# Patient Record
Sex: Female | Born: 1965 | Race: Black or African American | Hispanic: No | Marital: Single | Smoking: Former smoker
Health system: Southern US, Community
[De-identification: ages and names within clinical notes are randomized; demographics above are authoritative.]

## PROBLEM LIST (undated history)

## (undated) DIAGNOSIS — T7840XA Allergy, unspecified, initial encounter: Secondary | ICD-10-CM

## (undated) DIAGNOSIS — K219 Gastro-esophageal reflux disease without esophagitis: Secondary | ICD-10-CM

## (undated) DIAGNOSIS — E039 Hypothyroidism, unspecified: Secondary | ICD-10-CM

## (undated) DIAGNOSIS — D509 Iron deficiency anemia, unspecified: Secondary | ICD-10-CM

## (undated) DIAGNOSIS — J45909 Unspecified asthma, uncomplicated: Secondary | ICD-10-CM

## (undated) DIAGNOSIS — E119 Type 2 diabetes mellitus without complications: Secondary | ICD-10-CM

## (undated) DIAGNOSIS — M1990 Unspecified osteoarthritis, unspecified site: Secondary | ICD-10-CM

## (undated) DIAGNOSIS — D493 Neoplasm of unspecified behavior of breast: Secondary | ICD-10-CM

## (undated) DIAGNOSIS — K449 Diaphragmatic hernia without obstruction or gangrene: Secondary | ICD-10-CM

## (undated) DIAGNOSIS — E785 Hyperlipidemia, unspecified: Secondary | ICD-10-CM

## (undated) DIAGNOSIS — R079 Chest pain, unspecified: Secondary | ICD-10-CM

## (undated) DIAGNOSIS — I10 Essential (primary) hypertension: Secondary | ICD-10-CM

## (undated) DIAGNOSIS — G4730 Sleep apnea, unspecified: Secondary | ICD-10-CM

## (undated) DIAGNOSIS — K226 Gastro-esophageal laceration-hemorrhage syndrome: Secondary | ICD-10-CM

## (undated) HISTORY — DX: Hyperlipidemia, unspecified: E78.5

## (undated) HISTORY — DX: Iron deficiency anemia, unspecified: D50.9

## (undated) HISTORY — PX: UPPER GASTROINTESTINAL ENDOSCOPY: SHX188

## (undated) HISTORY — DX: Hypothyroidism, unspecified: E03.9

## (undated) HISTORY — DX: Sleep apnea, unspecified: G47.30

## (undated) HISTORY — DX: Unspecified osteoarthritis, unspecified site: M19.90

## (undated) HISTORY — DX: Allergy, unspecified, initial encounter: T78.40XA

## (undated) HISTORY — DX: Gastro-esophageal laceration-hemorrhage syndrome: K22.6

## (undated) HISTORY — PX: OTHER SURGICAL HISTORY: SHX169

## (undated) HISTORY — PX: COLONOSCOPY: SHX174

## (undated) HISTORY — DX: Diaphragmatic hernia without obstruction or gangrene: K44.9

## (undated) HISTORY — DX: Type 2 diabetes mellitus without complications: E11.9

## (undated) HISTORY — PX: THYROIDECTOMY: SHX17

## (undated) HISTORY — DX: Essential (primary) hypertension: I10

## (undated) HISTORY — DX: Unspecified asthma, uncomplicated: J45.909

## (undated) HISTORY — PX: FRACTURE SURGERY: SHX138

## (undated) HISTORY — PX: BREAST SURGERY: SHX581

## (undated) HISTORY — PX: REDUCTION MAMMAPLASTY: SUR839

## (undated) HISTORY — DX: Gastro-esophageal reflux disease without esophagitis: K21.9

---

## 1984-06-04 HISTORY — PX: BREAST REDUCTION SURGERY: SHX8

## 1985-06-04 HISTORY — PX: TUBAL LIGATION: SHX77

## 1997-09-25 ENCOUNTER — Emergency Department (HOSPITAL_COMMUNITY): Admission: EM | Admit: 1997-09-25 | Discharge: 1997-09-25 | Payer: Self-pay | Admitting: Emergency Medicine

## 1997-09-27 ENCOUNTER — Emergency Department (HOSPITAL_COMMUNITY): Admission: EM | Admit: 1997-09-27 | Discharge: 1997-09-27 | Payer: Self-pay | Admitting: Emergency Medicine

## 1998-09-13 ENCOUNTER — Ambulatory Visit (HOSPITAL_BASED_OUTPATIENT_CLINIC_OR_DEPARTMENT_OTHER): Admission: RE | Admit: 1998-09-13 | Discharge: 1998-09-13 | Payer: Self-pay | Admitting: Orthopedic Surgery

## 1999-05-05 ENCOUNTER — Encounter: Admission: RE | Admit: 1999-05-05 | Discharge: 1999-05-05 | Payer: Self-pay | Admitting: Family Medicine

## 1999-05-05 ENCOUNTER — Encounter: Payer: Self-pay | Admitting: Family Medicine

## 1999-06-05 HISTORY — PX: CARPAL TUNNEL RELEASE: SHX101

## 1999-06-09 ENCOUNTER — Encounter: Admission: RE | Admit: 1999-06-09 | Discharge: 1999-06-09 | Payer: Self-pay | Admitting: Family Medicine

## 1999-06-09 ENCOUNTER — Encounter: Payer: Self-pay | Admitting: Family Medicine

## 1999-08-29 ENCOUNTER — Encounter: Admission: RE | Admit: 1999-08-29 | Discharge: 1999-08-29 | Payer: Self-pay | Admitting: Internal Medicine

## 2000-04-02 ENCOUNTER — Encounter: Admission: RE | Admit: 2000-04-02 | Discharge: 2000-04-02 | Payer: Self-pay | Admitting: Obstetrics & Gynecology

## 2000-04-04 ENCOUNTER — Encounter: Payer: Self-pay | Admitting: Family Medicine

## 2000-04-04 ENCOUNTER — Ambulatory Visit (HOSPITAL_COMMUNITY): Admission: RE | Admit: 2000-04-04 | Discharge: 2000-04-04 | Payer: Self-pay | Admitting: Family Medicine

## 2000-06-25 ENCOUNTER — Encounter: Admission: RE | Admit: 2000-06-25 | Discharge: 2000-06-25 | Payer: Self-pay | Admitting: Obstetrics & Gynecology

## 2000-07-09 ENCOUNTER — Encounter: Admission: RE | Admit: 2000-07-09 | Discharge: 2000-07-09 | Payer: Self-pay | Admitting: Obstetrics & Gynecology

## 2000-07-30 ENCOUNTER — Encounter: Admission: RE | Admit: 2000-07-30 | Discharge: 2000-07-30 | Payer: Self-pay | Admitting: Obstetrics & Gynecology

## 2000-07-30 ENCOUNTER — Other Ambulatory Visit: Admission: RE | Admit: 2000-07-30 | Discharge: 2000-07-30 | Payer: Self-pay | Admitting: Obstetrics & Gynecology

## 2000-08-12 ENCOUNTER — Emergency Department (HOSPITAL_COMMUNITY): Admission: EM | Admit: 2000-08-12 | Discharge: 2000-08-12 | Payer: Self-pay

## 2000-08-12 ENCOUNTER — Encounter: Payer: Self-pay | Admitting: Emergency Medicine

## 2000-10-15 ENCOUNTER — Encounter: Admission: RE | Admit: 2000-10-15 | Discharge: 2000-10-15 | Payer: Self-pay | Admitting: Obstetrics & Gynecology

## 2000-12-31 ENCOUNTER — Ambulatory Visit (HOSPITAL_COMMUNITY): Admission: RE | Admit: 2000-12-31 | Discharge: 2000-12-31 | Payer: Self-pay | Admitting: Obstetrics and Gynecology

## 2000-12-31 ENCOUNTER — Encounter (INDEPENDENT_AMBULATORY_CARE_PROVIDER_SITE_OTHER): Payer: Self-pay

## 2002-03-13 ENCOUNTER — Encounter: Payer: Self-pay | Admitting: Family Medicine

## 2002-03-13 ENCOUNTER — Encounter: Admission: RE | Admit: 2002-03-13 | Discharge: 2002-03-13 | Payer: Self-pay | Admitting: Family Medicine

## 2003-02-20 ENCOUNTER — Emergency Department (HOSPITAL_COMMUNITY): Admission: EM | Admit: 2003-02-20 | Discharge: 2003-02-20 | Payer: Self-pay | Admitting: Emergency Medicine

## 2003-03-13 ENCOUNTER — Emergency Department (HOSPITAL_COMMUNITY): Admission: EM | Admit: 2003-03-13 | Discharge: 2003-03-13 | Payer: Self-pay

## 2003-03-25 ENCOUNTER — Other Ambulatory Visit: Admission: RE | Admit: 2003-03-25 | Discharge: 2003-03-25 | Payer: Self-pay | Admitting: Obstetrics and Gynecology

## 2003-03-25 ENCOUNTER — Encounter: Payer: Self-pay | Admitting: Internal Medicine

## 2003-03-25 LAB — CONVERTED CEMR LAB

## 2003-04-05 DIAGNOSIS — K226 Gastro-esophageal laceration-hemorrhage syndrome: Secondary | ICD-10-CM

## 2003-04-05 HISTORY — DX: Gastro-esophageal laceration-hemorrhage syndrome: K22.6

## 2003-09-16 ENCOUNTER — Emergency Department (HOSPITAL_COMMUNITY): Admission: EM | Admit: 2003-09-16 | Discharge: 2003-09-16 | Payer: Self-pay | Admitting: Emergency Medicine

## 2004-03-28 ENCOUNTER — Other Ambulatory Visit: Admission: RE | Admit: 2004-03-28 | Discharge: 2004-03-28 | Payer: Self-pay | Admitting: Obstetrics and Gynecology

## 2004-04-17 ENCOUNTER — Ambulatory Visit: Payer: Self-pay | Admitting: Oncology

## 2004-04-20 ENCOUNTER — Encounter: Admission: RE | Admit: 2004-04-20 | Discharge: 2004-04-20 | Payer: Self-pay | Admitting: Obstetrics and Gynecology

## 2004-05-04 ENCOUNTER — Ambulatory Visit: Payer: Self-pay | Admitting: Internal Medicine

## 2004-07-17 ENCOUNTER — Ambulatory Visit: Payer: Self-pay | Admitting: Oncology

## 2004-08-30 ENCOUNTER — Ambulatory Visit: Payer: Self-pay | Admitting: Internal Medicine

## 2004-09-29 ENCOUNTER — Ambulatory Visit: Payer: Self-pay | Admitting: Internal Medicine

## 2004-10-28 ENCOUNTER — Emergency Department (HOSPITAL_COMMUNITY): Admission: EM | Admit: 2004-10-28 | Discharge: 2004-10-28 | Payer: Self-pay | Admitting: Emergency Medicine

## 2004-11-06 ENCOUNTER — Ambulatory Visit: Payer: Self-pay | Admitting: Internal Medicine

## 2004-11-15 ENCOUNTER — Ambulatory Visit: Payer: Self-pay | Admitting: *Deleted

## 2004-12-25 ENCOUNTER — Emergency Department (HOSPITAL_COMMUNITY): Admission: EM | Admit: 2004-12-25 | Discharge: 2004-12-25 | Payer: Self-pay | Admitting: Emergency Medicine

## 2005-04-04 ENCOUNTER — Inpatient Hospital Stay (HOSPITAL_COMMUNITY): Admission: AD | Admit: 2005-04-04 | Discharge: 2005-04-04 | Payer: Self-pay | Admitting: Obstetrics and Gynecology

## 2005-04-24 ENCOUNTER — Other Ambulatory Visit: Admission: RE | Admit: 2005-04-24 | Discharge: 2005-04-24 | Payer: Self-pay | Admitting: Obstetrics and Gynecology

## 2005-11-26 ENCOUNTER — Ambulatory Visit: Payer: Self-pay | Admitting: Internal Medicine

## 2006-03-25 ENCOUNTER — Ambulatory Visit: Payer: Self-pay | Admitting: Internal Medicine

## 2006-05-27 ENCOUNTER — Ambulatory Visit: Payer: Self-pay | Admitting: Internal Medicine

## 2006-05-30 ENCOUNTER — Encounter: Admission: RE | Admit: 2006-05-30 | Discharge: 2006-05-30 | Payer: Self-pay | Admitting: Obstetrics and Gynecology

## 2006-08-29 ENCOUNTER — Ambulatory Visit: Payer: Self-pay | Admitting: Internal Medicine

## 2006-08-29 LAB — CONVERTED CEMR LAB
ALT: 12 units/L (ref 0–40)
AST: 17 units/L (ref 0–37)
Albumin: 3.5 g/dL (ref 3.5–5.2)
Alkaline Phosphatase: 44 units/L (ref 39–117)
BUN: 14 mg/dL (ref 6–23)
Basophils Absolute: 0 10*3/uL (ref 0.0–0.1)
Basophils Relative: 0.8 % (ref 0.0–1.0)
Bilirubin Urine: NEGATIVE
Bilirubin, Direct: 0.1 mg/dL (ref 0.0–0.3)
CO2: 25 meq/L (ref 19–32)
Calcium: 8.5 mg/dL (ref 8.4–10.5)
Chloride: 107 meq/L (ref 96–112)
Cholesterol: 219 mg/dL (ref 0–200)
Creatinine, Ser: 0.7 mg/dL (ref 0.4–1.2)
Direct LDL: 165.4 mg/dL
Eosinophils Absolute: 0 10*3/uL (ref 0.0–0.6)
Eosinophils Relative: 1.1 % (ref 0.0–5.0)
Folate: 7.6 ng/mL
GFR calc Af Amer: 118 mL/min
GFR calc non Af Amer: 98 mL/min
Glucose, Bld: 95 mg/dL (ref 70–99)
H Pylori IgG: NEGATIVE
HCT: 24.1 % — ABNORMAL LOW (ref 36.0–46.0)
HDL: 41 mg/dL (ref >39.0)
Hemoglobin: 7.3 g/dL — CL (ref 12.0–15.0)
Iron: 24 ug/dL — ABNORMAL LOW (ref 42–145)
Ketones, ur: NEGATIVE mg/dL
Leukocytes, UA: NEGATIVE
Lymphocytes Relative: 38.7 % (ref 12.0–46.0)
MCHC: 30.1 g/dL (ref 30.0–36.0)
MCV: 63.6 fL — ABNORMAL LOW (ref 78.0–100.0)
Monocytes Absolute: 0.3 10*3/uL (ref 0.2–0.7)
Monocytes Relative: 6.8 % (ref 3.0–11.0)
Neutro Abs: 2.5 10*3/uL (ref 1.4–7.7)
Neutrophils Relative %: 52.6 % (ref 43.0–77.0)
Nitrite: NEGATIVE
Platelets: 192 10*3/uL (ref 150–400)
Potassium: 3.8 meq/L (ref 3.5–5.1)
RBC: 3.78 M/uL — ABNORMAL LOW (ref 3.87–5.11)
RDW: 17.6 % — ABNORMAL HIGH (ref 11.5–14.6)
Saturation Ratios: 4.1 % — ABNORMAL LOW (ref 20.0–50.0)
Sodium: 137 meq/L (ref 135–145)
Specific Gravity, Urine: >=1.03 (ref 1.000–1.03)
TSH: 12.07 microintl units/mL — ABNORMAL HIGH (ref 0.35–5.50)
Total Bilirubin: 0.6 mg/dL (ref 0.3–1.2)
Total CHOL/HDL Ratio: 5.3
Total Protein, Urine: NEGATIVE mg/dL
Total Protein: 7.7 g/dL (ref 6.0–8.3)
Transferrin: 422.8 mg/dL — ABNORMAL HIGH (ref >=212.0)
Triglycerides: 76 mg/dL (ref 0–149)
Urine Glucose: NEGATIVE mg/dL
Urobilinogen, UA: 0.2 (ref 0.0–1.0)
VLDL: 15 mg/dL (ref 0–40)
Vitamin B-12: 834 pg/mL (ref 211–911)
WBC: 4.5 10*3/uL (ref 4.5–10.5)
pH: 5.5 (ref 5.0–8.0)

## 2006-10-01 ENCOUNTER — Ambulatory Visit: Payer: Self-pay | Admitting: Internal Medicine

## 2006-10-01 LAB — CONVERTED CEMR LAB
Basophils Absolute: 0.1 10*3/uL (ref 0.0–0.1)
Basophils Relative: 3.2 % — ABNORMAL HIGH (ref 0.0–1.0)
Eosinophils Absolute: 0.1 10*3/uL (ref 0.0–0.6)
Eosinophils Relative: 1.4 % (ref 0.0–5.0)
HCT: 29.8 % — ABNORMAL LOW (ref 36.0–46.0)
Hemoglobin: 9.2 g/dL — ABNORMAL LOW (ref 12.0–15.0)
Iron: 43 ug/dL (ref 42–145)
Lymphocytes Relative: 41.2 % (ref 12.0–46.0)
MCHC: 31 g/dL (ref 30.0–36.0)
MCV: 68.3 fL — ABNORMAL LOW (ref 78.0–100.0)
Monocytes Absolute: 0.3 10*3/uL (ref 0.2–0.7)
Monocytes Relative: 7.1 % (ref 3.0–11.0)
Neutro Abs: 2 10*3/uL (ref 1.4–7.7)
Neutrophils Relative %: 47.1 % (ref 43.0–77.0)
Platelets: 227 10*3/uL (ref 150–400)
RBC: 4.36 M/uL (ref 3.87–5.11)
RDW: 24.5 % — ABNORMAL HIGH (ref 11.5–14.6)
Saturation Ratios: 8.8 % — ABNORMAL LOW (ref 20.0–50.0)
Transferrin: 347.3 mg/dL (ref >=212.0)
WBC: 4.3 10*3/uL — ABNORMAL LOW (ref 4.5–10.5)

## 2007-01-30 ENCOUNTER — Encounter: Payer: Self-pay | Admitting: Internal Medicine

## 2007-01-30 DIAGNOSIS — D509 Iron deficiency anemia, unspecified: Secondary | ICD-10-CM

## 2007-01-30 DIAGNOSIS — E039 Hypothyroidism, unspecified: Secondary | ICD-10-CM

## 2007-01-30 DIAGNOSIS — J45909 Unspecified asthma, uncomplicated: Secondary | ICD-10-CM | POA: Insufficient documentation

## 2007-01-30 DIAGNOSIS — E785 Hyperlipidemia, unspecified: Secondary | ICD-10-CM

## 2007-01-30 HISTORY — DX: Iron deficiency anemia, unspecified: D50.9

## 2007-01-30 HISTORY — DX: Hypothyroidism, unspecified: E03.9

## 2007-01-30 HISTORY — DX: Unspecified asthma, uncomplicated: J45.909

## 2007-01-31 DIAGNOSIS — E0590 Thyrotoxicosis, unspecified without thyrotoxic crisis or storm: Secondary | ICD-10-CM | POA: Insufficient documentation

## 2007-01-31 DIAGNOSIS — K219 Gastro-esophageal reflux disease without esophagitis: Secondary | ICD-10-CM

## 2007-01-31 HISTORY — DX: Gastro-esophageal reflux disease without esophagitis: K21.9

## 2007-02-08 ENCOUNTER — Ambulatory Visit: Payer: Self-pay | Admitting: Family Medicine

## 2007-02-12 ENCOUNTER — Ambulatory Visit: Payer: Self-pay | Admitting: Internal Medicine

## 2007-02-12 LAB — CONVERTED CEMR LAB
Basophils Absolute: 0 10*3/uL (ref 0.0–0.1)
Basophils Relative: 0.5 % (ref 0.0–1.0)
Eosinophils Absolute: 0.1 10*3/uL (ref 0.0–0.6)
Eosinophils Relative: 1.3 % (ref 0.0–5.0)
Folate: 8.7 ng/mL
HCT: 25.9 % — ABNORMAL LOW (ref 36.0–46.0)
Hemoglobin: 8.2 g/dL — ABNORMAL LOW (ref 12.0–15.0)
Iron: 22 ug/dL — ABNORMAL LOW (ref 42–145)
Lymphocytes Relative: 36.9 % (ref 12.0–46.0)
MCHC: 31.7 g/dL (ref 30.0–36.0)
MCV: 68.6 fL — ABNORMAL LOW (ref 78.0–100.0)
Monocytes Absolute: 0.5 10*3/uL (ref 0.2–0.7)
Monocytes Relative: 9.9 % (ref 3.0–11.0)
Neutro Abs: 2.4 10*3/uL (ref 1.4–7.7)
Neutrophils Relative %: 51.4 % (ref 43.0–77.0)
Platelets: 221 10*3/uL (ref 150–400)
RBC: 3.78 M/uL — ABNORMAL LOW (ref 3.87–5.11)
RDW: 17.1 % — ABNORMAL HIGH (ref 11.5–14.6)
Saturation Ratios: 4.3 % — ABNORMAL LOW (ref 20.0–50.0)
TSH: 6.27 microintl units/mL — ABNORMAL HIGH (ref 0.35–5.50)
Transferrin: 369.5 mg/dL — ABNORMAL HIGH (ref >=212.0)
Vitamin B-12: 576 pg/mL (ref 211–911)
WBC: 4.8 10*3/uL (ref 4.5–10.5)

## 2007-07-31 ENCOUNTER — Emergency Department (HOSPITAL_COMMUNITY): Admission: EM | Admit: 2007-07-31 | Discharge: 2007-07-31 | Payer: Self-pay | Admitting: Emergency Medicine

## 2007-08-26 ENCOUNTER — Telehealth: Payer: Self-pay | Admitting: Internal Medicine

## 2007-09-11 ENCOUNTER — Ambulatory Visit: Payer: Self-pay | Admitting: Oncology

## 2007-09-17 ENCOUNTER — Encounter: Payer: Self-pay | Admitting: Internal Medicine

## 2007-09-17 LAB — CBC WITH DIFFERENTIAL/PLATELET
BASO%: 0.1 % (ref 0.0–2.0)
Basophils Absolute: 0 10*3/uL (ref 0.0–0.1)
EOS%: 1.5 % (ref 0.0–7.0)
Eosinophils Absolute: 0.1 10*3/uL (ref 0.0–0.5)
HCT: 27.6 % — ABNORMAL LOW (ref 34.8–46.6)
HGB: 9 g/dL — ABNORMAL LOW (ref 11.6–15.9)
LYMPH%: 36.7 % (ref 14.0–48.0)
MCH: 22.8 pg — ABNORMAL LOW (ref 26.0–34.0)
MCHC: 32.7 g/dL (ref 32.0–36.0)
MCV: 69.5 fL — ABNORMAL LOW (ref 81.0–101.0)
MONO#: 0.3 10*3/uL (ref 0.1–0.9)
MONO%: 7.5 % (ref 0.0–13.0)
NEUT#: 2.2 10*3/uL (ref 1.5–6.5)
NEUT%: 54.2 % (ref 39.6–76.8)
Platelets: 297 10*3/uL (ref 145–400)
RBC: 3.97 10*6/uL (ref 3.70–5.32)
RDW: 16.1 % — ABNORMAL HIGH (ref 11.3–14.5)
WBC: 4 10*3/uL (ref 3.9–10.0)
lymph#: 1.5 10*3/uL (ref 0.9–3.3)

## 2007-09-17 LAB — COMPREHENSIVE METABOLIC PANEL
Alkaline Phosphatase: 51 U/L (ref 39–117)
BUN: 13 mg/dL (ref 6–23)
Glucose, Bld: 112 mg/dL — ABNORMAL HIGH (ref 70–99)
Total Bilirubin: 0.2 mg/dL — ABNORMAL LOW (ref 0.3–1.2)

## 2007-09-17 LAB — IRON AND TIBC
%SAT: 3 % — ABNORMAL LOW (ref 20–55)
Iron: 11 ug/dL — ABNORMAL LOW (ref 42–145)
TIBC: 440 ug/dL (ref 250–470)
UIBC: 429 ug/dL

## 2007-09-17 LAB — FERRITIN: Ferritin: 14 ng/mL (ref 10–291)

## 2007-11-11 ENCOUNTER — Ambulatory Visit: Payer: Self-pay | Admitting: Oncology

## 2007-11-21 ENCOUNTER — Encounter: Payer: Self-pay | Admitting: Internal Medicine

## 2007-11-21 LAB — IRON AND TIBC
%SAT: 6 % — ABNORMAL LOW (ref 20–55)
Iron: 21 ug/dL — ABNORMAL LOW (ref 42–145)
UIBC: 351 ug/dL

## 2007-11-21 LAB — CBC WITH DIFFERENTIAL/PLATELET
BASO%: 1.1 % (ref 0.0–2.0)
Eosinophils Absolute: 0 10*3/uL (ref 0.0–0.5)
MCHC: 32.6 g/dL (ref 32.0–36.0)
MONO#: 0.4 10*3/uL (ref 0.1–0.9)
NEUT#: 1.8 10*3/uL (ref 1.5–6.5)
RBC: 4.1 10*6/uL (ref 3.70–5.32)
RDW: 17.6 % — ABNORMAL HIGH (ref 11.3–14.5)
WBC: 4.3 10*3/uL (ref 3.9–10.0)
lymph#: 2 10*3/uL (ref 0.9–3.3)

## 2007-11-21 LAB — COMPREHENSIVE METABOLIC PANEL
AST: 19 U/L (ref 0–37)
Alkaline Phosphatase: 55 U/L (ref 39–117)
Glucose, Bld: 81 mg/dL (ref 70–99)
Potassium: 4.5 mEq/L (ref 3.5–5.3)
Sodium: 140 mEq/L (ref 135–145)
Total Bilirubin: 0.2 mg/dL — ABNORMAL LOW (ref 0.3–1.2)
Total Protein: 7.8 g/dL (ref 6.0–8.3)

## 2007-12-17 ENCOUNTER — Ambulatory Visit: Payer: Self-pay | Admitting: Oncology

## 2008-01-13 ENCOUNTER — Encounter: Payer: Self-pay | Admitting: Internal Medicine

## 2008-03-18 ENCOUNTER — Ambulatory Visit: Payer: Self-pay | Admitting: Oncology

## 2008-03-22 ENCOUNTER — Encounter: Payer: Self-pay | Admitting: Internal Medicine

## 2008-03-22 LAB — CBC WITH DIFFERENTIAL/PLATELET
Basophils Absolute: 0 10*3/uL (ref 0.0–0.1)
EOS%: 1.2 % (ref 0.0–7.0)
Eosinophils Absolute: 0.1 10*3/uL (ref 0.0–0.5)
HGB: 11.4 g/dL — ABNORMAL LOW (ref 11.6–15.9)
MCH: 28.9 pg (ref 26.0–34.0)
RDW: 13.7 % (ref 11.3–14.5)
WBC: 4.7 10*3/uL (ref 3.9–10.0)
lymph#: 1.8 10*3/uL (ref 0.9–3.3)

## 2008-03-29 LAB — COMPREHENSIVE METABOLIC PANEL
ALT: 15 U/L (ref 0–35)
AST: 17 U/L (ref 0–37)
Albumin: 4.1 g/dL (ref 3.5–5.2)
Alkaline Phosphatase: 49 U/L (ref 39–117)
Glucose, Bld: 94 mg/dL (ref 70–99)
Potassium: 4.2 mEq/L (ref 3.5–5.3)
Sodium: 139 mEq/L (ref 135–145)
Total Protein: 7.3 g/dL (ref 6.0–8.3)

## 2008-03-29 LAB — VON WILLEBRAND PANEL
Factor-VIII Activity: 152 % — ABNORMAL HIGH (ref 50–150)
Ristocetin-Cofactor: 122 % (ref 50–150)
Von Willebrand Ag: 128 % normal (ref 61–164)

## 2008-03-29 LAB — IRON AND TIBC: Iron: 75 ug/dL (ref 42–145)

## 2008-03-29 LAB — LACTATE DEHYDROGENASE: LDH: 180 U/L (ref 94–250)

## 2008-05-19 ENCOUNTER — Ambulatory Visit: Payer: Self-pay | Admitting: Oncology

## 2008-05-21 LAB — CBC WITH DIFFERENTIAL/PLATELET
Basophils Absolute: 0 10*3/uL (ref 0.0–0.1)
Eosinophils Absolute: 0 10*3/uL (ref 0.0–0.5)
HCT: 35.6 % (ref 34.8–46.6)
HGB: 12.1 g/dL (ref 11.6–15.9)
MCH: 29.3 pg (ref 26.0–34.0)
MONO#: 0.2 10*3/uL (ref 0.1–0.9)
NEUT#: 2.2 10*3/uL (ref 1.5–6.5)
NEUT%: 54.7 % (ref 39.6–76.8)
RDW: 12.7 % (ref 11.3–14.5)
lymph#: 1.6 10*3/uL (ref 0.9–3.3)

## 2008-05-21 LAB — IRON AND TIBC
Iron: 56 ug/dL (ref 42–145)
UIBC: 262 ug/dL

## 2008-05-21 LAB — COMPREHENSIVE METABOLIC PANEL
AST: 16 U/L (ref 0–37)
Albumin: 4.2 g/dL (ref 3.5–5.2)
BUN: 12 mg/dL (ref 6–23)
CO2: 24 mEq/L (ref 19–32)
Calcium: 8.6 mg/dL (ref 8.4–10.5)
Chloride: 102 mEq/L (ref 96–112)
Creatinine, Ser: 0.79 mg/dL (ref 0.40–1.20)
Glucose, Bld: 101 mg/dL — ABNORMAL HIGH (ref 70–99)
Potassium: 3.7 mEq/L (ref 3.5–5.3)

## 2008-05-21 LAB — FERRITIN: Ferritin: 347 ng/mL — ABNORMAL HIGH (ref 10–291)

## 2008-05-21 LAB — LACTATE DEHYDROGENASE: LDH: 174 U/L (ref 94–250)

## 2008-07-22 ENCOUNTER — Ambulatory Visit: Payer: Self-pay | Admitting: Oncology

## 2008-09-25 ENCOUNTER — Ambulatory Visit: Payer: Self-pay | Admitting: Family Medicine

## 2008-09-25 ENCOUNTER — Telehealth: Payer: Self-pay | Admitting: Family Medicine

## 2008-09-25 DIAGNOSIS — J0100 Acute maxillary sinusitis, unspecified: Secondary | ICD-10-CM

## 2009-01-29 ENCOUNTER — Telehealth: Payer: Self-pay | Admitting: Internal Medicine

## 2009-01-29 ENCOUNTER — Ambulatory Visit: Payer: Self-pay | Admitting: Internal Medicine

## 2009-01-29 ENCOUNTER — Emergency Department (HOSPITAL_COMMUNITY): Admission: EM | Admit: 2009-01-29 | Discharge: 2009-01-29 | Payer: Self-pay | Admitting: Emergency Medicine

## 2009-01-29 DIAGNOSIS — R079 Chest pain, unspecified: Secondary | ICD-10-CM

## 2009-01-29 HISTORY — DX: Chest pain, unspecified: R07.9

## 2009-04-21 ENCOUNTER — Ambulatory Visit: Payer: Self-pay | Admitting: Oncology

## 2009-04-25 ENCOUNTER — Encounter: Payer: Self-pay | Admitting: Internal Medicine

## 2009-04-25 LAB — COMPREHENSIVE METABOLIC PANEL
ALT: 19 U/L (ref 0–35)
Albumin: 3.5 g/dL (ref 3.5–5.2)
Alkaline Phosphatase: 66 U/L (ref 39–117)
CO2: 27 mEq/L (ref 19–32)
Glucose, Bld: 117 mg/dL — ABNORMAL HIGH (ref 70–99)
Potassium: 3.9 mEq/L (ref 3.5–5.3)
Sodium: 136 mEq/L (ref 135–145)
Total Bilirubin: 0.2 mg/dL — ABNORMAL LOW (ref 0.3–1.2)
Total Protein: 7.5 g/dL (ref 6.0–8.3)

## 2009-04-25 LAB — FERRITIN: Ferritin: 20 ng/mL (ref 10–291)

## 2009-04-25 LAB — CBC WITH DIFFERENTIAL/PLATELET
BASO%: 0.5 % (ref 0.0–2.0)
Eosinophils Absolute: 0.1 10*3/uL (ref 0.0–0.5)
MCHC: 32.3 g/dL (ref 31.5–36.0)
MONO#: 0.4 10*3/uL (ref 0.1–0.9)
MONO%: 8.5 % (ref 0.0–14.0)
NEUT#: 2.1 10*3/uL (ref 1.5–6.5)
RBC: 3.81 10*6/uL (ref 3.70–5.45)
RDW: 14.6 % — ABNORMAL HIGH (ref 11.2–14.5)
WBC: 5.1 10*3/uL (ref 3.9–10.3)

## 2009-04-25 LAB — IRON AND TIBC
%SAT: 5 % — ABNORMAL LOW (ref 20–55)
Iron: 22 ug/dL — ABNORMAL LOW (ref 42–145)

## 2009-05-17 ENCOUNTER — Encounter: Payer: Self-pay | Admitting: Internal Medicine

## 2009-05-17 LAB — COMPREHENSIVE METABOLIC PANEL
ALT: 17 U/L (ref 0–35)
AST: 18 U/L (ref 0–37)
Alkaline Phosphatase: 63 U/L (ref 39–117)
Chloride: 104 mEq/L (ref 96–112)
Creatinine, Ser: 0.75 mg/dL (ref 0.40–1.20)
Total Bilirubin: 0.2 mg/dL — ABNORMAL LOW (ref 0.3–1.2)

## 2009-05-17 LAB — CBC WITH DIFFERENTIAL/PLATELET
BASO%: 0.5 % (ref 0.0–2.0)
EOS%: 0.9 % (ref 0.0–7.0)
HCT: 34.4 % — ABNORMAL LOW (ref 34.8–46.6)
LYMPH%: 37 % (ref 14.0–49.7)
MCH: 27 pg (ref 25.1–34.0)
MCHC: 33.2 g/dL (ref 31.5–36.0)
MCV: 81.4 fL (ref 79.5–101.0)
MONO%: 8.2 % (ref 0.0–14.0)
NEUT%: 53.4 % (ref 38.4–76.8)
lymph#: 1.8 10*3/uL (ref 0.9–3.3)

## 2009-05-17 LAB — IRON AND TIBC
%SAT: 37 % (ref 20–55)
Iron: 114 ug/dL (ref 42–145)
TIBC: 310 ug/dL (ref 250–470)
UIBC: 196 ug/dL

## 2009-06-28 ENCOUNTER — Ambulatory Visit: Payer: Self-pay | Admitting: Oncology

## 2009-06-30 LAB — CBC WITH DIFFERENTIAL/PLATELET
BASO%: 0.3 % (ref 0.0–2.0)
Basophils Absolute: 0 10*3/uL (ref 0.0–0.1)
EOS%: 1.2 % (ref 0.0–7.0)
Eosinophils Absolute: 0.1 10*3/uL (ref 0.0–0.5)
HCT: 35.1 % (ref 34.8–46.6)
HGB: 11.8 g/dL (ref 11.6–15.9)
LYMPH%: 44.6 % (ref 14.0–49.7)
MCH: 28.2 pg (ref 25.1–34.0)
MCHC: 33.5 g/dL (ref 31.5–36.0)
MCV: 84.1 fL (ref 79.5–101.0)
MONO#: 0.5 10*3/uL (ref 0.1–0.9)
MONO%: 9.7 % (ref 0.0–14.0)
NEUT#: 2.1 10*3/uL (ref 1.5–6.5)
NEUT%: 44.2 % (ref 38.4–76.8)
Platelets: 180 10*3/uL (ref 145–400)
RBC: 4.17 10*6/uL (ref 3.70–5.45)
RDW: 20.6 % — ABNORMAL HIGH (ref 11.2–14.5)
WBC: 4.7 10*3/uL (ref 3.9–10.3)
lymph#: 2.1 10*3/uL (ref 0.9–3.3)

## 2009-06-30 LAB — FERRITIN: Ferritin: 724 ng/mL — ABNORMAL HIGH (ref 10–291)

## 2009-06-30 LAB — IRON AND TIBC
%SAT: 25 % (ref 20–55)
Iron: 80 ug/dL (ref 42–145)
TIBC: 323 ug/dL (ref 250–470)
UIBC: 243 ug/dL

## 2009-07-04 ENCOUNTER — Encounter: Payer: Self-pay | Admitting: Internal Medicine

## 2009-07-28 ENCOUNTER — Ambulatory Visit: Payer: Self-pay | Admitting: Oncology

## 2009-08-02 LAB — IRON AND TIBC
%SAT: 16 % — ABNORMAL LOW (ref 20–55)
Iron: 50 ug/dL (ref 42–145)
TIBC: 308 ug/dL (ref 250–470)
UIBC: 258 ug/dL

## 2009-08-02 LAB — CBC WITH DIFFERENTIAL/PLATELET
BASO%: 0.5 % (ref 0.0–2.0)
Basophils Absolute: 0 10*3/uL (ref 0.0–0.1)
EOS%: 1.9 % (ref 0.0–7.0)
MCH: 29.3 pg (ref 25.1–34.0)
MCHC: 34.2 g/dL (ref 31.5–36.0)
MCV: 85.5 fL (ref 79.5–101.0)
MONO%: 6.3 % (ref 0.0–14.0)
RBC: 3.86 10*6/uL (ref 3.70–5.45)
RDW: 16.2 % — ABNORMAL HIGH (ref 11.2–14.5)

## 2009-08-02 LAB — COMPREHENSIVE METABOLIC PANEL
Alkaline Phosphatase: 48 U/L (ref 39–117)
CO2: 22 mEq/L (ref 19–32)
Creatinine, Ser: 0.85 mg/dL (ref 0.40–1.20)
Glucose, Bld: 98 mg/dL (ref 70–99)
Total Bilirubin: 0.2 mg/dL — ABNORMAL LOW (ref 0.3–1.2)

## 2009-08-02 LAB — FERRITIN: Ferritin: 380 ng/mL — ABNORMAL HIGH (ref 10–291)

## 2009-08-02 LAB — LACTATE DEHYDROGENASE: LDH: 186 U/L (ref 94–250)

## 2009-08-04 ENCOUNTER — Encounter: Payer: Self-pay | Admitting: Internal Medicine

## 2009-08-20 ENCOUNTER — Emergency Department (HOSPITAL_COMMUNITY): Admission: EM | Admit: 2009-08-20 | Discharge: 2009-08-20 | Payer: Self-pay | Admitting: Emergency Medicine

## 2009-08-22 ENCOUNTER — Telehealth: Payer: Self-pay | Admitting: Internal Medicine

## 2009-08-30 ENCOUNTER — Ambulatory Visit: Payer: Self-pay | Admitting: Oncology

## 2009-09-08 LAB — CBC WITH DIFFERENTIAL/PLATELET
BASO%: 0.3 % (ref 0.0–2.0)
EOS%: 1 % (ref 0.0–7.0)
Eosinophils Absolute: 0 10*3/uL (ref 0.0–0.5)
LYMPH%: 45.1 % (ref 14.0–49.7)
MCHC: 33.8 g/dL (ref 31.5–36.0)
MCV: 87 fL (ref 79.5–101.0)
MONO%: 8.2 % (ref 0.0–14.0)
NEUT#: 2.3 10*3/uL (ref 1.5–6.5)
Platelets: 226 10*3/uL (ref 145–400)
RBC: 3.9 10*6/uL (ref 3.70–5.45)
RDW: 12.4 % (ref 11.2–14.5)

## 2009-09-25 ENCOUNTER — Emergency Department (HOSPITAL_COMMUNITY): Admission: EM | Admit: 2009-09-25 | Discharge: 2009-09-25 | Payer: Self-pay | Admitting: Emergency Medicine

## 2009-09-28 ENCOUNTER — Encounter: Payer: Self-pay | Admitting: Internal Medicine

## 2009-09-29 ENCOUNTER — Ambulatory Visit: Payer: Self-pay | Admitting: Oncology

## 2009-09-29 LAB — CBC WITH DIFFERENTIAL/PLATELET
Basophils Absolute: 0 10*3/uL (ref 0.0–0.1)
EOS%: 1.4 % (ref 0.0–7.0)
Eosinophils Absolute: 0.1 10*3/uL (ref 0.0–0.5)
HGB: 12.7 g/dL (ref 11.6–15.9)
LYMPH%: 40.3 % (ref 14.0–49.7)
MCH: 28.1 pg (ref 25.1–34.0)
MCV: 84.3 fL (ref 79.5–101.0)
MONO%: 11.1 % (ref 0.0–14.0)
NEUT#: 2.6 10*3/uL (ref 1.5–6.5)
Platelets: 229 10*3/uL (ref 145–400)
RBC: 4.52 10*6/uL (ref 3.70–5.45)
RDW: 12.1 % (ref 11.2–14.5)

## 2009-09-29 LAB — IRON AND TIBC
TIBC: 327 ug/dL (ref 250–470)
UIBC: 271 ug/dL

## 2009-09-29 LAB — FERRITIN: Ferritin: 410 ng/mL — ABNORMAL HIGH (ref 10–291)

## 2009-09-29 LAB — COMPREHENSIVE METABOLIC PANEL
ALT: 12 U/L (ref 0–35)
BUN: 10 mg/dL (ref 6–23)
CO2: 27 mEq/L (ref 19–32)
Calcium: 9.4 mg/dL (ref 8.4–10.5)
Chloride: 99 mEq/L (ref 96–112)
Creatinine, Ser: 1.06 mg/dL (ref 0.40–1.20)
Glucose, Bld: 108 mg/dL — ABNORMAL HIGH (ref 70–99)
Total Bilirubin: 0.3 mg/dL (ref 0.3–1.2)

## 2009-09-29 LAB — LACTATE DEHYDROGENASE: LDH: 131 U/L (ref 94–250)

## 2009-11-15 ENCOUNTER — Telehealth: Payer: Self-pay | Admitting: Internal Medicine

## 2009-11-28 ENCOUNTER — Ambulatory Visit: Payer: Self-pay | Admitting: Oncology

## 2009-12-21 ENCOUNTER — Ambulatory Visit: Payer: Self-pay | Admitting: Internal Medicine

## 2009-12-22 ENCOUNTER — Ambulatory Visit: Payer: Self-pay | Admitting: Internal Medicine

## 2009-12-22 DIAGNOSIS — H6690 Otitis media, unspecified, unspecified ear: Secondary | ICD-10-CM | POA: Insufficient documentation

## 2009-12-22 DIAGNOSIS — N951 Menopausal and female climacteric states: Secondary | ICD-10-CM

## 2009-12-22 DIAGNOSIS — R0609 Other forms of dyspnea: Secondary | ICD-10-CM | POA: Insufficient documentation

## 2009-12-22 DIAGNOSIS — R0989 Other specified symptoms and signs involving the circulatory and respiratory systems: Secondary | ICD-10-CM

## 2009-12-22 LAB — CONVERTED CEMR LAB
ALT: 16 units/L (ref 0–35)
AST: 18 units/L (ref 0–37)
Albumin: 3.8 g/dL (ref 3.5–5.2)
Alkaline Phosphatase: 53 units/L (ref 39–117)
BUN: 18 mg/dL (ref 6–23)
Basophils Absolute: 0 10*3/uL (ref 0.0–0.1)
Basophils Relative: 0.4 % (ref 0.0–3.0)
Bilirubin Urine: NEGATIVE
Bilirubin, Direct: 0.1 mg/dL (ref 0.0–0.3)
CO2: 26 meq/L (ref 19–32)
Calcium: 8.6 mg/dL (ref 8.4–10.5)
Chloride: 104 meq/L (ref 96–112)
Cholesterol: 232 mg/dL — ABNORMAL HIGH (ref 0–200)
Creatinine, Ser: 0.6 mg/dL (ref 0.4–1.2)
Direct LDL: 160.6 mg/dL
Eosinophils Absolute: 0 10*3/uL (ref 0.0–0.7)
Eosinophils Relative: 0.7 % (ref 0.0–5.0)
FSH: 27 milliintl units/mL
GFR calc non Af Amer: 128.45 mL/min (ref >60)
Glucose, Bld: 78 mg/dL (ref 70–99)
HCT: 36.3 % (ref 36.0–46.0)
HDL: 40.8 mg/dL (ref >39.00)
Hemoglobin: 12.1 g/dL (ref 12.0–15.0)
Ketones, ur: NEGATIVE mg/dL
Leukocytes, UA: NEGATIVE
Lymphocytes Relative: 38.9 % (ref 12.0–46.0)
Lymphs Abs: 2.4 10*3/uL (ref 0.7–4.0)
MCHC: 33.4 g/dL (ref 30.0–36.0)
MCV: 84 fL (ref 78.0–100.0)
Monocytes Absolute: 0.6 10*3/uL (ref 0.1–1.0)
Monocytes Relative: 9.3 % (ref 3.0–12.0)
Neutro Abs: 3.1 10*3/uL (ref 1.4–7.7)
Neutrophils Relative %: 50.7 % (ref 43.0–77.0)
Nitrite: NEGATIVE
Platelets: 216 10*3/uL (ref 150.0–400.0)
Potassium: 4.2 meq/L (ref 3.5–5.1)
RBC: 4.32 M/uL (ref 3.87–5.11)
RDW: 13.8 % (ref 11.5–14.6)
Sodium: 137 meq/L (ref 135–145)
Specific Gravity, Urine: 1.025 (ref 1.000–1.030)
TSH: 0.51 microintl units/mL (ref 0.35–5.50)
Total Bilirubin: 0.3 mg/dL (ref 0.3–1.2)
Total CHOL/HDL Ratio: 6
Total Protein, Urine: NEGATIVE mg/dL
Total Protein: 7.6 g/dL (ref 6.0–8.3)
Triglycerides: 221 mg/dL — ABNORMAL HIGH (ref 0.0–149.0)
Urine Glucose: NEGATIVE mg/dL
Urobilinogen, UA: 0.2 (ref 0.0–1.0)
VLDL: 44.2 mg/dL — ABNORMAL HIGH (ref 0.0–40.0)
WBC: 6.1 10*3/uL (ref 4.5–10.5)
pH: 6 (ref 5.0–8.0)

## 2009-12-23 ENCOUNTER — Telehealth (INDEPENDENT_AMBULATORY_CARE_PROVIDER_SITE_OTHER): Payer: Self-pay | Admitting: *Deleted

## 2009-12-24 LAB — CONVERTED CEMR LAB
Chlamydia, Swab/Urine, PCR: NEGATIVE
GC Probe Amp, Urine: NEGATIVE

## 2009-12-27 ENCOUNTER — Encounter (INDEPENDENT_AMBULATORY_CARE_PROVIDER_SITE_OTHER): Payer: Self-pay | Admitting: *Deleted

## 2009-12-27 ENCOUNTER — Ambulatory Visit: Payer: Self-pay | Admitting: Cardiovascular Disease

## 2009-12-27 ENCOUNTER — Ambulatory Visit: Payer: Self-pay

## 2009-12-27 ENCOUNTER — Ambulatory Visit (HOSPITAL_COMMUNITY): Admission: RE | Admit: 2009-12-27 | Discharge: 2009-12-27 | Payer: Self-pay | Admitting: Internal Medicine

## 2009-12-27 ENCOUNTER — Encounter: Payer: Self-pay | Admitting: Internal Medicine

## 2009-12-29 ENCOUNTER — Ambulatory Visit: Payer: Self-pay | Admitting: Oncology

## 2010-01-02 ENCOUNTER — Encounter: Payer: Self-pay | Admitting: Internal Medicine

## 2010-01-02 LAB — FERRITIN: Ferritin: 407 ng/mL — ABNORMAL HIGH (ref 10–291)

## 2010-01-02 LAB — COMPREHENSIVE METABOLIC PANEL WITH GFR
ALT: 14 U/L (ref 0–35)
AST: 18 U/L (ref 0–37)
Albumin: 4.3 g/dL (ref 3.5–5.2)
Alkaline Phosphatase: 54 U/L (ref 39–117)
BUN: 20 mg/dL (ref 6–23)
CO2: 26 meq/L (ref 19–32)
Calcium: 9.1 mg/dL (ref 8.4–10.5)
Chloride: 104 meq/L (ref 96–112)
Creatinine, Ser: 0.91 mg/dL (ref 0.40–1.20)
Glucose, Bld: 106 mg/dL — ABNORMAL HIGH (ref 70–99)
Potassium: 4 meq/L (ref 3.5–5.3)
Sodium: 140 meq/L (ref 135–145)
Total Bilirubin: 0.2 mg/dL — ABNORMAL LOW (ref 0.3–1.2)
Total Protein: 8 g/dL (ref 6.0–8.3)

## 2010-01-02 LAB — CBC WITH DIFFERENTIAL/PLATELET
BASO%: 0.6 % (ref 0.0–2.0)
Basophils Absolute: 0 10*3/uL (ref 0.0–0.1)
EOS%: 1.7 % (ref 0.0–7.0)
Eosinophils Absolute: 0.1 10*3/uL (ref 0.0–0.5)
HCT: 38.8 % (ref 34.8–46.6)
HGB: 12.7 g/dL (ref 11.6–15.9)
LYMPH%: 45.9 % (ref 14.0–49.7)
MCH: 27.1 pg (ref 25.1–34.0)
MCHC: 32.7 g/dL (ref 31.5–36.0)
MCV: 82.7 fL (ref 79.5–101.0)
MONO#: 0.4 10*3/uL (ref 0.1–0.9)
MONO%: 7.9 % (ref 0.0–14.0)
NEUT#: 2.1 10*3/uL (ref 1.5–6.5)
NEUT%: 43.9 % (ref 38.4–76.8)
Platelets: 202 10*3/uL (ref 145–400)
RBC: 4.69 10*6/uL (ref 3.70–5.45)
RDW: 13.8 % (ref 11.2–14.5)
WBC: 4.8 10*3/uL (ref 3.9–10.3)
lymph#: 2.2 10*3/uL (ref 0.9–3.3)
nRBC: 0 % (ref 0–0)

## 2010-01-02 LAB — LACTATE DEHYDROGENASE: LDH: 173 U/L (ref 94–250)

## 2010-01-02 LAB — IRON AND TIBC
%SAT: 19 % — ABNORMAL LOW (ref 20–55)
Iron: 65 ug/dL (ref 42–145)
TIBC: 348 ug/dL (ref 250–470)

## 2010-01-06 ENCOUNTER — Ambulatory Visit: Payer: Self-pay | Admitting: Internal Medicine

## 2010-01-10 ENCOUNTER — Encounter: Payer: Self-pay | Admitting: Internal Medicine

## 2010-02-03 ENCOUNTER — Telehealth: Payer: Self-pay | Admitting: Internal Medicine

## 2010-02-09 ENCOUNTER — Encounter (INDEPENDENT_AMBULATORY_CARE_PROVIDER_SITE_OTHER): Payer: Self-pay | Admitting: *Deleted

## 2010-03-23 ENCOUNTER — Telehealth: Payer: Self-pay | Admitting: Internal Medicine

## 2010-03-23 ENCOUNTER — Emergency Department (HOSPITAL_COMMUNITY): Admission: EM | Admit: 2010-03-23 | Discharge: 2010-03-23 | Payer: Self-pay | Admitting: Family Medicine

## 2010-03-29 ENCOUNTER — Encounter: Payer: Self-pay | Admitting: Internal Medicine

## 2010-03-29 ENCOUNTER — Ambulatory Visit: Payer: Self-pay | Admitting: Internal Medicine

## 2010-03-29 ENCOUNTER — Encounter (INDEPENDENT_AMBULATORY_CARE_PROVIDER_SITE_OTHER): Payer: Self-pay | Admitting: *Deleted

## 2010-03-29 DIAGNOSIS — R062 Wheezing: Secondary | ICD-10-CM | POA: Insufficient documentation

## 2010-04-03 ENCOUNTER — Emergency Department (HOSPITAL_COMMUNITY): Admission: EM | Admit: 2010-04-03 | Discharge: 2010-04-03 | Payer: Self-pay | Admitting: Emergency Medicine

## 2010-05-02 ENCOUNTER — Ambulatory Visit: Payer: Self-pay | Admitting: Oncology

## 2010-07-04 NOTE — Assessment & Plan Note (Signed)
Summary: ER FU/ NOT BETTER/ MAY NEED CHEST X-RAY/NWS   Vital Signs:  Patient profile:   45 year old female Height:      64 inches Weight:      204.13 pounds BMI:     35.17 O2 Sat:      98 % on Room air Temp:     98.5 degrees F oral Pulse rate:   117 / minute BP sitting:   128 / 80  (left arm) Cuff size:   large  Vitals Entered By: Zella Ball Ewing CMA (AAMA) (March 29, 2010 2:22 PM)  O2 Flow:  Room air CC: ER Followup, chest pain and pressure, dizzy, nuaseated/RE   CC:  ER Followup, chest pain and pressure, dizzy, and nuaseated/RE.  History of Present Illness: here to f/u recnet ER evaluation oct 20, tx for acute bronchitis with midl wheezing with prednisone, tessalon, ventolin HFA, and amoxil; unfort developed 3 days worsening wheezing, sob/doe and chest discomfort pleuritic to upper mid chest , intermittent, without radiation, n/v, palps, syncope.  Much anxiety, severe financial strains as well.  Also wtih right earache, but no sinus pain, buthas some dizziness and nausea.  Not pregnant.    Problems Prior to Update: 1)  Wheezing  (ICD-786.07) 2)  Bronchitis-acute  (ICD-466.0) 3)  Otitis Media, Right  (ICD-382.9) 4)  Hot Flashes  (ICD-627.2) 5)  Preventive Health Care  (ICD-V70.0) 6)  Sexually Transmitted Disease, Exposure To  (ICD-V01.6) 7)  Dyspnea On Exertion  (ICD-786.09) 8)  Chest Pain  (ICD-786.50) 9)  Sinusitis - Acute-nos  (ICD-461.9) 10)  Hyperthyroidism  (ICD-242.90) 11)  Gerd  (ICD-530.81) 12)  Hypothyroidism  (ICD-244.9) 13)  Hyperlipidemia  (ICD-272.4) 14)  Asthma  (ICD-493.90) 15)  Anemia-iron Deficiency  (ICD-280.9)  Medications Prior to Update: 1)  Levothyroxine Sodium 137 Mcg Tabs (Levothyroxine Sodium) .[name].. 1 By Mouth Once Daily 2)  Proair Hfa 108 (90 Base) Mcg/act Aers (Albuterol Sulfate) .... 2 Puffs Four Times Per Day As Needed Shortness of Breath 3)  Omeprazole 20 Mg Cpdr (Omeprazole) .[name].. 1po Once Daily 4)  Valacyclovir Hcl 500 Mg Tabs (Valacyclovir  Hcl) .[name].. 1 By Mouth Once Daily 5)  Tessalon Perles 100 Mg Caps (Benzonatate) .[name].. 1-2 By Mouth Three Times A Day As Needed  Current Medications (verified): 1)  Levothyroxine Sodium 137 Mcg Tabs (Levothyroxine Sodium) .[name].. 1 By Mouth Once Daily 2)  Proair Hfa 108 (90 Base) Mcg/act Aers (Albuterol Sulfate) .... 2 Puffs Four Times Per Day As Needed Shortness of Breath 3)  Omeprazole 20 Mg Cpdr (Omeprazole) .[name].. 1po Once Daily 4)  Valacyclovir Hcl 500 Mg Tabs (Valacyclovir Hcl) .[name].. 1 By Mouth Once Daily 5)  Tessalon Perles 100 Mg Caps (Benzonatate) .[name].. 1-2 By Mouth Three Times A Day As Needed 6)  Prednisone 10 Mg Tabs (Prednisone) .... 4po Qd For 3days, Then 3po Qd For 3days, Then 2po Qd For 3days, Then 1po Qd For 3 Days, Then Stop 7)  Ventolin Hfa 108 (90 Base) Mcg/act Aers (Albuterol Sulfate) .... 2 Puffs Every 4 Hours As Needed 8)  Levaquin 500 Mg Tabs (Levofloxacin) .[name].. 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  , mild ill  Head:  normocephalic and atraumatic.   Eyes:  vision grossly intact, pupils equal, and pupils round.   Ears:  bilat tm's red, sinus nontender Nose:  nasal dischargemucosal pallor and mucosal edema.   Mouth:  pharyngeal erythema and fair dentition.   Neck:  supple and cervical lymphadenopathy.   Lungs:  normal respiratory effort, R decreased  breath sounds, R wheezes, L decreased breath sounds, and L wheezes.   Heart:  normal rate and regular rhythm.   Extremities:  no edema, no erythema    Impression & Recommendations:  Problem # 1:  BRONCHITIS-ACUTE (ICD-466.0)  The following medications were removed from the medication list:    Benzonatate 100 Mg Caps (Benzonatate) .[name]... 2 by mouth every 8 hours as needed for cough Her updated medication list for this problem includes:    Proair Hfa 108 (90 Base) Mcg/act Aers (Albuterol sulfate) .[name]... 2 puffs four times per day as needed shortness of breath    Tessalon Perles 100 Mg Caps (Benzonatate) .[name]... 1-2 by mouth three times a day as needed    Ventolin Hfa 108 (90 Base) Mcg/act Aers (Albuterol sulfate) .[name]... 2 puffs every 4 hours as needed    Levaquin 500 Mg Tabs (Levofloxacin) .[name]... 1 by mouth once daily  - generic cant r/o developing pna - for f/u repeat cxr  - d/c amoxil, change to generic levaquin  Problem # 2:  WHEEZING (ICD-786.07)  mild, likely due to above, but not responding to lower dose prednisone from [facility] - for depomedrol IM today, and higher strength predpack for home  Orders: Depo- Medrol 40mg  (J1030) Depo- Medrol 80mg  (J1040) Admin of Therapeutic Inj  intramuscular or subcutaneous (65784)  Problem # 3:  CHEST PAIN (ICD-786.50) pleuritic, ecg reviewed, to check cxr  - r/o pna, doubt cardiac, cant r/o MSK as well  Orders: EKG w/ Interpretation (93000) T-2 View CXR, Same Day (71020.5TC)  Complete Medication List: 1)  Levothyroxine Sodium 137 Mcg Tabs (Levothyroxine sodium) .[name].. 1 by mouth once daily 2)  Proair Hfa 108 (90 Base) Mcg/act Aers (Albuterol sulfate) .... 2 puffs four times per day as needed shortness of breath 3)  Omeprazole 20 Mg Cpdr (Omeprazole) .[name].. 1po once daily 4)  Valacyclovir Hcl 500 Mg Tabs (Valacyclovir hcl) .[name].. 1 by mouth once daily 5)  Tessalon Perles 100 Mg Caps (Benzonatate) .[name].. 1-2 by mouth three times a day  as needed 6)  Prednisone 10 Mg Tabs (Prednisone) .... 4po qd for 3days, then 3po qd for 3days, then 2po qd for 3days, then 1po qd for 3 days, then stop 7)  Ventolin Hfa 108 (90 Base) Mcg/act Aers (Albuterol sulfate) .... 2 puffs every 4 hours as needed 8)  Levaquin 500 Mg Tabs (Levofloxacin) .[name].. 1 by mouth once daily  - generic  Patient Instructions: 1)  you had the steroid shot today 2)  stop the amoxil 3)  start the generic for levaquin, and the higher strength prednisone (both generic, sorry we did not have samples today) 4)  Continue all previous medications as before this visit , including the pill for cough and the inhaler as needed 5)  Your EKG was ok 6)  Please go to Radiology in the basement level for your X-Ray today  7)  Please  call the number on the [facility] Card for results of your testing  8)  Please schedule a follow-up appointment in July 2012 with CPX labs , or sooner if needed Prescriptions: PREDNISONE 10 MG TABS (PREDNISONE) 4po qd for 3days, then 3po qd for 3days, then 2po qd for 3days, then 1po qd for 3 days, then stop  #30 x 0   Entered and Authorized by:   Corwin Levins MD   Signed by:   Corwin Levins MD on 03/29/2010   Method used:   Print then Give to Patient   RxID:   [id number] LEVAQUIN 500 MG TABS (LEVOFLOXACIN) 1 by mouth once daily  - GENERIC  #10 x 0   Entered and Authorized by:   Corwin Levins MD   Signed by:   Corwin Levins MD on 03/29/2010   Method used:   Print then Give to Patient   RxID:   [id number]    Medication Administration  Injection # 1:    Medication: Depo- Medrol 40mg     Diagnosis: WHEEZING (ICD-786.07)    Route: IM    Site: LUOQ gluteus    Exp Date: 09/2012    Lot #: [id number]    Mfr: [employer]    Comments: Patient received 120mg  Depo-Medrol    Patient tolerated injection without complications    Given by: Zella Ball Ewing CMA Duncan Dull) (March 29, 2010 3:18 PM)  Injection # 2:    Medication: Depo- Medrol 80mg     Diagnosis: WHEEZING  (ICD-786.07)    Route: IM    Site: LUOQ gluteus    Exp Date: 09/2012    Lot #: [id number]    Mfr: [employer]    Given by: Zella Ball Ewing CMA Duncan Dull) (March 29, 2010 3:18 PM)  Orders Added: 1)  EKG w/ Interpretation [93000] 2)  Depo- Medrol 40mg  [J1030] 3)  Depo- Medrol 80mg  [J1040] 4)  Admin of Therapeutic Inj  intramuscular or subcutaneous [96372] 5)  T-2 View CXR, Same Day [71020.5TC] 6)  Est. Patient Level IV [84696]

## 2010-07-04 NOTE — Letter (Signed)
Summary: Out of Work  [facility]  [address] [place], [place] [address]   Phone: [phone]  Fax: [phone]    March 29, 2010   Employee:  Marilyn [name]    To Whom It May Concern:   For Medical reasons, please excuse the above named employee from work for the following dates:  Start:   03/23/2010  End:   03/31/2010  If you need additional information, please feel free to contact our office.         Sincerely,    Dr. Oliver Barre

## 2010-07-04 NOTE — Letter (Signed)
Summary: Generic Letter  [facility]  [address] [place], [place] [address]   Phone: [phone]  Fax: [phone]    02/09/2010  Marilyn [name] [address] [place], [place]  [address]  Dear Ms. Morace  Our office has called you back several times in response to your phone call on February 03, 2010. I have left several messages but have been unable to speak to you. Please call our office at your convenience if you have any questions.         Sincerely,   Robin Ewing CMA (AAMA)

## 2010-07-04 NOTE — Letter (Signed)
Summary: Outpatient Coinsurance Notice  Outpatient Coinsurance Notice   Imported By: Marylou Mccoy 01/11/2010 18:08:08  _____________________________________________________________________  External Attachment:    Type:   Image     Comment:   External Document

## 2010-07-04 NOTE — Assessment & Plan Note (Signed)
Summary: PHYSICAL-PER [facility]--[facility]C   Vital Signs:  Patient profile:   45 year old female Height:      64 inches Weight:      198.50 pounds BMI:     34.20 O2 Sat:      94 % on Room air Temp:     99.6 degrees F oral Pulse rate:   104 / minute BP sitting:   124 / 70  (left arm) Cuff size:   regular  Vitals Entered By: Zella Ball Ewing CMA (AAMA) (December 22, 2009 1:15 PM)  O2 Flow:  Room air  CC: Adult Physical/RE   CC:  Adult Physical/RE.  History of Present Illness: here for yearly exam,  c/o DOE for 1-2 yrs gradually worse, not clear if assoc with wt gain but has gained wt from approx 170's to current 198;  can only walk about 161ft until onset fatigue and DOE/sob;  only have CP when purposefulloy trying to excercise on treadmill;  has been trying to walk slowly for an hour a day around 6 am approx 3 miles but has to stop and rest occasaionlly; No wheezing, but may have had some drainage from sinus, and only slight nonprod cough.  Also some right earache with ? mild decreased hearing and has to ask people to repeat themselves when standing on the right.  Pt denies  orthopnea, pnd, worsening LE edema, palps,  or syncope, but has had some mild dizziness with heat to the back of the neck and head she thought might be releated to perimenopause.  Mother and grandfather died with asthma.  Pt without nighttime awakenings and not sure if wheezing.  Not tried any inhalers or primatene otc.  Did have an inhaler rx just last april iwth a "cold" but has not tried recently.  Had PNA in april 2011, and missed her 2 mo appt with Dr Murinson/hematology for iron def anemia - was re-sched to aug 1.  Also with right ear pain with popping and crackling in the past wk, but No fever, wt loss, night sweats, loss of appetite or other constitutional symptoms .  Does have occas reflux symtpoms without dysphagia or vomiting , abd pain or blood.  Preventive Screening-Counseling & Management  Alcohol-Tobacco     Smoking  Status: never      Drug Use:  no.    Problems Prior to Update: 1)  Chest Pain  (ICD-786.50) 2)  Sinusitis - Acute-nos  (ICD-461.9) 3)  Hyperthyroidism  (ICD-242.90) 4)  Gerd  (ICD-530.81) 5)  Hypothyroidism  (ICD-244.9) 6)  Hyperlipidemia  (ICD-272.4) 7)  Asthma  (ICD-493.90) 8)  Anemia-iron Deficiency  (ICD-280.9)  Medications Prior to Update: 1)  Synthroid 137 Mcg Tabs (Levothyroxine Sodium) .... Take 1 Tablet By Mouth Once A Day  Current Medications (verified): 1)  Levothyroxine Sodium 137 Mcg Tabs (Levothyroxine Sodium) .[name].. 1 By Mouth Once Daily 2)  Proair Hfa 108 (90 Base) Mcg/act Aers (Albuterol Sulfate) .... 2 Puffs Four Times Per Day As Needed Shortness of Breath 3)  Cephalexin 500 Mg Caps (Cephalexin) .[name].. 1 By Mouth Three Times A Day 4)  Omeprazole 20 Mg Cpdr (Omeprazole) .[name].. 1po Once Daily  Allergies (verified): No Known Drug Allergies  Past History:  Past Medical History: Last updated: 01/31/2007 Anemia-iron deficiency Asthma Hyperlipidemia Hypothyroidism GERD Hyperthyroidism  Past Surgical History: Last updated: 01/31/2007 Hysterectomy Tubal ligation broken jaw breast reduction EGD 2004 Carpal tunnel release Thyroidectomy  Risk Factors: Smoking Status: never (12/22/2009)  Family History: Reviewed history and no changes  required. HTN  Social History: Reviewed history and no changes required. 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          all otherwise negative per pt -  pt requests STD check , has occasional hot flashes - ? menopause  Physical Exam  General:  alert and overweight-appearing.   Head:  normocephalic and atraumatic.   Eyes:  vision grossly intact, pupils equal, and pupils round.   Ears:  right tm marked erythema, canals clear, left tm ok Nose:  no external deformity and no nasal discharge.   Mouth:  pharyngeal erythema and fair dentition.   Neck:  supple and no masses.   Lungs:  normal respiratory effort and normal breath sounds.   Heart:  normal rate and regular rhythm.   Abdomen:  soft and normal bowel sounds.  with mild epigastric tender Msk:  no joint tenderness and no joint swelling.   Extremities:  no edema, no erythema  Neurologic:  cranial nerves II-XII intact and strength normal in all extremities.   Skin:  color normal and no rashes.   Psych:  moderately anxious.     Impression & Recommendations:  Problem # 1:  Preventive Health Care (ICD-V70.0)  Overall doing well, age appropriate education and counseling updated and referral for appropriate preventive services done unless declined, immunizations up to date or declined, diet counseling done if overweight, urged to quit smoking if smokes , most recent labs reviewed and current ordered if appropriate, ecg reviewed or declined (interpretation per ECG scanned in the EMR if done); information regarding Medicare Prevention requirements given if appropriate; speciality referrals updated as appropriate   Orders: EKG w/ Interpretation (93000)  Problem # 2:  DYSPNEA ON EXERTION (ICD-786.09)  for cxr today (and to f/u for cxr april 2011 with RUL pna);  also for echo., and PFT's , and trial proair hfa  Orders: T-2 View CXR, Same Day (71020.5TC) Echo Referral (Echo) Misc. Referral (Misc. Ref)  Her updated medication list for this problem includes:    Proair Hfa 108 (90 Base) Mcg/act Aers (Albuterol sulfate) .[name]... 2 puffs four times per day as needed  shortness of breath  Problem # 3:  SEXUALLY TRANSMITTED DISEASE, EXPOSURE TO (ICD-V01.6) per pt request  - to check labs Orders: T-HIV-1 (Screen) [phone]) T-RPR (Syphilis) [phone]) T-Herpes Simplex Type 2 ([id number]) T-Chlamydia & GC Probe, Urine ([id number]/[id number])  Problem # 4:  HOT FLASHES (ICD-627.2) menopause ->?  for lab testing Orders: TLB-FSH (Follicle Stimulating Hormone) (83001-FSH)  Problem # 5:  OTITIS MEDIA, RIGHT (ICD-382.9)  Her updated medication list for this problem includes:    Cephalexin 500 Mg Caps (Cephalexin) .[name]... 1 by mouth three times a day treat as above, f/u any worsening signs or symptoms ., and mucinex otc as needed   Problem # 6:  GERD (ICD-530.81)  Her updated medication list for this problem includes:    Omeprazole 20 Mg Cpdr (Omeprazole) .[name]... 1po once daily treat as above, f/u any worsening signs or symptoms , also given 1 mo nexiun 40 mg today  Complete Medication List: 1)  Levothyroxine Sodium 137 Mcg Tabs (Levothyroxine sodium) .Marland KitchenMarland KitchenMarland Kitchen  1 by mouth once daily 2)  Proair Hfa 108 (90 Base) Mcg/act Aers (Albuterol sulfate) .... 2 puffs four times per day as needed shortness of breath 3)  Cephalexin 500 Mg Caps (Cephalexin) .[name].. 1 by mouth three times a day 4)  Omeprazole 20 Mg Cpdr (Omeprazole) .[name].. 1po once daily  Other Orders: Tdap => 47yrs IM (95621) Admin 1st Vaccine (30865)  Patient Instructions: 1)  you had the tetanus shot today 2)  Please take all new medications as prescribed  - the inhaler, and the antibiotic, and the generic omeprazole 20 mg per day for reflux 3)  You can also use Mucinex OTC or it's generic for congestion  4)  Continue all previous medications as before this visit 5)  Please go to Radiology in the basement level for your X-Ray today  6)  Please go to the Lab in the basement for your blood  tests today  7)  You will be contacted about the referral(s) to: lung testing, and the echocardiogram (Heart test) 8)   your lab results were sent to Dr Dagoberto Ligas, and Dr Arline Asp 9)  please see your GYN for pap smear yearly 10)  please call for your yearly mammogram  - to consider [facility] on [address] 11)  Please schedule a follow-up appointment in 1 year or sooner if needed Prescriptions: OMEPRAZOLE 20 MG CPDR (OMEPRAZOLE) 1po once daily  #90 x 3   Entered and Authorized by:   Corwin Levins MD   Signed by:   Corwin Levins MD on 12/22/2009   Method used:   Print then Give to Patient   RxID:   [id number] CEPHALEXIN 500 MG CAPS (CEPHALEXIN) 1 by mouth three times a day  #30 x 0   Entered and Authorized by:   Corwin Levins MD   Signed by:   Corwin Levins MD on 12/22/2009   Method used:   Print then Give to Patient   RxID:   [id number] PROAIR HFA 108 (90 BASE) MCG/ACT AERS (ALBUTEROL SULFATE) 2 puffs four times per day as needed shortness of breath  #1 x 11   Entered and Authorized by:   Corwin Levins MD   Signed by:   Corwin Levins MD on 12/22/2009   Method used:   Print then Give to Patient   RxID:   [id number]    Immunizations Administered:  Tetanus Vaccine:    Vaccine Type: Tdap    Site: right deltoid    Mfr: GlaxoSmithKline    Dose: 0.5 ml    Route: IM    Given by: Zella Ball Ewing CMA (AAMA)    Exp. Date: 08/27/2011    Lot #: [id number]    VIS given: 04/22/07 version given December 22, 2009.

## 2010-07-04 NOTE — Progress Notes (Signed)
----   Converted from flag ---- ---- 12/22/2009 3:29 PM, Edman Circle wrote: appt 7/26 @ 7:30   ---- 12/22/2009 2:02 PM, Dagoberto Reef wrote: Thanks  ---- 12/22/2009 1:37 PM, Corwin Levins MD wrote: The following orders have been entered for this patient and placed on Admin Hold:  Type:     Referral       Code:   Echo Description:   Echo Referral Order Date:   12/22/2009   Authorized By:   Corwin Levins MD Order #:   [id number] Clinical Notes:   Type:  Special instructions: ------------------------------

## 2010-07-04 NOTE — Miscellaneous (Signed)
Summary: Orders Update pft charges  Clinical Lists Changes  Orders: Added new Service order of Carbon Monoxide diffusing w/capacity (94720) - Signed Added new Service order of Lung Volumes (94240) - Signed Added new Service order of Spirometry (Pre & Post) (94060) - Signed 

## 2010-07-04 NOTE — Letter (Signed)
[facility]   Imported By: Lester Bettles 06/16/2009 09:08:35  _____________________________________________________________________  External Attachment:    Type:   Image     Comment:   External Document

## 2010-07-04 NOTE — Letter (Signed)
[facility]   Imported By: Sherian Rein 01/18/2010 08:18:45  _____________________________________________________________________  External Attachment:    Type:   Image     Comment:   External Document

## 2010-07-04 NOTE — Letter (Signed)
[facility]   Imported By: Sherian Rein 10/11/2009 14:23:13  _____________________________________________________________________  External Attachment:    Type:   Image     Comment:   External Document

## 2010-07-04 NOTE — Letter (Signed)
[facility]   Imported By: Lester Newport 07/20/2009 08:14:47  _____________________________________________________________________  External Attachment:    Type:   Image     Comment:   External Document

## 2010-07-04 NOTE — Progress Notes (Signed)
Summary: NEEDS OV   Phone Note Call from Patient   Summary of Call: Pt called req samples of synthroid. Advised pt that last office visit [facility] 2008 w/Dr Jonny Ruiz and she was overdue for office visit. Transferred to schedulers for office visit.  Initial call taken by: Lamar Sprinkles, CMA,  November 15, 2009 10:19 AM

## 2010-07-04 NOTE — Progress Notes (Signed)
Summary: cough med request  Phone Note Call from Patient   Caller: Patient Summary of Call: pt is requesting presciption for cough medicine. contact# [phone] & [phone]. Please advise. Initial call taken by: Alysia Penna,  March 23, 2010 4:17 PM  Follow-up for Phone Call        ok for tess perle - done per emr; consider OV to figure out source of cough expecially if fever, pain, productive cough, chills, ST Follow-up by: Corwin Levins MD,  March 23, 2010 4:20 PM  Additional Follow-up for Phone Call Additional follow up Details #1::        Pt informed  Additional Follow-up by: Lamar Sprinkles, CMA,  March 23, 2010 5:05 PM    New/Updated Medications: TESSALON PERLES 100 MG CAPS (BENZONATATE) 1-2 by mouth three times a day as needed Prescriptions: TESSALON PERLES 100 MG CAPS (BENZONATATE) 1-2 by mouth three times a day as needed  #60 x 0   Entered and Authorized by:   Corwin Levins MD   Signed by:   Corwin Levins MD on 03/23/2010   Method used:   Electronically to        [facility]  [address] [phone]* (retail)       [address]       [place], [place]  [address]       Ph: [phone]       Fax: [phone]   RxID:   [id number]

## 2010-07-04 NOTE — Progress Notes (Signed)
Summary: rx question  Phone Note Call from Patient Call back at Home Phone [phone]   Summary of Call: Patient is requesting a call back.  Initial call taken by: Lamar Sprinkles, CMA,  February 03, 2010 4:16 PM  Follow-up for Phone Call        pt called regarding question about rx left on phone tree at last visit she could not remember name of rx she would like a callback from Dr. Jonny Ruiz if possible when he returns Follow-up by: Brenton Grills MA,  February 03, 2010 4:24 PM  Additional Follow-up for Phone Call Additional follow up Details #1::        robin to let pt know that we can rx generic valtrex daily for suppresive therapy (or 7 days course with refills for any individual outbreaks) of genital herpes if she so desires  (or she can elect no therapy at all at this time)  Additional Follow-up by: Corwin Levins MD,  February 03, 2010 9:40 PM    Additional Follow-up for Phone Call Additional follow up Details #2::    called pt left msg. to call back called pt left msg. to call back. Robin Ewing CMA Duncan Dull)  February 08, 2010 8:24 AM  called pt left msg. to call back.Robin Ewing CMA Duncan Dull)  February 08, 2010 3:54 PM    Follow-up by: Zella Ball Ewing CMA Duncan Dull),  February 07, 2010 9:35 AM  Additional Follow-up for Phone Call Additional follow up Details #3:: Details for Additional Follow-up Action Taken: called pt left msg. to call back   Called patient and left msg. to call back. Sent patient a letter to inform to call our office at her convenience with any questions as patient has not returned phone calls Robin Ewing CMA Duncan Dull)  February 09, 2010 9:37 AM  Additional Follow-up by: Zella Ball Ewing CMA Duncan Dull),  February 09, 2010 7:55 AM   Appended Document: rx question Patient returned phone call informed of above information.  She would like the suppressive therapy and send to [facility]. She has missed her period for 4 months and wanted to know if this would be  why?  Appended Document: rx question ok for suprressive tx,  should not be related to menses  Appended Document: rx question called pt left msg. to call back  Appended Document: rx question called pt informed of above information. Patient does want prescription sent to [facility] [address].

## 2010-07-04 NOTE — Progress Notes (Signed)
Summary: CALL A NURSE REPORT  Phone Note Other Incoming   Summary of Call: [facility] Triage Call Report Sudie Grumbling Operator: [id number] Triage Record Num: Call Date & Time: Marilyn [name] Patient Name: 08/20/2009 8:40:13AM Oliver Barre PCP: [phone] Patient Phone: PCP Fax : [phone] Patient Gender: Female 1966-05-09 Patient DOB: Practice Name: [facility] MRN: [id number] Reason for Call: 3/17 onset sore throat, and coughing up phlegm w small streaks of blood notes to sputum. Afebrile, states neck glands are swollen and painful. Care advice given, will proceed to UC for eval of throat, Motrin 200 mg 2 tabs po q 6 hrs prn pain. Sore Throat / Hoarseness Protocol(s) Used: See Provider within 24 hours Recommended Outcome per Protocol: Reason for Outcome: New onset of painful, swollen glands on sides of neck or under jaw Care Advice: Swollen lymph glands that persist more than two weeks must be evaluated by provider.  ~ Call provider if any of the following signs and symptoms develop: severe sore throat, enlarged tonsils with white or yellow patches, tender swollen glands, fever, generally feel ill, persistent low grade headache, or abdominal pain.  ~ To help prevent the spread of infection, do not share eating or drinking utensils, personal care items like a toothbrush, or food. Wash hands often with soap and water or alcohol-based hand rub.  ~ Stay home until your temperature returns to normal.  ~ Apply warm, moist soaks or compresses to the affected area for 20-30 minutes 3 to 4 times per day. Avoid burning skin by using water no hotter than bath water and by not lying on the compresses.  ~ Drink 6-10 eight ounce glasses (1.2-2.0 liters) of fluids per day unless previously instructed to restrict fluid intake for other medical reasons. Limit fluids that contain caffeine, sugar or alcohol.  ~ HEALTH PROMOTION / MAINTENANCE  ~ Sore Throat Relief: - Use warm salt water  gargles 3 to 4 times/day, as needed (1/2 tsp. salt in 8 oz. [.2 liters] water). - Suck on hard candy, OTC or herbal throat lozenges (sugar-free   Follow-up for Phone Call        - Eat soothing, soft food/fluids (broths, soups, or honey and lemon juice in hot tea, Popsicles, frozen yogurt or sherbet, scrambled eggs, cooked cereals, Jell-O or puddings) whichever is most comforting. - Avoid eating salty, spicy or acidic foods.  ~ Analgesic Advice: Consider aspirin, ibuprofen, naproxen or ketoprofen for pain or fever as directed on label or by pharmacist/provider. PRECAUTION: - If over 48 years of age, should not take longer than 1 week without consulting provider. EXCEPTIONS: - Should not be used if taking blood thinners. - Or if have history of sensitivity/allergy to any of these medications; or history of ulcer or kidney disease.  ~ SYMPTOM / CONDITION MANAGEMENT  ~ INFECTION CONTROL  ~ Rest until symptoms improve. May return to full activity when all symptoms are completely relieved.  ~ Page 1 of 1 08/20/2009 9:08:42AM [id number] Follow-up by: Lamar Sprinkles, CMA,  August 22, 2009 11:40 AM

## 2010-07-07 NOTE — Letter (Signed)
[facility]   Imported By: Lester [place] 08/18/2009 09:11:26  _____________________________________________________________________  External Attachment:    Type:   Image     Comment:   External Document

## 2010-07-11 ENCOUNTER — Other Ambulatory Visit (HOSPITAL_COMMUNITY): Payer: Self-pay | Admitting: Oncology

## 2010-07-11 ENCOUNTER — Encounter (HOSPITAL_BASED_OUTPATIENT_CLINIC_OR_DEPARTMENT_OTHER): Payer: BC Managed Care – PPO | Admitting: Oncology

## 2010-07-11 ENCOUNTER — Encounter: Payer: Self-pay | Admitting: Internal Medicine

## 2010-07-11 DIAGNOSIS — D509 Iron deficiency anemia, unspecified: Secondary | ICD-10-CM

## 2010-07-11 LAB — CBC WITH DIFFERENTIAL/PLATELET
BASO%: 0.9 % (ref 0.0–2.0)
Basophils Absolute: 0 10*3/uL (ref 0.0–0.1)
EOS%: 1.3 % (ref 0.0–7.0)
Eosinophils Absolute: 0.1 10*3/uL (ref 0.0–0.5)
HCT: 36.1 % (ref 34.8–46.6)
HGB: 12.1 g/dL (ref 11.6–15.9)
LYMPH%: 44 % (ref 14.0–49.7)
MCH: 27.1 pg (ref 25.1–34.0)
MCHC: 33.6 g/dL (ref 31.5–36.0)
MCV: 80.6 fL (ref 79.5–101.0)
MONO#: 0.4 10*3/uL (ref 0.1–0.9)
MONO%: 8.5 % (ref 0.0–14.0)
NEUT#: 2.1 10*3/uL (ref 1.5–6.5)
NEUT%: 45.3 % (ref 38.4–76.8)
Platelets: 224 10*3/uL (ref 145–400)
RBC: 4.48 10*6/uL (ref 3.70–5.45)
RDW: 14.1 % (ref 11.2–14.5)
WBC: 4.7 10*3/uL (ref 3.9–10.3)
lymph#: 2.1 10*3/uL (ref 0.9–3.3)

## 2010-07-11 LAB — FERRITIN: Ferritin: 196 ng/mL (ref 10–291)

## 2010-07-11 LAB — COMPREHENSIVE METABOLIC PANEL
ALT: 17 U/L (ref 0–35)
Albumin: 4.2 g/dL (ref 3.5–5.2)
Alkaline Phosphatase: 57 U/L (ref 39–117)
Potassium: 3.8 mEq/L (ref 3.5–5.3)
Sodium: 138 mEq/L (ref 135–145)
Total Bilirubin: 0.4 mg/dL (ref 0.3–1.2)
Total Protein: 7.6 g/dL (ref 6.0–8.3)

## 2010-07-11 LAB — IRON AND TIBC: %SAT: 20 % (ref 20–55)

## 2010-07-27 ENCOUNTER — Other Ambulatory Visit: Payer: Self-pay | Admitting: Obstetrics and Gynecology

## 2010-08-10 NOTE — Letter (Signed)
Summary: [facility]  [facility]   Imported By: Sherian Rein 08/02/2010 07:32:55  _____________________________________________________________________  External Attachment:    Type:   Image     Comment:   External Document

## 2010-08-16 LAB — BASIC METABOLIC PANEL
CO2: 26 mEq/L (ref 19–32)
GFR calc Af Amer: >60 mL/min (ref >60)
GFR calc non Af Amer: >60 mL/min (ref >60)
Glucose, Bld: 119 mg/dL — ABNORMAL HIGH (ref 70–99)
Potassium: 4.6 mEq/L (ref 3.5–5.1)
Sodium: 137 mEq/L (ref 135–145)

## 2010-08-16 LAB — POCT I-STAT, CHEM 8
Creatinine, Ser: 0.9 mg/dL (ref 0.4–1.2)
Glucose, Bld: 115 mg/dL — ABNORMAL HIGH (ref 70–99)
HCT: 40 % (ref 36.0–46.0)
Hemoglobin: 13.6 g/dL (ref 12.0–15.0)
Potassium: 4.3 mEq/L (ref 3.5–5.1)
Sodium: 136 mEq/L (ref 135–145)
TCO2: 24 mmol/L (ref 0–100)

## 2010-08-16 LAB — CBC
HCT: 37.6 % (ref 36.0–46.0)
Hemoglobin: 12.4 g/dL (ref 12.0–15.0)
MCH: 27.4 pg (ref 26.0–34.0)
MCHC: 33 g/dL (ref 30.0–36.0)
RBC: 4.52 MIL/uL (ref 3.87–5.11)

## 2010-08-16 LAB — DIFFERENTIAL
Basophils Relative: 0 % (ref 0–1)
Eosinophils Absolute: 0 10*3/uL (ref 0.0–0.7)
Eosinophils Relative: 0 % (ref 0–5)
Monocytes Relative: 4 % (ref 3–12)
Neutrophils Relative %: 84 % — ABNORMAL HIGH (ref 43–77)

## 2010-08-16 LAB — URINALYSIS, ROUTINE W REFLEX MICROSCOPIC
Bilirubin Urine: NEGATIVE
Protein, ur: 30 mg/dL — AB
Urobilinogen, UA: 0.2 mg/dL (ref 0.0–1.0)

## 2010-08-16 LAB — POCT CARDIAC MARKERS: CKMB, poc: <1 ng/mL — ABNORMAL LOW (ref 1.0–8.0)

## 2010-08-17 ENCOUNTER — Other Ambulatory Visit: Payer: Self-pay | Admitting: Obstetrics and Gynecology

## 2010-08-22 LAB — DIFFERENTIAL
Basophils Absolute: 0.1 10*3/uL (ref 0.0–0.1)
Basophils Relative: 2 % — ABNORMAL HIGH (ref 0–1)
Eosinophils Absolute: 0.1 10*3/uL (ref 0.0–0.7)
Eosinophils Relative: 2 % (ref 0–5)
Monocytes Absolute: 0.4 10*3/uL (ref 0.1–1.0)
Monocytes Relative: 8 % (ref 3–12)

## 2010-08-22 LAB — BASIC METABOLIC PANEL
CO2: 26 mEq/L (ref 19–32)
Calcium: 8.7 mg/dL (ref 8.4–10.5)
Chloride: 103 mEq/L (ref 96–112)
GFR calc Af Amer: >60 mL/min (ref >60)
Glucose, Bld: 101 mg/dL — ABNORMAL HIGH (ref 70–99)
Sodium: 136 mEq/L (ref 135–145)

## 2010-08-22 LAB — CBC
HCT: 34.1 % — ABNORMAL LOW (ref 36.0–46.0)
Hemoglobin: 11.9 g/dL — ABNORMAL LOW (ref 12.0–15.0)
MCHC: 35 g/dL (ref 30.0–36.0)
MCV: 86.2 fL (ref 78.0–100.0)
RBC: 3.95 MIL/uL (ref 3.87–5.11)
RDW: 12.5 % (ref 11.5–15.5)

## 2010-08-30 ENCOUNTER — Telehealth: Payer: Self-pay | Admitting: Internal Medicine

## 2010-08-30 NOTE — Telephone Encounter (Signed)
Pt advised that per office policy, OV is needed for Rx ABX. Pt transferred to make OV

## 2010-08-30 NOTE — Telephone Encounter (Signed)
Pt states that she has a sore throat/drainage and would like a callback from our office ar an ABX called in to pharmacy. Called back to verify pharmacy; had to leave msg for Pt.

## 2010-08-31 ENCOUNTER — Ambulatory Visit: Payer: BC Managed Care – PPO | Admitting: Endocrinology

## 2010-08-31 DIAGNOSIS — Z0289 Encounter for other administrative examinations: Secondary | ICD-10-CM

## 2010-09-02 ENCOUNTER — Ambulatory Visit (INDEPENDENT_AMBULATORY_CARE_PROVIDER_SITE_OTHER): Payer: BC Managed Care – PPO | Admitting: Family Medicine

## 2010-09-02 ENCOUNTER — Encounter: Payer: Self-pay | Admitting: Family Medicine

## 2010-09-02 VITALS — BP 138/94 | HR 68 | Temp 98.3°F | Wt 200.0 lb

## 2010-09-02 DIAGNOSIS — J029 Acute pharyngitis, unspecified: Secondary | ICD-10-CM

## 2010-09-02 DIAGNOSIS — J019 Acute sinusitis, unspecified: Secondary | ICD-10-CM

## 2010-09-02 LAB — POCT RAPID STREP A (OFFICE): Rapid Strep A Screen: NEGATIVE

## 2010-09-02 MED ORDER — AMOXICILLIN 500 MG PO TABS: 1000.0000 mg | ORAL_TABLET | Freq: Two times a day (BID) | ORAL | Status: AC

## 2010-09-02 NOTE — Progress Notes (Signed)
  Subjective:    Patient ID: Marilyn [name], female    DOB: 10-07-1965, 45 y.o.   MRN: [id number]  Sore Throat  This is a new problem. The current episode started in the past 7 days. The problem has been gradually worsening. Neither side of throat is experiencing more pain than the other. There has been no fever. Associated symptoms include congestion, coughing, ear discharge, ear pain and headaches. Pertinent negatives include no neck pain, shortness of breath or trouble swallowing. Associated symptoms comments: Facial pain on right over maxillary sinus. She has had no exposure to strep. Treatments tried: Taking claritin D. The treatment provided moderate relief.   Had leftover amoxicillin and she took 6 tabs of 500 mg.... Helped some.   Review of Systems  Constitutional: Negative for appetite change.  HENT: Positive for ear pain, congestion, sinus pressure and ear discharge. Negative for trouble swallowing and neck pain.        Sinus pressure is on right  Eyes: Negative for pain.  Respiratory: Positive for cough. Negative for shortness of breath.   Cardiovascular: Negative for chest pain.  Neurological: Positive for headaches.       Objective:   Physical Exam  Constitutional: Vital signs are normal. She appears well-developed and well-nourished. She is cooperative.  Non-toxic appearance. She does not appear ill. No distress.       Morbidly obese femalel in NAD  HENT:  Head: Normocephalic.  Right Ear: Hearing, tympanic membrane, external ear and ear canal normal.  Left Ear: Hearing, tympanic membrane, external ear and ear canal normal.  Nose: Mucosal edema and rhinorrhea present. Right sinus exhibits maxillary sinus tenderness. Right sinus exhibits no frontal sinus tenderness. Left sinus exhibits no maxillary sinus tenderness and no frontal sinus tenderness.  Mouth/Throat: Uvula is midline, oropharynx is clear and moist and mucous membranes are normal.  Eyes: Conjunctivae, EOM and  lids are normal. Pupils are equal, round, and reactive to light. No foreign bodies found.  Neck: Trachea normal and normal range of motion. Neck supple. Carotid bruit is not present. No mass and no thyromegaly present.  Cardiovascular: Normal rate, regular rhythm, S1 normal, S2 normal, normal heart sounds, intact distal pulses and normal pulses.  Exam reveals no gallop and no friction rub.   No murmur heard. Pulmonary/Chest: Effort normal and breath sounds normal. Not tachypneic. No respiratory distress. She has no decreased breath sounds. She has no wheezes. She has no rhonchi. She has no rales.  Genitourinary: Uterus is not enlarged and not tender. Cervix exhibits motion tenderness. Cervix exhibits no discharge and no friability. Right adnexum displays no mass, no tenderness and no fullness. Left adnexum displays no mass, no tenderness and no fullness.  Lymphadenopathy:    She has no cervical adenopathy.  Neurological: She has normal strength.  Skin: Skin is intact. No rash noted.  Psychiatric: Her speech is normal and behavior is normal. Judgment normal. Cognition and memory are normal.          Assessment & Plan:

## 2010-09-02 NOTE — Patient Instructions (Signed)
Treat with course of antibiotics. Nasal saline irrigation 3-4 times a day. Mucinex to break up mucus. Follow up if not improving in 4-5 days. 

## 2010-09-02 NOTE — Assessment & Plan Note (Signed)
Treat with course of antibiotics. Nasal saline irrigation 3-4 times a day. Mucinex to break up mucus. Follow up if not improving in 4-5 days.

## 2010-09-09 LAB — CBC
HCT: 33.8 % — ABNORMAL LOW (ref 36.0–46.0)
MCHC: 33.5 g/dL (ref 30.0–36.0)
MCV: 78.8 fL (ref 78.0–100.0)
Platelets: 216 10*3/uL (ref 150–400)
WBC: 4 10*3/uL (ref 4.0–10.5)

## 2010-09-09 LAB — BASIC METABOLIC PANEL
BUN: 13 mg/dL (ref 6–23)
CO2: 25 mEq/L (ref 19–32)
Chloride: 108 mEq/L (ref 96–112)
Creatinine, Ser: 0.73 mg/dL (ref 0.4–1.2)
Glucose, Bld: 92 mg/dL (ref 70–99)
Potassium: 3.9 mEq/L (ref 3.5–5.1)

## 2010-09-09 LAB — D-DIMER, QUANTITATIVE: D-Dimer, Quant: 0.47 ug/mL-FEU (ref 0.00–0.48)

## 2010-09-09 LAB — DIFFERENTIAL
Basophils Relative: 1 % (ref 0–1)
Eosinophils Absolute: 0 10*3/uL (ref 0.0–0.7)
Eosinophils Relative: 1 % (ref 0–5)
Lymphs Abs: 1.5 10*3/uL (ref 0.7–4.0)

## 2010-10-20 NOTE — Op Note (Signed)
[facility]  Patient:    Marilyn [name], Marilyn [name]             MRN: [id number] Proc. Date: 12/31/00 Adm. Date:  [id number] Attending:  Wandalee Ferdinand                           Operative Report  PREOPERATIVE DIAGNOSES:       1. Fibroid uterus.                               2. Abnormal uterine bleeding.  POSTOPERATIVE DIAGNOSES:      1. Fibroid uterus.                               2. Abnormal uterine bleeding, pathology                                  pending.  PROCEDURE:                    1. Diagnostic/operative hysteroscopy.                               2. Dilatation and curettage.  SURGEON:                      Rudy Jew. Ashley Royalty, M.D.  ANESTHESIA:                   General.  ESTIMATED BLOOD LOSS:         Less than 50 cc.  COMPLICATIONS:                None.  PACKS AND DRAINS:             None.  DESCRIPTION OF PROCEDURE:     The patient was taken to the operating room and placed in the dorsal supine position.  After adequate general anesthesia was administered, she was placed in the lithotomy position and prepped and draped in the usual manner for vaginal surgery.  A posterior weighted retractor was placed per vagina.  The anterior lip of the cervix was grasped with a single-tooth tenaculum.  The uterus was gently sounded to approximately 8.0 cm using the sound.  It was noted to be slightly anteverted.  The cervix was then dilated to a size 29-French using Shawnie Pons dilators.  The resectoscope was placed into the uterine cavity using Sorbitol as a distention medium.  Visualization was somewhat difficult.  The tubal ostia were felt to be identified, although the operator could not be 100% certain, due to the fact that there were some tissue overlying them bilaterally; however, there was an appearance of the tubal ostia beneath this tissue bilaterally.  Appropriate photos were obtained.  There was a small polyp, versus fibroid, located  on the posterior cervix which was excised with a resectoscope using the coagulation weight, performing approximately 70 watts power.  Hemostasis was easily obtained.  No additional abnormalities were noted.  Attention was then turned to the curettage.  A medium-sized curet was introduced into the uterine cavity.  A four-quadrant diagnostic curettage was performed.  Then a therapeutic curettage was performed.  The curettings were submitted separately to pathology for  histologic studies.  At this point the patient was felt to have benefited maximally from this surgical procedure.  The vaginal instruments were removed.  The procedure was terminated.  The patient was taken to the recovery room in excellent condition. DD:  12/31/00 TD:  12/31/00 Job: [id number]

## 2010-10-20 NOTE — Assessment & Plan Note (Signed)
[facility]                                   ON-CALL NOTE   JAISA, DEFINO                      MRN:          [id number]  DATE:03/24/2006                            DOB:          May 12, 1966    HOME PHONE NUMBER:  [phone]   PRIMARY CARE DOCTOR:  Dr. Jonny Ruiz.   SUBJECTIVE:  Sore throat and cough starting today.  Feels worse on 1 side  versus the other.  Some difficulty swallowing, and feels somewhat difficult  to breathe, but is getting air okay.   ASSESSMENT AND PLAN:  Severe sore throat:  She does not have any increased  work of breathing.  She does not sound like she has any true emergent  shortness of breath, but she was encouraged if she does have true shortness  of breath to go immediately to the emergency room versus calling an  ambulance to go to the emergency room.  If it is just the sensation of  severe sore throat, she can take ibuprofen, Tylenol, guaifenesin, gargle  with salt for her symptoms.  She also took Benadryl earlier, and this would  be good in case it is an allergic reaction, but it does not sound like this.  She will see her primary care doctor in approximately 5 hours at the clinic.  Again, she was told that if it is true shortness of breath or true  difficulty swallowing saliva, to go to the emergency room.       Kerby Nora, MD      AB/MedQ  DD:  03/25/2006  DT:  03/25/2006  Job #:  [id number]   cc:   Corwin Levins, MD

## 2010-10-20 NOTE — Assessment & Plan Note (Signed)
[facility]                                 ON-CALL NOTE   Marilyn [name], Marilyn [name]                      MRN:          [id number]  DATE:05/27/2006                            DOB:          [birth year]    PATIENT'S PHYSICIAN:  Corwin Levins, M.D.   PHONE NUMBER:  [phone]   Phone call came at 5:11 a.m. on May 27, 2006.  She calls asking me  what she can take for chest congestion and thick mucus which is  affecting her breathing to some degree and she has tried Thera-Flu and  wants to know what else she can try.   PLAN:  I told her that she should probably go to the emergency room if  she is having trouble with her breathing and tightness in her chest.  Originally when I got the beep at 5 a.m. I thought it was a cardiac  thing.  She clearly was saying she has chest congestion from an  infection though, and wanted me to phone something in for her.  I told  her she needs to be seen after the office was open this morning, and  that she should seek emergency care if she is having significant chest  congestion or chest pain or shortness of breath.     Karie Schwalbe, MD  Electronically Signed    RIL/MedQ  DD: 05/27/2006  DT: 05/27/2006  Job #: [id number]   cc:   Corwin Levins, MD

## 2010-10-20 NOTE — H&P (Signed)
[facility]  Patient:    Marilyn [name], Marilyn [name]                      MRN: [id number] Attending:  Rudy Jew. Ashley Royalty, M.D.                         History and Physical  HISTORY OF PRESENT ILLNESS:   This is a 45 year old gravida 5, para 3, AB 3 referred through the courtesy of Dr. Lupe Carney for a known fibroid uterus and abnormal uterine bleeding.  She had an ultrasound on April 04, 2000 by Dr. Clovis Riley which revealed uterine fibroids, the largest of which was 4.3 cm in greatest diameter.  The patient also has chronic anemia.  Her periods are quite regular and extremely heavy.  She denies other GI, GU, or GYN symptoms. She is here for diagnostic/operative hysteroscopy and dilation and curettage.  MEDICATIONS:                  Synthroid 0.250 mg daily.  PAST MEDICAL HISTORY:         1. Asthma.                               2. Hypothyroidism.  PAST SURGICAL HISTORY:        1. "Thyroid removed."                               2. Breast reduction.                               3. ______.                               4. Repair of fractured jaw.  ALLERGIES:                    CODEINE--nausea and vomiting.  FAMILY HISTORY:               Positive for hypertension, diabetes, and breast cancer.  SOCIAL HISTORY:               The patient denies use of tobacco or significant alcohol.  REVIEW OF SYSTEMS:            Noncontributory.  PHYSICAL EXAMINATION:  GENERAL:                      A well-developed, well-nourished, pleasant black female in no acute distress.  VITAL SIGNS:                  Afebrile.  Vital signs stable.  SKIN:                         Warm and dry without lesions.  LYMPH NODES:                  There is no supraclavicular, cervical, or inguinal adenopathy.  NECK:                         Supple without thyromegaly.  CHEST:  Lungs are clear.  CARDIAC:                      Regular rate and rhythm without  murmurs, gallops, or rubs.  BREASTS:                      Deferred.  ABDOMEN:                      Soft and nontender without masses or organomegaly.  Bowel sounds are active.  MUSCULOSKELETAL:              Full range of motion without edema, cyanosis, or CVA tenderness.  PELVIC:                       Deferred until examination under anesthesia.  ACCESSORY CLINICAL FINDINGS:  Ultrasound performed December 19, 2000 revealed fibroid uterus and no evidence of any adnexal cysts.  CBC obtained Oct 22, 2000 revealed a hemoglobin of 7.5.  IMPRESSION:                   1. Fibroid uterus.                               2. Abnormal uterine bleeding--rule out                                  submucosal component versus polyp.                               3. Asthma.                               4. Hypothyroidism.                               5. History of "thyroid removed."                               6. ______.  PLAN:                         1. Diagnostic/operative hysteroscopy.                               2. Dilation and curettage.  Risks, benefits, complications, and alternatives were fully discussed with the patient.  She states she understands and accepts.  Questions were invited and then answered. DD:  12/31/00 TD:  12/31/00 Job: [id number]

## 2010-11-07 ENCOUNTER — Other Ambulatory Visit (HOSPITAL_COMMUNITY): Payer: Self-pay | Admitting: Oncology

## 2010-11-07 ENCOUNTER — Encounter (HOSPITAL_BASED_OUTPATIENT_CLINIC_OR_DEPARTMENT_OTHER): Payer: BC Managed Care – PPO | Admitting: Oncology

## 2010-11-07 DIAGNOSIS — D509 Iron deficiency anemia, unspecified: Secondary | ICD-10-CM

## 2010-11-07 LAB — CBC WITH DIFFERENTIAL/PLATELET
Basophils Absolute: 0 10*3/uL (ref 0.0–0.1)
EOS%: 1.3 % (ref 0.0–7.0)
HCT: 33.3 % — ABNORMAL LOW (ref 34.8–46.6)
HGB: 11.2 g/dL — ABNORMAL LOW (ref 11.6–15.9)
MCH: 26.5 pg (ref 25.1–34.0)
MCHC: 33.5 g/dL (ref 31.5–36.0)
MCV: 78.9 fL — ABNORMAL LOW (ref 79.5–101.0)
MONO%: 8.8 % (ref 0.0–14.0)
NEUT%: 43.3 % (ref 38.4–76.8)

## 2010-11-07 LAB — COMPREHENSIVE METABOLIC PANEL
AST: 24 U/L (ref 0–37)
Alkaline Phosphatase: 65 U/L (ref 39–117)
BUN: 11 mg/dL (ref 6–23)
Creatinine, Ser: 0.83 mg/dL (ref 0.50–1.10)
Total Bilirubin: 0.2 mg/dL — ABNORMAL LOW (ref 0.3–1.2)

## 2010-11-07 LAB — IRON AND TIBC
%SAT: 11 % — ABNORMAL LOW (ref 20–55)
Iron: 43 ug/dL (ref 42–145)
TIBC: 378 ug/dL (ref 250–470)
UIBC: 335 ug/dL

## 2010-11-08 ENCOUNTER — Encounter: Payer: Self-pay | Admitting: Gastroenterology

## 2010-11-10 ENCOUNTER — Encounter (HOSPITAL_BASED_OUTPATIENT_CLINIC_OR_DEPARTMENT_OTHER): Payer: BC Managed Care – PPO | Admitting: Oncology

## 2010-11-10 DIAGNOSIS — D509 Iron deficiency anemia, unspecified: Secondary | ICD-10-CM

## 2010-11-17 ENCOUNTER — Encounter: Payer: BC Managed Care – PPO | Admitting: Oncology

## 2010-12-15 ENCOUNTER — Other Ambulatory Visit (HOSPITAL_COMMUNITY): Payer: Self-pay | Admitting: Oncology

## 2010-12-15 ENCOUNTER — Encounter (HOSPITAL_BASED_OUTPATIENT_CLINIC_OR_DEPARTMENT_OTHER): Payer: BC Managed Care – PPO | Admitting: Oncology

## 2010-12-15 DIAGNOSIS — D509 Iron deficiency anemia, unspecified: Secondary | ICD-10-CM

## 2010-12-15 LAB — CBC WITH DIFFERENTIAL/PLATELET
BASO%: 0.4 % (ref 0.0–2.0)
LYMPH%: 43.6 % (ref 14.0–49.7)
MCHC: 33.5 g/dL (ref 31.5–36.0)
MONO#: 0.4 10*3/uL (ref 0.1–0.9)
Platelets: 229 10*3/uL (ref 145–400)
RBC: 4.07 10*6/uL (ref 3.70–5.45)
WBC: 4.5 10*3/uL (ref 3.9–10.3)
lymph#: 2 10*3/uL (ref 0.9–3.3)

## 2010-12-15 LAB — FERRITIN: Ferritin: 801 ng/mL — ABNORMAL HIGH (ref 10–291)

## 2010-12-18 ENCOUNTER — Encounter: Payer: Self-pay | Admitting: Gastroenterology

## 2010-12-18 ENCOUNTER — Ambulatory Visit (INDEPENDENT_AMBULATORY_CARE_PROVIDER_SITE_OTHER): Payer: BC Managed Care – PPO | Admitting: Gastroenterology

## 2010-12-18 VITALS — BP 142/100 | HR 108 | Ht 64.0 in | Wt 206.4 lb

## 2010-12-18 DIAGNOSIS — K5900 Constipation, unspecified: Secondary | ICD-10-CM

## 2010-12-18 DIAGNOSIS — K219 Gastro-esophageal reflux disease without esophagitis: Secondary | ICD-10-CM

## 2010-12-18 DIAGNOSIS — D509 Iron deficiency anemia, unspecified: Secondary | ICD-10-CM

## 2010-12-18 MED ORDER — OMEPRAZOLE 20 MG PO CPDR: 20.0000 mg | DELAYED_RELEASE_CAPSULE | Freq: Every day | ORAL | Status: DC

## 2010-12-18 MED ORDER — PEG-KCL-NACL-NASULF-NA ASC-C 100 G PO SOLR: 1.0000 | Freq: Once | ORAL | Status: DC

## 2010-12-18 NOTE — Patient Instructions (Signed)
You have been scheduled for a Upper Endoscopy/ Colonoscopy at [facility]. Separate instructions given. Pick up your prep and prescription from your pharmacy.  Patient advised to avoid spicy, acidic, citrus, chocolate, mints, fruit and fruit juices.  Limit the intake of caffeine, alcohol and Soda.  Don't exercise too soon after eating.  Don't lie down within 3-4 hours of eating.  Elevate the head of your bed. Low gas diet given. cc: Kimberlee Nearing, MD

## 2010-12-18 NOTE — Progress Notes (Signed)
History of Present Illness: This is a 45 year old female with iron deficiency anemia. She has problems with frequent reflux symptoms almost after every meal for the past several months. She has intermittent constipation, hard stools, gas and bloating as well. She underwent upper endoscopy and colonoscopy in November 2004 for iron deficiency anemia, reflux symptoms, constipation and hematochezia. Internal hemorrhoids and mild gastritis was noted. She has been managed by Dr. Arline Asp with IV fe and her fe losses were felt to be secondary to heavy menstrual periods but her menstrual periods have improved over the past few months. Stool Hemoccults were negative in March. Recent hemoglobin was 11.2, MCV of 78.9, iron saturation of 11%. She denies weight loss, changes stool caliber, diarrhea, dysphagia, nausea, vomiting. She has repeat Hemoccults with her today to submit to Dr. Mamie Levers office.  Past Medical History  Diagnosis Date  . Hyperlipidemia   . HYPOTHYROIDISM 01/30/2007  . 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/2007  . ASTHMA 01/30/2007  . GERD 01/31/2007  . Hiatal hernia   . 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 Hemorrhoids    Past Surgical History  Procedure Date  . Abdominal hysterectomy   . Tubal ligation 1987  . Broken jaw   . Breast reduction surgery 1986  . Carpal tunnel release 2001  . Thyroidectomy     reports that she quit smoking about 31 years ago. She does not have any smokeless tobacco history on file. She reports that she does not drink alcohol or use illicit drugs. family history includes Breast cancer in her sister; Diabetes in her maternal grandmother; Heart disease in her maternal grandmother; and Hypertension in her father.  There is no history of Colon cancer. Allergies  Allergen Reactions  . Codeine    Outpatient Encounter Prescriptions as of 12/18/2010  Medication Sig Dispense Refill  . albuterol (VENTOLIN HFA) 108 (90 BASE) MCG/ACT inhaler Inhale 2 puffs into the lungs every 4  (four) hours as needed.        . AMBULATORY NON FORMULARY MEDICATION Iron infusion as needed.       [name] levothyroxine (SYNTHROID, LEVOTHROID) 137 MCG tablet Take 137 mcg by mouth daily.        [name] omeprazole (PRILOSEC) 20 MG capsule Take 1 capsule (20 mg total) by mouth daily.  30 capsule  11  . peg 3350 powder (MOVIPREP) 100 G SOLR Take 1 kit (100 g total) by mouth once.  1 kit  0  . DISCONTD: albuterol (PROAIR HFA) 108 (90 BASE) MCG/ACT inhaler Inhale 2 puffs into the lungs. Four times per day as needed for shortness of breath       . DISCONTD: omeprazole (PRILOSEC) 20 MG capsule Take 20 mg by mouth daily.        [name] DISCONTD: valACYclovir (VALTREX) 500 MG tablet 1 tablet by mouth once daily        Review of Systems: Pertinent positive and negative review of systems were noted in the above HPI section. All other review of systems were otherwise negative.  Physical Exam: General: Well developed , well nourished, no acute distress Head: Normocephalic and atraumatic Eyes:  sclerae anicteric, EOMI Ears: Normal auditory acuity Mouth: No deformity or lesions Neck: Supple, no masses or thyromegaly Lungs: Clear throughout to auscultation Heart: Regular rate and rhythm; no murmurs, rubs or bruits Abdomen: Soft, non tender and non distended. No masses, hepatosplenomegaly or hernias noted. Normal Bowel sounds Rectal: Deferred to colonoscopy Musculoskeletal: Symmetrical with no gross deformities  Skin: No lesions on visible extremities  Pulses:  Normal pulses noted Extremities: No clubbing, cyanosis, edema or deformities noted Neurological: Alert oriented x 4, grossly nonfocal Cervical Nodes:  No significant cervical adenopathy Inguinal Nodes: No significant inguinal adenopathy Psychological:  Alert and cooperative. Normal mood and affect  Assessment and Recommendations:  1. Iron deficiency anemia, constipation, gas, bloating and active reflux symptoms. Begin standard antireflux measures and  omeprazole 20 mg daily. Begin a high fiber diet and low gas diet. Scheduled upper endoscopy and colonoscopy for further evaluation. The risks, benefits, and alternatives to colonoscopy with possible biopsy and possible polypectomy were discussed with the patient and they consent to proceed. The risks, benefits, and alternatives to endoscopy with possible biopsy and possible dilation were discussed with the patient and they consent to proceed.

## 2010-12-19 ENCOUNTER — Encounter: Payer: Self-pay | Admitting: Gastroenterology

## 2010-12-19 ENCOUNTER — Other Ambulatory Visit (HOSPITAL_COMMUNITY): Payer: Self-pay | Admitting: Oncology

## 2010-12-19 ENCOUNTER — Telehealth: Payer: Self-pay | Admitting: Gastroenterology

## 2010-12-19 DIAGNOSIS — D509 Iron deficiency anemia, unspecified: Secondary | ICD-10-CM

## 2010-12-19 LAB — FECAL OCCULT BLOOD, GUAIAC: Occult Blood: NEGATIVE

## 2010-12-19 NOTE — Telephone Encounter (Signed)
Spoke with pt and she states, "I am out of work for the summer and absolutely cannot afford the prep.  I will have to borrow the money for it."  Rebate coupon offered and explained that Moviprep is the doctor's preferred prep.  Pt still states that she cannot afford prep.  Colyte prep sent to pharmacy and instructions for procedure mailed.  Pt told to call office back when instructions received and nurse will review with her.  Understanding voiced  Spoke with Marchelle Folks, who gave me a free Moviprep sample.  Pt notified and will come to third floor to pick up

## 2010-12-25 ENCOUNTER — Encounter: Payer: BC Managed Care – PPO | Admitting: Gastroenterology

## 2010-12-25 ENCOUNTER — Other Ambulatory Visit: Payer: Self-pay | Admitting: Gastroenterology

## 2010-12-25 ENCOUNTER — Ambulatory Visit (HOSPITAL_COMMUNITY)
Admission: RE | Admit: 2010-12-25 | Discharge: 2010-12-25 | Disposition: A | Discharge status: Home / Self Care | Payer: BC Managed Care – PPO | Source: Ambulatory Visit | Attending: Gastroenterology | Admitting: Gastroenterology

## 2010-12-25 DIAGNOSIS — K219 Gastro-esophageal reflux disease without esophagitis: Secondary | ICD-10-CM | POA: Insufficient documentation

## 2010-12-25 DIAGNOSIS — D509 Iron deficiency anemia, unspecified: Secondary | ICD-10-CM | POA: Insufficient documentation

## 2010-12-25 DIAGNOSIS — K449 Diaphragmatic hernia without obstruction or gangrene: Secondary | ICD-10-CM

## 2010-12-25 DIAGNOSIS — K2980 Duodenitis without bleeding: Secondary | ICD-10-CM | POA: Insufficient documentation

## 2010-12-27 ENCOUNTER — Encounter: Payer: Self-pay | Admitting: Gastroenterology

## 2011-01-03 ENCOUNTER — Telehealth: Payer: Self-pay | Admitting: Gastroenterology

## 2011-01-03 NOTE — Telephone Encounter (Signed)
All questions answered about pathology report.  She will call back for any further questions of concerns

## 2011-02-19 ENCOUNTER — Telehealth: Payer: Self-pay | Admitting: *Deleted

## 2011-02-19 MED ORDER — LEVOTHYROXINE SODIUM 137 MCG PO TABS: 137.0000 ug | ORAL_TABLET | Freq: Every day | ORAL | Status: DC

## 2011-02-19 NOTE — Telephone Encounter (Signed)
Patient requesting a call back from Dr Raphael Gibney nurse.

## 2011-02-19 NOTE — Telephone Encounter (Signed)
Called the patient and she is requesting a refill on Synthroid sent to [facility] [address] [place].

## 2011-02-23 LAB — COMPREHENSIVE METABOLIC PANEL
Albumin: 3.3 — ABNORMAL LOW
Alkaline Phosphatase: 47
BUN: 8
Creatinine, Ser: 0.9
Glucose, Bld: 85
Total Protein: 7.2

## 2011-02-23 LAB — URINALYSIS, ROUTINE W REFLEX MICROSCOPIC
Glucose, UA: NEGATIVE
Ketones, ur: NEGATIVE
Nitrite: NEGATIVE
Protein, ur: NEGATIVE
Urobilinogen, UA: 0.2

## 2011-02-23 LAB — CBC
HCT: 29.5 — ABNORMAL LOW
Hemoglobin: 9.6 — ABNORMAL LOW
MCHC: 32.4
MCV: 71.6 — ABNORMAL LOW
Platelets: 268
RDW: 16.3 — ABNORMAL HIGH

## 2011-02-23 LAB — DIFFERENTIAL
Eosinophils Absolute: 0.1
Eosinophils Relative: 1
Lymphs Abs: 2
Monocytes Absolute: 0.4
Monocytes Relative: 8

## 2011-02-23 LAB — URINE MICROSCOPIC-ADD ON

## 2011-02-23 LAB — POCT CARDIAC MARKERS: Troponin i, poc: <0.05

## 2011-03-09 ENCOUNTER — Other Ambulatory Visit (HOSPITAL_COMMUNITY): Payer: Self-pay | Admitting: Oncology

## 2011-03-09 ENCOUNTER — Encounter (HOSPITAL_BASED_OUTPATIENT_CLINIC_OR_DEPARTMENT_OTHER): Payer: BC Managed Care – PPO | Admitting: Oncology

## 2011-03-09 DIAGNOSIS — D509 Iron deficiency anemia, unspecified: Secondary | ICD-10-CM

## 2011-03-09 LAB — LACTATE DEHYDROGENASE: LDH: 225 U/L (ref 94–250)

## 2011-03-09 LAB — IRON AND TIBC: TIBC: 339 ug/dL (ref 250–470)

## 2011-03-09 LAB — COMPREHENSIVE METABOLIC PANEL
ALT: 16 U/L (ref 0–35)
Albumin: 4.3 g/dL (ref 3.5–5.2)
BUN: 14 mg/dL (ref 6–23)
CO2: 24 mEq/L (ref 19–32)
Calcium: 8.8 mg/dL (ref 8.4–10.5)
Chloride: 103 mEq/L (ref 96–112)
Creatinine, Ser: 0.86 mg/dL (ref 0.50–1.10)
Potassium: 3.9 mEq/L (ref 3.5–5.3)

## 2011-03-09 LAB — CBC WITH DIFFERENTIAL/PLATELET
BASO%: 0.4 % (ref 0.0–2.0)
Basophils Absolute: 0 10*3/uL (ref 0.0–0.1)
HCT: 36.3 % (ref 34.8–46.6)
HGB: 12.3 g/dL (ref 11.6–15.9)
MONO#: 0.3 10*3/uL (ref 0.1–0.9)
NEUT%: 47.2 % (ref 38.4–76.8)
RDW: 13.5 % (ref 11.2–14.5)
WBC: 4 10*3/uL (ref 3.9–10.3)
lymph#: 1.8 10*3/uL (ref 0.9–3.3)

## 2011-03-23 ENCOUNTER — Ambulatory Visit (INDEPENDENT_AMBULATORY_CARE_PROVIDER_SITE_OTHER): Payer: BC Managed Care – PPO | Admitting: Endocrinology

## 2011-03-23 ENCOUNTER — Encounter: Payer: Self-pay | Admitting: Endocrinology

## 2011-03-23 ENCOUNTER — Other Ambulatory Visit (INDEPENDENT_AMBULATORY_CARE_PROVIDER_SITE_OTHER): Payer: BC Managed Care – PPO

## 2011-03-23 VITALS — BP 124/84 | HR 105 | Temp 98.1°F | Ht 64.0 in | Wt 204.4 lb

## 2011-03-23 DIAGNOSIS — E039 Hypothyroidism, unspecified: Secondary | ICD-10-CM

## 2011-03-23 LAB — TSH: TSH: 0.31 u[IU]/mL — ABNORMAL LOW (ref 0.35–5.50)

## 2011-03-23 MED ORDER — LEVOTHYROXINE SODIUM 125 MCG PO TABS: 125.0000 ug | ORAL_TABLET | Freq: Every day | ORAL | Status: DC

## 2011-03-23 MED ORDER — DOXYCYCLINE HYCLATE 100 MG PO TABS: 100.0000 mg | ORAL_TABLET | Freq: Two times a day (BID) | ORAL | Status: AC

## 2011-03-23 MED ORDER — BENZONATATE 100 MG PO CAPS: 100.0000 mg | ORAL_CAPSULE | Freq: Three times a day (TID) | ORAL | Status: AC | PRN

## 2011-03-23 NOTE — Progress Notes (Signed)
  Subjective:    Patient ID: Marilyn [name], female    DOB: 1966-06-03, 45 y.o.   MRN: [id number]  HPI Pt states few days of slight pain at the throat, and assoc myalgias, worst at the lower back. Past Medical History  Diagnosis Date  . Hyperlipidemia   . HYPOTHYROIDISM 01/30/2007  . 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/2007  . ASTHMA 01/30/2007  . GERD 01/31/2007  . Hiatal hernia   . 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 Hemorrhoids     Past Surgical History  Procedure Date  . Abdominal hysterectomy   . Tubal ligation 1987  . Broken jaw   . Breast reduction surgery 1986  . Carpal tunnel release 2001  . Thyroidectomy     History   Social History  . Marital Status: Single    Spouse Name: N/A    Number of Children: 3  . Years of Education: N/A   Occupational History  . [employer]    Social History Main Topics  . Smoking status: Former Smoker    Quit date: 06/05/1979  . Smokeless tobacco: Not on file  . Alcohol Use: No  . Drug Use: No  . Sexually Active: Not on file   Other Topics Concern  . Not on file   Social History Narrative   Married    Current Outpatient Prescriptions on File Prior to Visit  Medication Sig Dispense Refill  . albuterol (VENTOLIN HFA) 108 (90 BASE) MCG/ACT inhaler Inhale 2 puffs into the lungs every 4 (four) hours as needed.        . AMBULATORY NON FORMULARY MEDICATION Iron infusion as needed.       [name] omeprazole (PRILOSEC) 20 MG capsule Take 1 capsule (20 mg total) by mouth daily.  30 capsule  11  . peg 3350 powder (MOVIPREP) 100 G SOLR Take 1 kit (100 g total) by mouth once.  1 kit  0    Allergies  Allergen Reactions  . Codeine     Family History  Problem Relation Age of Onset  . Hypertension Father   . Breast cancer Sister   . Colon cancer Neg Hx   . Diabetes Maternal Grandmother   . Heart disease Maternal Grandmother     BP 124/84  Pulse 105  Temp(Src) 98.1 F (36.7 C) (Oral)  Ht 5\' 4"  (1.626 m)  Wt 204 lb 6 oz (92.704 kg)  BMI  35.08 kg/m2  SpO2 97%  LMP 12/03/2010  Review of Systems Denies fever and earache    Objective:   Physical Exam VITAL SIGNS:  See vs page GENERAL: no distress head: no deformity eyes: no periorbital swelling, no proptosis external nose and ears are normal mouth: no lesion seen Both eac's and tm's are normal NECK: There is no palpable thyroid enlargement.  No thyroid nodule is palpable.  No palpable lymphadenopathy at the anterior neck. LUNGS:  Clear to auscultation  Lab Results  Component Value Date   TSH 0.31* 03/23/2011      Assessment & Plan:  Hypothyroidism, slightly overreplaced Glenford Peers, new

## 2011-03-23 NOTE — Patient Instructions (Addendum)
i have sent 2 prescriptions to your pharmacy (antibiotic and cough) Loratadine-d (non-prescription) will help your congestion. I hope you feel better soon.  If you don't feel better by next week, please call dr Jonny Ruiz.   blood tests are being requested for you today.  please call [phone] to hear your test results.  You will be prompted to enter the 9-digit "MRN" number that appears at the top left of this page, followed by #.  Then you will hear the message. (update: i left message on phone-tree:  Reduce synthroid to 125/d).

## 2011-04-30 ENCOUNTER — Ambulatory Visit (INDEPENDENT_AMBULATORY_CARE_PROVIDER_SITE_OTHER)
Admission: RE | Admit: 2011-04-30 | Discharge: 2011-04-30 | Disposition: A | Discharge status: Home / Self Care | Payer: BC Managed Care – PPO | Source: Ambulatory Visit | Attending: Internal Medicine | Admitting: Internal Medicine

## 2011-04-30 ENCOUNTER — Other Ambulatory Visit (INDEPENDENT_AMBULATORY_CARE_PROVIDER_SITE_OTHER): Payer: BC Managed Care – PPO

## 2011-04-30 ENCOUNTER — Other Ambulatory Visit: Payer: Self-pay | Admitting: Internal Medicine

## 2011-04-30 ENCOUNTER — Encounter: Payer: Self-pay | Admitting: Internal Medicine

## 2011-04-30 ENCOUNTER — Ambulatory Visit (INDEPENDENT_AMBULATORY_CARE_PROVIDER_SITE_OTHER): Payer: BC Managed Care – PPO | Admitting: Internal Medicine

## 2011-04-30 VITALS — BP 112/88 | HR 125 | Temp 97.8°F | Ht 64.0 in | Wt 202.5 lb

## 2011-04-30 DIAGNOSIS — Z23 Encounter for immunization: Secondary | ICD-10-CM

## 2011-04-30 DIAGNOSIS — R079 Chest pain, unspecified: Secondary | ICD-10-CM

## 2011-04-30 DIAGNOSIS — D509 Iron deficiency anemia, unspecified: Secondary | ICD-10-CM

## 2011-04-30 DIAGNOSIS — Z0000 Encounter for general adult medical examination without abnormal findings: Secondary | ICD-10-CM

## 2011-04-30 DIAGNOSIS — J209 Acute bronchitis, unspecified: Secondary | ICD-10-CM

## 2011-04-30 DIAGNOSIS — R062 Wheezing: Secondary | ICD-10-CM

## 2011-04-30 DIAGNOSIS — K219 Gastro-esophageal reflux disease without esophagitis: Secondary | ICD-10-CM

## 2011-04-30 LAB — CBC WITH DIFFERENTIAL/PLATELET
Basophils Relative: 0.5 % (ref 0.0–3.0)
Eosinophils Relative: 1.3 % (ref 0.0–5.0)
Lymphocytes Relative: 50.1 % — ABNORMAL HIGH (ref 12.0–46.0)
Monocytes Absolute: 0.4 10*3/uL (ref 0.1–1.0)
Monocytes Relative: 8 % (ref 3.0–12.0)
Neutrophils Relative %: 40.1 % — ABNORMAL LOW (ref 43.0–77.0)
Platelets: 244 10*3/uL (ref 150.0–400.0)
RBC: 4.69 Mil/uL (ref 3.87–5.11)
WBC: 5.2 10*3/uL (ref 4.5–10.5)

## 2011-04-30 LAB — LIPID PANEL
Cholesterol: 219 mg/dL — ABNORMAL HIGH (ref 0–200)
HDL: 43.1 mg/dL (ref >39.00)
Total CHOL/HDL Ratio: 5
Triglycerides: 206 mg/dL — ABNORMAL HIGH (ref 0.0–149.0)
VLDL: 41.2 mg/dL — ABNORMAL HIGH (ref 0.0–40.0)

## 2011-04-30 LAB — BASIC METABOLIC PANEL
BUN: 19 mg/dL (ref 6–23)
Calcium: 9.2 mg/dL (ref 8.4–10.5)
Chloride: 104 mEq/L (ref 96–112)
Creatinine, Ser: 0.8 mg/dL (ref 0.4–1.2)
GFR: 100.15 mL/min (ref >60.00)

## 2011-04-30 LAB — LDL CHOLESTEROL, DIRECT: Direct LDL: 146.7 mg/dL

## 2011-04-30 MED ORDER — OMEPRAZOLE 20 MG PO CPDR: 20.0000 mg | DELAYED_RELEASE_CAPSULE | Freq: Every day | ORAL | Status: DC

## 2011-04-30 MED ORDER — AZITHROMYCIN 250 MG PO TABS: ORAL_TABLET | ORAL | Status: AC

## 2011-04-30 MED ORDER — HYDROCODONE-HOMATROPINE 5-1.5 MG/5ML PO SYRP: 5.0000 mL | ORAL_SOLUTION | Freq: Four times a day (QID) | ORAL | Status: AC | PRN

## 2011-04-30 MED ORDER — PREDNISONE 10 MG PO TABS: 10.0000 mg | ORAL_TABLET | Freq: Every day | ORAL | Status: AC

## 2011-04-30 MED ORDER — METHYLPREDNISOLONE ACETATE PF 80 MG/ML IJ SUSP
120.0000 mg | Freq: Once | INTRAMUSCULAR | Status: AC
  Administered 2011-04-30: 120 mg via INTRAMUSCULAR

## 2011-04-30 NOTE — Assessment & Plan Note (Signed)
Right post lat - suspect msk, pt requests cxr , ok for this,  to f/u any worsening symptoms or concerns

## 2011-04-30 NOTE — Progress Notes (Signed)
Subjective:    Patient ID: Marilyn [name], female    DOB: 1966/03/21, 45 y.o.   MRN: [id number]  HPI  Here for wellness and f/u;  Overall doing ok;  Pt denies CP, worsening SOB, DOE, wheezing, orthopnea, PND, worsening LE edema, palpitations, dizziness or syncope.  Pt denies neurological change such as new Headache, facial or extremity weakness.  Pt denies polydipsia, polyuria, or low sugar symptoms. Pt states overall good compliance with treatment and medications, good tolerability, and trying to follow lower cholesterol diet.  Pt denies worsening depressive symptoms, suicidal ideation or panic. No fever, wt loss, night sweats, loss of appetite, or other constitutional symptoms.  Pt states good ability with ADL's, low fall risk, home safety reviewed and adequate, no significant changes in hearing or vision, and occasionally active with exercise.  Incidentally today with acute onset mild to mod 2-3 days ST, HA, general weakness and malaise, with prod cough greenish sputum, except for onset midl wheezing/sob today, and harder to sleep, and catch breath with coughing. Also with a sharp and dull recurring pain to the left post lat chest/side, mild for 2 days, worse with cough, not better with anything.  Could not go to work today, thinks the children there or her ill grandson at home may have contributed to her illness Past Medical History  Diagnosis Date  . Hyperlipidemia   . HYPOTHYROIDISM 01/30/2007  . 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/2007  . ASTHMA 01/30/2007  . GERD 01/31/2007  . Hiatal hernia   . 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 Hemorrhoids    Past Surgical History  Procedure Date  . Abdominal hysterectomy   . Tubal ligation 1987  . Broken jaw   . Breast reduction surgery 1986  . Carpal tunnel release 2001  . Thyroidectomy     reports that she quit smoking about 31 years ago. She does not have any smokeless tobacco history on file. She reports that she does not drink alcohol or use illicit  drugs. family history includes Breast cancer in her sister; Diabetes in her maternal grandmother; Heart disease in her maternal grandmother; and Hypertension in her father.  There is no history of Colon cancer. Allergies  Allergen Reactions  . Codeine    Current Outpatient Prescriptions on File Prior to Visit  Medication Sig Dispense Refill  . albuterol (VENTOLIN HFA) 108 (90 BASE) MCG/ACT inhaler Inhale 2 puffs into the lungs every 4 (four) hours as needed.        [name] levothyroxine (SYNTHROID, LEVOTHROID) 125 MCG tablet Take 1 tablet (125 mcg total) by mouth daily.  30 tablet  11  . peg 3350 powder (MOVIPREP) 100 G SOLR Take 1 kit (100 g total) by mouth once.  1 kit  0  . AMBULATORY NON FORMULARY MEDICATION Iron infusion as needed.        No current facility-administered medications on file prior to visit.   Review of Systems Review of Systems  Constitutional: Negative for diaphoresis, activity change, appetite change and unexpected weight change.  HENT: Negative for hearing loss, ear pain, facial swelling, mouth sores and neck stiffness.   Eyes: Negative for pain, redness and visual disturbance.  Respiratory: Negative for shortness of breath and wheezing.   Cardiovascular: Negative for chest pain and palpitations.  Gastrointestinal: Negative for diarrhea, blood in stool, abdominal distention and rectal pain.  Genitourinary: Negative for hematuria, flank pain and decreased urine volume.  Musculoskeletal: Negative for myalgias and joint swelling.  Skin: Negative for color change and wound.  Neurological: Negative for syncope and numbness.  Hematological: Negative for adenopathy.  Psychiatric/Behavioral: Negative for hallucinations, self-injury, decreased concentration and agitation.      Objective:   Physical Exam BP 112/88  Pulse 125  Temp(Src) 97.8 F (36.6 C) (Oral)  Ht 5\' 4"  (1.626 m)  Wt 202 lb 8 oz (91.853 kg)  BMI 34.76 kg/m2  SpO2 98%  LMP 12/03/2010 Physical Exam  VS  noted, mild ill Constitutional: Pt is oriented to person, place, and time. Appears well-developed and well-nourished.  HENT:  Head: Normocephalic and atraumatic.  Right Ear: External ear normal.  Left Ear: External ear normal.  Nose: Nose normal.  Mouth/Throat: Oropharynx is clear and moist.  Bilat tm's mild erythema.  Sinus nontender.  Pharynx mild erythema Eyes: Conjunctivae and EOM are normal. Pupils are equal, round, and reactive to light.  Neck: Normal range of motion. Neck supple. No JVD present. No tracheal deviation present.  Cardiovascular: Normal rate, regular rhythm, normal heart sounds and intact distal pulses.   Pulmonary/Chest: Effort normal and breath sounds decreased with mild bilat wheeze  Abdominal: Soft. Bowel sounds are normal. There is no tenderness.  Musculoskeletal: Normal range of motion. Exhibits no edema.  Lymphadenopathy:  Has no cervical adenopathy.  Neurological: Pt is alert and oriented to person, place, and time. Pt has normal reflexes. No cranial nerve deficit.  Skin: Skin is warm and dry. No rash noted.  Psychiatric:  Has  normal mood and affect. Behavior is normal. 1-2+ nervous    Assessment & Plan:

## 2011-04-30 NOTE — Assessment & Plan Note (Signed)
Mild to mod, for antibx course,  to f/u any worsening symptoms or concerns, for cough med as well 

## 2011-04-30 NOTE — Assessment & Plan Note (Signed)
Overall doing well, age appropriate education and counseling updated, referrals for preventative services and immunizations addressed, dietary and smoking counseling addressed, most recent labs and ECG reviewed.  I have personally reviewed and have noted: 1) the patient's medical and social history 2) The pt's use of alcohol, tobacco, and illicit drugs 3) The patient's current medications and supplements 4) Functional ability including ADL's, fall risk, home safety risk, hearing and visual impairment 5) Diet and physical activities 6) Evidence for depression or mood disorder 7) The patient's height, weight, and BMI have been recorded in the chart I have made referrals, and provided counseling and education based on review of the above For flu shot today, recent labs since oct 2012 reviewed with pt and copy given, still needs Lipids- will order

## 2011-04-30 NOTE — Progress Notes (Signed)
Addended by: Scharlene Gloss B on: 04/30/2011 10:35 AM   Modules accepted: Orders

## 2011-04-30 NOTE — Assessment & Plan Note (Signed)
stable overall by hx and exam, most recent data reviewed with pt, and pt to continue medical treatment as before, but has run out of PPI for 1 wk, requests refill for recurrent symptoms -

## 2011-04-30 NOTE — Patient Instructions (Signed)
You had the steroid shot today, and the flu shot Take all new medications as prescribed - the antibiotic, cough medicine, and prednisone Continue all other medications as before, including the refill of prilosec as you requested Please go to LAB in the Basement for the blood and/or urine tests to be done today Please go to XRAY in the Basement for the x-ray test Please call the phone number [phone] (the PhoneTree System) for results of testing in 2-3 days;  When calling, simply dial the number, and when prompted enter the MRN number above (the Medical Record Number) and the # key, then the message should start. You are given the work note today Please return in 1 year for your yearly visit, or sooner if needed, with Lab testing done 3-5 days before

## 2011-04-30 NOTE — Assessment & Plan Note (Signed)
stable overall by hx and exam, most recent data reviewed with pt, and pt to continue medical treatment as before  Lab Results  Component Value Date   TSH 0.31* 03/23/2011

## 2011-04-30 NOTE — Assessment & Plan Note (Signed)
Mild to mod, for depomedrol IM,and predpack asd,  to f/u any worsening symptoms or concerns 

## 2011-06-16 ENCOUNTER — Telehealth: Payer: Self-pay | Admitting: Oncology

## 2011-06-16 NOTE — Telephone Encounter (Signed)
S/w the pt and she is aware of her feb 2013 appts °

## 2011-07-16 ENCOUNTER — Ambulatory Visit: Payer: BC Managed Care – PPO | Admitting: Physician Assistant

## 2011-07-24 ENCOUNTER — Ambulatory Visit: Payer: BC Managed Care – PPO | Admitting: Oncology

## 2011-07-25 ENCOUNTER — Emergency Department (HOSPITAL_COMMUNITY): Payer: BC Managed Care – PPO

## 2011-07-25 ENCOUNTER — Emergency Department (HOSPITAL_COMMUNITY)
Admission: EM | Admit: 2011-07-25 | Discharge: 2011-07-25 | Disposition: A | Discharge status: Home / Self Care | Payer: BC Managed Care – PPO | Attending: Emergency Medicine | Admitting: Emergency Medicine

## 2011-07-25 ENCOUNTER — Encounter (HOSPITAL_COMMUNITY): Payer: Self-pay | Admitting: *Deleted

## 2011-07-25 DIAGNOSIS — E785 Hyperlipidemia, unspecified: Secondary | ICD-10-CM | POA: Insufficient documentation

## 2011-07-25 DIAGNOSIS — K219 Gastro-esophageal reflux disease without esophagitis: Secondary | ICD-10-CM | POA: Insufficient documentation

## 2011-07-25 DIAGNOSIS — K649 Unspecified hemorrhoids: Secondary | ICD-10-CM | POA: Insufficient documentation

## 2011-07-25 DIAGNOSIS — K449 Diaphragmatic hernia without obstruction or gangrene: Secondary | ICD-10-CM | POA: Insufficient documentation

## 2011-07-25 DIAGNOSIS — E039 Hypothyroidism, unspecified: Secondary | ICD-10-CM | POA: Insufficient documentation

## 2011-07-25 DIAGNOSIS — R0600 Dyspnea, unspecified: Secondary | ICD-10-CM

## 2011-07-25 DIAGNOSIS — J45901 Unspecified asthma with (acute) exacerbation: Secondary | ICD-10-CM

## 2011-07-25 LAB — DIFFERENTIAL
Basophils Absolute: 0 10*3/uL (ref 0.0–0.1)
Basophils Relative: 0 % (ref 0–1)
Eosinophils Absolute: 0.1 10*3/uL (ref 0.0–0.7)
Neutro Abs: 2.3 10*3/uL (ref 1.7–7.7)
Neutrophils Relative %: 43 % (ref 43–77)

## 2011-07-25 LAB — CBC
HCT: 33 % — ABNORMAL LOW (ref 36.0–46.0)
Hemoglobin: 10.7 g/dL — ABNORMAL LOW (ref 12.0–15.0)
MCH: 26.4 pg (ref 26.0–34.0)
MCHC: 32.4 g/dL (ref 30.0–36.0)
MCV: 81.5 fL (ref 78.0–100.0)
Platelets: 230 10*3/uL (ref 150–400)
RBC: 4.05 MIL/uL (ref 3.87–5.11)
RDW: 13.4 % (ref 11.5–15.5)
WBC: 5.3 10*3/uL (ref 4.0–10.5)

## 2011-07-25 LAB — POCT I-STAT, CHEM 8
BUN: 14 mg/dL (ref 6–23)
Calcium, Ion: 1.08 mmol/L — ABNORMAL LOW (ref 1.12–1.32)
Chloride: 106 meq/L (ref 96–112)
Creatinine, Ser: 0.7 mg/dL (ref 0.50–1.10)
Glucose, Bld: 111 mg/dL — ABNORMAL HIGH (ref 70–99)
HCT: 34 % — ABNORMAL LOW (ref 36.0–46.0)
Hemoglobin: 11.6 g/dL — ABNORMAL LOW (ref 12.0–15.0)
Potassium: 4.1 meq/L (ref 3.5–5.1)
Sodium: 142 meq/L (ref 135–145)
TCO2: 24 mmol/L (ref 0–100)

## 2011-07-25 MED ORDER — LORAZEPAM 2 MG/ML IJ SOLN
1.0000 mg | Freq: Once | INTRAMUSCULAR | Status: AC
  Administered 2011-07-25: 1 mg via INTRAVENOUS
  Filled 2011-07-25: qty 1

## 2011-07-25 MED ORDER — FAMOTIDINE IN NACL 20-0.9 MG/50ML-% IV SOLN
20.0000 mg | Freq: Once | INTRAVENOUS | Status: AC
  Administered 2011-07-25: 20 mg via INTRAVENOUS
  Filled 2011-07-25: qty 50

## 2011-07-25 MED ORDER — DIPHENHYDRAMINE HCL 25 MG PO CAPS: 25.0000 mg | ORAL_CAPSULE | Freq: Four times a day (QID) | ORAL | Status: AC | PRN

## 2011-07-25 MED ORDER — FAMOTIDINE 20 MG PO TABS: 20.0000 mg | ORAL_TABLET | Freq: Two times a day (BID) | ORAL | Status: DC

## 2011-07-25 MED ORDER — METHYLPREDNISOLONE SODIUM SUCC 125 MG IJ SOLR
125.0000 mg | Freq: Once | INTRAMUSCULAR | Status: AC
  Administered 2011-07-25: 125 mg via INTRAVENOUS
  Filled 2011-07-25: qty 2

## 2011-07-25 MED ORDER — ALBUTEROL SULFATE (5 MG/ML) 0.5% IN NEBU
5.0000 mg | INHALATION_SOLUTION | Freq: Once | RESPIRATORY_TRACT | Status: AC
  Administered 2011-07-25: 5 mg via RESPIRATORY_TRACT
  Filled 2011-07-25: qty 1

## 2011-07-25 MED ORDER — PREDNISONE 20 MG PO TABS: 60.0000 mg | ORAL_TABLET | Freq: Every day | ORAL | Status: AC

## 2011-07-25 MED ORDER — DIPHENHYDRAMINE HCL 50 MG/ML IJ SOLN
25.0000 mg | Freq: Once | INTRAMUSCULAR | Status: AC
  Administered 2011-07-25: 25 mg via INTRAVENOUS
  Filled 2011-07-25: qty 1

## 2011-07-25 NOTE — ED Notes (Signed)
Pt has some swelling to uvula on exam

## 2011-07-25 NOTE — ED Provider Notes (Signed)
History     CSN: [id number]  Arrival date & time 07/25/11  0307   First MD Initiated Contact with Patient 07/25/11 [phone]      Chief Complaint  Patient presents with  . Shortness of Breath    (Consider location/radiation/quality/duration/timing/severity/associated sxs/prior treatment) Patient is a 46 y.o. female presenting with shortness of breath. The history is provided by the patient. No language interpreter was used.  Shortness of Breath  The current episode started today (awoke her from sleeping. felt like throat closing and difficulty swallowing). The onset was sudden. The problem occurs continuously. The problem has been unchanged. The problem is moderate. The symptoms are relieved by nothing. The symptoms are aggravated by nothing. Associated symptoms include shortness of breath. Pertinent negatives include no chest pain, no chest pressure, no orthopnea, no fever, no rhinorrhea, no sore throat and no cough. She was not exposed to toxic fumes. She has not inhaled smoke recently. She has had intermittent steroid use. She has had no prior hospitalizations. She has had no prior ICU admissions. She has had no prior intubations. Her past medical history is significant for asthma.    Past Medical History  Diagnosis Date  . Hyperlipidemia   . HYPOTHYROIDISM 01/30/2007  . 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/2007  . ASTHMA 01/30/2007  . GERD 01/31/2007  . Hiatal hernia   . 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 Hemorrhoids     Past Surgical History  Procedure Date  . Abdominal hysterectomy   . Tubal ligation 1987  . Broken jaw   . Breast reduction surgery 1986  . Carpal tunnel release 2001  . Thyroidectomy     Family History  Problem Relation Age of Onset  . Hypertension Father   . Breast cancer Sister   . Colon cancer Neg Hx   . Diabetes Maternal Grandmother   . Heart disease Maternal Grandmother     History  Substance Use Topics  . Smoking status: Former Smoker    Quit date:  06/05/1979  . Smokeless tobacco: Not on file  . Alcohol Use: No    OB History    Grav Para Term Preterm Abortions TAB SAB Ect Mult Living                  Review of Systems  Constitutional: Negative for fever, activity change, appetite change and fatigue.  HENT: Negative for congestion, sore throat, rhinorrhea, neck pain and neck stiffness.   Respiratory: Positive for shortness of breath. Negative for cough.   Cardiovascular: Negative for chest pain, palpitations and orthopnea.  Gastrointestinal: Negative for nausea, vomiting and abdominal pain.  Genitourinary: Negative for dysuria, urgency, frequency and flank pain.  Neurological: Negative for dizziness, weakness, light-headedness, numbness and headaches.  Psychiatric/Behavioral: The patient is nervous/anxious.   All other systems reviewed and are negative.    Allergies  Codeine  Home Medications   Current Outpatient Rx  Name Route Sig Dispense Refill  . ALBUTEROL SULFATE HFA 108 (90 BASE) MCG/ACT IN AERS Inhalation Inhale 2 puffs into the lungs every 4 (four) hours as needed.      [name] LEVOTHYROXINE SODIUM 125 MCG PO TABS Oral Take 1 tablet (125 mcg total) by mouth daily. 30 tablet 11  . OMEPRAZOLE 20 MG PO CPDR Oral Take 1 capsule (20 mg total) by mouth daily. 90 capsule 3  . DIPHENHYDRAMINE HCL 25 MG PO CAPS Oral Take 1 capsule (25 mg total) by mouth every 6 (six) hours as needed for itching. 30 capsule 0  .  FAMOTIDINE 20 MG PO TABS Oral Take 1 tablet (20 mg total) by mouth 2 (two) times daily. 30 tablet 0  . PREDNISONE 20 MG PO TABS Oral Take 3 tablets (60 mg total) by mouth daily. 15 tablet 0    BP 133/91  Pulse 98  Temp(Src) 98.1 F (36.7 C) (Oral)  Resp 18  SpO2 100%  LMP 07/23/2011  Physical Exam  Nursing note and vitals reviewed. Constitutional: She is oriented to person, place, and time. She appears well-developed and well-nourished. No distress.  HENT:  Head: Normocephalic and atraumatic.  Mouth/Throat:  Oropharynx is clear and moist. No oropharyngeal exudate.       No signs of angioedema  Eyes: Conjunctivae and EOM are normal. Pupils are equal, round, and reactive to light.  Neck: Normal range of motion. Neck supple.       No palpable soft tissue mass appreciated in neck.  Cardiovascular: Normal rate, regular rhythm, normal heart sounds and intact distal pulses.  Exam reveals no gallop and no friction rub.   No murmur heard. Pulmonary/Chest: Effort normal. She has wheezes (faint wheeze with normal air movement bilaterally).  Abdominal: Soft. Bowel sounds are normal. There is no tenderness.  Musculoskeletal: Normal range of motion. She exhibits no edema and no tenderness.  Lymphadenopathy:    She has no cervical adenopathy.  Neurological: She is alert and oriented to person, place, and time.  Skin: Skin is warm and dry. No rash noted.    ED Course  Procedures (including critical care time)   Date: 07/25/2011  Rate: 95  Rhythm: normal sinus rhythm  QRS Axis: normal  Intervals: normal  ST/T Wave abnormalities: normal  Conduction Disutrbances:none  Narrative Interpretation:   Old EKG Reviewed: none available  Labs Reviewed  CBC - Abnormal; Notable for the following:    Hemoglobin 10.7 (*)    HCT 33.0 (*)    All other components within normal limits  POCT I-STAT, CHEM 8 - Abnormal; Notable for the following:    Glucose, Bld 111 (*)    Calcium, Ion 1.08 (*)    Hemoglobin 11.6 (*)    HCT 34.0 (*)    All other components within normal limits  DIFFERENTIAL   Ct Soft Tissue Neck Wo Contrast  07/25/2011  *RADIOLOGY REPORT*  Clinical Data: Right-sided neck swelling, shortness of breath and dysphagia.  CT NECK WITHOUT CONTRAST  Technique:  Multidetector CT imaging of the neck was performed without intravenous contrast.  Comparison: CT of the head performed 09/16/2003  Findings: No significant soft tissue abnormalities are characterized.  The nasopharynx, oropharynx and hypopharynx are  unremarkable in appearance.  The palatine tonsils and adenoids are within normal limits.  Parapharyngeal fat planes are preserved.  No cervical lymphadenopathy is seen.  The epiglottis is normal in thickness.  The parotid and submandibular glands are unremarkable in appearance.  The vocal cords are grossly unremarkable in appearance.  The proximal trachea is within normal limits.  The thyroid gland is diminutive and grossly unremarkable.  The vasculature is not well assessed without contrast.  The superior mediastinum is grossly unremarkable in appearance.  The great vessels are within normal limits.  The visualized lung apices are clear.  The visualized portions of the brain are unremarkable in appearance.  The visualized portions of the orbits are within normal limits. The visualized paranasal sinuses and mastoid air cells are well-aerated.  No acute osseous abnormalities are identified.  Remaining dentition is grossly unremarkable in appearance.  Mild degenerative change is  noted along the lower cervical spine, with small anterior and posterior disc osteophyte complexes noted at multiple levels.  IMPRESSION:  1.  No significant soft tissue abnormalities seen.  The nasopharynx, oropharynx and hypopharynx are within normal limits. 2.  Mild degenerative change along the lower cervical spine.  Original Report Authenticated By: Tonia Ghent, M.D.     1. Dyspnea   2. Asthma attack       MDM  Patient with mild asthma exacerbation. I feel the majority of her dyspnea may be secondary to anxiety. She had a subjective feeling that her throat was closing. I also feel that she has a hint of reflux as the symptoms began while lying down. I treated her with prednisone, Benadryl, Pepcid to cover for the possibility of an allergic type reaction however feel this is unlikely. Once again I feel is most likely secondary to anxiety and reflux. A CT scan was performed as she felt like she had difficulty swallowing  secondary to a lump in her throat. This was unremarkable. Will be discharged home with instructions to followup with her primary care physician        Dayton Bailiff, MD 07/25/11 [phone]

## 2011-07-25 NOTE — ED Notes (Signed)
Pt is here with sob.  She states that she woke up with a sensation that her throat is closing up and sob.  Pt denies any other allergic type reactions.  Pt is speaking in full sentences in triage

## 2011-07-26 ENCOUNTER — Other Ambulatory Visit: Payer: BC Managed Care – PPO | Admitting: Lab

## 2011-07-26 ENCOUNTER — Encounter: Payer: Self-pay | Admitting: Oncology

## 2011-07-26 ENCOUNTER — Ambulatory Visit (HOSPITAL_BASED_OUTPATIENT_CLINIC_OR_DEPARTMENT_OTHER): Payer: BC Managed Care – PPO | Admitting: Physician Assistant

## 2011-07-26 ENCOUNTER — Telehealth: Payer: Self-pay | Admitting: Oncology

## 2011-07-26 ENCOUNTER — Other Ambulatory Visit: Payer: Self-pay | Admitting: Oncology

## 2011-07-26 VITALS — BP 151/99 | HR 107 | Temp 97.4°F | Ht 64.0 in | Wt 203.2 lb

## 2011-07-26 DIAGNOSIS — D509 Iron deficiency anemia, unspecified: Secondary | ICD-10-CM

## 2011-07-26 DIAGNOSIS — D649 Anemia, unspecified: Secondary | ICD-10-CM

## 2011-07-26 LAB — CBC WITH DIFFERENTIAL/PLATELET
Basophils Absolute: 0 10*3/uL (ref 0.0–0.1)
EOS%: 0 % (ref 0.0–7.0)
Eosinophils Absolute: 0 10*3/uL (ref 0.0–0.5)
HGB: 11.1 g/dL — ABNORMAL LOW (ref 11.6–15.9)
MCH: 27.3 pg (ref 25.1–34.0)
MONO#: 0.5 10*3/uL (ref 0.1–0.9)
NEUT#: 9 10*3/uL — ABNORMAL HIGH (ref 1.5–6.5)
RDW: 14 % (ref 11.2–14.5)
WBC: 11.1 10*3/uL — ABNORMAL HIGH (ref 3.9–10.3)
lymph#: 1.6 10*3/uL (ref 0.9–3.3)

## 2011-07-26 LAB — COMPREHENSIVE METABOLIC PANEL
AST: 15 U/L (ref 0–37)
Albumin: 4 g/dL (ref 3.5–5.2)
Alkaline Phosphatase: 63 U/L (ref 39–117)
Glucose, Bld: 128 mg/dL — ABNORMAL HIGH (ref 70–99)
Potassium: 3.7 mEq/L (ref 3.5–5.3)
Sodium: 140 mEq/L (ref 135–145)
Total Protein: 7.2 g/dL (ref 6.0–8.3)

## 2011-07-26 LAB — IRON AND TIBC: Iron: 35 ug/dL — ABNORMAL LOW (ref 42–145)

## 2011-07-26 NOTE — Progress Notes (Signed)
Patient has history of recurrent iron deficiency requiring IV iron, most recently on 6/8 and 11/17/10--Feraheme 500 mg. per dose.    Ferritin today was 73, down from 287 on 03/09/11.    We will schedule patient for more Feraheme  to be given about 2/27 and 3/6--2 doses of 500 mg each.  Patient has an appointment to see me again in about 4 months--mid June 2013.

## 2011-07-26 NOTE — Telephone Encounter (Signed)
Gv pt appt for [id number] 

## 2011-07-26 NOTE — Progress Notes (Signed)
[facility] OFFICE PROGRESS NOTE  Oliver Barre, MD, MD  CC: Corwin Levins, MD  Alfonse Alpers. Dagoberto Ligas, M.D.  Janine Limbo, M.D.  Venita Lick. Russella Dar, MD, Clementeen Graham   HISTORY:  Marilyn [name] was seen today for followup of her iron deficiency anemia felt to be due to excessive menstrual losses.  Marilyn [name] was last seen by [place] on 11/07/2010.  At that time her ferritin was 47, hemoglobin 11.2, hematocrit 33.3 and red cell indices were somewhat low.   Marilyn [name] received IV Feraheme 510 mg on 03/09/2011 .  Hemoglobin and hematocrit were essentially unchanged at 11.2 and 33.5 respectively, although the MCV and MCH had improved.  Marilyn [name] tolerated those infusions without any problems.  She denies any of pagophagia in fact has not had any pagophagia for some time although she did have it in the past.  She denies any obvious blood in her stools although apparently a week ago she said she had very dark stools for reasons that are unclear.  She was not on iron or Pepto-Bismol.  I did not have specifically about her menses but apparently in the past she was having infrequent periods. Her last one was from 1/14 till the 17th without significant blood loss.   The patient retains her GYN followup through Dr. Miguel Aschoff.  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  She was seen at the ER on 2/20 due to SOB , feeling her "throat was clogging" . CT neck without contrast was unremarkable. She was given IV Solumedrol and IV Pepcid with some relief. She is due to see Dr. Jonny Ruiz for her primary care issues. She is "tired all the time" , suspecting "is the thyroid". She also complains of back pain with movement. Yesterday, she had chest pain as well, but her EKG was negative. It will be recalled that we had referred the patient back to Dr. Claudette Head.  He carried out endoscopy and colonoscopy on 12/25/2010.   MEDICAL HISTORY: Past Medical History  Diagnosis Date  .  Hyperlipidemia   . HYPOTHYROIDISM 01/30/2007  . 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/2007  . ASTHMA 01/30/2007  . GERD 01/31/2007  . Hiatal hernia   . 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 Hemorrhoids     SURGICAL HISTORY:  Past Surgical History  Procedure Date  . Abdominal hysterectomy   . Tubal ligation 1987  . Broken jaw   . Breast reduction surgery 1986  . Carpal tunnel release 2001  . Thyroidectomy     MEDICATIONS: Current Outpatient Prescriptions  Medication Sig Dispense Refill  . albuterol (VENTOLIN HFA) 108 (90 BASE) MCG/ACT inhaler Inhale 2 puffs into the lungs every 4 (four) hours as needed.        . diphenhydrAMINE (BENADRYL) 25 mg capsule Take 1 capsule (25 mg total) by mouth every 6 (six) hours as needed for itching.  30 capsule  0  . famotidine (PEPCID) 20 MG tablet Take 1 tablet (20 mg total) by mouth 2 (two) times daily.  30 tablet  0  . levothyroxine (SYNTHROID, LEVOTHROID) 125 MCG tablet Take 1 tablet (125 mcg total) by mouth daily.  30 tablet  11  . omeprazole (PRILOSEC) 20 MG capsule Take 1 capsule (20 mg total) by mouth daily.  90 capsule  3  . predniSONE (DELTASONE) 20 MG tablet Take 3 tablets (60 mg total) by mouth daily.  15 tablet  0    ALLERGIES:  is  allergic to codeine.  REVIEW OF SYSTEMS:  The rest of the 14-point review of system was negative.   Physical exam: Alert, oriented .  There is no scleral icterus.  Mouth and pharynx are benign.  No peripheral adenopathy palpable.  Heart/Lungs:  Normal.  O2 saturation on room air at rest was 100%.  The patient did report slight catches in her breathing which I am somewhat doubtful has an organic basis.  She denies anxiety.  Abdomen:  Obese, nontender with no organomegaly or masses palpable.  Extremities:  No peripheral edema or clubbing.    Filed Vitals:   07/26/11 0941  BP: 151/99  Pulse: 107  Temp: 97.4 F (36.3 C)   Wt Readings from Last 3 Encounters:  07/26/11 203 lb 3.2 oz (92.171 kg)  04/30/11 202 lb 8 oz  (91.853 kg)  03/23/11 204 lb 6 oz (92.704 kg)      LABORATORY/RADIOLOGY DATA:   Lab 07/26/11 0846 07/25/11 0431 07/25/11 0420  WBC 11.1* -- 5.3  HGB 11.1* 11.6* 10.7*  HCT 34.0* 34.0* 33.0*  PLT 284 -- 230  MCV 83.7 -- 81.5  MCH 27.3 -- 26.4  MCHC 32.7 -- 32.4  RDW 14.0 -- 13.4  LYMPHSABS 1.6 -- 2.5  MONOABS 0.5 -- 0.5  EOSABS 0.0 -- 0.1  BASOSABS 0.0 -- 0.0  BANDABS -- -- --    CMP    Lab 07/25/11 0431  NA 142  K 4.1  CL 106  CO2 --  GLUCOSE 111*  BUN 14  CREATININE 0.70  CALCIUM --  MG --  AST --  ALT --  ALKPHOS --  BILITOT --        Component Value Date/Time   BILITOT 0.3 03/09/2011 1125   BILITOT 0.3 03/09/2011 1125   BILIDIR 0.1 12/21/2009 1252     Radiology Studies:  Ct Soft Tissue Neck Wo Contrast  07/25/2011  *RADIOLOGY REPORT*  Clinical Data: Right-sided neck swelling, shortness of breath and dysphagia.  CT NECK WITHOUT CONTRAST  Technique:  Multidetector CT imaging of the neck was performed without intravenous contrast.  Comparison: CT of the head performed 09/16/2003  Findings: No significant soft tissue abnormalities are characterized.  The nasopharynx, oropharynx and hypopharynx are unremarkable in appearance.  The palatine tonsils and adenoids are within normal limits.  Parapharyngeal fat planes are preserved.  No cervical lymphadenopathy is seen.  The epiglottis is normal in thickness.  The parotid and submandibular glands are unremarkable in appearance.  The vocal cords are grossly unremarkable in appearance.  The proximal trachea is within normal limits.  The thyroid gland is diminutive and grossly unremarkable.  The vasculature is not well assessed without contrast.  The superior mediastinum is grossly unremarkable in appearance.  The great vessels are within normal limits.  The visualized lung apices are clear.  The visualized portions of the brain are unremarkable in appearance.  The visualized portions of the orbits are within normal limits.  The visualized paranasal sinuses and mastoid air cells are well-aerated.  No acute osseous abnormalities are identified.  Remaining dentition is grossly unremarkable in appearance.  Mild degenerative change is noted along the lower cervical spine, with small anterior and posterior disc osteophyte complexes noted at multiple levels.  IMPRESSION:  1.  No significant soft tissue abnormalities seen.  The nasopharynx, oropharynx and hypopharynx are within normal limits. 2.  Mild degenerative change along the lower cervical spine.  Original Report Authenticated By: Tonia Ghent, M.D.       ASSESSMENT  AND PLAN:   Marilyn [name]'s hemoglobin and hematocrit is 11.1 and 34.7   Her MCV is 83.7.  Last Iron on 10/5 is 47, TIBC 339, MCV  83.1 and Ferritin 287 .We are waiting her new iron studies.  If she appears to be developing iron deficiency again, we will go ahead and arrange for her to have some Feraheme.  Otherwise will plan to see her again in about 4 months at which time we will again check CBC, chemistries and iron studies. She has been instructed to contact her primary doctor for her other medical issues to improve her clinical status.

## 2011-07-27 ENCOUNTER — Telehealth: Payer: Self-pay | Admitting: Oncology

## 2011-07-27 NOTE — Telephone Encounter (Signed)
L/M with appt for 2/27 and that she can p/u other appts at that time aom

## 2011-08-01 ENCOUNTER — Ambulatory Visit (HOSPITAL_BASED_OUTPATIENT_CLINIC_OR_DEPARTMENT_OTHER): Payer: BC Managed Care – PPO

## 2011-08-01 VITALS — BP 150/91 | HR 88 | Temp 98.5°F

## 2011-08-01 DIAGNOSIS — D509 Iron deficiency anemia, unspecified: Secondary | ICD-10-CM

## 2011-08-01 MED ORDER — FERUMOXYTOL INJECTION 510 MG/17 ML
510.0000 mg | Freq: Once | INTRAVENOUS | Status: AC
  Administered 2011-08-01: 510 mg via INTRAVENOUS
  Filled 2011-08-01: qty 17

## 2011-08-01 MED ORDER — SODIUM CHLORIDE 0.9 % IV SOLN: Freq: Once | INTRAVENOUS | Status: DC

## 2011-08-08 ENCOUNTER — Ambulatory Visit (HOSPITAL_BASED_OUTPATIENT_CLINIC_OR_DEPARTMENT_OTHER): Payer: BC Managed Care – PPO

## 2011-08-08 VITALS — BP 158/90 | HR 113 | Temp 98.1°F

## 2011-08-08 DIAGNOSIS — D509 Iron deficiency anemia, unspecified: Secondary | ICD-10-CM

## 2011-08-08 MED ORDER — FERUMOXYTOL INJECTION 510 MG/17 ML
510.0000 mg | Freq: Once | INTRAVENOUS | Status: AC
  Administered 2011-08-08: 510 mg via INTRAVENOUS
  Filled 2011-08-08: qty 17

## 2011-08-08 MED ORDER — SODIUM CHLORIDE 0.9 % IV SOLN
Freq: Once | INTRAVENOUS | Status: AC
  Administered 2011-08-08: 15:00:00 via INTRAVENOUS

## 2011-08-14 ENCOUNTER — Encounter: Payer: Self-pay | Admitting: Oncology

## 2011-08-28 ENCOUNTER — Encounter: Payer: Self-pay | Admitting: Oncology

## 2011-08-28 NOTE — Progress Notes (Signed)
Patient approved for 90% EPP discount, for family of 2 income is [phone]

## 2011-09-03 ENCOUNTER — Telehealth: Payer: Self-pay | Admitting: Medical Oncology

## 2011-09-03 NOTE — Telephone Encounter (Signed)
Pt called stating there is a small knot where she had her feraheme. I asked her was it swollen, hot to touch or painful. She states no. I explained it is from the needle stick and she can put warm compresses on it. If it changes and gets worse she is to call us so we can evaluate it.

## 2011-09-04 ENCOUNTER — Telehealth: Payer: Self-pay

## 2011-09-04 NOTE — Telephone Encounter (Signed)
Pt called stating that her R arm is feeling achey and heavy "like dead weight". Has pain from shoulder to elbow crease like a pulled muscle. Is not swollen or hot. She describes a knot on the top of the shoulder blade that moves and is about pea sized that she did not notice yesterday. I instructed pt to call her PCP right away and if she cannot get into PCP or it gets worse in the night to go to ER.

## 2011-09-05 ENCOUNTER — Encounter: Payer: Self-pay | Admitting: Internal Medicine

## 2011-09-05 ENCOUNTER — Ambulatory Visit (INDEPENDENT_AMBULATORY_CARE_PROVIDER_SITE_OTHER): Payer: BC Managed Care – PPO | Admitting: Internal Medicine

## 2011-09-05 VITALS — BP 142/100 | HR 96 | Temp 97.8°F | Ht 64.0 in | Wt 202.1 lb

## 2011-09-05 DIAGNOSIS — M542 Cervicalgia: Secondary | ICD-10-CM

## 2011-09-05 DIAGNOSIS — I10 Essential (primary) hypertension: Secondary | ICD-10-CM

## 2011-09-05 DIAGNOSIS — K219 Gastro-esophageal reflux disease without esophagitis: Secondary | ICD-10-CM

## 2011-09-05 DIAGNOSIS — D509 Iron deficiency anemia, unspecified: Secondary | ICD-10-CM

## 2011-09-05 DIAGNOSIS — R079 Chest pain, unspecified: Secondary | ICD-10-CM

## 2011-09-05 MED ORDER — LOSARTAN POTASSIUM 100 MG PO TABS: 100.0000 mg | ORAL_TABLET | Freq: Every day | ORAL | Status: DC

## 2011-09-05 MED ORDER — NAPROXEN 500 MG PO TABS: 500.0000 mg | ORAL_TABLET | Freq: Two times a day (BID) | ORAL | Status: DC

## 2011-09-05 MED ORDER — OMEPRAZOLE 20 MG PO CPDR: 20.0000 mg | DELAYED_RELEASE_CAPSULE | Freq: Two times a day (BID) | ORAL | Status: DC

## 2011-09-05 NOTE — Patient Instructions (Addendum)
Take all new medications as prescribed - the anti-inflammatory pain medication, and the blood pressure medicine (losartan) Continue all other medications as before, except ok to stop the pepcid Your prilosec was increased to twice per day You will be contacted regarding the referral for: stress test Please return in 1 month

## 2011-09-06 ENCOUNTER — Telehealth: Payer: Self-pay

## 2011-09-06 MED ORDER — LISINOPRIL 20 MG PO TABS: 20.0000 mg | ORAL_TABLET | Freq: Every day | ORAL | Status: DC

## 2011-09-06 NOTE — Telephone Encounter (Signed)
Done escript 

## 2011-09-06 NOTE — Telephone Encounter (Signed)
Patient called to inform the BP medication prescribed at [facility] on 09/05/11 is too expensive and would like a BP med from the $4 list. Please advise call back number is [phone]

## 2011-09-07 NOTE — Telephone Encounter (Signed)
Called patient informed prescription sent in.

## 2011-09-09 ENCOUNTER — Encounter: Payer: Self-pay | Admitting: Internal Medicine

## 2011-09-09 DIAGNOSIS — M542 Cervicalgia: Secondary | ICD-10-CM | POA: Insufficient documentation

## 2011-09-09 DIAGNOSIS — I10 Essential (primary) hypertension: Secondary | ICD-10-CM

## 2011-09-09 DIAGNOSIS — E119 Type 2 diabetes mellitus without complications: Secondary | ICD-10-CM | POA: Insufficient documentation

## 2011-09-09 HISTORY — DX: Cervicalgia: M54.2

## 2011-09-09 HISTORY — DX: Essential (primary) hypertension: I10

## 2011-09-09 NOTE — Assessment & Plan Note (Signed)
Aytpical, for stress test,  to f/u any worsening symptoms or concerns

## 2011-09-09 NOTE — Progress Notes (Signed)
Subjective:    Patient ID: Marilyn [name], female    DOB: 11-02-1965, 46 y.o.   MRN: [id number]  HPI  Here to f/u;  Pt denies increased sob or doe, wheezing, orthopnea, PND, increased LE swelling, palpitations, dizziness or syncope, but has had intermittent CP, dull, mid chest wtihout radiation, n/v, diaphoresis, sob for approx 1 wk.  Not clearly exertional or pleuritic.  No HA, fever, ST, cough.  Pt denies new neurological symptoms such as new headache, or facial or extremity weakness or numbness   Pt denies polydipsia, polyuria.  Has had worsening reflux without dysphagia, abd pan, n/v, wt loss, blood in the past month.  BP has been elevated several times recently.  Also with several days mild incidental right neck pain with some radiation to the right upper back only, worse to turn the head to the right.  Gma died at 60 with heart disease, mother died at 65 with asthma - pt very concerned given her age Past Medical History  Diagnosis Date  . Hyperlipidemia   . HYPOTHYROIDISM 01/30/2007  . 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/2007  . ASTHMA 01/30/2007  . GERD 01/31/2007  . Hiatal hernia   . 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 Hemorrhoids    Past Surgical History  Procedure Date  . Abdominal hysterectomy   . Tubal ligation 1987  . Broken jaw   . Breast reduction surgery 1986  . Carpal tunnel release 2001  . Thyroidectomy     reports that she quit smoking about 32 years ago. She does not have any smokeless tobacco history on file. She reports that she does not drink alcohol or use illicit drugs. family history includes Breast cancer in her sister; Diabetes in her maternal grandmother; Heart disease in her maternal grandmother; and Hypertension in her father.  There is no history of Colon cancer. Allergies  Allergen Reactions  . Codeine Other (See Comments)    Makes really sick   Current Outpatient Prescriptions on File Prior to Visit  Medication Sig Dispense Refill  . albuterol (VENTOLIN HFA) 108  (90 BASE) MCG/ACT inhaler Inhale 2 puffs into the lungs every 4 (four) hours as needed.        [name] levothyroxine (SYNTHROID, LEVOTHROID) 125 MCG tablet Take 1 tablet (125 mcg total) by mouth daily.  30 tablet  11  . omeprazole (PRILOSEC) 20 MG capsule Take 1 capsule (20 mg total) by mouth 2 (two) times daily.  180 capsule  3  . lisinopril (PRINIVIL,ZESTRIL) 20 MG tablet Take 1 tablet (20 mg total) by mouth daily.  90 tablet  3   ,Review of Systems Review of Systems  Constitutional: Negative for diaphoresis and unexpected weight change.  Respiratory: Negative for choking and stridor.   Gastrointestinal: Negative for vomiting and blood in stool.  Genitourinary: Negative for hematuria and decreased urine volume.  Musculoskeletal: Negative for gait problem.  Skin: Negative for color change and wound.  Neurological: Negative for tremors and numbness.  Psychiatric/Behavioral: Negative for decreased concentration. The patient is not hyperactive.       Objective:   Physical Exam BP 142/100  Pulse 96  Temp(Src) 97.8 F (36.6 C) (Oral)  Ht 5\' 4"  (1.626 m)  Wt 202 lb 2 oz (91.683 kg)  BMI 34.69 kg/m2  SpO2 96% Physical Exam  VS noted, no ill appearing Constitutional: Pt appears well-developed and well-nourished.  HENT: Head: Normocephalic.  Right Ear: External ear normal.  Left Ear: External ear normal.  Eyes: Conjunctivae and EOM are normal.  Pupils are equal, round, and reactive to light.  Neck: Normal range of motion. Neck supple. Nontender Cardiovascular: Normal rate and regular rhythm.   Pulmonary/Chest: Effort normal and breath sounds normal.  Abd:  Soft, NT, non-distended, + BS Neurological: Pt is alert. No cranial nerve deficit. Motor/sens/dtr intact to UE's  Skin: Skin is warm. No erythema.  Psychiatric: Pt behavior is normal. Thought content normal. 1+ nervous    Assessment & Plan:

## 2011-09-09 NOTE — Assessment & Plan Note (Signed)
To change back to PPI,  to f/u any worsening symptoms or concerns

## 2011-09-09 NOTE — Assessment & Plan Note (Signed)
New onset, for low salt diet, wt loss, and losartan asd,  to f/u any worsening symptoms or concerns

## 2011-09-09 NOTE — Assessment & Plan Note (Signed)
Unclear etiology, exam benign, suspect msk vs radiculitis, for nsaid prn

## 2011-09-24 ENCOUNTER — Emergency Department (HOSPITAL_COMMUNITY)
Admission: EM | Admit: 2011-09-24 | Discharge: 2011-09-24 | Disposition: A | Discharge status: Home / Self Care | Payer: BC Managed Care – PPO | Attending: Emergency Medicine | Admitting: Emergency Medicine

## 2011-09-24 ENCOUNTER — Encounter (HOSPITAL_COMMUNITY): Payer: Self-pay | Admitting: *Deleted

## 2011-09-24 DIAGNOSIS — R040 Epistaxis: Secondary | ICD-10-CM

## 2011-09-24 DIAGNOSIS — J040 Acute laryngitis: Secondary | ICD-10-CM | POA: Insufficient documentation

## 2011-09-24 DIAGNOSIS — E039 Hypothyroidism, unspecified: Secondary | ICD-10-CM | POA: Insufficient documentation

## 2011-09-24 DIAGNOSIS — R1110 Vomiting, unspecified: Secondary | ICD-10-CM | POA: Insufficient documentation

## 2011-09-24 DIAGNOSIS — I10 Essential (primary) hypertension: Secondary | ICD-10-CM | POA: Insufficient documentation

## 2011-09-24 DIAGNOSIS — E785 Hyperlipidemia, unspecified: Secondary | ICD-10-CM | POA: Insufficient documentation

## 2011-09-24 MED ORDER — IBUPROFEN 200 MG PO TABS
400.0000 mg | ORAL_TABLET | Freq: Once | ORAL | Status: AC
  Administered 2011-09-24: 400 mg via ORAL
  Filled 2011-09-24: qty 2

## 2011-09-24 MED ORDER — OXYCODONE-ACETAMINOPHEN 5-325 MG PO TABS
2.0000 | ORAL_TABLET | Freq: Once | ORAL | Status: AC
  Administered 2011-09-24: 2 via ORAL
  Filled 2011-09-24: qty 1

## 2011-09-24 MED ORDER — OXYCODONE-ACETAMINOPHEN 5-325 MG PO TABS
ORAL_TABLET | ORAL | Status: AC
  Filled 2011-09-24: qty 1

## 2011-09-24 MED ORDER — ONDANSETRON HCL 8 MG PO TABS
4.0000 mg | ORAL_TABLET | Freq: Once | ORAL | Status: AC
  Administered 2011-09-24: 4 mg via ORAL
  Filled 2011-09-24: qty 2

## 2011-09-24 NOTE — ED Provider Notes (Signed)
History    46 year old female presenting after an episode of vomiting. After she vomited patient has had persistent sore throat. She also noticed that when she blows her nose she notices a small amount of preretinal blood. Patient subsequently lost her voice. No wheezing. No shortness of breath. Single episode of vomiting. He was nonbloody and nonbilious. Denies any abdominal pain. No chest pain or shortness of breath. No diarrhea. No sick contacts. CSN: [id number]  Arrival date & time 09/24/11  2018   First MD Initiated Contact with Patient 09/24/11 2146      Chief Complaint  Patient presents with  . Emesis  . Laryngitis  . Sore Throat    (Consider location/radiation/quality/duration/timing/severity/associated sxs/prior treatment) HPI  Past Medical History  Diagnosis Date  . Hyperlipidemia   . HYPOTHYROIDISM 01/30/2007  . 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/2007  . ASTHMA 01/30/2007  . GERD 01/31/2007  . Hiatal hernia   . 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 Hemorrhoids   . HTN (hypertension) 09/09/2011    Past Surgical History  Procedure Date  . Abdominal hysterectomy   . Tubal ligation 1987  . Broken jaw   . Breast reduction surgery 1986  . Carpal tunnel release 2001  . Thyroidectomy     Family History  Problem Relation Age of Onset  . Hypertension Father   . Breast cancer Sister   . Colon cancer Neg Hx   . Diabetes Maternal Grandmother   . Heart disease Maternal Grandmother     History  Substance Use Topics  . Smoking status: Former Smoker    Quit date: 06/05/1979  . Smokeless tobacco: Not on file  . Alcohol Use: No    OB History    Grav Para Term Preterm Abortions TAB SAB Ect Mult Living                  Review of Systems   Review of symptoms negative unless otherwise noted in HPI.   Allergies  Codeine  Home Medications   Current Outpatient Rx  Name Route Sig Dispense Refill  . ALBUTEROL SULFATE HFA 108 (90 BASE) MCG/ACT IN AERS Inhalation Inhale 2  puffs into the lungs every 4 (four) hours as needed. For shortness of breath.    . CHLORPHENIRAMINE-ACETAMINOPHEN 2-325 MG PO TABS Oral Take 1 tablet by mouth as needed. For cold, cough, and flu symptoms.    [name] FAMOTIDINE 20 MG PO TABS Oral Take 20 mg by mouth 2 (two) times daily.    [name] LEVOTHYROXINE SODIUM 125 MCG PO TABS Oral Take 1 tablet (125 mcg total) by mouth daily. 30 tablet 11  . LISINOPRIL 20 MG PO TABS Oral Take 20 mg by mouth daily.    [name] NAPROXEN 500 MG PO TABS Oral Take 500 mg by mouth 2 (two) times daily with a meal.      BP 143/97  Pulse 94  Temp(Src) 98.1 F (36.7 C) (Oral)  Resp 22  SpO2 100%  LMP 08/24/2011  Physical Exam  Nursing note and vitals reviewed. Constitutional: She appears well-developed and well-nourished. No distress.  HENT:  Head: Normocephalic and atraumatic.  Right Ear: External ear normal.  Left Ear: External ear normal.       Mild posterior pharyngeal erythema. There is no exudate. Uvula is midline. There is no tongue elevation. Patient is handling her secretions. Submental tissues are soft. No stridor. Neck is supple. There is no cervical adenopathy. Voice hoarse. Small area of friability turbinate L nare. No active bleeding.  Eyes: Conjunctivae are normal. Pupils are equal, round, and reactive to light. Right eye exhibits no discharge. Left eye exhibits no discharge.  Neck: Neck supple.  Cardiovascular: Normal rate, regular rhythm and normal heart sounds.  Exam reveals no gallop and no friction rub.   No murmur heard. Pulmonary/Chest: Effort normal and breath sounds normal. No respiratory distress. She has no wheezes.  Abdominal: Soft. She exhibits no distension. There is no tenderness.  Musculoskeletal: She exhibits no edema and no tenderness.  Neurological: She is alert.  Skin: Skin is warm and dry.  Psychiatric: She has a normal mood and affect. Her behavior is normal. Thought content normal.    ED Course  Procedures (including critical care  time)  Labs Reviewed - No data to display No results found.   1. Vomiting   2. Epistaxis       MDM  45yF with vomiting. Suspect a sore throat is secondary to esophageal irritation from vomiting. Patient has no evidence of respiratory compromise. No active epistaxis. Patient is HD stable. Return precautions were discussed. Pain medication as needed. Outpatient followup as needed.        Raeford Razor, MD 09/24/11 2203

## 2011-09-24 NOTE — ED Notes (Signed)
Pt reports PCP gave her antibiotics for ear infection but she has not taken them Pt encouraged to start them ASAP.

## 2011-09-24 NOTE — ED Notes (Signed)
PT ambulated with a steady gait; VSS; A&Ox3; no signs of distress; respirations even and unlabored' skin warm and dry. No questions at this time.  

## 2011-09-24 NOTE — ED Notes (Signed)
PT reports she vomited while at work . She then lost her  Voice and and when she blows her nose there is blood in the mucous.

## 2011-09-24 NOTE — Discharge Instructions (Signed)
Nausea and Vomiting   Nausea means you feel sick to your stomach. Throwing up (vomiting) is a reflex where stomach contents come out of your mouth.   HOME CARE   Take medicine as told by your doctor.   Do not force yourself to eat. However, you do need to drink fluids.   If you feel like eating, eat a normal diet as told by your doctor.   Eat rice, wheat, potatoes, bread, lean meats, yogurt, fruits, and vegetables.   Avoid high-fat foods.   Drink enough fluids to keep your pee (urine) clear or pale yellow.   Ask your doctor how to replace body fluid losses (rehydrate). Signs of body fluid loss (dehydration) include:   Feeling very thirsty.   Dry lips and mouth.   Feeling dizzy.   Dark pee.   Peeing less than normal.   Feeling confused.   Fast breathing or heart rate.   GET HELP RIGHT AWAY IF:   You have blood in your throw up.   You have black or bloody poop (stool).   You have a bad headache or stiff neck.   You feel confused.   You have bad belly (abdominal) pain.   You have chest pain or trouble breathing.   You do not pee at least once every 8 hours.   You have cold, clammy skin.   You keep throwing up after 24 to 48 hours.   You have a fever.   MAKE SURE YOU:   Understand these instructions.   Will watch your condition.   Will get help right away if you are not doing well or get worse.   Document Released: 11/07/2007 Document Revised: 05/10/2011 Document Reviewed: 10/20/2010   [employer] Patient Information 2012 [employer].

## 2011-09-24 NOTE — ED Notes (Signed)
MD at bedside. 

## 2011-09-25 ENCOUNTER — Telehealth: Payer: Self-pay

## 2011-09-25 MED ORDER — AZITHROMYCIN 250 MG PO TABS: ORAL_TABLET | ORAL | Status: AC

## 2011-09-25 NOTE — Telephone Encounter (Signed)
Ok this time only 

## 2011-09-25 NOTE — Telephone Encounter (Signed)
Patient informed. 

## 2011-09-25 NOTE — Telephone Encounter (Signed)
The patient was seen in the ER last night. She is requesting an antibiotic for her ears and throat.  [facility] [address]

## 2011-09-27 ENCOUNTER — Telehealth: Payer: Self-pay

## 2011-09-27 MED ORDER — BENZONATATE 100 MG PO CAPS: ORAL_CAPSULE | ORAL | Status: DC

## 2011-09-27 NOTE — Telephone Encounter (Signed)
Pt called very hoarse from persistent cough. Pt is requesting MD advisement on OTC cough medication or prescription cough medication that is safe to [place] with HTN, please advise.

## 2011-09-27 NOTE — Telephone Encounter (Signed)
Informed the patient of MD's response. The patient would like the tessalon perles sent to pharmacy as they work well for her

## 2011-09-27 NOTE — Telephone Encounter (Signed)
Patient informed cough pills

## 2011-09-27 NOTE — Telephone Encounter (Signed)
Sure, delsym OTC is fine with HTN, and is the only one that is 12 hrs  We can add tess perle if she wants as well

## 2011-09-27 NOTE — Telephone Encounter (Signed)
Done per emr 

## 2011-10-23 ENCOUNTER — Telehealth: Payer: Self-pay

## 2011-10-23 NOTE — Telephone Encounter (Signed)
Received PA for Omeprazole 20 mg BID, plan limit exceeded please advise to proceed with PA or offer alternative

## 2011-10-24 MED ORDER — LOSARTAN POTASSIUM 25 MG PO TABS: 25.0000 mg | ORAL_TABLET | Freq: Every day | ORAL | Status: DC

## 2011-10-24 MED ORDER — PANTOPRAZOLE SODIUM 40 MG PO TBEC: 40.0000 mg | DELAYED_RELEASE_TABLET | Freq: Every day | ORAL | Status: DC

## 2011-10-24 NOTE — Telephone Encounter (Signed)
Ok to try change to generic protonix (I sent to pharmacy)  If this requires PA, please ask pharmacy if generic prevacid 30 qd would not require PA, and ok for that if protonix not ok

## 2011-10-24 NOTE — Telephone Encounter (Signed)
Stop lisinopril Start losartan - ex done

## 2011-10-24 NOTE — Telephone Encounter (Signed)
Called the patient left message to call back 

## 2011-10-24 NOTE — Telephone Encounter (Signed)
Called the pharmacy and they can fill pantoprazole #30 for $12.  Called the patient to inform and is ok.

## 2011-10-24 NOTE — Telephone Encounter (Signed)
The patient called to inform Lisinopril is making her cough and would like an alternative. Please advise

## 2011-10-25 NOTE — Telephone Encounter (Signed)
Left message on machine for pt to return my call  

## 2011-10-30 NOTE — Telephone Encounter (Signed)
Called informed the patient of medication changes.

## 2011-10-30 NOTE — Telephone Encounter (Signed)
Called the patient left message to call back 

## 2011-11-13 ENCOUNTER — Telehealth: Payer: Self-pay | Admitting: Internal Medicine

## 2011-11-13 NOTE — Telephone Encounter (Signed)
Ok to follow advice as per CAN.  Symtpoms very unlikely to be related to the losartan

## 2011-11-13 NOTE — Telephone Encounter (Signed)
Caller: [place]/Patient is calling with a question about Losartan.The medication was written by Oliver Barre.  Patient reports she is concerned about having to take deep breaths.  Stools white / clay colored for a couple days after having very dark stools for a few days.  Advised see provider in 72 hours per Diarrhea or Other Change in Bowel Habits protocol ans Breathing  Problems protocol. Appt. 11/15/11 @ 1600 w Dr. Jonny Ruiz.

## 2011-11-14 NOTE — Telephone Encounter (Signed)
Called the patient left message to call back 

## 2011-11-14 NOTE — Telephone Encounter (Signed)
Called the patient informed of MD's response.  The patient is schedule tomorrow 11/15/11 at 4.

## 2011-11-15 ENCOUNTER — Ambulatory Visit (INDEPENDENT_AMBULATORY_CARE_PROVIDER_SITE_OTHER)
Admission: RE | Admit: 2011-11-15 | Discharge: 2011-11-15 | Disposition: A | Discharge status: Home / Self Care | Payer: BC Managed Care – PPO | Source: Ambulatory Visit | Attending: Internal Medicine | Admitting: Internal Medicine

## 2011-11-15 ENCOUNTER — Other Ambulatory Visit: Payer: Self-pay | Admitting: Internal Medicine

## 2011-11-15 ENCOUNTER — Other Ambulatory Visit (INDEPENDENT_AMBULATORY_CARE_PROVIDER_SITE_OTHER): Payer: BC Managed Care – PPO

## 2011-11-15 ENCOUNTER — Encounter: Payer: Self-pay | Admitting: Internal Medicine

## 2011-11-15 ENCOUNTER — Ambulatory Visit (INDEPENDENT_AMBULATORY_CARE_PROVIDER_SITE_OTHER): Payer: BC Managed Care – PPO | Admitting: Internal Medicine

## 2011-11-15 VITALS — BP 140/100 | HR 89 | Temp 98.1°F | Ht 64.0 in | Wt 196.2 lb

## 2011-11-15 DIAGNOSIS — M25531 Pain in right wrist: Secondary | ICD-10-CM

## 2011-11-15 DIAGNOSIS — M25539 Pain in unspecified wrist: Secondary | ICD-10-CM

## 2011-11-15 DIAGNOSIS — R1011 Right upper quadrant pain: Secondary | ICD-10-CM

## 2011-11-15 DIAGNOSIS — R079 Chest pain, unspecified: Secondary | ICD-10-CM

## 2011-11-15 DIAGNOSIS — M25439 Effusion, unspecified wrist: Secondary | ICD-10-CM

## 2011-11-15 DIAGNOSIS — R109 Unspecified abdominal pain: Secondary | ICD-10-CM | POA: Insufficient documentation

## 2011-11-15 DIAGNOSIS — J209 Acute bronchitis, unspecified: Secondary | ICD-10-CM

## 2011-11-15 DIAGNOSIS — R0600 Dyspnea, unspecified: Secondary | ICD-10-CM

## 2011-11-15 DIAGNOSIS — I10 Essential (primary) hypertension: Secondary | ICD-10-CM

## 2011-11-15 DIAGNOSIS — R0609 Other forms of dyspnea: Secondary | ICD-10-CM

## 2011-11-15 LAB — URINALYSIS, ROUTINE W REFLEX MICROSCOPIC
Bilirubin Urine: NEGATIVE
Leukocytes, UA: NEGATIVE
Nitrite: NEGATIVE
Total Protein, Urine: NEGATIVE

## 2011-11-15 LAB — CBC WITH DIFFERENTIAL/PLATELET
Basophils Absolute: 0 10*3/uL (ref 0.0–0.1)
Eosinophils Relative: 2.5 % (ref 0.0–5.0)
Lymphs Abs: 2.5 10*3/uL (ref 0.7–4.0)
Monocytes Absolute: 0.4 10*3/uL (ref 0.1–1.0)
Monocytes Relative: 7.7 % (ref 3.0–12.0)
Neutrophils Relative %: 41.4 % — ABNORMAL LOW (ref 43.0–77.0)
Platelets: 238 10*3/uL (ref 150.0–400.0)
RDW: 14.4 % (ref 11.5–14.6)
WBC: 5.2 10*3/uL (ref 4.5–10.5)

## 2011-11-15 LAB — HEPATIC FUNCTION PANEL
ALT: 17 U/L (ref 0–35)
AST: 20 U/L (ref 0–37)
Albumin: 3.9 g/dL (ref 3.5–5.2)
Alkaline Phosphatase: 62 U/L (ref 39–117)

## 2011-11-15 LAB — BASIC METABOLIC PANEL: Creatinine, Ser: 0.8 mg/dL (ref 0.4–1.2)

## 2011-11-15 MED ORDER — HYDROCODONE-ACETAMINOPHEN 5-325 MG PO TABS: 1.0000 | ORAL_TABLET | Freq: Four times a day (QID) | ORAL | Status: AC | PRN

## 2011-11-15 MED ORDER — KETOROLAC TROMETHAMINE 30 MG/ML IJ SOLN
30.0000 mg | Freq: Once | INTRAMUSCULAR | Status: AC
  Administered 2011-11-15: 30 mg via INTRAMUSCULAR

## 2011-11-15 MED ORDER — LEVOFLOXACIN 250 MG PO TABS: 250.0000 mg | ORAL_TABLET | Freq: Every day | ORAL | Status: AC

## 2011-11-15 MED ORDER — PROMETHAZINE HCL 25 MG PO TABS: 25.0000 mg | ORAL_TABLET | Freq: Four times a day (QID) | ORAL | Status: DC | PRN

## 2011-11-15 MED ORDER — PANTOPRAZOLE SODIUM 40 MG PO TBEC: 40.0000 mg | DELAYED_RELEASE_TABLET | Freq: Every day | ORAL | Status: DC

## 2011-11-15 NOTE — Progress Notes (Signed)
Subjective:    Patient ID: MYYA MEENACH, female    DOB: 01-08-1966, 46 y.o.   MRN: [id number]  HPI  Here with several complaints.  Here with acute onset mild to mod 5-7 days ST, HA, general weakness and malaise, with prod cough greenish sputum, but Pt denies wheezing, orthopnea, PND, increased LE swelling, palpitations, dizziness or syncope.  Has also had upper abd discomfort, more to the right, that seems to radiate to the chest and seems to press up in that direction especially with trying to stand up out of a chair from sitting, as well as to the back, and has some sob, but no wheezing, inhalers do not help.  Asks for samples of PPI as they are expensive with her insurance, has some recent worsening, and pepcid not helping.  Denies  dysphagia,  or blood but has had some mild nausea, and ? Lighter colored stool as well.   Pt denies polydipsia, polyuria.   Also c/o right wrist pain, mild to mod, after trying to open a jar and heard a pop, hand and wrist quite swollen persistent compared to the left.    Past Medical History  Diagnosis Date  . Hyperlipidemia   . HYPOTHYROIDISM 01/30/2007  . 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/2007  . ASTHMA 01/30/2007  . GERD 01/31/2007  . Hiatal hernia   . 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 Hemorrhoids   . HTN (hypertension) 09/09/2011   Past Surgical History  Procedure Date  . Abdominal hysterectomy   . Tubal ligation 1987  . Broken jaw   . Breast reduction surgery 1986  . Carpal tunnel release 2001  . Thyroidectomy     reports that she quit smoking about 32 years ago. She does not have any smokeless tobacco history on file. She reports that she does not drink alcohol or use illicit drugs. family history includes Breast cancer in her sister; Diabetes in her maternal grandmother; Heart disease in her maternal grandmother; and Hypertension in her father.  There is no history of Colon cancer. Allergies  Allergen Reactions  . Codeine Other (See Comments)    Makes  really sick   Current Outpatient Prescriptions on File Prior to Visit  Medication Sig Dispense Refill  . albuterol (VENTOLIN HFA) 108 (90 BASE) MCG/ACT inhaler Inhale 2 puffs into the lungs every 4 (four) hours as needed. For shortness of breath.      . levothyroxine (SYNTHROID, LEVOTHROID) 125 MCG tablet Take 1 tablet (125 mcg total) by mouth daily.  30 tablet  11  . losartan (COZAAR) 25 MG tablet Take 1 tablet (25 mg total) by mouth daily.  30 tablet  3  . Chlorpheniramine-APAP (CORICIDIN) 2-325 MG TABS Take 1 tablet by mouth as needed. For cold, cough, and flu symptoms.      . promethazine (PHENERGAN) 25 MG tablet Take 1 tablet (25 mg total) by mouth every 6 (six) hours as needed for nausea.  30 tablet  0  . DISCONTD: pantoprazole (PROTONIX) 40 MG tablet Take 1 tablet (40 mg total) by mouth daily.  90 tablet  3   No current facility-administered medications on file prior to visit.   Review of Systems Review of Systems  Constitutional: Negative for diaphoresis and unexpected weight change.  HENT: Negative for drooling and tinnitus.   Eyes: Negative for photophobia and visual disturbance.  Respiratory: Negative for choking and stridor.   Genitourinary: Negative for hematuria and decreased urine volume.  Musculoskeletal: Negative for gait problem.  Skin: Negative for  color change and wound.  Neurological: Negative for tremors and numbness.  Psychiatric/Behavioral: Negative for decreased concentration. The patient is not hyperactive.       Objective:   Physical Exam BP 140/100  Pulse 89  Temp 98.1 F (36.7 C) (Oral)  Ht 5\' 4"  (1.626 m)  Wt 196 lb 4 oz (89.018 kg)  BMI 33.69 kg/m2  SpO2 98% Physical Exam  VS noted, mild ill Constitutional: Pt appears well-developed and well-nourished.  HENT: Head: Normocephalic.  Right Ear: External ear normal.  Left Ear: External ear normal.  Bilat tm's mild erythema.  Sinus nontender.  Pharynx mild erythema Eyes: Conjunctivae and EOM are  normal. Pupils are equal, round, and reactive to light.  Neck: Normal range of motion. Neck supple.  Cardiovascular: Normal rate and regular rhythm.   Pulmonary/Chest: Effort normal and breath sounds normal.  - no rales or wheezing Abd:  Soft, non-distended, + BS but with upper abd tender RUQ mild to mod, no guarding or rebound Neurological: Pt is alert. Not confused, motor/gait intact Skin: Skin is warm. No erythema. No rash Psychiatric: Pt behavior is normal. Thought content normal.  Right wrist and hand trace to 1+ swelling, tender, warm with decreased ROM wrist    Assessment & Plan:

## 2011-11-15 NOTE — Assessment & Plan Note (Addendum)
Etiology unclear, but suspect possible gallbladder inflammation, and with stone passing/passed with change of stool color?  - for labs today and u/s asap, may need surgury consult or GI referral; gave sample of dexilant 2 wks samples  Note:  Time for pt hx, exam, review of record with pt in room, determination of diagnoses, and plan for further eval and tx is > 40 min

## 2011-11-15 NOTE — Assessment & Plan Note (Addendum)
ECG reviewed as per emr, per pt pain radiates from abd, but check cxr r/o pna

## 2011-11-15 NOTE — Patient Instructions (Addendum)
You had the pain shot today (toradol) Take all new medications as prescribed - the antibiotic, pain medication, and nausea medication if needed You are also given the samples of dexilant 60 mg - 1 per day for reflux Please go to LAB in the Basement for the blood and/or urine tests to be done today Please go to XRAY in the Basement for the x-ray test You will be contacted regarding the referral for: Abd U/S - ordered "urgent" so you should be called very soon If the ultrasound shows the gallbladder is inflamed, you will need to see General Surgury Your Blood Pressure was mildly elevated today, but this should improve with treatment of the illness today You will be contacted by phone if any changes need to be made immediately.  Otherwise, you will receive a letter about your results with an explanation.

## 2011-11-15 NOTE — Assessment & Plan Note (Signed)
Mild elev today likely situational, Continue all other medications as before,  to f/u any worsening symptoms or concerns

## 2011-11-15 NOTE — Assessment & Plan Note (Signed)
Also incidental I think, no specific trauma but given the hx will need film - r/o fx, consider hand surgury referral, for pain med

## 2011-11-15 NOTE — Assessment & Plan Note (Signed)
Incidental it seems, Mild to mod, for antibx course,  to f/u any worsening symptoms or concerns

## 2011-11-16 ENCOUNTER — Emergency Department (HOSPITAL_COMMUNITY)
Admission: EM | Admit: 2011-11-16 | Discharge: 2011-11-16 | Disposition: A | Discharge status: Home / Self Care | Payer: BC Managed Care – PPO | Attending: Emergency Medicine | Admitting: Emergency Medicine

## 2011-11-16 ENCOUNTER — Emergency Department (HOSPITAL_COMMUNITY): Payer: BC Managed Care – PPO

## 2011-11-16 ENCOUNTER — Encounter (HOSPITAL_COMMUNITY): Payer: Self-pay | Admitting: *Deleted

## 2011-11-16 DIAGNOSIS — D509 Iron deficiency anemia, unspecified: Secondary | ICD-10-CM | POA: Insufficient documentation

## 2011-11-16 DIAGNOSIS — Z79899 Other long term (current) drug therapy: Secondary | ICD-10-CM | POA: Insufficient documentation

## 2011-11-16 DIAGNOSIS — M25539 Pain in unspecified wrist: Secondary | ICD-10-CM | POA: Insufficient documentation

## 2011-11-16 DIAGNOSIS — R1011 Right upper quadrant pain: Secondary | ICD-10-CM | POA: Insufficient documentation

## 2011-11-16 DIAGNOSIS — R1013 Epigastric pain: Secondary | ICD-10-CM | POA: Insufficient documentation

## 2011-11-16 DIAGNOSIS — E039 Hypothyroidism, unspecified: Secondary | ICD-10-CM | POA: Insufficient documentation

## 2011-11-16 DIAGNOSIS — E785 Hyperlipidemia, unspecified: Secondary | ICD-10-CM | POA: Insufficient documentation

## 2011-11-16 DIAGNOSIS — R0789 Other chest pain: Secondary | ICD-10-CM | POA: Insufficient documentation

## 2011-11-16 DIAGNOSIS — I10 Essential (primary) hypertension: Secondary | ICD-10-CM | POA: Insufficient documentation

## 2011-11-16 LAB — CBC
HCT: 39 % (ref 36.0–46.0)
Hemoglobin: 13.1 g/dL (ref 12.0–15.0)
MCH: 27.5 pg (ref 26.0–34.0)
MCHC: 33.6 g/dL (ref 30.0–36.0)
MCV: 81.8 fL (ref 78.0–100.0)
RBC: 4.77 MIL/uL (ref 3.87–5.11)

## 2011-11-16 LAB — HEPATIC FUNCTION PANEL
Albumin: 3.8 g/dL (ref 3.5–5.2)
Alkaline Phosphatase: 63 U/L (ref 39–117)
Total Protein: 7.5 g/dL (ref 6.0–8.3)

## 2011-11-16 LAB — BASIC METABOLIC PANEL
CO2: 26 mEq/L (ref 19–32)
Calcium: 9.3 mg/dL (ref 8.4–10.5)
Chloride: 101 mEq/L (ref 96–112)
Glucose, Bld: 92 mg/dL (ref 70–99)
Potassium: 3.6 mEq/L (ref 3.5–5.1)
Sodium: 136 mEq/L (ref 135–145)

## 2011-11-16 LAB — CARDIAC PANEL(CRET KIN+CKTOT+MB+TROPI): Relative Index: 1.3 (ref 0.0–2.5)

## 2011-11-16 LAB — LIPASE, BLOOD: Lipase: 39 U/L (ref 11–59)

## 2011-11-16 MED ORDER — ONDANSETRON HCL 4 MG/2ML IJ SOLN
4.0000 mg | Freq: Once | INTRAMUSCULAR | Status: AC
  Administered 2011-11-16: 4 mg via INTRAVENOUS
  Filled 2011-11-16: qty 2

## 2011-11-16 MED ORDER — FENTANYL CITRATE 0.05 MG/ML IJ SOLN
50.0000 ug | Freq: Once | INTRAMUSCULAR | Status: AC
  Administered 2011-11-16: 50 ug via INTRAVENOUS
  Filled 2011-11-16: qty 2

## 2011-11-16 MED ORDER — MORPHINE SULFATE 4 MG/ML IJ SOLN
8.0000 mg | Freq: Once | INTRAMUSCULAR | Status: AC
  Administered 2011-11-16: 8 mg via INTRAVENOUS
  Filled 2011-11-16: qty 2

## 2011-11-16 MED ORDER — SODIUM CHLORIDE 0.9 % IV BOLUS (SEPSIS): 1000.0000 mL | Freq: Once | INTRAVENOUS | Status: DC

## 2011-11-16 MED ORDER — OXYCODONE-ACETAMINOPHEN 5-325 MG PO TABS: 1.0000 | ORAL_TABLET | Freq: Four times a day (QID) | ORAL | Status: AC | PRN

## 2011-11-16 MED ORDER — FENTANYL CITRATE 0.05 MG/ML IJ SOLN: 50.0000 ug | Freq: Once | INTRAMUSCULAR | Status: DC

## 2011-11-16 MED ORDER — SODIUM CHLORIDE 0.9 % IV BOLUS (SEPSIS)
1000.0000 mL | Freq: Once | INTRAVENOUS | Status: AC
  Administered 2011-11-16: 1000 mL via INTRAVENOUS

## 2011-11-16 MED ORDER — ONDANSETRON HCL 4 MG PO TABS: 4.0000 mg | ORAL_TABLET | Freq: Four times a day (QID) | ORAL | Status: AC

## 2011-11-16 MED ORDER — ASPIRIN 81 MG PO CHEW
324.0000 mg | CHEWABLE_TABLET | Freq: Once | ORAL | Status: AC
  Administered 2011-11-16: 324 mg via ORAL
  Filled 2011-11-16: qty 4

## 2011-11-16 MED ORDER — NITROGLYCERIN 0.4 MG SL SUBL
0.4000 mg | SUBLINGUAL_TABLET | SUBLINGUAL | Status: DC | PRN
  Administered 2011-11-16 (×2): 0.4 mg via SUBLINGUAL
  Filled 2011-11-16: qty 25

## 2011-11-16 MED ORDER — HYDROMORPHONE HCL PF 1 MG/ML IJ SOLN
1.0000 mg | Freq: Once | INTRAMUSCULAR | Status: AC
  Administered 2011-11-16: 1 mg via INTRAVENOUS
  Filled 2011-11-16: qty 1

## 2011-11-16 NOTE — ED Provider Notes (Signed)
History     CSN: [id number]  Arrival date & time 11/16/11  0630   First MD Initiated Contact with Patient 11/16/11 [phone]      Chief Complaint  Patient presents with  . Chest Pain    (Consider location/radiation/quality/duration/timing/severity/associated sxs/prior treatment) HPI  Patient presents to the ED with complaints of epigastric pain. She has been battling stomach and chest pains for a unclear period of time, she said a month first, then she said a year. This episode of pain started yesterday afternoon. She has seen a GI specialist and a cardiologist, both which have told her that they can not find anything wrong. She informs me that when she "comes in here like this, they do all these tests that you doin, and no one can find anything wrong". The patient denies shortness of breath, nausea, vomiting or diarrhea. Her pain is epigastric and RUQ. She states that it feels as though someone is kicking her in her back when her pain is strong. The quality of the pain is colicky.   Past Medical History  Diagnosis Date  . Hyperlipidemia   . HYPOTHYROIDISM 01/30/2007  . 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/2007  . ASTHMA 01/30/2007  . GERD 01/31/2007  . Hiatal hernia   . 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 Hemorrhoids   . HTN (hypertension) 09/09/2011    Past Surgical History  Procedure Date  . Abdominal hysterectomy   . Tubal ligation 1987  . Broken jaw   . Breast reduction surgery 1986  . Carpal tunnel release 2001  . Thyroidectomy     Family History  Problem Relation Age of Onset  . Hypertension Father   . Breast cancer Sister   . Colon cancer Neg Hx   . Diabetes Maternal Grandmother   . Heart disease Maternal Grandmother     History  Substance Use Topics  . Smoking status: Former Smoker    Quit date: 06/05/1979  . Smokeless tobacco: Not on file  . Alcohol Use: No    OB History    Grav Para Term Preterm Abortions TAB SAB Ect Mult Living                  Review of  Systems   HEENT: denies blurry vision or change in hearing PULMONARY: Denies difficulty breathing and SOB CARDIAC: denies chest pain or heart palpitations MUSCULOSKELETAL:  denies being unable to ambulate ABDOMEN AL: denies N/V GU: denies loss of bowel or urinary control NEURO: denies numbness and tingling in extremities SKIN: no new rashes PSYCH: patient behavior is normal NECK: No neck pain      Allergies  Codeine  Home Medications   Current Outpatient Rx  Name Route Sig Dispense Refill  . ALBUTEROL SULFATE HFA 108 (90 BASE) MCG/ACT IN AERS Inhalation Inhale 2 puffs into the lungs every 4 (four) hours as needed. For shortness of breath.    . CHLORPHENIRAMINE-ACETAMINOPHEN 2-325 MG PO TABS Oral Take 1 tablet by mouth as needed. For cold, cough, and flu symptoms.    [name] LEVOTHYROXINE SODIUM 125 MCG PO TABS Oral Take 1 tablet (125 mcg total) by mouth daily. 30 tablet 11  . LOSARTAN POTASSIUM 25 MG PO TABS Oral Take 1 tablet (25 mg total) by mouth daily. 30 tablet 3  . PANTOPRAZOLE SODIUM 40 MG PO TBEC Oral Take 1 tablet (40 mg total) by mouth daily. 90 tablet 3  . PROMETHAZINE HCL 25 MG PO TABS Oral Take 1 tablet (25 mg total) by mouth every  6 (six) hours as needed for nausea. 30 tablet 0  . HYDROCODONE-ACETAMINOPHEN 5-325 MG PO TABS Oral Take 1 tablet by mouth every 6 (six) hours as needed for pain. 60 tablet 0  . LEVOFLOXACIN 250 MG PO TABS Oral Take 1 tablet (250 mg total) by mouth daily. 10 tablet 0  . ONDANSETRON HCL 4 MG PO TABS Oral Take 1 tablet (4 mg total) by mouth every 6 (six) hours. 12 tablet 0  . OXYCODONE-ACETAMINOPHEN 5-325 MG PO TABS Oral Take 1 tablet by mouth every 6 (six) hours as needed for pain. 15 tablet 0    BP 137/79  Pulse 84  Temp 98.1 F (36.7 C) (Oral)  Resp 17  SpO2 100%  LMP 10/16/2011  Physical Exam  Nursing note and vitals reviewed. Constitutional: She appears well-developed and well-nourished. No distress.  HENT:  Head: Normocephalic and  atraumatic.  Eyes: Pupils are equal, round, and reactive to light.  Neck: Normal range of motion. Neck supple.  Cardiovascular: Normal rate and regular rhythm.   Pulmonary/Chest: Effort normal.  Abdominal: Soft. She exhibits no distension. There is tenderness (severe epigastric and RUQ pain). There is no rebound and no guarding.  Neurological: She is alert.  Skin: Skin is warm and dry.      ED Course  Procedures (including critical care time)  Labs Reviewed  CARDIAC PANEL(CRET KIN+CKTOT+MB+TROPI) - Abnormal; Notable for the following:    Total CK 183 (*)     All other components within normal limits  BASIC METABOLIC PANEL - Abnormal; Notable for the following:    GFR calc non Af Amer 84 (*)     All other components within normal limits  CBC  LIPASE, BLOOD  HEPATIC FUNCTION PANEL   Dg Chest 2 View  11/16/2011  *RADIOLOGY REPORT*  Clinical Data: Chest pain.  Short of breath.  Cough.  CHEST - 2 VIEW  Comparison: 04/30/2011 and previous  Findings: Artifact overlies chest.  Heart size is normal. Mediastinal shadows are normal.  The lungs are clear.  Vascularity is normal.  No effusions.  Ordinary mild degenerative changes effect the spine.  IMPRESSION: No active disease  Original Report Authenticated By: Thomasenia Sales, M.D.   Dg Wrist Complete Right  11/15/2011  *RADIOLOGY REPORT*  Clinical Data: Right wrist pain  RIGHT WRIST - COMPLETE 3+ VIEW  Comparison: None.  Findings: No acute fracture is seen.  The radiocarpal joint space is slightly narrowed.  On the AP view there does appear to be some widening of the scapholunate distance and a ligamentous injury cannot be excluded.  If necessary to evaluate further, MRI arthrography of the wrist would be recommended.  IMPRESSION: No fracture.  Somewhat widened scapholunate distance.  Question ligamentous injury.  Original Report Authenticated By: Juline Patch, M.D.   US Abdomen Complete  11/16/2011  *RADIOLOGY REPORT*  Clinical Data:  Right  upper quadrant and mid epigastric pain. History of hypertension and hysterectomy.  COMPLETE ABDOMINAL ULTRASOUND  Comparison:  Abdominal ultrasound 07/31/2007.  Abdominal pelvic CT 11/15/2004.  Findings:  Study is mildly limited by bowel gas and body habitus.  Gallbladder: Well distended without wall thickening, stones or pericholecystic fluid. Negative sonographic Murphy's sign.  Common bile duct:   Normal in caliber without filling defects.  Liver:  Mildly heterogeneous echogenicity without focal abnormality.  IVC:  Visualized portions appear unremarkable.  Pancreas:  Visualized portions appear unremarkable.  Spleen:  Visualized portions appear unremarkable.  Right Kidney:   The renal cortical thickness and  echogenicity are preserved.  There is no hydronephrosis or focal abnormality. Renal length is 10.2 cm.  Left Kidney:   The renal cortical thickness and echogenicity are preserved.  There is no hydronephrosis or focal abnormality. Renal length is 10.7 cm.  Abdominal aorta:  Visualized portions appear unremarkable.  IMPRESSION: No acute abdominal findings identified.  Original Report Authenticated By: Gerrianne Scale, M.D.   Dg Abd Acute W/chest  11/15/2011  *RADIOLOGY REPORT*  Clinical Data: Upper abdominal pain, chest pain, short of breath and nausea  ACUTE ABDOMEN SERIES (ABDOMEN 2 VIEW & CHEST 1 VIEW)  Comparison: Chest with acute abdomen of 10/28/2004  Findings: The lungs are clear.  Mediastinal contours appear normal. The heart is within normal limits in size.  Supine and erect views of the abdomen show no bowel obstruction. No free air is seen.  Multiple calcified phleboliths are present in the pelvis.  IMPRESSION:  1.  No active lung disease. 2.  No bowel obstruction.  No free air.  Original Report Authenticated By: Juline Patch, M.D.     1. Abdominal pain, acute, right upper quadrant       MDM     Date: 11/16/2011  Rate: 106  Rhythm: sinus tachycardia  QRS Axis: normal  Intervals:  normal  ST/T Wave abnormalities: borderline T sbnormalities, ant-lat leads  Conduction Disutrbances:none  Narrative Interpretation:   Old EKG Reviewed: unchanged from Oct 31- 2011   Patients pain did not resolve any after 2 nitro SL tabs- patients pain still a 10/10 8mg  of IV morphine and 4mg  IV Zofran given- patients pain still remains at a 7/10 I have ordered 50 mcg of IV Fentanyl, First Trop negative   Patients pain appears to be RUQ - an abdominal US was ordered and has returned negative. I am giving patient a referral to another GI doctor as the patient would like a second opinion. She has seen a cardiologist and a GI doctor for these pains and no one knows why she has the pains that she has.   I have discussed the plan with Dr. Brooke Dare who agrees that the patient can be discharged and follow-up with GI.  ,Pt has been advised of the symptoms that warrant their return to the ED. Patient has voiced understanding and has agreed to follow-up with the PCP or specialist.     Dorthula Matas, PA 11/16/11 1021

## 2011-11-16 NOTE — ED Notes (Signed)
[id number]<AV> Expected date:<BR> Expected time:<BR> Means of arrival:<BR> Comments:<BR> Hold for RES A

## 2011-11-16 NOTE — ED Notes (Signed)
Pt stating that she is still in a lot of pain. Pain 7/10

## 2011-11-16 NOTE — ED Notes (Signed)
Pt c/o chest pain; told yesterday by Dr Jonny Ruiz after chest xray she had possible pneumonia vs gallbladder.  Pt developed chest pain and right upper quad pain on Tues.  Denies nausea/vomiting; pain got worse this am around 6am.

## 2011-11-16 NOTE — Discharge Instructions (Signed)
Abdominal Pain (Nonspecific)  Your exam might not show the exact reason you have abdominal pain. Since there are many different causes of abdominal pain, another checkup and more tests may be needed. It is very important to follow up for lasting (persistent) or worsening symptoms. A possible cause of abdominal pain in any person who still has his or her appendix is acute appendicitis. Appendicitis is often hard to diagnose. Normal blood tests, urine tests, ultrasound, and CT scans do not completely rule out early appendicitis or other causes of abdominal pain. Sometimes, only the changes that happen over time will allow appendicitis and other causes of abdominal pain to be determined. Other potential problems that may require surgery may also take time to become more apparent. Because of this, it is important that you follow all of the instructions below.  HOME CARE INSTRUCTIONS    Rest as much as possible.   Do not eat solid food until your pain is gone.   While adults or children have pain: A diet of water, weak decaffeinated tea, broth or bouillon, gelatin, oral rehydration solutions (ORS), frozen ice pops, or ice chips may be helpful.   When pain is gone in adults or children: Start a light diet (dry toast, crackers, applesauce, or white rice). Increase the diet slowly as long as it does not bother you. Eat no dairy products (including cheese and eggs) and no spicy, fatty, fried, or high-fiber foods.   Use no alcohol, caffeine, or cigarettes.   Take your regular medicines unless your caregiver told you not to.   Take any prescribed medicine as directed.   Only take over-the-counter or prescription medicines for pain, discomfort, or fever as directed by your caregiver. Do not give aspirin to children.  If your caregiver has given you a follow-up appointment, it is very important to keep that appointment. Not keeping the appointment could result in a permanent injury and/or lasting (chronic) pain and/or  disability. If there is any problem keeping the appointment, you must call to reschedule.   SEEK IMMEDIATE MEDICAL CARE IF:    Your pain is not gone in 24 hours.   Your pain becomes worse, changes location, or feels different.   You or your child has an oral temperature above 102 F (38.9 C), not controlled by medicine.   Your baby is older than 3 months with a rectal temperature of 102 F (38.9 C) or higher.   Your baby is 3 months old or younger with a rectal temperature of 100.4 F (38 C) or higher.   You have shaking chills.   You keep throwing up (vomiting) or cannot drink liquids.   There is blood in your vomit or you see blood in your bowel movements.   Your bowel movements become dark or black.   You have frequent bowel movements.   Your bowel movements stop (become blocked) or you cannot pass gas.   You have bloody, frequent, or painful urination.   You have yellow discoloration in the skin or whites of the eyes.   Your stomach becomes bloated or bigger.   You have dizziness or fainting.   You have chest or back pain.  MAKE SURE YOU:    Understand these instructions.   Will watch your condition.   Will get help right away if you are not doing well or get worse.  Document Released: 05/21/2005 Document Revised: 05/10/2011 Document Reviewed: 04/18/2009  [employer] Patient Information 2012 [employer]

## 2011-11-16 NOTE — ED Notes (Signed)
Pt states that pain is now located in mid abdominal area radiating towards lower back. Pain comes and goes but is currently a 7/10.

## 2011-11-16 NOTE — ED Notes (Signed)
US at bedside

## 2011-11-19 ENCOUNTER — Telehealth: Payer: Self-pay | Admitting: Oncology

## 2011-11-19 NOTE — Telephone Encounter (Signed)
moved pts appt to 7/2 from 6/21  aom

## 2011-11-20 NOTE — ED Provider Notes (Signed)
Medical screening examination/treatment/procedure(s) were performed by non-physician practitioner and as supervising physician I was immediately available for consultation/collaboration.   Dayton Bailiff, MD 11/20/11 1115

## 2011-11-23 ENCOUNTER — Other Ambulatory Visit: Payer: BC Managed Care – PPO | Admitting: Lab

## 2011-11-23 ENCOUNTER — Ambulatory Visit: Payer: BC Managed Care – PPO | Admitting: Oncology

## 2011-12-04 ENCOUNTER — Telehealth: Payer: Self-pay | Admitting: Oncology

## 2011-12-04 ENCOUNTER — Ambulatory Visit (HOSPITAL_BASED_OUTPATIENT_CLINIC_OR_DEPARTMENT_OTHER): Payer: BC Managed Care – PPO | Admitting: Oncology

## 2011-12-04 ENCOUNTER — Other Ambulatory Visit (HOSPITAL_BASED_OUTPATIENT_CLINIC_OR_DEPARTMENT_OTHER): Payer: BC Managed Care – PPO | Admitting: Lab

## 2011-12-04 ENCOUNTER — Encounter: Payer: Self-pay | Admitting: Oncology

## 2011-12-04 VITALS — BP 148/96 | HR 83 | Temp 97.6°F | Ht 64.0 in | Wt 195.6 lb

## 2011-12-04 DIAGNOSIS — D509 Iron deficiency anemia, unspecified: Secondary | ICD-10-CM

## 2011-12-04 DIAGNOSIS — D649 Anemia, unspecified: Secondary | ICD-10-CM

## 2011-12-04 LAB — COMPREHENSIVE METABOLIC PANEL
ALT: 12 U/L (ref 0–35)
CO2: 27 mEq/L (ref 19–32)
Calcium: 8.8 mg/dL (ref 8.4–10.5)
Chloride: 104 mEq/L (ref 96–112)
Creatinine, Ser: 0.86 mg/dL (ref 0.50–1.10)
Glucose, Bld: 97 mg/dL (ref 70–99)
Total Bilirubin: 0.3 mg/dL (ref 0.3–1.2)

## 2011-12-04 LAB — CBC WITH DIFFERENTIAL/PLATELET
BASO%: 0.3 % (ref 0.0–2.0)
Basophils Absolute: 0 10*3/uL (ref 0.0–0.1)
Eosinophils Absolute: 0.1 10*3/uL (ref 0.0–0.5)
HCT: 36.7 % (ref 34.8–46.6)
HGB: 12.3 g/dL (ref 11.6–15.9)
LYMPH%: 42.3 % (ref 14.0–49.7)
MONO#: 0.3 10*3/uL (ref 0.1–0.9)
NEUT#: 2 10*3/uL (ref 1.5–6.5)
NEUT%: 48 % (ref 38.4–76.8)
Platelets: 233 10*3/uL (ref 145–400)
WBC: 4.2 10*3/uL (ref 3.9–10.3)
lymph#: 1.8 10*3/uL (ref 0.9–3.3)

## 2011-12-04 LAB — IRON AND TIBC
%SAT: 17 % — ABNORMAL LOW (ref 20–55)
Iron: 55 ug/dL (ref 42–145)
UIBC: 266 ug/dL (ref 125–400)

## 2011-12-04 LAB — FERRITIN: Ferritin: 390 ng/mL — ABNORMAL HIGH (ref 10–291)

## 2011-12-04 NOTE — Progress Notes (Signed)
CC:   Corwin Levins, MD Miguel Aschoff, M.D. Venita Lick. Russella Dar, MD, [facility]  PROBLEM LIST: 1. History of iron-deficiency anemia extending back to at least to May     of 2005 felt to be due to excessive menstrual losses.  The patient     has received intravenous iron on several occasions throughout the     years.  She has had stools that were negative for occult blood x3     on 08/10/2009 and 12/19/2010.  She has undergone endoscopy and     colonoscopy by Dr. Claudette Head on 12/25/2010.  The patient has     received Feraheme 510 mg IV on 11/10/2010, 11/17/2010, 08/01/2011     and 08/08/2011. 2. History of asthma. 3. Hypertension. 4. Gastroesophageal reflux disease. 5. Irritable bowel syndrome. 6. Hypothyroidism, status post thyroidectomy in 1979.  MEDICATIONS: 1. Albuterol inhaler 2 puffs every 4 hours as needed for shortness of     breath. 2. Coricidin 1 tablet as needed for upper respiratory infection. 3. Levothyroxine 125 mcg daily. 4. Cozaar 25 mg daily. 5. Protonix 40 mg daily. 6. Cyanocobalamin 1000 mcg daily.  SMOKING HISTORY:  Marilyn [name] had smoked for a couple of years many years ago but has not smoked in approximately 30 years.  HISTORY:  I saw Marilyn [name] today for followup of her iron deficiency anemia felt to be due to excessive menstrual losses.  Marilyn [name] was last seen by [place] on 07/26/2011 and prior to that on 03/09/2011.  On her last visit here, she was found to have a ferritin that had dropped down to 73 from 287 on 03/09/2011.  Marilyn [name] received IV Feraheme 510 mg on 08/01/2011 and 08/08/2011.  She tolerated these infusions well.  Marilyn [name] tells me that she has heavy menses without clots most recently April but also in January, February and March.  She denies any pagophagia at the present time although she has had pagophagia in the past.  She does have a little bleeding from hemorrhoids.  Periodically she has black stools.  As stated above,  Marilyn [name] did undergo  endoscopy and colonoscopy by Dr. Claudette Head on 12/25/2010.  I believe findings were negative.  Stools have been negative for occult blood in the past.  Marilyn [name]'s main concern is episodic chest discomfort and shortness of breath.  She went to the emergency room just a few weeks ago.  I spoke with her about the possibility of pulmonary emboli.  In looking through her records, I see where she had an attempt at a chest CT angiogram on 04/03/2010.  However, apparently this was a suboptimal study and the possibility of pulmonary emboli could not be adequately addressed or ruled out.  I suggested to Marilyn [name] that if she has any further episodes that it may be worthwhile to obtain a better study, specifically a chest CT angiogram to rule out the possibly of pulmonary emboli.  Of note is the fact that Marilyn [name] does have asthma.  She does not think she is having panic attacks.  She will have episodes of shortness of breath that come on fairly suddenly and not associated with anxiety.  PHYSICAL EXAM:  There is little change.  Weight is 195.6 pounds, height 5 feet 4 inches, body surface area 2.0 meters squared.  Blood pressure 148/96.  Other vital signs are normal.  O2 saturation on room air at rest was 98%.  There is no scleral icterus.  Mouth and pharynx are benign.  There is no peripheral adenopathy palpable.  There is a scar over the right mandible where she apparently needed surgery to repair a fractured mandible.  She also has a well-healed thyroidectomy scar. Lungs:  Clear to percussion and auscultation.  Cardiac:  Without murmur or rub.  Abdomen:  Obese, nontender with no organomegaly or masses palpable.  Extremities:  No peripheral edema or clubbing.  Neurologic: Exam was normal.  LABORATORY DATA:  Today, white count 4.2, ANC 2.0, hemoglobin 12.3, hematocrit 36.7, platelets 233,000.  Chemistries and iron studies today are pending.  Chemistries from 07/26/2011 were normal except for  a glucose of 128.  Chemistries on 11/15/2011 were also normal.  On 07/26/2011, iron saturation was 9%, ferritin 73 as compared with 14% and 287 respectively on 03/09/2011.  On 12/15/2010, ferritin was 801 following intravenous iron.  On 11/07/2010, ferritin was 47.  IMAGING STUDIES: 1. CT angiogram of the chest with IV contrast on 04/03/2010 showed no     evidence for an aortic dissection or pulmonary embolism.  There was     mild ectasia of the ascending thoracic aorta.  There was trace     pericardial fluid and some scattered minimal to mild atelectatic     changes.  It was noted that the present study was not optimized to     evaluate for pulmonary embolism and therefore this diagnostic     possibility could not be addressed. 2. CT scan of the neck without IV contrast on 07/25/2011 showed no     significant soft tissue abnormalities.  There were mild     degenerative changes along the lower cervical spine. 3. Chest x-ray, 2 view, from 11/16/2011 showed no active disease. 4. Complete abdominal ultrasound from 11/16/2011 showed no acute     abdominal findings.  IMPRESSION AND PLAN:  Marilyn [name]'s hemoglobin and hematocrit are quite good today, 12.3 and 36.7, respectively.  We are waiting today's iron studies.  If they have dropped, we will go ahead with additional IV iron which we will arrange over the next few weeks.  Marilyn [name] and I spoke about her episodes of chest pain and shortness of breath.  Certainly she may be having some panic attacks but the possibility of pulmonary emboli probably needs to be excluded definitively.  We will plan to see Marilyn [name] again in about 4 months at which time we will check CBC, chemistries, and iron studies.  I am giving her stool cards today that check for occult blood once again.   Addendum:  Ferritin was 390 and iron saturation was 17%.   ______________________________ Samul Dada, M.D. DSM/MEDQ  D:  12/04/2011  T:  12/04/2011  Job:   [id number]

## 2011-12-04 NOTE — Progress Notes (Signed)
This office note has been dictated.  #[id number]

## 2011-12-04 NOTE — Telephone Encounter (Signed)
appts made and printed   aom 

## 2011-12-12 ENCOUNTER — Other Ambulatory Visit: Payer: Self-pay | Admitting: Internal Medicine

## 2011-12-17 ENCOUNTER — Encounter: Payer: Self-pay | Admitting: Nurse Practitioner

## 2011-12-17 ENCOUNTER — Other Ambulatory Visit (HOSPITAL_COMMUNITY): Payer: BC Managed Care – PPO

## 2011-12-17 ENCOUNTER — Ambulatory Visit (HOSPITAL_COMMUNITY)
Admission: RE | Admit: 2011-12-17 | Discharge: 2011-12-17 | Disposition: A | Discharge status: Home / Self Care | Payer: BC Managed Care – PPO | Source: Ambulatory Visit | Attending: Oncology | Admitting: Oncology

## 2011-12-17 ENCOUNTER — Telehealth: Payer: Self-pay

## 2011-12-17 ENCOUNTER — Telehealth: Payer: Self-pay | Admitting: Oncology

## 2011-12-17 ENCOUNTER — Other Ambulatory Visit: Payer: Self-pay | Admitting: Nurse Practitioner

## 2011-12-17 DIAGNOSIS — R0602 Shortness of breath: Secondary | ICD-10-CM

## 2011-12-17 MED ORDER — IOHEXOL 350 MG/ML SOLN
100.0000 mL | Freq: Once | INTRAVENOUS | Status: AC | PRN
  Administered 2011-12-17: 90 mL via INTRAVENOUS

## 2011-12-17 NOTE — Progress Notes (Signed)
Received notice from Dr. Raphael Gibney office- pt continues to have shortness of breath.  CT angio of chest ordered per Dr. Arline Asp.  Notified patient that scheduler will be contacting her.

## 2011-12-17 NOTE — Telephone Encounter (Signed)
I would not order this myself, but I will forward to Dr Arline Asp

## 2011-12-17 NOTE — Telephone Encounter (Signed)
Patient informed. 

## 2011-12-17 NOTE — Telephone Encounter (Signed)
pt called with ct appt for today and ins takien to linda   aom

## 2011-12-17 NOTE — Telephone Encounter (Signed)
Patient called to inform she is still having some shortness of breath, may be getting some congestion.  She stated she saw Dr. Arline Asp on 12/04/11 and was suggested in his notes she may need a Chest CT Angiogram for these symptoms.  She would like to follow through with this if PCP would agree.  Please advise call back number is [phone]

## 2012-01-10 ENCOUNTER — Telehealth: Payer: Self-pay

## 2012-01-10 NOTE — Telephone Encounter (Signed)
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 Dr Oliver Barre declined to give her a note. She states she is trying to prevent getting respiratory infection she is prevalent toward. Note forwarded to DSM.

## 2012-01-10 NOTE — Telephone Encounter (Signed)
Chart reveiwed, but very sorry, I dont think a note for this restriction at work is necessary, as being outdoors should not cause any worsening of her medical conditions

## 2012-01-10 NOTE — Telephone Encounter (Signed)
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  She states her principal requested this to have her excused from this duty if there is a medical reason. Please advise

## 2012-01-10 NOTE — Telephone Encounter (Signed)
Called the patient left message to call back 

## 2012-01-10 NOTE — Telephone Encounter (Signed)
Patient informed of MD's response to request 

## 2012-01-11 ENCOUNTER — Telehealth: Payer: Self-pay | Admitting: Medical Oncology

## 2012-01-11 NOTE — Telephone Encounter (Signed)
Pt had requested a note that she limit her time outside due to sinus infections and bronchial infections. Per Dr. Arline Asp he treats her anemia and does not feel he he can write this note. He is willing to refer her to an ENT. Pt states she did see and ENT (could not recall name) he told her she needs to get her tonsils out. She states she does not have the money. I told her she should call this office since they have evaluated her to see if they will write her a note. She voiced understanding.

## 2012-03-04 LAB — HM MAMMOGRAPHY

## 2012-04-07 ENCOUNTER — Other Ambulatory Visit (HOSPITAL_BASED_OUTPATIENT_CLINIC_OR_DEPARTMENT_OTHER): Payer: BC Managed Care – PPO | Admitting: Lab

## 2012-04-07 ENCOUNTER — Ambulatory Visit (HOSPITAL_BASED_OUTPATIENT_CLINIC_OR_DEPARTMENT_OTHER): Payer: BC Managed Care – PPO | Admitting: Family

## 2012-04-07 ENCOUNTER — Encounter: Payer: Self-pay | Admitting: Family

## 2012-04-07 ENCOUNTER — Telehealth: Payer: Self-pay | Admitting: Oncology

## 2012-04-07 VITALS — BP 168/112 | HR 96 | Temp 97.4°F | Resp 20 | Ht 64.0 in | Wt 197.0 lb

## 2012-04-07 DIAGNOSIS — N920 Excessive and frequent menstruation with regular cycle: Secondary | ICD-10-CM

## 2012-04-07 DIAGNOSIS — D509 Iron deficiency anemia, unspecified: Secondary | ICD-10-CM

## 2012-04-07 DIAGNOSIS — I319 Disease of pericardium, unspecified: Secondary | ICD-10-CM

## 2012-04-07 LAB — COMPREHENSIVE METABOLIC PANEL (CC13)
Albumin: 3.9 g/dL (ref 3.5–5.0)
BUN: 13 mg/dL (ref 7.0–26.0)
CO2: 25 mEq/L (ref 22–29)
Calcium: 8.8 mg/dL (ref 8.4–10.4)
Chloride: 107 mEq/L (ref 98–107)
Glucose: 93 mg/dl (ref 70–99)
Potassium: 3.9 mEq/L (ref 3.5–5.1)
Sodium: 139 mEq/L (ref 136–145)
Total Protein: 7.8 g/dL (ref 6.4–8.3)

## 2012-04-07 LAB — CBC WITH DIFFERENTIAL/PLATELET
BASO%: 0.3 % (ref 0.0–2.0)
Basophils Absolute: 0 10*3/uL (ref 0.0–0.1)
EOS%: 1.8 % (ref 0.0–7.0)
HCT: 35.3 % (ref 34.8–46.6)
HGB: 11.9 g/dL (ref 11.6–15.9)
MCH: 28 pg (ref 25.1–34.0)
MCHC: 33.6 g/dL (ref 31.5–36.0)
MCV: 83.2 fL (ref 79.5–101.0)
MONO%: 7.3 % (ref 0.0–14.0)
NEUT%: 45.6 % (ref 38.4–76.8)

## 2012-04-07 LAB — LACTATE DEHYDROGENASE (CC13): LDH: 229 U/L — ABNORMAL HIGH (ref 125–220)

## 2012-04-07 NOTE — Progress Notes (Signed)
Patient ID: Marilyn [name], female   DOB: 1966/01/13, 46 y.o.   MRN: [id number] CSN: [id number]  CC: Marilyn Levins, MD  Marilyn [name], M.D.  Marilyn Lick. Russella Dar, MD, Marilyn [name]  Problem List: HETHER [name] is a 53 y.o. Caucasian female with a problem list consisting of:  1. History of iron-deficiency anemia extending back to at least to May of 2005 felt to be due to excessive menstrual losses. The patient has received intravenous iron on several occasions throughout the years. She has had stools that were negative for occult blood x 3 on 08/10/2009 and 12/19/2010. She has undergone endoscopy and colonoscopy by Dr. Claudette [name] on 12/25/2010. The patient has received Feraheme 510 mg IV on 11/10/2010, 11/17/2010, 08/01/2011 and 08/08/2011.  2. History of asthma  3. Hypertension 4. Gastroesophageal reflux disease  5. Irritable bowel syndrome 6. Hypothyroidism, status post thyroidectomy in 1979.  Dr. Arline [name] and I saw Marilyn [name] today for follow up of her iron deficiency anemia felt to be due to excessive menstrual losses. Marilyn [name] was last seen by [place] on 12/04/2011. On her last visit here, she did not require a Feraheme infusion as her Ferritin level was 390. She did received IV Feraheme 510 mg on 08/01/2011 and 08/08/2011. She tolerated those infusions well.  Marilyn [name] stated that her menses have been irregular.  She did not have a menses from July 2013 - September 2013, but then had an unusually lengthy menses lasting 10-12 days in October 2013.  She denies any pagophagia at the present time. She states that her stools have been irregular in color ranging from white, to tarry black to bright green.  She denies any blood in her stools or hematuria.  Marilyn [name] has not turned in her stool cards, but states she still has them and will turn them in.  Stools have been negative for occult blood in the past.  The patient has numerous physical complaints today including a headache, hearing changes,  pain behind her left breast (for 1 month), nausea and constipation.  Marilyn [name] admits to not being compliant with her medications or preventive care (i.e. GYN exams, mammograms).   Her blood pressure is elevated today at 168/112 and 192/111.  She states she had not taken her blood pressure medication today.  The patient took her blood pressure medication in our office today. A chest CT angiogram was completed on 12/17/2011 and did not show any acute disease in the chest or pulmonary emboli.  The patient denies any other symptomatology including fever, chills, pagophagia, unusual bleeding, vomiting, diarrhea or constipation during her office visit today.   Past Medical History: Past Medical History  Diagnosis Date  . Hyperlipidemia   . HYPOTHYROIDISM 01/30/2007  . 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/2007  . ASTHMA 01/30/2007  . GERD 01/31/2007  . Hiatal hernia   . 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 Hemorrhoids   . HTN (hypertension) 09/09/2011    Surgical History: Past Surgical History  Procedure Date  . Abdominal hysterectomy   . Tubal ligation 1987  . Broken jaw   . Breast reduction surgery 1986  . Carpal tunnel release 2001  . Thyroidectomy     Current Medications: Current Outpatient Prescriptions  Medication Sig Dispense Refill  . albuterol (VENTOLIN HFA) 108 (90 BASE) MCG/ACT inhaler Inhale 2 puffs into the lungs every 4 (four) hours as needed. For shortness of breath.      . Chlorpheniramine-APAP (CORICIDIN) 2-325 MG TABS Take 1 tablet by mouth  as needed. For cold, cough, and flu symptoms.      [name] levothyroxine (SYNTHROID, LEVOTHROID) 137 MCG tablet TAKE ONE TABLET BY MOUTH EVERY DAY  90 tablet  3  . losartan (COZAAR) 25 MG tablet Take 1 tablet (25 mg total) by mouth daily.  30 tablet  3  . pantoprazole (PROTONIX) 40 MG tablet Take 1 tablet (40 mg total) by mouth daily.  90 tablet  3  . vitamin B-12 (CYANOCOBALAMIN) 1000 MCG tablet Take 1,000 mcg by mouth daily.         Allergies: Allergies  Allergen Reactions  . Codeine Other (See Comments)    Makes really sick    Family History: Family History  Problem Relation Age of Onset  . Hypertension Father   . Diabetes Father   . Breast cancer Sister   . Colon cancer Neg Hx   . Diabetes Maternal Grandmother   . Heart disease Maternal Grandmother   . Asthma Mother     Social History: History  Substance Use Topics  . Smoking status: Former Smoker    Quit date: 06/05/1979  . Smokeless tobacco: Former Neurosurgeon    Types: Snuff  . Alcohol Use: No    Review of Systems: 10 Point review of systems was completed and is negative except as noted above.   Physical Exam:   Blood pressure 168/112, pulse 96, temperature 97.4 F (36.3 C), temperature source Oral, resp. rate 20, height 5\' 4"  (1.626 m), weight 197 lb (89.359 kg).  General appearance: Alert, cooperative, well nourished, mild distress [name]: Normocephalic, without obvious abnormality, atraumatic Eyes: Conjunctivae/corneas clear, PERRLA, EOMI Nose: Nares, septum and mucosa are normal, no drainage or sinus tenderness Neck: No adenopathy, supple, symmetrical, trachea midline, thyroid not enlarged, no tenderness Resp: Clear to auscultation bilaterally, diminished bibasilar breath sounds Cardio: Regular rate and rhythm, S1, S2 normal, no murmur, click, rub or gallop GI: Soft, distended, non-tender, hypoactive bowel sounds, no organomegaly Extremities: Extremities normal, atraumatic, no cyanosis or edema Lymph nodes: Cervical, supraclavicular, and axillary nodes normal Neurologic: Grossly normal   Laboratory Data: Results for orders placed in visit on 04/07/12 (from the past 48 hour(s))  CBC WITH DIFFERENTIAL     Status: Normal   Collection Time   04/07/12  3:05 PM      Component Value Range Comment   WBC 5.2  3.9 - 10.3 10e3/uL    NEUT# 2.4  1.5 - 6.5 10e3/uL    HGB 11.9  11.6 - 15.9 g/dL    HCT 16.1  09.6 - 04.5 %    Platelets 223  145 -  400 10e3/uL    MCV 83.2  79.5 - 101.0 fL    MCH 28.0  25.1 - 34.0 pg    MCHC 33.6  31.5 - 36.0 g/dL    RBC 4.09  8.11 - 9.14 10e6/uL    RDW 13.3  11.2 - 14.5 %    lymph# 2.4  0.9 - 3.3 10e3/uL    MONO# 0.4  0.1 - 0.9 10e3/uL    Eosinophils Absolute 0.1  0.0 - 0.5 10e3/uL    Basophils Absolute 0.0  0.0 - 0.1 10e3/uL    NEUT% 45.6  38.4 - 76.8 %    LYMPH% 45.0  14.0 - 49.7 %    MONO% 7.3  0.0 - 14.0 %    EOS% 1.8  0.0 - 7.0 %    BASO% 0.3  0.0 - 2.0 %   COMPREHENSIVE METABOLIC PANEL (CC13)  Status: Normal   Collection Time   04/07/12  3:05 PM      Component Value Range Comment   Sodium 139  136 - 145 mEq/L    Potassium 3.9  3.5 - 5.1 mEq/L    Chloride 107  98 - 107 mEq/L    CO2 25  22 - 29 mEq/L    Glucose 93  70 - 99 mg/dl    BUN 16.1  7.0 - 09.6 mg/dL    Creatinine 0.8  0.6 - 1.1 mg/dL    Total Bilirubin 0.45  0.20 - 1.20 mg/dL    Alkaline Phosphatase 69  40 - 150 U/L    AST 19  5 - 34 U/L    ALT 15  0 - 55 U/L    Total Protein 7.8  6.4 - 8.3 g/dL    Albumin 3.9  3.5 - 5.0 g/dL    Calcium 8.8  8.4 - 40.9 mg/dL   LACTATE DEHYDROGENASE (CC13)     Status: Abnormal   Collection Time   04/07/12  3:05 PM      Component Value Range Comment   LDH 229 (*) 125 - 220 U/L      Imaging Studies: 1. CT angiogram of the chest with IV contrast on 04/03/2010 showed no evidence for an aortic dissection or pulmonary embolism. There was mild ectasia of the ascending thoracic aorta. There was trace pericardial fluid and some scattered minimal to mild atelectatic changes. It was noted that the present study was not optimized to evaluate for pulmonary embolism and therefore this diagnostic possibility could not be addressed.  2. CT scan of the neck without IV contrast on 07/25/2011 showed no significant soft tissue abnormalities. There were mild degenerative changes along the lower cervical spine.  3. Chest x-ray, 2 view, from 11/16/2011 showed no active disease.  4. Complete abdominal  ultrasound from 11/16/2011 showed no acute abdominal findings. 5. CTA of the Chest on 12/17/2011 showed no acute disease in the chest. There are no pulmonary emboli, infiltrates, effusions, or other significant abnormalities. There is a tiny intrapulmonary node along the right minor fissure, unchanged. There is slight chronic thickening of the pericardium, unchanged. Heart size is normal. No osseous abnormality.   Impression/Plan: Marilyn [name] hemoglobin and hematocrit are adequate today, at 11.9 and 35.3, respectively. We are awaiting the results of today's iron studies.  If they have dropped, we will go ahead with an IV iron infusion to be arranged over the next few weeks.  We will plan to see Marilyn [name] again in about 4 months at which time we will check CBC, chemistries, and iron studies. We have requested that she turn in her stool cards soon.  We have also requested that she discuss her elevated blood pressure readings, hearing changes, stool changes, breast pain, nausea and constipation with her PCP.  She has been encouraged to take her medications daily,to schedule a mammogram and GYN visit soon.  Marilyn [name] will contact us in the interim if she has any questions or concerns.    Larina Bras, NP-C 04/07/2012, 3:17 PM

## 2012-04-07 NOTE — Telephone Encounter (Signed)
gv and printed March 2014 schedule for pt.

## 2012-04-07 NOTE — Patient Instructions (Addendum)
Please take your medications as prescribed and remember to take your blood pressure medication daily. Please contact your primary care physician today and notify him about your elevated blood pressure readings (168/112 and 192/111) and about your other concerns. Please return your stool cards to [place] as soon as possible. Please call today to schedule your mammogram, GYN exam and to have your hearing checked.

## 2012-04-09 ENCOUNTER — Telehealth: Payer: Self-pay

## 2012-04-09 ENCOUNTER — Telehealth: Payer: Self-pay | Admitting: Medical Oncology

## 2012-04-09 ENCOUNTER — Other Ambulatory Visit: Payer: Self-pay

## 2012-04-09 MED ORDER — LOSARTAN POTASSIUM 25 MG PO TABS: 25.0000 mg | ORAL_TABLET | Freq: Every day | ORAL | Status: DC

## 2012-04-09 MED ORDER — LOSARTAN POTASSIUM 100 MG PO TABS: 100.0000 mg | ORAL_TABLET | Freq: Every day | ORAL | Status: DC

## 2012-04-09 NOTE — Telephone Encounter (Signed)
Ok to increase the losartan to 100 mg daily  Needs ROV 7-10 days

## 2012-04-09 NOTE — Telephone Encounter (Signed)
Pt called stating that she has tried to call Dr. Raphael Gibney office to get an appointment regarding her BP medication. I told her I will call and leave the nurse a message and hopefully they will get her in to see Dr. Jonny Ruiz. I called and left Robin-Dr. John's nurse a message and I also notified pt.

## 2012-04-09 NOTE — Telephone Encounter (Signed)
Received phone call from RN with Dr. Arline Asp.  The patient was seen at his office yesterday 04/08/12 and BP was 168/112.  Dr. Arline Asp informed the patient to followup with her PCP on BP.  Please advise as they have spoken to the patient today to remind to schedule appt. But unsure if she has or not.

## 2012-04-10 NOTE — Telephone Encounter (Signed)
Called the patient left message to call back 

## 2012-04-10 NOTE — Telephone Encounter (Signed)
Called informed the patient of medication increase.  The patient will call back to schedule appt. At her convenience.

## 2012-05-14 ENCOUNTER — Telehealth: Payer: Self-pay | Admitting: Internal Medicine

## 2012-05-14 NOTE — Telephone Encounter (Signed)
Patient Information:  Caller Name: Nikira  Phone: [phone]  Patient: Marilyn [name], Marilyn [name]  Gender: Female  DOB: 1965-10-16  Age: 46 Years  PCP: Oliver Barre (Adults only)  Pregnant: No  Office Follow Up:  Does the office need to follow up with this patient?: No  Instructions For The Office: N/A  RN Note:  Patient reports that she does not have any transportation to the office today. She will be able to come in tomorrow. Called in Rx for Tamiflu per standing orders: Tamilflu 75mg  po BID x 5 days, no refills. Unable to open Epic to verify refills for Albuterol inhaler. Appointment scheduled via office staff.  Symptoms  Reason For Call & Symptoms: Reports tightness in her chest, sore throat, productive cough with green mucus. Reports having chills at this time. Reports pain in left side of neck when tilting chin to chest.  Reviewed Health History In EMR: Yes  Reviewed Medications In EMR: Yes  Reviewed Allergies In EMR: Yes  Reviewed Surgeries / Procedures: Yes  Date of Onset of Symptoms: 05/13/2012  Treatments Tried: Drinking extra fluids, Ibuprofen, Vitamin C  Treatments Tried Worked: No  Any Fever: Yes  Fever Taken: Tactile  Fever Time Of Reading: 14:09:54  Fever Last Reading: N/A OB:  LMP: 03/03/2012  Guideline(s) Used:  No Protocol Available - Sick Adult  Disposition Per Guideline:   Go to ED Now (or to Office with PCP Approval)  Reason For Disposition Reached:   Nursing judgment  Advice Given:    Appointment Scheduled:  05/15/2012 13:45:00 Appointment Scheduled Provider:  Nicki Reaper

## 2012-05-15 ENCOUNTER — Ambulatory Visit (INDEPENDENT_AMBULATORY_CARE_PROVIDER_SITE_OTHER): Payer: BC Managed Care – PPO | Admitting: Internal Medicine

## 2012-05-15 ENCOUNTER — Telehealth: Payer: Self-pay

## 2012-05-15 ENCOUNTER — Ambulatory Visit (INDEPENDENT_AMBULATORY_CARE_PROVIDER_SITE_OTHER)
Admission: RE | Admit: 2012-05-15 | Discharge: 2012-05-15 | Disposition: A | Discharge status: Home / Self Care | Payer: BC Managed Care – PPO | Source: Ambulatory Visit | Attending: Internal Medicine | Admitting: Internal Medicine

## 2012-05-15 ENCOUNTER — Encounter: Payer: Self-pay | Admitting: Internal Medicine

## 2012-05-15 VITALS — BP 122/88 | HR 99 | Temp 98.1°F | Ht 64.0 in | Wt 196.0 lb

## 2012-05-15 DIAGNOSIS — J40 Bronchitis, not specified as acute or chronic: Secondary | ICD-10-CM

## 2012-05-15 MED ORDER — HYDROCODONE-HOMATROPINE 5-1.5 MG/5ML PO SYRP: 5.0000 mL | ORAL_SOLUTION | Freq: Three times a day (TID) | ORAL | Status: DC | PRN

## 2012-05-15 MED ORDER — AMLODIPINE BESYLATE 5 MG PO TABS: 5.0000 mg | ORAL_TABLET | Freq: Every day | ORAL | Status: DC

## 2012-05-15 MED ORDER — FLUTICASONE PROPIONATE 50 MCG/ACT NA SUSP: 2.0000 | Freq: Every day | NASAL | Status: DC

## 2012-05-15 MED ORDER — LEVOFLOXACIN 500 MG PO TABS: 500.0000 mg | ORAL_TABLET | Freq: Every day | ORAL | Status: DC

## 2012-05-15 NOTE — Progress Notes (Signed)
Subjective:    Patient ID: Marilyn [name], female    DOB: 09/04/1965, 46 y.o.   MRN: [id number]  HPI  Pt presents to the clinic with c/o flu symptoms. She has had cough with thick green sputum production, chills, body aches and fever for 2 days. She does have a history of asthma. She has had sick contacts.  Review of Systems  Past Medical History  Diagnosis Date  . Hyperlipidemia   . HYPOTHYROIDISM 01/30/2007  . 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/2007  . ASTHMA 01/30/2007  . GERD 01/31/2007  . Hiatal hernia   . 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 Hemorrhoids   . HTN (hypertension) 09/09/2011    Current Outpatient Prescriptions  Medication Sig Dispense Refill  . albuterol (VENTOLIN HFA) 108 (90 BASE) MCG/ACT inhaler Inhale 2 puffs into the lungs every 4 (four) hours as needed. For shortness of breath.      . levothyroxine (SYNTHROID, LEVOTHROID) 137 MCG tablet TAKE ONE TABLET BY MOUTH EVERY DAY  90 tablet  3  . losartan (COZAAR) 100 MG tablet Take 1 tablet (100 mg total) by mouth daily.  90 tablet  3  . pantoprazole (PROTONIX) 40 MG tablet Take 1 tablet (40 mg total) by mouth daily.  90 tablet  3  . vitamin B-12 (CYANOCOBALAMIN) 1000 MCG tablet Take 1,000 mcg by mouth daily.      . Chlorpheniramine-APAP (CORICIDIN) 2-325 MG TABS Take 1 tablet by mouth as needed. For cold, cough, and flu symptoms.        Allergies  Allergen Reactions  . Codeine Other (See Comments)    Makes really sick    Family History  Problem Relation Age of Onset  . Hypertension Father   . Diabetes Father   . Breast cancer Sister   . Colon cancer Neg Hx   . Diabetes Maternal Grandmother   . Heart disease Maternal Grandmother   . Asthma Mother     History   Social History  . Marital Status: Single    Spouse Name: N/A    Number of Children: 3  . Years of Education: N/A   Occupational History  . [employer]ACH[employer]    Social History Main Topics  . Smoking status: Former Smoker    Quit date: 06/05/1979  .  Smokeless tobacco: Former Neurosurgeon    Types: Snuff  . Alcohol Use: No  . Drug Use: No  . Sexually Active: Not on file   Other Topics Concern  . Not on file   Social History Narrative   Married     Constitutional: Pt reports fever and fatigued. Denies malaise, headache or abrupt weight changes.  HEENT: Denies eye pain, eye redness, ear pain, ringing in the ears, wax buildup, runny nose, nasal congestion, bloody nose, or sore throat. Respiratory: Pt reports shortness of breath with cough and sputum production. Denies difficulty breathing.   Cardiovascular: Denies chest pain, chest tightness, palpitations or swelling in the hands or feet.  Gastrointestinal: Denies abdominal pain, nausea, vomiting, bloating, constipation, diarrhea or blood in the stool.  Neurological: Denies dizziness, difficulty with memory, difficulty with speech or problems with balance and coordination.   No other specific complaints in a complete review of systems (except as listed in HPI above).     Objective:   Physical Exam   BP 122/88  Pulse 99  Temp 98.1 F (36.7 C) (Oral)  Ht 5\' 4"  (1.626 m)  Wt 196 lb (88.905 kg)  BMI 33.64 kg/m2  SpO2 99% Wt  Readings from Last 3 Encounters:  05/15/12 196 lb (88.905 kg)  04/07/12 197 lb (89.359 kg)  12/04/11 195 lb 9.6 oz (88.724 kg)    General: Appears her stated age, obese but well developed, well nourished in NAD. Skin: Warm, dry and intact. No rashes, lesions or ulcerations noted. HEENT: Head: normal shape and size; Eyes: sclera white, no icterus, conjunctiva pink, PERRLA and EOMs intact; Ears: Tm's gray and intact, normal light reflex; Nose: mucosa pink and moist, septum midline; Throat/Mouth: Teeth present, mucosa pink and moist, no exudate, lesions or ulcerations noted.  Neck: Normal range of motion. Neck supple, trachea midline. Mild cervical lymphadenopathy. No massses, lumps or thyromegaly present.  Cardiovascular: Normal rate and rhythm. S1,S2 noted.  No  murmur, rubs or gallops noted. No JVD or BLE edema. No carotid bruits noted. Pulmonary/Chest: Normal effort and coarse rhonchi in bilateral bases. No respiratory distress.  Neurological: Alert and oriented. Cranial nerves II-XII intact. Coordination normal. +DTRs bilaterally.       Assessment & Plan:   Bronchitis, acute onset with additional workup required:  Chest xray to r/o pneumonia Levaquin x 7 days Hycodan cough syrup  RTC as needed or if symptoms persist

## 2012-05-15 NOTE — Telephone Encounter (Signed)
The patient has stopped the Losartan 100 mg.  Since the increase her hair has been falling out. Please advise as the patient thinks the medication increase is the cause and has stopped, but needs to know what to do about her BP.

## 2012-05-15 NOTE — Patient Instructions (Addendum)

## 2012-05-15 NOTE — Telephone Encounter (Signed)
Ok to change to amlodipine 5 qd

## 2012-05-15 NOTE — Telephone Encounter (Signed)
Informed the patient of MD's response.  The patient does not want to continue on losartan,  Please advise on alternative.

## 2012-05-15 NOTE — Telephone Encounter (Signed)
Losartan should be ok to continue for now, as this is very unlikely to have caused this

## 2012-05-16 NOTE — Telephone Encounter (Signed)
Patient informed. 

## 2012-07-28 ENCOUNTER — Emergency Department (INDEPENDENT_AMBULATORY_CARE_PROVIDER_SITE_OTHER): Payer: BC Managed Care – PPO

## 2012-07-28 ENCOUNTER — Encounter (HOSPITAL_COMMUNITY): Payer: Self-pay | Admitting: Emergency Medicine

## 2012-07-28 ENCOUNTER — Emergency Department (HOSPITAL_COMMUNITY)
Admission: EM | Admit: 2012-07-28 | Discharge: 2012-07-28 | Disposition: A | Discharge status: Home / Self Care | Payer: BC Managed Care – PPO | Source: Home / Self Care | Attending: Family Medicine | Admitting: Family Medicine

## 2012-07-28 DIAGNOSIS — M549 Dorsalgia, unspecified: Secondary | ICD-10-CM

## 2012-07-28 MED ORDER — KETOROLAC TROMETHAMINE 30 MG/ML IJ SOLN
INTRAMUSCULAR | Status: AC
  Filled 2012-07-28: qty 1

## 2012-07-28 MED ORDER — CYCLOBENZAPRINE HCL 5 MG PO TABS: 5.0000 mg | ORAL_TABLET | Freq: Three times a day (TID) | ORAL | Status: DC | PRN

## 2012-07-28 MED ORDER — KETOROLAC TROMETHAMINE 10 MG PO TABS: 10.0000 mg | ORAL_TABLET | Freq: Four times a day (QID) | ORAL | Status: DC | PRN

## 2012-07-28 MED ORDER — KETOROLAC TROMETHAMINE 30 MG/ML IJ SOLN
30.0000 mg | Freq: Once | INTRAMUSCULAR | Status: AC
  Administered 2012-07-28: 30 mg via INTRAMUSCULAR

## 2012-07-28 NOTE — ED Provider Notes (Signed)
History     CSN: [id number]  Arrival date & time 07/28/12  1647   First MD Initiated Contact with Patient 07/28/12 1728      Chief Complaint  Patient presents with  . Arm Pain    (Consider location/radiation/quality/duration/timing/severity/associated sxs/prior treatment) Patient is a 47 y.o. female presenting with arm pain. The history is provided by the patient.  Arm Pain This is a new problem. The current episode started 2 days ago. The problem has been gradually worsening. Associated symptoms include shortness of breath. Pertinent negatives include no chest pain and no abdominal pain. The symptoms are aggravated by twisting and bending (palpation increases pain.).    Past Medical History  Diagnosis Date  . Hyperlipidemia   . HYPOTHYROIDISM 01/30/2007  . 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/2007  . ASTHMA 01/30/2007  . GERD 01/31/2007  . Hiatal hernia   . 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 Hemorrhoids   . HTN (hypertension) 09/09/2011    Past Surgical History  Procedure Laterality Date  . Abdominal hysterectomy    . Tubal ligation  1987  . Broken jaw    . Breast reduction surgery  1986  . Carpal tunnel release  2001  . Thyroidectomy      Family History  Problem Relation Age of Onset  . Hypertension Father   . Diabetes Father   . Breast cancer Sister   . Colon cancer Neg Hx   . Diabetes Maternal Grandmother   . Heart disease Maternal Grandmother   . Asthma Mother     History  Substance Use Topics  . Smoking status: Former Smoker    Quit date: 06/05/1979  . Smokeless tobacco: Former Neurosurgeon    Types: Snuff  . Alcohol Use: No    OB History   Grav Para Term Preterm Abortions TAB SAB Ect Mult Living                  Review of Systems  Constitutional: Negative.   HENT: Negative.   Respiratory: Positive for shortness of breath.   Cardiovascular: Negative for chest pain, palpitations and leg swelling.  Gastrointestinal: Negative.  Negative for abdominal pain.    Musculoskeletal: Positive for back pain. Negative for joint swelling and gait problem.    Allergies  Codeine  Home Medications   Current Outpatient Rx  Name  Route  Sig  Dispense  Refill  . albuterol (VENTOLIN HFA) 108 (90 BASE) MCG/ACT inhaler   Inhalation   Inhale 2 puffs into the lungs every 4 (four) hours as needed. For shortness of breath.         [name] amLODipine (NORVASC) 5 MG tablet   Oral   Take 1 tablet (5 mg total) by mouth daily.   90 tablet   3   . Chlorpheniramine-APAP (CORICIDIN) 2-325 MG TABS   Oral   Take 1 tablet by mouth as needed. For cold, cough, and flu symptoms.         . cyclobenzaprine (FLEXERIL) 5 MG tablet   Oral   Take 1 tablet (5 mg total) by mouth 3 (three) times daily as needed for muscle spasms.   30 tablet   0   . fluticasone (FLONASE) 50 MCG/ACT nasal spray   Nasal   Place 2 sprays into the nose daily.   16 g   6   . HYDROcodone-homatropine (HYCODAN) 5-1.5 MG/5ML syrup   Oral   Take 5 mLs by mouth every 8 (eight) hours as needed for cough.   120  mL   0   . ketorolac (TORADOL) 10 MG tablet   Oral   Take 1 tablet (10 mg total) by mouth every 6 (six) hours as needed for pain.   20 tablet   0   . levofloxacin (LEVAQUIN) 500 MG tablet   Oral   Take 1 tablet (500 mg total) by mouth daily.   7 tablet   0   . levothyroxine (SYNTHROID, LEVOTHROID) 137 MCG tablet      TAKE ONE TABLET BY MOUTH EVERY DAY   90 tablet   3   . pantoprazole (PROTONIX) 40 MG tablet   Oral   Take 1 tablet (40 mg total) by mouth daily.   90 tablet   3   . vitamin B-12 (CYANOCOBALAMIN) 1000 MCG tablet   Oral   Take 1,000 mcg by mouth daily.           BP 161/95  Pulse 91  Temp(Src) 98.1 F (36.7 C) (Oral)  Resp 19  SpO2 99%  Physical Exam  Nursing note and vitals reviewed. Constitutional: She is oriented to person, place, and time. She appears well-developed and well-nourished.  HENT:  Head: Normocephalic.  Mouth/Throat: Oropharynx  is clear and moist.  Eyes: Conjunctivae are normal. Pupils are equal, round, and reactive to light.  Neck: Normal range of motion. Neck supple.  Cardiovascular: Normal rate, regular rhythm, normal heart sounds and intact distal pulses.   Pulmonary/Chest: Effort normal and breath sounds normal.  Abdominal: Soft. Bowel sounds are normal. There is no tenderness.  Musculoskeletal: She exhibits tenderness.       Back:  Lymphadenopathy:    She has no cervical adenopathy.  Neurological: She is alert and oriented to person, place, and time.  Skin: Skin is warm and dry.    ED Course  Procedures (including critical care time)  Labs Reviewed - No data to display Dg Thoracic Spine 2 View  07/28/2012  *RADIOLOGY REPORT*  Clinical Data: Arm pain, upper back pain for 3 days, no known injury  THORACIC SPINE - 2 VIEW  Comparison: Chest radiograph 05/15/2012  Findings: 12 pairs of ribs. Vertebral body heights maintained without fracture or subluxation. Scattered end plate spur formation and disc space narrowing thoracic spine. Visualized posterior ribs grossly intact.  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 No acute abnormalities.   Original Report Authenticated By: Ulyses Southward, M.D.      1. Back pain, acute       MDM  X-rays reviewed and report per radiologist.         Linna Hoff, MD 07/29/12 1003

## 2012-07-28 NOTE — ED Notes (Signed)
Pt c/o left arm pain since the weekend Pain is intermittent along w/numbness; radiates towards back; "feels like some one has there knee to her" Episodes will last for 2 minutes Sx include: SOB, headache Denies: chest pain, blurry vision, edema Took antiacids thinking it was her Acid Reflux and had to aspirins this am around 1100 Went to [facility] ER last year for the same sx  She is alert w/no signs of acute distress.

## 2012-08-12 ENCOUNTER — Encounter: Payer: Self-pay | Admitting: Oncology

## 2012-08-12 ENCOUNTER — Ambulatory Visit (HOSPITAL_BASED_OUTPATIENT_CLINIC_OR_DEPARTMENT_OTHER): Payer: BC Managed Care – PPO | Admitting: Oncology

## 2012-08-12 ENCOUNTER — Other Ambulatory Visit (HOSPITAL_BASED_OUTPATIENT_CLINIC_OR_DEPARTMENT_OTHER): Payer: BC Managed Care – PPO | Admitting: Lab

## 2012-08-12 VITALS — BP 148/97 | HR 101 | Temp 98.3°F | Resp 18 | Ht 64.0 in | Wt 203.2 lb

## 2012-08-12 DIAGNOSIS — D509 Iron deficiency anemia, unspecified: Secondary | ICD-10-CM

## 2012-08-12 DIAGNOSIS — R0602 Shortness of breath: Secondary | ICD-10-CM

## 2012-08-12 LAB — COMPREHENSIVE METABOLIC PANEL (CC13)
AST: 18 U/L (ref 5–34)
Albumin: 3.4 g/dL — ABNORMAL LOW (ref 3.5–5.0)
Alkaline Phosphatase: 74 U/L (ref 40–150)
Calcium: 8.9 mg/dL (ref 8.4–10.4)
Chloride: 104 mEq/L (ref 98–107)
Glucose: 95 mg/dl (ref 70–99)
Potassium: 3.6 mEq/L (ref 3.5–5.1)
Sodium: 139 mEq/L (ref 136–145)
Total Protein: 7.8 g/dL (ref 6.4–8.3)

## 2012-08-12 LAB — CBC WITH DIFFERENTIAL/PLATELET
EOS%: 1.1 % (ref 0.0–7.0)
Eosinophils Absolute: 0.1 10*3/uL (ref 0.0–0.5)
LYMPH%: 43.5 % (ref 14.0–49.7)
MCH: 26.5 pg (ref 25.1–34.0)
MCHC: 33 g/dL (ref 31.5–36.0)
MCV: 80.2 fL (ref 79.5–101.0)
MONO%: 8.7 % (ref 0.0–14.0)
NEUT#: 2.6 10*3/uL (ref 1.5–6.5)
Platelets: 221 10*3/uL (ref 145–400)
RBC: 4.25 10*6/uL (ref 3.70–5.45)
RDW: 14.1 % (ref 11.2–14.5)

## 2012-08-12 LAB — IRON AND TIBC: TIBC: 360 ug/dL (ref 250–470)

## 2012-08-12 LAB — FOLATE: Folate: 15.9 ng/mL

## 2012-08-12 NOTE — Progress Notes (Signed)
CC:   Corwin Levins, MD Miguel Aschoff, M.D. Venita Lick. Russella Dar, MD, [facility]  PROBLEM LIST:  1. History of iron-deficiency anemia extending back to at least to May  of 2005 felt to be due to excessive menstrual losses. The patient  has received intravenous iron on several occasions throughout the  years. She has had stools that were negative for occult blood x3  on 08/10/2009 and 12/19/2010. She has undergone endoscopy and  colonoscopy by Dr. Claudette Head on 12/25/2010. The patient has  received Feraheme 510 mg IV on 11/10/2010, 11/17/2010, 08/01/2011  and 08/08/2011.  2. History of asthma.  3. Hypertension.  4. Gastroesophageal reflux disease.  5. Irritable bowel syndrome.  6. Hypothyroidism, status post thyroidectomy in 1979. 7. History of episodic shortness of breath and irregular breathing with negative CT angiogram of the chest carried out on 12/17/2011 and negative chest x-ray from 05/15/2012.  Symptoms were present going back to July 2013.  Roger has been to the emergency room on more than 1 occasion for this problem. 8. Pain, numbness and paresthesias involving left hand.  The possibility of carpal tunnel syndrome needs to be investigated.   MEDICATIONS:  Reviewed and recorded. Current Outpatient Prescriptions  Medication Sig Dispense Refill  . albuterol (VENTOLIN HFA) 108 (90 BASE) MCG/ACT inhaler Inhale 2 puffs into the lungs every 4 (four) hours as needed. For shortness of breath.      [name] amLODipine (NORVASC) 5 MG tablet Take 1 tablet (5 mg total) by mouth daily.  90 tablet  3  . Chlorpheniramine-APAP (CORICIDIN) 2-325 MG TABS Take 1 tablet by mouth as needed. For cold, cough, and flu symptoms.      . cyclobenzaprine (FLEXERIL) 5 MG tablet Take 1 tablet (5 mg total) by mouth 3 (three) times daily as needed for muscle spasms.  30 tablet  0  . fluticasone (FLONASE) 50 MCG/ACT nasal spray Place 2 sprays into the nose daily.  16 g  6  . HYDROcodone-homatropine (HYCODAN) 5-1.5 MG/5ML  syrup Take 5 mLs by mouth every 8 (eight) hours as needed for cough.  120 mL  0  . ketorolac (TORADOL) 10 MG tablet Take 1 tablet (10 mg total) by mouth every 6 (six) hours as needed for pain.  20 tablet  0  . levofloxacin (LEVAQUIN) 500 MG tablet Take 1 tablet (500 mg total) by mouth daily.  7 tablet  0  . levothyroxine (SYNTHROID, LEVOTHROID) 137 MCG tablet TAKE ONE TABLET BY MOUTH EVERY DAY  90 tablet  3  . pantoprazole (PROTONIX) 40 MG tablet Take 1 tablet (40 mg total) by mouth daily.  90 tablet  3  . vitamin B-12 (CYANOCOBALAMIN) 1000 MCG tablet Take 1,000 mcg by mouth daily.       No current facility-administered medications for this visit.    SMOKING HISTORY:  Will be extracted from the note of 12/04/2011.   HISTORY:  Marilyn [name] was seen today for followup of her iron deficiency anemia felt to be due to excessive menstrual losses in the past.  Valorie was last seen by [place] on 04/07/2012 and prior to that on 12/04/2011.  She tells me that she has not had any menstrual periods since August 2013.  She saw her gynecologist, Dr. Miguel Aschoff, recently and had rectal exam that was negative for occult blood.  A mammogram apparently was also negative.  We do not have that report.  The patient denies any obvious blood in her stools or melena.  She did have  a colonoscopy and endoscopy by Dr. Claudette Head on 12/25/2010.  Her last dose of IV Feraheme 510 mg was on 08/01/2011 and 08/08/2011. Callan denies any pagophagia.  In addition to the difficulty she has with irregular breathing and shortness of breath which can occur any time, either at rest or with exertion, Momina also complains of some numbness, paresthesias and pain in her left hand and also some pain in her right shoulder.  I have suggested that she may want to see a pulmonary specialist for her respiratory symptoms.  She does apparently have a history of asthma and wheezing.  She is on an inhaler as needed.  She may need to see  an orthopedic specialist or hand specialist regarding her other symptoms. She may have a carpal tunnel problem to explain the left hand symptoms.  PHYSICAL EXAMINATION:  Stevana is an obese, well-developed, well- nourished generally healthy-appearing young woman.  She is 47 years old. Weight is 203 pounds 3.2 ounces.  Height 5 feet 4 inches.  Body surface area 2.04 sq m.  Blood pressure 148/97.  Omnia was made aware of her elevated blood pressure.  She has had elevated blood pressures in the past and has a history of hypertension.  She is on Norvasc.  Other vital signs are normal.  There is no scleral icterus.  Mouth and pharynx are benign.  There is no peripheral adenopathy palpable.  No axillary adenopathy.  Breasts are not examined.  Heart and lungs are normal. Abdomen is obese, nontender with no organomegaly or masses palpable. Extremities, no peripheral edema or clubbing.  Neurologic exam was normal.  LABORATORY DATA:  Today, white count 5.6, ANC 2.6, hemoglobin 11.2, hematocrit 34.1, platelets 221,000.  On 04/07/2012 hemoglobin was 11.9, hematocrit 35.3 and on 12/04/2011 hemoglobin was 12.3, hematocrit 36.7. Today MCV was 80.2, MCH 26.5.  Chemistries today notable for an albumin of 3.4, otherwise normal.  BUN 14, creatinine 0.8 and LDH 210.  Iron studies are pending.  On 04/07/2012 ferritin was 179, and on 12/04/2011 ferritin was 390.  On 07/26/2011 ferritin was 73 down from 287 on 03/09/2011.  The patient did receive 2 doses of Feraheme 510 mg each on 08/01/2011 and 08/08/2011.  IMAGING STUDIES:  1. CT angiogram of the chest with IV contrast on 04/03/2010 showed no  evidence for an aortic dissection or pulmonary embolism. There was  mild ectasia of the ascending thoracic aorta. There was trace  pericardial fluid and some scattered minimal to mild atelectatic  changes. It was noted that the present study was not optimized to  evaluate for pulmonary embolism and therefore this  diagnostic  possibility could not be addressed.  2. CT scan of the neck without IV contrast on 07/25/2011 showed no  significant soft tissue abnormalities. There were mild  degenerative changes along the lower cervical spine.  3. Chest x-ray, 2 view, from 11/16/2011 showed no active disease.  4. Complete abdominal ultrasound from 11/16/2011 showed no acute  abdominal findings. 5. CT angiogram of the chest on 12/17/2011 showed no acute disease and no evidence for pulmonary emboli, infiltrates, effusions or other significant abnormalities.  There might be some slight chronic pericardial thickening unchanged from prior CT scan of the chest dated 04/03/2010. 6. Chest x-ray, 2 view, from 05/15/2012 was negative. 7. Thoracic spine, 2 view from 07/28/2012 showed mild scattered degenerative disk disease with no acute abnormalities.    IMPRESSION AND PLAN:  Ahlam's hemoglobin and hematocrit are slightly lower than they have been dating back  to 12/04/2011.  We are awaiting the results of the iron studies.  It is possible that Rily may need some additional iron.  We would probably give her 1020 mg in 1 dose.  I should mention that Shonette did have a mammogram in the office of Dr. Tenny Craw.  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 I suggested she may want to have an appointment with pulmonary specialist to evaluate her respiratory issues.  These do not seem to be new.  She also may need evaluation to rule out carpal tunnel syndrome. She also has had some right shoulder pain that may need evaluation. Tenlee apparently went to the emergency room on 07/28/2012 for some back and arm pain that was apparently felt to be due to muscle spasms.    ______________________________ Samul Dada, M.D. DSM/MEDQ  D:  08/12/2012  T:  08/12/2012  Job:  [id number]

## 2012-08-12 NOTE — Progress Notes (Signed)
This office note has been dictated.  #[id number]

## 2012-08-18 ENCOUNTER — Telehealth: Payer: Self-pay | Admitting: Oncology

## 2012-08-18 ENCOUNTER — Telehealth: Payer: Self-pay | Admitting: Medical Oncology

## 2012-08-18 NOTE — Telephone Encounter (Signed)
When Thurston Hole called pt with her appts she thought she needed to get more Iron. I called her to let her know that her ferritin is 260 and currently does not need feraheme. Dr. Arline Asp has ordered a CBC and ferritin in June and then follow up with labs in Sept. If we need to scheudle her for feraheme after her June labs we will cal her. She voiced understanding.

## 2012-08-18 NOTE — Telephone Encounter (Signed)
, °

## 2012-09-09 ENCOUNTER — Telehealth: Payer: Self-pay | Admitting: Oncology

## 2012-11-14 ENCOUNTER — Other Ambulatory Visit: Payer: BC Managed Care – PPO

## 2012-11-17 ENCOUNTER — Other Ambulatory Visit (HOSPITAL_BASED_OUTPATIENT_CLINIC_OR_DEPARTMENT_OTHER): Payer: BC Managed Care – PPO

## 2012-11-17 DIAGNOSIS — D509 Iron deficiency anemia, unspecified: Secondary | ICD-10-CM

## 2012-11-17 LAB — FERRITIN: Ferritin: 240 ng/mL (ref 10–291)

## 2012-11-17 LAB — CBC WITH DIFFERENTIAL/PLATELET
EOS%: 1.4 % (ref 0.0–7.0)
MCH: 26.9 pg (ref 25.1–34.0)
MCV: 79.3 fL — ABNORMAL LOW (ref 79.5–101.0)
MONO%: 7.9 % (ref 0.0–14.0)
NEUT#: 2.1 10*3/uL (ref 1.5–6.5)
RBC: 4.64 10*6/uL (ref 3.70–5.45)
RDW: 13.6 % (ref 11.2–14.5)

## 2012-12-11 ENCOUNTER — Telehealth: Payer: Self-pay

## 2012-12-11 NOTE — Telephone Encounter (Signed)
Called left message to call back 

## 2012-12-11 NOTE — Telephone Encounter (Signed)
Patient informed and agreed to schedule ROV

## 2012-12-11 NOTE — Telephone Encounter (Signed)
Pt may not need osa evaluation if she is not falling asleep during the day  Please make ROV at her next most convenienct time

## 2012-12-11 NOTE — Telephone Encounter (Signed)
The patient called requesting to be referred for sleep apnea.  She states she is snoring more at night and waking up more often.  Also she said PCP has not followed up on her BP.  The patient was informed she needs to schedule appt. With PCP as has not been seen since June of 2013.  Please advise on all request.

## 2012-12-15 ENCOUNTER — Encounter: Payer: Self-pay | Admitting: Internal Medicine

## 2012-12-15 ENCOUNTER — Ambulatory Visit (INDEPENDENT_AMBULATORY_CARE_PROVIDER_SITE_OTHER)
Admission: RE | Admit: 2012-12-15 | Discharge: 2012-12-15 | Disposition: A | Discharge status: Home / Self Care | Payer: BC Managed Care – PPO | Source: Ambulatory Visit | Attending: Internal Medicine | Admitting: Internal Medicine

## 2012-12-15 ENCOUNTER — Other Ambulatory Visit: Payer: Self-pay | Admitting: Internal Medicine

## 2012-12-15 ENCOUNTER — Ambulatory Visit (INDEPENDENT_AMBULATORY_CARE_PROVIDER_SITE_OTHER): Payer: BC Managed Care – PPO | Admitting: Internal Medicine

## 2012-12-15 ENCOUNTER — Other Ambulatory Visit (INDEPENDENT_AMBULATORY_CARE_PROVIDER_SITE_OTHER): Payer: BC Managed Care – PPO

## 2012-12-15 VITALS — BP 140/100 | HR 84 | Temp 98.2°F | Ht 64.0 in | Wt 197.0 lb

## 2012-12-15 DIAGNOSIS — M25511 Pain in right shoulder: Secondary | ICD-10-CM

## 2012-12-15 DIAGNOSIS — G4710 Hypersomnia, unspecified: Secondary | ICD-10-CM | POA: Insufficient documentation

## 2012-12-15 DIAGNOSIS — I10 Essential (primary) hypertension: Secondary | ICD-10-CM

## 2012-12-15 DIAGNOSIS — R0989 Other specified symptoms and signs involving the circulatory and respiratory systems: Secondary | ICD-10-CM

## 2012-12-15 DIAGNOSIS — Z0000 Encounter for general adult medical examination without abnormal findings: Secondary | ICD-10-CM

## 2012-12-15 DIAGNOSIS — M25519 Pain in unspecified shoulder: Secondary | ICD-10-CM

## 2012-12-15 DIAGNOSIS — R0609 Other forms of dyspnea: Secondary | ICD-10-CM | POA: Insufficient documentation

## 2012-12-15 DIAGNOSIS — K219 Gastro-esophageal reflux disease without esophagitis: Secondary | ICD-10-CM

## 2012-12-15 HISTORY — DX: Hypersomnia, unspecified: G47.10

## 2012-12-15 LAB — URINALYSIS, ROUTINE W REFLEX MICROSCOPIC
Nitrite: NEGATIVE
Specific Gravity, Urine: 1.025 (ref 1.000–1.030)
Total Protein, Urine: NEGATIVE
pH: 6 (ref 5.0–8.0)

## 2012-12-15 LAB — LDL CHOLESTEROL, DIRECT: Direct LDL: 165.3 mg/dL

## 2012-12-15 MED ORDER — AMLODIPINE BESYLATE 10 MG PO TABS: 10.0000 mg | ORAL_TABLET | Freq: Every day | ORAL | Status: DC

## 2012-12-15 MED ORDER — ALBUTEROL SULFATE HFA 108 (90 BASE) MCG/ACT IN AERS: 2.0000 | INHALATION_SPRAY | RESPIRATORY_TRACT | Status: DC | PRN

## 2012-12-15 MED ORDER — LEVOTHYROXINE SODIUM 125 MCG PO TABS: 125.0000 ug | ORAL_TABLET | Freq: Every day | ORAL | Status: DC

## 2012-12-15 MED ORDER — OMEPRAZOLE 20 MG PO CPDR: 40.0000 mg | DELAYED_RELEASE_CAPSULE | Freq: Every day | ORAL | Status: DC

## 2012-12-15 MED ORDER — BUDESONIDE-FORMOTEROL FUMARATE 160-4.5 MCG/ACT IN AERO: 2.0000 | INHALATION_SPRAY | Freq: Two times a day (BID) | RESPIRATORY_TRACT | Status: DC

## 2012-12-15 NOTE — Assessment & Plan Note (Signed)
For pulm referral - ? OSA 

## 2012-12-15 NOTE — Addendum Note (Signed)
Addended by: Corwin Levins on: 12/15/2012 11:32 AM   Modules accepted: Orders, Level of Service

## 2012-12-15 NOTE — Patient Instructions (Addendum)
Your EKG was ok today Please take all new medication as prescribed - the symbicort at 2 puffs twice per day (to start with the sample) You can also take the prilosec otc at 40 mg per day OK to increase the Norvasc (amlodipine) to 10 mg per day (watch for any leg swelling with this) Please continue all other medications as before, and all refills have been done You will be contacted regarding the referral for: pulmonary for the snoring/sleepiness Please go to the XRAY Department in the Basement (go straight as you get off the elevator) for the x-ray testing Please go to the LAB in the Basement (turn left off the elevator) for the tests to be done today You will be contacted by phone if any changes need to be made immediately.  Otherwise, you will receive a letter about your results with an explanation, but please check with MyChart first. Please keep your appointments with your specialists as you have planned  Please remember to sign up for My Chart if you have not done so, as this will be important to you in the future with finding out test results, communicating by private email, and scheduling acute appointments online when needed.  Please return in 6 months, or sooner if needed

## 2012-12-15 NOTE — Assessment & Plan Note (Signed)

## 2012-12-15 NOTE — Progress Notes (Addendum)
Subjective:    Patient ID: Marilyn [name], female    DOB: [birth year], 47 y.o.   MRN: [id number]  HPI  Here for wellness and f/u;  Overall doing ok;  Pt denies CP,  orthopnea, PND, worsening LE edema, palpitations, dizziness or syncope, has some sob/doe with somewhat worse asthma recent.  Pt denies neurological change such as new headache, facial or extremity weakness.  Pt denies polydipsia, polyuria, or low sugar symptoms. Pt states overall good compliance with treatment and medications, good tolerability, and has been trying to follow lower cholesterol diet.  Pt denies worsening depressive symptoms, suicidal ideation or panic. No fever, night sweats, wt loss, loss of appetite, or other constitutional symptoms.  Pt states good ability with ADL's, has low fall risk, home safety reviewed and adequate, no other significant changes in hearing or vision, and only occasionally active with exercise.  Has marked snoring at night, and daytime somnolence most days, daughter told her she needed eval after recent vacation with her. Right shoudler has been painful as of late as well  Past Medical History  Diagnosis Date  . Hyperlipidemia   . HYPOTHYROIDISM 01/30/2007  . 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/2007  . ASTHMA 01/30/2007  . GERD 01/31/2007  . Hiatal hernia   . 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 Hemorrhoids   . HTN (hypertension) 09/09/2011   Past Surgical History  Procedure Laterality Date  . Abdominal hysterectomy    . Tubal ligation  1987  . Broken jaw    . Breast reduction surgery  1986  . Carpal tunnel release  2001  . Thyroidectomy      reports that she quit smoking about 33 years ago. She has quit using smokeless tobacco. Her smokeless tobacco use included Snuff. She reports that she does not drink alcohol or use illicit drugs. family history includes Asthma in her mother; Breast cancer in her sister; Diabetes in her father and maternal grandmother; Heart disease in her maternal grandmother; and  Hypertension in her father.  There is no history of Colon cancer. Allergies  Allergen Reactions  . Codeine Other (See Comments)    Makes really sick   Current Outpatient Prescriptions on File Prior to Visit  Medication Sig Dispense Refill  . albuterol (VENTOLIN HFA) 108 (90 BASE) MCG/ACT inhaler Inhale 2 puffs into the lungs every 4 (four) hours as needed. For shortness of breath.      [name] amLODipine (NORVASC) 5 MG tablet Take 1 tablet (5 mg total) by mouth daily.  90 tablet  3  . fluticasone (FLONASE) 50 MCG/ACT nasal spray Place 2 sprays into the nose daily.  16 g  6  . ketorolac (TORADOL) 10 MG tablet Take 1 tablet (10 mg total) by mouth every 6 (six) hours as needed for pain.  20 tablet  0  . levothyroxine (SYNTHROID, LEVOTHROID) 137 MCG tablet TAKE ONE TABLET BY MOUTH EVERY DAY  90 tablet  3  . vitamin B-12 (CYANOCOBALAMIN) 1000 MCG tablet Take 1,000 mcg by mouth daily.      . pantoprazole (PROTONIX) 40 MG tablet Take 1 tablet (40 mg total) by mouth daily.  90 tablet  3   No current facility-administered medications on file prior to visit.   Review of Systems Constitutional: Negative for diaphoresis, activity change, appetite change or unexpected weight change.  HENT: Negative for hearing loss, ear pain, facial swelling, mouth sores and neck stiffness.   Eyes: Negative for pain, redness and visual disturbance.  Respiratory: Negative for shortness of  breath and wheezing.   Cardiovascular: Negative for chest pain and palpitations.  Gastrointestinal: Negative for diarrhea, blood in stool, abdominal distention or other pain Genitourinary: Negative for hematuria, flank pain or change in urine volume.  Musculoskeletal: Negative for myalgias and joint swelling.  Skin: Negative for color change and wound.  Neurological: Negative for syncope and numbness. other than noted Hematological: Negative for adenopathy.  Psychiatric/Behavioral: Negative for hallucinations, self-injury, decreased  concentration and agitation.      Objective:   Physical Exam BP 140/100  Pulse 84  Temp(Src) 98.2 F (36.8 C) (Oral)  Ht 5\' 4"  (1.626 m)  Wt 197 lb (89.359 kg)  BMI 33.8 kg/m2  SpO2 96% VS noted, morbid obese Constitutional: Pt is oriented to person, place, and time. Appears well-developed and well-nourished.  Head: Normocephalic and atraumatic.  Right Ear: External ear normal.  Left Ear: External ear normal.  Nose: Nose normal.  Mouth/Throat: Oropharynx is clear and moist.  Eyes: Conjunctivae and EOM are normal. Pupils are equal, round, and reactive to light.  Neck: Normal range of motion. Neck supple. No JVD present. No tracheal deviation present.  Cardiovascular: Normal rate, regular rhythm, normal heart sounds and intact distal pulses.   Pulmonary/Chest: Effort normal and breath sounds normal.  Abdominal: Soft. Bowel sounds are normal. There is no tenderness. No HSM  Musculoskeletal: Normal range of motion. Exhibits no edema.  Lymphadenopathy:  Has no cervical adenopathy.  Neurological: Pt is alert and oriented to person, place, and time. Pt has normal reflexes. No cranial nerve deficit.  Skin: Skin is warm and dry. No rash noted.  Right shoulder diffuse tender, with  mild decreased ROM Psychiatric:  Has  normal mood and affect. Behavior is normal.     Assessment & Plan:

## 2012-12-15 NOTE — Assessment & Plan Note (Signed)
Uncontrolled, to increase the amlodipine 10

## 2012-12-15 NOTE — Assessment & Plan Note (Signed)
For PPI re-staRT

## 2012-12-15 NOTE — Assessment & Plan Note (Signed)
For film today - ? djd

## 2012-12-15 NOTE — Assessment & Plan Note (Signed)
prob multifact , obesity ,anemia but ? Asthma worsening - to add symbicort

## 2012-12-19 ENCOUNTER — Encounter: Payer: Self-pay | Admitting: Internal Medicine

## 2012-12-19 ENCOUNTER — Ambulatory Visit (INDEPENDENT_AMBULATORY_CARE_PROVIDER_SITE_OTHER): Payer: BC Managed Care – PPO | Admitting: Internal Medicine

## 2012-12-19 VITALS — BP 130/90 | HR 90 | Temp 98.1°F | Ht 62.5 in | Wt 201.2 lb

## 2012-12-19 DIAGNOSIS — R05 Cough: Secondary | ICD-10-CM

## 2012-12-19 DIAGNOSIS — K219 Gastro-esophageal reflux disease without esophagitis: Secondary | ICD-10-CM

## 2012-12-19 DIAGNOSIS — J45909 Unspecified asthma, uncomplicated: Secondary | ICD-10-CM

## 2012-12-19 MED ORDER — BENZONATATE 200 MG PO CAPS: 200.0000 mg | ORAL_CAPSULE | Freq: Three times a day (TID) | ORAL | Status: DC | PRN

## 2012-12-19 NOTE — Patient Instructions (Addendum)
dexilant 60 mg Take 30-60 min before first meal of the day and Pepcid ac 20 mg at bedtime  Ok to tessilon pearls as needed   Only use symbicort 1-2 puffs every 12 hours if needed if you can't catch your breath  GERD (REFLUX)  is an extremely common cause of respiratory symptoms, many times with no significant heartburn at all.    It can be treated with medication, but also with lifestyle changes including avoidance of late meals, excessive alcohol, smoking cessation, and avoid fatty foods, chocolate, peppermint, colas, red wine, and acidic juices such as orange juice.  NO MINT OR MENTHOL PRODUCTS SO NO COUGH DROPS  USE SUGARLESS CANDY INSTEAD (jolley ranchers or Stover's)  NO OIL BASED VITAMINS - use powdered substitutes.  Please schedule a follow up office visit in 2  weeks, sooner if needed

## 2012-12-19 NOTE — Progress Notes (Signed)
  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  Supposed to be on prilosec but not consistently either.   No obvious daytime variabilty or assoc  cp or chest tightness, subjective wheeze overt sinus or hb symptoms. No unusual exp hx or h/o childhood pna/ asthma or knowledge of premature birth.   Sleeping ok without nocturnal  or early am exacerbation  of respiratory  c/o's or need for noct saba. Also denies any obvious fluctuation of symptoms with weather or environmental changes or other aggravating or alleviating factors except as outlined above   Review of Systems  Constitutional: Negative for fever, chills and unexpected weight change.  HENT: Positive for congestion and postnasal drip. Negative for ear pain, nosebleeds, sore throat, rhinorrhea, sneezing, trouble swallowing, dental problem, voice change and sinus pressure.   Eyes: Negative for visual disturbance.  Respiratory: Positive for cough and shortness of breath. Negative for choking.   Cardiovascular: Positive for chest pain. Negative for leg swelling.  Gastrointestinal: Negative for vomiting, abdominal pain and diarrhea.  Genitourinary: Negative for difficulty urinating.  Musculoskeletal: Positive for arthralgias.  Skin: Negative for rash.  Neurological: Negative for tremors, syncope and headaches.   Hematological: Does not bruise/bleed easily.       Objective:   Physical Exam  Obese bf nad   Wt Readings from Last 3 Encounters:  12/19/12 201 lb 3.2 oz (91.264 kg)  12/15/12 197 lb (89.359 kg)  08/12/12 203 lb 3.2 oz (92.171 kg)     HEENT: nl dentition, turbinates, and orophanx. Nl external ear canals without cough reflex   NECK :  without JVD/Nodes/TM/ nl carotid upstrokes bilaterally   LUNGS: no acc muscle use, clear to A and P bilaterally without cough on insp or exp maneuvers   CV:  RRR  no s3 or murmur or increase in P2, no edema   ABD:  soft and nontender with nl excursion in the supine position. No bruits or organomegaly, bowel sounds nl  MS:  warm without deformities, calf tenderness, cyanosis or clubbing  SKIN: warm and dry without lesions    NEURO:  alert, approp, no deficits     cxr 12/15/12 No active disease.         Assessment & Plan:

## 2012-12-20 DIAGNOSIS — R05 Cough: Secondary | ICD-10-CM | POA: Insufficient documentation

## 2012-12-20 NOTE — Assessment & Plan Note (Signed)
DDX of  difficult airways managment all start with A and  include Adherence, Ace Inhibitors, Acid Reflux, Active Sinus Disease, Alpha 1 Antitripsin deficiency, Anxiety masquerading as Airways dz,  ABPA,  allergy(esp in young), Aspiration (esp in elderly), Adverse effects of DPI,  Active smokers, plus two Bs  = Bronchiectasis and Beta blocker use..and one C= CHF  Adherence is always the initial "prime suspect" and is a multilayered concern that requires a "trust but verify" approach in every patient - starting with knowing how to use medications, especially inhalers, correctly, keeping up with refills and understanding the fundamental difference between maintenance and prns vs those medications only taken for a very short course and then stopped and not refilled.   Clearly she is inconsistent with use of symbicort but not clear this will help if her symptoms are predominantly upper airway  ? Acid reflux playing a role > see cough a/p  The proper method of use, as well as anticipated side effects, of a metered-dose inhaler are discussed and demonstrated to the patient. Improved effectiveness after extensive coaching during this visit to a level of approximately  75% so ok to use sybmibicort if needed but hope that the problem can be corrected by addressing her upper airway symptoms first

## 2012-12-20 NOTE — Assessment & Plan Note (Signed)
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   These are the same pts (now being labeled as having "irritable larynx syndrome" by some cough centers) who not infrequently have a history of having failed to tolerate ace inhibitors,  dry powder inhalers or biphosphonates or report having atypical reflux symptoms that don't respond to standard doses of PPI , and are easily confused as having aecopd or asthma flares by even experienced allergists/ pulmonologists.  For now max rx for gerd and just use symbicort prn as may trigger more upper airway instability

## 2013-01-30 ENCOUNTER — Telehealth: Payer: Self-pay | Admitting: Oncology

## 2013-01-30 NOTE — Telephone Encounter (Signed)
lvm for pt regarding to 9.12 appt being moved to 9.11.14...mailed pt appt sched  avs and letter

## 2013-02-05 ENCOUNTER — Telehealth: Payer: Self-pay | Admitting: Internal Medicine

## 2013-02-05 NOTE — Telephone Encounter (Signed)
Moved 9/11 appt to 10/14 due to schedule change. lmonvm for pt and mailed schedule.

## 2013-02-12 ENCOUNTER — Ambulatory Visit: Payer: BC Managed Care – PPO

## 2013-02-12 ENCOUNTER — Other Ambulatory Visit: Payer: BC Managed Care – PPO | Admitting: Lab

## 2013-02-13 ENCOUNTER — Other Ambulatory Visit: Payer: BC Managed Care – PPO | Admitting: Lab

## 2013-02-13 ENCOUNTER — Ambulatory Visit: Payer: BC Managed Care – PPO

## 2013-02-25 ENCOUNTER — Other Ambulatory Visit: Payer: Self-pay | Admitting: Internal Medicine

## 2013-03-17 ENCOUNTER — Other Ambulatory Visit (HOSPITAL_BASED_OUTPATIENT_CLINIC_OR_DEPARTMENT_OTHER): Payer: BC Managed Care – PPO | Admitting: Lab

## 2013-03-17 ENCOUNTER — Other Ambulatory Visit: Payer: Self-pay | Admitting: Internal Medicine

## 2013-03-17 ENCOUNTER — Telehealth: Payer: Self-pay | Admitting: Internal Medicine

## 2013-03-17 ENCOUNTER — Ambulatory Visit (HOSPITAL_BASED_OUTPATIENT_CLINIC_OR_DEPARTMENT_OTHER): Payer: BC Managed Care – PPO | Admitting: Internal Medicine

## 2013-03-17 VITALS — BP 121/78 | HR 105 | Temp 97.5°F | Resp 18 | Ht 62.5 in | Wt 198.0 lb

## 2013-03-17 DIAGNOSIS — D509 Iron deficiency anemia, unspecified: Secondary | ICD-10-CM

## 2013-03-17 DIAGNOSIS — E0590 Thyrotoxicosis, unspecified without thyrotoxic crisis or storm: Secondary | ICD-10-CM

## 2013-03-17 DIAGNOSIS — I10 Essential (primary) hypertension: Secondary | ICD-10-CM

## 2013-03-17 DIAGNOSIS — N951 Menopausal and female climacteric states: Secondary | ICD-10-CM

## 2013-03-17 LAB — CBC WITH DIFFERENTIAL/PLATELET
BASO%: 0.3 % (ref 0.0–2.0)
EOS%: 1.9 % (ref 0.0–7.0)
HCT: 35.8 % (ref 34.8–46.6)
MCH: 26.7 pg (ref 25.1–34.0)
MCHC: 32.8 g/dL (ref 31.5–36.0)
NEUT%: 44.7 % (ref 38.4–76.8)
lymph#: 2.4 10*3/uL (ref 0.9–3.3)

## 2013-03-17 LAB — IRON AND TIBC CHCC
%SAT: 17 % — ABNORMAL LOW (ref 21–57)
Iron: 57 ug/dL (ref 41–142)
TIBC: 343 ug/dL (ref 236–444)
UIBC: 285 ug/dL (ref 120–384)

## 2013-03-17 LAB — COMPREHENSIVE METABOLIC PANEL (CC13)
ALT: 18 U/L (ref 0–55)
AST: 20 U/L (ref 5–34)
Calcium: 9.1 mg/dL (ref 8.4–10.4)
Chloride: 104 mEq/L (ref 98–109)
Creatinine: 0.8 mg/dL (ref 0.6–1.1)
Total Bilirubin: 0.22 mg/dL (ref 0.20–1.20)

## 2013-03-17 NOTE — Patient Instructions (Signed)

## 2013-03-17 NOTE — Telephone Encounter (Signed)
gv pt appt schedule for April 2015.  °

## 2013-03-18 NOTE — Progress Notes (Signed)
[facility] OFFICE PROGRESS NOTE  Oliver Barre, MD [address] [place] [address] [place] [address]  DIAGNOSIS: ANEMIA-IRON DEFICIENCY - Plan: CBC with Differential, Comprehensive metabolic panel, Ferritin, Iron and TIBC  HOT FLASHES  HYPERTHYROIDISM  HTN (hypertension)  Chief Complaint  Patient presents with  . Anemia-Iron deficiency    CURRENT THERAPY:The patient has received Feraheme 510 mg IV on 11/10/2010, 11/17/2010, 08/01/2011  and 08/08/2011.     INTERVAL HISTORY: Marilyn [name] 47 y.o. female with a history of iron-deficiency anemia secondary to excessive menstrual losses (2005) is here for follow-up.  Patient was last seen on 08/12/2012 by Dr. Kimberlee Nearing.   She reports a history of asthma that has recently flared requiring use of proventil inhaler prn wheezing.  She has a productive cough but denies fevers or chills.  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  She denies any recent emergency room visits or hospitalizations.   She has occasional swelling in her hands.  She reports her last menstrual period was in May of this year.   She otherwise reports occasional spotting.  She denies dyspnea on exertion.  She also denies melena or hematochezia.  Her last colonoscopy/endoscopy was on 12/25/2010 by Dr. Claudette Head.  She denies any pagophagia.  She reports trying to lose weight and considering dietary and exercise modifications with close consultation of her PCP.   MEDICAL HISTORY: Past Medical History  Diagnosis Date  . Hyperlipidemia   . HYPOTHYROIDISM 01/30/2007  . 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/2007  . ASTHMA 01/30/2007  . GERD 01/31/2007  . Hiatal hernia   . 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 Hemorrhoids   . HTN (hypertension) 09/09/2011    INTERIM HISTORY: has HYPERTHYROIDISM; HYPOTHYROIDISM; HYPERLIPIDEMIA; ANEMIA-IRON DEFICIENCY; ASTHMA; GERD; HOT FLASHES; CHEST PAIN; Preventative health care; HTN  (hypertension); Neck pain; Abdominal pain, RUQ; Hypersomnolence; DOE (dyspnea on exertion); Right shoulder pain; and Cough on her problem list.    ALLERGIES:  is allergic to codeine.  MEDICATIONS: has a current medication list which includes the following prescription(s): albuterol, amlodipine, benzonatate, fluticasone, ketorolac, levothyroxine, and vitamin b-12.  SURGICAL HISTORY:  Past Surgical History  Procedure Laterality Date  . Abdominal hysterectomy    . Tubal ligation  1987  . Broken jaw    . Breast reduction surgery  1986  . Carpal tunnel release  2001  . Thyroidectomy     PROBLEM LIST:  1. History of iron-deficiency anemia extending back to at least to May of 2005 felt to be due to excessive menstrual losses. The patient has received intravenous iron on several occasions throughout the years. She has had stools that were negative for occult blood x3 on 08/10/2009 and 12/19/2010. She has undergone endoscopy and  colonoscopy by Dr. Claudette Head on 12/25/2010. The patient has received Feraheme 510 mg IV on 11/10/2010, 11/17/2010, 08/01/2011 and 08/08/2011.  2. History of asthma.  3. Hypertension.  4. Gastroesophageal reflux disease.  5. Irritable bowel syndrome.  6. Hypothyroidism, status post thyroidectomy in 1979.  7. History of episodic shortness of breath and irregular breathing with negative CT angiogram of the chest carried out on 12/17/2011 and negative chest x-ray from 05/15/2012. Symptoms were present going back to July 2013. Milika has been to the emergency room on more than 1  occasion for this problem.  8. Pain, numbness and paresthesias involving left hand. The possibility of carpal tunnel syndrome needs to be investigated.  REVIEW OF SYSTEMS:   Constitutional: Denies fevers, chills or abnormal weight loss Eyes: Denies blurriness of vision Ears, nose, mouth, throat, and face: Denies mucositis or sore throat Respiratory: Denies cough, dyspnea or  wheezes Cardiovascular: Denies palpitation, chest discomfort or lower extremity swelling Gastrointestinal:  Denies nausea, heartburn or change in bowel habits Skin: Denies abnormal skin rashes Lymphatics: Denies new lymphadenopathy or easy bruising Neurological:Denies numbness, tingling or new weaknesses Behavioral/Psych: Mood is stable, no new changes  All other systems were reviewed with the patient and are negative.  PHYSICAL EXAMINATION: ECOG PERFORMANCE STATUS: 0 - Asymptomatic  Blood pressure 121/78, pulse 105, temperature 97.5 F (36.4 C), temperature source Oral, resp. rate 18, height 5' 2.5" (1.588 m), weight 198 lb (89.812 kg).  GENERAL:alert, no distress and comfortable; obese, well-developed and nourished.  SKIN: skin color, texture, turgor are normal, no rashes or significant lesions EYES: normal, Conjunctiva are pink and non-injected, sclera clear OROPHARYNX:no exudate, no erythema and lips, buccal mucosa, and tongue normal  NECK: supple, thyroid normal size, non-tender, without nodularity LYMPH:  no palpable lymphadenopathy in the cervical, axillary or supraclavicular LUNGS: clear to auscultation and percussion with normal breathing effort HEART: regular rate & rhythm and no murmurs and no lower extremity edema ABDOMEN:abdomen soft, non-tender and normal bowel sounds Musculoskeletal:no cyanosis of digits and no clubbing  NEURO: alert & oriented x 3 with fluent speech, no focal motor/sensory deficits   LABORATORY DATA: Results for orders placed in visit on 03/17/13 (from the past 48 hour(s))  CBC WITH DIFFERENTIAL     Status: None   Collection Time    03/17/13  2:14 PM      Result Value Range   WBC 5.3  3.9 - 10.3 10e3/uL   NEUT# 2.4  1.5 - 6.5 10e3/uL   HGB 11.7  11.6 - 15.9 g/dL   HCT 16.1  09.6 - 04.5 %   Platelets 239  145 - 400 10e3/uL   MCV 81.6  79.5 - 101.0 fL   MCH 26.7  25.1 - 34.0 pg   MCHC 32.8  31.5 - 36.0 g/dL   RBC 4.09  8.11 - 9.14 10e6/uL    RDW 13.1  11.2 - 14.5 %   lymph# 2.4  0.9 - 3.3 10e3/uL   MONO# 0.4  0.1 - 0.9 10e3/uL   Eosinophils Absolute 0.1  0.0 - 0.5 10e3/uL   Basophils Absolute 0.0  0.0 - 0.1 10e3/uL   NEUT% 44.7  38.4 - 76.8 %   LYMPH% 45.2  14.0 - 49.7 %   MONO% 7.9  0.0 - 14.0 %   EOS% 1.9  0.0 - 7.0 %   BASO% 0.3  0.0 - 2.0 %  FERRITIN [facility]     Status: Abnormal   Collection Time    03/17/13  2:14 PM      Result Value Range   Ferritin 411 (*) 9 - 269 ng/ml  IRON AND TIBC [facility]     Status: Abnormal   Collection Time    03/17/13  2:14 PM      Result Value Range   Iron 57  41 - 142 ug/dL   TIBC 782  956 - 213 ug/dL   UIBC 086  578 - 469 ug/dL   %SAT 17 (*) 21 - 57 %  COMPREHENSIVE METABOLIC PANEL ([facility]13)     Status: Abnormal   Collection Time    03/17/13  2:14 PM      Result Value Range   Sodium 140  136 - 145 mEq/L   Potassium 3.8  3.5 - 5.1 mEq/L   Chloride 104  98 - 109 mEq/L   CO2 26  22 - 29 mEq/L   Glucose 109  70 - 140 mg/dl   BUN 16.1  7.0 - 09.6 mg/dL   Creatinine 0.8  0.6 - 1.1 mg/dL   Total Bilirubin 0.45  0.20 - 1.20 mg/dL   Alkaline Phosphatase 73  40 - 150 U/L   AST 20  5 - 34 U/L   ALT 18  0 - 55 U/L   Total Protein 8.0  6.4 - 8.3 g/dL   Albumin 3.4 (*) 3.5 - 5.0 g/dL   Calcium 9.1  8.4 - 40.9 mg/dL   Anion Gap 10  3 - 11 mEq/L       Labs:  Lab Results  Component Value Date   WBC 5.3 03/17/2013   HGB 11.7 03/17/2013   HCT 35.8 03/17/2013   MCV 81.6 03/17/2013   PLT 239 03/17/2013   NEUTROABS 2.4 03/17/2013      Chemistry      Component Value Date/Time   NA 140 03/17/2013 1414   NA 138 12/04/2011 1434   K 3.8 03/17/2013 1414   K 3.8 12/04/2011 1434   CL 104 08/12/2012 1329   CL 104 12/04/2011 1434   CO2 26 03/17/2013 1414   CO2 27 12/04/2011 1434   BUN 16.1 03/17/2013 1414   BUN 15 12/04/2011 1434   CREATININE 0.8 03/17/2013 1414   CREATININE 0.86 12/04/2011 1434      Component Value Date/Time   CALCIUM 9.1 03/17/2013 1414   CALCIUM 8.8 12/04/2011 1434   ALKPHOS 73  03/17/2013 1414   ALKPHOS 58 12/04/2011 1434   AST 20 03/17/2013 1414   AST 16 12/04/2011 1434   ALT 18 03/17/2013 1414   ALT 12 12/04/2011 1434   BILITOT 0.22 03/17/2013 1414   BILITOT 0.3 12/04/2011 1434     Basic Metabolic Panel:  Recent Labs Lab 03/17/13 1414  NA 140  K 3.8  CO2 26  GLUCOSE 109  BUN 16.1  CREATININE 0.8  CALCIUM 9.1   GFR Estimated Creatinine Clearance: 89.6 ml/min (by C-G formula based on Cr of 0.8). Liver Function Tests:  Recent Labs Lab 03/17/13 1414  AST 20  ALT 18  ALKPHOS 73  BILITOT 0.22  PROT 8.0  ALBUMIN 3.4*   CBC:  Recent Labs Lab 03/17/13 1414  WBC 5.3  NEUTROABS 2.4  HGB 11.7  HCT 35.8  MCV 81.6  PLT 239   Anemia work up  Recent Labs  03/17/13 1414  FERRITIN 411*  TIBC 343  IRON 57   Studies:  No results found.   RADIOGRAPHIC STUDIES: No results found.  ASSESSMENT: OCTOBER PEERY 47 y.o. female with a history of ANEMIA-IRON DEFICIENCY - Plan: CBC with Differential, Comprehensive metabolic panel, Ferritin, Iron and TIBC  HOT FLASHES  HYPERTHYROIDISM  HTN (hypertension)   PLAN:  1. Iron deficiency anemia secondary to menstruation.  -- Her hemoglobin in 11.7 down from 12.5 on 11/17/12 but within her range.  She denies any prolonged menses or symptoms of anemia presently.   Her iron studies are significant for a ferritin of over 400.  She does not need an a feraheme infusion today.  We will plan on checking her CBC with differential, CMP and iron studies upon her return visit in 6 months. She was provided a handout of symptoms of anemia.    2. Low  TSH. --She was counseled to follow-up for repeat TSH once on dose for a few months.  She is trying to obtain an endocrinologist.    All questions were answered. The patient knows to call the clinic with any problems, questions or concerns. We can certainly see the patient much sooner if necessary.  I spent 10 minutes counseling the patient face to face. The total  time spent in the appointment was 15 minutes.    Stevie Ertle, MD 03/18/2013 10:11 AM

## 2013-03-30 ENCOUNTER — Telehealth: Payer: Self-pay | Admitting: Internal Medicine

## 2013-03-30 NOTE — Telephone Encounter (Signed)
03/30/2013  Pt left message requesting a refill on z-pack to be sent to [facility] pharmacy on file.  Did not see a Z-pack RX in chart; pt's call back # [phone]

## 2013-03-31 NOTE — Telephone Encounter (Signed)
Please consider OV , such as with regina as well

## 2013-03-31 NOTE — Telephone Encounter (Signed)
Called the patient left a detailed message of MD instructions on medication. 

## 2013-05-01 ENCOUNTER — Encounter: Payer: Self-pay | Admitting: Internal Medicine

## 2013-05-01 ENCOUNTER — Ambulatory Visit (INDEPENDENT_AMBULATORY_CARE_PROVIDER_SITE_OTHER): Payer: BC Managed Care – PPO | Admitting: Internal Medicine

## 2013-05-01 VITALS — BP 130/84 | HR 101 | Temp 97.2°F | Ht 62.5 in | Wt 199.5 lb

## 2013-05-01 DIAGNOSIS — J029 Acute pharyngitis, unspecified: Secondary | ICD-10-CM

## 2013-05-01 MED ORDER — CEFTRIAXONE SODIUM 1 G IJ SOLR
1.0000 g | INTRAMUSCULAR | Status: DC
  Administered 2013-05-01: 1 g via INTRAMUSCULAR

## 2013-05-01 NOTE — Progress Notes (Signed)
Pre visit review using our clinic review tool, if applicable. No additional management support is needed unless otherwise documented below in the visit note. 

## 2013-05-01 NOTE — Patient Instructions (Addendum)
It was good to see you today.  Rocephin antibiotic injection given to you today  Continue salt water gargle, Tylenol as needed  Call if symptoms unimproved in next 72 hours, sooner if worse  Viral and Bacterial Pharyngitis Pharyngitis is soreness (inflammation) or infection of the pharynx. It is also called a sore throat. CAUSES  Most sore throats are caused by viruses and are part of a cold. However, some sore throats are caused by strep and other bacteria. Sore throats can also be caused by post nasal drip from draining sinuses, allergies and sometimes from sleeping with an open mouth. Infectious sore throats can be spread from person to person by coughing, sneezing and sharing cups or eating utensils. TREATMENT  Sore throats that are viral usually last 3-4 days. Viral illness will get better without medications (antibiotics). Strep throat and other bacterial infections will usually begin to get better about 24-48 hours after you begin to take antibiotics. HOME CARE INSTRUCTIONS   If the caregiver feels there is a bacterial infection or if there is a positive strep test, they will prescribe an antibiotic. The full course of antibiotics must be taken. If the full course of antibiotic is not taken, you or your child may become ill again. If you or your child has strep throat and do not finish all of the medication, serious heart or kidney diseases may develop.  Drink enough water and fluids to keep your urine clear or pale yellow.  Only take over-the-counter or prescription medicines for pain, discomfort or fever as directed by your caregiver.  Get lots of rest.  Gargle with salt water ( tsp. of salt in a glass of water) as often as every 1-2 hours as you need for comfort.  Hard candies may soothe the throat if individual is not at risk for choking. Throat sprays or lozenges may also be used. SEEK MEDICAL CARE IF:   Large, tender lumps in the neck develop.  A rash develops.  Green,  yellow-brown or bloody sputum is coughed up.  Your baby is older than 3 months with a rectal temperature of 100.5 F (38.1 C) or higher for more than 1 day. SEEK IMMEDIATE MEDICAL CARE IF:   A stiff neck develops.  You or your child are drooling or unable to swallow liquids.  You or your child are vomiting, unable to keep medications or liquids down.  You or your child has severe pain, unrelieved with recommended medications.  You or your child are having difficulty breathing (not due to stuffy nose).  You or your child are unable to fully open your mouth.  You or your child develop redness, swelling, or severe pain anywhere on the neck.  You have a fever.  Your baby is older than 3 months with a rectal temperature of 102 F (38.9 C) or higher.  Your baby is 28 months old or younger with a rectal temperature of 100.4 F (38 C) or higher. MAKE SURE YOU:   Understand these instructions.  Will watch your condition.  Will get help right away if you are not doing well or get worse. Document Released: 05/21/2005 Document Revised: 08/13/2011 Document Reviewed: 08/18/2007 [facility] Patient Information 2014 [place], [place].

## 2013-05-01 NOTE — Progress Notes (Signed)
   Subjective:    Patient ID: Marilyn [name], female    DOB: December 19, 1965, 46 y.o.   MRN: [id number]  Sore Throat  This is a new problem. The current episode started yesterday. The problem has been gradually worsening. The maximum temperature recorded prior to her arrival was 100 - 100.9 F. The pain is moderate. Associated symptoms include ear pain and a plugged ear sensation. Pertinent negatives include no coughing, diarrhea, ear discharge, headaches, hoarse voice, neck pain, shortness of breath, stridor, swollen glands or vomiting. Trouble swallowing: "painful" She has had exposure to strep. She has tried cool liquids, gargles and NSAIDs for the symptoms. The treatment provided mild relief.    Past Medical History  Diagnosis Date  . Hyperlipidemia   . HYPOTHYROIDISM 01/30/2007  . 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/2007  . ASTHMA 01/30/2007  . GERD 01/31/2007  . Hiatal hernia   . 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 Hemorrhoids   . HTN (hypertension) 09/09/2011    Review of Systems  HENT: Positive for ear pain. Negative for ear discharge and hoarse voice. Trouble swallowing: "painful"   Respiratory: Negative for cough, shortness of breath and stridor.   Gastrointestinal: Negative for vomiting and diarrhea.  Musculoskeletal: Negative for neck pain.  Neurological: Negative for headaches.       Objective:   Physical Exam BP 130/84  Pulse 101  Temp(Src) 97.2 F (36.2 C) (Oral)  Ht 5' 2.5" (1.588 m)  Wt 199 lb 8 oz (90.493 kg)  BMI 35.89 kg/m2  SpO2 96% Wt Readings from Last 3 Encounters:  05/01/13 199 lb 8 oz (90.493 kg)  03/17/13 198 lb (89.812 kg)  12/19/12 201 lb 3.2 oz (91.264 kg)   Constitutional: She appears well-developed and well-nourished. No distress.  HENT: Head: Normocephalic and atraumatic -sinus nontender to palpation. Ears: B TMs ok, no erythema or effusion; Nose: Nose normal. Mouth/Throat: Oropharynx right erythema with bilateral exudate.  Eyes: Conjunctivae and EOM are  normal. Pupils are equal, round, and reactive to light. No scleral icterus.  Neck: Normal range of motion. Neck supple. +LAD. No JVD present. No thyromegaly present.  Cardiovascular: Normal rate, regular rhythm and normal heart sounds.  No murmur heard. No BLE edema. Pulmonary/Chest: Effort normal and breath sounds normal. No respiratory distress. She has no wheezes.  Skin: Skin is warm and dry. No rash noted. No erythema.  Psychiatric: She has a normal mood and affect. Her behavior is normal. Judgment and thought content normal.   Lab Results  Component Value Date   WBC 5.3 03/17/2013   HGB 11.7 03/17/2013   HCT 35.8 03/17/2013   PLT 239 03/17/2013   GLUCOSE 109 03/17/2013   CHOL 221* 12/15/2012   TRIG 90.0 12/15/2012   HDL 44.50 12/15/2012   LDLDIRECT 165.3 12/15/2012   ALT 18 03/17/2013   AST 20 03/17/2013   NA 140 03/17/2013   K 3.8 03/17/2013   CL 104 08/12/2012   CREATININE 0.8 03/17/2013   BUN 16.1 03/17/2013   CO2 26 03/17/2013   TSH 0.19* 12/15/2012        Assessment & Plan:   Acute pharyngitis - high risk strep given exposure to family/house hold contact with same  IM Rocephin today  Symptomatic care advised  Patient cough unimproved in next 72 hours, sooner if worse

## 2013-05-12 ENCOUNTER — Other Ambulatory Visit: Payer: Self-pay | Admitting: Obstetrics and Gynecology

## 2013-05-20 ENCOUNTER — Other Ambulatory Visit: Payer: Self-pay | Admitting: Obstetrics and Gynecology

## 2013-05-20 DIAGNOSIS — R928 Other abnormal and inconclusive findings on diagnostic imaging of breast: Secondary | ICD-10-CM

## 2013-05-27 ENCOUNTER — Ambulatory Visit
Admission: RE | Admit: 2013-05-27 | Discharge: 2013-05-27 | Disposition: A | Discharge status: Home / Self Care | Payer: BC Managed Care – PPO | Source: Ambulatory Visit | Attending: Obstetrics and Gynecology | Admitting: Obstetrics and Gynecology

## 2013-05-27 DIAGNOSIS — R928 Other abnormal and inconclusive findings on diagnostic imaging of breast: Secondary | ICD-10-CM

## 2013-06-26 ENCOUNTER — Telehealth: Payer: Self-pay | Admitting: Internal Medicine

## 2013-06-26 NOTE — Telephone Encounter (Signed)
She said she did them last week at Dr. Antony Haste Ross's office.  She got the results today.  Do you want me to call that office to see if they will fax [place] the labs?

## 2013-06-26 NOTE — Telephone Encounter (Signed)
i see no labs done today on the system  abnormals from oct 2014 can be d/w MD at [facility] I am sure

## 2013-06-26 NOTE — Telephone Encounter (Signed)
A copy of the labs was faxed from [facility].  I will leave them for Dr. Elease Hashimoto for tomorrow.

## 2013-06-26 NOTE — Telephone Encounter (Signed)
Yes, to fax the labs, and keep for the MD to review at the [facility] clinic when pt arrives

## 2013-06-26 NOTE — Telephone Encounter (Signed)
She is on the schedule with Dr. Elease Hashimoto tomorrow.

## 2013-06-26 NOTE — Telephone Encounter (Signed)
Pt went to Dr. Harle Battiest her OBGYN and had labs.  Several labs were abnormal.  They told her to call us and that we can pull that up on the system.  She is having stomach pain for 2 days. Will put on Sat. For the stomach pain.  What to do about the labs?  She was told to go to the pharmacy for OTC vit d. Will be able to answer the phone after 2:45.

## 2013-06-27 ENCOUNTER — Ambulatory Visit: Payer: BC Managed Care – PPO | Admitting: Family Medicine

## 2013-07-02 ENCOUNTER — Telehealth: Payer: Self-pay | Admitting: Internal Medicine

## 2013-07-02 NOTE — Telephone Encounter (Signed)
Patient is calling requesting to speak with someone about her most recent labs which she says were sent to our office. Says that she has questions about them. See previous encounter. Please advise.

## 2013-07-02 NOTE — Telephone Encounter (Signed)
Called the patient back and she is referring to labs from her OBGYN.  Stated she would call her OBGYN and have them fax results to PCP.

## 2013-07-12 ENCOUNTER — Ambulatory Visit (INDEPENDENT_AMBULATORY_CARE_PROVIDER_SITE_OTHER): Payer: BC Managed Care – PPO | Admitting: Internal Medicine

## 2013-07-12 ENCOUNTER — Ambulatory Visit: Payer: BC Managed Care – PPO

## 2013-07-12 VITALS — BP 142/86 | HR 95 | Temp 98.2°F | Resp 18 | Ht 62.5 in | Wt 196.0 lb

## 2013-07-12 DIAGNOSIS — R1032 Left lower quadrant pain: Secondary | ICD-10-CM

## 2013-07-12 LAB — POCT CBC
Granulocyte percent: 45.1 %G (ref 37–80)
HCT, POC: 43.3 % (ref 37.7–47.9)
Hemoglobin: 13.4 g/dL (ref 12.2–16.2)
LYMPH, POC: 2.3 (ref 0.6–3.4)
MCH: 26.6 pg — AB (ref 27–31.2)
MCHC: 30.9 g/dL — AB (ref 31.8–35.4)
MCV: 86 fL (ref 80–97)
MID (CBC): 0.4 (ref 0–0.9)
MPV: 8.2 fL (ref 0–99.8)
PLATELET COUNT, POC: 257 10*3/uL (ref 142–424)
POC Granulocyte: 2.2 (ref 2–6.9)
POC LYMPH PERCENT: 47.7 %L (ref 10–50)
POC MID %: 7.2 % (ref 0–12)
RBC: 5.03 M/uL (ref 4.04–5.48)
RDW, POC: 13.8 %
WBC: 4.9 10*3/uL (ref 4.6–10.2)

## 2013-07-12 LAB — POCT URINALYSIS DIPSTICK
Bilirubin, UA: NEGATIVE
Glucose, UA: NEGATIVE
Leukocytes, UA: NEGATIVE
Nitrite, UA: NEGATIVE
PROTEIN UA: NEGATIVE
RBC UA: NEGATIVE
SPEC GRAV UA: 1.015
UROBILINOGEN UA: 0.2
pH, UA: 7

## 2013-07-12 LAB — POCT UA - MICROSCOPIC ONLY
Bacteria, U Microscopic: NEGATIVE
CRYSTALS, UR, HPF, POC: NEGATIVE
Casts, Ur, LPF, POC: NEGATIVE
Mucus, UA: NEGATIVE
RBC, URINE, MICROSCOPIC: NEGATIVE
WBC, Ur, HPF, POC: NEGATIVE
YEAST UA: NEGATIVE

## 2013-07-12 LAB — POCT SEDIMENTATION RATE: POCT SED RATE: 65 mm/hr — AB (ref 0–22)

## 2013-07-12 MED ORDER — POLYETHYLENE GLYCOL 3350 17 GM/SCOOP PO POWD: ORAL | Status: DC

## 2013-07-12 MED ORDER — DICYCLOMINE HCL 20 MG PO TABS: 20.0000 mg | ORAL_TABLET | Freq: Three times a day (TID) | ORAL | Status: DC

## 2013-07-12 NOTE — Progress Notes (Addendum)
Subjective:   This chart was scribed for Tami Lin, MD by Forrestine Him, [facility] Scribe. This patient was seen in room  and the patient's care was started 10:35 AM.    Patient ID: Marilyn [name], female    DOB: 01/23/1966, 48 y.o.   MRN: [id number]  HPI  HPI Comments: Marilyn [name] is a 48 y.o. female who presents to [facility] complaining of intermittent RLQ and LLQ abdominal pain described as "pressure" and rated 5/10 and 8/10 at its worst that initially started 2 weeks ago. She admits to radiating pain into her back with her abdominal pain. Denies any sharp pain at this time. Pt states her abdominal pain is exacerbated by movement, and denies any alleviating factors at this time. She reports constipation at time of initial onset that seemed to respond to one dose of milk of magnesia . However, she reports new changes in her bowel movements as her stools have recently been loose and formed. She reports a history of black stools, but has not had any associated with this current pain. However, she admits to 1 episode of white chalky stools that occurred about 2 weeks ago. Denies nausea, vomiting, fever, chills, diaphoresis, or frequency. Denies any bloody stools. Her LMP was May 2014, and she states her OB/GYN feels she may be going into menopause. Appetite is okay. There are no postprandial symptoms. She has a history of GERD. She has had 2 colonoscopies. Abdominal pains have been evaluated on more than one occasion over the last 8 years as is evidenced by the chart with numerous studies.  She's also had episodes of chest pain that were evaluated with no obvious cause. She is frustrated that no one can find the etiology of all her various pains.  Pt admits to weight gain over the last year, and a decrease in activity level.  Abdominal Ultrasound 2013 within normal limits Colonoscopy 2012 within normal limits- Dr. Fuller Plan Pt is status post  hysterectomy CT angio chest 7/13  Patient Active Problem List   Diagnosis Date Noted  . Cough 12/20/2012  . Hypersomnolence 12/15/2012  . DOE (dyspnea on exertion) 12/15/2012  . Right shoulder pain 12/15/2012  . Abdominal pain, RUQ 11/15/2011  . HTN (hypertension) 09/09/2011  . Neck pain 09/09/2011  . Preventative health care 04/30/2011  . HOT FLASHES 12/22/2009  . CHEST PAIN 01/29/2009  . HYPERTHYROIDISM status treated  01/31/2007  . GERD 01/31/2007  . HYPOTHYROIDISM-status post thyroidectomy  01/30/2007  . HYPERLIPIDEMIA 01/30/2007  . ANEMIA-IRON DEFICIENCY----resolved with treatment  01/30/2007  . ASTHMA 01/30/2007    -  Obesity  Current outpatient prescriptions:albuterol (VENTOLIN HFA) 108 (90 BASE) MCG/ACT inhaler, Inhale 2 puffs into the lungs every 4 (four) hours as needed. For shortness of breath. amLODipine (NORVASC) 10 MG tablet, Take 1 tablet (10 mg total) by mouth daily., Disp: 90 tablet, Rfl: 3 benzonatate (TESSALON) 200 MG capsule, Take 1 capsule (200 mg total) by mouth 3 (three) times daily as needed for cough., Disp: 30 capsule, Rfl: 1;   levothyroxine (SYNTHROID, LEVOTHROID) 125 MCG tablet, Take 1 tablet (125 mcg total) by mouth daily., Disp: 90 tablet, vitamin B-12 (CYANOCOBALAMIN) 1000 MCG tablet, Take 1,000 mcg by mouth daily., Disp: , Rfl:     Review of Systems  Constitutional: Negative for fever, chills and diaphoresis.  HENT: Negative for trouble swallowing.   Eyes: Negative for visual disturbance.  Respiratory: Negative for apnea and choking.  She describes some general shortness of breath with activity like walking to work for walking upstairs. She is relatively sedentary and has done little exercise since 6 months ago. She has a history of asthma but describes no recent wheezing and is not currently on meds. She has no palpitations or chest pain with activity. She has been evaluated for shortness of breath in the past and this information is in the  chart.  Cardiovascular: Negative for palpitations and leg swelling.  Gastrointestinal: Positive for abdominal pain and constipation. Negative for vomiting, blood in stool and rectal pain.  Endocrine: Negative for polydipsia, polyphagia and polyuria.  Genitourinary: Negative for dysuria, frequency and difficulty urinating.  Musculoskeletal: Positive for back pain (this is not a chronic problem and has been present this past week. There is no known injury).  Skin: Negative for rash.     Past Surgical History  Procedure Laterality Date  . Abdominal hysterectomy    . Tubal ligation  1987  . Broken jaw    . Breast reduction surgery  1986  . Carpal tunnel release  2001  . Thyroidectomy      Objective:   Physical Exam  Nursing note and vitals reviewed. Constitutional: She is oriented to person, place, and time. She appears well-developed and well-nourished.  HENT:  Head: Normocephalic and atraumatic.  Eyes: EOM are normal.  Neck: Normal range of motion.  Cardiovascular: Normal rate.   Pulmonary/Chest: Effort normal.  Abdominal: There is tenderness.  Diffuse tenderness to abdomen  Musculoskeletal: Normal range of motion. She exhibits tenderness.  Neurological: She is alert and oriented to person, place, and time.  Skin: Skin is warm and dry.  Psychiatric: She has a normal mood and affect. Her behavior is normal.  [facility] reading (PRIMARY) by  Dr. Azuree Minish=NAD  Results for orders placed in visit on 07/12/13  POCT CBC      Result Value Range   WBC 4.9  4.6 - 10.2 K/uL   Lymph, poc 2.3  0.6 - 3.4   POC LYMPH PERCENT 47.7  10 - 50 %L   MID (cbc) 0.4  0 - 0.9   POC MID % 7.2  0 - 12 %M   POC Granulocyte 2.2  2 - 6.9   Granulocyte percent 45.1  37 - 80 %G   RBC 5.03  4.04 - 5.48 M/uL   Hemoglobin 13.4  12.2 - 16.2 g/dL   HCT, POC 43.3  37.7 - 47.9 %   MCV 86.0  80 - 97 fL   MCH, POC 26.6 (*) 27 - 31.2 pg   MCHC 30.9 (*) 31.8 - 35.4 g/dL   RDW, POC 13.8     Platelet Count, POC 257   142 - 424 K/uL   MPV 8.2  0 - 99.8 fL  POCT URINALYSIS DIPSTICK      Result Value Range   Color, UA yellow     Clarity, UA clear     Glucose, UA neg     Bilirubin, UA neg     Ketones, UA eng     Spec Grav, UA 1.015     Blood, UA neg     pH, UA 7.0     Protein, UA neg     Urobilinogen, UA 0.2     Nitrite, UA neg     Leukocytes, UA Negative    POCT UA - MICROSCOPIC ONLY      Result Value Range   WBC, Ur, HPF, POC neg  RBC, urine, microscopic neg     Bacteria, U Microscopic neg     Mucus, UA neg     Epithelial cells, urine per micros 0-2     Crystals, Ur, HPF, POC neg     Casts, Ur, LPF, POC neg     Yeast, UA neg     Sedimentation rate equaled 65  Assessment & Plan:  I have completed the patient encounter in its entirety as documented by the scribe, with editing by me where necessary. Severo Beber P. Laney Pastor, M.D. 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 polyethylene glycol powder (GLYCOLAX/MIRALAX) powder    Sig: 17g by mouth 3 times a day for 2 days, then twice a day for 2 days then daily for 10 days    Dispense:  3350 g    Refill:  1  . dicyclomine (BENTYL) 20 MG tablet    Sig: Take 1 tablet (20 mg total) by mouth 4 (four) times daily -  before meals and at bedtime.    Dispense:  28 tablet    Refill:  0  ESR 65--?why--this suggests that we need to look harder for underlying illness Inflammatory bowel disease seems to have been excluded her colonoscopy and scans in recent years There is no obvious current infection such as diverticulosis She has no rheumatic condition that has been identified Pulmonary scans should have ruled out sarcoidosis She has no other obvious signs of mixed connective tissue disease  F/u 1-2  weeks c PCP Followup here for any acute symptoms in the interim Repeat sedimentation rate one month

## 2013-07-21 ENCOUNTER — Telehealth: Payer: Self-pay

## 2013-07-21 DIAGNOSIS — G8929 Other chronic pain: Secondary | ICD-10-CM

## 2013-07-21 DIAGNOSIS — R109 Unspecified abdominal pain: Principal | ICD-10-CM

## 2013-07-21 DIAGNOSIS — R700 Elevated erythrocyte sedimentation rate: Secondary | ICD-10-CM

## 2013-07-21 NOTE — Telephone Encounter (Signed)
PT STATES DR DOOLITTLE TOLD HER IF HER STOMACH WAS STILL BOTHERING HER, HE WOULD SEND HER TO DR Fuller Plan AGAIN PLEASE CALL [phone]

## 2013-07-24 NOTE — Telephone Encounter (Signed)
she is absolutely correct---I will send order--let her know---we can see her again before that appt if needed for further meds/further testing

## 2013-07-24 NOTE — Telephone Encounter (Signed)
As per last office note she has not improved, has continued abdominal pain, and has the elevated sed rate without other abnormal labs. We will set up a referral to Dr. Fuller Plan who has seen her before.

## 2013-07-24 NOTE — Telephone Encounter (Signed)
Pt.notified

## 2013-08-24 ENCOUNTER — Telehealth: Payer: Self-pay | Admitting: *Deleted

## 2013-08-24 ENCOUNTER — Encounter: Payer: Self-pay | Admitting: Internal Medicine

## 2013-08-24 ENCOUNTER — Ambulatory Visit (INDEPENDENT_AMBULATORY_CARE_PROVIDER_SITE_OTHER): Payer: BC Managed Care – PPO | Admitting: Internal Medicine

## 2013-08-24 VITALS — BP 148/94 | HR 101 | Temp 98.2°F | Wt 201.4 lb

## 2013-08-24 DIAGNOSIS — J069 Acute upper respiratory infection, unspecified: Secondary | ICD-10-CM

## 2013-08-24 MED ORDER — AMOXICILLIN 875 MG PO TABS: 875.0000 mg | ORAL_TABLET | Freq: Two times a day (BID) | ORAL | Status: DC

## 2013-08-24 NOTE — Patient Instructions (Signed)
Upper respiratory infection with tenderness at the sinuses, sore throat and feeling bad.  Plan Amoxicillin 875 mg twice a sday for 7 days  Sudafed (generic) 30 mg 2 -3 times a day for sinus congestion and pressure in the ears  Tylenol 500 mg  1 or 2 three times a day for fever and pain  Gargle of choice   Hydrate, rest.  Call for fever, inability to swallow, shortness of breath.   Upper Respiratory Infection, Adult An upper respiratory infection (URI) is also sometimes known as the common cold. The upper respiratory tract includes the nose, sinuses, throat, trachea, and bronchi. Bronchi are the airways leading to the lungs. Most people improve within 1 week, but symptoms can last up to 2 weeks. A residual cough may last even longer.  CAUSES Many different viruses can infect the tissues lining the upper respiratory tract. The tissues become irritated and inflamed and often become very moist. Mucus production is also common. A cold is contagious. You can easily spread the virus to others by oral contact. This includes kissing, sharing a glass, coughing, or sneezing. Touching your mouth or nose and then touching a surface, which is then touched by another person, can also spread the virus. SYMPTOMS  Symptoms typically develop 1 to 3 days after you come in contact with a cold virus. Symptoms vary from person to person. They may include:  Runny nose.  Sneezing.  Nasal congestion.  Sinus irritation.  Sore throat.  Loss of voice (laryngitis).  Cough.  Fatigue.  Muscle aches.  Loss of appetite.  Headache.  Low-grade fever. DIAGNOSIS  You might diagnose your own cold based on familiar symptoms, since most people get a cold 2 to 3 times a year. Your caregiver can confirm this based on your exam. Most importantly, your caregiver can check that your symptoms are not due to another disease such as strep throat, sinusitis, pneumonia, asthma, or epiglottitis. Blood tests, throat tests, and  X-rays are not necessary to diagnose a common cold, but they may sometimes be helpful in excluding other more serious diseases. Your caregiver will decide if any further tests are required. RISKS AND COMPLICATIONS  You may be at risk for a more severe case of the common cold if you smoke cigarettes, have chronic heart disease (such as heart failure) or lung disease (such as asthma), or if you have a weakened immune system. The very young and very old are also at risk for more serious infections. Bacterial sinusitis, middle ear infections, and bacterial pneumonia can complicate the common cold. The common cold can worsen asthma and chronic obstructive pulmonary disease (COPD). Sometimes, these complications can require emergency medical care and may be life-threatening. PREVENTION  The best way to protect against getting a cold is to practice good hygiene. Avoid oral or hand contact with people with cold symptoms. Wash your hands often if contact occurs. There is no clear evidence that vitamin C, vitamin E, echinacea, or exercise reduces the chance of developing a cold. However, it is always recommended to get plenty of rest and practice good nutrition. TREATMENT  Treatment is directed at relieving symptoms. There is no cure. Antibiotics are not effective, because the infection is caused by a virus, not by bacteria. Treatment may include:  Increased fluid intake. Sports drinks offer valuable electrolytes, sugars, and fluids.  Breathing heated mist or steam (vaporizer or shower).  Eating chicken soup or other clear broths, and maintaining good nutrition.  Getting plenty of rest.  Using  gargles or lozenges for comfort.  Controlling fevers with ibuprofen or acetaminophen as directed by your caregiver.  Increasing usage of your inhaler if you have asthma. Zinc gel and zinc lozenges, taken in the first 24 hours of the common cold, can shorten the duration and lessen the severity of symptoms. Pain  medicines may help with fever, muscle aches, and throat pain. A variety of non-prescription medicines are available to treat congestion and runny nose. Your caregiver can make recommendations and may suggest nasal or lung inhalers for other symptoms.  HOME CARE INSTRUCTIONS   Only take over-the-counter or prescription medicines for pain, discomfort, or fever as directed by your caregiver.  Use a warm mist humidifier or inhale steam from a shower to increase air moisture. This may keep secretions moist and make it easier to breathe.  Drink enough water and fluids to keep your urine clear or pale yellow.  Rest as needed.  Return to work when your temperature has returned to normal or as your caregiver advises. You may need to stay home longer to avoid infecting others. You can also use a face mask and careful hand washing to prevent spread of the virus. SEEK MEDICAL CARE IF:   After the first few days, you feel you are getting worse rather than better.  You need your caregiver's advice about medicines to control symptoms.  You develop chills, worsening shortness of breath, or brown or red sputum. These may be signs of pneumonia.  You develop yellow or brown nasal discharge or pain in the face, especially when you bend forward. These may be signs of sinusitis.  You develop a fever, swollen neck glands, pain with swallowing, or white areas in the back of your throat. These may be signs of strep throat. SEEK IMMEDIATE MEDICAL CARE IF:   You have a fever.  You develop severe or persistent headache, ear pain, sinus pain, or chest pain.  You develop wheezing, a prolonged cough, cough up blood, or have a change in your usual mucus (if you have chronic lung disease).  You develop sore muscles or a stiff neck. Document Released: 11/14/2000 Document Revised: 08/13/2011 Document Reviewed: 09/22/2010 [facility] Patient Information 2014 [place], [place].

## 2013-08-24 NOTE — Progress Notes (Signed)
Pre visit review using our clinic review tool, if applicable. No additional management support is needed unless otherwise documented below in the visit note. 

## 2013-08-24 NOTE — Progress Notes (Signed)
Subjective:    Patient ID: Marilyn [name], female    DOB: [birth year], 48 y.o.   MRN: [id number]  HPI Marilyn [name], a patient of Dr. Jenny Reichmann, has had a headache for a week located over the right eye. The pain will be intermittent that was relieved by aleve. For 24 hours she has had a very sore throat: she can swallow but it is hard, no fever, no chills, no enlarged nodes. She has had a cough with blood streaks. This was a sudden on-set problem with a sense of feeling generally sick. No vomiting.  Past Medical History  Diagnosis Date  . Hyperlipidemia   . HYPOTHYROIDISM 01/30/2007  . 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/2007  . ASTHMA 01/30/2007  . GERD 01/31/2007  . Hiatal hernia   . [place] tear 04/2003  . Hemorrhoids   . HTN (hypertension) 09/09/2011   Past Surgical History  Procedure Laterality Date  . Abdominal hysterectomy    . Tubal ligation  1987  . Broken jaw    . Breast reduction surgery  1986  . Carpal tunnel release  2001  . Thyroidectomy     Family History  Problem Relation Age of Onset  . Hypertension Father   . Diabetes Father   . Breast cancer Sister   . Colon cancer Neg Hx   . Diabetes Maternal Grandmother   . Heart disease Maternal Grandmother   . Asthma Mother   . Asthma Maternal Grandfather    History   Social History  . Marital Status: Single    Spouse Name: N/A    Number of Children: 3  . Years of Education: N/A   Occupational History  . [employer]    Social History Main Topics  . Smoking status: Former Smoker -- 0.25 packs/day for 3 years    Types: Cigarettes    Quit date: 06/04/1980  . Smokeless tobacco: Never Used  . Alcohol Use: No  . Drug Use: No  . Sexual Activity: Not on file   Other Topics Concern  . Not on file   Social History Narrative   Married    Current Outpatient Prescriptions on File Prior to Visit  Medication Sig Dispense Refill  . albuterol (VENTOLIN HFA) 108 (90 BASE) MCG/ACT inhaler Inhale 2 puffs into the lungs  every 4 (four) hours as needed. For shortness of breath.  1 Inhaler  11  . amLODipine (NORVASC) 10 MG tablet Take 1 tablet (10 mg total) by mouth daily.  90 tablet  3  . benzonatate (TESSALON) 200 MG capsule Take 1 capsule (200 mg total) by mouth 3 (three) times daily as needed for cough.  30 capsule  1  . dicyclomine (BENTYL) 20 MG tablet Take 1 tablet (20 mg total) by mouth 4 (four) times daily -  before meals and at bedtime.  28 tablet  0  . levothyroxine (SYNTHROID, LEVOTHROID) 125 MCG tablet Take 1 tablet (125 mcg total) by mouth daily.  90 tablet  3  . polyethylene glycol powder (GLYCOLAX/MIRALAX) powder 17g by mouth 3 times a day for 2 days, then twice a day for 2 days then daily for 10 days  3350 g  1  . vitamin B-12 (CYANOCOBALAMIN) 1000 MCG tablet Take 1,000 mcg by mouth daily.       Current Facility-Administered Medications on File Prior to Visit  Medication Dose Route Frequency Provider Last Rate Last Dose  . cefTRIAXone (ROCEPHIN) injection 1 g  1 g Intramuscular Q24H Rowe Clack, MD  1 g at 05/01/13 1603      Review of Systems System review is negative for any constitutional, cardiac, pulmonary, GI or neuro symptoms or complaints other than as described in the HPI.     Objective:   Physical Exam Filed Vitals:   08/24/13 1141  BP: 148/94  Pulse: 101  Temp: 98.2 F (36.8 C)   Wt Readings from Last 3 Encounters:  08/24/13 201 lb 6.4 oz (91.354 kg)  07/12/13 196 lb (88.905 kg)  05/01/13 199 lb 8 oz (90.493 kg)   BP Readings from Last 3 Encounters:  08/24/13 148/94  07/12/13 142/86  05/01/13 130/84   Gen'l- Overweight woman in no distress HEENT- TM's are normal, moderate tenderness to percussion over the facial sinuses, throat w/o erythema, exudate or swelling. Nodes - negative submandibular and cervical regions Cor- RRR Pulm Clear to percussion and auscultation Neuor - A&O x 3       Assessment & Plan:  Upper respiratory infection with tenderness at  the sinuses, sore throat and feeling bad.  Plan Amoxicillin 875 mg twice a sday for 7 days  Sudafed (generic) 30 mg 2 -3 times a day for sinus congestion and pressure in the ears  Tylenol 500 mg  1 or 2 three times a day for fever and pain  Gargle of choice   Hydrate, rest.  Call for fever, inability to swallow, shortness of breath.

## 2013-08-24 NOTE — Telephone Encounter (Signed)
[facility] Triage Call Report Triage Record Num: [id number] Operator: Nancie Neas Patient Name: Marilyn [name] Call Date & Time: 08/24/2013 7:30:47AM Patient Phone: [phone] PCP: [name] Patient Gender: Female PCP Fax : [phone] Patient DOB: 11/04/1965 Practice Name: [facility] MRN: [place] Reason for Call: Caller: [name]/Patient; PCP: [name] (Adults only); CB#: [phone]; LMP 10/2012 - Today, 08/24/2013, pt calling with c/o sore throat since 08/23/2013 PM along with hacky cough. Right side of throat looks swollen. No trouble breathing. NO fever. RN reached See Provider within 24 hours for enlarged tonsil per Sore throat or Hoarseness protocol. Care advice given and appt scheduled with Dr Linda Hedges at 1130 - no appt with PCP/ Dr Jenny Reichmann . RN advised OTC Tylenol or Motrin per package instructions as well. Protocol(s) Used: Sore Throat or Hoarseness Recommended Outcome per Protocol: See Provider within 24 hours Reason for Outcome: Has enlarged tonsils covered by yellow-white patches Care Advice: Drink more fluids -- water, low-sugar juices, tea and warm soup, especially chicken broth, are options. Avoid caffeinated or alcoholic beverages because they can increase the chance of dehydration. ~ ~ SYMPTOM / CONDITION MANAGEMENT Analgesic/Antipyretic Advice - Acetaminophen: Consider acetaminophen as directed on label or by pharmacist/provider for pain or fever PRECAUTIONS: - Use if there is no history of liver disease, alcoholism, or intake of three or more alcohol drinks per day - Only if approved by provider during pregnancy or when breastfeeding - During pregnancy, acetaminophen should not be taken more than 3 consecutive days without telling provider - Do not exceed recommended dose or frequency ~ ~ Call 911 if voice muffled, is unable to swallow own saliva and is drooling or choking sensation. ~ Rest until symptoms improve. If more than [redacted] weeks pregnant, lie on left  side when resting. Analgesic/Antipyretic Advice - NSAIDs: Consider aspirin, ibuprofen, naproxen or ketoprofen for pain or fever as directed on label or by pharmacist/provider. PRECAUTIONS: - If over 64 years of age, should not take longer than 1 week without consulting provider. EXCEPTIONS: - Should not be used if taking blood thinners or have bleeding problems. - Do not use if have history of sensitivity/allergy to any of these medications; or history of cardiovascular, ulcer, kidney, liver disease or diabetes unless approved by provider. - Do not exceed recommended dose or frequency. ~ Sore Throat Relief: - Use salt water gargles (1/2 teaspoon salt in 8 oz. [278mL] warm water) every one to two hours. - Use a vaporizer or cool mist humidifier in the room when sleeping. - Suck on hard candy, nonprescription or herbal throat lozenges (sugar-free if diabetic) - Eat soothing, soft food/fluids (broths, soups, or honey and lemon juice in hot tea, Popsicles, frozen yogurt or sherbet, scrambled eggs, cooked cereals, Jell-O or puddings) whichever is most comforting. - Avoid eating salty, spicy or acidic foods. ~ 03

## 2013-08-24 NOTE — Telephone Encounter (Signed)
[facility] Triage Call Report Triage Record Num: [id number] Operator: Nancie Neas Patient Name: Marilyn [name] Call Date & Time: 08/24/2013 7:30:47AM Patient Phone: [phone] PCP: [name] Patient Gender: Female PCP Fax : [phone] Patient DOB: Dec 09, 1965 [facility] Name[facility] MRN: [place] Reason for Call: Caller: [place]/Patient; PCP: [name] (Adults only); CB#: [phone]; LMP 10/2012 - Today, 08/24/2013, pt calling with c/o sore throat since 08/23/2013 PM along with hacky cough. Right side of throat looks swollen. No trouble breathing. NO fever. RN reached See Provider within 24 hours for enlarged tonsil per Sore throat or Hoarseness protocol. Care advice given and appt scheduled with Dr Linda Hedges at 1130 - no appt with PCP/ Dr Jenny Reichmann . RN advised OTC Tylenol or Motrin per package instructions as well. Protocol(s) Used: Sore Throat or Hoarseness Recommended Outcome per Protocol: See Provider within 24 hours Reason for Outcome: Has enlarged tonsils covered by yellow-white patches Care Advice: Drink more fluids -- water, low-sugar juices, tea and warm soup, especially chicken broth, are options. Avoid caffeinated or alcoholic beverages because they can increase the chance of dehydration. ~ ~ SYMPTOM / CONDITION MANAGEMENT Analgesic/Antipyretic Advice - Acetaminophen: Consider acetaminophen as directed on label or by pharmacist/provider for pain or fever PRECAUTIONS: - Use if there is no history of liver disease, alcoholism, or intake of three or more alcohol drinks per day - Only if approved by provider during pregnancy or when breastfeeding - During pregnancy, acetaminophen should not be taken more than 3 consecutive days without telling provider - Do not exceed recommended dose or frequency ~ ~ Call 911 if voice muffled, is unable to swallow own saliva and is drooling or choking sensation. ~ Rest until symptoms improve. If more than [redacted] weeks pregnant, lie on left  side when resting. Analgesic/Antipyretic Advice - NSAIDs: Consider aspirin, ibuprofen, naproxen or ketoprofen for pain or fever as directed on label or by pharmacist/provider. PRECAUTIONS: - If over 16 years of age, should not take longer than 1 week without consulting provider. EXCEPTIONS: - Should not be used if taking blood thinners or have bleeding problems. - Do not use if have history of sensitivity/allergy to any of these medications; or history of cardiovascular, ulcer, kidney, liver disease or diabetes unless approved by provider. - Do not exceed recommended dose or frequency. ~ Sore Throat Relief: - Use salt water gargles (1/2 teaspoon salt in 8 oz. [272mL] warm water) every one to two hours. - Use a vaporizer or cool mist humidifier in the room when sleeping. - Suck on hard candy, nonprescription or herbal throat lozenges (sugar-free if diabetic) - Eat soothing, soft food/fluids (broths, soups, or honey and lemon juice in hot tea, Popsicles, frozen yogurt or sherbet, scrambled eggs, cooked cereals, Jell-O or puddings) whichever is most comforting. - Avoid eating salty, spicy or acidic foods. ~

## 2013-09-09 ENCOUNTER — Encounter: Payer: Self-pay | Admitting: Internal Medicine

## 2013-09-14 ENCOUNTER — Telehealth: Payer: Self-pay | Admitting: Internal Medicine

## 2013-09-14 NOTE — Telephone Encounter (Signed)
talked to pt and gave her new appt date for 4/30 r/s per MD due to call

## 2013-09-15 ENCOUNTER — Ambulatory Visit: Payer: BC Managed Care – PPO

## 2013-09-15 ENCOUNTER — Other Ambulatory Visit: Payer: BC Managed Care – PPO

## 2013-10-01 ENCOUNTER — Telehealth: Payer: Self-pay | Admitting: Internal Medicine

## 2013-10-01 ENCOUNTER — Ambulatory Visit (HOSPITAL_BASED_OUTPATIENT_CLINIC_OR_DEPARTMENT_OTHER): Payer: BC Managed Care – PPO | Admitting: Internal Medicine

## 2013-10-01 ENCOUNTER — Other Ambulatory Visit (HOSPITAL_BASED_OUTPATIENT_CLINIC_OR_DEPARTMENT_OTHER): Payer: BC Managed Care – PPO

## 2013-10-01 ENCOUNTER — Encounter: Payer: Self-pay | Admitting: Internal Medicine

## 2013-10-01 VITALS — BP 129/85 | HR 89 | Temp 98.0°F | Resp 18 | Ht 62.5 in | Wt 201.0 lb

## 2013-10-01 DIAGNOSIS — D500 Iron deficiency anemia secondary to blood loss (chronic): Secondary | ICD-10-CM

## 2013-10-01 DIAGNOSIS — N920 Excessive and frequent menstruation with regular cycle: Secondary | ICD-10-CM

## 2013-10-01 DIAGNOSIS — D509 Iron deficiency anemia, unspecified: Secondary | ICD-10-CM

## 2013-10-01 LAB — CBC WITH DIFFERENTIAL/PLATELET
BASO%: 0.3 % (ref 0.0–2.0)
Basophils Absolute: 0 10*3/uL (ref 0.0–0.1)
EOS%: 1.8 % (ref 0.0–7.0)
Eosinophils Absolute: 0.1 10*3/uL (ref 0.0–0.5)
HCT: 37.8 % (ref 34.8–46.6)
HGB: 12.4 g/dL (ref 11.6–15.9)
LYMPH%: 47.2 % (ref 14.0–49.7)
MCH: 26.8 pg (ref 25.1–34.0)
MCHC: 32.8 g/dL (ref 31.5–36.0)
MCV: 81.7 fL (ref 79.5–101.0)
MONO#: 0.5 10*3/uL (ref 0.1–0.9)
MONO%: 8.4 % (ref 0.0–14.0)
NEUT#: 2.3 10*3/uL (ref 1.5–6.5)
NEUT%: 42.3 % (ref 38.4–76.8)
PLATELETS: 243 10*3/uL (ref 145–400)
RBC: 4.62 10*6/uL (ref 3.70–5.45)
RDW: 14 % (ref 11.2–14.5)
WBC: 5.5 10*3/uL (ref 3.9–10.3)
lymph#: 2.6 10*3/uL (ref 0.9–3.3)

## 2013-10-01 LAB — COMPREHENSIVE METABOLIC PANEL (CC13)
ALK PHOS: 82 U/L (ref 40–150)
ALT: 14 U/L (ref 0–55)
ANION GAP: 9 meq/L (ref 3–11)
AST: 18 U/L (ref 5–34)
Albumin: 3.7 g/dL (ref 3.5–5.0)
BILIRUBIN TOTAL: 0.2 mg/dL (ref 0.20–1.20)
BUN: 13.7 mg/dL (ref 7.0–26.0)
CO2: 27 meq/L (ref 22–29)
Calcium: 9.1 mg/dL (ref 8.4–10.4)
Chloride: 106 mEq/L (ref 98–109)
Creatinine: 0.9 mg/dL (ref 0.6–1.1)
Glucose: 85 mg/dl (ref 70–140)
Potassium: 3.9 mEq/L (ref 3.5–5.1)
Sodium: 142 mEq/L (ref 136–145)
Total Protein: 8.1 g/dL (ref 6.4–8.3)

## 2013-10-01 LAB — IRON AND TIBC CHCC
%SAT: 15 % — AB (ref 21–57)
Iron: 52 ug/dL (ref 41–142)
TIBC: 345 ug/dL (ref 236–444)
UIBC: 293 ug/dL (ref 120–384)

## 2013-10-01 LAB — FERRITIN CHCC: Ferritin: 230 ng/ml (ref 9–269)

## 2013-10-01 NOTE — Telephone Encounter (Signed)
gv pt appt schedule for oct.  °

## 2013-10-01 NOTE — Progress Notes (Signed)
[facility] OFFICE PROGRESS NOTE  [name], MD [place]  [id number]  DIAGNOSIS: ANEMIA-IRON DEFICIENCY - Plan: CBC with Differential, Basic metabolic panel (Bmet) - [facility], Ferritin, Iron and TIBC  Chief Complaint  Patient presents with  . ANEMIA-IRON DEFICIENCY    CURRENT THERAPY:The patient has received Feraheme 510 mg IV on 11/10/2010, 11/17/2010, 08/01/2011 and 08/08/2011.     INTERVAL HISTORY: Marilyn [name] 48 y.o. female with a history of iron-deficiency anemia secondary to excessive menstrual losses (2005) is here for follow-up.  Patient was last seen on 03/17/13 by me.   She feels ok but reports being a little tired.  She had a mammogram on 9 th of December demonstrating a right breast abnormality and had ultrasound on 24 th of December.  Physicians at the breast center recommended a 6 month follow up.  She will have a repeat breast exam including mammogram in June.  She also saw her primary gynecologist (Dr. Harle Battiest ) a few weeks ago and suggested she keep this appointment and instructed her to report any new symptoms. Her last menstrual period was May, 2014.  She does report intermittent spotting.   She reports occasional dyspnea on exertion.  She also denies melena or hematochezia.  Her last colonoscopy/endoscopy was on 12/25/2010 by Dr. Lucio Edward.  She denies any pagophagia.    MEDICAL HISTORY: Past Medical History  Diagnosis Date  . Hyperlipidemia   . HYPOTHYROIDISM 01/30/2007  . 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/2007  . ASTHMA 01/30/2007  . GERD 01/31/2007  . Hiatal hernia   . [place] tear 04/2003  . Hemorrhoids   . HTN (hypertension) 09/09/2011    INTERIM HISTORY: has HYPERTHYROIDISM; HYPOTHYROIDISM; HYPERLIPIDEMIA; ANEMIA-IRON DEFICIENCY; ASTHMA; GERD; HOT FLASHES; CHEST PAIN; Preventative health care; HTN (hypertension); Neck pain; Abdominal pain, RUQ; Hypersomnolence; DOE (dyspnea on exertion); Right shoulder pain; and  Cough on her problem list.    ALLERGIES:  is allergic to codeine.  MEDICATIONS: has a current medication list which includes the following prescription(s): albuterol, amlodipine, dicyclomine, levothyroxine, polyethylene glycol powder, and vitamin b-12, and the following Facility-Administered Medications: ceftriaxone.  SURGICAL HISTORY:  Past Surgical History  Procedure Laterality Date  . Abdominal hysterectomy    . Tubal ligation  1987  . Broken jaw    . Breast reduction surgery  1986  . Carpal tunnel release  2001  . Thyroidectomy     PROBLEM LIST:  1. History of iron-deficiency anemia extending back to at least to May of 2005 felt to be due to excessive menstrual losses. The patient has received intravenous iron on several occasions throughout the years. She has had stools that were negative for occult blood x3 on 08/10/2009 and 12/19/2010. She has undergone endoscopy and colonoscopy by Dr. Lucio Edward on 12/25/2010. The patient has received Feraheme 510 mg IV on 11/10/2010, 11/17/2010, 08/01/2011 and 08/08/2011.  2. History of asthma.  3. Hypertension.  4. Gastroesophageal reflux disease.  5. Irritable bowel syndrome.  6. Hypothyroidism, status post thyroidectomy in 1979.  7. History of episodic shortness of breath and irregular breathing with negative CT angiogram of the chest carried out on 12/17/2011 and negative chest x-ray from 05/15/2012. Symptoms were present going back to July 2013. Naveh has been to the emergency room on more than 1  occasion for this problem.  8. Pain, numbness and paresthesias involving left hand. The possibility of carpal tunnel syndrome needs to be investigated.  REVIEW OF SYSTEMS:   Constitutional:  Denies fevers, chills or abnormal weight loss Eyes: Denies blurriness of vision Ears, nose, mouth, throat, and face: Denies mucositis or sore throat Respiratory: Denies cough, dyspnea or wheezes Cardiovascular: Denies palpitation, chest discomfort or  lower extremity swelling Gastrointestinal:  Denies nausea, heartburn or change in bowel habits Skin: Denies abnormal skin rashes Lymphatics: Denies new lymphadenopathy or easy bruising Neurological:Denies numbness, tingling or new weaknesses Behavioral/Psych: Mood is stable, no new changes  All other systems were reviewed with the patient and are negative.  PHYSICAL EXAMINATION: ECOG PERFORMANCE STATUS: 0 - Asymptomatic  Blood pressure 129/85, pulse 89, temperature 98 F (36.7 C), temperature source Oral, resp. rate 18, height 5' 2.5" (1.588 m), weight 201 lb (91.173 kg).  GENERAL:alert, no distress and comfortable; obese, well-developed and nourished.  SKIN: skin color, texture, turgor are normal, no rashes or significant lesions EYES: normal, Conjunctiva are pink and non-injected, sclera clear OROPHARYNX:no exudate, no erythema and lips, buccal mucosa, and tongue normal  NECK: supple, thyroid normal size, non-tender, without nodularity LYMPH:  no palpable lymphadenopathy in the cervical, axillary or supraclavicular LUNGS: clear to auscultation and percussion with normal breathing effort HEART: regular rate & rhythm and no murmurs and no lower extremity edema ABDOMEN:abdomen soft, non-tender and normal bowel sounds Musculoskeletal:no cyanosis of digits and no clubbing  NEURO: alert & oriented x 3 with fluent speech, no focal motor/sensory deficits   LABORATORY DATA: Results for orders placed in visit on 10/01/13 (from the past 48 hour(s))  CBC WITH DIFFERENTIAL     Status: None   Collection Time    10/01/13  1:38 PM      Result Value Ref Range   WBC 5.5  3.9 - 10.3 10e3/uL   NEUT# 2.3  1.5 - 6.5 10e3/uL   HGB 12.4  11.6 - 15.9 g/dL   HCT 37.8  34.8 - 46.6 %   Platelets 243  145 - 400 10e3/uL   MCV 81.7  79.5 - 101.0 fL   MCH 26.8  25.1 - 34.0 pg   MCHC 32.8  31.5 - 36.0 g/dL   RBC 4.62  3.70 - 5.45 10e6/uL   RDW 14.0  11.2 - 14.5 %   lymph# 2.6  0.9 - 3.3 10e3/uL   MONO#  0.5  0.1 - 0.9 10e3/uL   Eosinophils Absolute 0.1  0.0 - 0.5 10e3/uL   Basophils Absolute 0.0  0.0 - 0.1 10e3/uL   NEUT% 42.3  38.4 - 76.8 %   LYMPH% 47.2  14.0 - 49.7 %   MONO% 8.4  0.0 - 14.0 %   EOS% 1.8  0.0 - 7.0 %   BASO% 0.3  0.0 - 2.0 %  COMPREHENSIVE METABOLIC PANEL (0000000)     Status: None   Collection Time    10/01/13  1:38 PM      Result Value Ref Range   Sodium 142  136 - 145 mEq/L   Potassium 3.9  3.5 - 5.1 mEq/L   Chloride 106  98 - 109 mEq/L   CO2 27  22 - 29 mEq/L   Glucose 85  70 - 140 mg/dl   BUN 13.7  7.0 - 26.0 mg/dL   Creatinine 0.9  0.6 - 1.1 mg/dL   Total Bilirubin 0.20  0.20 - 1.20 mg/dL   Alkaline Phosphatase 82  40 - 150 U/L   AST 18  5 - 34 U/L   ALT 14  0 - 55 U/L   Total Protein 8.1  6.4 - 8.3 g/dL  Albumin 3.7  3.5 - 5.0 g/dL   Calcium 9.1  8.4 - 10.4 mg/dL   Anion Gap 9  3 - 11 mEq/L  FERRITIN [facility]     Status: None   Collection Time    10/01/13  1:38 PM      Result Value Ref Range   Ferritin 230  9 - 269 ng/ml  IRON AND TIBC [facility]     Status: Abnormal   Collection Time    10/01/13  1:38 PM      Result Value Ref Range   Iron 52  41 - 142 ug/dL   TIBC 345  236 - 444 ug/dL   UIBC 293  120 - 384 ug/dL   %SAT 15 (*) 21 - 57 %    Labs:  Lab Results  Component Value Date   WBC 5.5 10/01/2013   HGB 12.4 10/01/2013   HCT 37.8 10/01/2013   MCV 81.7 10/01/2013   PLT 243 10/01/2013   NEUTROABS 2.3 10/01/2013      Chemistry      Component Value Date/Time   NA 142 10/01/2013 1338   NA 138 12/04/2011 1434   K 3.9 10/01/2013 1338   K 3.8 12/04/2011 1434   CL 104 08/12/2012 1329   CL 104 12/04/2011 1434   CO2 27 10/01/2013 1338   CO2 27 12/04/2011 1434   BUN 13.7 10/01/2013 1338   BUN 15 12/04/2011 1434   CREATININE 0.9 10/01/2013 1338   CREATININE 0.86 12/04/2011 1434      Component Value Date/Time   CALCIUM 9.1 10/01/2013 1338   CALCIUM 8.8 12/04/2011 1434   ALKPHOS 82 10/01/2013 1338   ALKPHOS 58 12/04/2011 1434   AST 18 10/01/2013 1338   AST 16 12/04/2011  1434   ALT 14 10/01/2013 1338   ALT 12 12/04/2011 1434   BILITOT 0.20 10/01/2013 1338   BILITOT 0.3 12/04/2011 1434     Basic Metabolic Panel:  Recent Labs Lab 10/01/13 1338  NA 142  K 3.9  CO2 27  GLUCOSE 85  BUN 13.7  CREATININE 0.9  CALCIUM 9.1   GFR Estimated Creatinine Clearance: 80.3 ml/min (by C-G formula based on Cr of 0.9). Liver Function Tests:  Recent Labs Lab 10/01/13 1338  AST 18  ALT 14  ALKPHOS 82  BILITOT 0.20  PROT 8.1  ALBUMIN 3.7   CBC:  Recent Labs Lab 10/01/13 1338  WBC 5.5  NEUTROABS 2.3  HGB 12.4  HCT 37.8  MCV 81.7  PLT 243   Anemia work up  Recent Labs  10/01/13 1338  FERRITIN 230  TIBC 345  IRON 52   Studies:  No results found.   RADIOGRAPHIC STUDIES: 05/27/2014 DIGITAL DIAGNOSTIC RIGHT MAMMOGRAM ULTRASOUND RIGHT BREAST COMPARISON: Screening study, 05/12/2013 and prior exams. ACR Breast Density Category a: The breast tissue is almost entirely fatty. FINDINGS: Small mass persists on spot compression imaging. It lies in the  lower outer right breast in the 7-8 o'clock position. Margins are slightly lobulated. Ultrasound is performed, showing an oval hypo to anechoic lesion with a thin wall in the 7:30 position of the right breast, 5 cm from the nipple. It measures 5.5 mm x 3.6 mm x 5.3 mm. No other lesions are seen in the right breast. It has mild internal echoes, but no convincing internal blood flow. This lesion corresponds to the mammographic abnormality in size, position and shape. IMPRESSION: Mildly complex cyst in the right breast. RECOMMENDATION: Repeat right breast ultrasound in 6 months.  I have discussed the findings and recommendations with the patient. Results were also provided in writing at the conclusion of the visit. If applicable, a reminder letter will be sent to the patient regarding the next appointment.  BI-RADS CATEGORY 3: Probably benign finding(s) - short interval follow-up suggested.   ASSESSMENT: Marilyn [name] 48 y.o. female with a history of ANEMIA-IRON DEFICIENCY - Plan: CBC with Differential, Basic metabolic panel (Bmet) - [facility], Ferritin, Iron and TIBC   PLAN:  1. Iron deficiency anemia secondary to menstruation, now resolving .  -- Her hemoglobin is 12.4 up from  11.7 down from 12.5 on 11/17/12 but within her range.  She denies  Menses since May 2014 and her  symptoms of anemia have improved.   Her iron studies reveal a ferritin of 231  She does not need a feraheme infusion today.  We will plan on checking her CBC with differential, CMP and iron studies upon her return visit in 6 months.  2. Abnormal mammogram of R breast. --Planning for repeat mammogram this June (see above).  She had a breast reduction surgery in 1986. She will have copies of reports faxed to our office.   All questions were answered. The patient knows to call the clinic with any problems, questions or concerns. We can certainly see the patient much sooner if necessary.  I spent 10 minutes counseling the patient face to face. The total time spent in the appointment was 15 minutes.    Concha Norway, MD 10/01/2013 2:57 PM

## 2013-10-23 ENCOUNTER — Encounter (HOSPITAL_COMMUNITY): Payer: Self-pay | Admitting: Emergency Medicine

## 2013-10-23 ENCOUNTER — Emergency Department (HOSPITAL_COMMUNITY)
Admission: EM | Admit: 2013-10-23 | Discharge: 2013-10-24 | Disposition: A | Discharge status: Home / Self Care | Payer: BC Managed Care – PPO | Attending: Emergency Medicine | Admitting: Emergency Medicine

## 2013-10-23 ENCOUNTER — Emergency Department (HOSPITAL_COMMUNITY): Payer: BC Managed Care – PPO

## 2013-10-23 DIAGNOSIS — F458 Other somatoform disorders: Secondary | ICD-10-CM | POA: Insufficient documentation

## 2013-10-23 DIAGNOSIS — E039 Hypothyroidism, unspecified: Secondary | ICD-10-CM | POA: Insufficient documentation

## 2013-10-23 DIAGNOSIS — K219 Gastro-esophageal reflux disease without esophagitis: Secondary | ICD-10-CM | POA: Insufficient documentation

## 2013-10-23 DIAGNOSIS — I10 Essential (primary) hypertension: Secondary | ICD-10-CM | POA: Insufficient documentation

## 2013-10-23 DIAGNOSIS — R599 Enlarged lymph nodes, unspecified: Secondary | ICD-10-CM | POA: Insufficient documentation

## 2013-10-23 DIAGNOSIS — Z79899 Other long term (current) drug therapy: Secondary | ICD-10-CM | POA: Insufficient documentation

## 2013-10-23 DIAGNOSIS — J45909 Unspecified asthma, uncomplicated: Secondary | ICD-10-CM | POA: Insufficient documentation

## 2013-10-23 DIAGNOSIS — T7840XA Allergy, unspecified, initial encounter: Secondary | ICD-10-CM

## 2013-10-23 DIAGNOSIS — Z87891 Personal history of nicotine dependence: Secondary | ICD-10-CM | POA: Insufficient documentation

## 2013-10-23 DIAGNOSIS — D509 Iron deficiency anemia, unspecified: Secondary | ICD-10-CM | POA: Insufficient documentation

## 2013-10-23 DIAGNOSIS — R0989 Other specified symptoms and signs involving the circulatory and respiratory systems: Secondary | ICD-10-CM

## 2013-10-23 LAB — BASIC METABOLIC PANEL
BUN: 13 mg/dL (ref 6–23)
CO2: 24 meq/L (ref 19–32)
Calcium: 9 mg/dL (ref 8.4–10.5)
Chloride: 101 mEq/L (ref 96–112)
Creatinine, Ser: 0.76 mg/dL (ref 0.50–1.10)
GFR calc Af Amer: >90 mL/min (ref >90)
GFR calc non Af Amer: >90 mL/min (ref >90)
GLUCOSE: 113 mg/dL — AB (ref 70–99)
POTASSIUM: 4 meq/L (ref 3.7–5.3)
SODIUM: 138 meq/L (ref 137–147)

## 2013-10-23 LAB — CBC
HCT: 37.1 % (ref 36.0–46.0)
Hemoglobin: 12.5 g/dL (ref 12.0–15.0)
MCH: 27.1 pg (ref 26.0–34.0)
MCHC: 33.7 g/dL (ref 30.0–36.0)
MCV: 80.3 fL (ref 78.0–100.0)
PLATELETS: 250 10*3/uL (ref 150–400)
RBC: 4.62 MIL/uL (ref 3.87–5.11)
RDW: 13.1 % (ref 11.5–15.5)
WBC: 6 10*3/uL (ref 4.0–10.5)

## 2013-10-23 MED ORDER — FAMOTIDINE IN NACL 20-0.9 MG/50ML-% IV SOLN
20.0000 mg | Freq: Once | INTRAVENOUS | Status: AC
  Administered 2013-10-23: 20 mg via INTRAVENOUS
  Filled 2013-10-23: qty 50

## 2013-10-23 MED ORDER — DIPHENHYDRAMINE HCL 50 MG/ML IJ SOLN
25.0000 mg | Freq: Once | INTRAMUSCULAR | Status: AC
  Administered 2013-10-23: 25 mg via INTRAMUSCULAR
  Filled 2013-10-23: qty 1

## 2013-10-23 MED ORDER — IOHEXOL 300 MG/ML  SOLN
80.0000 mL | Freq: Once | INTRAMUSCULAR | Status: AC | PRN
  Administered 2013-10-23: 80 mL via INTRAVENOUS

## 2013-10-23 MED ORDER — DEXAMETHASONE SODIUM PHOSPHATE 10 MG/ML IJ SOLN
10.0000 mg | Freq: Once | INTRAMUSCULAR | Status: AC
  Administered 2013-10-23: 10 mg via INTRAVENOUS
  Filled 2013-10-23: qty 1

## 2013-10-23 MED ORDER — SODIUM CHLORIDE 0.9 % IV BOLUS (SEPSIS)
1000.0000 mL | Freq: Once | INTRAVENOUS | Status: AC
  Administered 2013-10-23: 1000 mL via INTRAVENOUS

## 2013-10-23 NOTE — ED Notes (Signed)
Patient is alert and orineted x3.  She is complaining of throat swelling that make her feel like her  Throat is closing.  She adds that it started about 19:30 when she was folding clothes.  She denies  Any allergies except codeine.  She adds that she also is having difficulty swallowing.  Currently she  Denies any pain.

## 2013-10-23 NOTE — ED Provider Notes (Signed)
CSN: [id number]     Arrival date & time 10/23/13  2103 History   First MD Initiated Contact with Patient 10/23/13 2114     Chief Complaint  Patient presents with  . Oral Swelling     (Consider location/radiation/quality/duration/timing/severity/associated sxs/prior Treatment) HPI Marilyn [name] is a(n) 48 y.o. female who presents to the emergency department with chief complaint of globus sensation and throat closing. Patient states that she was eating pizza earlier. Approximately 30 minutes after finishing her meals she was watching television when she suddenly felt a swelling in the left side of her neck. She states that she drank a lot of water but feels as if there's something stuck in her throat. She is to begin having some coughing and sensations of choking. She denies any wheezing, cough swelling. She has no known history of seasonal or food allergies. She denies any symptoms of upper respiratory infection such as cough, runny nose. She denies any fevers or chills.  Past Medical History  Diagnosis Date  . Hyperlipidemia   . HYPOTHYROIDISM 01/30/2007  . 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/2007  . ASTHMA 01/30/2007  . GERD 01/31/2007  . Hiatal hernia   . [place] tear 04/2003  . Hemorrhoids   . HTN (hypertension) 09/09/2011   Past Surgical History  Procedure Laterality Date  . Abdominal hysterectomy    . Tubal ligation  1987  . Broken jaw    . Breast reduction surgery  1986  . Carpal tunnel release  2001  . Thyroidectomy     Family History  Problem Relation Age of Onset  . Hypertension Father   . Diabetes Father   . Breast cancer Sister   . Colon cancer Neg Hx   . Diabetes Maternal Grandmother   . Heart disease Maternal Grandmother   . Asthma Mother   . Asthma Maternal Grandfather    History  Substance Use Topics  . Smoking status: Former Smoker -- 0.25 packs/day for 3 years    Types: Cigarettes    Quit date: 06/04/1980  . Smokeless tobacco: Never Used  . Alcohol  Use: No   OB History   Grav Para Term Preterm Abortions TAB SAB Ect Mult Living                 Review of Systems  Ten systems reviewed and are negative for acute change, except as noted in the HPI.    Allergies  Codeine  Home Medications   Prior to Admission medications   Medication Sig Start Date End Date Taking? Authorizing Provider  albuterol (PROVENTIL HFA;VENTOLIN HFA) 108 (90 BASE) MCG/ACT inhaler Inhale 2 puffs into the lungs every 4 (four) hours as needed for wheezing or shortness of breath.    Historical Provider, MD  amLODipine (NORVASC) 10 MG tablet Take 1 tablet (10 mg total) by mouth daily. 12/15/12   Biagio Borg, MD  dicyclomine (BENTYL) 20 MG tablet Take 1 tablet (20 mg total) by mouth 4 (four) times daily -  before meals and at bedtime. 07/12/13   Leandrew Koyanagi, MD  levothyroxine (SYNTHROID, LEVOTHROID) 125 MCG tablet Take 1 tablet (125 mcg total) by mouth daily. 12/15/12   Biagio Borg, MD  vitamin B-12 (CYANOCOBALAMIN) 1000 MCG tablet Take 1,000 mcg by mouth daily.    Historical Provider, MD   BP 156/81  Pulse 99  Temp(Src) 98.3 F (36.8 C) (Oral)  Resp 18  SpO2 99% Physical Exam  Nursing note and vitals reviewed. Constitutional: She is oriented  to person, place, and time. She appears well-developed and well-nourished. No distress.  HENT:  Head: Normocephalic and atraumatic.  Mouth/Throat: Uvula is midline. No trismus in the jaw. No uvula swelling. Posterior oropharyngeal erythema present. No oropharyngeal exudate or posterior oropharyngeal edema.  Eyes: Conjunctivae are normal. No scleral icterus.  Neck: Normal range of motion.  Cardiovascular: Normal rate, regular rhythm and normal heart sounds.  Exam reveals no gallop and no friction rub.   No murmur heard. Pulmonary/Chest: Effort normal and breath sounds normal. No respiratory distress.  Abdominal: Soft. Bowel sounds are normal. She exhibits no distension and no mass. There is no tenderness. There is  no guarding.  Lymphadenopathy:    Cervical adenopathy: bl tonsilar lymphadenopathy.  Neurological: She is alert and oriented to person, place, and time.  Skin: Skin is warm and dry. She is not diaphoretic.    ED Course  Procedures (including critical care time) Labs Review Labs Reviewed - No data to display  Imaging Review No results found.   EKG Interpretation None      MDM   Final diagnoses:  Globus sensation  Allergic reaction    9:55 PM BP 156/81  Pulse 99  Temp(Src) 98.3 F (36.8 C) (Oral)  Resp 18  SpO2 99% Patient with globus sensation.  No emergent airway compromise or signs of impending compromise. Will treat with a Medrol, Decadron, and Pepcid. CT scan of the are pending.  11:58 PM Patient has had complete resolution of her sxs. On repeat exam there is no pharyngeal edema or erythema.  She has no globus sensation or diffuculty swallowing. Lungs CTAB. CT without acute abnormality. Will d/c with epipen. Avoid pizza  I personally reviewed the imaging tests through PACS system. I have reviewed and interpreted Lab values. I reviewed available ER/hospitalization records through the [place], [place] 10/24/13 0007

## 2013-10-24 MED ORDER — EPINEPHRINE 0.3 MG/0.3ML IJ SOAJ: 0.3000 mg | Freq: Once | INTRAMUSCULAR | Status: DC | PRN

## 2013-10-24 NOTE — Discharge Instructions (Signed)
Globus Syndrome Globus Syndrome is a feeling of a lump or a sensation of something caught in your throat. Eating food or drinking fluids does not seem to get rid of it. Yet it is not noticeable during the actual act of swallowing food or liquids. Usually there is nothing physically wrong. It is troublesome because it is an unpleasant sensation which is sometimes difficult to ignore and at times may seem to worsen. The syndrome is quite common. It is estimated 45% of the population experiences features of the condition at some stage during their lives. The symptoms are usually temporary. The largest group of people who feel the need to seek medical treatment is females between the ages of 48 to 56.  CAUSES  Globus Syndrome appears to be triggered by or aggravated by stress, anxiety and depression.  Tension related to stress could product abnormal muscle spasms in the esophagus which would account for the sensation of a lump or ball in your throat.  Frequent swallowing or drying of the throat caused by anxiety or other strong emotions can also produce this uncomfortable sensation in your throat.  Fear and sadness can be expressed by the body in many ways. For instance, if you had a relative with throat cancer you might become overly concerned about your own health and develop uncomfortable sensations in your throat.  The reaction to a crisis or a trauma event in your life can take the form of a lump in your throat. It is as if you are indirectly saying you can not handle or "swallow" one more thing. DIAGNOSIS  Usually your caregiver will know what is wrong by talking to you and examining you. If the condition persists for several days, more testing may be done to make sure there is not another problem present. This is usually not the case. TREATMENT   Reassurance is often the best treatment available. Usually the problem leaves without treatment over several days.  Sometimes anti-anxiety medications  may be prescribed.  Counseling or talk therapy can also help with strong underlying emotions.  Note that in most cases this is not something that keeps coming back and you should not be concerned or worried. Document Released: 08/11/2003 Document Revised: 08/13/2011 Document Reviewed: 01/08/2008 [facility] Patient Information 2014 [place], [place].       Anaphylactic Reaction An anaphylactic reaction is a sudden, severe allergic reaction that involves the whole body. It can be life threatening. A hospital stay is often required. People with asthma, eczema, or hay fever are slightly more likely to have an anaphylactic reaction. CAUSES  An anaphylactic reaction may be caused by anything to which you are allergic. After being exposed to the allergic substance, your immune system becomes sensitized to it. When you are exposed to that allergic substance again, an allergic reaction can occur. Common causes of an anaphylactic reaction include:  Medicines.  Foods, especially peanuts, wheat, shellfish, milk, and eggs.  Insect bites or stings.  Blood products.  Chemicals, such as dyes, latex, and contrast material used for imaging tests. SYMPTOMS  When an allergic reaction occurs, the body releases histamine and other substances. These substances cause symptoms such as tightening of the airway. Symptoms often develop within seconds or minutes of exposure. Symptoms may include:  Skin rash or hives.  Itching.  Chest tightness.  Swelling of the eyes, tongue, or lips.  Trouble breathing or swallowing.  Lightheadedness or fainting.  Anxiety or confusion.  Stomach pains, vomiting, or diarrhea.  Nasal congestion.  A  fast or irregular heartbeat (palpitations). DIAGNOSIS  Diagnosis is based on your history of recent exposure to allergic substances, your symptoms, and a physical exam. Your caregiver may also perform blood or urine tests to confirm the diagnosis. TREATMENT  Epinephrine  medicine is the main treatment for an anaphylactic reaction. Other medicines that may be used for treatment include antihistamines, steroids, and albuterol. In severe cases, fluids and medicine to support blood pressure may be given through an intravenous line (IV). Even if you improve after treatment, you need to be observed to make sure your condition does not get worse. This may require a stay in the hospital. [place] a medical alert bracelet or necklace stating your allergy.  You and your family must learn how to use an anaphylaxis kit or give an epinephrine injection to temporarily treat an emergency allergic reaction. Always carry your epinephrine injection or anaphylaxis kit with you. This can be lifesaving if you have a severe reaction.  Do not drive or perform tasks after treatment until the medicines used to treat your reaction have worn off, or until your caregiver says it is okay.  If you have hives or a rash:  Take medicines as directed by your caregiver.  You may use an over-the-counter antihistamine (diphenhydramine) as needed.  Apply cold compresses to the skin or take baths in cool water. Avoid hot baths or showers. SEEK MEDICAL CARE IF:   You develop symptoms of an allergic reaction to a new substance. Symptoms may start right away or minutes later.  You develop a rash, hives, or itching.  You develop new symptoms. SEEK IMMEDIATE MEDICAL CARE IF:   You have swelling of the mouth, difficulty breathing, or wheezing.  You have a tight feeling in your chest or throat.  You develop hives, swelling, or itching all over your body.  You develop severe vomiting or diarrhea.  You feel faint or pass out. This is an emergency. Use your epinephrine injection or anaphylaxis kit as you have been instructed. Call your local emergency services (911 in [place] Even if you improve after the injection, you need to be examined at a hospital emergency  department. MAKE SURE YOU:   Understand these instructions.  Will watch your condition.  Will get help right away if you are not doing well or get worse. Document Released: 05/21/2005 Document Revised: 11/20/2011 Document Reviewed: 08/22/2011 [facility] Patient Information 2014 [place], [place].

## 2013-10-25 NOTE — ED Provider Notes (Signed)
Medical screening examination/treatment/procedure(s) were performed by non-physician practitioner and as supervising physician I was immediately available for consultation/collaboration.  Leota Jacobsen, MD 10/25/13 [phone]

## 2013-11-03 ENCOUNTER — Ambulatory Visit (INDEPENDENT_AMBULATORY_CARE_PROVIDER_SITE_OTHER): Payer: BC Managed Care – PPO | Admitting: Family Medicine

## 2013-11-03 ENCOUNTER — Ambulatory Visit: Payer: BC Managed Care – PPO | Admitting: Internal Medicine

## 2013-11-03 ENCOUNTER — Encounter: Payer: Self-pay | Admitting: Family Medicine

## 2013-11-03 ENCOUNTER — Encounter: Payer: Self-pay | Admitting: *Deleted

## 2013-11-03 VITALS — BP 118/80 | HR 103 | Temp 98.7°F | Ht 62.5 in | Wt 200.0 lb

## 2013-11-03 DIAGNOSIS — J069 Acute upper respiratory infection, unspecified: Secondary | ICD-10-CM

## 2013-11-03 MED ORDER — AZITHROMYCIN 250 MG PO TABS: ORAL_TABLET | ORAL | Status: DC

## 2013-11-03 NOTE — Progress Notes (Signed)
No chief complaint on file.   HPI:  Acute visit for:  1) URI: -started: 8 days ago -symptoms:nasal congestion, sore throat, cough - worsening -denies:fever, SOB, NVD, tooth pain -has tried: OTC meds, wants zpack as reports PCP always gives her zpack for this but her appt with PCP today was cancelled by her office -sick contacts/travel/risks: denies flu exposure, tick exposure or or Ebola risks -Hx of: allergies, asthma - takes alb prn, sinusitis treated most recently in 08/2013  ROS: See pertinent positives and negatives per HPI.  Past Medical History  Diagnosis Date  . Hyperlipidemia   . HYPOTHYROIDISM 01/30/2007  . 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/2007  . ASTHMA 01/30/2007  . GERD 01/31/2007  . Hiatal hernia   . [place] tear 04/2003  . Hemorrhoids   . HTN (hypertension) 09/09/2011    Past Surgical History  Procedure Laterality Date  . Abdominal hysterectomy    . Tubal ligation  1987  . Broken jaw    . Breast reduction surgery  1986  . Carpal tunnel release  2001  . Thyroidectomy      Family History  Problem Relation Age of Onset  . Hypertension Father   . Diabetes Father   . Breast cancer Sister   . Colon cancer Neg Hx   . Diabetes Maternal Grandmother   . Heart disease Maternal Grandmother   . Asthma Mother   . Asthma Maternal Grandfather     History   Social History  . Marital Status: Single    Spouse Name: N/A    Number of Children: 3  . Years of Education: N/A   Occupational History  . [employer]    Social History Main Topics  . Smoking status: Former Smoker -- 0.25 packs/day for 3 years    Types: Cigarettes    Quit date: 06/04/1980  . Smokeless tobacco: Never Used  . Alcohol Use: No  . Drug Use: No  . Sexual Activity: None   Other Topics Concern  . None   Social History Narrative   Married    Current outpatient prescriptions:albuterol (PROVENTIL HFA;VENTOLIN HFA) 108 (90 BASE) MCG/ACT inhaler, Inhale 2 puffs into the lungs every 4  (four) hours as needed for wheezing or shortness of breath., Disp: , Rfl: ;  amLODipine (NORVASC) 10 MG tablet, Take 1 tablet (10 mg total) by mouth daily., Disp: 90 tablet, Rfl: 3 EPINEPHrine (EPIPEN 2-PAK) 0.3 mg/0.3 mL IJ SOAJ injection, Inject 0.3 mLs (0.3 mg total) into the muscle once as needed (for severe allergic reaction). CAll 911 immediately if you have to use this medicine, Disp: 1 Device, Rfl: 1;  ibuprofen (ADVIL,MOTRIN) 200 MG tablet, Take 200 mg by mouth every 6 (six) hours as needed for moderate pain., Disp: , Rfl:  levothyroxine (SYNTHROID, LEVOTHROID) 125 MCG tablet, Take 1 tablet (125 mcg total) by mouth daily., Disp: 90 tablet, Rfl: 3;  omeprazole (PRILOSEC) 20 MG capsule, Take 20 mg by mouth daily., Disp: , Rfl: ;  azithromycin (ZITHROMAX) 250 MG tablet, 2  Tabs on day one then one tab daily, Disp: 6 tablet, Rfl: 0 Current facility-administered medications:cefTRIAXone (ROCEPHIN) injection 1 g, 1 g, Intramuscular, Q24H, Rowe Clack, MD, 1 g at 05/01/13 1603  EXAM:  Filed Vitals:   11/03/13 1529  BP: 118/80  Pulse: 103  Temp: 98.7 F (37.1 C)    Body mass index is 35.97 kg/(m^2).  GENERAL: vitals reviewed and listed above, alert, oriented, appears well hydrated and in no acute distress  HEENT: atraumatic,  conjunttiva clear, no obvious abnormalities on inspection of external nose and ears, normal appearance of ear canals and TMs, clear nasal congestion, mild post oropharyngeal erythema with PND, no tonsillar edema or exudate, no sinus TTP  NECK: no obvious masses on inspection  LUNGS: clear to auscultation bilaterally, no wheezes, rales or rhonchi, good air movement  CV: HRRR, no peripheral edema  MS: moves all extremities without noticeable abnormality  PSYCH: pleasant and cooperative, no obvious depression or anxiety  ASSESSMENT AND PLAN:  Discussed the following assessment and plan:  Upper respiratory infection - Plan: azithromycin (ZITHROMAX) 250 MG  tablet  -We discussed potential etiologies, treatment side effects, likely course, antibiotic misuse, transmission, and signs of developing a serious illness. -zpack per her request and follow up with PCP if recurs or worsens -of course, we advised to return or notify a doctor immediately if symptoms worsen or persist or new concerns arise.    Patient Instructions  -As we discussed, we have prescribed a new medication for you at this appointment. We discussed the common and serious potential adverse effects of this medication and you can review these and more with the pharmacist when you pick up your medication.  Please follow the instructions for use carefully and notify us immediately if you have any problems taking this medication.  -follow up with your primary doctor if worsening or recurs or not improving      Lucretia Kern

## 2013-11-03 NOTE — Patient Instructions (Signed)
-  As we discussed, we have prescribed a new medication for you at this appointment. We discussed the common and serious potential adverse effects of this medication and you can review these and more with the pharmacist when you pick up your medication.  Please follow the instructions for use carefully and notify us immediately if you have any problems taking this medication.  -follow up with your primary doctor if worsening or recurs or not improving

## 2013-11-03 NOTE — Progress Notes (Signed)
Pre visit review using our clinic review tool, if applicable. No additional management support is needed unless otherwise documented below in the visit note. 

## 2013-12-30 ENCOUNTER — Other Ambulatory Visit: Payer: Self-pay | Admitting: Internal Medicine

## 2014-01-30 ENCOUNTER — Other Ambulatory Visit: Payer: Self-pay | Admitting: Internal Medicine

## 2014-03-01 ENCOUNTER — Other Ambulatory Visit: Payer: Self-pay | Admitting: Obstetrics and Gynecology

## 2014-03-01 DIAGNOSIS — R928 Other abnormal and inconclusive findings on diagnostic imaging of breast: Secondary | ICD-10-CM

## 2014-03-23 ENCOUNTER — Ambulatory Visit: Payer: BC Managed Care – PPO | Admitting: Internal Medicine

## 2014-03-23 DIAGNOSIS — Z0289 Encounter for other administrative examinations: Secondary | ICD-10-CM

## 2014-03-26 ENCOUNTER — Other Ambulatory Visit: Payer: Self-pay | Admitting: Family

## 2014-03-26 ENCOUNTER — Encounter (INDEPENDENT_AMBULATORY_CARE_PROVIDER_SITE_OTHER): Payer: Self-pay

## 2014-03-26 ENCOUNTER — Ambulatory Visit
Admission: RE | Admit: 2014-03-26 | Discharge: 2014-03-26 | Disposition: A | Discharge status: Home / Self Care | Payer: BC Managed Care – PPO | Source: Ambulatory Visit | Attending: Obstetrics and Gynecology | Admitting: Obstetrics and Gynecology

## 2014-03-26 ENCOUNTER — Other Ambulatory Visit: Payer: Self-pay | Admitting: Family Medicine

## 2014-03-26 DIAGNOSIS — R928 Other abnormal and inconclusive findings on diagnostic imaging of breast: Secondary | ICD-10-CM

## 2014-03-26 MED ORDER — LEVOTHYROXINE SODIUM 125 MCG PO TABS: 125.0000 ug | ORAL_TABLET | Freq: Every day | ORAL | Status: DC

## 2014-03-31 ENCOUNTER — Other Ambulatory Visit: Payer: Self-pay

## 2014-03-31 DIAGNOSIS — D509 Iron deficiency anemia, unspecified: Secondary | ICD-10-CM

## 2014-04-01 ENCOUNTER — Ambulatory Visit (HOSPITAL_BASED_OUTPATIENT_CLINIC_OR_DEPARTMENT_OTHER): Payer: BC Managed Care – PPO

## 2014-04-01 ENCOUNTER — Other Ambulatory Visit (HOSPITAL_BASED_OUTPATIENT_CLINIC_OR_DEPARTMENT_OTHER): Payer: BC Managed Care – PPO

## 2014-04-01 ENCOUNTER — Encounter: Payer: Self-pay | Admitting: Hematology

## 2014-04-01 ENCOUNTER — Telehealth: Payer: Self-pay | Admitting: Hematology

## 2014-04-01 ENCOUNTER — Ambulatory Visit (HOSPITAL_BASED_OUTPATIENT_CLINIC_OR_DEPARTMENT_OTHER): Payer: BC Managed Care – PPO | Admitting: Hematology

## 2014-04-01 VITALS — BP 131/84 | HR 89 | Temp 98.4°F | Resp 18 | Ht 62.5 in | Wt 204.3 lb

## 2014-04-01 DIAGNOSIS — N920 Excessive and frequent menstruation with regular cycle: Secondary | ICD-10-CM

## 2014-04-01 DIAGNOSIS — I10 Essential (primary) hypertension: Secondary | ICD-10-CM

## 2014-04-01 DIAGNOSIS — D509 Iron deficiency anemia, unspecified: Secondary | ICD-10-CM

## 2014-04-01 DIAGNOSIS — Z23 Encounter for immunization: Secondary | ICD-10-CM

## 2014-04-01 DIAGNOSIS — D500 Iron deficiency anemia secondary to blood loss (chronic): Secondary | ICD-10-CM

## 2014-04-01 LAB — CBC WITH DIFFERENTIAL/PLATELET
BASO%: 0.4 % (ref 0.0–2.0)
BASOS ABS: 0 10*3/uL (ref 0.0–0.1)
EOS ABS: 0.1 10*3/uL (ref 0.0–0.5)
EOS%: 2.1 % (ref 0.0–7.0)
HCT: 34.8 % (ref 34.8–46.6)
HEMOGLOBIN: 11.1 g/dL — AB (ref 11.6–15.9)
LYMPH%: 49.9 % — AB (ref 14.0–49.7)
MCH: 26.4 pg (ref 25.1–34.0)
MCHC: 32 g/dL (ref 31.5–36.0)
MCV: 82.4 fL (ref 79.5–101.0)
MONO#: 0.2 10*3/uL (ref 0.1–0.9)
MONO%: 5 % (ref 0.0–14.0)
NEUT%: 42.6 % (ref 38.4–76.8)
NEUTROS ABS: 2.1 10*3/uL (ref 1.5–6.5)
PLATELETS: 276 10*3/uL (ref 145–400)
RBC: 4.22 10*6/uL (ref 3.70–5.45)
RDW: 13.9 % (ref 11.2–14.5)
WBC: 5 10*3/uL (ref 3.9–10.3)
lymph#: 2.5 10*3/uL (ref 0.9–3.3)

## 2014-04-01 LAB — IRON AND TIBC CHCC
%SAT: 13 % — ABNORMAL LOW (ref 21–57)
Iron: 49 ug/dL (ref 41–142)
TIBC: 365 ug/dL (ref 236–444)
UIBC: 316 ug/dL (ref 120–384)

## 2014-04-01 LAB — BASIC METABOLIC PANEL (CC13)
ANION GAP: 7 meq/L (ref 3–11)
BUN: 10.8 mg/dL (ref 7.0–26.0)
CO2: 27 meq/L (ref 22–29)
Calcium: 8.2 mg/dL — ABNORMAL LOW (ref 8.4–10.4)
Chloride: 107 mEq/L (ref 98–109)
Creatinine: 0.9 mg/dL (ref 0.6–1.1)
Glucose: 109 mg/dl (ref 70–140)
Potassium: 3.6 mEq/L (ref 3.5–5.1)
Sodium: 141 mEq/L (ref 136–145)

## 2014-04-01 LAB — FERRITIN CHCC: FERRITIN: 106 ng/mL (ref 9–269)

## 2014-04-01 MED ORDER — SODIUM CHLORIDE 0.9 % IV SOLN
1020.0000 mg | Freq: Once | INTRAVENOUS | Status: AC
  Administered 2014-04-01: 1020 mg via INTRAVENOUS
  Filled 2014-04-01: qty 34

## 2014-04-01 MED ORDER — DIPHENHYDRAMINE HCL 25 MG PO CAPS
25.0000 mg | ORAL_CAPSULE | Freq: Once | ORAL | Status: AC
  Administered 2014-04-01: 25 mg via ORAL

## 2014-04-01 MED ORDER — AMLODIPINE BESYLATE 10 MG PO TABS: ORAL_TABLET | ORAL | Status: DC

## 2014-04-01 MED ORDER — PNEUMOCOCCAL VAC POLYVALENT 25 MCG/0.5ML IJ INJ
0.5000 mL | INJECTION | Freq: Once | INTRAMUSCULAR | Status: AC
  Administered 2014-04-01: 0.5 mL via INTRAMUSCULAR
  Filled 2014-04-01: qty 0.5

## 2014-04-01 MED ORDER — INFLUENZA VAC SPLIT QUAD 0.5 ML IM SUSY
0.5000 mL | PREFILLED_SYRINGE | Freq: Once | INTRAMUSCULAR | Status: AC
  Administered 2014-04-01: 0.5 mL via INTRAMUSCULAR
  Filled 2014-04-01: qty 0.5

## 2014-04-01 MED ORDER — DIPHENHYDRAMINE HCL 25 MG PO CAPS
ORAL_CAPSULE | ORAL | Status: AC
  Filled 2014-04-01: qty 1

## 2014-04-01 MED ORDER — SODIUM CHLORIDE 0.9 % IV SOLN
Freq: Once | INTRAVENOUS | Status: AC
  Administered 2014-04-01: 16:00:00 via INTRAVENOUS

## 2014-04-01 NOTE — Progress Notes (Signed)
[facility] PROGRESS NOTE DATE OF VISIT: 04/01/2014  Marilyn Cower, MD [place] [address]  DIAGNOSIS: Iron deficiency anemia - Plan: CBC with Differential, Ferritin, Iron and TIBC [facility], ferumoxytol (FERAHEME) 1,020 mg in sodium chloride 0.9 % 100 mL IVPB, Influenza vac split quadrivalent PF (FLUARIX) injection 0.5 mL, pneumococcal 23 valent vaccine (PNU-IMMUNE) injection 0.5 mL  Chief Complaint  Patient presents with  . Follow-up    CURRENT THERAPY:The patient has received Feraheme 510 mg IV on 11/10/2010, 11/17/2010, 08/01/2011 and 08/08/2011. She is symptomatic with Iron deficiency and will get a Feraheme infusion today on 04/01/2014. She also got flu and pneumonia shot today.    INTERVAL HISTORY:  Marilyn [name] 48 y.o. female with a history of iron-deficiency anemia secondary to excessive menstrual losses (2005) is here for follow-up.  Patient was last seen by Dr Juliann Mule on 10/01/2013.  She feels ok but reports being a little tired.  She had a mammogram on 9 th of December demonstrating a right breast abnormality and had ultrasound on 24 th of December.  Physicians at the breast center recommended a 6 month follow up.  She will have a repeat breast exam including mammogram in June.  She also saw her primary gynecologist (Dr. Harle Battiest ) a few weeks ago and suggested she keep this appointment and instructed her to report any new symptoms. Her last menstrual period was May, 2014.  She does report intermittent spotting.   She reports occasional dyspnea on exertion.  She also denies melena or hematochezia.  Her last colonoscopy/endoscopy was on 12/25/2010 by Dr. Lucio Edward.  She denies any pagophagia.    Patient is getting progressively more fatigued. She also have some spots on her face and left arm which looked like skin tags. Patient will be given a flu shot and a pneumonia vaccine in the office today. She also ran out of her blood  pressure medications so I did refill on her Norvasc one time for 90 pills.  MEDICAL HISTORY: Past Medical History  Diagnosis Date  . Hyperlipidemia   . HYPOTHYROIDISM 01/30/2007  . 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/2007  . ASTHMA 01/30/2007  . GERD 01/31/2007  . Hiatal hernia   . [place] tear 04/2003  . Hemorrhoids   . HTN (hypertension) 09/09/2011    INTERIM HISTORY: has HYPERTHYROIDISM; HYPOTHYROIDISM; HYPERLIPIDEMIA; Iron deficiency anemia; ASTHMA; GERD; HOT FLASHES; CHEST PAIN; Preventative health care; HTN (hypertension); Neck pain; Abdominal pain, RUQ; Hypersomnolence; DOE (dyspnea on exertion); Right shoulder pain; and Cough on her problem list.    ALLERGIES:  is allergic to codeine.  MEDICATIONS: has a current medication list which includes the following prescription(s): albuterol, amlodipine, epinephrine, ibuprofen, levothyroxine, omeprazole, and polyethylene glycol, and the following Facility-Administered Medications: ceftriaxone, ferumoxytol, influenza vac split quadrivalent pf, and pneumococcal 23 valent vaccine.  SURGICAL HISTORY:  Past Surgical History  Procedure Laterality Date  . Abdominal hysterectomy    . Tubal ligation  1987  . Broken jaw    . Breast reduction surgery  1986  . Carpal tunnel release  2001  . Thyroidectomy     PROBLEM LIST:  1. History of iron-deficiency anemia extending back to at least to May of 2005 felt to be due to excessive menstrual losses. The patient has received intravenous iron on several occasions throughout the years. She has had stools that were negative for occult blood x3 on 08/10/2009 and 12/19/2010. She has undergone endoscopy and colonoscopy by Dr.  Lucio Edward on 12/25/2010. The patient has received Feraheme 510 mg IV on 11/10/2010, 11/17/2010, 08/01/2011 and 08/08/2011.  2. History of asthma.  3. Hypertension.  4. Gastroesophageal reflux disease.  5. Irritable bowel syndrome.  6. Hypothyroidism, status post thyroidectomy in  1979.  7. History of episodic shortness of breath and irregular breathing with negative CT angiogram of the chest carried out on 12/17/2011 and negative chest x-ray from 05/15/2012. Symptoms were present going back to July 2013. Marilyn [name] has been to the emergency room on more than 1  occasion for this problem.  8. Pain, numbness and paresthesias involving left hand. The possibility of carpal tunnel syndrome needs to be investigated.  REVIEW OF SYSTEMS:   Constitutional: Denies fevers, chills or abnormal weight loss Eyes: Denies blurriness of vision Ears, nose, mouth, throat, and face: Denies mucositis or sore throat Respiratory: Denies cough, dyspnea or wheezes Cardiovascular: Denies palpitation, chest discomfort or lower extremity swelling Gastrointestinal:  Denies nausea, heartburn or change in bowel habits Skin: Denies abnormal skin rashes Lymphatics: Denies new lymphadenopathy or easy bruising Neurological:Denies numbness, tingling or new weaknesses Behavioral/Psych: Mood is stable, no new changes  All other systems were reviewed with the patient and are negative.  PHYSICAL EXAMINATION: ECOG PERFORMANCE STATUS: 0-1  Blood pressure 131/84, pulse 89, temperature 98.4 F (36.9 C), temperature source Oral, resp. rate 18, height 5' 2.5" (1.588 m), weight 204 lb 4.8 oz (92.67 kg), SpO2 100.00%.  GENERAL:alert, no distress and comfortable; obese, well-developed and nourished.  SKIN: skin color, texture, turgor are normal, no rashes or significant lesions EYES: normal, Conjunctiva are pink and non-injected, sclera clear OROPHARYNX:no exudate, no erythema and lips, buccal mucosa, and tongue normal  NECK: supple, thyroid normal size, non-tender, without nodularity LYMPH:  no palpable lymphadenopathy in the cervical, axillary or supraclavicular LUNGS: clear to auscultation and percussion with normal breathing effort HEART: regular rate & rhythm and no murmurs and no lower extremity  edema ABDOMEN:abdomen soft, non-tender and normal bowel sounds Musculoskeletal:no cyanosis of digits and no clubbing  NEURO: alert & oriented x 3 with fluent speech, no focal motor/sensory deficits   LABORATORY DATA: Results for orders placed in visit on 04/01/14 (from the past 48 hour(s))  CBC WITH DIFFERENTIAL     Status: Abnormal   Collection Time    04/01/14  2:15 PM      Result Value Ref Range   WBC 5.0  3.9 - 10.3 10e3/uL   NEUT# 2.1  1.5 - 6.5 10e3/uL   HGB 11.1 (*) 11.6 - 15.9 g/dL   HCT 34.8  34.8 - 46.6 %   Platelets 276  145 - 400 10e3/uL   MCV 82.4  79.5 - 101.0 fL   MCH 26.4  25.1 - 34.0 pg   MCHC 32.0  31.5 - 36.0 g/dL   RBC 4.22  3.70 - 5.45 10e6/uL   RDW 13.9  11.2 - 14.5 %   lymph# 2.5  0.9 - 3.3 10e3/uL   MONO# 0.2  0.1 - 0.9 10e3/uL   Eosinophils Absolute 0.1  0.0 - 0.5 10e3/uL   Basophils Absolute 0.0  0.0 - 0.1 10e3/uL   NEUT% 42.6  38.4 - 76.8 %   LYMPH% 49.9 (*) 14.0 - 49.7 %   MONO% 5.0  0.0 - 14.0 %   EOS% 2.1  0.0 - 7.0 %   BASO% 0.4  0.0 - 2.0 %  BASIC METABOLIC PANEL (KK93)     Status: Abnormal   Collection Time    04/01/14  2:15 PM      Result Value Ref Range   Sodium 141  136 - 145 mEq/L   Potassium 3.6  3.5 - 5.1 mEq/L   Chloride 107  98 - 109 mEq/L   CO2 27  22 - 29 mEq/L   Glucose 109  70 - 140 mg/dl   BUN 10.8  7.0 - 26.0 mg/dL   Creatinine 0.9  0.6 - 1.1 mg/dL   Calcium 8.2 (*) 8.4 - 10.4 mg/dL   Anion Gap 7  3 - 11 mEq/L  FERRITIN [facility]     Status: None   Collection Time    04/01/14  2:15 PM      Result Value Ref Range   Ferritin 106  9 - 269 ng/ml  IRON AND TIBC [facility]     Status: Abnormal   Collection Time    04/01/14  2:15 PM      Result Value Ref Range   Iron 49  41 - 142 ug/dL   TIBC 365  236 - 444 ug/dL   UIBC 316  120 - 384 ug/dL   %SAT 13 (*) 21 - 57 %             RADIOGRAPHIC STUDIES: 05/27/2014 DIGITAL DIAGNOSTIC RIGHT MAMMOGRAM ULTRASOUND RIGHT BREAST COMPARISON: Screening study, 05/12/2013 and prior  exams. ACR Breast Density Category a: The breast tissue is almost entirely fatty. FINDINGS: Small mass persists on spot compression imaging. It lies in the  lower outer right breast in the 7-8 o'clock position. Margins are slightly lobulated. Ultrasound is performed, showing an oval hypo to anechoic lesion with a thin wall in the 7:30 position of the right breast, 5 cm from the nipple. It measures 5.5 mm x 3.6 mm x 5.3 mm. No other lesions are seen in the right breast. It has mild internal echoes, but no convincing internal blood flow. This lesion corresponds to the mammographic abnormality in size, position and shape. IMPRESSION: Mildly complex cyst in the right breast. RECOMMENDATION: Repeat right breast ultrasound in 6 months. I have discussed the findings and recommendations with the patient. Results were also provided in writing at the conclusion of the visit. If applicable, a reminder letter will be sent to the patient regarding the next appointment.  BI-RADS CATEGORY 3: Probably benign finding(s) - short interval follow-up suggested.   ASSESSMENT: Marilyn [name] 48 y.o. female with a history of Iron deficiency anemia - Plan: CBC with Differential, Ferritin, Iron and TIBC [facility], ferumoxytol (FERAHEME) 1,020 mg in sodium chloride 0.9 % 100 mL IVPB, Influenza vac split quadrivalent PF (FLUARIX) injection 0.5 mL, pneumococcal 23 valent vaccine (PNU-IMMUNE) injection 0.5 mL   PLAN:  1. Iron deficiency anemia secondary to menstruation, now resolving .  -- Her hemoglobin is 11.1 and trending down from 12.5 grams.  She denies  Menses since May 2014. She is symptomatic from her anemia.  Her iron studies reveal a ferritin of 106 down from 230. Her iron saturation is low at  13%. She will get a feraheme infusion today.  We will plan on checking her CBC with differential, Ferritin and iron studies upon her return visit in 6 months. She is taking a Vitamin C also to help facilitate iron absorption.  2.  Immunizations. --she did get a flu and pneumonia vaccine in office today.  3. HTN. --She ran out of her BP medication Norvasc so I called a 90 day supply for 10 mg Norvasc.   All questions were answered. The patient  knows to call the clinic with any problems, questions or concerns. We can certainly see the patient much sooner if necessary.  I spent 15 minutes counseling the patient face to face. The total time spent in the appointment was 20 minutes.    Bernadene Bell, MD Medical Hematologist/Oncologist [facility] Pager: [phone] Office No: [phone]

## 2014-04-01 NOTE — Telephone Encounter (Signed)
Confirm d/t for April 2016. Mailed Cal

## 2014-04-01 NOTE — Patient Instructions (Signed)

## 2014-04-07 ENCOUNTER — Ambulatory Visit (INDEPENDENT_AMBULATORY_CARE_PROVIDER_SITE_OTHER): Payer: BC Managed Care – PPO | Admitting: Internal Medicine

## 2014-04-07 ENCOUNTER — Ambulatory Visit: Payer: BC Managed Care – PPO | Admitting: Internal Medicine

## 2014-04-07 ENCOUNTER — Encounter: Payer: Self-pay | Admitting: Internal Medicine

## 2014-04-07 VITALS — BP 120/82 | HR 90 | Temp 98.2°F | Ht 64.0 in | Wt 204.0 lb

## 2014-04-07 DIAGNOSIS — E669 Obesity, unspecified: Secondary | ICD-10-CM

## 2014-04-07 DIAGNOSIS — R600 Localized edema: Secondary | ICD-10-CM

## 2014-04-07 DIAGNOSIS — R609 Edema, unspecified: Secondary | ICD-10-CM

## 2014-04-07 DIAGNOSIS — Z0000 Encounter for general adult medical examination without abnormal findings: Secondary | ICD-10-CM

## 2014-04-07 MED ORDER — LEVOTHYROXINE SODIUM 125 MCG PO TABS: 125.0000 ug | ORAL_TABLET | Freq: Every day | ORAL | Status: DC

## 2014-04-07 MED ORDER — PHENTERMINE HCL 37.5 MG PO CAPS: 37.5000 mg | ORAL_CAPSULE | ORAL | Status: DC

## 2014-04-07 MED ORDER — HYDROCHLOROTHIAZIDE 12.5 MG PO CAPS: 12.5000 mg | ORAL_CAPSULE | Freq: Every day | ORAL | Status: DC

## 2014-04-07 MED ORDER — ALBUTEROL SULFATE HFA 108 (90 BASE) MCG/ACT IN AERS: 2.0000 | INHALATION_SPRAY | RESPIRATORY_TRACT | Status: DC | PRN

## 2014-04-07 NOTE — Progress Notes (Signed)
Pre visit review using our clinic review tool, if applicable. No additional management support is needed unless otherwise documented below in the visit note. 

## 2014-04-07 NOTE — Progress Notes (Signed)
Subjective:    Patient ID: Marilyn [name], female    DOB: 06-Jan-1966, 48 y.o.   MRN: [id number]  HPI  Here for wellness and f/u;  Overall doing ok;  Pt denies CP, worsening SOB, DOE, wheezing, orthopnea, PND, worsening LE edema, palpitations, dizziness or syncope.  Pt denies neurological change such as new headache, facial or extremity weakness.  Pt denies polydipsia, polyuria, or low sugar symptoms. Pt states overall good compliance with treatment and medications, good tolerability, and has been trying to follow lower cholesterol diet.  Pt denies worsening depressive symptoms, suicidal ideation or panic. No fever, night sweats, wt loss, loss of appetite, or other constitutional symptoms.  Pt states good ability with ADL's, has low fall risk, home safety reviewed and adequate, no other significant changes in hearing or vision, and only occasionally active with exercise.  Tx last with iron infusion per pt for iron def anemia, feels better. Niece murdered in august, some grief feeling since then, but sleeping now improving.  Drinks plenty of fluids, c/o puffiness to distal arms and legs. Has appt with GYN soon. Needs labs. Denies hyper or hypo thyroid symptoms such as voice, skin or hair change.  Does have some aching and stiffness to left hand.  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  Has marked difficulty with wt loss, asks for phentermine Past Medical History  Diagnosis Date  . Hyperlipidemia   . HYPOTHYROIDISM 01/30/2007  . 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/2007  . ASTHMA 01/30/2007  . GERD 01/31/2007  . Hiatal hernia   . [place] tear 04/2003  . Hemorrhoids   . HTN (hypertension) 09/09/2011   Past Surgical History  Procedure Laterality Date  . Abdominal hysterectomy    . Tubal ligation  1987  . Broken jaw    . Breast reduction surgery  1986  . Carpal tunnel release  2001  . Thyroidectomy      reports that she quit smoking about 33 years ago. Her smoking use included  Cigarettes. She has a .75 pack-year smoking history. She has never used smokeless tobacco. She reports that she does not drink alcohol or use illicit drugs. family history includes Asthma in her maternal grandfather and mother; Breast cancer in her sister; Diabetes in her father and maternal grandmother; Heart disease in her maternal grandmother; Hypertension in her father. There is no history of Colon cancer. Allergies  Allergen Reactions  . Codeine Other (See Comments)    Makes really sick   Current Outpatient Prescriptions on File Prior to Visit  Medication Sig Dispense Refill  . albuterol (PROVENTIL HFA;VENTOLIN HFA) 108 (90 BASE) MCG/ACT inhaler Inhale 2 puffs into the lungs every 4 (four) hours as needed for wheezing or shortness of breath.    [name] amLODipine (NORVASC) 10 MG tablet TAKE ONE TABLET BY MOUTH ONCE DAILY 90 tablet 0  . EPINEPHrine (EPIPEN 2-PAK) 0.3 mg/0.3 mL IJ SOAJ injection Inject 0.3 mLs (0.3 mg total) into the muscle once as needed (for severe allergic reaction). CAll 911 immediately if you have to use this medicine 1 Device 1  . ibuprofen (ADVIL,MOTRIN) 200 MG tablet Take 200 mg by mouth every 6 (six) hours as needed for moderate pain.    [name] omeprazole (PRILOSEC) 20 MG capsule Take 20 mg by mouth daily.    . polyethylene glycol (MIRALAX / GLYCOLAX) packet Take 17 g by mouth daily as needed.     Current Facility-Administered Medications on File Prior to Visit  Medication Dose  Route Frequency Provider Last Rate Last Dose  . cefTRIAXone (ROCEPHIN) injection 1 g  1 g Intramuscular Q24H Rowe Clack, MD   1 g at 05/01/13 1603    Review of Systems Constitutional: Negative for increased diaphoresis, other activity, appetite or other siginficant weight change  HENT: Negative for worsening hearing loss, ear pain, facial swelling, mouth sores and neck stiffness.   Eyes: Negative for other worsening pain, redness or visual disturbance.  Respiratory: Negative for shortness of  breath and wheezing.   Cardiovascular: Negative for chest pain and palpitations.  Gastrointestinal: Negative for diarrhea, blood in stool, abdominal distention or other pain Genitourinary: Negative for hematuria, flank pain or change in urine volume.  Musculoskeletal: Negative for myalgias or other joint complaints.  Skin: Negative for color change and wound.  Neurological: Negative for syncope and numbness. other than noted Hematological: Negative for adenopathy. or other swelling Psychiatric/Behavioral: Negative for hallucinations, self-injury, decreased concentration or other worsening agitation.      Objective:   Physical Exam BP 120/82 mmHg  Pulse 90  Temp(Src) 98.2 F (36.8 C) (Oral)  Ht 5\' 4"  (1.626 m)  Wt 204 lb (92.534 kg)  BMI 35.00 kg/m2  SpO2 97% VS noted,  Constitutional: Pt is oriented to person, place, and time. Appears well-developed and well-nourished.  Head: Normocephalic and atraumatic.  Right Ear: External ear normal.  Left Ear: External ear normal.  Nose: Nose normal.  Mouth/Throat: Oropharynx is clear and moist.  Eyes: Conjunctivae and EOM are normal. Pupils are equal, round, and reactive to light.  Neck: Normal range of motion. Neck supple. No JVD present. No tracheal deviation present.  Cardiovascular: Normal rate, regular rhythm, normal heart sounds and intact distal pulses.   Pulmonary/Chest: Effort normal and breath sounds without rales or wheezing  Abdominal: Soft. Bowel sounds are normal. NT. No HSM  Musculoskeletal: Normal range of motion. Exhibits trace pedal edema Lymphadenopathy:  Has no cervical adenopathy.  Neurological: Pt is alert and oriented to person, place, and time. Pt has normal reflexes. No cranial nerve deficit. Motor grossly intact Skin: Skin is warm and dry. No rash noted.  Psychiatric:  Has mild dysphoric mood and affect. Behavior is normal.     Assessment & Plan:

## 2014-04-07 NOTE — Patient Instructions (Signed)
Please take all new medication as prescribed  - the fluid pill if needed, and the wt loss medication  Please continue all other medications as before, and refills have been done if requested.  Please have the pharmacy call with any other refills you may need.  Please continue your efforts at being more active, low cholesterol diet, and weight control.  You are otherwise up to date with prevention measures today.  Please keep your appointments with your specialists as you may have planned  Please go to the LAB in the Basement (turn left off the elevator) for the tests to be done at your convenience  You will be contacted by phone if any changes need to be made immediately.  Otherwise, you will receive a letter about your results with an explanation, but please check with MyChart first.  Please remember to sign up for MyChart if you have not done so, as this will be important to you in the future with finding out test results, communicating by private email, and scheduling acute appointments online when needed.  Please return in 1 year for your yearly visit, or sooner if needed, with Lab testing done 3-5 days before

## 2014-04-11 DIAGNOSIS — R609 Edema, unspecified: Secondary | ICD-10-CM | POA: Insufficient documentation

## 2014-04-11 DIAGNOSIS — R600 Localized edema: Secondary | ICD-10-CM | POA: Insufficient documentation

## 2014-04-11 DIAGNOSIS — E669 Obesity, unspecified: Secondary | ICD-10-CM

## 2014-04-11 HISTORY — DX: Obesity, unspecified: E66.9

## 2014-04-11 NOTE — Assessment & Plan Note (Addendum)
For hct 12.5 qd prn, leg elevation, low salt diet, wt loss,  to f/u any worsening symptoms or concerns

## 2014-04-11 NOTE — Assessment & Plan Note (Signed)

## 2014-04-11 NOTE — Assessment & Plan Note (Signed)
Ok for limited phenetermine asd ,  Lower cal diet, incrased activity, to f/u any worsening symptoms or concerns

## 2014-04-14 ENCOUNTER — Other Ambulatory Visit: Payer: Self-pay | Admitting: Internal Medicine

## 2014-04-14 ENCOUNTER — Other Ambulatory Visit (INDEPENDENT_AMBULATORY_CARE_PROVIDER_SITE_OTHER): Payer: BC Managed Care – PPO

## 2014-04-14 ENCOUNTER — Encounter: Payer: Self-pay | Admitting: Internal Medicine

## 2014-04-14 DIAGNOSIS — Z0000 Encounter for general adult medical examination without abnormal findings: Secondary | ICD-10-CM

## 2014-04-14 LAB — CBC WITH DIFFERENTIAL/PLATELET
BASOS ABS: 0 10*3/uL (ref 0.0–0.1)
Basophils Relative: 0.8 % (ref 0.0–3.0)
EOS ABS: 0.1 10*3/uL (ref 0.0–0.7)
Eosinophils Relative: 1.7 % (ref 0.0–5.0)
HEMATOCRIT: 37.1 % (ref 36.0–46.0)
HEMOGLOBIN: 12.5 g/dL (ref 12.0–15.0)
LYMPHS ABS: 2.5 10*3/uL (ref 0.7–4.0)
Lymphocytes Relative: 52.9 % — ABNORMAL HIGH (ref 12.0–46.0)
MCHC: 33.6 g/dL (ref 30.0–36.0)
MCV: 81.8 fl (ref 78.0–100.0)
Monocytes Absolute: 0.3 10*3/uL (ref 0.1–1.0)
Monocytes Relative: 6 % (ref 3.0–12.0)
NEUTROS ABS: 1.8 10*3/uL (ref 1.4–7.7)
Neutrophils Relative %: 38.6 % — ABNORMAL LOW (ref 43.0–77.0)
PLATELETS: 229 10*3/uL (ref 150.0–400.0)
RBC: 4.54 Mil/uL (ref 3.87–5.11)
RDW: 14.5 % (ref 11.5–15.5)
WBC: 4.7 10*3/uL (ref 4.0–10.5)

## 2014-04-14 LAB — HEPATIC FUNCTION PANEL
ALT: 26 U/L (ref 0–35)
AST: 24 U/L (ref 0–37)
Albumin: 3.6 g/dL (ref 3.5–5.2)
Alkaline Phosphatase: 69 U/L (ref 39–117)
BILIRUBIN DIRECT: 0 mg/dL (ref 0.0–0.3)
Total Bilirubin: 0.4 mg/dL (ref 0.2–1.2)
Total Protein: 8 g/dL (ref 6.0–8.3)

## 2014-04-14 LAB — URINALYSIS, ROUTINE W REFLEX MICROSCOPIC
Bilirubin Urine: NEGATIVE
HGB URINE DIPSTICK: NEGATIVE
KETONES UR: NEGATIVE
Leukocytes, UA: NEGATIVE
Nitrite: NEGATIVE
RBC / HPF: NONE SEEN (ref 0–?)
Specific Gravity, Urine: 1.01 (ref 1.000–1.030)
Total Protein, Urine: NEGATIVE
UROBILINOGEN UA: 0.2 (ref 0.0–1.0)
Urine Glucose: NEGATIVE
WBC, UA: NONE SEEN (ref 0–?)
pH: 6 (ref 5.0–8.0)

## 2014-04-14 LAB — BASIC METABOLIC PANEL
BUN: 9 mg/dL (ref 6–23)
CALCIUM: 8.5 mg/dL (ref 8.4–10.5)
CO2: 20 meq/L (ref 19–32)
Chloride: 104 mEq/L (ref 96–112)
Creatinine, Ser: 1 mg/dL (ref 0.4–1.2)
GFR: 78.06 mL/min (ref >60.00)
Glucose, Bld: 95 mg/dL (ref 70–99)
Potassium: 3.7 mEq/L (ref 3.5–5.1)
SODIUM: 137 meq/L (ref 135–145)

## 2014-04-14 LAB — LIPID PANEL
CHOLESTEROL: 254 mg/dL — AB (ref 0–200)
HDL: 38.5 mg/dL — AB (ref >39.00)
LDL Cholesterol: 192 mg/dL — ABNORMAL HIGH (ref 0–99)
NonHDL: 215.5
Total CHOL/HDL Ratio: 7
Triglycerides: 116 mg/dL (ref 0.0–149.0)
VLDL: 23.2 mg/dL (ref 0.0–40.0)

## 2014-04-14 LAB — TSH: TSH: 7.09 u[IU]/mL — AB (ref 0.35–4.50)

## 2014-04-14 MED ORDER — ATORVASTATIN CALCIUM 20 MG PO TABS: 20.0000 mg | ORAL_TABLET | Freq: Every day | ORAL | Status: DC

## 2014-05-05 ENCOUNTER — Ambulatory Visit (INDEPENDENT_AMBULATORY_CARE_PROVIDER_SITE_OTHER): Payer: BC Managed Care – PPO | Admitting: Internal Medicine

## 2014-05-05 ENCOUNTER — Encounter: Payer: Self-pay | Admitting: Internal Medicine

## 2014-05-05 VITALS — BP 104/64 | HR 114 | Temp 98.8°F | Ht 64.0 in | Wt 203.2 lb

## 2014-05-05 DIAGNOSIS — R490 Dysphonia: Secondary | ICD-10-CM

## 2014-05-05 DIAGNOSIS — J069 Acute upper respiratory infection, unspecified: Secondary | ICD-10-CM | POA: Insufficient documentation

## 2014-05-05 DIAGNOSIS — R1310 Dysphagia, unspecified: Secondary | ICD-10-CM | POA: Insufficient documentation

## 2014-05-05 DIAGNOSIS — J3089 Other allergic rhinitis: Secondary | ICD-10-CM

## 2014-05-05 DIAGNOSIS — J309 Allergic rhinitis, unspecified: Secondary | ICD-10-CM | POA: Insufficient documentation

## 2014-05-05 MED ORDER — AZITHROMYCIN 250 MG PO TABS: ORAL_TABLET | ORAL | Status: DC

## 2014-05-05 MED ORDER — METHYLPREDNISOLONE ACETATE 80 MG/ML IJ SUSP
80.0000 mg | Freq: Once | INTRAMUSCULAR | Status: AC
  Administered 2014-05-05: 80 mg via INTRAMUSCULAR

## 2014-05-05 MED ORDER — EPINEPHRINE 0.3 MG/0.3ML IJ SOAJ: 0.3000 mg | Freq: Once | INTRAMUSCULAR | Status: DC | PRN

## 2014-05-05 MED ORDER — TRIAMCINOLONE ACETONIDE 55 MCG/ACT NA AERO: 2.0000 | INHALATION_SPRAY | Freq: Every day | NASAL | Status: DC

## 2014-05-05 NOTE — Assessment & Plan Note (Signed)
Mild to mod, for depomedrol IM, nasacort aq asd, cont claritin,  to f/u any worsening symptoms or concerns

## 2014-05-05 NOTE — Progress Notes (Signed)
Subjective:    Patient ID: Marilyn [name], female    DOB: Mar 29, 1966, 48 y.o.   MRN: [id number]  HPI  Here to f/u, c/o 1-2 wks onset difficulty swallowing, pills just seemed to get stuck in back of throat, finally able to pass with water, has happened several times since then, and gagged and couldn't swallow a chicken piece, but couldnt breathe at the time as well, ran outside for air and finally it seemed the solid chicken moved and symptoms improved.  Yesterday with marked hoarseness all day with cough and mild wheezing, better today, thinks due to exposure to a certain room at work (classroom) and allergy syptoms.  Advised for epipen with last ER visit for allergic rxn , but cannot afford. Is taking the new liptior ok. Has ongoing nasal congestion with allergies, taking the otc claritin dialy, but not flonase due to nosebleed. Also incidnetly with 2-3 days onset URI symptoms with facial pain, pressure, greenish d/c.  Past Medical History  Diagnosis Date  . Hyperlipidemia   . HYPOTHYROIDISM 01/30/2007  . 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/2007  . ASTHMA 01/30/2007  . GERD 01/31/2007  . Hiatal hernia   . [place] tear 04/2003  . Hemorrhoids   . HTN (hypertension) 09/09/2011   Past Surgical History  Procedure Laterality Date  . Abdominal hysterectomy    . Tubal ligation  1987  . Broken jaw    . Breast reduction surgery  1986  . Carpal tunnel release  2001  . Thyroidectomy      reports that she quit smoking about 33 years ago. Her smoking use included Cigarettes. She has a .75 pack-year smoking history. She has never used smokeless tobacco. She reports that she does not drink alcohol or use illicit drugs. family history includes Asthma in her maternal grandfather and mother; Breast cancer in her sister; Diabetes in her father and maternal grandmother; Heart disease in her maternal grandmother; Hypertension in her father. There is no history of Colon cancer. Allergies  Allergen Reactions    . Codeine Other (See Comments)    Makes really sick  . Flonase [Fluticasone Propionate] Other (See Comments)    Nose bleeds   Current Outpatient Prescriptions on File Prior to Visit  Medication Sig Dispense Refill  . albuterol (PROVENTIL HFA;VENTOLIN HFA) 108 (90 BASE) MCG/ACT inhaler Inhale 2 puffs into the lungs every 4 (four) hours as needed for wheezing or shortness of breath. 18 g 11  . amLODipine (NORVASC) 10 MG tablet TAKE ONE TABLET BY MOUTH ONCE DAILY 90 tablet 0  . atorvastatin (LIPITOR) 20 MG tablet Take 1 tablet (20 mg total) by mouth daily. 90 tablet 3  . EPINEPHrine (EPIPEN 2-PAK) 0.3 mg/0.3 mL IJ SOAJ injection Inject 0.3 mLs (0.3 mg total) into the muscle once as needed (for severe allergic reaction). CAll 911 immediately if you have to use this medicine 1 Device 1  . hydrochlorothiazide (MICROZIDE) 12.5 MG capsule Take 1 capsule (12.5 mg total) by mouth daily. As needed for swelling 90 capsule 3  . ibuprofen (ADVIL,MOTRIN) 200 MG tablet Take 200 mg by mouth every 6 (six) hours as needed for moderate pain.    [name] levothyroxine (SYNTHROID, LEVOTHROID) 125 MCG tablet Take 1 tablet (125 mcg total) by mouth daily before breakfast. 90 tablet 3  . omeprazole (PRILOSEC) 20 MG capsule Take 20 mg by mouth daily.    . phentermine 37.5 MG capsule Take 1 capsule (37.5 mg total) by mouth every morning. 30 capsule 2  .  polyethylene glycol (MIRALAX / GLYCOLAX) packet Take 17 g by mouth daily as needed.     Current Facility-Administered Medications on File Prior to Visit  Medication Dose Route Frequency Provider Last Rate Last Dose  . cefTRIAXone (ROCEPHIN) injection 1 g  1 g Intramuscular Q24H Rowe Clack, MD   1 g at 05/01/13 1603   Review of Systems  Constitutional: Negative for unusual diaphoresis or other sweats  HENT: Negative for ringing in ear Eyes: Negative for double vision or worsening visual disturbance.  Respiratory: Negative for choking and stridor.   Gastrointestinal:  Negative for vomiting or other signifcant bowel change Genitourinary: Negative for hematuria or decreased urine volume.  Musculoskeletal: Negative for other MSK pain or swelling Skin: Negative for color change and worsening wound.  Neurological: Negative for tremors and numbness other than noted  Psychiatric/Behavioral: Negative for decreased concentration or agitation other than above       Objective:   Physical Exam BP 104/64 mmHg  Pulse 114  Temp(Src) 98.8 F (37.1 C) (Oral)  Ht 5\' 4"  (1.626 m)  Wt 203 lb 4 oz (92.194 kg)  BMI 34.87 kg/m2  SpO2 96% VS noted, mild ill Constitutional: Pt appears well-developed, well-nourished.  HENT: Head: NCAT.  Right Ear: External ear normal.  Left Ear: External ear normal.  Bilat tm's with mild erythema.  Max sinus areas non tender.  Pharynx with mild erythema, no exudateEyes: . Pupils are equal, round, and reactive to light. Conjunctivae and EOM are normal Neck: Normal range of motion. Neck supple. s/p thyrodectomy scar, no neck masses noted Cardiovascular: Normal rate and regular rhythm.   Pulmonary/Chest: Effort normal and breath sounds normal.  Abd:  Soft, NT, ND, + BS Neurological: Pt is alert. Not confused , motor grossly intact Skin: Skin is warm. No rash Psychiatric: Pt behavior is normal. No agitation. excep tmild nervous    Assessment & Plan:

## 2014-05-05 NOTE — Progress Notes (Signed)
Pre visit review using our clinic review tool, if applicable. No additional management support is needed unless otherwise documented below in the visit note. 

## 2014-05-05 NOTE — Assessment & Plan Note (Signed)
?   Functional vs other - for GI referral/Dr Start, last EGD 2012

## 2014-05-05 NOTE — Assessment & Plan Note (Signed)
Mild to mod, for antibx course,  to f/u any worsening symptoms or concerns 

## 2014-05-05 NOTE — Addendum Note (Signed)
Addended by: Biagio Borg on: 05/05/2014 10:06 AM   Modules accepted: Orders

## 2014-05-05 NOTE — Assessment & Plan Note (Signed)
Mild, recent, with subjective cough but also dysphagia and recent chokiing episode - for ENT eval r/o vocal cords

## 2014-05-05 NOTE — Patient Instructions (Signed)
You had the steroid shot today  Please take all new medication as prescribed - the antibiotic, and the Nasacort for allergies  Please continue all other medications as before, including the antacid medication  Please have the pharmacy call with any other refills you may need.  Please keep your appointments with your specialists as you may have planned  You will be contacted regarding the referral for: ENT and GI   You are given the work note today

## 2014-05-12 ENCOUNTER — Encounter (HOSPITAL_COMMUNITY): Payer: Self-pay | Admitting: *Deleted

## 2014-05-12 ENCOUNTER — Emergency Department (HOSPITAL_COMMUNITY): Payer: BC Managed Care – PPO

## 2014-05-12 ENCOUNTER — Emergency Department (HOSPITAL_COMMUNITY)
Admission: EM | Admit: 2014-05-12 | Discharge: 2014-05-13 | Disposition: A | Discharge status: Home / Self Care | Payer: BC Managed Care – PPO | Attending: Emergency Medicine | Admitting: Emergency Medicine

## 2014-05-12 DIAGNOSIS — E039 Hypothyroidism, unspecified: Secondary | ICD-10-CM | POA: Diagnosis not present

## 2014-05-12 DIAGNOSIS — Z79899 Other long term (current) drug therapy: Secondary | ICD-10-CM | POA: Insufficient documentation

## 2014-05-12 DIAGNOSIS — J45909 Unspecified asthma, uncomplicated: Secondary | ICD-10-CM | POA: Insufficient documentation

## 2014-05-12 DIAGNOSIS — J029 Acute pharyngitis, unspecified: Secondary | ICD-10-CM | POA: Diagnosis present

## 2014-05-12 DIAGNOSIS — Z862 Personal history of diseases of the blood and blood-forming organs and certain disorders involving the immune mechanism: Secondary | ICD-10-CM | POA: Diagnosis not present

## 2014-05-12 DIAGNOSIS — R1310 Dysphagia, unspecified: Secondary | ICD-10-CM

## 2014-05-12 DIAGNOSIS — K219 Gastro-esophageal reflux disease without esophagitis: Secondary | ICD-10-CM | POA: Insufficient documentation

## 2014-05-12 DIAGNOSIS — I10 Essential (primary) hypertension: Secondary | ICD-10-CM | POA: Diagnosis not present

## 2014-05-12 DIAGNOSIS — E785 Hyperlipidemia, unspecified: Secondary | ICD-10-CM | POA: Insufficient documentation

## 2014-05-12 DIAGNOSIS — Z87891 Personal history of nicotine dependence: Secondary | ICD-10-CM | POA: Diagnosis not present

## 2014-05-12 LAB — BASIC METABOLIC PANEL
ANION GAP: 16 — AB (ref 5–15)
BUN: 14 mg/dL (ref 6–23)
CHLORIDE: 100 meq/L (ref 96–112)
CO2: 23 mEq/L (ref 19–32)
CREATININE: 0.79 mg/dL (ref 0.50–1.10)
Calcium: 9.2 mg/dL (ref 8.4–10.5)
GFR calc Af Amer: >90 mL/min (ref >90)
GFR calc non Af Amer: >90 mL/min (ref >90)
Glucose, Bld: 115 mg/dL — ABNORMAL HIGH (ref 70–99)
POTASSIUM: 3.8 meq/L (ref 3.7–5.3)
Sodium: 139 mEq/L (ref 137–147)

## 2014-05-12 LAB — CBC
HCT: 35.9 % — ABNORMAL LOW (ref 36.0–46.0)
Hemoglobin: 11.8 g/dL — ABNORMAL LOW (ref 12.0–15.0)
MCH: 27 pg (ref 26.0–34.0)
MCHC: 32.9 g/dL (ref 30.0–36.0)
MCV: 82.2 fL (ref 78.0–100.0)
PLATELETS: 222 10*3/uL (ref 150–400)
RBC: 4.37 MIL/uL (ref 3.87–5.11)
RDW: 13.5 % (ref 11.5–15.5)
WBC: 5.6 10*3/uL (ref 4.0–10.5)

## 2014-05-12 LAB — I-STAT TROPONIN, ED: Troponin i, poc: 0 ng/mL (ref 0.00–0.08)

## 2014-05-12 MED ORDER — ASPIRIN 325 MG PO TABS
325.0000 mg | ORAL_TABLET | ORAL | Status: AC
  Administered 2014-05-12: 325 mg via ORAL
  Filled 2014-05-12: qty 1

## 2014-05-12 NOTE — ED Provider Notes (Addendum)
CSN: [id number]     Arrival date & time 05/12/14  2025 History   First MD Initiated Contact with Patient 05/12/14 2333     Chief Complaint  Patient presents with  . Sore Throat     (Consider location/radiation/quality/duration/timing/severity/associated sxs/prior Treatment) HPI  This is a 48 year old female with a one-week history of sore throat. She feels like her throat is closing and she cannot breathe. She was seen by her PCP to arrange for her to see an ENT specialist but she has not been able to follow-up because she cannot afford the co-pay. She is also having nasal congestion. She denies fever or cough. She became tremulous and started complaining of chills just prior to arrival. She continues to feel chilled and was noted to be very anxious by her nurse.  Past Medical History  Diagnosis Date  . Hyperlipidemia   . HYPOTHYROIDISM 01/30/2007  . 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/2007  . ASTHMA 01/30/2007  . GERD 01/31/2007  . Hiatal hernia   . [place] tear 04/2003  . Hemorrhoids   . HTN (hypertension) 09/09/2011   Past Surgical History  Procedure Laterality Date  . Abdominal hysterectomy    . Tubal ligation  1987  . Broken jaw    . Breast reduction surgery  1986  . Carpal tunnel release  2001  . Thyroidectomy     Family History  Problem Relation Age of Onset  . Hypertension Father   . Diabetes Father   . Breast cancer Sister   . Colon cancer Neg Hx   . Diabetes Maternal Grandmother   . Heart disease Maternal Grandmother   . Asthma Mother   . Asthma Maternal Grandfather    History  Substance Use Topics  . Smoking status: Former Smoker -- 0.25 packs/day for 3 years    Types: Cigarettes    Quit date: 06/04/1980  . Smokeless tobacco: Never Used  . Alcohol Use: No   OB History    No data available     Review of Systems  All other systems reviewed and are negative.   Allergies  Codeine and Flonase  Home Medications   Prior to Admission medications    Medication Sig Start Date End Date Taking? Authorizing Provider  albuterol (PROVENTIL HFA;VENTOLIN HFA) 108 (90 BASE) MCG/ACT inhaler Inhale 2 puffs into the lungs every 4 (four) hours as needed for wheezing or shortness of breath. 04/07/14  Yes Biagio Borg, MD  amLODipine (NORVASC) 10 MG tablet TAKE ONE TABLET BY MOUTH ONCE DAILY 04/01/14  Yes Aasim Marla Roe, MD  atorvastatin (LIPITOR) 20 MG tablet Take 1 tablet (20 mg total) by mouth daily. 04/14/14 04/14/15 Yes Biagio Borg, MD  hydrochlorothiazide (MICROZIDE) 12.5 MG capsule Take 1 capsule (12.5 mg total) by mouth daily. As needed for swelling 04/07/14  Yes Biagio Borg, MD  ibuprofen (ADVIL,MOTRIN) 200 MG tablet Take 200 mg by mouth every 6 (six) hours as needed for moderate pain.   Yes Historical Provider, MD  levothyroxine (SYNTHROID, LEVOTHROID) 125 MCG tablet Take 1 tablet (125 mcg total) by mouth daily before breakfast. 04/07/14  Yes Biagio Borg, MD  omeprazole (PRILOSEC) 20 MG capsule Take 20 mg by mouth daily.   Yes Historical Provider, MD  polyethylene glycol (MIRALAX / GLYCOLAX) packet Take 17 g by mouth daily as needed for mild constipation.    Yes Historical Provider, MD  azithromycin (ZITHROMAX Z-PAK) 250 MG tablet Use as directed Patient not taking: Reported on 05/12/2014 05/05/14  Biagio Borg, MD  EPINEPHrine (EPIPEN 2-PAK) 0.3 mg/0.3 mL IJ SOAJ injection Inject 0.3 mLs (0.3 mg total) into the muscle once as needed (for severe allergic reaction). CAll 911 immediately if you have to use this medicine 05/05/14   Biagio Borg, MD  phentermine 37.5 MG capsule Take 1 capsule (37.5 mg total) by mouth every morning. 04/07/14   Biagio Borg, MD  triamcinolone (NASACORT AQ) 55 MCG/ACT AERO nasal inhaler Place 2 sprays into the nose daily. 05/05/14   Biagio Borg, MD   BP 117/72 mmHg  Pulse 84  Temp(Src) 97.4 F (36.3 C) (Oral)  Resp 28  Ht 5\' 4"  (1.626 m)  Wt 203 lb (92.08 kg)  BMI 34.83 kg/m2  SpO2 100%   Physical Exam   General: Well-developed, well-nourished female in no acute distress; appearance consistent with age of record HENT: normocephalic; atraumatic; no pharyngeal edema, erythema or exudate; uvula midline; no dysphonia; no stridor; no trismus; nasal congestion Eyes: pupils equal, round and reactive to light; extraocular muscles intact Neck: supple Heart: regular rate and rhythm; tachycardia Lungs: clear to auscultation bilaterally Abdomen: soft; nondistended; nontender; bowel sounds present Extremities: No deformity; full range of motion; pulses normal Neurologic: Awake, alert; motor function intact in all extremities and symmetric; no facial droop; tremulous Skin: Warm and dry Psychiatric: Anxious; tearful   ED Course  Procedures (including critical care time)   MDM   Nursing notes and vitals signs, including pulse oximetry, reviewed.  Summary of this visit's results, reviewed by myself:   EKG Interpretation  Date/Time:  Wednesday May 12 2014 20:33:13 EST Ventricular Rate:  101 PR Interval:  199 QRS Duration: 73 QT Interval:  373 QTC Calculation: 483 R Axis:   78 Text Interpretation:  Sinus tachycardia Borderline prolonged PR interval Borderline T abnormalities, anterior leads Rate is faster Confirmed by Kairee Isa  MD, Jenny Reichmann ([id number]) on 05/12/2014 11:34:50 PM      Labs:  Results for orders placed or performed during the hospital encounter of 05/12/14 (from the past 24 hour(s))  CBC     Status: Abnormal   Collection Time: 05/12/14  9:04 PM  Result Value Ref Range   WBC 5.6 4.0 - 10.5 K/uL   RBC 4.37 3.87 - 5.11 MIL/uL   Hemoglobin 11.8 (L) 12.0 - 15.0 g/dL   HCT 35.9 (L) 36.0 - 46.0 %   MCV 82.2 78.0 - 100.0 fL   MCH 27.0 26.0 - 34.0 pg   MCHC 32.9 30.0 - 36.0 g/dL   RDW 13.5 11.5 - 15.5 %   Platelets 222 150 - 400 K/uL  Basic metabolic panel     Status: Abnormal   Collection Time: 05/12/14  9:04 PM  Result Value Ref Range   Sodium 139 137 - 147 mEq/L   Potassium 3.8  3.7 - 5.3 mEq/L   Chloride 100 96 - 112 mEq/L   CO2 23 19 - 32 mEq/L   Glucose, Bld 115 (H) 70 - 99 mg/dL   BUN 14 6 - 23 mg/dL   Creatinine, Ser 0.79 0.50 - 1.10 mg/dL   Calcium 9.2 8.4 - 10.5 mg/dL   GFR calc non Af Amer >90 >90 mL/min   GFR calc Af Amer >90 >90 mL/min   Anion gap 16 (H) 5 - 15  I-stat troponin, ED (not at [facility])     Status: None   Collection Time: 05/12/14  9:15 PM  Result Value Ref Range   Troponin i, poc 0.00 0.00 - 0.08  ng/mL   Comment 3            Imaging Studies: Dg Neck Soft Tissue  05/13/2014   CLINICAL DATA:  Patient's throat started hurting a few hr ago. Now feels like it is swollen and she is short of breath. Upper chest pain.  EXAM: NECK SOFT TISSUES - 1+ VIEW  COMPARISON:  CT neck 10/2013  FINDINGS: There is no evidence of retropharyngeal soft tissue swelling or epiglottic enlargement. The cervical airway is unremarkable and no radio-opaque foreign body identified.  IMPRESSION: Negative.   Electronically Signed   By: Lucienne Capers M.D.   On: 05/13/2014 00:36   1:26 AM The patient is able to eat and drink without difficulty. She states she still feels like her tongue is huge but I see no pharyngeal abnormality on exam and soft tissue films of the neck are unremarkable. We will have her follow-up with [facility] as previously arranged. She is already on omeprazole for acid reflux.   Wynetta Fines, MD 05/13/14 0127  Wynetta Fines, MD 05/13/14 [phone]

## 2014-05-12 NOTE — ED Notes (Signed)
Pt reports pain in her throat and upper chest pain x 45 minutes ago.  Pt denies coughing at this time.  Pt reports trouble breathing as well.  Pain in her upper chest is worse when taking a deep breath.

## 2014-05-31 ENCOUNTER — Encounter: Payer: Self-pay | Admitting: Internal Medicine

## 2014-06-01 ENCOUNTER — Other Ambulatory Visit: Payer: Self-pay | Admitting: Family Medicine

## 2014-06-01 DIAGNOSIS — N63 Unspecified lump in unspecified breast: Secondary | ICD-10-CM

## 2014-06-04 HISTORY — PX: BREAST EXCISIONAL BIOPSY: SUR124

## 2014-06-11 ENCOUNTER — Ambulatory Visit
Admission: RE | Admit: 2014-06-11 | Discharge: 2014-06-11 | Disposition: A | Discharge status: Home / Self Care | Payer: BC Managed Care – PPO | Source: Ambulatory Visit | Attending: Family Medicine | Admitting: Family Medicine

## 2014-06-11 DIAGNOSIS — N63 Unspecified lump in unspecified breast: Secondary | ICD-10-CM

## 2014-06-22 ENCOUNTER — Emergency Department (HOSPITAL_COMMUNITY): Payer: BC Managed Care – PPO

## 2014-06-22 ENCOUNTER — Emergency Department (HOSPITAL_COMMUNITY)
Admission: EM | Admit: 2014-06-22 | Discharge: 2014-06-22 | Disposition: A | Discharge status: Home / Self Care | Payer: BC Managed Care – PPO | Attending: Emergency Medicine | Admitting: Emergency Medicine

## 2014-06-22 DIAGNOSIS — Z79899 Other long term (current) drug therapy: Secondary | ICD-10-CM | POA: Insufficient documentation

## 2014-06-22 DIAGNOSIS — R42 Dizziness and giddiness: Secondary | ICD-10-CM | POA: Diagnosis not present

## 2014-06-22 DIAGNOSIS — Z87891 Personal history of nicotine dependence: Secondary | ICD-10-CM | POA: Insufficient documentation

## 2014-06-22 DIAGNOSIS — I10 Essential (primary) hypertension: Secondary | ICD-10-CM | POA: Diagnosis not present

## 2014-06-22 DIAGNOSIS — R079 Chest pain, unspecified: Secondary | ICD-10-CM | POA: Diagnosis not present

## 2014-06-22 DIAGNOSIS — Z8719 Personal history of other diseases of the digestive system: Secondary | ICD-10-CM | POA: Insufficient documentation

## 2014-06-22 DIAGNOSIS — E039 Hypothyroidism, unspecified: Secondary | ICD-10-CM | POA: Diagnosis not present

## 2014-06-22 DIAGNOSIS — R0602 Shortness of breath: Secondary | ICD-10-CM

## 2014-06-22 DIAGNOSIS — E785 Hyperlipidemia, unspecified: Secondary | ICD-10-CM | POA: Insufficient documentation

## 2014-06-22 DIAGNOSIS — Z862 Personal history of diseases of the blood and blood-forming organs and certain disorders involving the immune mechanism: Secondary | ICD-10-CM | POA: Insufficient documentation

## 2014-06-22 DIAGNOSIS — R0600 Dyspnea, unspecified: Secondary | ICD-10-CM

## 2014-06-22 DIAGNOSIS — J45901 Unspecified asthma with (acute) exacerbation: Secondary | ICD-10-CM | POA: Diagnosis not present

## 2014-06-22 LAB — LIPASE, BLOOD: Lipase: 29 U/L (ref 11–59)

## 2014-06-22 LAB — CBC
HCT: 36.1 % (ref 36.0–46.0)
Hemoglobin: 12.3 g/dL (ref 12.0–15.0)
MCH: 27.9 pg (ref 26.0–34.0)
MCHC: 34.1 g/dL (ref 30.0–36.0)
MCV: 81.9 fL (ref 78.0–100.0)
Platelets: 222 10*3/uL (ref 150–400)
RBC: 4.41 MIL/uL (ref 3.87–5.11)
RDW: 13.1 % (ref 11.5–15.5)
WBC: 5.2 10*3/uL (ref 4.0–10.5)

## 2014-06-22 LAB — HEPATIC FUNCTION PANEL
ALT: 22 U/L (ref 0–35)
AST: 22 U/L (ref 0–37)
Albumin: 3.6 g/dL (ref 3.5–5.2)
Alkaline Phosphatase: 72 U/L (ref 39–117)
BILIRUBIN TOTAL: 0.4 mg/dL (ref 0.3–1.2)
Bilirubin, Direct: <0.1 mg/dL (ref 0.0–0.3)
Total Protein: 7.7 g/dL (ref 6.0–8.3)

## 2014-06-22 LAB — BASIC METABOLIC PANEL
ANION GAP: 7 (ref 5–15)
BUN: 15 mg/dL (ref 6–23)
CO2: 29 mmol/L (ref 19–32)
Calcium: 8.7 mg/dL (ref 8.4–10.5)
Chloride: 104 mEq/L (ref 96–112)
Creatinine, Ser: 0.9 mg/dL (ref 0.50–1.10)
GFR, EST AFRICAN AMERICAN: 85 mL/min — AB (ref >90)
GFR, EST NON AFRICAN AMERICAN: 73 mL/min — AB (ref >90)
Glucose, Bld: 104 mg/dL — ABNORMAL HIGH (ref 70–99)
POTASSIUM: 3.8 mmol/L (ref 3.5–5.1)
Sodium: 140 mmol/L (ref 135–145)

## 2014-06-22 LAB — D-DIMER, QUANTITATIVE: D-Dimer, Quant: 0.47 ug/mL-FEU (ref 0.00–0.48)

## 2014-06-22 LAB — I-STAT TROPONIN, ED: Troponin i, poc: 0 ng/mL (ref 0.00–0.08)

## 2014-06-22 LAB — TROPONIN I

## 2014-06-22 MED ORDER — MORPHINE SULFATE 4 MG/ML IJ SOLN
4.0000 mg | Freq: Once | INTRAMUSCULAR | Status: AC
  Administered 2014-06-22: 4 mg via INTRAVENOUS
  Filled 2014-06-22: qty 1

## 2014-06-22 MED ORDER — IOHEXOL 350 MG/ML SOLN
85.0000 mL | Freq: Once | INTRAVENOUS | Status: AC | PRN
  Administered 2014-06-22: 85 mL via INTRAVENOUS

## 2014-06-22 MED ORDER — ONDANSETRON HCL 4 MG/2ML IJ SOLN
4.0000 mg | Freq: Once | INTRAMUSCULAR | Status: AC
  Administered 2014-06-22: 4 mg via INTRAVENOUS
  Filled 2014-06-22: qty 2

## 2014-06-22 MED ORDER — ALBUTEROL SULFATE HFA 108 (90 BASE) MCG/ACT IN AERS
2.0000 | INHALATION_SPRAY | Freq: Once | RESPIRATORY_TRACT | Status: AC
  Administered 2014-06-22: 2 via RESPIRATORY_TRACT
  Filled 2014-06-22: qty 6.7

## 2014-06-22 NOTE — Discharge Instructions (Signed)

## 2014-06-22 NOTE — ED Notes (Addendum)
1300: Sob and rt,. Sided cp while sitting at work. Pt. Went to [facility] and was given albuterol; feeling better. Somewhat lightheaded. While at ucc, sao2 73%; after resp. Treatment sato2 98%; but when lying down, sao2 70's. Here, 99% sao2 on RA.

## 2014-06-22 NOTE — ED Notes (Signed)
MD at bedside. 

## 2014-06-22 NOTE — ED Provider Notes (Signed)
CSN: [id number]     Arrival date & time 06/22/14  12 History   First MD Initiated Contact with Patient 06/22/14 1629     Chief Complaint  Patient presents with  . Shortness of Breath  . Chest Pain     (Consider location/radiation/quality/duration/timing/severity/associated sxs/prior Treatment) HPI The patient works as an Environmental consultant at [employer]. She reports about 1 in the afternoon she was seated and developed a sudden onset of chest pain and shortness of breath. She was pain was central and somewhat to the right. It was sharp in quality. With some radiation into her back. The patient reports that she felt dizzy at the time of onset. She reports she had been active earlier in the day walking around and interacting with the children and not experiencing any chest pain or shortness of breath with exertion. She also reports she has not been having any recent cough, fever or sputum production. The patient does have a history of intermittent episodes of chest pain over the past number of years. Prior diagnostic evaluation has not shown the patient have any cardiac ischemic disease or any history of pulmonary bolus. She had initially been seen at the urgent care and the report was for documented oxygen saturation in the mid 70s. Since to the emergency department all oxygen saturation have been normal and vital signs stable. Past Medical History  Diagnosis Date  . Hyperlipidemia   . HYPOTHYROIDISM 01/30/2007  . 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/2007  . ASTHMA 01/30/2007  . GERD 01/31/2007  . Hiatal hernia   . [place] tear 04/2003  . Hemorrhoids   . HTN (hypertension) 09/09/2011   Past Surgical History  Procedure Laterality Date  . Abdominal hysterectomy    . Tubal ligation  1987  . Broken jaw    . Breast reduction surgery  1986  . Carpal tunnel release  2001  . Thyroidectomy     Family History  Problem Relation Age of Onset  . Hypertension Father   . Diabetes Father   . Breast cancer  Sister   . Colon cancer Neg Hx   . Diabetes Maternal Grandmother   . Heart disease Maternal Grandmother   . Asthma Mother   . Asthma Maternal Grandfather    History  Substance Use Topics  . Smoking status: Former Smoker -- 0.25 packs/day for 3 years    Types: Cigarettes    Quit date: 06/04/1980  . Smokeless tobacco: Never Used  . Alcohol Use: No   OB History    No data available     Review of Systems 10 Systems reviewed and are negative for acute change except as noted in the HPI.    Allergies  Codeine and Flonase  Home Medications   Prior to Admission medications   Medication Sig Start Date End Date Taking? Authorizing Provider  albuterol (PROVENTIL HFA;VENTOLIN HFA) 108 (90 BASE) MCG/ACT inhaler Inhale 2 puffs into the lungs every 4 (four) hours as needed for wheezing or shortness of breath. 04/07/14  Yes Biagio Borg, MD  amLODipine (NORVASC) 10 MG tablet TAKE ONE TABLET BY MOUTH ONCE DAILY 04/01/14  Yes Aasim Marla Roe, MD  atorvastatin (LIPITOR) 20 MG tablet Take 1 tablet (20 mg total) by mouth daily. 04/14/14 04/14/15 Yes Biagio Borg, MD  hydrochlorothiazide (MICROZIDE) 12.5 MG capsule Take 1 capsule (12.5 mg total) by mouth daily. As needed for swelling 04/07/14  Yes Biagio Borg, MD  ibuprofen (ADVIL,MOTRIN) 200 MG tablet Take 200 mg by mouth every  6 (six) hours as needed for moderate pain.   Yes Historical Provider, MD  levothyroxine (SYNTHROID, LEVOTHROID) 125 MCG tablet Take 1 tablet (125 mcg total) by mouth daily before breakfast. 04/07/14  Yes Biagio Borg, MD  azithromycin (ZITHROMAX Z-PAK) 250 MG tablet Use as directed Patient not taking: Reported on 05/12/2014 05/05/14   Biagio Borg, MD  EPINEPHrine (EPIPEN 2-PAK) 0.3 mg/0.3 mL IJ SOAJ injection Inject 0.3 mLs (0.3 mg total) into the muscle once as needed (for severe allergic reaction). CAll 911 immediately if you have to use this medicine 05/05/14   Biagio Borg, MD  phentermine 37.5 MG capsule Take 1 capsule  (37.5 mg total) by mouth every morning. Patient not taking: Reported on 06/22/2014 04/07/14   Biagio Borg, MD  triamcinolone (NASACORT AQ) 55 MCG/ACT AERO nasal inhaler Place 2 sprays into the nose daily. Patient not taking: Reported on 06/22/2014 05/05/14   Biagio Borg, MD   BP 114/72 mmHg  Pulse 82  Temp(Src) 97.6 F (36.4 C) (Oral)  Resp 19  SpO2 97%  LMP 03/18/2014 Physical Exam  Constitutional: She is oriented to person, place, and time. She appears well-developed and well-nourished.  HENT:  Head: Normocephalic and atraumatic.  Eyes: EOM are normal. Pupils are equal, round, and reactive to light.  Neck: Neck supple.  Cardiovascular: Normal rate, regular rhythm, normal heart sounds and intact distal pulses.   Pulmonary/Chest: Effort normal and breath sounds normal.  Abdominal: Soft. Bowel sounds are normal. She exhibits no distension. There is no tenderness.  Musculoskeletal: Normal range of motion. She exhibits no edema.  Neurological: She is alert and oriented to person, place, and time. She has normal strength. Coordination normal. GCS eye subscore is 4. GCS verbal subscore is 5. GCS motor subscore is 6.  Skin: Skin is warm, dry and intact.  Psychiatric: She has a normal mood and affect.    ED Course  Procedures (including critical care time) Labs Review Labs Reviewed  BASIC METABOLIC PANEL - Abnormal; Notable for the following:    Glucose, Bld 104 (*)    GFR calc non Af Amer 73 (*)    GFR calc Af Amer 85 (*)    All other components within normal limits  CBC  D-DIMER, QUANTITATIVE  HEPATIC FUNCTION PANEL  LIPASE, BLOOD  TROPONIN I  Randolm Idol, ED    Imaging Review Dg Chest 2 View  06/22/2014   CLINICAL DATA:  Short of breath and right-sided chest pain today.  EXAM: CHEST  2 VIEW  COMPARISON:  12/15/2012  FINDINGS: Cardiac silhouette normal in size and configuration. No mediastinal or hilar masses or evidence of adenopathy.  Clear lungs.  No pleural effusion or  pneumothorax.  Bony thorax is grossly intact.  IMPRESSION: No active cardiopulmonary disease.   Electronically Signed   By: Lajean Manes M.D.   On: 06/22/2014 18:05   Ct Angio Chest Pe W/cm &/or Wo Cm  06/22/2014   CLINICAL DATA:  PT. EXPERIENCED AN ASTHMA ATTACK EARLIER TODAY, C/O CHEST PAIN, SOB, HX THYROIDECTOMY WHEN PT. WAS 11 RE: GOITER  EXAM: CT ANGIOGRAPHY CHEST WITH CONTRAST  TECHNIQUE: Multidetector CT imaging of the chest was performed using the standard protocol during bolus administration of intravenous contrast. Multiplanar CT image reconstructions and MIPs were obtained to evaluate the vascular anatomy.  CONTRAST:  33mL OMNIPAQUE IOHEXOL 350 MG/ML SOLN  COMPARISON:  Current chest radiograph chest CT, 04/03/2010.  FINDINGS: No evidence of a pulmonary embolus. Heart borderline enlarged. [employer]  vessels are normal in caliber. No aortic dissection.  No neck base, axillary, mediastinal or hilar masses or pathologically enlarged lymph nodes.  Minor subsegmental atelectasis in the dependent lower lobes. No lung consolidation or edema. No pleural effusion.  Limited evaluation of the upper abdomen is unremarkable.  Degenerate changes noted of the thoracic spine most evident at T6 and T7 where there is significant endplate sclerosis, irregularity and loss of disc height. No osteoblastic or osteolytic lesions.  Review of the MIP images confirms the above findings.  IMPRESSION: 1. No evidence of a pulmonary embolus. 2. No acute findings.   Electronically Signed   By: Lajean Manes M.D.   On: 06/22/2014 20:26     EKG Interpretation   Date/Time:  Tuesday June 22 2014 16:42:36 EST Ventricular Rate:  97 PR Interval:  195 QRS Duration: 70 QT Interval:  366 QTC Calculation: 465 R Axis:   45 Text Interpretation:  Sinus rhythm Low voltage, precordial leads  Borderline T abnormalities, anterior leads Confirmed by DELOS  MD, DOUGLAS  ([id number]) on 06/22/2014 4:53:51 PM      MDM   Final diagnoses:   Chest pain, unspecified chest pain type  Dyspnea   At this point in patient's workup has been negative for PE or MI. The patient has remained stable. She has had problems with intermittent chest pain for a number of years by review of the electronic medical record. Based on current workup and history of present illness, I do feel patient is safe for discharge and follow-up with her family physician for ongoing monitoring and diagnostic studies for cardiac disease as needed. The patient had not been experiencing any exertional dyspnea, chest pain or general malaise today prior to the onset of her symptoms acutely at rest. With her workup being negative for PE and the patient not acutely showing any evidence of bronchospasm or hypoxia upon arrival, I suspect that the documented oxygen saturation at urgent care may have represented poor mechanical correlation.     Charlesetta Shanks, MD 06/23/14 [phone]

## 2014-06-22 NOTE — ED Notes (Signed)
Gave pt cranberry juice, per RN

## 2014-07-10 ENCOUNTER — Other Ambulatory Visit: Payer: Self-pay | Admitting: Hematology

## 2014-07-12 ENCOUNTER — Telehealth: Payer: Self-pay | Admitting: *Deleted

## 2014-07-12 MED ORDER — AMLODIPINE BESYLATE 10 MG PO TABS: ORAL_TABLET | ORAL | Status: DC

## 2014-07-12 NOTE — Telephone Encounter (Signed)
Received refill request for amlodipine tablets - according to EMR patient's PCP refilled the medication on 07/12/14.

## 2014-07-12 NOTE — Telephone Encounter (Signed)
Received fax pt needing refill on her amlodipine...Johny Chess

## 2014-07-15 ENCOUNTER — Telehealth: Payer: Self-pay | Admitting: Internal Medicine

## 2014-07-15 NOTE — Telephone Encounter (Signed)
Patient is requesting a call back in regards to bp meds.  She can't afford this med and is out.  She is [facility]  She is also wanting a small script for about a week sent to [facility].

## 2014-07-16 NOTE — Telephone Encounter (Signed)
Left message for pt to call back.  Need to know which BP med she is talking about

## 2014-07-20 ENCOUNTER — Other Ambulatory Visit: Payer: Self-pay

## 2014-07-20 MED ORDER — AMLODIPINE BESYLATE 10 MG PO TABS: ORAL_TABLET | ORAL | Status: DC

## 2014-08-25 ENCOUNTER — Telehealth: Payer: Self-pay | Admitting: Oncology

## 2014-08-25 NOTE — Telephone Encounter (Signed)
Spoke with patient and she is aware of her new appointment with dr Basilio Cairo

## 2014-09-01 ENCOUNTER — Telehealth: Payer: Self-pay | Admitting: Internal Medicine

## 2014-09-01 NOTE — Telephone Encounter (Signed)
Needs OV, unless getting worse such as worsening pain, fever, burning with urination, diarrhea , dizziness, or other unusual symptoms  - then would need to go to ER (or at least UC)

## 2014-09-01 NOTE — Telephone Encounter (Signed)
Patient was having some severe pain in lower stomach and now easing but has moved to lower back. Wondering if all her medications she is taking is making this happen and if she should come in. Please advise patient

## 2014-09-01 NOTE — Telephone Encounter (Signed)
Notified pt with md response. Pt states she hasn't had any of those sxs. ? If she is constipated haven't had a BM. Going to take something for constipation if sxs still persist will call back for appt...Marilyn [name]

## 2014-09-21 ENCOUNTER — Telehealth: Payer: Self-pay | Admitting: Gastroenterology

## 2014-09-21 ENCOUNTER — Encounter: Payer: Self-pay | Admitting: Internal Medicine

## 2014-09-21 ENCOUNTER — Encounter (HOSPITAL_COMMUNITY): Payer: Self-pay

## 2014-09-21 ENCOUNTER — Ambulatory Visit (INDEPENDENT_AMBULATORY_CARE_PROVIDER_SITE_OTHER): Payer: BC Managed Care – PPO | Admitting: Internal Medicine

## 2014-09-21 ENCOUNTER — Emergency Department (HOSPITAL_COMMUNITY): Payer: BC Managed Care – PPO

## 2014-09-21 ENCOUNTER — Emergency Department (HOSPITAL_COMMUNITY)
Admission: EM | Admit: 2014-09-21 | Discharge: 2014-09-22 | Disposition: A | Discharge status: Home / Self Care | Payer: BC Managed Care – PPO | Attending: Emergency Medicine | Admitting: Emergency Medicine

## 2014-09-21 VITALS — BP 130/80 | HR 102 | Temp 98.6°F | Resp 18 | Ht 64.0 in | Wt 194.1 lb

## 2014-09-21 DIAGNOSIS — Z7952 Long term (current) use of systemic steroids: Secondary | ICD-10-CM | POA: Diagnosis not present

## 2014-09-21 DIAGNOSIS — Z9851 Tubal ligation status: Secondary | ICD-10-CM | POA: Insufficient documentation

## 2014-09-21 DIAGNOSIS — Z792 Long term (current) use of antibiotics: Secondary | ICD-10-CM | POA: Diagnosis not present

## 2014-09-21 DIAGNOSIS — Z862 Personal history of diseases of the blood and blood-forming organs and certain disorders involving the immune mechanism: Secondary | ICD-10-CM | POA: Insufficient documentation

## 2014-09-21 DIAGNOSIS — R1030 Lower abdominal pain, unspecified: Secondary | ICD-10-CM

## 2014-09-21 DIAGNOSIS — J45909 Unspecified asthma, uncomplicated: Secondary | ICD-10-CM | POA: Diagnosis not present

## 2014-09-21 DIAGNOSIS — Z87891 Personal history of nicotine dependence: Secondary | ICD-10-CM | POA: Insufficient documentation

## 2014-09-21 DIAGNOSIS — R109 Unspecified abdominal pain: Secondary | ICD-10-CM

## 2014-09-21 DIAGNOSIS — N390 Urinary tract infection, site not specified: Secondary | ICD-10-CM | POA: Insufficient documentation

## 2014-09-21 DIAGNOSIS — Z8719 Personal history of other diseases of the digestive system: Secondary | ICD-10-CM | POA: Diagnosis not present

## 2014-09-21 DIAGNOSIS — R1084 Generalized abdominal pain: Secondary | ICD-10-CM | POA: Diagnosis present

## 2014-09-21 DIAGNOSIS — E039 Hypothyroidism, unspecified: Secondary | ICD-10-CM | POA: Insufficient documentation

## 2014-09-21 DIAGNOSIS — E785 Hyperlipidemia, unspecified: Secondary | ICD-10-CM | POA: Diagnosis not present

## 2014-09-21 DIAGNOSIS — Z79899 Other long term (current) drug therapy: Secondary | ICD-10-CM | POA: Diagnosis not present

## 2014-09-21 DIAGNOSIS — I10 Essential (primary) hypertension: Secondary | ICD-10-CM | POA: Insufficient documentation

## 2014-09-21 DIAGNOSIS — Z90710 Acquired absence of both cervix and uterus: Secondary | ICD-10-CM | POA: Insufficient documentation

## 2014-09-21 LAB — CBC WITH DIFFERENTIAL/PLATELET
BASOS PCT: 0 % (ref 0–1)
Basophils Absolute: 0 10*3/uL (ref 0.0–0.1)
Eosinophils Absolute: 0.1 10*3/uL (ref 0.0–0.7)
Eosinophils Relative: 2 % (ref 0–5)
HCT: 41.8 % (ref 36.0–46.0)
Hemoglobin: 14.2 g/dL (ref 12.0–15.0)
LYMPHS PCT: 50 % — AB (ref 12–46)
Lymphs Abs: 2.9 10*3/uL (ref 0.7–4.0)
MCH: 28.7 pg (ref 26.0–34.0)
MCHC: 34 g/dL (ref 30.0–36.0)
MCV: 84.6 fL (ref 78.0–100.0)
Monocytes Absolute: 0.4 10*3/uL (ref 0.1–1.0)
Monocytes Relative: 7 % (ref 3–12)
NEUTROS ABS: 2.4 10*3/uL (ref 1.7–7.7)
Neutrophils Relative %: 41 % — ABNORMAL LOW (ref 43–77)
PLATELETS: 256 10*3/uL (ref 150–400)
RBC: 4.94 MIL/uL (ref 3.87–5.11)
RDW: 12.3 % (ref 11.5–15.5)
WBC: 5.8 10*3/uL (ref 4.0–10.5)

## 2014-09-21 LAB — COMPREHENSIVE METABOLIC PANEL
ALBUMIN: 4.5 g/dL (ref 3.5–5.2)
ALT: 31 U/L (ref 0–35)
AST: 27 U/L (ref 0–37)
Alkaline Phosphatase: 88 U/L (ref 39–117)
Anion gap: 4 — ABNORMAL LOW (ref 5–15)
BILIRUBIN TOTAL: 0.3 mg/dL (ref 0.3–1.2)
BUN: 11 mg/dL (ref 6–23)
CALCIUM: 9.1 mg/dL (ref 8.4–10.5)
CO2: 29 mmol/L (ref 19–32)
Chloride: 102 mmol/L (ref 96–112)
Creatinine, Ser: 0.81 mg/dL (ref 0.50–1.10)
GFR calc Af Amer: >90 mL/min (ref >90)
GFR calc non Af Amer: 83 mL/min — ABNORMAL LOW (ref >90)
Glucose, Bld: 96 mg/dL (ref 70–99)
POTASSIUM: 3.4 mmol/L — AB (ref 3.5–5.1)
SODIUM: 135 mmol/L (ref 135–145)
TOTAL PROTEIN: 8.9 g/dL — AB (ref 6.0–8.3)

## 2014-09-21 LAB — URINALYSIS, ROUTINE W REFLEX MICROSCOPIC
BILIRUBIN URINE: NEGATIVE
GLUCOSE, UA: NEGATIVE mg/dL
HGB URINE DIPSTICK: NEGATIVE
KETONES UR: NEGATIVE mg/dL
Nitrite: NEGATIVE
PH: 6 (ref 5.0–8.0)
PROTEIN: NEGATIVE mg/dL
Specific Gravity, Urine: 1.007 (ref 1.005–1.030)
Urobilinogen, UA: 0.2 mg/dL (ref 0.0–1.0)

## 2014-09-21 LAB — URINE MICROSCOPIC-ADD ON

## 2014-09-21 LAB — LIPASE, BLOOD: LIPASE: 27 U/L (ref 11–59)

## 2014-09-21 MED ORDER — IOHEXOL 300 MG/ML  SOLN
50.0000 mL | Freq: Once | INTRAMUSCULAR | Status: AC | PRN
  Administered 2014-09-21: 50 mL via ORAL

## 2014-09-21 MED ORDER — DEXTROSE 5 % IV SOLN
1.0000 g | Freq: Once | INTRAVENOUS | Status: AC
  Administered 2014-09-21: 1 g via INTRAVENOUS
  Filled 2014-09-21: qty 10

## 2014-09-21 MED ORDER — ONDANSETRON HCL 4 MG/2ML IJ SOLN
4.0000 mg | Freq: Once | INTRAMUSCULAR | Status: AC
Start: 2014-09-21 — End: 2014-09-21
  Administered 2014-09-21: 4 mg via INTRAVENOUS
  Filled 2014-09-21: qty 2

## 2014-09-21 MED ORDER — IOHEXOL 300 MG/ML  SOLN
100.0000 mL | Freq: Once | INTRAMUSCULAR | Status: AC | PRN
  Administered 2014-09-21: 100 mL via INTRAVENOUS

## 2014-09-21 MED ORDER — MORPHINE SULFATE 4 MG/ML IJ SOLN
4.0000 mg | Freq: Once | INTRAMUSCULAR | Status: AC
Start: 2014-09-21 — End: 2014-09-21
  Administered 2014-09-21: 4 mg via INTRAVENOUS
  Filled 2014-09-21: qty 1

## 2014-09-21 MED ORDER — SODIUM CHLORIDE 0.9 % IV BOLUS (SEPSIS)
1000.0000 mL | Freq: Once | INTRAVENOUS | Status: AC
  Administered 2014-09-21: 1000 mL via INTRAVENOUS

## 2014-09-21 NOTE — Patient Instructions (Addendum)
Please go to ER as you may need to have CT scan of the abdomen and urgent labs to rule out things such as acute pancreatitis, or bowel obstruction

## 2014-09-21 NOTE — Assessment & Plan Note (Signed)
Afeb, VSS except for mild tachycardia, severe pain. Exam with some ? Distension, and low mid abd tender but o/w hx and exam not helpful.  I suspect she has severe constipatioin/obstipation but cannot r/o acute pancreatitis, cystitis or other.  To ER now please for further evaluation

## 2014-09-21 NOTE — Progress Notes (Signed)
Subjective:    Patient ID: Marilyn [name], female    DOB: 27-Aug-1965, 49 y.o.   MRN: [id number]  HPI  Here to f/u with acute, hoping to avoid ER visit copay.  C/o severe abd pain for 1 wk, worse today with  crampy pain, diffuse mid abdomen, also pain and pressure to lower lumbar/sacral area - "feels like a foot in my tailbone."  + nausea, no vomiting. + passing gas, + BM with some improvement of pain for about 2 hrs only.  No fever, Does not think she is constipated.  No prior hx of pancreatitis, abscess, bowel obstruction.  No blood noted.  Pain sometimes 10/10 today. Denies urinary symptoms such as dysuria, frequency, urgency, flank pain, hematuria or chills. Past Medical History  Diagnosis Date  . Hyperlipidemia   . HYPOTHYROIDISM 01/30/2007  . 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/2007  . ASTHMA 01/30/2007  . GERD 01/31/2007  . Hiatal hernia   . [place] tear 04/2003  . Hemorrhoids   . HTN (hypertension) 09/09/2011   Past Surgical History  Procedure Laterality Date  . Abdominal hysterectomy    . Tubal ligation  1987  . Broken jaw    . Breast reduction surgery  1986  . Carpal tunnel release  2001  . Thyroidectomy      reports that she quit smoking about 34 years ago. Her smoking use included Cigarettes. She has a .75 pack-year smoking history. She has never used smokeless tobacco. She reports that she does not drink alcohol or use illicit drugs. family history includes Asthma in her maternal grandfather and mother; Breast cancer in her sister; Diabetes in her father and maternal grandmother; Heart disease in her maternal grandmother; Hypertension in her father. There is no history of Colon cancer. Allergies  Allergen Reactions  . Codeine Other (See Comments)    Makes really sick  . Flonase [Fluticasone Propionate] Other (See Comments)    Nose bleeds   Current Outpatient Prescriptions on File Prior to Visit  Medication Sig Dispense Refill  . albuterol (PROVENTIL HFA;VENTOLIN HFA)  108 (90 BASE) MCG/ACT inhaler Inhale 2 puffs into the lungs every 4 (four) hours as needed for wheezing or shortness of breath. 18 g 11  . amLODipine (NORVASC) 10 MG tablet TAKE ONE TABLET BY MOUTH ONCE DAILY 90 tablet 3  . atorvastatin (LIPITOR) 20 MG tablet Take 1 tablet (20 mg total) by mouth daily. 90 tablet 3  . azithromycin (ZITHROMAX Z-PAK) 250 MG tablet Use as directed 6 tablet 1  . EPINEPHrine (EPIPEN 2-PAK) 0.3 mg/0.3 mL IJ SOAJ injection Inject 0.3 mLs (0.3 mg total) into the muscle once as needed (for severe allergic reaction). CAll 911 immediately if you have to use this medicine 1 Device 1  . hydrochlorothiazide (MICROZIDE) 12.5 MG capsule Take 1 capsule (12.5 mg total) by mouth daily. As needed for swelling 90 capsule 3  . ibuprofen (ADVIL,MOTRIN) 200 MG tablet Take 200 mg by mouth every 6 (six) hours as needed for moderate pain.    [name] levothyroxine (SYNTHROID, LEVOTHROID) 125 MCG tablet Take 1 tablet (125 mcg total) by mouth daily before breakfast. 90 tablet 3  . phentermine 37.5 MG capsule Take 1 capsule (37.5 mg total) by mouth every morning. 30 capsule 2  . triamcinolone (NASACORT AQ) 55 MCG/ACT AERO nasal inhaler Place 2 sprays into the nose daily. 1 Inhaler 12   No current facility-administered medications on file prior to visit.   Review of Systems All otherwise neg per pt  Objective:   Physical Exam BP 130/80 mmHg  Pulse 102  Temp(Src) 98.6 F (37 C) (Oral)  Resp 18  Ht 5\' 4"  (1.626 m)  Wt 194 lb 1.9 oz (88.052 kg)  BMI 33.30 kg/m2  SpO2 96% VS noted, appears in marked pain, grimaces to lie down very slowly on exam table due to above Constitutional: Pt appears in no significant distress HENT: Head: NCAT.  Right Ear: External ear normal.  Left Ear: External ear normal.  Eyes: . Pupils are equal, round, and reactive to light. Conjunctivae and EOM are normal Neck: Normal range of motion. Neck supple.  Cardiovascular: Normal rate and regular rhythm.     Pulmonary/Chest: Effort normal and breath sounds without rales or wheezing.  Abd:  Soft, + BS, mild distended, mod tender low mid abdomen without guarding or reboiund Neurological: Pt is alert. Not confused , motor grossly intact Skin: Skin is warm. No rash, no LE edema Psychiatric: Pt behavior is normal. No agitation.     Assessment & Plan:

## 2014-09-21 NOTE — Telephone Encounter (Signed)
Patient reports that she is having lower abdominal pain and back pain when she changes position.  She says she can't afford to come to a specialist because of the co-pays.  She is advised that can't say her symptoms are necessarily GI related or not.  She is advised to see her primary care and if he feels it is GI related he can refer her here.  She reports that Dr. Jenny Reichmann had referred her to GI a few months ago for dysphagia and hoarseness, but she couldn't come due to the co-pays.  She is worried her primary will just tell her to come here.  I advised her that these are different symptoms than the referral in the system.  She is advised we can schedule when she is ready, but these symptoms are not necessarily GI and to avoid  A specialty co-pay she may want to start back with her primary care.  She will call back if she wants to schedule for GI causes

## 2014-09-21 NOTE — Progress Notes (Signed)
Pre visit review using our clinic review tool, if applicable. No additional management support is needed unless otherwise documented below in the visit note. 

## 2014-09-21 NOTE — ED Notes (Signed)
Pt presents with c/o abdominal pain for 3-4 days. Pt reports she has been able to have a bowel movement, no vomiting, reports nausea. Pt went to her doctor's office today and was told to come here to rule out bowel obstruction or pancreatitis.

## 2014-09-22 MED ORDER — PROMETHAZINE HCL 25 MG PO TABS: 25.0000 mg | ORAL_TABLET | Freq: Four times a day (QID) | ORAL | Status: DC | PRN

## 2014-09-22 MED ORDER — TRAMADOL HCL 50 MG PO TABS
50.0000 mg | ORAL_TABLET | Freq: Four times a day (QID) | ORAL | Status: DC | PRN
Start: 2014-09-22 — End: 2014-11-10

## 2014-09-22 MED ORDER — CEPHALEXIN 500 MG PO CAPS: 500.0000 mg | ORAL_CAPSULE | Freq: Four times a day (QID) | ORAL | Status: DC

## 2014-09-22 NOTE — Discharge Instructions (Signed)
CT scan was normal. However you have a urinary tract infection. Increase fluids. Prescriptions for antibiotic, pain medicine, nausea medicine

## 2014-09-22 NOTE — ED Provider Notes (Signed)
CSN: [id number]     Arrival date & time 09/21/14  1904 History   First MD Initiated Contact with Patient 09/21/14 2045     Chief Complaint  Patient presents with  . Abdominal Pain     (Consider location/radiation/quality/duration/timing/severity/associated sxs/prior Treatment) HPI...Marland KitchenMarland KitchenMarland Kitchen generalized lower abdominal pain with radiation to both flanks for 1 week intermittently. Not associated with any activity. Nothing makes symptoms better or worse. She has a decreased appetite but is eating. No vomiting, no diarrhea. She is nauseated. Normal urination. No previous abdominal surgery. No vaginal bleeding or discharge.  Past Medical History  Diagnosis Date  . Hyperlipidemia   . HYPOTHYROIDISM 01/30/2007  . 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/2007  . ASTHMA 01/30/2007  . GERD 01/31/2007  . Hiatal hernia   . [place] tear 04/2003  . Hemorrhoids   . HTN (hypertension) 09/09/2011   Past Surgical History  Procedure Laterality Date  . Abdominal hysterectomy    . Tubal ligation  1987  . Broken jaw    . Breast reduction surgery  1986  . Carpal tunnel release  2001  . Thyroidectomy     Family History  Problem Relation Age of Onset  . Hypertension Father   . Diabetes Father   . Breast cancer Sister   . Colon cancer Neg Hx   . Diabetes Maternal Grandmother   . Heart disease Maternal Grandmother   . Asthma Mother   . Asthma Maternal Grandfather    History  Substance Use Topics  . Smoking status: Former Smoker -- 0.25 packs/day for 3 years    Types: Cigarettes    Quit date: 06/04/1980  . Smokeless tobacco: Never Used  . Alcohol Use: No   OB History    No data available     Review of Systems  All other systems reviewed and are negative.     Allergies  Codeine and Flonase  Home Medications   Prior to Admission medications   Medication Sig Start Date End Date Taking? Authorizing Provider  acetaminophen (TYLENOL) 325 MG tablet Take 325 mg by mouth every 6 (six) hours as  needed for moderate pain.   Yes Historical Provider, MD  albuterol (PROVENTIL HFA;VENTOLIN HFA) 108 (90 BASE) MCG/ACT inhaler Inhale 2 puffs into the lungs every 4 (four) hours as needed for wheezing or shortness of breath. 04/07/14  Yes Biagio Borg, MD  amLODipine (NORVASC) 10 MG tablet TAKE ONE TABLET BY MOUTH ONCE DAILY 07/20/14  Yes Biagio Borg, MD  atorvastatin (LIPITOR) 20 MG tablet Take 1 tablet (20 mg total) by mouth daily. 04/14/14 04/14/15 Yes Biagio Borg, MD  hydrochlorothiazide (MICROZIDE) 12.5 MG capsule Take 1 capsule (12.5 mg total) by mouth daily. As needed for swelling 04/07/14  Yes Biagio Borg, MD  hydroxypropyl methylcellulose / hypromellose (ISOPTO TEARS / GONIOVISC) 2.5 % ophthalmic solution Place 1 drop into both eyes at bedtime.   Yes Historical Provider, MD  ibuprofen (ADVIL,MOTRIN) 200 MG tablet Take 200 mg by mouth every 6 (six) hours as needed for moderate pain.   Yes Historical Provider, MD  levothyroxine (SYNTHROID, LEVOTHROID) 125 MCG tablet Take 1 tablet (125 mcg total) by mouth daily before breakfast. 04/07/14  Yes Biagio Borg, MD  loratadine (CLARITIN) 10 MG tablet Take 10 mg by mouth daily as needed for allergies.   Yes Historical Provider, MD  Multiple Vitamin (MULTIVITAMIN WITH MINERALS) TABS tablet Take 1 tablet by mouth daily.   Yes Historical Provider, MD  triamcinolone (NASACORT AQ) 55 MCG/ACT  AERO nasal inhaler Place 2 sprays into the nose daily. Patient taking differently: Place 2 sprays into the nose 2 (two) times daily as needed (allergies).  05/05/14  Yes Biagio Borg, MD  azithromycin (ZITHROMAX Z-PAK) 250 MG tablet Use as directed Patient not taking: Reported on 09/21/2014 05/05/14   Biagio Borg, MD  cephALEXin (KEFLEX) 500 MG capsule Take 1 capsule (500 mg total) by mouth 4 (four) times daily. 09/21/14   Nat Christen, MD  EPINEPHrine (EPIPEN 2-PAK) 0.3 mg/0.3 mL IJ SOAJ injection Inject 0.3 mLs (0.3 mg total) into the muscle once as needed (for severe  allergic reaction). CAll 911 immediately if you have to use this medicine Patient not taking: Reported on 09/21/2014 05/05/14   Biagio Borg, MD  phentermine 37.5 MG capsule Take 1 capsule (37.5 mg total) by mouth every morning. Patient not taking: Reported on 09/21/2014 04/07/14   Biagio Borg, MD  promethazine (PHENERGAN) 25 MG tablet Take 1 tablet (25 mg total) by mouth every 6 (six) hours as needed for nausea. 09/22/14   Nat Christen, MD  traMADol (ULTRAM) 50 MG tablet Take 1 tablet (50 mg total) by mouth every 6 (six) hours as needed. 09/22/14   Nat Christen, MD   BP 107/71 mmHg  Pulse 77  Temp(Src) 98 F (36.7 C) (Oral)  Resp 13  SpO2 100% Physical Exam  Constitutional: She is oriented to person, place, and time. She appears well-developed and well-nourished.  HENT:  Head: Normocephalic and atraumatic.  Eyes: Conjunctivae and EOM are normal. Pupils are equal, round, and reactive to light.  Neck: Normal range of motion. Neck supple.  Cardiovascular: Normal rate and regular rhythm.   Pulmonary/Chest: Effort normal and breath sounds normal.  Abdominal: Soft. Bowel sounds are normal.  Minimal generalized abdominal tenderness. No point tenderness.  Genitourinary:  Minimal bilateral flank tenderness.  Musculoskeletal: Normal range of motion.  Neurological: She is alert and oriented to person, place, and time.  Skin: Skin is warm and dry.  Psychiatric: She has a normal mood and affect. Her behavior is normal.  Nursing note and vitals reviewed.   ED Course  Procedures (including critical care time) Labs Review Labs Reviewed  CBC WITH DIFFERENTIAL/PLATELET - Abnormal; Notable for the following:    Neutrophils Relative % 41 (*)    Lymphocytes Relative 50 (*)    All other components within normal limits  COMPREHENSIVE METABOLIC PANEL - Abnormal; Notable for the following:    Potassium 3.4 (*)    Total Protein 8.9 (*)    GFR calc non Af Amer 83 (*)    Anion gap 4 (*)    All other  components within normal limits  URINALYSIS, ROUTINE W REFLEX MICROSCOPIC - Abnormal; Notable for the following:    APPearance CLOUDY (*)    Leukocytes, UA MODERATE (*)    All other components within normal limits  URINE MICROSCOPIC-ADD ON - Abnormal; Notable for the following:    Squamous Epithelial / LPF FEW (*)    Bacteria, UA MANY (*)    All other components within normal limits  URINE CULTURE  LIPASE, BLOOD    Imaging Review Ct Abdomen Pelvis W Contrast  09/21/2014   CLINICAL DATA:  Abdominal pain for 3-4 days.  EXAM: CT ABDOMEN AND PELVIS WITH CONTRAST  TECHNIQUE: Multidetector CT imaging of the abdomen and pelvis was performed using the standard protocol following bolus administration of intravenous contrast.  CONTRAST:  127mL OMNIPAQUE IOHEXOL 300 MG/ML  SOLN  COMPARISON:  11/15/2004  FINDINGS: BODY WALL: There is a small nodule in the lower right breast, correlating with sonographic report 06/11/2014. Followup has already been recommended.  LOWER CHEST: Negative.  ABDOMEN/PELVIS:  Liver: No focal abnormality.  Biliary: No evidence of biliary obstruction or stone.  Pancreas: Unremarkable.  Spleen: Unremarkable.  Adrenals: Unremarkable.  Kidneys and ureters: No hydronephrosis or stone.  Bladder: Unremarkable.  Reproductive: Multiple enhancing myometrial masses, distorting the endometrium and measuring up to 6 cm in the left body. These have progressed in size/ number since 2006. Negative ovaries. There are varicosities of the bilateral lower ovarian veins.  Bowel: No obstruction. Normal appendix.  Retroperitoneum: Chronic and stable prominence of ileocolic lymph nodes.  Peritoneum: No ascites or pneumoperitoneum.  OSSEOUS: No acute abnormalities.  IMPRESSION: 1. No explanation for acute abdominal pain. 2. Multi fibroid uterus.   Electronically Signed   By: Monte Fantasia M.D.   On: 09/21/2014 23:33     EKG Interpretation None      MDM   Final diagnoses:  Lower abdominal pain  UTI  (lower urinary tract infection)    No acute abdomen. CT scan shows no acute findings. Urinalysis shows gross infection. IV Rocephin. IV fluids. Pain management. Discharge medications cephalexin 500 mg 4 times a day, tramadol, Phenergan 25 mg    Nat Christen, MD 09/22/14 [phone]

## 2014-09-23 LAB — URINE CULTURE: Special Requests: NORMAL

## 2014-09-24 ENCOUNTER — Telehealth: Payer: Self-pay | Admitting: Internal Medicine

## 2014-09-24 MED ORDER — PHENAZOPYRIDINE HCL 100 MG PO TABS: 100.0000 mg | ORAL_TABLET | Freq: Three times a day (TID) | ORAL | Status: DC | PRN

## 2014-09-24 NOTE — Telephone Encounter (Signed)
Pt already has tramadol as needed, and advil for pain.  Was just seen in ER for UTI - CT negative o/w for acute  I can add pyridium for pain, but o/w would avoid other narcotic

## 2014-09-24 NOTE — Telephone Encounter (Signed)
Notified pt with md response. Resent to [place]...Johny Chess

## 2014-09-24 NOTE — Telephone Encounter (Signed)
Pt called said that she needs stronger pain meds.  She can not sit or stand.  What else can she take for pain?

## 2014-09-28 ENCOUNTER — Emergency Department (HOSPITAL_COMMUNITY)
Admission: EM | Admit: 2014-09-28 | Discharge: 2014-09-28 | Disposition: A | Discharge status: Home / Self Care | Payer: BC Managed Care – PPO | Attending: Emergency Medicine | Admitting: Emergency Medicine

## 2014-09-28 ENCOUNTER — Encounter (HOSPITAL_COMMUNITY): Payer: Self-pay

## 2014-09-28 ENCOUNTER — Emergency Department (HOSPITAL_COMMUNITY): Payer: BC Managed Care – PPO

## 2014-09-28 DIAGNOSIS — Z862 Personal history of diseases of the blood and blood-forming organs and certain disorders involving the immune mechanism: Secondary | ICD-10-CM | POA: Diagnosis not present

## 2014-09-28 DIAGNOSIS — M545 Low back pain, unspecified: Secondary | ICD-10-CM

## 2014-09-28 DIAGNOSIS — R0602 Shortness of breath: Secondary | ICD-10-CM | POA: Insufficient documentation

## 2014-09-28 DIAGNOSIS — J45909 Unspecified asthma, uncomplicated: Secondary | ICD-10-CM | POA: Diagnosis not present

## 2014-09-28 DIAGNOSIS — I10 Essential (primary) hypertension: Secondary | ICD-10-CM | POA: Insufficient documentation

## 2014-09-28 DIAGNOSIS — Z8719 Personal history of other diseases of the digestive system: Secondary | ICD-10-CM | POA: Diagnosis not present

## 2014-09-28 DIAGNOSIS — Z79899 Other long term (current) drug therapy: Secondary | ICD-10-CM | POA: Diagnosis not present

## 2014-09-28 DIAGNOSIS — E039 Hypothyroidism, unspecified: Secondary | ICD-10-CM | POA: Insufficient documentation

## 2014-09-28 DIAGNOSIS — M549 Dorsalgia, unspecified: Secondary | ICD-10-CM | POA: Diagnosis present

## 2014-09-28 DIAGNOSIS — E785 Hyperlipidemia, unspecified: Secondary | ICD-10-CM | POA: Diagnosis not present

## 2014-09-28 DIAGNOSIS — Z87891 Personal history of nicotine dependence: Secondary | ICD-10-CM | POA: Diagnosis not present

## 2014-09-28 LAB — URINALYSIS, ROUTINE W REFLEX MICROSCOPIC
Bilirubin Urine: NEGATIVE
GLUCOSE, UA: NEGATIVE mg/dL
Hgb urine dipstick: NEGATIVE
Ketones, ur: NEGATIVE mg/dL
Leukocytes, UA: NEGATIVE
Nitrite: NEGATIVE
PROTEIN: NEGATIVE mg/dL
SPECIFIC GRAVITY, URINE: 1.007 (ref 1.005–1.030)
Urobilinogen, UA: 0.2 mg/dL (ref 0.0–1.0)
pH: 6 (ref 5.0–8.0)

## 2014-09-28 LAB — BASIC METABOLIC PANEL
Anion gap: 12 (ref 5–15)
BUN: 11 mg/dL (ref 6–23)
CALCIUM: 8.7 mg/dL (ref 8.4–10.5)
CO2: 24 mmol/L (ref 19–32)
Chloride: 101 mmol/L (ref 96–112)
Creatinine, Ser: 0.84 mg/dL (ref 0.50–1.10)
GFR calc Af Amer: >90 mL/min (ref >90)
GFR calc non Af Amer: 80 mL/min — ABNORMAL LOW (ref >90)
Glucose, Bld: 114 mg/dL — ABNORMAL HIGH (ref 70–99)
POTASSIUM: 3.4 mmol/L — AB (ref 3.5–5.1)
Sodium: 137 mmol/L (ref 135–145)

## 2014-09-28 LAB — CBC
HCT: 37.1 % (ref 36.0–46.0)
HEMOGLOBIN: 12.6 g/dL (ref 12.0–15.0)
MCH: 28.1 pg (ref 26.0–34.0)
MCHC: 34 g/dL (ref 30.0–36.0)
MCV: 82.8 fL (ref 78.0–100.0)
Platelets: 228 10*3/uL (ref 150–400)
RBC: 4.48 MIL/uL (ref 3.87–5.11)
RDW: 12.2 % (ref 11.5–15.5)
WBC: 5.5 10*3/uL (ref 4.0–10.5)

## 2014-09-28 LAB — BRAIN NATRIURETIC PEPTIDE: B Natriuretic Peptide: 9.7 pg/mL (ref 0.0–100.0)

## 2014-09-28 LAB — I-STAT TROPONIN, ED: TROPONIN I, POC: 0 ng/mL (ref 0.00–0.08)

## 2014-09-28 MED ORDER — HYDROCODONE-ACETAMINOPHEN 5-325 MG PO TABS: 1.0000 | ORAL_TABLET | Freq: Four times a day (QID) | ORAL | Status: DC | PRN

## 2014-09-28 MED ORDER — HYDROCODONE-ACETAMINOPHEN 5-325 MG PO TABS
2.0000 | ORAL_TABLET | Freq: Once | ORAL | Status: AC
  Administered 2014-09-28: 2 via ORAL
  Filled 2014-09-28: qty 2

## 2014-09-28 MED ORDER — POTASSIUM CHLORIDE CRYS ER 20 MEQ PO TBCR
40.0000 meq | EXTENDED_RELEASE_TABLET | Freq: Once | ORAL | Status: AC
  Administered 2014-09-28: 40 meq via ORAL
  Filled 2014-09-28: qty 2

## 2014-09-28 NOTE — ED Notes (Signed)
Pt stable, ambulatory, pain decreased to 2/10, states understanding of discharge instructions

## 2014-09-28 NOTE — ED Notes (Signed)
Pt started having lower abd pain and lower back pain all this morning and then after walking kids out for buses at work she felt like she was going to pass out and felt SOB.

## 2014-09-28 NOTE — Discharge Instructions (Signed)
Take the tramadol or Tylenol prescribed you at your last visit for mild pain, or take the pain medicine prescribed for bad pain. Call Dr. Jenny Reichmann tomorrow to schedule a follow-up visit. Tell office staff that you were seen here. Stop taking Keflex (Cephalexin), the antibiotic prescribed earlier. It is no longer needed

## 2014-09-28 NOTE — ED Provider Notes (Signed)
CSN: [id number]     Arrival date & time 09/28/14  1516 History   First MD Initiated Contact with Patient 09/28/14 1716     Chief Complaint  Patient presents with  . Abdominal Pain  . Back Pain  . Shortness of Breath     (Consider location/radiation/quality/duration/timing/severity/associated sxs/prior Treatment) HPI Complains of right-sided paralumbar back pain onset 3 weeks ago pain is worse with changing positions improved with remaining still. She also complained of lower abdominal pain 3 weeks ago. Abdominal pain has since resolved. She was seen in the emergency department on 09/21/2014 diagnosed with urinary tract infection. Had CT scan of the abdomen and pelvis which showed no acute abnormality. Last bowel movement today, normal. No loss of bladder or bowel control. Other associated symptoms include shortness of breath. She denies any chest pain denies fever denies cough. She's been treated with Keflex and and with tramadol, without relief. No other associated symptoms. Past Medical History  Diagnosis Date  . Hyperlipidemia   . HYPOTHYROIDISM 01/30/2007  . 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/2007  . ASTHMA 01/30/2007  . GERD 01/31/2007  . Hiatal hernia   . [place] tear 04/2003  . Hemorrhoids   . HTN (hypertension) 09/09/2011   Past Surgical History  Procedure Laterality Date  . Abdominal hysterectomy    . Tubal ligation  1987  . Broken jaw    . Breast reduction surgery  1986  . Carpal tunnel release  2001  . Thyroidectomy     Family History  Problem Relation Age of Onset  . Hypertension Father   . Diabetes Father   . Breast cancer Sister   . Colon cancer Neg Hx   . Diabetes Maternal Grandmother   . Heart disease Maternal Grandmother   . Asthma Mother   . Asthma Maternal Grandfather    History  Substance Use Topics  . Smoking status: Former Smoker -- 0.25 packs/day for 3 years    Types: Cigarettes    Quit date: 06/04/1980  . Smokeless tobacco: Never Used  . Alcohol  Use: No   OB History    No data available     Review of Systems  Respiratory: Positive for shortness of breath.   Gastrointestinal: Positive for abdominal pain.       Abdominal pain has resolved  Genitourinary:       Amenorrheic for 2 years  Musculoskeletal: Positive for back pain.  All other systems reviewed and are negative.     Allergies  Codeine and Flonase  Home Medications   Prior to Admission medications   Medication Sig Start Date End Date Taking? Authorizing Provider  acetaminophen (TYLENOL) 325 MG tablet Take 325 mg by mouth every 6 (six) hours as needed for moderate pain.    Historical Provider, MD  albuterol (PROVENTIL HFA;VENTOLIN HFA) 108 (90 BASE) MCG/ACT inhaler Inhale 2 puffs into the lungs every 4 (four) hours as needed for wheezing or shortness of breath. 04/07/14   Biagio Borg, MD  amLODipine (NORVASC) 10 MG tablet TAKE ONE TABLET BY MOUTH ONCE DAILY 07/20/14   Biagio Borg, MD  atorvastatin (LIPITOR) 20 MG tablet Take 1 tablet (20 mg total) by mouth daily. 04/14/14 04/14/15  Biagio Borg, MD  azithromycin (ZITHROMAX Z-PAK) 250 MG tablet Use as directed Patient not taking: Reported on 09/21/2014 05/05/14   Biagio Borg, MD  cephALEXin (KEFLEX) 500 MG capsule Take 1 capsule (500 mg total) by mouth 4 (four) times daily. 09/21/14   Nat Christen, MD  EPINEPHrine (EPIPEN 2-PAK) 0.3 mg/0.3 mL IJ SOAJ injection Inject 0.3 mLs (0.3 mg total) into the muscle once as needed (for severe allergic reaction). CAll 911 immediately if you have to use this medicine Patient not taking: Reported on 09/21/2014 05/05/14   Biagio Borg, MD  hydrochlorothiazide (MICROZIDE) 12.5 MG capsule Take 1 capsule (12.5 mg total) by mouth daily. As needed for swelling 04/07/14   Biagio Borg, MD  hydroxypropyl methylcellulose / hypromellose (ISOPTO TEARS / GONIOVISC) 2.5 % ophthalmic solution Place 1 drop into both eyes at bedtime.    Historical Provider, MD  ibuprofen (ADVIL,MOTRIN) 200 MG tablet Take  200 mg by mouth every 6 (six) hours as needed for moderate pain.    Historical Provider, MD  levothyroxine (SYNTHROID, LEVOTHROID) 125 MCG tablet Take 1 tablet (125 mcg total) by mouth daily before breakfast. 04/07/14   Biagio Borg, MD  loratadine (CLARITIN) 10 MG tablet Take 10 mg by mouth daily as needed for allergies.    Historical Provider, MD  Multiple Vitamin (MULTIVITAMIN WITH MINERALS) TABS tablet Take 1 tablet by mouth daily.    Historical Provider, MD  phenazopyridine (PYRIDIUM) 100 MG tablet Take 1 tablet (100 mg total) by mouth 3 (three) times daily as needed for pain. 09/24/14   Biagio Borg, MD  phentermine 37.5 MG capsule Take 1 capsule (37.5 mg total) by mouth every morning. Patient not taking: Reported on 09/21/2014 04/07/14   Biagio Borg, MD  promethazine (PHENERGAN) 25 MG tablet Take 1 tablet (25 mg total) by mouth every 6 (six) hours as needed for nausea. 09/22/14   Nat Christen, MD  traMADol (ULTRAM) 50 MG tablet Take 1 tablet (50 mg total) by mouth every 6 (six) hours as needed. 09/22/14   Nat Christen, MD  triamcinolone (NASACORT AQ) 55 MCG/ACT AERO nasal inhaler Place 2 sprays into the nose daily. Patient taking differently: Place 2 sprays into the nose 2 (two) times daily as needed (allergies).  05/05/14   Biagio Borg, MD   BP 131/79 mmHg  Pulse 86  Temp(Src) 98.3 F (36.8 C) (Oral)  Resp 20  Ht 5\' 4"  (1.626 m)  Wt 197 lb 9 oz (89.614 kg)  BMI 33.89 kg/m2  SpO2 99% Physical Exam  Constitutional: She is oriented to person, place, and time. She appears well-developed and well-nourished. No distress.  HENT:  Head: Normocephalic and atraumatic.  Eyes: Conjunctivae are normal. Pupils are equal, round, and reactive to light.  Neck: Neck supple. No tracheal deviation present. No thyromegaly present.  Cardiovascular: Normal rate and regular rhythm.   No murmur heard. Pulmonary/Chest: Effort normal and breath sounds normal.  Abdominal: Soft. Bowel sounds are normal. She exhibits  no distension. There is no tenderness.  Obese  Musculoskeletal: Normal range of motion. She exhibits no edema or tenderness.  Right-sided paralumbar tenderness. Pain is exacerbated when she sits up from a supine position  Neurological: She is alert and oriented to person, place, and time. She has normal reflexes. No cranial nerve deficit. Coordination normal.  DTRs symmetric bilaterally at knee jerk ankle jerk and biceps toes downward going bilaterally  Skin: Skin is warm and dry. No rash noted.  Psychiatric: She has a normal mood and affect.  Nursing note and vitals reviewed.   ED Course  Procedures (including critical care time) Labs Review Labs Reviewed  BASIC METABOLIC PANEL - Abnormal; Notable for the following:    Potassium 3.4 (*)    Glucose, Bld 114 (*)  GFR calc non Af Amer 80 (*)    All other components within normal limits  CBC  BRAIN NATRIURETIC PEPTIDE  URINALYSIS, ROUTINE W REFLEX MICROSCOPIC  I-STAT TROPOININ, ED    Imaging Review Dg Chest 2 View  09/28/2014   CLINICAL DATA:  Abdominal pain and back pain.  Shortness of breath  EXAM: CHEST  2 VIEW  COMPARISON:  06/22/2014  FINDINGS: Normal heart size and mediastinal contours. No acute infiltrate or edema. No effusion or pneumothorax.  Appearance of the right scapular acromion is likely due to positioning. No acute osseous findings.  IMPRESSION: No active cardiopulmonary disease.   Electronically Signed   By: Monte Fantasia M.D.   On: 09/28/2014 16:30     EKG Interpretation   Date/Time:  Tuesday September 28 2014 15:25:58 EDT Ventricular Rate:  112 PR Interval:  192 QRS Duration: 66 QT Interval:  328 QTC Calculation: 447 R Axis:   66 Text Interpretation:  Sinus tachycardia Cannot rule out Anterior infarct ,  age undetermined Abnormal ECG No significant change since last tracing  Confirmed by Winfred Leeds  MD, Kerra Guilfoil [phone]) on 09/28/2014 5:26:48 PM     Chest x-ray viewed by me Results for orders placed or  performed during the hospital encounter of 09/28/14  CBC  Result Value Ref Range   WBC 5.5 4.0 - 10.5 K/uL   RBC 4.48 3.87 - 5.11 MIL/uL   Hemoglobin 12.6 12.0 - 15.0 g/dL   HCT 37.1 36.0 - 46.0 %   MCV 82.8 78.0 - 100.0 fL   MCH 28.1 26.0 - 34.0 pg   MCHC 34.0 30.0 - 36.0 g/dL   RDW 12.2 11.5 - 15.5 %   Platelets 228 150 - 400 K/uL  Basic metabolic panel  Result Value Ref Range   Sodium 137 135 - 145 mmol/L   Potassium 3.4 (L) 3.5 - 5.1 mmol/L   Chloride 101 96 - 112 mmol/L   CO2 24 19 - 32 mmol/L   Glucose, Bld 114 (H) 70 - 99 mg/dL   BUN 11 6 - 23 mg/dL   Creatinine, Ser 0.84 0.50 - 1.10 mg/dL   Calcium 8.7 8.4 - 10.5 mg/dL   GFR calc non Af Amer 80 (L) >90 mL/min   GFR calc Af Amer >90 >90 mL/min   Anion gap 12 5 - 15  BNP (order ONLY if patient complains of dyspnea/SOB AND you have documented it for THIS visit)  Result Value Ref Range   B Natriuretic Peptide 9.7 0.0 - 100.0 pg/mL  Urinalysis, Routine w reflex microscopic  Result Value Ref Range   Color, Urine YELLOW YELLOW   APPearance CLEAR CLEAR   Specific Gravity, Urine 1.007 1.005 - 1.030   pH 6.0 5.0 - 8.0   Glucose, UA NEGATIVE NEGATIVE mg/dL   Hgb urine dipstick NEGATIVE NEGATIVE   Bilirubin Urine NEGATIVE NEGATIVE   Ketones, ur NEGATIVE NEGATIVE mg/dL   Protein, ur NEGATIVE NEGATIVE mg/dL   Urobilinogen, UA 0.2 0.0 - 1.0 mg/dL   Nitrite NEGATIVE NEGATIVE   Leukocytes, UA NEGATIVE NEGATIVE  I-stat troponin, ED (not at [facility])  Result Value Ref Range   Troponin i, poc 0.00 0.00 - 0.08 ng/mL   Comment 3           Dg Chest 2 View  09/28/2014   CLINICAL DATA:  Abdominal pain and back pain.  Shortness of breath  EXAM: CHEST  2 VIEW  COMPARISON:  06/22/2014  FINDINGS: Normal heart size and mediastinal contours. No  acute infiltrate or edema. No effusion or pneumothorax.  Appearance of the right scapular acromion is likely due to positioning. No acute osseous findings.  IMPRESSION: No active cardiopulmonary disease.    Electronically Signed   By: Monte Fantasia M.D.   On: 09/28/2014 16:30   Ct Abdomen Pelvis W Contrast  09/21/2014   CLINICAL DATA:  Abdominal pain for 3-4 days.  EXAM: CT ABDOMEN AND PELVIS WITH CONTRAST  TECHNIQUE: Multidetector CT imaging of the abdomen and pelvis was performed using the standard protocol following bolus administration of intravenous contrast.  CONTRAST:  121mL OMNIPAQUE IOHEXOL 300 MG/ML  SOLN  COMPARISON:  11/15/2004  FINDINGS: BODY WALL: There is a small nodule in the lower right breast, correlating with sonographic report 06/11/2014. Followup has already been recommended.  LOWER CHEST: Negative.  ABDOMEN/PELVIS:  Liver: No focal abnormality.  Biliary: No evidence of biliary obstruction or stone.  Pancreas: Unremarkable.  Spleen: Unremarkable.  Adrenals: Unremarkable.  Kidneys and ureters: No hydronephrosis or stone.  Bladder: Unremarkable.  Reproductive: Multiple enhancing myometrial masses, distorting the endometrium and measuring up to 6 cm in the left body. These have progressed in size/ number since 2006. Negative ovaries. There are varicosities of the bilateral lower ovarian veins.  Bowel: No obstruction. Normal appendix.  Retroperitoneum: Chronic and stable prominence of ileocolic lymph nodes.  Peritoneum: No ascites or pneumoperitoneum.  OSSEOUS: No acute abnormalities.  IMPRESSION: 1. No explanation for acute abdominal pain. 2. Multi fibroid uterus.   Electronically Signed   By: Monte Fantasia M.D.   On: 09/21/2014 23:33   7 PM pain improved after treatment with Norco Results for orders placed or performed during the hospital encounter of 09/28/14  CBC  Result Value Ref Range   WBC 5.5 4.0 - 10.5 K/uL   RBC 4.48 3.87 - 5.11 MIL/uL   Hemoglobin 12.6 12.0 - 15.0 g/dL   HCT 37.1 36.0 - 46.0 %   MCV 82.8 78.0 - 100.0 fL   MCH 28.1 26.0 - 34.0 pg   MCHC 34.0 30.0 - 36.0 g/dL   RDW 12.2 11.5 - 15.5 %   Platelets 228 150 - 400 K/uL  Basic metabolic panel  Result Value Ref  Range   Sodium 137 135 - 145 mmol/L   Potassium 3.4 (L) 3.5 - 5.1 mmol/L   Chloride 101 96 - 112 mmol/L   CO2 24 19 - 32 mmol/L   Glucose, Bld 114 (H) 70 - 99 mg/dL   BUN 11 6 - 23 mg/dL   Creatinine, Ser 0.84 0.50 - 1.10 mg/dL   Calcium 8.7 8.4 - 10.5 mg/dL   GFR calc non Af Amer 80 (L) >90 mL/min   GFR calc Af Amer >90 >90 mL/min   Anion gap 12 5 - 15  BNP (order ONLY if patient complains of dyspnea/SOB AND you have documented it for THIS visit)  Result Value Ref Range   B Natriuretic Peptide 9.7 0.0 - 100.0 pg/mL  Urinalysis, Routine w reflex microscopic  Result Value Ref Range   Color, Urine YELLOW YELLOW   APPearance CLEAR CLEAR   Specific Gravity, Urine 1.007 1.005 - 1.030   pH 6.0 5.0 - 8.0   Glucose, UA NEGATIVE NEGATIVE mg/dL   Hgb urine dipstick NEGATIVE NEGATIVE   Bilirubin Urine NEGATIVE NEGATIVE   Ketones, ur NEGATIVE NEGATIVE mg/dL   Protein, ur NEGATIVE NEGATIVE mg/dL   Urobilinogen, UA 0.2 0.0 - 1.0 mg/dL   Nitrite NEGATIVE NEGATIVE   Leukocytes, UA NEGATIVE NEGATIVE  I-stat troponin, ED (not at [facility])  Result Value Ref Range   Troponin i, poc 0.00 0.00 - 0.08 ng/mL   Comment 3           Dg Chest 2 View  09/28/2014   CLINICAL DATA:  Abdominal pain and back pain.  Shortness of breath  EXAM: CHEST  2 VIEW  COMPARISON:  06/22/2014  FINDINGS: Normal heart size and mediastinal contours. No acute infiltrate or edema. No effusion or pneumothorax.  Appearance of the right scapular acromion is likely due to positioning. No acute osseous findings.  IMPRESSION: No active cardiopulmonary disease.   Electronically Signed   By: Monte Fantasia M.D.   On: 09/28/2014 16:30   Ct Abdomen Pelvis W Contrast  09/21/2014   CLINICAL DATA:  Abdominal pain for 3-4 days.  EXAM: CT ABDOMEN AND PELVIS WITH CONTRAST  TECHNIQUE: Multidetector CT imaging of the abdomen and pelvis was performed using the standard protocol following bolus administration of intravenous contrast.  CONTRAST:  131mL  OMNIPAQUE IOHEXOL 300 MG/ML  SOLN  COMPARISON:  11/15/2004  FINDINGS: BODY WALL: There is a small nodule in the lower right breast, correlating with sonographic report 06/11/2014. Followup has already been recommended.  LOWER CHEST: Negative.  ABDOMEN/PELVIS:  Liver: No focal abnormality.  Biliary: No evidence of biliary obstruction or stone.  Pancreas: Unremarkable.  Spleen: Unremarkable.  Adrenals: Unremarkable.  Kidneys and ureters: No hydronephrosis or stone.  Bladder: Unremarkable.  Reproductive: Multiple enhancing myometrial masses, distorting the endometrium and measuring up to 6 cm in the left body. These have progressed in size/ number since 2006. Negative ovaries. There are varicosities of the bilateral lower ovarian veins.  Bowel: No obstruction. Normal appendix.  Retroperitoneum: Chronic and stable prominence of ileocolic lymph nodes.  Peritoneum: No ascites or pneumoperitoneum.  OSSEOUS: No acute abnormalities.  IMPRESSION: 1. No explanation for acute abdominal pain. 2. Multi fibroid uterus.   Electronically Signed   By: Monte Fantasia M.D.   On: 09/21/2014 23:33    MDM  Pain felt to be muscular in etiology. Dyspnea is subjective. Patient has normal respiratory rate clear lung exam normal pulse oximetry. Plan prescription Norco Discontinue Keflex Follow-up Dr. [name] Diagnosis #1 low back pain #2 dyspnea #3 hypokalemia Final diagnoses:  None        Orlie Dakin, MD 09/28/14 1907

## 2014-09-29 ENCOUNTER — Ambulatory Visit (HOSPITAL_BASED_OUTPATIENT_CLINIC_OR_DEPARTMENT_OTHER): Payer: BC Managed Care – PPO | Admitting: Oncology

## 2014-09-29 ENCOUNTER — Telehealth: Payer: Self-pay | Admitting: Oncology

## 2014-09-29 ENCOUNTER — Other Ambulatory Visit: Payer: BC Managed Care – PPO

## 2014-09-29 VITALS — BP 110/50 | HR 103 | Temp 98.2°F | Resp 16 | Ht 64.0 in | Wt 197.6 lb

## 2014-09-29 DIAGNOSIS — I10 Essential (primary) hypertension: Secondary | ICD-10-CM

## 2014-09-29 DIAGNOSIS — R109 Unspecified abdominal pain: Secondary | ICD-10-CM | POA: Diagnosis not present

## 2014-09-29 DIAGNOSIS — D500 Iron deficiency anemia secondary to blood loss (chronic): Secondary | ICD-10-CM

## 2014-09-29 DIAGNOSIS — N920 Excessive and frequent menstruation with regular cycle: Secondary | ICD-10-CM

## 2014-09-29 NOTE — Progress Notes (Signed)
[place] NOTE   Marilyn Cower, MD [place] [address]  DIAGNOSIS: 49 year old woman with iron deficiency anemia dating back to 2012.  CURRENT THERAPY:The patient has received Feraheme 510 mg IV on 11/10/2010, 11/17/2010, 08/01/2011 and 08/08/2011. She is symptomatic with Iron deficiency and will get a Feraheme infusion today on 04/01/2014.     INTERVAL HISTORY:  Marilyn [name] 49 y.o. female presents today for a follow-up visit. Since the last visit, she had not reported any menstrual blood losses. She has not reported any hematochezia or melena. She did experience right-sided flank pain for the last 3 weeks. She had an extensive workup including laboratory testing as well as CT scan of the abdomen and pelvis as well as a chest x-ray which were unrevealing. Her pain is described as constant right-sided flank pain with radiation sometimes to the outside of the thigh and into the groin. She does not report any fevers chills or sweats. Her appetite is excellent has not lost weight. She does not report any headaches him a syncope or seizures. She does not report any chest pain or palpitation. She does not report any nausea, vomiting, abdominal pain. She does not report any frequency urgency or hesitancy. She does not report any skeletal complaints. Remaining review of systems unremarkable.  MEDICAL HISTORY: Past Medical History  Diagnosis Date  . Hyperlipidemia   . HYPOTHYROIDISM 01/30/2007  . 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/2007  . ASTHMA 01/30/2007  . GERD 01/31/2007  . Hiatal hernia   . [place] tear 04/2003  . Hemorrhoids   . HTN (hypertension) 09/09/2011    I ALLERGIES:  is allergic to codeine and flonase.  MEDICATIONS:   Current Outpatient Prescriptions  Medication Sig Dispense Refill  . acetaminophen (TYLENOL) 325 MG tablet Take 325 mg by mouth every 6 (six) hours as needed for moderate pain.    [name] albuterol  (PROVENTIL HFA;VENTOLIN HFA) 108 (90 BASE) MCG/ACT inhaler Inhale 2 puffs into the lungs every 4 (four) hours as needed for wheezing or shortness of breath. 18 g 11  . amLODipine (NORVASC) 10 MG tablet TAKE ONE TABLET BY MOUTH ONCE DAILY 90 tablet 3  . atorvastatin (LIPITOR) 20 MG tablet Take 1 tablet (20 mg total) by mouth daily. 90 tablet 3  . azithromycin (ZITHROMAX Z-PAK) 250 MG tablet Use as directed 6 tablet 1  . cephALEXin (KEFLEX) 500 MG capsule Take 1 capsule (500 mg total) by mouth 4 (four) times daily. 28 capsule 0  . EPINEPHrine (EPIPEN 2-PAK) 0.3 mg/0.3 mL IJ SOAJ injection Inject 0.3 mLs (0.3 mg total) into the muscle once as needed (for severe allergic reaction). CAll 911 immediately if you have to use this medicine 1 Device 1  . hydrochlorothiazide (MICROZIDE) 12.5 MG capsule Take 1 capsule (12.5 mg total) by mouth daily. As needed for swelling 90 capsule 3  . HYDROcodone-acetaminophen (NORCO) 5-325 MG per tablet Take 1-2 tablets by mouth every 6 (six) hours as needed. 16 tablet 0  . hydroxypropyl methylcellulose / hypromellose (ISOPTO TEARS / GONIOVISC) 2.5 % ophthalmic solution Place 1 drop into both eyes at bedtime.    [name] ibuprofen (ADVIL,MOTRIN) 200 MG tablet Take 200 mg by mouth every 6 (six) hours as needed for moderate pain.    [name] levothyroxine (SYNTHROID, LEVOTHROID) 125 MCG tablet Take 1 tablet (125 mcg total) by mouth daily before breakfast. 90 tablet 3  . loratadine (CLARITIN) 10 MG tablet Take 10 mg by mouth  daily as needed for allergies.    . Multiple Vitamin (MULTIVITAMIN WITH MINERALS) TABS tablet Take 1 tablet by mouth daily.    . phenazopyridine (PYRIDIUM) 100 MG tablet Take 1 tablet (100 mg total) by mouth 3 (three) times daily as needed for pain. 15 tablet 0  . phentermine 37.5 MG capsule Take 1 capsule (37.5 mg total) by mouth every morning. 30 capsule 2  . promethazine (PHENERGAN) 25 MG tablet Take 1 tablet (25 mg total) by mouth every 6 (six) hours as needed for  nausea. 15 tablet 0  . traMADol (ULTRAM) 50 MG tablet Take 1 tablet (50 mg total) by mouth every 6 (six) hours as needed. 20 tablet 0  . triamcinolone (NASACORT AQ) 55 MCG/ACT AERO nasal inhaler Place 2 sprays into the nose daily. (Patient taking differently: Place 2 sprays into the nose 2 (two) times daily as needed (allergies). ) 1 Inhaler 12   No current facility-administered medications for this visit.    SURGICAL HISTORY:  Past Surgical History  Procedure Laterality Date  . Abdominal hysterectomy    . Tubal ligation  1987  . Broken jaw    . Breast reduction surgery  1986  . Carpal tunnel release  2001  . Thyroidectomy      PHYSICAL EXAMINATION: ECOG PERFORMANCE STATUS: 0  Blood pressure 110/50, pulse 103, temperature 98.2 F (36.8 C), temperature source Oral, resp. rate 16, height 5\' 4"  (1.626 m), weight 197 lb 9.6 oz (89.631 kg), SpO2 99 %.  GENERAL:alert, no distress and comfortable; obese. SKIN: skin color, texture, turgor are normal, no rashes or significant lesions EYES: normal, Conjunctiva are pink and non-injected, sclera clear OROPHARYNX:no exudate, no erythema and lips, buccal mucosa, and tongue normal  NECK: supple, thyroid normal size, non-tender, without nodularity LYMPH:  no palpable lymphadenopathy in the cervical, axillary or supraclavicular LUNGS: clear to auscultation and percussion with normal breathing effort HEART: regular rate & rhythm and no murmurs and no lower extremity edema ABDOMEN:abdomen soft, non-tender and normal bowel sounds Musculoskeletal:no cyanosis of digits and no clubbing  NEURO: alert & oriented x 3 with fluent speech, no focal motor/sensory deficits  CBC    Component Value Date/Time   WBC 5.5 09/28/2014 1528   WBC 5.0 04/01/2014 1415   WBC 4.9 07/12/2013 1113   RBC 4.48 09/28/2014 1528   RBC 4.22 04/01/2014 1415   RBC 5.03 07/12/2013 1113   HGB 12.6 09/28/2014 1528   HGB 11.1* 04/01/2014 1415   HGB 13.4 07/12/2013 1113   HCT  37.1 09/28/2014 1528   HCT 34.8 04/01/2014 1415   HCT 43.3 07/12/2013 1113   PLT 228 09/28/2014 1528   PLT 276 04/01/2014 1415   MCV 82.8 09/28/2014 1528   MCV 82.4 04/01/2014 1415   MCV 86.0 07/12/2013 1113   MCH 28.1 09/28/2014 1528   MCH 26.4 04/01/2014 1415   MCH 26.6* 07/12/2013 1113   MCHC 34.0 09/28/2014 1528   MCHC 32.0 04/01/2014 1415   MCHC 30.9* 07/12/2013 1113   RDW 12.2 09/28/2014 1528   RDW 13.9 04/01/2014 1415   LYMPHSABS 2.9 09/21/2014 2051   LYMPHSABS 2.5 04/01/2014 1415   MONOABS 0.4 09/21/2014 2051   MONOABS 0.2 04/01/2014 1415   EOSABS 0.1 09/21/2014 2051   EOSABS 0.1 04/01/2014 1415   BASOSABS 0.0 09/21/2014 2051   BASOSABS 0.0 04/01/2014 1415      Chemistry      Component Value Date/Time   NA 137 09/28/2014 1528   NA 141 04/01/2014 1415  K 3.4* 09/28/2014 1528   K 3.6 04/01/2014 1415   CL 101 09/28/2014 1528   CL 104 08/12/2012 1329   CO2 24 09/28/2014 1528   CO2 27 04/01/2014 1415   BUN 11 09/28/2014 1528   BUN 10.8 04/01/2014 1415   CREATININE 0.84 09/28/2014 1528   CREATININE 0.9 04/01/2014 1415      Component Value Date/Time   CALCIUM 8.7 09/28/2014 1528   CALCIUM 8.2* 04/01/2014 1415   ALKPHOS 88 09/21/2014 2051   ALKPHOS 82 10/01/2013 1338   AST 27 09/21/2014 2051   AST 18 10/01/2013 1338   ALT 31 09/21/2014 2051   ALT 14 10/01/2013 1338   BILITOT 0.3 09/21/2014 2051   BILITOT 0.20 10/01/2013 1338     EXAM: CT ABDOMEN AND PELVIS WITH CONTRAST  TECHNIQUE: Multidetector CT imaging of the abdomen and pelvis was performed using the standard protocol following bolus administration of intravenous contrast.  CONTRAST: 115mL OMNIPAQUE IOHEXOL 300 MG/ML SOLN  COMPARISON: 11/15/2004  FINDINGS: BODY WALL: There is a small nodule in the lower right breast, correlating with sonographic report 06/11/2014. Followup has already been recommended.  LOWER CHEST: Negative.  ABDOMEN/PELVIS:  Liver: No focal  abnormality.  Biliary: No evidence of biliary obstruction or stone.  Pancreas: Unremarkable.  Spleen: Unremarkable.  Adrenals: Unremarkable.  Kidneys and ureters: No hydronephrosis or stone.  Bladder: Unremarkable.  Reproductive: Multiple enhancing myometrial masses, distorting the endometrium and measuring up to 6 cm in the left body. These have progressed in size/ number since 2006. Negative ovaries. There are varicosities of the bilateral lower ovarian veins.  Bowel: No obstruction. Normal appendix.  Retroperitoneum: Chronic and stable prominence of ileocolic lymph nodes.  Peritoneum: No ascites or pneumoperitoneum.  OSSEOUS: No acute abnormalities.  IMPRESSION: 1. No explanation for acute abdominal pain. 2. Multi fibroid uterus.  EXAM: CT ANGIOGRAPHY CHEST WITH CONTRAST  TECHNIQUE: Multidetector CT imaging of the chest was performed using the standard protocol during bolus administration of intravenous contrast. Multiplanar CT image reconstructions and MIPs were obtained to evaluate the vascular anatomy.  CONTRAST: 68mL OMNIPAQUE IOHEXOL 350 MG/ML SOLN  COMPARISON: Current chest radiograph chest CT, 04/03/2010.  FINDINGS: No evidence of a pulmonary embolus. Heart borderline enlarged. Great vessels are normal in caliber. No aortic dissection.  No neck base, axillary, mediastinal or hilar masses or pathologically enlarged lymph nodes.  Minor subsegmental atelectasis in the dependent lower lobes. No lung consolidation or edema. No pleural effusion.  Limited evaluation of the upper abdomen is unremarkable.  Degenerate changes noted of the thoracic spine most evident at T6 and T7 where there is significant endplate sclerosis, irregularity and loss of disc height. No osteoblastic or osteolytic lesions.  Review of the MIP images confirms the above findings.  IMPRESSION: 1. No evidence of a pulmonary embolus. 2. No acute  findings.   ASSESSMENT: 49 year old woman with the following issues.  PLAN:  1. Iron deficiency anemia secondary to menstruation, now resolving .  -- Her hemoglobin is 12.6 without any evidence of active bleeding. I see no reason for any IV iron replacement at this time and we'll continue to follow her counts closely to she is completely menopausal.  2. Right-sided flank pain: Unclear etiology imaging studies did not show any evidence to suggest malignancy. I recommended a follow-up with her prior care physician regarding this issue.  3. HTN. Managed by primary care physician.

## 2014-09-29 NOTE — Telephone Encounter (Signed)
Pt confirmed labs/ov per 04/26 POF, gave pt AVS and Calendar...  KJ °

## 2014-09-30 ENCOUNTER — Other Ambulatory Visit: Payer: BC Managed Care – PPO

## 2014-09-30 ENCOUNTER — Ambulatory Visit: Payer: BC Managed Care – PPO

## 2014-11-10 ENCOUNTER — Emergency Department (HOSPITAL_COMMUNITY): Payer: BC Managed Care – PPO

## 2014-11-10 ENCOUNTER — Emergency Department (HOSPITAL_COMMUNITY)
Admission: EM | Admit: 2014-11-10 | Discharge: 2014-11-10 | Disposition: A | Discharge status: Home / Self Care | Payer: BC Managed Care – PPO | Attending: Emergency Medicine | Admitting: Emergency Medicine

## 2014-11-10 ENCOUNTER — Encounter (HOSPITAL_COMMUNITY): Payer: Self-pay | Admitting: Emergency Medicine

## 2014-11-10 DIAGNOSIS — E785 Hyperlipidemia, unspecified: Secondary | ICD-10-CM | POA: Diagnosis not present

## 2014-11-10 DIAGNOSIS — J45901 Unspecified asthma with (acute) exacerbation: Secondary | ICD-10-CM | POA: Diagnosis not present

## 2014-11-10 DIAGNOSIS — I10 Essential (primary) hypertension: Secondary | ICD-10-CM | POA: Diagnosis not present

## 2014-11-10 DIAGNOSIS — R220 Localized swelling, mass and lump, head: Secondary | ICD-10-CM | POA: Diagnosis present

## 2014-11-10 DIAGNOSIS — E039 Hypothyroidism, unspecified: Secondary | ICD-10-CM | POA: Diagnosis not present

## 2014-11-10 DIAGNOSIS — Z862 Personal history of diseases of the blood and blood-forming organs and certain disorders involving the immune mechanism: Secondary | ICD-10-CM | POA: Insufficient documentation

## 2014-11-10 DIAGNOSIS — Z87891 Personal history of nicotine dependence: Secondary | ICD-10-CM | POA: Diagnosis not present

## 2014-11-10 DIAGNOSIS — Z79899 Other long term (current) drug therapy: Secondary | ICD-10-CM | POA: Diagnosis not present

## 2014-11-10 DIAGNOSIS — Z8719 Personal history of other diseases of the digestive system: Secondary | ICD-10-CM | POA: Diagnosis not present

## 2014-11-10 DIAGNOSIS — R0602 Shortness of breath: Secondary | ICD-10-CM

## 2014-11-10 DIAGNOSIS — R221 Localized swelling, mass and lump, neck: Secondary | ICD-10-CM

## 2014-11-10 MED ORDER — METHYLPREDNISOLONE SODIUM SUCC 125 MG IJ SOLR
INTRAMUSCULAR | Status: AC
  Filled 2014-11-10: qty 2

## 2014-11-10 MED ORDER — IPRATROPIUM-ALBUTEROL 0.5-2.5 (3) MG/3ML IN SOLN: 3.0000 mL | RESPIRATORY_TRACT | Status: DC

## 2014-11-10 MED ORDER — PREDNISONE 20 MG PO TABS: 60.0000 mg | ORAL_TABLET | Freq: Every day | ORAL | Status: DC

## 2014-11-10 MED ORDER — IPRATROPIUM-ALBUTEROL 0.5-2.5 (3) MG/3ML IN SOLN
3.0000 mL | Freq: Once | RESPIRATORY_TRACT | Status: AC
Start: 2014-11-10 — End: 2014-11-10
  Administered 2014-11-10: 3 mL via RESPIRATORY_TRACT
  Filled 2014-11-10: qty 3

## 2014-11-10 MED ORDER — EPINEPHRINE 0.3 MG/0.3ML IJ SOAJ
INTRAMUSCULAR | Status: AC
  Filled 2014-11-10: qty 0.3

## 2014-11-10 MED ORDER — EPINEPHRINE 0.3 MG/0.3ML IJ SOAJ: 0.3000 mg | Freq: Once | INTRAMUSCULAR | Status: DC

## 2014-11-10 NOTE — ED Notes (Signed)
Pt states she woke up about midnight having a hard time breathing and felt like her throat was closing up  Pt states her uvula is swollen

## 2014-11-10 NOTE — Discharge Instructions (Signed)
Anaphylactic Reaction Ms. Braileigh, Landenberger were treated for an allergic reaction. If this occurs again, you can use your EpiPen and come to the emergency Department immediately. Continue to take steroids for the next 4 days. See your primary care physician within 3 days for close follow-up. For any concerns, back to the ED immediately. Thank you. An anaphylactic reaction is a sudden, severe allergic reaction. It affects the whole body. It can be life threatening. You may need to stay in the hospital.  [place] a medical bracelet or necklace that lists your allergy.  Carry your allergy kit or medicine shot to treat severe allergic reactions with you. These can save your life.  Do not drive until medicine from your shot has worn off, unless your doctor says it is okay.  If you have hives or a rash:  Take medicine as told by your doctor.  You may take over-the-counter antihistamine medicine.  Place cold cloths on your skin. Take baths in cool water. Avoid hot baths and hot showers. GET HELP RIGHT AWAY IF:   Your mouth is puffy (swollen), or you have trouble breathing.  You start making whistling sounds when you breathe (wheezing).  You have a tight feeling in your chest or throat.  You have a rash, hives, puffiness, or itching on your body.  You throw up (vomit) or have watery poop (diarrhea).  You feel dizzy or pass out (faint).  You think you are having an allergic reaction.  You have new symptoms. This is an emergency. Use your medicine shot or allergy kit as told. Call your local emergency services (911 in [place] Even if you feel better after the shot, you need to go to the hospital emergency department. MAKE SURE YOU:   Understand these instructions.  Will watch your condition.  Will get help right away if you are not doing well or get worse. Document Released: 11/07/2007 Document Revised: 11/20/2011 Document Reviewed: 08/22/2011 [facility] Patient Information 2015  [place], [place]. This information is not intended to replace advice given to you by your health care provider. Make sure you discuss any questions you have with your health care provider.

## 2014-11-10 NOTE — ED Provider Notes (Signed)
CSN: [id number]     Arrival date & time 11/10/14  0014 History   First MD Initiated Contact with Patient 11/10/14 [phone]     Chief Complaint  Patient presents with  . uvula swelling      (Consider location/radiation/quality/duration/timing/severity/associated sxs/prior Treatment) HPI  Marilyn [name] is a 49 year old female with past medical history of GERD, anemia, [place] tear presenting today with difficulty breathing and throat swelling. She states she woke up in the middle the night feeling this way. She drove herself here for evaluation. She had episodes tonight which has had many times before. She has some mild difficulty breathing, she was hydroxypropyl she can't swallow it down. She denies any choking episodes. She has no rash or pruritus. She does not believe this is acute allergic reaction. She has no further complaints.  10 Systems reviewed and are negative for acute change except as noted in the HPI.    Past Medical History  Diagnosis Date  . Hyperlipidemia   . HYPOTHYROIDISM 01/30/2007  . 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/2007  . ASTHMA 01/30/2007  . GERD 01/31/2007  . Hiatal hernia   . [place] tear 04/2003  . Hemorrhoids   . HTN (hypertension) 09/09/2011   Past Surgical History  Procedure Laterality Date  . Abdominal hysterectomy    . Tubal ligation  1987  . Broken jaw    . Breast reduction surgery  1986  . Carpal tunnel release  2001  . Thyroidectomy     Family History  Problem Relation Age of Onset  . Hypertension Father   . Diabetes Father   . Breast cancer Sister   . Colon cancer Neg Hx   . Diabetes Maternal Grandmother   . Heart disease Maternal Grandmother   . Asthma Mother   . Asthma Maternal Grandfather    History  Substance Use Topics  . Smoking status: Former Smoker -- 0.25 packs/day for 3 years    Types: Cigarettes    Quit date: 06/04/1980  . Smokeless tobacco: Never Used  . Alcohol Use: No   OB History    No data available     Review  of Systems    Allergies  Codeine and Flonase  Home Medications   Prior to Admission medications   Medication Sig Start Date End Date Taking? Authorizing Provider  acetaminophen (TYLENOL) 325 MG tablet Take 325 mg by mouth every 6 (six) hours as needed for moderate pain.    Historical Provider, MD  albuterol (PROVENTIL HFA;VENTOLIN HFA) 108 (90 BASE) MCG/ACT inhaler Inhale 2 puffs into the lungs every 4 (four) hours as needed for wheezing or shortness of breath. 04/07/14   Biagio Borg, MD  amLODipine (NORVASC) 10 MG tablet TAKE ONE TABLET BY MOUTH ONCE DAILY 07/20/14   Biagio Borg, MD  atorvastatin (LIPITOR) 20 MG tablet Take 1 tablet (20 mg total) by mouth daily. 04/14/14 04/14/15  Biagio Borg, MD  azithromycin (ZITHROMAX Z-PAK) 250 MG tablet Use as directed 05/05/14   Biagio Borg, MD  cephALEXin (KEFLEX) 500 MG capsule Take 1 capsule (500 mg total) by mouth 4 (four) times daily. 09/21/14   Nat Christen, MD  EPINEPHrine (EPIPEN 2-PAK) 0.3 mg/0.3 mL IJ SOAJ injection Inject 0.3 mLs (0.3 mg total) into the muscle once as needed (for severe allergic reaction). CAll 911 immediately if you have to use this medicine 05/05/14   Biagio Borg, MD  hydrochlorothiazide (MICROZIDE) 12.5 MG capsule Take 1 capsule (12.5 mg total) by mouth daily. As  needed for swelling 04/07/14   Biagio Borg, MD  HYDROcodone-acetaminophen [facility]) 5-325 MG per tablet Take 1-2 tablets by mouth every 6 (six) hours as needed. 09/28/14   Orlie Dakin, MD  hydroxypropyl methylcellulose / hypromellose (ISOPTO TEARS / GONIOVISC) 2.5 % ophthalmic solution Place 1 drop into both eyes at bedtime.    Historical Provider, MD  ibuprofen (ADVIL,MOTRIN) 200 MG tablet Take 200 mg by mouth every 6 (six) hours as needed for moderate pain.    Historical Provider, MD  levothyroxine (SYNTHROID, LEVOTHROID) 125 MCG tablet Take 1 tablet (125 mcg total) by mouth daily before breakfast. 04/07/14   Biagio Borg, MD  loratadine (CLARITIN) 10 MG tablet  Take 10 mg by mouth daily as needed for allergies.    Historical Provider, MD  Multiple Vitamin (MULTIVITAMIN WITH MINERALS) TABS tablet Take 1 tablet by mouth daily.    Historical Provider, MD  phenazopyridine (PYRIDIUM) 100 MG tablet Take 1 tablet (100 mg total) by mouth 3 (three) times daily as needed for pain. 09/24/14   Biagio Borg, MD  phentermine 37.5 MG capsule Take 1 capsule (37.5 mg total) by mouth every morning. 04/07/14   Biagio Borg, MD  promethazine (PHENERGAN) 25 MG tablet Take 1 tablet (25 mg total) by mouth every 6 (six) hours as needed for nausea. 09/22/14   Nat Christen, MD  traMADol (ULTRAM) 50 MG tablet Take 1 tablet (50 mg total) by mouth every 6 (six) hours as needed. 09/22/14   Nat Christen, MD  triamcinolone (NASACORT AQ) 55 MCG/ACT AERO nasal inhaler Place 2 sprays into the nose daily. Patient taking differently: Place 2 sprays into the nose 2 (two) times daily as needed (allergies).  05/05/14   Biagio Borg, MD   BP 130/83 mmHg  Pulse 82  Temp(Src) 98.3 F (36.8 C) (Oral)  Resp 20  SpO2 100%  LMP 03/18/2014 Physical Exam  Constitutional: She is oriented to person, place, and time. She appears well-developed and well-nourished. She appears distressed.  HENT:  Head: Normocephalic and atraumatic.  Nose: Nose normal.  Mouth/Throat: Oropharynx is clear and moist. No oropharyngeal exudate.  Normal oral airway swelling seen. Patent airway.  Eyes: Conjunctivae and EOM are normal. Pupils are equal, round, and reactive to light. No scleral icterus.  Neck: Normal range of motion. Neck supple. No JVD present. No tracheal deviation present. No thyromegaly present.  Cardiovascular: Normal rate, regular rhythm and normal heart sounds.  Exam reveals no gallop and no friction rub.   No murmur heard. Pulmonary/Chest: Effort normal and breath sounds normal. No respiratory distress. She has no wheezes. She exhibits no tenderness.  Abdominal: Soft. Bowel sounds are normal. She exhibits no  distension and no mass. There is no tenderness. There is no rebound and no guarding.  Musculoskeletal: Normal range of motion. She exhibits no edema or tenderness.  Lymphadenopathy:    She has no cervical adenopathy.  Neurological: She is alert and oriented to person, place, and time. No cranial nerve deficit. She exhibits normal muscle tone.  Skin: Skin is warm and dry. No rash noted. She is not diaphoretic. No erythema. No pallor.  Nursing note and vitals reviewed.   ED Course  Procedures (including critical care time) Labs Review Labs Reviewed - No data to display  Imaging Review Dg Chest [facility] 1 View  11/10/2014   CLINICAL DATA:  Shortness of breath.  Possible drug reaction.  EXAM: PORTABLE CHEST - 1 VIEW  COMPARISON:  09/28/2014  FINDINGS: A single  AP portable view of the chest demonstrates no focal airspace consolidation or alveolar edema. The lungs are grossly clear. There is no large effusion or pneumothorax. Cardiac and mediastinal contours appear unremarkable.  IMPRESSION: No active disease.   Electronically Signed   By: Andreas Newport M.D.   On: 11/10/2014 02:55     EKG Interpretation None      MDM   Final diagnoses:  None   patient presents for foreign sensation in the throat and a feeling as if something is in her throat. She describes this swelling. We'll treat as allergic reaction however history is not consistent with this diagnosis. This also given a breathing treatment, we will obtain chest x-ray for evaluation of foreign body. She was given EpiPen and steroids.    Evaluation of the patient, symptomatically feels better. She states the feeling in her throat is subsiding. Patient was told this may be an allergic reaction and to avoid attending [facility] which she had in the future. We'll discharge with 4 more days of prednisone. Primary care follow-up and advised within 3 days. She overall appears well and comfortable in bed, she is in no acute distress, she is safe for  discharge.  Everlene Balls, MD 11/10/14 [phone]

## 2014-11-15 ENCOUNTER — Emergency Department (HOSPITAL_COMMUNITY): Payer: BC Managed Care – PPO

## 2014-11-15 ENCOUNTER — Encounter (HOSPITAL_COMMUNITY): Payer: Self-pay | Admitting: Family Medicine

## 2014-11-15 ENCOUNTER — Emergency Department (HOSPITAL_COMMUNITY)
Admission: EM | Admit: 2014-11-15 | Discharge: 2014-11-15 | Disposition: A | Discharge status: Home / Self Care | Payer: BC Managed Care – PPO | Attending: Emergency Medicine | Admitting: Emergency Medicine

## 2014-11-15 DIAGNOSIS — E039 Hypothyroidism, unspecified: Secondary | ICD-10-CM | POA: Insufficient documentation

## 2014-11-15 DIAGNOSIS — Z87891 Personal history of nicotine dependence: Secondary | ICD-10-CM | POA: Insufficient documentation

## 2014-11-15 DIAGNOSIS — R0789 Other chest pain: Secondary | ICD-10-CM | POA: Insufficient documentation

## 2014-11-15 DIAGNOSIS — Z8719 Personal history of other diseases of the digestive system: Secondary | ICD-10-CM | POA: Insufficient documentation

## 2014-11-15 DIAGNOSIS — I10 Essential (primary) hypertension: Secondary | ICD-10-CM | POA: Diagnosis not present

## 2014-11-15 DIAGNOSIS — E785 Hyperlipidemia, unspecified: Secondary | ICD-10-CM | POA: Diagnosis not present

## 2014-11-15 DIAGNOSIS — R079 Chest pain, unspecified: Secondary | ICD-10-CM | POA: Diagnosis present

## 2014-11-15 DIAGNOSIS — Z7952 Long term (current) use of systemic steroids: Secondary | ICD-10-CM | POA: Insufficient documentation

## 2014-11-15 DIAGNOSIS — J45909 Unspecified asthma, uncomplicated: Secondary | ICD-10-CM | POA: Diagnosis not present

## 2014-11-15 DIAGNOSIS — Z79899 Other long term (current) drug therapy: Secondary | ICD-10-CM | POA: Diagnosis not present

## 2014-11-15 DIAGNOSIS — Z862 Personal history of diseases of the blood and blood-forming organs and certain disorders involving the immune mechanism: Secondary | ICD-10-CM | POA: Insufficient documentation

## 2014-11-15 LAB — BASIC METABOLIC PANEL
Anion gap: 8 (ref 5–15)
BUN: 18 mg/dL (ref 6–20)
CO2: 25 mmol/L (ref 22–32)
Calcium: 9.2 mg/dL (ref 8.9–10.3)
Chloride: 106 mmol/L (ref 101–111)
Creatinine, Ser: 0.76 mg/dL (ref 0.44–1.00)
GFR calc Af Amer: >60 mL/min (ref >60)
GFR calc non Af Amer: >60 mL/min (ref >60)
Glucose, Bld: 119 mg/dL — ABNORMAL HIGH (ref 65–99)
Potassium: 3.8 mmol/L (ref 3.5–5.1)
Sodium: 139 mmol/L (ref 135–145)

## 2014-11-15 LAB — CBC
HCT: 37.5 % (ref 36.0–46.0)
Hemoglobin: 13 g/dL (ref 12.0–15.0)
MCH: 28 pg (ref 26.0–34.0)
MCHC: 34.7 g/dL (ref 30.0–36.0)
MCV: 80.6 fL (ref 78.0–100.0)
Platelets: 261 10*3/uL (ref 150–400)
RBC: 4.65 MIL/uL (ref 3.87–5.11)
RDW: 12.8 % (ref 11.5–15.5)
WBC: 13.8 10*3/uL — ABNORMAL HIGH (ref 4.0–10.5)

## 2014-11-15 LAB — D-DIMER, QUANTITATIVE: D-Dimer, Quant: 0.46 ug/mL-FEU (ref 0.00–0.48)

## 2014-11-15 LAB — I-STAT TROPONIN, ED
TROPONIN I, POC: 0 ng/mL (ref 0.00–0.08)
Troponin i, poc: 0.02 ng/mL (ref 0.00–0.08)

## 2014-11-15 MED ORDER — LORAZEPAM 1 MG PO TABS
1.0000 mg | ORAL_TABLET | Freq: Once | ORAL | Status: AC
  Administered 2014-11-15: 1 mg via ORAL
  Filled 2014-11-15: qty 1

## 2014-11-15 NOTE — ED Notes (Signed)
Pt ambulated to the restroom.

## 2014-11-15 NOTE — Discharge Instructions (Signed)
You were evaluated in the ED today for your chest pain. There is not appear to have emergent cause her symptoms at this time. Your exam, labs, EKG and chest x-ray were all reassuring today. It is important to follow up with primary care for further evaluation and management of your symptoms. Return to ED for new or worsening symptoms.  Chest Pain (Nonspecific) It is often hard to give a specific diagnosis for the cause of chest pain. There is always a chance that your pain could be related to something serious, such as a heart attack or a blood clot in the lungs. You need to follow up with your health care provider for further evaluation. CAUSES   Heartburn.  Pneumonia or bronchitis.  Anxiety or stress.  Inflammation around your heart (pericarditis) or lung (pleuritis or pleurisy).  A blood clot in the lung.  A collapsed lung (pneumothorax). It can develop suddenly on its own (spontaneous pneumothorax) or from trauma to the chest.  Shingles infection (herpes zoster virus). The chest wall is composed of bones, muscles, and cartilage. Any of these can be the source of the pain.  The bones can be bruised by injury.  The muscles or cartilage can be strained by coughing or overwork.  The cartilage can be affected by inflammation and become sore (costochondritis). DIAGNOSIS  Lab tests or other studies may be needed to find the cause of your pain. Your health care provider may have you take a test called an ambulatory electrocardiogram (ECG). An ECG records your heartbeat patterns over a 24-hour period. You may also have other tests, such as:  Transthoracic echocardiogram (TTE). During echocardiography, sound waves are used to evaluate how blood flows through your heart.  Transesophageal echocardiogram (TEE).  Cardiac monitoring. This allows your health care provider to monitor your heart rate and rhythm in real time.  Holter monitor. This is a portable device that records your heartbeat  and can help diagnose heart arrhythmias. It allows your health care provider to track your heart activity for several days, if needed.  Stress tests by exercise or by giving medicine that makes the heart beat faster. TREATMENT   Treatment depends on what may be causing your chest pain. Treatment may include:  Acid blockers for heartburn.  Anti-inflammatory medicine.  Pain medicine for inflammatory conditions.  Antibiotics if an infection is present.  You may be advised to change lifestyle habits. This includes stopping smoking and avoiding alcohol, caffeine, and chocolate.  You may be advised to keep your head raised (elevated) when sleeping. This reduces the chance of acid going backward from your stomach into your esophagus. Most of the time, nonspecific chest pain will improve within 2-3 days with rest and mild pain medicine.  HOME CARE INSTRUCTIONS   If antibiotics were prescribed, take them as directed. Finish them even if you start to feel better.  For the next few days, avoid physical activities that bring on chest pain. Continue physical activities as directed.  Do not use any tobacco products, including cigarettes, chewing tobacco, or electronic cigarettes.  Avoid drinking alcohol.  Only take medicine as directed by your health care provider.  Follow your health care provider's suggestions for further testing if your chest pain does not go away.  Keep any follow-up appointments you made. If you do not go to an appointment, you could develop lasting (chronic) problems with pain. If there is any problem keeping an appointment, call to reschedule. SEEK MEDICAL CARE IF:   Your chest pain  does not go away, even after treatment.  You have a rash with blisters on your chest.  You have a fever. SEEK IMMEDIATE MEDICAL CARE IF:   You have increased chest pain or pain that spreads to your arm, neck, jaw, back, or abdomen.  You have shortness of breath.  You have an  increasing cough, or you cough up blood.  You have severe back or abdominal pain.  You feel nauseous or vomit.  You have severe weakness.  You faint.  You have chills. This is an emergency. Do not wait to see if the pain will go away. Get medical help at once. Call your local emergency services (911 in [place] Do not drive yourself to the hospital. MAKE SURE YOU:   Understand these instructions.  Will watch your condition.  Will get help right away if you are not doing well or get worse. Document Released: 02/28/2005 Document Revised: 05/26/2013 Document Reviewed: 12/25/2007 [facility] Patient Information 2015 [place], [place]. This information is not intended to replace advice given to you by your health care provider. Make sure you discuss any questions you have with your health care provider.

## 2014-11-15 NOTE — ED Provider Notes (Signed)
CSN: [id number]     Arrival date & time 11/15/14  1422 History   First MD Initiated Contact with Patient 11/15/14 1524     Chief Complaint  Patient presents with  . Chest Pain     (Consider location/radiation/quality/duration/timing/severity/associated sxs/prior Treatment) HPI Marilyn [name] is a 49 y.o. female with history of hypertension, hyperlipidemia, hypothyroidism comes in for evaluation of chest discomfort. Patient states at approximately 7:30 AM this morning she began to experience palpitations as well as a chest pressure immediately following taking her prescribed in his own. She reports the symptoms resolved, but returned at approximately 11:45 AM. She reports associated shortness of breath with talking, nausea without vomiting. Denies radiation. She reports central chest pressure now in the ED and reports only expressing the palpitations when she moves around. No family history of cardiac problems. No other aggravating or modifying factors.  Past Medical History  Diagnosis Date  . Hyperlipidemia   . HYPOTHYROIDISM 01/30/2007  . 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/2007  . ASTHMA 01/30/2007  . GERD 01/31/2007  . Hiatal hernia   . [place] tear 04/2003  . Hemorrhoids   . HTN (hypertension) 09/09/2011   Past Surgical History  Procedure Laterality Date  . Abdominal hysterectomy    . Tubal ligation  1987  . Broken jaw    . Breast reduction surgery  1986  . Carpal tunnel release  2001  . Thyroidectomy     Family History  Problem Relation Age of Onset  . Hypertension Father   . Diabetes Father   . Breast cancer Sister   . Colon cancer Neg Hx   . Diabetes Maternal Grandmother   . Heart disease Maternal Grandmother   . Asthma Mother   . Asthma Maternal Grandfather    History  Substance Use Topics  . Smoking status: Former Smoker -- 0.25 packs/day for 3 years    Types: Cigarettes    Quit date: 06/04/1980  . Smokeless tobacco: Never Used  . Alcohol Use: No   OB  History    No data available     Review of Systems A 10 point review of systems was completed and was negative except for pertinent positives and negatives as mentioned in the history of present illness     Allergies  Codeine and Flonase  Home Medications   Prior to Admission medications   Medication Sig Start Date End Date Taking? Authorizing Provider  acetaminophen (TYLENOL) 325 MG tablet Take 325 mg by mouth every 6 (six) hours as needed for moderate pain.   Yes Historical Provider, MD  amLODipine (NORVASC) 10 MG tablet TAKE ONE TABLET BY MOUTH ONCE DAILY 07/20/14  Yes Biagio Borg, MD  atorvastatin (LIPITOR) 20 MG tablet Take 1 tablet (20 mg total) by mouth daily. 04/14/14 04/14/15 Yes Biagio Borg, MD  EPINEPHrine 0.3 mg/0.3 mL IJ SOAJ injection Inject 0.3 mLs (0.3 mg total) into the muscle once. 11/10/14  Yes Everlene Balls, MD  hydrochlorothiazide (MICROZIDE) 12.5 MG capsule Take 1 capsule (12.5 mg total) by mouth daily. As needed for swelling 04/07/14  Yes Biagio Borg, MD  hydroxypropyl methylcellulose / hypromellose (ISOPTO TEARS / GONIOVISC) 2.5 % ophthalmic solution Place 1 drop into both eyes at bedtime.   Yes Historical Provider, MD  levothyroxine (SYNTHROID, LEVOTHROID) 125 MCG tablet Take 1 tablet (125 mcg total) by mouth daily before breakfast. 04/07/14  Yes Biagio Borg, MD  loratadine (CLARITIN) 10 MG tablet Take 10 mg by mouth daily as needed for  allergies.   Yes Historical Provider, MD  Multiple Vitamin (MULTIVITAMIN WITH MINERALS) TABS tablet Take 1 tablet by mouth daily.   Yes Historical Provider, MD  predniSONE (DELTASONE) 20 MG tablet Take 3 tablets (60 mg total) by mouth daily. 11/10/14  Yes Everlene Balls, MD   BP 139/86 mmHg  Pulse 79  Temp(Src) 98 F (36.7 C) (Oral)  Resp 26  SpO2 98%  LMP 03/18/2014 Physical Exam  Constitutional: She is oriented to person, place, and time. She appears well-developed and well-nourished.  HENT:  Head: Normocephalic and atraumatic.   Mouth/Throat: Oropharynx is clear and moist.  Eyes: Conjunctivae are normal. Pupils are equal, round, and reactive to light. Right eye exhibits no discharge. Left eye exhibits no discharge. No scleral icterus.  Neck: Neck supple.  Cardiovascular: Normal rate, regular rhythm, normal heart sounds and intact distal pulses.  Exam reveals no gallop and no friction rub.   No murmur heard. Pulmonary/Chest: Effort normal and breath sounds normal. No respiratory distress. She has no wheezes. She has no rales.  Tenderness to palpation central chest and surrounding costochondral spaces. No obvious lesions or deformities.  Abdominal: Soft. There is no tenderness.  Musculoskeletal: She exhibits no tenderness.  Neurological: She is alert and oriented to person, place, and time.  Cranial Nerves II-XII grossly intact  Skin: Skin is warm and dry. No rash noted.  Psychiatric: She has a normal mood and affect.  Nursing note and vitals reviewed.   ED Course  Procedures (including critical care time) Labs Review Labs Reviewed  CBC - Abnormal; Notable for the following:    WBC 13.8 (*)    All other components within normal limits  BASIC METABOLIC PANEL - Abnormal; Notable for the following:    Glucose, Bld 119 (*)    All other components within normal limits  D-DIMER, QUANTITATIVE (NOT AT [facility])  Randolm Idol, ED  Randolm Idol, ED    Imaging Review Dg Chest 2 View  11/15/2014   CLINICAL DATA:  Chest pain, shortness of breath, lightheadedness  EXAM: CHEST  2 VIEW  COMPARISON:  11/10/2014  FINDINGS: Lungs are clear.  No pleural effusion or pneumothorax.  The heart is normal in size.  Degenerative changes of the visualized thoracolumbar spine.  IMPRESSION: Normal chest radiographs.   Electronically Signed   By: Julian Hy M.D.   On: 11/15/2014 14:58     EKG Interpretation   Date/Time:  Monday November 15 2014 14:29:26 EDT Ventricular Rate:  113 PR Interval:  172 QRS Duration: 68 QT  Interval:  326 QTC Calculation: 447 R Axis:   70 Text Interpretation:  Sinus tachycardia Otherwise normal ECG PR  depression? Confirmed by Wilson Singer  MD, [place] [phone]) on 11/15/2014 4:52:19 PM     Meds given in ED:  Medications  LORazepam (ATIVAN) tablet 1 mg (1 mg Oral Given 11/15/14 1551)    New Prescriptions   No medications on file   Filed Vitals:   11/15/14 1630 11/15/14 1700 11/15/14 1730 11/15/14 1745  BP: 128/72 128/78 125/80 139/86  Pulse: 71 83 79 79  Temp:      TempSrc:      Resp: 23 13 27 26   SpO2: 100% 97% 97% 98%    MDM  Vitals stable - WNL -afebrile Pt resting comfortably in ED. chest discomfort has resolved in the ED with administration of oral Ativan. PE--Lung exam normal. Cardiac auscultation reveals no murmurs rubs or gallops. Grossly Benign Physical Exam Labwork: Initial/DeltaTroponin negative. EKG reassuring, repeat EKG  shows sinus rhythm at 87. Nonspecific leukocytosis 13.8, no evidence of infection. D-Dimer negative, Labs otherwise noncontributory Imaging: CXR shows no acute cardio pulmonary pathology.  DDX: Patient with chest discomfort likely due to MSK versus anxiety. Clinical picture and exam today not consistent with ACS/dissection. Heart score 3. No evidence of spontaneous pneumothorax, esophageal rupture or other mediastinitis. Doubt PE. No evidence of myocarditis, endocarditis, pericarditis.  I discussed all relevant lab findings and imaging results with pt and they verbalized understanding. Discussed f/u with PCP within 48 hrs and return precautions, pt very amenable to plan. Prior to patient discharge, I discussed and reviewed this case with Dr. Wilson Singer     Final diagnoses:  Chest discomfort       Comer Locket, PA-C 11/15/14 1805  Virgel Manifold, MD 11/17/14 [phone]

## 2014-11-15 NOTE — ED Notes (Signed)
Pt sts chest pain that started about 1 hour ago. sts nothing strenuous. sts center of chest. sts some nausea and SOB.

## 2014-11-30 ENCOUNTER — Ambulatory Visit (INDEPENDENT_AMBULATORY_CARE_PROVIDER_SITE_OTHER): Payer: BC Managed Care – PPO | Admitting: Internal Medicine

## 2014-11-30 ENCOUNTER — Encounter: Payer: Self-pay | Admitting: Internal Medicine

## 2014-11-30 VITALS — BP 132/88 | HR 95 | Temp 98.2°F | Ht 64.0 in | Wt 197.0 lb

## 2014-11-30 DIAGNOSIS — I10 Essential (primary) hypertension: Secondary | ICD-10-CM | POA: Diagnosis not present

## 2014-11-30 DIAGNOSIS — K219 Gastro-esophageal reflux disease without esophagitis: Secondary | ICD-10-CM | POA: Diagnosis not present

## 2014-11-30 DIAGNOSIS — M545 Low back pain, unspecified: Secondary | ICD-10-CM

## 2014-11-30 MED ORDER — PANTOPRAZOLE SODIUM 40 MG PO TBEC: 40.0000 mg | DELAYED_RELEASE_TABLET | Freq: Two times a day (BID) | ORAL | Status: DC

## 2014-11-30 NOTE — Progress Notes (Signed)
Subjective:    Patient ID: Marilyn [name], female    DOB: 02-Dec-1965, 49 y.o.   MRN: [id number]  HPI  Here after seen at ED twice, first for allergy rxn after eating tuna (hx of similar with pizza one time), now resolved, will cont to avoid tune. Pt denies chest pain, increased sob or doe, wheezing, orthopnea, PND, increased LE swelling, palpitations, dizziness or syncope. Pt denies new neurological symptoms such as new headache, or facial or extremity weakness or numbness   Pt denies polydipsia, polyuria,   Pt co recent onset stabbing pinhing now hard pains to LBP without bowel or bladder change, fever, wt loss,  worsening LE pain/numbness/weakness, gait change or falls. Also GERD seems not well controlled - pepto bismol, vinegar and water, and herbal supplement for indigestion , as well as prilosec OTC bid, nothing seems to be working  Seems typical for her reflux to her, also asks for h pylori to r/o gastritis.  No longer having upper back pains as she was having recently.  Has not been able to see Dr Fuller Plan recently as referred due to cost.Denies other abd pain, dysphagia, n/v, bowel change or blood.  Last EGD 2012 with hiatal hernia, biopsy neg. Past Medical History  Diagnosis Date  . Hyperlipidemia   . HYPOTHYROIDISM 01/30/2007  . 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/2007  . ASTHMA 01/30/2007  . GERD 01/31/2007  . Hiatal hernia   . [place] tear 04/2003  . Hemorrhoids   . HTN (hypertension) 09/09/2011   Past Surgical History  Procedure Laterality Date  . Abdominal hysterectomy    . Tubal ligation  1987  . Broken jaw    . Breast reduction surgery  1986  . Carpal tunnel release  2001  . Thyroidectomy      reports that she quit smoking about 34 years ago. Her smoking use included Cigarettes. She has a .75 pack-year smoking history. She has never used smokeless tobacco. She reports that she does not drink alcohol or use illicit drugs. family history includes Asthma in her maternal  grandfather and mother; Breast cancer in her sister; Diabetes in her father and maternal grandmother; Heart disease in her maternal grandmother; Hypertension in her father. There is no history of Colon cancer. Allergies  Allergen Reactions  . Codeine Other (See Comments)    Makes really sick  . Flonase [Fluticasone Propionate] Other (See Comments)    Nose bleeds   Current Outpatient Prescriptions on File Prior to Visit  Medication Sig Dispense Refill  . acetaminophen (TYLENOL) 325 MG tablet Take 325 mg by mouth every 6 (six) hours as needed for moderate pain.    [name] amLODipine (NORVASC) 10 MG tablet TAKE ONE TABLET BY MOUTH ONCE DAILY 90 tablet 3  . atorvastatin (LIPITOR) 20 MG tablet Take 1 tablet (20 mg total) by mouth daily. 90 tablet 3  . EPINEPHrine 0.3 mg/0.3 mL IJ SOAJ injection Inject 0.3 mLs (0.3 mg total) into the muscle once. 1 Device 0  . hydrochlorothiazide (MICROZIDE) 12.5 MG capsule Take 1 capsule (12.5 mg total) by mouth daily. As needed for swelling 90 capsule 3  . hydroxypropyl methylcellulose / hypromellose (ISOPTO TEARS / GONIOVISC) 2.5 % ophthalmic solution Place 1 drop into both eyes at bedtime.    [name] levothyroxine (SYNTHROID, LEVOTHROID) 125 MCG tablet Take 1 tablet (125 mcg total) by mouth daily before breakfast. 90 tablet 3  . loratadine (CLARITIN) 10 MG tablet Take 10 mg by mouth daily as needed for allergies.    [name]  Multiple Vitamin (MULTIVITAMIN WITH MINERALS) TABS tablet Take 1 tablet by mouth daily.    . predniSONE (DELTASONE) 20 MG tablet Take 3 tablets (60 mg total) by mouth daily. (Patient not taking: Reported on 11/30/2014) 12 tablet 0   No current facility-administered medications on file prior to visit.    Review of Systems  Constitutional: Negative for unusual diaphoresis or night sweats HENT: Negative for ringing in ear or discharge Eyes: Negative for double vision or worsening visual disturbance.  Respiratory: Negative for choking and stridor.     Gastrointestinal: Negative for vomiting or other signifcant bowel change Genitourinary: Negative for hematuria or change in urine volume.  Musculoskeletal: Negative for other MSK pain or swelling Skin: Negative for color change and worsening wound.  Neurological: Negative for tremors and numbness other than noted  Psychiatric/Behavioral: Negative for decreased concentration or agitation other than above       Objective:   Physical Exam BP 132/88 mmHg  Pulse 95  Temp(Src) 98.2 F (36.8 C) (Oral)  Ht 5\' 4"  (1.626 m)  Wt 197 lb (89.359 kg)  BMI 33.80 kg/m2  SpO2 98%  LMP 03/18/2014 VS noted,  Constitutional: Pt appears in no significant distress HENT: Head: NCAT.  Right Ear: External ear normal.  Left Ear: External ear normal.  Eyes: . Pupils are equal, round, and reactive to light. Conjunctivae and EOM are normal Neck: Normal range of motion. Neck supple.  Cardiovascular: Normal rate and regular rhythm.   Pulmonary/Chest: Effort normal and breath sounds without rales or wheezing.  Abd:  Soft, NT, ND, + BS Neurological: Pt is alert. Not confused , motor grossly intact Skin: Skin is warm. No rash, no LE edema Psychiatric: Pt behavior is normal. No agitation.  Spine nontender    Assessment & Plan:

## 2014-11-30 NOTE — Progress Notes (Signed)
Pre visit review using our clinic review tool, if applicable. No additional management support is needed unless otherwise documented below in the visit note. 

## 2014-11-30 NOTE — Assessment & Plan Note (Signed)
stable overall by history and exam, recent data reviewed with pt, and pt to continue medical treatment as before,  to f/u any worsening symptoms or concerns BP Readings from Last 3 Encounters:  11/30/14 132/88  11/15/14 132/89  11/10/14 111/69

## 2014-11-30 NOTE — Patient Instructions (Signed)
Ok to stop the prilosec  Please take all new medication as prescribed - the protonix  Please continue all other medications as before, and refills have been done if requested.  Please have the pharmacy call with any other refills you may need.  Please continue your efforts at being more active, low cholesterol diet, and weight control.  You will be contacted regarding the referral for: Gastroenterology (Dr Fuller Plan)  Please keep your appointments with your specialists as you may have planned

## 2014-11-30 NOTE — Assessment & Plan Note (Signed)
Uncontrolelled, for protonix 40 bid, cont anti-reflux precaustion, she is ok with referall back to Dr Fuller Plan if allowed to make payments

## 2014-11-30 NOTE — Assessment & Plan Note (Signed)
Mild, bilat paravertebral but not flank related, intermittent, sharp, upper lumbar/lower thoracic ; suspect underlying djd/ddd - for tylenol prn, avoid nsaids due to GI symtpoms

## 2014-12-03 HISTORY — PX: BREAST BIOPSY: SHX20

## 2014-12-17 ENCOUNTER — Other Ambulatory Visit: Payer: Self-pay | Admitting: Family Medicine

## 2014-12-17 DIAGNOSIS — N6310 Unspecified lump in the right breast, unspecified quadrant: Secondary | ICD-10-CM

## 2014-12-20 ENCOUNTER — Encounter: Payer: Self-pay | Admitting: Gastroenterology

## 2014-12-22 ENCOUNTER — Other Ambulatory Visit: Payer: Self-pay | Admitting: Obstetrics and Gynecology

## 2014-12-22 ENCOUNTER — Ambulatory Visit
Admission: RE | Admit: 2014-12-22 | Discharge: 2014-12-22 | Disposition: A | Discharge status: Home / Self Care | Payer: BC Managed Care – PPO | Source: Ambulatory Visit | Attending: Family Medicine | Admitting: Family Medicine

## 2014-12-22 DIAGNOSIS — R928 Other abnormal and inconclusive findings on diagnostic imaging of breast: Secondary | ICD-10-CM

## 2014-12-22 DIAGNOSIS — N6310 Unspecified lump in the right breast, unspecified quadrant: Secondary | ICD-10-CM

## 2014-12-27 ENCOUNTER — Other Ambulatory Visit: Payer: Self-pay | Admitting: Obstetrics and Gynecology

## 2014-12-27 DIAGNOSIS — R928 Other abnormal and inconclusive findings on diagnostic imaging of breast: Secondary | ICD-10-CM

## 2014-12-28 ENCOUNTER — Ambulatory Visit
Admission: RE | Admit: 2014-12-28 | Discharge: 2014-12-28 | Disposition: A | Discharge status: Home / Self Care | Payer: BC Managed Care – PPO | Source: Ambulatory Visit | Attending: Obstetrics and Gynecology | Admitting: Obstetrics and Gynecology

## 2014-12-28 DIAGNOSIS — R928 Other abnormal and inconclusive findings on diagnostic imaging of breast: Secondary | ICD-10-CM

## 2015-01-18 ENCOUNTER — Telehealth: Payer: Self-pay | Admitting: *Deleted

## 2015-01-18 NOTE — Telephone Encounter (Signed)
Left message for patient. Varney Biles our breast navigator contacted Crystal, office manager at [facility]. crystal has assigned a new surgeon to her. Dr Lucia Gaskins will see her sept 15th at 8:45. Crystal has also left messages for patient.

## 2015-02-01 ENCOUNTER — Other Ambulatory Visit (HOSPITAL_BASED_OUTPATIENT_CLINIC_OR_DEPARTMENT_OTHER): Payer: BC Managed Care – PPO

## 2015-02-01 ENCOUNTER — Ambulatory Visit (HOSPITAL_BASED_OUTPATIENT_CLINIC_OR_DEPARTMENT_OTHER): Payer: BC Managed Care – PPO | Admitting: Oncology

## 2015-02-01 ENCOUNTER — Telehealth: Payer: Self-pay | Admitting: Oncology

## 2015-02-01 VITALS — BP 119/64 | HR 94 | Temp 98.6°F | Resp 18 | Ht 64.0 in | Wt 199.6 lb

## 2015-02-01 DIAGNOSIS — R109 Unspecified abdominal pain: Secondary | ICD-10-CM | POA: Diagnosis not present

## 2015-02-01 DIAGNOSIS — D508 Other iron deficiency anemias: Secondary | ICD-10-CM

## 2015-02-01 DIAGNOSIS — D500 Iron deficiency anemia secondary to blood loss (chronic): Secondary | ICD-10-CM

## 2015-02-01 DIAGNOSIS — D241 Benign neoplasm of right breast: Secondary | ICD-10-CM | POA: Diagnosis not present

## 2015-02-01 DIAGNOSIS — N920 Excessive and frequent menstruation with regular cycle: Secondary | ICD-10-CM | POA: Diagnosis not present

## 2015-02-01 LAB — CBC WITH DIFFERENTIAL/PLATELET
BASO%: 0.7 % (ref 0.0–2.0)
Basophils Absolute: 0 10*3/uL (ref 0.0–0.1)
EOS%: 1.7 % (ref 0.0–7.0)
Eosinophils Absolute: 0.1 10*3/uL (ref 0.0–0.5)
HEMATOCRIT: 37.2 % (ref 34.8–46.6)
HGB: 12.3 g/dL (ref 11.6–15.9)
LYMPH#: 2.4 10*3/uL (ref 0.9–3.3)
LYMPH%: 44.9 % (ref 14.0–49.7)
MCH: 27.5 pg (ref 25.1–34.0)
MCHC: 33.1 g/dL (ref 31.5–36.0)
MCV: 83.1 fL (ref 79.5–101.0)
MONO#: 0.4 10*3/uL (ref 0.1–0.9)
MONO%: 8.1 % (ref 0.0–14.0)
NEUT%: 44.6 % (ref 38.4–76.8)
NEUTROS ABS: 2.4 10*3/uL (ref 1.5–6.5)
Platelets: 231 10*3/uL (ref 145–400)
RBC: 4.48 10*6/uL (ref 3.70–5.45)
RDW: 13.5 % (ref 11.2–14.5)
WBC: 5.3 10*3/uL (ref 3.9–10.3)

## 2015-02-01 LAB — IRON AND TIBC CHCC
%SAT: 19 % — ABNORMAL LOW (ref 21–57)
Iron: 56 ug/dL (ref 41–142)
TIBC: 290 ug/dL (ref 236–444)
UIBC: 234 ug/dL (ref 120–384)

## 2015-02-01 LAB — FERRITIN CHCC: Ferritin: 975 ng/ml — ABNORMAL HIGH (ref 9–269)

## 2015-02-01 NOTE — Progress Notes (Signed)
[place] NOTE   Marilyn Cower, MD [place] [address]  DIAGNOSIS: 49 year old woman with iron deficiency anemia dating back to 2012.  Prior therapy: The patient has received Feraheme on multiple occasions the most recent of which was in October 2015.   CURRENT THERAPY: Observation and surveillance. IV iron as needed.     INTERVAL HISTORY:  [name] presents today for a follow-up visit. Since the last visit, he continues to feel relatively well. She does report occasional constipation and some decline in her appetite. Her weight has been relatively stable and actually gained 2 pounds. She did have a abnormal mammogram and a breast biopsy showed a possible fibroadenoma and less likely phyllodes tumoe.  She has not reported any hematochezia or melena.    She does not report any fevers chills or sweats. Her appetite is excellent has not lost weight. She does not report any headaches him a syncope or seizures. She does not report any chest pain or palpitation. She does not report any nausea, vomiting, abdominal pain. She does not report any frequency urgency or hesitancy. She does not report any skeletal complaints. Remaining review of systems unremarkable.  MEDICAL HISTORY: Past Medical History  Diagnosis Date  . Hyperlipidemia   . HYPOTHYROIDISM 01/30/2007  . 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/2007  . ASTHMA 01/30/2007  . GERD 01/31/2007  . Hiatal hernia   . Coppock tear 04/2003  . Hemorrhoids   . HTN (hypertension) 09/09/2011    I ALLERGIES:  is allergic to codeine and flonase.  MEDICATIONS:   Current Outpatient Prescriptions  Medication Sig Dispense Refill  . acetaminophen (TYLENOL) 325 MG tablet Take 325 mg by mouth every 6 (six) hours as needed for moderate pain.    [name] amLODipine (NORVASC) 10 MG tablet TAKE ONE TABLET BY MOUTH ONCE DAILY 90 tablet 3  . atorvastatin (LIPITOR) 20 MG tablet Take 1 tablet (20 mg  total) by mouth daily. 90 tablet 3  . EPINEPHrine 0.3 mg/0.3 mL IJ SOAJ injection Inject 0.3 mLs (0.3 mg total) into the muscle once. 1 Device 0  . hydrochlorothiazide (MICROZIDE) 12.5 MG capsule Take 1 capsule (12.5 mg total) by mouth daily. As needed for swelling 90 capsule 3  . hydroxypropyl methylcellulose / hypromellose (ISOPTO TEARS / GONIOVISC) 2.5 % ophthalmic solution Place 1 drop into both eyes at bedtime.    [name] levothyroxine (SYNTHROID, LEVOTHROID) 125 MCG tablet Take 1 tablet (125 mcg total) by mouth daily before breakfast. 90 tablet 3  . loratadine (CLARITIN) 10 MG tablet Take 10 mg by mouth daily as needed for allergies.    . Multiple Vitamin (MULTIVITAMIN WITH MINERALS) TABS tablet Take 1 tablet by mouth daily.    . pantoprazole (PROTONIX) 40 MG tablet Take 1 tablet (40 mg total) by mouth 2 (two) times daily before a meal. 180 tablet 3  . predniSONE (DELTASONE) 20 MG tablet Take 3 tablets (60 mg total) by mouth daily. (Patient not taking: Reported on 11/30/2014) 12 tablet 0   No current facility-administered medications for this visit.     PHYSICAL EXAMINATION: ECOG PERFORMANCE STATUS: 0  Blood pressure 119/64, pulse 94, temperature 98.6 F (37 C), temperature source Oral, resp. rate 18, height 5\' 4"  (1.626 m), weight 199 lb 9.6 oz (90.538 kg), last menstrual period 03/18/2014, SpO2 99 %.  GENERAL:alert, no distress and comfortable.  SKIN: skin color, texture, turgor are normal, no rashes or significant lesions EYES: normal, Conjunctiva are  pink and non-injected, sclera clear OROPHARYNX:no exudate, no erythema and lips. No oral thrush. NECK: supple, thyroid normal size, non-tender, without nodularity LYMPH:  no palpable lymphadenopathy in the cervical, axillary or supraclavicular LUNGS: clear to auscultation and percussion with normal breathing effort HEART: regular rate & rhythm and no murmurs and no lower extremity edema ABDOMEN:abdomen soft, non-tender and normal bowel  sounds Musculoskeletal:no cyanosis of digits and no clubbing  NEURO: alert & oriented x 3 with fluent speech, no focal motor/sensory deficits  CBC    Component Value Date/Time   WBC 5.3 02/01/2015 1421   WBC 13.8* 11/15/2014 1438   WBC 4.9 07/12/2013 1113   RBC 4.48 02/01/2015 1421   RBC 4.65 11/15/2014 1438   RBC 5.03 07/12/2013 1113   HGB 12.3 02/01/2015 1421   HGB 13.0 11/15/2014 1438   HGB 13.4 07/12/2013 1113   HCT 37.2 02/01/2015 1421   HCT 37.5 11/15/2014 1438   HCT 43.3 07/12/2013 1113   PLT 231 02/01/2015 1421   PLT 261 11/15/2014 1438   MCV 83.1 02/01/2015 1421   MCV 80.6 11/15/2014 1438   MCV 86.0 07/12/2013 1113   MCH 27.5 02/01/2015 1421   MCH 28.0 11/15/2014 1438   MCH 26.6* 07/12/2013 1113   MCHC 33.1 02/01/2015 1421   MCHC 34.7 11/15/2014 1438   MCHC 30.9* 07/12/2013 1113   RDW 13.5 02/01/2015 1421   RDW 12.8 11/15/2014 1438   LYMPHSABS 2.4 02/01/2015 1421   LYMPHSABS 2.9 09/21/2014 2051   MONOABS 0.4 02/01/2015 1421   MONOABS 0.4 09/21/2014 2051   EOSABS 0.1 02/01/2015 1421   EOSABS 0.1 09/21/2014 2051   BASOSABS 0.0 02/01/2015 1421   BASOSABS 0.0 09/21/2014 2051      Chemistry      Component Value Date/Time   NA 139 11/15/2014 1438   NA 141 04/01/2014 1415   K 3.8 11/15/2014 1438   K 3.6 04/01/2014 1415   CL 106 11/15/2014 1438   CL 104 08/12/2012 1329   CO2 25 11/15/2014 1438   CO2 27 04/01/2014 1415   BUN 18 11/15/2014 1438   BUN 10.8 04/01/2014 1415   CREATININE 0.76 11/15/2014 1438   CREATININE 0.9 04/01/2014 1415      Component Value Date/Time   CALCIUM 9.2 11/15/2014 1438   CALCIUM 8.2* 04/01/2014 1415   ALKPHOS 88 09/21/2014 2051   ALKPHOS 82 10/01/2013 1338   AST 27 09/21/2014 2051   AST 18 10/01/2013 1338   ALT 31 09/21/2014 2051   ALT 14 10/01/2013 1338   BILITOT 0.3 09/21/2014 2051   BILITOT 0.20 10/01/2013 1338       Assessment and plan:    49 year old woman with the following issues.   1. Iron deficiency  anemia secondary to menstruation: Her hemoglobin is adequate today but iron studies are currently pending. We will arrange for IV iron in the future if her iron studies are lobar declining.  2. Right-sided flank pain: Unclear etiology imaging studies did not show any evidence to suggest malignancy.   3. 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 She has a surgical consultation and 2 weeks. We will await the input from surgery regarding further surgery at this point.  4. Follow-up: Will be in 4 months sooner if needed to.  [facility] MD 02/01/2015

## 2015-02-01 NOTE — Telephone Encounter (Signed)
per pof to sch pt appt-gave pt copy of avs °

## 2015-02-17 ENCOUNTER — Other Ambulatory Visit: Payer: Self-pay | Admitting: Surgery

## 2015-02-17 DIAGNOSIS — N6310 Unspecified lump in the right breast, unspecified quadrant: Secondary | ICD-10-CM

## 2015-02-22 ENCOUNTER — Encounter (HOSPITAL_BASED_OUTPATIENT_CLINIC_OR_DEPARTMENT_OTHER): Payer: Self-pay | Admitting: *Deleted

## 2015-02-22 ENCOUNTER — Encounter: Payer: Self-pay | Admitting: Gastroenterology

## 2015-02-22 ENCOUNTER — Ambulatory Visit (INDEPENDENT_AMBULATORY_CARE_PROVIDER_SITE_OTHER): Payer: BC Managed Care – PPO | Admitting: Gastroenterology

## 2015-02-22 VITALS — BP 120/80 | HR 104 | Ht 62.75 in | Wt 198.0 lb

## 2015-02-22 DIAGNOSIS — R1314 Dysphagia, pharyngoesophageal phase: Secondary | ICD-10-CM | POA: Diagnosis not present

## 2015-02-22 DIAGNOSIS — K219 Gastro-esophageal reflux disease without esophagitis: Secondary | ICD-10-CM

## 2015-02-22 MED ORDER — LANSOPRAZOLE 30 MG PO CPDR: 30.0000 mg | DELAYED_RELEASE_CAPSULE | Freq: Two times a day (BID) | ORAL | Status: DC

## 2015-02-22 NOTE — Progress Notes (Signed)
    History of Present Illness: This is a 49 year old female referred by Biagio Borg, MD for the evaluation of GERD and dysphagia. She underwent colonoscopy and upper endoscopy in July 2012 for evaluation of iron deficiency anemia and GERD. Colonoscopy was normal. EGD showed only a small hiatal hernia. Iron deficiency was felt to be on the basis of menstrual losses and she has been followed by hematology and treated with iron intermittently. She relates worsening reflux symptoms over the past several months and several episodes of solid food dysphagia. She also notes occasional irritation in her throat. Also complains of occasional brief stabbing upper back pains and occasional palpitations. She relates a 20 pound weight gain over the past several months. A right breast biopsy is scheduled for Friday for presumed benign right breast lesion.  Review of Systems: Pertinent positive and negative review of systems were noted in the above HPI section. All other review of systems were otherwise negative.  Current Medications, Allergies, Past Medical History, Past Surgical History, Family History and Social History were reviewed in [employer] record.  Physical Exam: General: Well developed, well nourished, no acute distress Head: Normocephalic and atraumatic Eyes:  sclerae anicteric, EOMI Ears: Normal auditory acuity Mouth: No deformity or lesions Neck: Supple, no masses or thyromegaly Lungs: Clear throughout to auscultation Heart: Regular rate and rhythm; no murmurs, rubs or bruits Abdomen: Soft, non tender and non distended. No masses, hepatosplenomegaly or hernias noted. Normal Bowel sounds Musculoskeletal: Symmetrical with no gross deformities  Skin: No lesions on visible extremities Pulses:  Normal pulses noted Extremities: No clubbing, cyanosis, edema or deformities noted Neurological: Alert oriented x 4, grossly nonfocal Cervical Nodes:  No significant cervical  adenopathy Inguinal Nodes: No significant inguinal adenopathy Psychological:  Alert and cooperative. Anxious  Assessment and Recommendations:  1. GERD and dysphasia. Possible LPR, esophagitis, esophageal stricture. Intermittent palpitations and intermittent upper back pain are not liekly to be related to GERD. Closely follow all antireflux measures including 4 inch head of bed elevation. Encouraged weight loss program. Change to lansoprazole 30 mg twice daily. Schedule EGD with possible dilation. The risks (including bleeding, perforation, infection, missed lesions, medication reactions and possible hospitalization or surgery if complications occur), benefits, and alternatives to endoscopy with possible biopsy and possible dilation were discussed with the patient and they consent to proceed.    cc: Biagio Borg, MD [place], [place] [address]

## 2015-02-22 NOTE — Patient Instructions (Signed)
We have sent the following medications to your pharmacy for you to pick up at your convenience: Prevacid 30mg  twice a day  You have been scheduled for an endoscopy. Please follow written instructions given to you at your visit today. If you use inhalers (even only as needed), please bring them with you on the day of your procedure. Your physician has requested that you go to www.startemmi.com and enter the access code given to you at your visit today. This web site gives a general overview about your procedure. However, you should still follow specific instructions given to you by our office regarding your preparation for the procedure.  Thank you for choosing me and [facility].  Pricilla Riffle. Dagoberto Ligas., MD., Marval Regal

## 2015-02-23 ENCOUNTER — Ambulatory Visit
Admission: RE | Admit: 2015-02-23 | Discharge: 2015-02-23 | Disposition: A | Discharge status: Home / Self Care | Payer: BC Managed Care – PPO | Source: Ambulatory Visit | Attending: Surgery | Admitting: Surgery

## 2015-02-23 ENCOUNTER — Encounter (HOSPITAL_BASED_OUTPATIENT_CLINIC_OR_DEPARTMENT_OTHER)
Admission: RE | Admit: 2015-02-23 | Discharge: 2015-02-23 | Disposition: A | Discharge status: Home / Self Care | Payer: BC Managed Care – PPO | Source: Ambulatory Visit | Attending: Surgery | Admitting: Surgery

## 2015-02-23 DIAGNOSIS — E785 Hyperlipidemia, unspecified: Secondary | ICD-10-CM | POA: Diagnosis not present

## 2015-02-23 DIAGNOSIS — D241 Benign neoplasm of right breast: Secondary | ICD-10-CM | POA: Diagnosis not present

## 2015-02-23 DIAGNOSIS — N641 Fat necrosis of breast: Secondary | ICD-10-CM | POA: Diagnosis not present

## 2015-02-23 DIAGNOSIS — I10 Essential (primary) hypertension: Secondary | ICD-10-CM | POA: Diagnosis not present

## 2015-02-23 DIAGNOSIS — N6310 Unspecified lump in the right breast, unspecified quadrant: Secondary | ICD-10-CM

## 2015-02-23 DIAGNOSIS — Z87891 Personal history of nicotine dependence: Secondary | ICD-10-CM | POA: Diagnosis not present

## 2015-02-23 LAB — BASIC METABOLIC PANEL
Anion gap: 8 (ref 5–15)
BUN: 15 mg/dL (ref 6–20)
CALCIUM: 8.8 mg/dL — AB (ref 8.9–10.3)
CO2: 28 mmol/L (ref 22–32)
CREATININE: 0.84 mg/dL (ref 0.44–1.00)
Chloride: 100 mmol/L — ABNORMAL LOW (ref 101–111)
GFR calc Af Amer: >60 mL/min (ref >60)
GLUCOSE: 104 mg/dL — AB (ref 65–99)
Potassium: 4.1 mmol/L (ref 3.5–5.1)
Sodium: 136 mmol/L (ref 135–145)

## 2015-02-24 NOTE — H&P (Signed)
Marilyn [name]  Location: [facility] Patient #: [id number] DOB: 1965-06-19 Single / Language: Cleophus Molt / Race: Black or African American Female  History of Present Illness  The patient is a 49 year old female who presents with a breast mass.  Her PCP is Dr. Marshall Cork. She comes by herself.  She has been getting annual mammograms. Dr. Harrington Challenger could feel a mass in her right breast. The prompted mammograms, which noticed a mass that has enlarged a little. She had a breast US on 12/22/2014 which showed a 5 x 8 x 6 mm lesion at 7:30 o'clock in the right breast. She had a right breast biopsy on 12/28/2014 - [id number] - biphasic lesion. She comes for further discussion. She had breast reduction surgery in 1986 by Dr. Lemmie Evens. [place]. She has no other breast problems. Her last period was about one year ago. She is not on hormones.  She sees Dr.Kendra Harrington Challenger for [employer].  Past Medical History: 1. Fe deficiency anemia sees Dr. Dahlia Byes 2. GERD Has seen Dr. Fuller Plan 3. Hyperlipidemia 4. HTN 5. Thyroidectomy at age 52 for benign disease 6. Asthma Needs steroids about every 6 months  Social History: Single Works as Educational psychologist at [employer], [place] Has 3 children: 41, 34, and 29. 46 yo son lives with her      Other Problems Elbert Ewings, [place]; 02/17/2015 8:36 AM) Asthma Back Pain Chest pain Gastroesophageal Reflux Disease High blood pressure Hypercholesterolemia Thyroid Disease  Past Surgical History Elbert Ewings, CMA; 02/17/2015 8:36 AM) Breast Biopsy Right. Colon Polyp Removal - Open Mammoplasty; Reduction Bilateral. Thyroid Surgery  Diagnostic Studies History Elbert Ewings, [place]; 02/17/2015 8:36 AM) Colonoscopy 1-5 years ago Mammogram within last year  Allergies Elbert Ewings, CMA; 02/17/2015 8:37 AM) Codeine Sulfate *ANALGESICS - OPIOID* Flonase *NASAL AGENTS - SYSTEMIC AND TOPICAL*  Medication History Elbert Ewings,  CMA; 02/17/2015 8:38 AM) AmLODIPine Besylate (10MG  Tablet, Oral) Active. Atorvastatin Calcium (20MG  Tablet, Oral) Active. EpiPen 2-Pak (0.3MG /0.3ML Soln Auto-inj, Injection) Active. Hydrochlorothiazide (12.5MG  Capsule, Oral) Active. Levothyroxine Sodium (125MCG Tablet, Oral) Active. Cephalexin (500MG  Capsule, Oral) Active. Pantoprazole Sodium (40MG  Tablet DR, Oral) Active. PredniSONE (20MG  Tablet, Oral) Active. Medications Reconciled  Social History Elbert Ewings, [place]; 02/17/2015 8:36 AM) Alcohol use Remotely quit alcohol use. Caffeine use Carbonated beverages. Illicit drug use Remotely quit drug use. Tobacco use Former smoker.  Family History Elbert Ewings, [place]; 02/17/2015 8:36 AM) Breast Cancer Sister. Diabetes Mellitus Father. Heart disease in female family member before age 30 Heart disease in female family member before age 54 Hypertension Father, Sister. Migraine Headache Daughter. Respiratory Condition Mother.  Pregnancy / Birth History Elbert Ewings, CMA; 02/17/2015 8:36 AM) Age at menarche 58 years. Gravida 5 Irregular periods Maternal age <15 Para 3  Review of Systems Elbert Ewings CMA; 02/17/2015 8:36 AM) General Present- Night Sweats, Weight Gain and Weight Loss. Not Present- Appetite Loss, Chills, Fatigue and Fever. Skin Present- Change in Wart/Mole and Dryness. Not Present- Hives, Jaundice, New Lesions, Non-Healing Wounds, Rash and Ulcer. HEENT Present- Hoarseness, Seasonal Allergies, Sore Throat and Wears glasses/contact lenses. Not Present- Earache, Hearing Loss, Nose Bleed, Oral Ulcers, Ringing in the Ears, Sinus Pain, Visual Disturbances and Yellow Eyes. Respiratory Present- Snoring. Not Present- Bloody sputum, Chronic Cough, Difficulty Breathing and Wheezing. Breast Present- Breast Pain. Not Present- Breast Mass, Nipple Discharge and Skin Changes. Cardiovascular Present- Leg Cramps, Rapid Heart Rate, Shortness of Breath and Swelling of  Extremities. Not Present- Chest Pain, Difficulty Breathing Lying Down and Palpitations. Gastrointestinal Present- Abdominal Pain,  Bloating, Constipation, Difficulty Swallowing and Excessive gas. Not Present- Bloody Stool, Change in Bowel Habits, Chronic diarrhea, Gets full quickly at meals, Hemorrhoids, Indigestion, Nausea, Rectal Pain and Vomiting. Female Genitourinary Present- Frequency, Nocturia and Urgency. Not Present- Painful Urination and Pelvic Pain. Musculoskeletal Present- Back Pain, Joint Stiffness and Swelling of Extremities. Not Present- Joint Pain, Muscle Pain and Muscle Weakness. Neurological Present- Numbness and Tingling. Not Present- Decreased Memory, Fainting, Headaches, Seizures, Tremor, Trouble walking and Weakness. Psychiatric Not Present- Anxiety, Bipolar, Change in Sleep Pattern, Depression, Fearful and Frequent crying. Endocrine Present- Excessive Hunger and Hair Changes. Not Present- Cold Intolerance, Heat Intolerance, Hot flashes and New Diabetes. Hematology Not Present- Easy Bruising, Excessive bleeding, Gland problems, HIV and Persistent Infections.   Vitals Elbert Ewings CMA; 02/17/2015 8:39 AM) 02/17/2015 8:38 AM Weight: 196 lb Height: 64in Body Surface Area: 2 m Body Mass Index: 33.64 kg/m Temp.: 97.33F(Temporal)  Pulse: 102 (Regular)  BP: 122/70 (Sitting, Left Arm, Standard)  Physical Exam General: WN AA F alert and generally healthy appearing. HEENT: Normal. Pupils equal.  Neck: Supple. No mass. Thryoidectomy scar. She has a scar off her right chin from falling out of tree. Lymph Nodes: No supraclavicular or cervical nodes.  Lungs: Clear to auscultation and symmetric breath sounds. Heart: RRR. No murmur or rub.  Breast: Right - reduction scars. I am not sure that I feel the specific mass Left: reduction scars  Abdomen: Soft. No mass. No tenderness. Normal bowel sounds.  Extremities: Good strength and ROM in upper and lower  extremities.  Neurologic: Grossly intact to motor and sensory function. Psychiatric: Has normal mood and affect. Behavior is normal.    Assessment & Plan  1.  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  2. Fe deficiency anemia sees Dr. Dahlia Byes 3. GERD Has seen Dr. Fuller Plan 4. Hyperlipidemia 5. HTN 6. Thyroidectomy at age 26 for benign disease 7. Asthma   Alphonsa Overall, MD, [facility] Pager: [phone] Office phone:  [phone]

## 2015-02-25 ENCOUNTER — Ambulatory Visit
Admission: RE | Admit: 2015-02-25 | Discharge: 2015-02-25 | Disposition: A | Discharge status: Home / Self Care | Payer: BC Managed Care – PPO | Source: Ambulatory Visit | Attending: Surgery | Admitting: Surgery

## 2015-02-25 ENCOUNTER — Ambulatory Visit (HOSPITAL_BASED_OUTPATIENT_CLINIC_OR_DEPARTMENT_OTHER): Payer: BC Managed Care – PPO | Admitting: Anesthesiology

## 2015-02-25 ENCOUNTER — Encounter (HOSPITAL_BASED_OUTPATIENT_CLINIC_OR_DEPARTMENT_OTHER)
Admission: RE | Disposition: A | Discharge status: Home / Self Care | Payer: Self-pay | Source: Ambulatory Visit | Attending: Surgery

## 2015-02-25 ENCOUNTER — Encounter (HOSPITAL_BASED_OUTPATIENT_CLINIC_OR_DEPARTMENT_OTHER): Payer: Self-pay | Admitting: *Deleted

## 2015-02-25 ENCOUNTER — Ambulatory Visit (HOSPITAL_BASED_OUTPATIENT_CLINIC_OR_DEPARTMENT_OTHER)
Admission: RE | Admit: 2015-02-25 | Discharge: 2015-02-25 | Disposition: A | Discharge status: Home / Self Care | Payer: BC Managed Care – PPO | Source: Ambulatory Visit | Attending: Surgery | Admitting: Surgery

## 2015-02-25 DIAGNOSIS — D241 Benign neoplasm of right breast: Secondary | ICD-10-CM | POA: Diagnosis not present

## 2015-02-25 DIAGNOSIS — N641 Fat necrosis of breast: Secondary | ICD-10-CM | POA: Insufficient documentation

## 2015-02-25 DIAGNOSIS — I10 Essential (primary) hypertension: Secondary | ICD-10-CM | POA: Insufficient documentation

## 2015-02-25 DIAGNOSIS — N6310 Unspecified lump in the right breast, unspecified quadrant: Secondary | ICD-10-CM

## 2015-02-25 DIAGNOSIS — Z87891 Personal history of nicotine dependence: Secondary | ICD-10-CM | POA: Insufficient documentation

## 2015-02-25 DIAGNOSIS — E785 Hyperlipidemia, unspecified: Secondary | ICD-10-CM | POA: Insufficient documentation

## 2015-02-25 HISTORY — PX: BREAST LUMPECTOMY WITH RADIOACTIVE SEED LOCALIZATION: SHX6424

## 2015-02-25 HISTORY — DX: Neoplasm of unspecified behavior of breast: D49.3

## 2015-02-25 SURGERY — BREAST LUMPECTOMY WITH RADIOACTIVE SEED LOCALIZATION
Anesthesia: General | Site: Breast | Laterality: Right

## 2015-02-25 MED ORDER — GLYCOPYRROLATE 0.2 MG/ML IJ SOLN: 0.2000 mg | Freq: Once | INTRAMUSCULAR | Status: DC | PRN

## 2015-02-25 MED ORDER — KETOROLAC TROMETHAMINE 30 MG/ML IJ SOLN: 30.0000 mg | Freq: Once | INTRAMUSCULAR | Status: DC

## 2015-02-25 MED ORDER — CHLORHEXIDINE GLUCONATE 4 % EX LIQD: 1.0000 "application " | Freq: Once | CUTANEOUS | Status: DC

## 2015-02-25 MED ORDER — BUPIVACAINE-EPINEPHRINE (PF) 0.25% -1:200000 IJ SOLN
INTRAMUSCULAR | Status: AC
  Filled 2015-02-25: qty 30

## 2015-02-25 MED ORDER — MIDAZOLAM HCL 2 MG/2ML IJ SOLN
INTRAMUSCULAR | Status: AC
  Filled 2015-02-25: qty 4

## 2015-02-25 MED ORDER — PROMETHAZINE HCL 25 MG/ML IJ SOLN: 6.2500 mg | INTRAMUSCULAR | Status: DC | PRN

## 2015-02-25 MED ORDER — LIDOCAINE HCL (CARDIAC) 20 MG/ML IV SOLN
INTRAVENOUS | Status: DC | PRN
  Administered 2015-02-25: 50 mg via INTRAVENOUS

## 2015-02-25 MED ORDER — OXYCODONE HCL 5 MG PO TABS
ORAL_TABLET | ORAL | Status: AC
  Filled 2015-02-25: qty 1

## 2015-02-25 MED ORDER — BUPIVACAINE-EPINEPHRINE 0.25% -1:200000 IJ SOLN
INTRAMUSCULAR | Status: DC | PRN
  Administered 2015-02-25: 30 mL

## 2015-02-25 MED ORDER — OXYCODONE HCL 5 MG PO TABS
5.0000 mg | ORAL_TABLET | Freq: Once | ORAL | Status: AC | PRN
  Administered 2015-02-25: 5 mg via ORAL

## 2015-02-25 MED ORDER — MIDAZOLAM HCL 2 MG/2ML IJ SOLN: 1.0000 mg | INTRAMUSCULAR | Status: DC | PRN

## 2015-02-25 MED ORDER — LIDOCAINE HCL (CARDIAC) 20 MG/ML IV SOLN
INTRAVENOUS | Status: AC
  Filled 2015-02-25: qty 5

## 2015-02-25 MED ORDER — CEFAZOLIN SODIUM-DEXTROSE 2-3 GM-% IV SOLR
2.0000 g | INTRAVENOUS | Status: AC
  Administered 2015-02-25: 2 g via INTRAVENOUS

## 2015-02-25 MED ORDER — SCOPOLAMINE 1 MG/3DAYS TD PT72: 1.0000 | MEDICATED_PATCH | Freq: Once | TRANSDERMAL | Status: DC | PRN

## 2015-02-25 MED ORDER — FENTANYL CITRATE (PF) 100 MCG/2ML IJ SOLN
INTRAMUSCULAR | Status: DC | PRN
  Administered 2015-02-25 (×2): 50 ug via INTRAVENOUS

## 2015-02-25 MED ORDER — FENTANYL CITRATE (PF) 100 MCG/2ML IJ SOLN
INTRAMUSCULAR | Status: AC
  Filled 2015-02-25: qty 4

## 2015-02-25 MED ORDER — LACTATED RINGERS IV SOLN
INTRAVENOUS | Status: DC
  Administered 2015-02-25: 11:00:00 via INTRAVENOUS

## 2015-02-25 MED ORDER — MIDAZOLAM HCL 5 MG/5ML IJ SOLN
INTRAMUSCULAR | Status: DC | PRN
  Administered 2015-02-25: 2 mg via INTRAVENOUS

## 2015-02-25 MED ORDER — FENTANYL CITRATE (PF) 100 MCG/2ML IJ SOLN: 50.0000 ug | INTRAMUSCULAR | Status: DC | PRN

## 2015-02-25 MED ORDER — PROPOFOL 10 MG/ML IV BOLUS
INTRAVENOUS | Status: AC
  Filled 2015-02-25: qty 20

## 2015-02-25 MED ORDER — ONDANSETRON HCL 4 MG/2ML IJ SOLN
INTRAMUSCULAR | Status: DC | PRN
  Administered 2015-02-25: 4 mg via INTRAVENOUS

## 2015-02-25 MED ORDER — DEXAMETHASONE SODIUM PHOSPHATE 4 MG/ML IJ SOLN
INTRAMUSCULAR | Status: DC | PRN
  Administered 2015-02-25: 10 mg via INTRAVENOUS

## 2015-02-25 MED ORDER — OXYCODONE HCL 5 MG/5ML PO SOLN: 5.0000 mg | Freq: Once | ORAL | Status: AC | PRN

## 2015-02-25 MED ORDER — HYDROMORPHONE HCL 1 MG/ML IJ SOLN: 0.2500 mg | INTRAMUSCULAR | Status: DC | PRN

## 2015-02-25 MED ORDER — ONDANSETRON HCL 4 MG/2ML IJ SOLN
INTRAMUSCULAR | Status: AC
  Filled 2015-02-25: qty 2

## 2015-02-25 MED ORDER — DEXAMETHASONE SODIUM PHOSPHATE 10 MG/ML IJ SOLN
INTRAMUSCULAR | Status: AC
  Filled 2015-02-25: qty 1

## 2015-02-25 MED ORDER — CEFAZOLIN SODIUM-DEXTROSE 2-3 GM-% IV SOLR
INTRAVENOUS | Status: AC
  Filled 2015-02-25: qty 50

## 2015-02-25 MED ORDER — HYDROCODONE-ACETAMINOPHEN 5-325 MG PO TABS: 1.0000 | ORAL_TABLET | Freq: Four times a day (QID) | ORAL | Status: DC | PRN

## 2015-02-25 SURGICAL SUPPLY — 54 items
APL SKNCLS STERI-STRIP NONHPOA (GAUZE/BANDAGES/DRESSINGS)
BENZOIN TINCTURE PRP APPL 2/3 (GAUZE/BANDAGES/DRESSINGS) IMPLANT
BINDER BREAST LRG (GAUZE/BANDAGES/DRESSINGS) IMPLANT
BINDER BREAST MEDIUM (GAUZE/BANDAGES/DRESSINGS) IMPLANT
BINDER BREAST XLRG (GAUZE/BANDAGES/DRESSINGS) IMPLANT
BINDER BREAST XXLRG (GAUZE/BANDAGES/DRESSINGS) ×3 IMPLANT
BLADE HEX COATED 2.75 (ELECTRODE) ×3 IMPLANT
BLADE SURG 10 STRL SS (BLADE) ×3 IMPLANT
BLADE SURG 15 STRL LF DISP TIS (BLADE) ×1 IMPLANT
BLADE SURG 15 STRL SS (BLADE) ×2
CANISTER SUC SOCK COL 7IN (MISCELLANEOUS) IMPLANT
CANISTER SUCT 1200ML W/VALVE (MISCELLANEOUS) ×3 IMPLANT
CHLORAPREP W/TINT 26ML (MISCELLANEOUS) ×3 IMPLANT
CLIP TI WIDE RED SMALL 6 (CLIP) IMPLANT
CLOSURE WOUND 1/4X4 (GAUZE/BANDAGES/DRESSINGS)
COVER BACK TABLE 60X90IN (DRAPES) ×3 IMPLANT
COVER MAYO STAND STRL (DRAPES) ×3 IMPLANT
COVER PROBE W GEL 5X96 (DRAPES) ×3 IMPLANT
DECANTER SPIKE VIAL GLASS SM (MISCELLANEOUS) IMPLANT
DEVICE DUBIN W/COMP PLATE 8390 (MISCELLANEOUS) ×3 IMPLANT
DRAPE LAPAROTOMY 100X72 PEDS (DRAPES) ×3 IMPLANT
DRAPE UTILITY XL STRL (DRAPES) ×3 IMPLANT
DRSG PAD ABDOMINAL 8X10 ST (GAUZE/BANDAGES/DRESSINGS) IMPLANT
ELECT COATED BLADE 2.86 ST (ELECTRODE) ×3 IMPLANT
ELECT REM PT RETURN 9FT ADLT (ELECTROSURGICAL) ×3
ELECTRODE REM PT RTRN 9FT ADLT (ELECTROSURGICAL) ×1 IMPLANT
GAUZE SPONGE 4X4 12PLY STRL (GAUZE/BANDAGES/DRESSINGS) ×3 IMPLANT
GLOVE BIOGEL PI IND STRL 7.5 (GLOVE) ×1 IMPLANT
GLOVE BIOGEL PI INDICATOR 7.5 (GLOVE) ×2
GLOVE SURG SIGNA 7.5 PF LTX (GLOVE) ×3 IMPLANT
GLOVE SURG SS PI 7.5 STRL IVOR (GLOVE) ×3 IMPLANT
GOWN STRL REUS W/ TWL LRG LVL3 (GOWN DISPOSABLE) ×1 IMPLANT
GOWN STRL REUS W/ TWL XL LVL3 (GOWN DISPOSABLE) ×1 IMPLANT
GOWN STRL REUS W/TWL LRG LVL3 (GOWN DISPOSABLE) ×2
GOWN STRL REUS W/TWL XL LVL3 (GOWN DISPOSABLE) ×2
KIT MARKER MARGIN INK (KITS) ×3 IMPLANT
LIQUID BAND (GAUZE/BANDAGES/DRESSINGS) ×3 IMPLANT
NEEDLE HYPO 25X1 1.5 SAFETY (NEEDLE) ×3 IMPLANT
NS IRRIG 1000ML POUR BTL (IV SOLUTION) ×3 IMPLANT
PACK BASIN DAY SURGERY FS (CUSTOM PROCEDURE TRAY) ×3 IMPLANT
PENCIL BUTTON HOLSTER BLD 10FT (ELECTRODE) ×3 IMPLANT
SHEET MEDIUM DRAPE 40X70 STRL (DRAPES) IMPLANT
SLEEVE SCD COMPRESS KNEE MED (MISCELLANEOUS) ×3 IMPLANT
SPONGE GAUZE 4X4 12PLY STER LF (GAUZE/BANDAGES/DRESSINGS) ×3 IMPLANT
SPONGE LAP 18X18 X RAY DECT (DISPOSABLE) ×3 IMPLANT
STRIP CLOSURE SKIN 1/4X4 (GAUZE/BANDAGES/DRESSINGS) IMPLANT
SUT MON AB 5-0 PS2 18 (SUTURE) ×3 IMPLANT
SUT VICRYL 3-0 CR8 SH (SUTURE) ×3 IMPLANT
SYR CONTROL 10ML LL (SYRINGE) ×3 IMPLANT
TOWEL OR 17X24 6PK STRL BLUE (TOWEL DISPOSABLE) ×3 IMPLANT
TOWEL OR NON WOVEN STRL DISP B (DISPOSABLE) ×3 IMPLANT
TUBE CONNECTING 20'X1/4 (TUBING) ×1
TUBE CONNECTING 20X1/4 (TUBING) ×2 IMPLANT
YANKAUER SUCT BULB TIP NO VENT (SUCTIONS) ×3 IMPLANT

## 2015-02-25 NOTE — Op Note (Signed)
02/25/2015  1:03 PM  PATIENT:  Marilyn [name] DOB: 04-28-1966 MRN: [id number]  PREOP DIAGNOSIS:  RIGHT BREAST TUMOR  POSTOP DIAGNOSIS:   Right breast lesion, biphasic on biopsy, 7 o'clock position   PROCEDURE:   Procedure(s):  BREAST Excisional biopsy WITH RADIOACTIVE SEED LOCALIZATION  SURGEON:   Alphonsa Overall, M.D.  ANESTHESIA:   general  CRNA: Willa Frater, CRNA; Marrianne Mood, CRNA  General  EBL:  minimal  ml  DRAINS: none   LOCAL MEDICATIONS USED:   30 cc 1/4% marcaine  SPECIMEN:   Left breast biopsy (suture medial)  COUNTS CORRECT:  YES  INDICATIONS FOR PROCEDURE:  Marilyn [name] is a 49 y.o. (DOB: September 20, 1965) AA  female whose primary care physician is [name], MD and comes for right breast excisional biopsy.  She had a right breast on 12/28/2014 which showed a biphasic lesion - she comes for complete excision of this lesion.   We will plan to excise this mass.  Since it is not palpable, will use a seed localization.   She is here with her 2 sons:  Rosaura Carpenter and Richardson.    The indications and potential complications of surgery were explained to the patient. Potential complications include, but are not limited to, bleeding, infection, the need for further surgery, and nerve injury.     She had a I131 seed placed on 02/23/2015 in her right breast at [facility].  I confirmed the presence of the I131 seed in the pre op area using the Neoprobe.  The seed is in the 7 o'clock position of the right breast.  OPERATIVE NOTE:   The patient was taken to room # 8 at [facility] where she underwent a general anesthesia  supervised by CRNA: Willa Frater, CRNA; Marrianne Mood, CRNA. Her right breast and axilla were prepped with  ChloraPrep and sterilely draped.    A time-out and the surgical check list was reviewed.    I turned attention to the mass which was about at the 7 o'clock position of the right breast.  She had had a prior breast reduction,  so I tried to use the old scar to do th biopsy.   I used the Neoprobe to identify the I131 seed.  I tried to excise an area around the tumor of at least 1 cm.    I excised this block of breast tissue approximately 3 cm by 3 cm  in diameter.   I painted the lumpectomy specimen with the 6 color paint kit and did a specimen mammogram which confirmed the mass, clip, and the seed were all in the right position in the specimen.  The specimen was sent to pathology who called back to confirm that they have the seed and the specimen.   I then irrigated the wound with saline. I infiltrated approximately 30 mL of 1/4% local into the breast biopsy cavity.   I then closed all the wounds in layers using 3-0 Vicryl sutures for the deep layer. At the skin, I closed the incisions with a 5-0 Monocryl suture. The incisions were then painted with LiquiBand.  She had gauze place over the wounds and placed in a breast binder.   The patient tolerated the procedure well, was transported to the recovery room in good condition. Sponge and needle count were correct at the end of the case.   Final pathology is pending.   Alphonsa Overall, MD, [facility] Pager: [phone]  Office phone:  [phone]

## 2015-02-25 NOTE — Anesthesia Preprocedure Evaluation (Addendum)
Anesthesia Evaluation  Patient identified by MRN, date of birth, ID band Patient awake    Reviewed: Allergy & Precautions, NPO status , Patient's Chart, lab work & pertinent test results  Airway Mallampati: II  TM Distance: >3 FB Neck ROM: Full    Dental   Pulmonary asthma , former smoker,    breath sounds clear to auscultation       Cardiovascular hypertension, Pt. on medications  Rhythm:Regular Rate:Normal     Neuro/Psych negative neurological ROS     GI/Hepatic Neg liver ROS, hiatal hernia, GERD  ,  Endo/Other  Hypothyroidism Morbid obesity  Renal/GU negative Renal ROS     Musculoskeletal negative musculoskeletal ROS (+)   Abdominal   Peds  Hematology negative hematology ROS (+)   Anesthesia Other Findings   Reproductive/Obstetrics                            Anesthesia Physical Anesthesia Plan  ASA: II  Anesthesia Plan: General   Post-op Pain Management:    Induction: Intravenous  Airway Management Planned: LMA  Additional Equipment:   Intra-op Plan:   Post-operative Plan:   Informed Consent: I have reviewed the patients History and Physical, chart, labs and discussed the procedure including the risks, benefits and alternatives for the proposed anesthesia with the patient or authorized representative who has indicated his/her understanding and acceptance.     Plan Discussed with: CRNA  Anesthesia Plan Comments:         Anesthesia Quick Evaluation

## 2015-02-25 NOTE — Anesthesia Postprocedure Evaluation (Signed)
  Anesthesia Post-op Note  Patient: Marilyn [name]  Procedure(s) Performed: Procedure(s): BREAST LUMPECTOMY WITH RADIOACTIVE SEED LOCALIZATION (Right)  Patient Location: PACU  Anesthesia Type:General  Level of Consciousness: awake and alert   Airway and Oxygen Therapy: Patient Spontanous Breathing  Post-op Pain: none  Post-op Assessment: Post-op Vital signs reviewed              Post-op Vital Signs: Reviewed  Last Vitals:  Filed Vitals:   02/25/15 1430  BP: 109/65  Pulse: 96  Temp:   Resp: 22    Complications: No apparent anesthesia complications

## 2015-02-25 NOTE — Anesthesia Procedure Notes (Signed)
Procedure Name: LMA Insertion Date/Time: 02/25/2015 12:08 PM Performed by: Melynda Ripple D Pre-anesthesia Checklist: Patient identified, Emergency Drugs available, Suction available and Patient being monitored Patient Re-evaluated:Patient Re-evaluated prior to inductionOxygen Delivery Method: Circle System Utilized Preoxygenation: Pre-oxygenation with 100% oxygen Intubation Type: IV induction Ventilation: Mask ventilation without difficulty LMA: LMA inserted LMA Size: 4.0 Number of attempts: 1 Airway Equipment and Method: Bite block Placement Confirmation: positive ETCO2 Tube secured with: Tape Dental Injury: Teeth and Oropharynx as per pre-operative assessment

## 2015-02-25 NOTE — Transfer of Care (Signed)
Immediate Anesthesia Transfer of Care Note  Patient: Marilyn [name]  Procedure(s) Performed: Procedure(s): BREAST LUMPECTOMY WITH RADIOACTIVE SEED LOCALIZATION (Right)  Patient Location: PACU  Anesthesia Type:General  Level of Consciousness: awake and alert   Airway & Oxygen Therapy: Patient Spontanous Breathing and Patient connected to face mask oxygen  Post-op Assessment: Report given to RN and Post -op Vital signs reviewed and stable  Post vital signs: Reviewed and stable  Last Vitals:  Filed Vitals:   02/25/15 1023  BP: 128/80  Pulse: 106  Temp: 36.8 C  Resp: 18    Complications: No apparent anesthesia complications

## 2015-02-25 NOTE — Discharge Instructions (Signed)
[facility] - DISCHARGE INSTRUCTIONS TO PATIENT  Return to work on:  I gave her a note to be out of work until 03/03/2015  Activity:  Driving - May drive in one or 2 days if doing well   Lifting - Take it easy for about one week, then no restrictions  Wound Care:   Leave bandage on for 2 days, then remove and shower  Diet:  As tolerated  Follow up appointment:  Call Dr. Pollie Friar office [facility]) at [phone] for an appointment in 2 to 3 weeks.  Medications and dosages:  Resume your home medications.  You have a prescription for:  vicodin  Call Dr. Lucia Gaskins or his office  [phone]) if you have:  Temperature greater than 100.4,  Persistent nausea and vomiting,  Severe uncontrolled pain,  Redness, tenderness, or signs of infection (pain, swelling, redness, odor or green/yellow discharge around the site),  Difficulty breathing, headache or visual disturbances,  Any other questions or concerns you may have after discharge.  In an emergency, call 911 or go to an Emergency Department at a nearby hospital.    Post Anesthesia Home Care Instructions  Activity: Get plenty of rest for the remainder of the day. A responsible adult should stay with you for 24 hours following the procedure.  For the next 24 hours, DO NOT: -Drive a car -Paediatric nurse -Drink alcoholic beverages -Take any medication unless instructed by your physician -Make any legal decisions or sign important papers.  Meals: Start with liquid foods such as gelatin or soup. Progress to regular foods as tolerated. Avoid greasy, spicy, heavy foods. If nausea and/or vomiting occur, drink only clear liquids until the nausea and/or vomiting subsides. Call your physician if vomiting continues.  Special Instructions/Symptoms: Your throat may feel dry or sore from the anesthesia or the breathing tube placed in your throat during surgery. If this causes discomfort, gargle with warm salt water.  The discomfort should disappear within 24 hours.  If you had a scopolamine patch placed behind your ear for the management of post- operative nausea and/or vomiting:  1. The medication in the patch is effective for 72 hours, after which it should be removed.  Wrap patch in a tissue and discard in the trash. Wash hands thoroughly with soap and water. 2. You may remove the patch earlier than 72 hours if you experience unpleasant side effects which may include dry mouth, dizziness or visual disturbances. 3. Avoid touching the patch. Wash your hands with soap and water after contact with the patch.

## 2015-02-28 ENCOUNTER — Encounter (HOSPITAL_BASED_OUTPATIENT_CLINIC_OR_DEPARTMENT_OTHER): Payer: Self-pay | Admitting: Surgery

## 2015-03-16 ENCOUNTER — Emergency Department (HOSPITAL_COMMUNITY): Payer: BC Managed Care – PPO

## 2015-03-16 ENCOUNTER — Emergency Department (HOSPITAL_COMMUNITY)
Admission: EM | Admit: 2015-03-16 | Discharge: 2015-03-17 | Disposition: A | Discharge status: Home / Self Care | Payer: BC Managed Care – PPO | Attending: Emergency Medicine | Admitting: Emergency Medicine

## 2015-03-16 ENCOUNTER — Encounter (HOSPITAL_COMMUNITY): Payer: Self-pay | Admitting: *Deleted

## 2015-03-16 DIAGNOSIS — E039 Hypothyroidism, unspecified: Secondary | ICD-10-CM | POA: Insufficient documentation

## 2015-03-16 DIAGNOSIS — Z87891 Personal history of nicotine dependence: Secondary | ICD-10-CM | POA: Diagnosis not present

## 2015-03-16 DIAGNOSIS — Z79899 Other long term (current) drug therapy: Secondary | ICD-10-CM | POA: Diagnosis not present

## 2015-03-16 DIAGNOSIS — J45909 Unspecified asthma, uncomplicated: Secondary | ICD-10-CM | POA: Insufficient documentation

## 2015-03-16 DIAGNOSIS — K219 Gastro-esophageal reflux disease without esophagitis: Secondary | ICD-10-CM | POA: Diagnosis not present

## 2015-03-16 DIAGNOSIS — R0789 Other chest pain: Secondary | ICD-10-CM | POA: Diagnosis not present

## 2015-03-16 DIAGNOSIS — I10 Essential (primary) hypertension: Secondary | ICD-10-CM | POA: Insufficient documentation

## 2015-03-16 DIAGNOSIS — Z862 Personal history of diseases of the blood and blood-forming organs and certain disorders involving the immune mechanism: Secondary | ICD-10-CM | POA: Diagnosis not present

## 2015-03-16 DIAGNOSIS — Z853 Personal history of malignant neoplasm of breast: Secondary | ICD-10-CM | POA: Insufficient documentation

## 2015-03-16 DIAGNOSIS — E785 Hyperlipidemia, unspecified: Secondary | ICD-10-CM | POA: Insufficient documentation

## 2015-03-16 LAB — CBC
HCT: 39.7 % (ref 36.0–46.0)
HEMOGLOBIN: 13.1 g/dL (ref 12.0–15.0)
MCH: 27.5 pg (ref 26.0–34.0)
MCHC: 33 g/dL (ref 30.0–36.0)
MCV: 83.4 fL (ref 78.0–100.0)
PLATELETS: 263 10*3/uL (ref 150–400)
RBC: 4.76 MIL/uL (ref 3.87–5.11)
RDW: 12.6 % (ref 11.5–15.5)
WBC: 6 10*3/uL (ref 4.0–10.5)

## 2015-03-16 LAB — BASIC METABOLIC PANEL
ANION GAP: 7 (ref 5–15)
BUN: 19 mg/dL (ref 6–20)
CALCIUM: 9.4 mg/dL (ref 8.9–10.3)
CO2: 28 mmol/L (ref 22–32)
Chloride: 104 mmol/L (ref 101–111)
Creatinine, Ser: 0.96 mg/dL (ref 0.44–1.00)
GFR calc Af Amer: >60 mL/min (ref >60)
GLUCOSE: 104 mg/dL — AB (ref 65–99)
Potassium: 3.6 mmol/L (ref 3.5–5.1)
Sodium: 139 mmol/L (ref 135–145)

## 2015-03-16 LAB — I-STAT TROPONIN, ED: TROPONIN I, POC: 0 ng/mL (ref 0.00–0.08)

## 2015-03-16 MED ORDER — ACETAMINOPHEN 500 MG PO TABS
1000.0000 mg | ORAL_TABLET | Freq: Once | ORAL | Status: AC
  Administered 2015-03-16: 1000 mg via ORAL
  Filled 2015-03-16: qty 2

## 2015-03-16 NOTE — ED Notes (Signed)
Pt states she had breast surgery 2 weeks ago

## 2015-03-16 NOTE — ED Notes (Signed)
Pt states that she began having midsternal chest pain around 5pm; pt states that she had sharp pains that radiated to her back and upper abd; pt states that she is mildly short of breath and feels nauseous with no vomiting; pt reports breast surgery 2 weeks ago

## 2015-03-16 NOTE — ED Notes (Signed)
Pt not currently in her room, delay in blood draw.

## 2015-03-16 NOTE — ED Provider Notes (Signed)
CSN: [id number]     Arrival date & time 03/16/15  2110 History   First MD Initiated Contact with Patient 03/16/15 2244     Chief Complaint  Patient presents with  . Chest Pain     (Consider location/radiation/quality/duration/timing/severity/associated sxs/prior Treatment) HPI Complains of anterior chest pain described as pressure onset 5 PM today pain is worse when she changes positions improve during she remained still. Pain is nonradiating. No back pain no abdominal pain. No treatment prior to coming here. No shortness of breath no nausea no sweatiness. No other associated symptoms Past Medical History  Diagnosis Date  . Hyperlipidemia   . HYPOTHYROIDISM 01/30/2007  . 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/2007  . ASTHMA 01/30/2007  . GERD 01/31/2007  . Hiatal hernia   . [place] tear 04/2003  . Hemorrhoids   . HTN (hypertension) 09/09/2011  . Breast tumor     right   Past Surgical History  Procedure Laterality Date  . Tubal ligation  1987  . Broken jaw    . Breast reduction surgery  1986  . Carpal tunnel release  2001  . Thyroidectomy    . Breast lumpectomy with radioactive seed localization Right 02/25/2015    Procedure: BREAST LUMPECTOMY WITH RADIOACTIVE SEED LOCALIZATION;  Surgeon: Alphonsa Overall, MD;  Location: [facility];  Service: General;  Laterality: Right;   Family History  Problem Relation Age of Onset  . Hypertension Father   . Diabetes Father   . Breast cancer Sister   . Colon cancer Neg Hx   . Diabetes Maternal Grandmother   . Heart disease Maternal Grandmother   . Asthma Mother   . Asthma Maternal Grandfather    Social History  Substance Use Topics  . Smoking status: Former Smoker -- 0.25 packs/day for 3 years    Types: Cigarettes    Quit date: 06/04/1980  . Smokeless tobacco: Never Used  . Alcohol Use: No   OB History    No data available     Review of Systems  Constitutional: Negative.   HENT: Negative.   Respiratory: Negative.    Cardiovascular: Positive for chest pain.  Gastrointestinal: Negative.   Musculoskeletal: Negative.   Skin: Negative.   Neurological: Negative.   Psychiatric/Behavioral: Negative.   All other systems reviewed and are negative.     Allergies  Tuna; Codeine; and Flonase  Home Medications   Prior to Admission medications   Medication Sig Start Date End Date Taking? Authorizing Provider  acetaminophen (TYLENOL) 325 MG tablet Take 325 mg by mouth every 6 (six) hours as needed for moderate pain.   Yes Historical Provider, MD  albuterol (PROVENTIL HFA;VENTOLIN HFA) 108 (90 BASE) MCG/ACT inhaler Inhale into the lungs every 6 (six) hours as needed for wheezing or shortness of breath.   Yes Historical Provider, MD  amLODipine (NORVASC) 10 MG tablet TAKE ONE TABLET BY MOUTH ONCE DAILY 07/20/14  Yes Biagio Borg, MD  atorvastatin (LIPITOR) 20 MG tablet Take 1 tablet (20 mg total) by mouth daily. 04/14/14 04/14/15 Yes Biagio Borg, MD  Cholecalciferol (VITAMIN D3) 5000 UNITS CAPS Take 1 capsule by mouth 2 (two) times a week.   Yes Historical Provider, MD  Cyanocobalamin (VITAMIN B 12 PO) Take 1 tablet by mouth daily.   Yes Historical Provider, MD  EPINEPHrine 0.3 mg/0.3 mL IJ SOAJ injection Inject 0.3 mLs (0.3 mg total) into the muscle once. 11/10/14  Yes Everlene Balls, MD  hydrochlorothiazide (MICROZIDE) 12.5 MG capsule Take 1 capsule (12.5  mg total) by mouth daily. As needed for swelling 04/07/14  Yes Biagio Borg, MD  HYDROcodone-acetaminophen (NORCO/VICODIN) 5-325 MG per tablet Take 1-2 tablets by mouth every 6 (six) hours as needed for severe pain. 02/25/15  Yes Alphonsa Overall, MD  hydroxypropyl methylcellulose / hypromellose (ISOPTO TEARS / GONIOVISC) 2.5 % ophthalmic solution Place 1 drop into both eyes at bedtime.   Yes Historical Provider, MD  lansoprazole (PREVACID) 30 MG capsule Take 1 capsule (30 mg total) by mouth 2 (two) times daily before a meal. 02/22/15  Yes Ladene Artist, MD  levothyroxine  (SYNTHROID, LEVOTHROID) 125 MCG tablet Take 1 tablet (125 mcg total) by mouth daily before breakfast. 04/07/14  Yes Biagio Borg, MD  loratadine (CLARITIN) 10 MG tablet Take 10 mg by mouth daily as needed for allergies.   Yes Historical Provider, MD  Multiple Vitamin (MULTIVITAMIN WITH MINERALS) TABS tablet Take 1 tablet by mouth daily.   Yes Historical Provider, MD   BP 97/73 mmHg  Pulse 82  Temp(Src) 99 F (37.2 C) (Oral)  Resp 18  Ht _0  (1.6 m)  Wt 197 lb (89.359 kg)  BMI 34.91 kg/m2  SpO2 92%  LMP 02/21/2013 Physical Exam  Constitutional: She appears well-developed and well-nourished.  HENT:  Head: Normocephalic and atraumatic.  Eyes: Conjunctivae are normal. Pupils are equal, round, and reactive to light.  Neck: Neck supple. No tracheal deviation present. No thyromegaly present.  Cardiovascular: Normal rate and regular rhythm.   No murmur heard. Pulmonary/Chest: Effort normal and breath sounds normal. She exhibits tenderness.  Chest is tender over sternum. Pain is is reproducible by forceful abduction of left shoulder.  Abdominal: Soft. Bowel sounds are normal. She exhibits no distension. There is no tenderness.  Musculoskeletal: Normal range of motion. She exhibits no edema or tenderness.  Neurological: She is alert. Coordination normal.  Skin: Skin is warm and dry. No rash noted.  Psychiatric: She has a normal mood and affect.  Nursing note and vitals reviewed.   ED Course  Procedures (including critical care time) Labs Review Labs Reviewed  CBC  BASIC METABOLIC PANEL    Imaging Review Dg Chest 2 View  03/16/2015  CLINICAL DATA:  Sudden onset chest pain and shortness of breath this evening. Surgery for benign right breast mass 2 weeks ago. EXAM: CHEST  2 VIEW COMPARISON:  11/15/2014 FINDINGS: Normal heart size and pulmonary vascularity. No focal airspace disease or consolidation in the lungs. No blunting of costophrenic angles. No pneumothorax. Mediastinal contours  appear intact. Degenerative changes in the spine and shoulders. IMPRESSION: No active cardiopulmonary disease. Electronically Signed   By: Lucienne Capers M.D.   On: 03/16/2015 22:12   I have personally reviewed and evaluated these images and lab results as part of my medical decision-making.   EKG Interpretation   Date/Time:  Wednesday March 16 2015 21:43:39 EDT Ventricular Rate:  94 PR Interval:  196 QRS Duration: 72 QT Interval:  370 QTC Calculation: 463 R Axis:   33 Text Interpretation:  Sinus rhythm Low voltage, precordial leads Since  last tracing rate slower Confirmed by Winfred Leeds  MD, Larson Limones [phone]) on  03/16/2015 10:26:46 PM     Chest x-ray viewed by me.  Results for orders placed or performed during the hospital encounter of 05/22/74  Basic metabolic panel  Result Value Ref Range   Sodium 139 135 - 145 mmol/L   Potassium 3.6 3.5 - 5.1 mmol/L   Chloride 104 101 - 111 mmol/L   CO2  28 22 - 32 mmol/L   Glucose, Bld 104 (H) 65 - 99 mg/dL   BUN 19 6 - 20 mg/dL   Creatinine, Ser 0.96 0.44 - 1.00 mg/dL   Calcium 9.4 8.9 - 10.3 mg/dL   GFR calc non Af Amer >60 >60 mL/min   GFR calc Af Amer >60 >60 mL/min   Anion gap 7 5 - 15  CBC  Result Value Ref Range   WBC 6.0 4.0 - 10.5 K/uL   RBC 4.76 3.87 - 5.11 MIL/uL   Hemoglobin 13.1 12.0 - 15.0 g/dL   HCT 39.7 36.0 - 46.0 %   MCV 83.4 78.0 - 100.0 fL   MCH 27.5 26.0 - 34.0 pg   MCHC 33.0 30.0 - 36.0 g/dL   RDW 12.6 11.5 - 15.5 %   Platelets 263 150 - 400 K/uL  I-stat troponin, ED  Result Value Ref Range   Troponin i, poc 0.00 0.00 - 0.08 ng/mL   Comment 3           Dg Chest 2 View  03/16/2015  CLINICAL DATA:  Sudden onset chest pain and shortness of breath this evening. Surgery for benign right breast mass 2 weeks ago. EXAM: CHEST  2 VIEW COMPARISON:  11/15/2014 FINDINGS: Normal heart size and pulmonary vascularity. No focal airspace disease or consolidation in the lungs. No blunting of costophrenic angles. No  pneumothorax. Mediastinal contours appear intact. Degenerative changes in the spine and shoulders. IMPRESSION: No active cardiopulmonary disease. Electronically Signed   By: Lucienne Capers M.D.   On: 03/16/2015 22:12   Mm Breast Surgical Specimen  02/25/2015  CLINICAL DATA:  Right breast mass EXAM: SPECIMEN RADIOGRAPH OF THE RIGHT BREAST COMPARISON:  Previous exam(s). FINDINGS: Status post excision of the right breast. The radioactive seed and biopsy marker clip are present, completely intact, and were marked for pathology. IMPRESSION: Specimen radiograph of the right breast. Electronically Signed   By: Skipper Cliche M.D.   On: 02/25/2015 12:52   Mm Rt Radioactive Seed Loc Mammo Guide  02/23/2015  CLINICAL DATA:  Preoperative localization prior to surgical excisional biopsy right breast. EXAM: MAMMOGRAPHIC GUIDED RADIOACTIVE SEED LOCALIZATION OF THE RIGHT BREAST COMPARISON:  Previous exam(s). FINDINGS: Patient presents for radioactive seed localization prior to right breast surgical excision. I met with the patient and we discussed the procedure of seed localization including benefits and alternatives. We discussed the high likelihood of a successful procedure. We discussed the risks of the procedure including infection, bleeding, tissue injury and further surgery. We discussed the low dose of radioactivity involved in the procedure. Informed, written consent was given. The usual time-out protocol was performed immediately prior to the procedure. Using mammographic guidance, sterile technique, 2% lidocaine and an I-125 radioactive seed, ribbon shaped clip and mass was localized using a lateral approach. The follow-up mammogram images confirm the seed in the expected location and were marked for Dr. Lucia Gaskins. Follow-up survey of the patient confirms presence of the radioactive seed. Order number of I-125 seed:  [id number]. Total activity:  0.249 mCi  Reference Date: 01/21/2015 The patient tolerated the  procedure well and was released from the [facility]. She was given instructions regarding seed removal. IMPRESSION: Radioactive seed localization right breast. No apparent complications. Electronically Signed   By: Lovey Newcomer M.D.   On: 02/23/2015 15:21    12:05 AM pain improved after treatment with Tylenol. MDM  Cardiac risk factors include hypertension otherwise negative. Patient signed out toDr. Kathrynn Humble 12:10 AM. Strongly suspect  musculoskeletal pain as etiology. Diagnosis chest pain Final diagnoses:  None        Orlie Dakin, MD 03/17/15 3729

## 2015-03-17 LAB — I-STAT TROPONIN, ED: TROPONIN I, POC: 0.01 ng/mL (ref 0.00–0.08)

## 2015-03-17 NOTE — Discharge Instructions (Signed)
Nonspecific Chest Pain  °Chest pain can be caused by many different conditions. There is always a chance that your pain could be related to something serious, such as a heart attack or a blood clot in your lungs. Chest pain can also be caused by conditions that are not life-threatening. If you have chest pain, it is very important to follow up with your health care provider. °CAUSES  °Chest pain can be caused by: °· Heartburn. °· Pneumonia or bronchitis. °· Anxiety or stress. °· Inflammation around your heart (pericarditis) or lung (pleuritis or pleurisy). °· A blood clot in your lung. °· A collapsed lung (pneumothorax). It can develop suddenly on its own (spontaneous pneumothorax) or from trauma to the chest. °· Shingles infection (varicella-zoster virus). °· Heart attack. °· Damage to the bones, muscles, and cartilage that make up your chest wall. This can include: °¨ Bruised bones due to injury. °¨ Strained muscles or cartilage due to frequent or repeated coughing or overwork. °¨ Fracture to one or more ribs. °¨ Sore cartilage due to inflammation (costochondritis). °RISK FACTORS  °Risk factors for chest pain may include: °· Activities that increase your risk for trauma or injury to your chest. °· Respiratory infections or conditions that cause frequent coughing. °· Medical conditions or overeating that can cause heartburn. °· Heart disease or family history of heart disease. °· Conditions or health behaviors that increase your risk of developing a blood clot. °· Having had chicken pox (varicella zoster). °SIGNS AND SYMPTOMS °Chest pain can feel like: °· Burning or tingling on the surface of your chest or deep in your chest. °· Crushing, pressure, aching, or squeezing pain. °· Dull or sharp pain that is worse when you move, cough, or take a deep breath. °· Pain that is also felt in your back, neck, shoulder, or arm, or pain that spreads to any of these areas. °Your chest pain may come and go, or it may stay  constant. °DIAGNOSIS °Lab tests or other studies may be needed to find the cause of your pain. Your health care provider may have you take a test called an ambulatory ECG (electrocardiogram). An ECG records your heartbeat patterns at the time the test is performed. You may also have other tests, such as: °· Transthoracic echocardiogram (TTE). During echocardiography, sound waves are used to create a picture of all of the heart structures and to look at how blood flows through your heart. °· Transesophageal echocardiogram (TEE). This is a more advanced imaging test that obtains images from inside your body. It allows your health care provider to see your heart in finer detail. °· Cardiac monitoring. This allows your health care provider to monitor your heart rate and rhythm in real time. °· Holter monitor. This is a portable device that records your heartbeat and can help to diagnose abnormal heartbeats. It allows your health care provider to track your heart activity for several days, if needed. °· Stress tests. These can be done through exercise or by taking medicine that makes your heart beat more quickly. °· Blood tests. °· Imaging tests. °TREATMENT  °Your treatment depends on what is causing your chest pain. Treatment may include: °· Medicines. These may include: °¨ Acid blockers for heartburn. °¨ Anti-inflammatory medicine. °¨ Pain medicine for inflammatory conditions. °¨ Antibiotic medicine, if an infection is present. °¨ Medicines to dissolve blood clots. °¨ Medicines to treat coronary artery disease. °· Supportive care for conditions that do not require medicines. This may include: °¨ Resting. °¨ Applying heat   or cold packs to injured areas. °¨ Limiting activities until pain decreases. °HOME CARE INSTRUCTIONS °· If you were prescribed an antibiotic medicine, finish it all even if you start to feel better. °· Avoid any activities that bring on chest pain. °· Do not use any tobacco products, including  cigarettes, chewing tobacco, or electronic cigarettes. If you need help quitting, ask your health care provider. °· Do not drink alcohol. °· Take medicines only as directed by your health care provider. °· Keep all follow-up visits as directed by your health care provider. This is important. This includes any further testing if your chest pain does not go away. °· If heartburn is the cause for your chest pain, you may be told to keep your head raised (elevated) while sleeping. This reduces the chance that acid will go from your stomach into your esophagus. °· Make lifestyle changes as directed by your health care provider. These may include: °¨ Getting regular exercise. Ask your health care provider to suggest some activities that are safe for you. °¨ Eating a heart-healthy diet. A registered dietitian can help you to learn healthy eating options. °¨ Maintaining a healthy weight. °¨ Managing diabetes, if necessary. °¨ Reducing stress. °SEEK MEDICAL CARE IF: °· Your chest pain does not go away after treatment. °· You have a rash with blisters on your chest. °· You have a fever. °SEEK IMMEDIATE MEDICAL CARE IF:  °· Your chest pain is worse. °· You have an increasing cough, or you cough up blood. °· You have severe abdominal pain. °· You have severe weakness. °· You faint. °· You have chills. °· You have sudden, unexplained chest discomfort. °· You have sudden, unexplained discomfort in your arms, back, neck, or jaw. °· You have shortness of breath at any time. °· You suddenly start to sweat, or your skin gets clammy. °· You feel nauseous or you vomit. °· You suddenly feel light-headed or dizzy. °· Your heart begins to beat quickly, or it feels like it is skipping beats. °These symptoms may represent a serious problem that is an emergency. Do not wait to see if the symptoms will go away. Get medical help right away. Call your local emergency services (911 in the [place] Do not drive yourself to the hospital. °  °This  information is not intended to replace advice given to you by your health care provider. Make sure you discuss any questions you have with your health care provider. °  °Document Released: 02/28/2005 Document Revised: 06/11/2014 Document Reviewed: 12/25/2013 °Elsevier Interactive Patient Education ©2016 [employer] ° °

## 2015-04-06 ENCOUNTER — Encounter: Payer: Self-pay | Admitting: Gastroenterology

## 2015-04-06 ENCOUNTER — Other Ambulatory Visit: Payer: Self-pay

## 2015-04-06 ENCOUNTER — Ambulatory Visit (AMBULATORY_SURGERY_CENTER): Payer: BC Managed Care – PPO | Admitting: Gastroenterology

## 2015-04-06 VITALS — BP 119/86 | HR 76 | Temp 97.5°F | Resp 15 | Ht 62.0 in | Wt 198.0 lb

## 2015-04-06 DIAGNOSIS — R1310 Dysphagia, unspecified: Secondary | ICD-10-CM

## 2015-04-06 DIAGNOSIS — R1314 Dysphagia, pharyngoesophageal phase: Secondary | ICD-10-CM | POA: Diagnosis not present

## 2015-04-06 DIAGNOSIS — F458 Other somatoform disorders: Secondary | ICD-10-CM

## 2015-04-06 DIAGNOSIS — K219 Gastro-esophageal reflux disease without esophagitis: Secondary | ICD-10-CM | POA: Diagnosis not present

## 2015-04-06 DIAGNOSIS — R0989 Other specified symptoms and signs involving the circulatory and respiratory systems: Secondary | ICD-10-CM

## 2015-04-06 MED ORDER — SODIUM CHLORIDE 0.9 % IV SOLN: 500.0000 mL | INTRAVENOUS | Status: DC

## 2015-04-06 NOTE — Progress Notes (Signed)
Stable to RR Coughing  HOB elevated awake

## 2015-04-06 NOTE — Progress Notes (Signed)
Called to room to assist during endoscopic procedure.  Patient ID and intended procedure confirmed with present staff. Received instructions for my participation in the procedure from the performing physician.  

## 2015-04-06 NOTE — Op Note (Signed)
[facility]  [address] [place], [address]   ENDOSCOPY PROCEDURE REPORT  PATIENT: Marilyn [name], Marilyn [name]  MR#: [id number] BIRTHDATE: [birth year] , 49  yrs. old GENDER: female ENDOSCOPIST: Ladene Artist, MD, [facility] REFERRED BY:  [name], M.D. PROCEDURE DATE:  04/06/2015 PROCEDURE:  EGD w/ wire guided (savary) dilation ASA CLASS:     Class II INDICATIONS:  history of esophageal reflux, dysphagia, and globus sensation. MEDICATIONS: Propofol 150 mg IV and lidocaine 40 mg IV TOPICAL ANESTHETIC: none DESCRIPTION OF PROCEDURE: After the risks benefits and alternatives of the procedure were thoroughly explained, informed consent was obtained.  The LB [id number] endoscope was introduced through the mouth and advanced to the second portion of the duodenum , Without limitations.  The instrument was slowly withdrawn as the mucosa was fully examined.    ESOPHAGUS: The mucosa of the esophagus appeared normal.  The esophagus was dilated using a 57mm (48Fr) savary dilator over guidewire for dysphagia without a stricture. STOMACH: The mucosa and folds of the stomach appeared normal. DUODENUM: The duodenal mucosa showed no abnormalities in the bulb and 2nd part of the duodenum.  Retroflexed views revealed a small hiatal hernia.  The scope was then withdrawn from the patient and the procedure completed.  COMPLICATIONS: There were no immediate complications.  ENDOSCOPIC IMPRESSION: 1.   The mucosa of the esophagus appeared normal; dilation using a 44mm (48Fr) savary dilator over guidewire 2.   Small hiatal hernia 3.   The EGD otherwise appeared normal  RECOMMENDATIONS: 1.  Anti-reflux regimen 2.  Continue PPI bid 3.  Post dilation instructions 4.  ENT referral 5.  Schedule MBSS and barium esophagram  [R eSigned:  Ladene Artist, MD, [facility] 04/06/2015 2:55 PM

## 2015-04-06 NOTE — Patient Instructions (Addendum)
Impressions/recommendations:  Hiatal hernia (handout given) Dilation (dilation diet instructions given)  Continue Prevacid twice daily.  YOU HAD AN ENDOSCOPIC PROCEDURE TODAY AT [facility]:   Refer to the procedure report that was given to you for any specific questions about what was found during the examination.  If the procedure report does not answer your questions, please call your gastroenterologist to clarify.  If you requested that your care partner not be given the details of your procedure findings, then the procedure report has been included in a sealed envelope for you to review at your convenience later.  YOU SHOULD EXPECT: Some feelings of bloating in the abdomen. Passage of more gas than usual.  Walking can help get rid of the air that was put into your GI tract during the procedure and reduce the bloating. If you had a lower endoscopy (such as a colonoscopy or flexible sigmoidoscopy) you may notice spotting of blood in your stool or on the toilet paper. If you underwent a bowel prep for your procedure, you may not have a normal bowel movement for a few days.  Please Note:  You might notice some irritation and congestion in your nose or some drainage.  This is from the oxygen used during your procedure.  There is no need for concern and it should clear up in a day or so.  SYMPTOMS TO REPORT IMMEDIATELY:   Following upper endoscopy (EGD)  Vomiting of blood or coffee ground material  New chest pain or pain under the shoulder blades  Painful or persistently difficult swallowing  New shortness of breath  Fever of 100F or higher  Black, tarry-looking stools  For urgent or emergent issues, a gastroenterologist can be reached at any hour by calling [phone].   DIET: Your first meal following the procedure should be a small meal and then it is ok to progress to your normal diet. Heavy or fried foods are harder to digest and may make you feel nauseous or  bloated.  Likewise, meals heavy in dairy and vegetables can increase bloating.  Drink plenty of fluids but you should avoid alcoholic beverages for 24 hours.  ACTIVITY:  You should plan to take it easy for the rest of today and you should NOT DRIVE or use heavy machinery until tomorrow (because of the sedation medicines used during the test).    FOLLOW UP: Our staff will call the number listed on your records the next business day following your procedure to check on you and address any questions or concerns that you may have regarding the information given to you following your procedure. If we do not reach you, we will leave a message.  However, if you are feeling well and you are not experiencing any problems, there is no need to return our call.  We will assume that you have returned to your regular daily activities without incident.  If any biopsies were taken you will be contacted by phone or by letter within the next 1-3 weeks.  Please call us at [phone] if you have not heard about the biopsies in 3 weeks.    SIGNATURES/CONFIDENTIALITY: You and/or your care partner have signed paperwork which will be entered into your electronic medical record.  These signatures attest to the fact that that the information above on your After Visit Summary has been reviewed and is understood.  Full responsibility of the confidentiality of this discharge information lies with you and/or your care-partner.

## 2015-04-07 ENCOUNTER — Telehealth: Payer: Self-pay | Admitting: *Deleted

## 2015-04-07 ENCOUNTER — Other Ambulatory Visit (HOSPITAL_COMMUNITY): Payer: Self-pay | Admitting: Gastroenterology

## 2015-04-07 ENCOUNTER — Telehealth: Payer: Self-pay

## 2015-04-07 NOTE — Telephone Encounter (Signed)
  Follow up Call-  Call back number 04/06/2015  Post procedure Call Back phone  # [phone]  Permission to leave phone message Yes     Patient questions:  Do you have a fever, pain , or abdominal swelling? No. Pain Score  0 *  Have you tolerated food without any problems? Yes.    Have you been able to return to your normal activities? Yes.    Do you have any questions about your discharge instructions: Diet   No. Medications  No. Follow up visit  No.  Do you have questions or concerns about your Care? No.  Actions: * If pain score is 4 or above: No action needed, pain <4.

## 2015-04-07 NOTE — Telephone Encounter (Signed)
Per procedure report from 04/06/15 patient to be scheduled for BS and MBSS and ENT referral.  .   Patient is scheduled at [facility] for 1:00 barium swallow and 1:30 MBSS.  The is advised to arrive at 12:45 and be 3 hours NPO.   She is also notified of appt with Dr. Radene Journey ENT for 04/11/15 1:30.

## 2015-04-08 ENCOUNTER — Other Ambulatory Visit (HOSPITAL_COMMUNITY): Payer: Self-pay | Admitting: Gastroenterology

## 2015-04-08 DIAGNOSIS — R1310 Dysphagia, unspecified: Secondary | ICD-10-CM

## 2015-04-15 ENCOUNTER — Telehealth: Payer: Self-pay | Admitting: Internal Medicine

## 2015-04-15 ENCOUNTER — Ambulatory Visit (HOSPITAL_COMMUNITY)
Admission: RE | Admit: 2015-04-15 | Discharge: 2015-04-15 | Disposition: A | Discharge status: Home / Self Care | Payer: BC Managed Care – PPO | Source: Ambulatory Visit | Attending: Gastroenterology | Admitting: Gastroenterology

## 2015-04-15 DIAGNOSIS — R1310 Dysphagia, unspecified: Secondary | ICD-10-CM | POA: Diagnosis present

## 2015-04-15 DIAGNOSIS — K449 Diaphragmatic hernia without obstruction or gangrene: Secondary | ICD-10-CM | POA: Diagnosis not present

## 2015-04-15 MED ORDER — ATORVASTATIN CALCIUM 20 MG PO TABS: 20.0000 mg | ORAL_TABLET | Freq: Every day | ORAL | Status: DC

## 2015-04-15 NOTE — Telephone Encounter (Signed)
Pt is requesting to combine water pill and BP medication, please advise

## 2015-04-15 NOTE — Telephone Encounter (Signed)
Pharmacy is [facility] on [address]. Patient asks for you to give her a call. She wants to combine her water pill and cholesterol med into 1. She also requests a refill for atorvastatin

## 2015-04-15 NOTE — Telephone Encounter (Signed)
unfort her meds (amlodipine and HCT) do not come in that option, thanks

## 2015-04-15 NOTE — Progress Notes (Signed)
MBSS complete. Full report located under chart review in imaging section.  [name] MA, CCC-SLP [phone]   

## 2015-04-17 ENCOUNTER — Other Ambulatory Visit: Payer: Self-pay | Admitting: Internal Medicine

## 2015-04-22 ENCOUNTER — Other Ambulatory Visit (HOSPITAL_COMMUNITY): Payer: BC Managed Care – PPO

## 2015-04-22 ENCOUNTER — Ambulatory Visit (HOSPITAL_COMMUNITY): Payer: BC Managed Care – PPO

## 2015-05-16 ENCOUNTER — Other Ambulatory Visit: Payer: Self-pay | Admitting: Internal Medicine

## 2015-05-18 ENCOUNTER — Encounter (HOSPITAL_COMMUNITY): Payer: Self-pay | Admitting: Vascular Surgery

## 2015-05-18 ENCOUNTER — Emergency Department (HOSPITAL_COMMUNITY)
Admission: EM | Admit: 2015-05-18 | Discharge: 2015-05-18 | Disposition: A | Discharge status: Home / Self Care | Payer: BC Managed Care – PPO | Attending: Emergency Medicine | Admitting: Emergency Medicine

## 2015-05-18 DIAGNOSIS — R42 Dizziness and giddiness: Secondary | ICD-10-CM | POA: Diagnosis not present

## 2015-05-18 DIAGNOSIS — R202 Paresthesia of skin: Secondary | ICD-10-CM | POA: Diagnosis not present

## 2015-05-18 DIAGNOSIS — Z86018 Personal history of other benign neoplasm: Secondary | ICD-10-CM | POA: Insufficient documentation

## 2015-05-18 DIAGNOSIS — E039 Hypothyroidism, unspecified: Secondary | ICD-10-CM | POA: Diagnosis not present

## 2015-05-18 DIAGNOSIS — I10 Essential (primary) hypertension: Secondary | ICD-10-CM | POA: Diagnosis not present

## 2015-05-18 DIAGNOSIS — E785 Hyperlipidemia, unspecified: Secondary | ICD-10-CM | POA: Insufficient documentation

## 2015-05-18 DIAGNOSIS — Z79899 Other long term (current) drug therapy: Secondary | ICD-10-CM | POA: Diagnosis not present

## 2015-05-18 DIAGNOSIS — K219 Gastro-esophageal reflux disease without esophagitis: Secondary | ICD-10-CM | POA: Diagnosis not present

## 2015-05-18 DIAGNOSIS — R51 Headache: Secondary | ICD-10-CM | POA: Diagnosis not present

## 2015-05-18 DIAGNOSIS — Z87891 Personal history of nicotine dependence: Secondary | ICD-10-CM | POA: Insufficient documentation

## 2015-05-18 DIAGNOSIS — J45909 Unspecified asthma, uncomplicated: Secondary | ICD-10-CM | POA: Insufficient documentation

## 2015-05-18 DIAGNOSIS — Z862 Personal history of diseases of the blood and blood-forming organs and certain disorders involving the immune mechanism: Secondary | ICD-10-CM | POA: Insufficient documentation

## 2015-05-18 DIAGNOSIS — Z7982 Long term (current) use of aspirin: Secondary | ICD-10-CM | POA: Diagnosis not present

## 2015-05-18 DIAGNOSIS — M79602 Pain in left arm: Secondary | ICD-10-CM | POA: Diagnosis present

## 2015-05-18 MED ORDER — TETRACAINE HCL 0.5 % OP SOLN
1.0000 [drp] | Freq: Once | OPHTHALMIC | Status: AC
  Administered 2015-05-18: 1 [drp] via OPHTHALMIC
  Filled 2015-05-18: qty 2

## 2015-05-18 MED ORDER — FLUORESCEIN SODIUM 1 MG OP STRP
1.0000 | ORAL_STRIP | Freq: Once | OPHTHALMIC | Status: AC
  Administered 2015-05-18: 1 via OPHTHALMIC
  Filled 2015-05-18: qty 1

## 2015-05-18 NOTE — ED Notes (Signed)
Pt comfortable with discharge and follow up instructions. Pt declines wheelchair, escorted to waiting area by this RN. No prescriptions. 

## 2015-05-18 NOTE — ED Provider Notes (Signed)
CSN: [id number]     Arrival date & time 05/18/15  1215 History   First MD Initiated Contact with Patient 05/18/15 1329     Chief Complaint  Patient presents with  . Arm Pain    HPI   Marilyn [name] is a 49 y.o. female with a PMH of HLD, hypothyroidism, anemia, HTN, asthma, GERD who presents to the ED with tingling and pain to her left hand and left lower extremity which occurred prior to arrival and lasted for approximately 2 seconds. She states she was standing at school when her symptoms started. She reports it felt like she was being shocked by electricity. She reports persistent burning sensation in her left first through third fingers. She denies fever, chills, chest pain, shortness of breath, N/V, neck pain, back pain. She reports headache and intermittent dizziness last week, which she states is now resolved and attributes to a sinus infection.    Past Medical History  Diagnosis Date  . Hyperlipidemia   . HYPOTHYROIDISM 01/30/2007  . 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/2007  . ASTHMA 01/30/2007  . GERD 01/31/2007  . Hiatal hernia   . [place] tear 04/2003  . Hemorrhoids   . HTN (hypertension) 09/09/2011  . Breast tumor     right   Past Surgical History  Procedure Laterality Date  . Tubal ligation  1987  . Broken jaw    . Breast reduction surgery  1986  . Carpal tunnel release  2001  . Thyroidectomy    . Breast lumpectomy with radioactive seed localization Right 02/25/2015    Procedure: BREAST LUMPECTOMY WITH RADIOACTIVE SEED LOCALIZATION;  Surgeon: Alphonsa Overall, MD;  Location: [facility];  Service: General;  Laterality: Right;   Family History  Problem Relation Age of Onset  . Hypertension Father   . Diabetes Father   . Breast cancer Sister   . Colon cancer Neg Hx   . Diabetes Maternal Grandmother   . Heart disease Maternal Grandmother   . Asthma Mother   . Asthma Maternal Grandfather    Social History  Substance Use Topics  . Smoking status: Former  Smoker -- 0.25 packs/day for 3 years    Types: Cigarettes    Quit date: 06/04/1980  . Smokeless tobacco: Never Used  . Alcohol Use: No   OB History    No data available      Review of Systems  Constitutional: Negative for fever and chills.  Eyes: Negative for visual disturbance.  Respiratory: Negative for shortness of breath.   Cardiovascular: Negative for chest pain.  Musculoskeletal: Positive for arthralgias. Negative for back pain and neck pain.  Neurological: Positive for dizziness and headaches. Negative for weakness and numbness.  All other systems reviewed and are negative.     Allergies  Tuna; Codeine; and Flonase  Home Medications   Prior to Admission medications   Medication Sig Start Date End Date Taking? Authorizing Provider  amLODipine (NORVASC) 10 MG tablet TAKE ONE TABLET BY MOUTH ONCE DAILY 07/20/14  Yes Biagio Borg, MD  aspirin 325 MG tablet Take 325 mg by mouth daily.   Yes Historical Provider, MD  atorvastatin (LIPITOR) 20 MG tablet Take 1 tablet (20 mg total) by mouth daily. 04/15/15 04/14/16 Yes Biagio Borg, MD  Cholecalciferol (VITAMIN D3) 5000 UNITS CAPS Take 1 capsule by mouth 2 (two) times a week.   Yes Historical Provider, MD  Cyanocobalamin (VITAMIN B 12 PO) Take 1 tablet by mouth daily.   Yes Historical  Provider, MD  hydrochlorothiazide (MICROZIDE) 12.5 MG capsule Take 1 capsule (12.5 mg total) by mouth daily. As needed for swelling 04/07/14  Yes Biagio Borg, MD  hydroxypropyl methylcellulose / hypromellose (ISOPTO TEARS / GONIOVISC) 2.5 % ophthalmic solution Place 1 drop into both eyes at bedtime.   Yes Historical Provider, MD  lansoprazole (PREVACID) 30 MG capsule Take 1 capsule (30 mg total) by mouth 2 (two) times daily before a meal. 02/22/15  Yes Ladene Artist, MD  levothyroxine (SYNTHROID, LEVOTHROID) 125 MCG tablet TAKE ONE TABLET BY MOUTH ONCE DAILY BEFORE BREAKFAST 05/16/15  Yes Biagio Borg, MD  loratadine (CLARITIN) 10 MG tablet Take 10  mg by mouth daily as needed for allergies.   Yes Historical Provider, MD  Multiple Vitamin (MULTIVITAMIN WITH MINERALS) TABS tablet Take 1 tablet by mouth daily.   Yes Historical Provider, MD  VENTOLIN HFA 108 (90 BASE) MCG/ACT inhaler INHALE TWO PUFFS BY MOUTH EVERY 4 HOURS AS NEEDED FOR WHEEZING OR SHORTNESS OF BREATH 05/16/15  Yes Biagio Borg, MD  acetaminophen (TYLENOL) 325 MG tablet Take 325 mg by mouth every 6 (six) hours as needed for moderate pain.    Historical Provider, MD  EPINEPHrine 0.3 mg/0.3 mL IJ SOAJ injection Inject 0.3 mLs (0.3 mg total) into the muscle once. 11/10/14   Everlene Balls, MD    BP 123/91 mmHg  Pulse 86  Temp(Src) 98.3 F (36.8 C) (Oral)  Resp 16  Ht 5\' 4"  (1.626 m)  Wt 92.08 kg  BMI 34.83 kg/m2  SpO2 100%  LMP 02/21/2013 Physical Exam  Constitutional: She is oriented to person, place, and time. She appears well-developed and well-nourished. No distress.  HENT:  Head: Normocephalic and atraumatic.  Right Ear: External ear normal.  Left Ear: External ear normal.  Nose: Nose normal.  Mouth/Throat: Uvula is midline, oropharynx is clear and moist and mucous membranes are normal.  Eyes: Conjunctivae, EOM and lids are normal. Pupils are equal, round, and reactive to light. Right eye exhibits no discharge. Left eye exhibits no discharge. Right conjunctiva is not injected. Right conjunctiva has no hemorrhage. Left conjunctiva is not injected. Left conjunctiva has no hemorrhage. No scleral icterus.  Slit lamp exam:      The right eye shows no corneal abrasion, no corneal ulcer and no fluorescein uptake.       The left eye shows no corneal abrasion, no corneal ulcer and no fluorescein uptake.  Neck: Normal range of motion. Neck supple.  Cardiovascular: Normal rate, regular rhythm, normal heart sounds, intact distal pulses and normal pulses.   Pulmonary/Chest: Effort normal and breath sounds normal. No respiratory distress. She has no wheezes. She has no rales.   Abdominal: Soft. Normal appearance and bowel sounds are normal. She exhibits no distension and no mass. There is no tenderness. There is no rigidity, no rebound and no guarding.  Musculoskeletal: Normal range of motion. She exhibits no edema or tenderness.  Neurological: She is alert and oriented to person, place, and time. She has normal strength. No cranial nerve deficit or sensory deficit.  Skin: Skin is warm, dry and intact. No rash noted. She is not diaphoretic. No erythema. No pallor.  Psychiatric: She has a normal mood and affect. Her speech is normal and behavior is normal.  Nursing note and vitals reviewed.   ED Course  Procedures (including critical care time)  Labs Review Labs Reviewed - No data to display  Imaging Review No results found.     EKG  Interpretation None      MDM   Final diagnoses:  Left hand paresthesia  Paresthesia of left lower extremity    49 year old female presents with pain and paresthesia to her left hand and left lower extremity, which occurred prior to arrival and lasted for approximately 2 seconds. Currently, she reports a burning sensation to her left first through third fingers. Otherwise, she states she has no complaints. Denies fever, chills, chest pain, shortness of breath, N/V, neck pain, back pain.  Patient is afebrile. Vital signs stable. Heart regular rate and rhythm. Lungs clear to auscultation bilaterally. Abdomen soft, nontender, nondistended. Normal neuro exam with no focal deficit. Patient moves all extremities without difficulty. Grip strength intact. Sensation to light touch intact. Distal pulses intact.  Patient now complaining of left eye itching and pain. Will give tetracaine and fluorescein and examine eye. On reassessment, patient states symptom resolved. No evidence of abrasion. IOP 13 OD, 9 OS. Doubt conjunctivitis or other infectious process. Patient to follow-up with PCP for further evaluation and management. Return  precautions discussed. Patient verbalizes her understanding and is in agreement with plan.  BP 123/91 mmHg  Pulse 86  Temp(Src) 98.3 F (36.8 C) (Oral)  Resp 16  Ht 5\' 4"  (1.626 m)  Wt 92.08 kg  BMI 34.83 kg/m2  SpO2 100%  LMP 02/21/2013     Marella Chimes, PA-C 05/18/15 1956  Davonna Belling, MD 05/19/15 [phone]

## 2015-05-18 NOTE — ED Notes (Signed)
Pt reports to the ED for eval of sudden sharp, electrical pain that started in her left hand and fingers and then radiated down into her left leg. She reports with the pain she had some left leg and arm twitching. Episode lasted approx 2 seconds. She reports after the pain her left 1st, 2nd, and 3rd finger feel burned and have some numbness. Denies any neck pain or HA at this time. Pt A&Ox4, resp e/u, and skin warm and dry.

## 2015-05-18 NOTE — Discharge Instructions (Signed)
1. Medications: usual home medications 2. Treatment: rest, drink plenty of fluids 3. Follow Up: please followup with your primary doctor for discussion of your diagnoses and further evaluation after today's visit; if you do not have a primary care doctor use the resource guide provided to find one; please return to the ER for chest pain, shortness of breath, numbness, weakness, new or worsening symptoms   Paresthesia Paresthesia is an abnormal burning or prickling sensation. This sensation is generally felt in the hands, arms, legs, or feet. However, it may occur in any part of the body. Usually, it is not painful. The feeling may be described as:  Tingling or numbness.  Pins and needles.  Skin crawling.  Buzzing.  Limbs falling asleep.  Itching. Most people experience temporary (transient) paresthesia at some time in their lives. Paresthesia may occur when you breathe too quickly (hyperventilation). It can also occur without any apparent cause. Commonly, paresthesia occurs when pressure is placed on a nerve. The sensation quickly goes away after the pressure is removed. For some people, however, paresthesia is a long-lasting (chronic) condition that is caused by an underlying disorder. If you continue to have paresthesia, you may need further medical evaluation. HOME CARE INSTRUCTIONS Watch your condition for any changes. Taking the following actions may help to lessen any discomfort that you are feeling:  Avoid drinking alcohol.  Try acupuncture or massage to help relieve your symptoms.  Keep all follow-up visits as directed by your health care provider. This is important. SEEK MEDICAL CARE IF:  You continue to have episodes of paresthesia.  Your burning or prickling feeling gets worse when you walk.  You have pain, cramps, or dizziness.  You develop a rash. SEEK IMMEDIATE MEDICAL CARE IF:  You feel weak.  You have trouble walking or moving.  You have problems with  speech, understanding, or vision.  You feel confused.  You cannot control your bladder or bowel movements.  You have numbness after an injury.  You faint.   This information is not intended to replace advice given to you by your health care provider. Make sure you discuss any questions you have with your health care provider.   Document Released: 05/11/2002 Document Revised: 10/05/2014 Document Reviewed: 05/17/2014 Elsevier Interactive Patient Education 2016 [employer].   Emergency Department Resource Guide 1) Find a Doctor and Pay Out of Pocket Although you won't have to find out who is covered by your insurance plan, it is a good idea to ask around and get recommendations. You will then need to call the office and see if the doctor you have chosen will accept you as a new patient and what types of options they offer for patients who are self-pay. Some doctors offer discounts or will set up payment plans for their patients who do not have insurance, but you will need to ask so you aren't surprised when you get to your appointment.  2) Contact Your Local Health Department Not all health departments have doctors that can see patients for sick visits, but many do, so it is worth a call to see if yours does. If you don't know where your local health department is, you can check in your phone book. The CDC also has a tool to help you locate your state's health department, and many state websites also have listings of all of their local health departments.  3) Find a [facility] If your illness is not likely to be very severe or complicated, you may  want to try a walk in clinic. These are popping up all over the country in pharmacies, drugstores, and shopping centers. They're usually staffed by nurse practitioners or physician assistants that have been trained to treat common illnesses and complaints. They're usually fairly quick and inexpensive. However, if you have serious medical issues  or chronic medical problems, these are probably not your best option.  No Primary Care Doctor: - Call Health Connect at  [phone] - they can help you locate a primary care doctor that  accepts your insurance, provides certain services, etc. - Physician Referral Service- [phone]  Chronic Pain Problems: Organization         Address  Phone   Notes  [facility]  [phone] Patients need to be referred by their primary care doctor.   Medication Assistance: Organization         Address  Phone   Notes  [place] [facility] [address] [place], [place] [address] [phone] --Must be a resident of [facility] -- Must have NO insurance coverage whatsoever (no Medicaid/ Medicare, etc.) -- The pt. MUST have a primary care doctor that directs their care regularly and follows them in the community   MedAssist  [phone]   [employer]  [phone]    Agencies that provide inexpensive medical care: Organization         Address  Phone   Notes  [place]  [phone]   [facility]    [phone]   [facility] [place], [place] [address] [phone]   [place] [address] [place] [phone]   Planned Parenthood    [phone]   [facility]    [phone]   [place] and [address], [place] Phone:  [phone], Fax:  [phone] Hours of Operation:  9 am - 6 pm, M-F.  Also accepts Medicaid/Medicare and self-pay.  [facility] for [place] [address], [address], [place] Phone: [phone], Fax: [phone]. Hours of Operation:  8:30 am - 5:30 pm, M-F.  Also accepts Medicaid and self-pay.  [facility] [place] [address] [place] Phone: [phone]   [place], [place], [place]  [phone], Ext. [phone] Mondays & Thursdays: 7-9 AM.  First 15 patients are seen on a first come, first serve basis.    [place] Providers:  Organization         Address  Phone   Notes  [facility] [address], [place] [phone] Also accepts self-pay patients.  [facility] [address], [place]  [phone]   [place], [address], [place] [phone]   [facility] [address], [place] [phone]   Lucianne Lei [address], [place]   [phone] Only accepts [place] Access [place] patients after they have their name applied to their card.   Self-Pay (no insurance) in [facility]:  Organization         Address  Phone   Notes  Sickle Cell Patients, [facility] [place] [phone]   [facility] [place] [phone])  [facility] [place]  [place], [address], [place] [phone]   [facility]/Dr. Osei-Bonsu  [address], [place] or [address], [place] [phone] Phone number for both [place] and [place] locations is the same.  Urgent Medical and [facility] [address] [place] [phone]   [facility] [address], [place] or [address] [phone]   [facility] [address] [place] [phone], phone; [phone], fax Sees patients 1st and 3rd Saturday of every month.  Must not qualify for public or private insurance (i.e. Medicaid, Medicare, [place] Health Choice, Veterans' Benefits)  Household income should be no more than 200% of the poverty level The clinic cannot treat you if you are pregnant or think you are pregnant  Sexually transmitted  diseases are not treated at the clinic.    Dental Care: Organization         Address  Phone  Notes  [facility] Department of [facility] [phone] Accepts children up to age 24 who are enrolled in [place] or [place]; pregnant women with a Medicaid card; and children who have applied for Medicaid or [place] Health Choice, but were declined, whose parents can pay a reduced fee at time of service.  [facility] Department of [facility]  [address], [place] [phone] Accepts children up to age 37 who are enrolled in [place] or [place]; pregnant women with a Medicaid card; and children who have applied for Medicaid or [place] Health Choice, but were declined, whose parents can pay a reduced fee at time of service.  [place] Adult Dental Access PROGRAM  [place] [phone] Patients are seen by appointment only. Walk-ins are not accepted. [place] will see patients 25 years of age and older. Monday - Tuesday (8am-5pm) Most Wednesdays (8:30-5pm) $30 per visit, cash only  [facility] Adult Dental Access PROGRAM  [address], [facility] [phone] Patients are seen by appointment only. Walk-ins are not accepted. [place] will see patients 2 years of age and older. One Wednesday Evening (Monthly: Volunteer Based).  $30 per visit, cash only  [place]  [phone] for adults; Children under age 64, call Graduate Pediatric Dentistry at [phone]. Children aged 80-14, please call [phone] to request a pediatric application.  Dental services are provided in all areas of dental care including fillings, crowns and bridges, complete and partial dentures, implants, gum treatment, root canals, and extractions. Preventive care is also provided. Treatment is provided to both adults and children. Patients are selected via a  lottery and there is often a waiting list.   [facility] [place] [address] [place]  [phone] [email or web address]   [facility] [address] [place], [place] [phone], Ext. [phone] Second and Fourth Thursday of each month, opens at 6:30 AM; Clinic ends at 9 AM.  Patients are seen on a first-come first-served basis, and a limited number are seen during each clinic.   [facility]  [address] Hillard Danker [place], [place] [phone]   Eligibility Requirements You must have lived in [place], [place], or [place] counties for at least the last three months.   You cannot be eligible for state  or Museum/gallery conservator, including [employer], [place], or [employer].   You generally cannot be eligible for healthcare insurance through your employer.    How to apply: Eligibility screenings are held every Tuesday and Wednesday afternoon from 1:00 pm until 4:00 pm. You do not need an appointment for the interview!  [facility] [address] [employer], [place]   [place]  [place] Department  [place]  [phone]    Behavioral Health Resources in the Community: Intensive Outpatient Programs Organization         Address  Phone  Notes  [facility]. [address] [place], [place] [phone]   [facility] Outpatient [address], [place], [place]   ADS: Alcohol & Drug Svcs [address] [place], [place]   [place] [address],  [place], [place] or [phone]   Substance Abuse Resources Organization         Address  Phone  Notes  Alcohol and Drug Services  [phone]   [place]  [phone]   [place]   Chinita Pester  [phone]   Residential &  Outpatient Substance Abuse Program  [phone]   Psychological Services Organization         Address  Phone  Notes  [facility] [place]  [place]  [phone]   [place] [address], [place] or [phone]    Mobile Crisis Teams Organization         Address  Phone  Notes  [facility], Mobile Crisis Care Unit  [phone]   [facility]  [address] [place], [place]   [place] [address] [place] [phone]    Self-Help/Support Groups Organization         Address  Phone             Notes  [place]. of [place] - variety of support groups  [place] Call for more information  Narcotics Anonymous (NA), [facility] [address], [employer] [place]  2 meetings at this location   Special educational needs teacher         Address  Phone  Notes  [facility]idential Treatment [place],    [place]  [phone]   [facility]  [address] [place] [phone], [place], [place]   [place], [place] [phone] Admissions: 8am-3pm M-F  Incentives Substance [place] [address]    [place], [place] [phone]   [facility] [address] Jadene Pierini [place], [place]   [facility] [address]  [place], [place]   [facility] Programs - Intensive Outpatient [address], [place], [place]   [facility] ([place].) [address]  [place], [place] [phone] or [phone]   [facility] (RTS) [address] [place], [place] Accepts Medicaid  [facility] [place] [address]  [place] [phone] Substance Abuse/Addiction Treatment   [facility] Organization          Address  Phone  Notes  [facility]  [phone]   Domenic Schwab, PhD [place], [address] [place], [place]   [phone] or [phone]) [phone]   [facility]   [address] [place], [place] [phone]   [employer] [address], [place], [place] [phone] Insurance/Medicaid/sponsorship through [facility] and Families [address]                                    [place], [place] [phone] [place] [address]   [place], [place] [phone]    Dr. Adele Schilder  [phone]   [facility] Dept. 1) [address], [place] 2) [place] 3)  [place] [address], [place] [phone]  [phone]   [place] [phone] or [phone] (After Hours)

## 2015-06-03 ENCOUNTER — Ambulatory Visit (HOSPITAL_BASED_OUTPATIENT_CLINIC_OR_DEPARTMENT_OTHER): Payer: BC Managed Care – PPO | Admitting: Oncology

## 2015-06-03 ENCOUNTER — Telehealth: Payer: Self-pay | Admitting: Oncology

## 2015-06-03 ENCOUNTER — Other Ambulatory Visit (HOSPITAL_BASED_OUTPATIENT_CLINIC_OR_DEPARTMENT_OTHER): Payer: BC Managed Care – PPO

## 2015-06-03 VITALS — BP 130/83 | HR 87 | Temp 98.2°F | Resp 18 | Ht 64.0 in | Wt 202.1 lb

## 2015-06-03 DIAGNOSIS — D500 Iron deficiency anemia secondary to blood loss (chronic): Secondary | ICD-10-CM

## 2015-06-03 DIAGNOSIS — N920 Excessive and frequent menstruation with regular cycle: Secondary | ICD-10-CM | POA: Diagnosis not present

## 2015-06-03 DIAGNOSIS — D508 Other iron deficiency anemias: Secondary | ICD-10-CM

## 2015-06-03 DIAGNOSIS — D241 Benign neoplasm of right breast: Secondary | ICD-10-CM | POA: Diagnosis not present

## 2015-06-03 DIAGNOSIS — R109 Unspecified abdominal pain: Secondary | ICD-10-CM

## 2015-06-03 LAB — COMPREHENSIVE METABOLIC PANEL
ALBUMIN: 4 g/dL (ref 3.5–5.0)
ALT: 21 U/L (ref 0–55)
AST: 21 U/L (ref 5–34)
Alkaline Phosphatase: 82 U/L (ref 40–150)
Anion Gap: 9 mEq/L (ref 3–11)
BILIRUBIN TOTAL: 0.4 mg/dL (ref 0.20–1.20)
BUN: 12.6 mg/dL (ref 7.0–26.0)
CHLORIDE: 105 meq/L (ref 98–109)
CO2: 27 meq/L (ref 22–29)
Calcium: 9.2 mg/dL (ref 8.4–10.4)
Creatinine: 0.9 mg/dL (ref 0.6–1.1)
EGFR: 87 mL/min/{1.73_m2} — ABNORMAL LOW (ref >90)
GLUCOSE: 86 mg/dL (ref 70–140)
POTASSIUM: 4.1 meq/L (ref 3.5–5.1)
SODIUM: 140 meq/L (ref 136–145)
TOTAL PROTEIN: 8.2 g/dL (ref 6.4–8.3)

## 2015-06-03 LAB — CBC WITH DIFFERENTIAL/PLATELET
BASO%: 0.5 % (ref 0.0–2.0)
Basophils Absolute: 0 10*3/uL (ref 0.0–0.1)
EOS%: 1.3 % (ref 0.0–7.0)
Eosinophils Absolute: 0.1 10*3/uL (ref 0.0–0.5)
HCT: 40.2 % (ref 34.8–46.6)
HGB: 13.1 g/dL (ref 11.6–15.9)
LYMPH#: 2.1 10*3/uL (ref 0.9–3.3)
LYMPH%: 44.8 % (ref 14.0–49.7)
MCH: 26.8 pg (ref 25.1–34.0)
MCHC: 32.5 g/dL (ref 31.5–36.0)
MCV: 82.5 fL (ref 79.5–101.0)
MONO#: 0.4 10*3/uL (ref 0.1–0.9)
MONO%: 7.9 % (ref 0.0–14.0)
NEUT#: 2.1 10*3/uL (ref 1.5–6.5)
NEUT%: 45.5 % (ref 38.4–76.8)
Platelets: 232 10*3/uL (ref 145–400)
RBC: 4.87 10*6/uL (ref 3.70–5.45)
RDW: 13 % (ref 11.2–14.5)
WBC: 4.6 10*3/uL (ref 3.9–10.3)

## 2015-06-03 LAB — IRON AND TIBC
%SAT: 26 % (ref 21–57)
IRON: 82 ug/dL (ref 41–142)
TIBC: 318 ug/dL (ref 236–444)
UIBC: 236 ug/dL (ref 120–384)

## 2015-06-03 LAB — FERRITIN: Ferritin: 899 ng/ml — ABNORMAL HIGH (ref 9–269)

## 2015-06-03 NOTE — Progress Notes (Signed)
[place] NOTE   [name], MD [place] [address]  DIAGNOSIS: 49 year old woman with iron deficiency anemia dating back to 2012. Anemia related to chronic blood loss and menorrhagia.  Prior therapy: The patient has received Feraheme on multiple occasions the most recent of which was in October 2015.   CURRENT THERAPY: Observation and surveillance. IV iron as needed.     INTERVAL HISTORY:  [name] presents today for a follow-up visit. Since the last visit, she reports no recent changes in her health. 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  She has not reported any bleeding complications since the last visit including hematochezia, melena, epistaxis or hematuria. She continues to work full time and very active. She has not reported any decline in her energy her performance status. Has not reported any ice cravings.   She does not report any fevers chills or sweats. She does not report any headaches him a syncope or seizures. She does not report any chest pain or palpitation. She does not report any nausea, vomiting, abdominal pain. She does not report any frequency urgency or hesitancy. She does not report any skeletal complaints. Remaining review of systems unremarkable.  MEDICAL HISTORY: Past Medical History  Diagnosis Date  . Hyperlipidemia   . HYPOTHYROIDISM 01/30/2007  . 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/2007  . ASTHMA 01/30/2007  . GERD 01/31/2007  . Hiatal hernia   . [place] tear 04/2003  . Hemorrhoids   . HTN (hypertension) 09/09/2011  . Breast tumor     right    I ALLERGIES:  is allergic to tuna; codeine; and flonase.  MEDICATIONS:   Current Outpatient Prescriptions  Medication Sig Dispense Refill  . acetaminophen (TYLENOL) 325 MG tablet Take 325 mg by mouth every 6 (six) hours as needed for moderate pain.    [name] amLODipine (NORVASC) 10 MG  tablet TAKE ONE TABLET BY MOUTH ONCE DAILY 90 tablet 3  . aspirin 325 MG tablet Take 325 mg by mouth daily.    [name] atorvastatin (LIPITOR) 20 MG tablet Take 1 tablet (20 mg total) by mouth daily. 90 tablet 1  . Cholecalciferol (VITAMIN D3) 5000 UNITS CAPS Take 1 capsule by mouth 2 (two) times a week.    . Cyanocobalamin (VITAMIN B 12 PO) Take 1 tablet by mouth daily.    [name] EPINEPHrine 0.3 mg/0.3 mL IJ SOAJ injection Inject 0.3 mLs (0.3 mg total) into the muscle once. 1 Device 0  . hydrochlorothiazide (MICROZIDE) 12.5 MG capsule Take 1 capsule (12.5 mg total) by mouth daily. As needed for swelling 90 capsule 3  . HYDROcodone-acetaminophen (NORCO/VICODIN) 5-325 MG tablet Take 1 tablet by mouth as directed.    . hydroxypropyl methylcellulose / hypromellose (ISOPTO TEARS / GONIOVISC) 2.5 % ophthalmic solution Place 1 drop into both eyes at bedtime.    . lansoprazole (PREVACID) 30 MG capsule Take 1 capsule (30 mg total) by mouth 2 (two) times daily before a meal. 60 capsule 11  . levothyroxine (SYNTHROID, LEVOTHROID) 125 MCG tablet TAKE ONE TABLET BY MOUTH ONCE DAILY BEFORE BREAKFAST 30 tablet 0  . loratadine (CLARITIN) 10 MG tablet Take 10 mg by mouth daily as needed for allergies.    . Multiple Vitamin (MULTIVITAMIN WITH MINERALS) TABS tablet Take 1 tablet by mouth daily.    . VENTOLIN HFA 108 (90 BASE) MCG/ACT inhaler INHALE TWO PUFFS BY MOUTH EVERY 4 HOURS  AS NEEDED FOR WHEEZING OR SHORTNESS OF BREATH 18 each 0   No current facility-administered medications for this visit.     PHYSICAL EXAMINATION: ECOG PERFORMANCE STATUS: 0  Blood pressure 130/83, pulse 87, temperature 98.2 F (36.8 C), temperature source Oral, resp. rate 18, height 5\' 4"  (1.626 m), weight 202 lb 1.6 oz (91.672 kg), last menstrual period 02/21/2013, SpO2 100 %.  GENERAL:alert, awake woman without distress. SKIN:  no rashes or significant lesions EYES:  sclera clear OROPHARYNX:No oral thrush. NECK: supple, thyroid normal  size, non-tender, without nodularity LYMPH:  no palpable lymphadenopathy in the cervical, axillary or supraclavicular LUNGS: clear to auscultation without rhonchi or dullness to percussion. HEART: regular rate & rhythm and no murmurs and no lower extremity edema ABDOMEN:abdomen soft, non-tender and normal bowel sounds no shifting dullness or ascites. Musculoskeletal:no cyanosis of digits and no clubbing  NEURO: alert & oriented x 3 with fluent speech, no focal motor/sensory deficits  CBC    Component Value Date/Time   WBC 4.6 06/03/2015 1416   WBC 6.0 03/16/2015 2225   WBC 4.9 07/12/2013 1113   RBC 4.87 06/03/2015 1416   RBC 4.76 03/16/2015 2225   RBC 5.03 07/12/2013 1113   HGB 13.1 06/03/2015 1416   HGB 13.1 03/16/2015 2225   HGB 13.4 07/12/2013 1113   HCT 40.2 06/03/2015 1416   HCT 39.7 03/16/2015 2225   HCT 43.3 07/12/2013 1113   PLT 232 06/03/2015 1416   PLT 263 03/16/2015 2225   MCV 82.5 06/03/2015 1416   MCV 83.4 03/16/2015 2225   MCV 86.0 07/12/2013 1113   MCH 26.8 06/03/2015 1416   MCH 27.5 03/16/2015 2225   MCH 26.6* 07/12/2013 1113   MCHC 32.5 06/03/2015 1416   MCHC 33.0 03/16/2015 2225   MCHC 30.9* 07/12/2013 1113   RDW 13.0 06/03/2015 1416   RDW 12.6 03/16/2015 2225   LYMPHSABS 2.1 06/03/2015 1416   LYMPHSABS 2.9 09/21/2014 2051   MONOABS 0.4 06/03/2015 1416   MONOABS 0.4 09/21/2014 2051   EOSABS 0.1 06/03/2015 1416   EOSABS 0.1 09/21/2014 2051   BASOSABS 0.0 06/03/2015 1416   BASOSABS 0.0 09/21/2014 2051      Chemistry      Component Value Date/Time   NA 139 03/16/2015 2225   NA 141 04/01/2014 1415   K 3.6 03/16/2015 2225   K 3.6 04/01/2014 1415   CL 104 03/16/2015 2225   CL 104 08/12/2012 1329   CO2 28 03/16/2015 2225   CO2 27 04/01/2014 1415   BUN 19 03/16/2015 2225   BUN 10.8 04/01/2014 1415   CREATININE 0.96 03/16/2015 2225   CREATININE 0.9 04/01/2014 1415      Component Value Date/Time   CALCIUM 9.4 03/16/2015 2225   CALCIUM 8.2*  04/01/2014 1415   ALKPHOS 88 09/21/2014 2051   ALKPHOS 82 10/01/2013 1338   AST 27 09/21/2014 2051   AST 18 10/01/2013 1338   ALT 31 09/21/2014 2051   ALT 14 10/01/2013 1338   BILITOT 0.3 09/21/2014 2051   BILITOT 0.20 10/01/2013 1338       Assessment and plan:    49 year old woman with the following issues.   1. Iron deficiency anemia secondary to menstruation: Her hemoglobin is today and iron studies have been within normal range in August 2016. The plan is to continue with observation and surveillance and repeat iron studies in 6 months. She will receive IV iron in the future as needed.  2. Right-sided flank pain: Unclear etiology imaging studies  did not show any evidence to suggest malignancy. This continues to be problematic at times without any major changes.  3. 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 Status post surgical removal without any evidence of malignancy.  4. Follow-up: Will be in 7 months sooner if needed to.  [facility] MD 06/03/2015

## 2015-06-03 NOTE — Telephone Encounter (Signed)
GAVE PATIENT AVS REPORT AND APPOINTMENTS FOR July.  °

## 2015-06-15 ENCOUNTER — Other Ambulatory Visit: Payer: Self-pay | Admitting: Internal Medicine

## 2015-06-26 ENCOUNTER — Ambulatory Visit (INDEPENDENT_AMBULATORY_CARE_PROVIDER_SITE_OTHER): Payer: BC Managed Care – PPO | Admitting: Physician Assistant

## 2015-06-26 VITALS — BP 114/72 | HR 105 | Temp 98.2°F | Resp 18 | Ht 62.75 in | Wt 200.0 lb

## 2015-06-26 DIAGNOSIS — J988 Other specified respiratory disorders: Secondary | ICD-10-CM | POA: Diagnosis not present

## 2015-06-26 DIAGNOSIS — J040 Acute laryngitis: Secondary | ICD-10-CM

## 2015-06-26 DIAGNOSIS — R05 Cough: Secondary | ICD-10-CM

## 2015-06-26 DIAGNOSIS — J22 Unspecified acute lower respiratory infection: Secondary | ICD-10-CM

## 2015-06-26 DIAGNOSIS — R059 Cough, unspecified: Secondary | ICD-10-CM

## 2015-06-26 MED ORDER — AZITHROMYCIN 250 MG PO TABS: ORAL_TABLET | ORAL | Status: DC

## 2015-06-26 MED ORDER — GUAIFENESIN ER 1200 MG PO TB12: 1.0000 | ORAL_TABLET | Freq: Two times a day (BID) | ORAL | Status: DC | PRN

## 2015-06-26 MED ORDER — BENZONATATE 100 MG PO CAPS: 100.0000 mg | ORAL_CAPSULE | Freq: Three times a day (TID) | ORAL | Status: DC | PRN

## 2015-06-26 MED ORDER — ALBUTEROL SULFATE HFA 108 (90 BASE) MCG/ACT IN AERS: 2.0000 | INHALATION_SPRAY | RESPIRATORY_TRACT | Status: DC | PRN

## 2015-06-26 NOTE — Progress Notes (Signed)
[facility] [address], [place] [address] [phone]  Date:  06/26/2015   Name:  Marilyn [name]   DOB:  1965-06-10   MRN:  [id number]  PCP:  [name], MD   Chief Complaint  Patient presents with  . Cough    started Thursday  . Laryngitis    started Friday    History of Present Illness:  Marilyn [name] is a 50 y.o. female patient who presents to [facility] for hoarseness, coughing, and ear pain.   4 days ago, she developed nasal congestion and drainage to the back of her throat.  She then developed hoarseness.  She started feeling improvement today but has a general malaise.  She has some ear pressure, and claims to have drainage from both of her ears.   There is a productive cough of a dark red/green sputum very minimal. Took ibuprofen and benadryl.  Started to feel better, but the next day she felt malaise.  There is some chest tightness though denies dyspnea or sob.     Patient Active Problem List   Diagnosis Date Noted  . Pain in lower back 11/30/2014  . Acute upper respiratory infection 05/05/2014  . Allergic rhinitis 05/05/2014  . Hoarseness 05/05/2014  . Dysphagia 05/05/2014  . Obesity 04/11/2014  . Edema, peripheral 04/11/2014  . Cough 12/20/2012  . Hypersomnolence 12/15/2012  . DOE (dyspnea on exertion) 12/15/2012  . Right shoulder pain 12/15/2012  . Abdominal pain 11/15/2011  . HTN (hypertension) 09/09/2011  . Neck pain 09/09/2011  . Preventative health care 04/30/2011  . HOT FLASHES 12/22/2009  . CHEST PAIN 01/29/2009  . HYPERTHYROIDISM 01/31/2007  . GERD 01/31/2007  . HYPOTHYROIDISM 01/30/2007  . HYPERLIPIDEMIA 01/30/2007  . Iron deficiency anemia 01/30/2007  . ASTHMA 01/30/2007    Past Medical History  Diagnosis Date  . Hyperlipidemia   . HYPOTHYROIDISM 01/30/2007  . 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/2007  . ASTHMA 01/30/2007  . GERD 01/31/2007  . Hiatal hernia   . [place] tear 04/2003  . Hemorrhoids   . HTN  (hypertension) 09/09/2011  . Breast tumor     right    Past Surgical History  Procedure Laterality Date  . Tubal ligation  1987  . Broken jaw    . Breast reduction surgery  1986  . Carpal tunnel release  2001  . Thyroidectomy    . Breast lumpectomy with radioactive seed localization Right 02/25/2015    Procedure: BREAST LUMPECTOMY WITH RADIOACTIVE SEED LOCALIZATION;  Surgeon: Alphonsa Overall, MD;  Location: [facility];  Service: General;  Laterality: Right;    Social History  Substance Use Topics  . Smoking status: Former Smoker -- 0.25 packs/day for 3 years    Types: Cigarettes    Quit date: 06/04/1980  . Smokeless tobacco: Never Used  . Alcohol Use: No    Family History  Problem Relation Age of Onset  . Hypertension Father   . Diabetes Father   . Breast cancer Sister   . Colon cancer Neg Hx   . Diabetes Maternal Grandmother   . Heart disease Maternal Grandmother   . Asthma Mother   . Asthma Maternal Grandfather     Allergies  Allergen Reactions  . Tuna [Fish Allergy] Anaphylaxis  . Codeine Nausea And Vomiting  . Flonase [Fluticasone Propionate] Other (See Comments)    Nose bleeds    Medication list has been reviewed and updated.  Current Outpatient Prescriptions on File Prior to Visit  Medication Sig Dispense Refill  . acetaminophen (TYLENOL) 325 MG tablet Take 325 mg by mouth every 6 (six) hours as needed for moderate pain.    [name] amLODipine (NORVASC) 10 MG tablet TAKE ONE TABLET BY MOUTH ONCE DAILY 90 tablet 3  . aspirin 325 MG tablet Take 325 mg by mouth daily.    [name] atorvastatin (LIPITOR) 20 MG tablet Take 1 tablet (20 mg total) by mouth daily. 90 tablet 1  . Cholecalciferol (VITAMIN D3) 5000 UNITS CAPS Take 1 capsule by mouth 2 (two) times a week.    . Cyanocobalamin (VITAMIN B 12 PO) Take 1 tablet by mouth daily.    [name] EPINEPHrine 0.3 mg/0.3 mL IJ SOAJ injection Inject 0.3 mLs (0.3 mg total) into the muscle once. 1 Device 0  . hydrochlorothiazide  (MICROZIDE) 12.5 MG capsule Take 1 capsule (12.5 mg total) by mouth daily. As needed for swelling 90 capsule 3  . HYDROcodone-acetaminophen (NORCO/VICODIN) 5-325 MG tablet Take 1 tablet by mouth as directed.    . hydroxypropyl methylcellulose / hypromellose (ISOPTO TEARS / GONIOVISC) 2.5 % ophthalmic solution Place 1 drop into both eyes at bedtime.    . lansoprazole (PREVACID) 30 MG capsule Take 1 capsule (30 mg total) by mouth 2 (two) times daily before a meal. 60 capsule 11  . levothyroxine (SYNTHROID, LEVOTHROID) 125 MCG tablet TAKE ONE TABLET BY MOUTH ONCE DAILY BEFORE BREAKFAST. YEARLY PHYSICAL WITH LABS ARE DUE MUST SEE MD FOR REFILLS 30 tablet 1  . loratadine (CLARITIN) 10 MG tablet Take 10 mg by mouth daily as needed for allergies.    . Multiple Vitamin (MULTIVITAMIN WITH MINERALS) TABS tablet Take 1 tablet by mouth daily.    . VENTOLIN HFA 108 (90 BASE) MCG/ACT inhaler INHALE TWO PUFFS BY MOUTH EVERY 4 HOURS AS NEEDED FOR WHEEZING OR SHORTNESS OF BREATH 18 each 0   No current facility-administered medications on file prior to visit.    ROS ROS otherwise unremarkable unless listed above.   Physical Examination: BP 114/72 mmHg  Pulse 105  Temp(Src) 98.2 F (36.8 C) (Oral)  Resp 18  Ht 5' 2.75" (1.594 m)  Wt 200 lb (90.719 kg)  BMI 35.70 kg/m2  SpO2 98%  LMP 02/21/2013 Ideal Body Weight: Weight in (lb) to have BMI = 25: 139.7  Physical Exam  Constitutional: She is oriented to person, place, and time. She appears well-developed and well-nourished. No distress.  HENT:  Head: Normocephalic and atraumatic.  Right Ear: External ear normal.  Left Ear: External ear normal.  Nose: Mucosal edema and rhinorrhea present. Right sinus exhibits maxillary sinus tenderness. Left sinus exhibits maxillary sinus tenderness.  Mouth/Throat: Posterior oropharyngeal erythema (mild) present. No oropharyngeal exudate or posterior oropharyngeal edema.  Eyes: Conjunctivae and EOM are normal. Pupils  are equal, round, and reactive to light.  Cardiovascular: Normal rate, regular rhythm and normal heart sounds.  Exam reveals no friction rub.   No murmur heard. Pulmonary/Chest: Effort normal. No respiratory distress. She has no wheezes.  Lymphadenopathy:       Head (right side): No tonsillar, no preauricular and no posterior auricular adenopathy present.       Head (left side): No tonsillar, no preauricular and no posterior auricular adenopathy present.    She has no cervical adenopathy.  Neurological: She is alert and oriented to person, place, and time.  Skin: She is not diaphoretic.  Psychiatric: She has a normal mood and affect. Her behavior is normal.     Assessment and Plan: Mieke Mauss  Anastasio is a 50 y.o. female who is here today for cc of hoarseness, sore throat, congestion, and cough.  Refilling respiclick. Starting zpak. Advised heavy hydration, vocal rest, and humidifier.   Lower respiratory infection - Plan: albuterol (PROVENTIL HFA;VENTOLIN HFA) 108 (90 Base) MCG/ACT inhaler, azithromycin (ZITHROMAX) 250 MG tablet, benzonatate (TESSALON) 100 MG capsule, Guaifenesin (MUCINEX MAXIMUM STRENGTH) 1200 MG TB12  Laryngitis - Plan: albuterol (PROVENTIL HFA;VENTOLIN HFA) 108 (90 Base) MCG/ACT inhaler, azithromycin (ZITHROMAX) 250 MG tablet, benzonatate (TESSALON) 100 MG capsule, Guaifenesin (MUCINEX MAXIMUM STRENGTH) 1200 MG TB12  Cough - Plan: benzonatate (TESSALON) 100 MG capsule, Guaifenesin (MUCINEX MAXIMUM STRENGTH) 1200 MG TB12  Ivar Drape, PA-C [facility] 06/26/2015 4:14 PM

## 2015-06-26 NOTE — Patient Instructions (Addendum)
64 oz of water per day which is about 4 regular sized water bottles.   I would like you to continue to take the claritin.     I would like you to take the medication prescribed.  And please get your albuterol.  It is ok to take this every 4 hours for the first 24 hours of this illness. Get plenty of rest.

## 2015-06-27 ENCOUNTER — Telehealth: Payer: Self-pay

## 2015-06-27 NOTE — Telephone Encounter (Signed)
Contacted pharmacy, who also stated that she had given them an expired respiclick.  i contacted them and advised that it was fine to fill, or at least the smaller one at this time.  But if we do have a way of getting one of the coupons that are not expired.  The website says it does not have any at this time. If you can find one easily, please send to pharmacy.

## 2015-06-27 NOTE — Telephone Encounter (Signed)
Marilyn [name], I got a fax from [facility] asking if they can change pt's Rx from [place] to the [facility]? I wasn't sure if this changed the properties of delivery and wanted to OK it with you first. Pt has a coupon for the [facility].

## 2015-06-28 NOTE — Telephone Encounter (Signed)
I printed a coupon from [employer] and faxed to [facility] to use for pt.

## 2015-06-30 ENCOUNTER — Telehealth: Payer: Self-pay

## 2015-06-30 NOTE — Telephone Encounter (Signed)
Pt is needing to talk with stephanie only-she has no voice and is just asking for a call back   [phone] best number

## 2015-07-01 NOTE — Telephone Encounter (Signed)
Left message for patient to call back.  Asked that she give the reason to why she is calling, so that I can help her.  I will attempt later in the evening.

## 2015-07-03 ENCOUNTER — Encounter: Payer: Self-pay | Admitting: *Deleted

## 2015-07-03 NOTE — Telephone Encounter (Signed)
Pt wanted her note extended.  She was written out for one day on originally note, but could not go back because she had no voice and diarrhea.  She is better now and can return on Monday 1/30.  Note written and printed.  Pt notifed that it will be ready for pickup

## 2015-07-15 ENCOUNTER — Other Ambulatory Visit (INDEPENDENT_AMBULATORY_CARE_PROVIDER_SITE_OTHER): Payer: BC Managed Care – PPO

## 2015-07-15 ENCOUNTER — Encounter: Payer: Self-pay | Admitting: Internal Medicine

## 2015-07-15 ENCOUNTER — Ambulatory Visit (INDEPENDENT_AMBULATORY_CARE_PROVIDER_SITE_OTHER)
Admission: RE | Admit: 2015-07-15 | Discharge: 2015-07-15 | Disposition: A | Discharge status: Home / Self Care | Payer: BC Managed Care – PPO | Source: Ambulatory Visit | Attending: Internal Medicine | Admitting: Internal Medicine

## 2015-07-15 ENCOUNTER — Ambulatory Visit (INDEPENDENT_AMBULATORY_CARE_PROVIDER_SITE_OTHER): Payer: BC Managed Care – PPO | Admitting: Internal Medicine

## 2015-07-15 VITALS — BP 140/98 | HR 95 | Temp 98.7°F | Ht 62.75 in | Wt 200.2 lb

## 2015-07-15 DIAGNOSIS — R1031 Right lower quadrant pain: Secondary | ICD-10-CM

## 2015-07-15 DIAGNOSIS — Z0000 Encounter for general adult medical examination without abnormal findings: Secondary | ICD-10-CM

## 2015-07-15 DIAGNOSIS — R7989 Other specified abnormal findings of blood chemistry: Secondary | ICD-10-CM

## 2015-07-15 DIAGNOSIS — I10 Essential (primary) hypertension: Secondary | ICD-10-CM

## 2015-07-15 DIAGNOSIS — K219 Gastro-esophageal reflux disease without esophagitis: Secondary | ICD-10-CM | POA: Diagnosis not present

## 2015-07-15 LAB — BASIC METABOLIC PANEL
BUN: 12 mg/dL (ref 6–23)
CALCIUM: 9 mg/dL (ref 8.4–10.5)
CO2: 28 mEq/L (ref 19–32)
Chloride: 101 mEq/L (ref 96–112)
Creatinine, Ser: 0.78 mg/dL (ref 0.40–1.20)
GFR: 99.89 mL/min (ref >60.00)
GLUCOSE: 93 mg/dL (ref 70–99)
Potassium: 3.6 mEq/L (ref 3.5–5.1)
SODIUM: 137 meq/L (ref 135–145)

## 2015-07-15 LAB — CBC WITH DIFFERENTIAL/PLATELET
BASOS ABS: 0 10*3/uL (ref 0.0–0.1)
BASOS PCT: 0 % (ref 0–1)
EOS ABS: 0.1 10*3/uL (ref 0.0–0.7)
EOS PCT: 1 % (ref 0–5)
HCT: 37.2 % (ref 36.0–46.0)
Hemoglobin: 12.6 g/dL (ref 12.0–15.0)
Lymphocytes Relative: 42 % (ref 12–46)
Lymphs Abs: 2.2 10*3/uL (ref 0.7–4.0)
MCH: 27.5 pg (ref 26.0–34.0)
MCHC: 33.9 g/dL (ref 30.0–36.0)
MCV: 81 fL (ref 78.0–100.0)
MPV: 9.5 fL (ref 8.6–12.4)
Monocytes Absolute: 0.4 10*3/uL (ref 0.1–1.0)
Monocytes Relative: 7 % (ref 3–12)
Neutro Abs: 2.7 10*3/uL (ref 1.7–7.7)
Neutrophils Relative %: 50 % (ref 43–77)
PLATELETS: 262 10*3/uL (ref 150–400)
RBC: 4.59 MIL/uL (ref 3.87–5.11)
RDW: 14.1 % (ref 11.5–15.5)
WBC: 5.3 10*3/uL (ref 4.0–10.5)

## 2015-07-15 LAB — URINALYSIS, ROUTINE W REFLEX MICROSCOPIC
BILIRUBIN URINE: NEGATIVE
Hgb urine dipstick: NEGATIVE
KETONES UR: NEGATIVE
Leukocytes, UA: NEGATIVE
Nitrite: NEGATIVE
PH: 6.5 (ref 5.0–8.0)
RBC / HPF: NONE SEEN (ref 0–?)
SPECIFIC GRAVITY, URINE: 1.015 (ref 1.000–1.030)
Total Protein, Urine: NEGATIVE
UROBILINOGEN UA: 0.2 (ref 0.0–1.0)
Urine Glucose: NEGATIVE
WBC UA: NONE SEEN (ref 0–?)

## 2015-07-15 LAB — LIPASE: LIPASE: 72 U/L — AB (ref 11.0–59.0)

## 2015-07-15 MED ORDER — KETOROLAC TROMETHAMINE 30 MG/ML IM SOLN
30.0000 mg | Freq: Once | INTRAMUSCULAR | Status: DC
Start: 2015-07-15 — End: 2015-07-15

## 2015-07-15 MED ORDER — HYDROCODONE-ACETAMINOPHEN 7.5-325 MG PO TABS: 1.0000 | ORAL_TABLET | Freq: Four times a day (QID) | ORAL | Status: DC | PRN

## 2015-07-15 MED ORDER — IOHEXOL 300 MG/ML  SOLN
100.0000 mL | Freq: Once | INTRAMUSCULAR | Status: AC | PRN
  Administered 2015-07-15: 100 mL via INTRAVENOUS

## 2015-07-15 MED ORDER — KETOROLAC TROMETHAMINE 30 MG/ML IM SOLN
30.0000 mg | Freq: Once | INTRAMUSCULAR | Status: AC
  Administered 2015-07-15: 30 mg via INTRAMUSCULAR

## 2015-07-15 NOTE — Progress Notes (Signed)
Subjective:    Patient ID: Marilyn [name], female    DOB: 09-Mar-1966, 50 y.o.   MRN: [id number]  HPI  Here for wellness and f/u;  Overall doing ok;  Pt denies Chest pain, worsening SOB, DOE, wheezing, orthopnea, PND, worsening LE edema, palpitations, dizziness or syncope.  Pt denies neurological change such as new headache, facial or extremity weakness.  Pt denies polydipsia, polyuria, or low sugar symptoms. Pt states overall good compliance with treatment and medications, good tolerability, and has been trying to follow appropriate diet.  Pt denies worsening depressive symptoms, suicidal ideation or panic. No fever, night sweats, wt loss, loss of appetite, or other constitutional symptoms.  Pt states good ability with ADL's, has low fall risk, home safety reviewed and adequate, no other significant changes in hearing or vision, and only occasionally active with exercise.  Here also with RLQ pain x 4-5 days, started with watching TV, gradually worsening over this last few days now mod to severe, wanted to wait for her appt today/did not seek other med attention; worse in most positions but more worse with moveement such as twisting, sitting up, or right hip flexion, seems to radiate to the right lower back but pain seems to start RLQ and radiate some to the right anter thigh which feels swollen but doesn't actually see it.  Constant, was better initially with tylenol, but not today.  .Did have what sounds like viral illness with nausea, crampy abd pains and loose stools, but thought she had gotten throught that. Also has recurring constipation , but never pain like this. She is not sure about fever, denies n/v and Denies worsening reflux, abd pain, dysphagia, n/v, bowel change or blood other than above.  Denies urinary symptoms such as dysuria, frequency, urgency, flank pain, hematuria or n/v, fever, chills, but states "feels like someone is just grinding my right ovary" Pt trying to avoid ER, and states  she is able to drive Past Medical History  Diagnosis Date  . Hyperlipidemia   . HYPOTHYROIDISM 01/30/2007  . 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/2007  . ASTHMA 01/30/2007  . GERD 01/31/2007  . Hiatal hernia   . [place] tear 04/2003  . Hemorrhoids   . HTN (hypertension) 09/09/2011  . Breast tumor     right   Past Surgical History  Procedure Laterality Date  . Tubal ligation  1987  . Broken jaw    . Breast reduction surgery  1986  . Carpal tunnel release  2001  . Thyroidectomy    . Breast lumpectomy with radioactive seed localization Right 02/25/2015    Procedure: BREAST LUMPECTOMY WITH RADIOACTIVE SEED LOCALIZATION;  Surgeon: Alphonsa Overall, MD;  Location: [facility];  Service: General;  Laterality: Right;    reports that she quit smoking about 35 years ago. Her smoking use included Cigarettes. She has a .75 pack-year smoking history. She has never used smokeless tobacco. She reports that she does not drink alcohol or use illicit drugs. family history includes Asthma in her maternal grandfather and mother; Breast cancer in her sister; Diabetes in her father and maternal grandmother; Heart disease in her maternal grandmother; Hypertension in her father. There is no history of Colon cancer. Allergies  Allergen Reactions  . Tuna [Fish Allergy] Anaphylaxis  . Codeine Nausea And Vomiting  . Flonase [Fluticasone Propionate] Other (See Comments)    Nose bleeds   Current Outpatient Prescriptions on File Prior to Visit  Medication Sig Dispense Refill  . acetaminophen (TYLENOL) 325  MG tablet Take 325 mg by mouth every 6 (six) hours as needed for moderate pain.    [name] albuterol (PROVENTIL HFA;VENTOLIN HFA) 108 (90 Base) MCG/ACT inhaler Inhale 2 puffs into the lungs every 4 (four) hours as needed for wheezing or shortness of breath (cough, shortness of breath or wheezing.). 1 Inhaler 1  . amLODipine (NORVASC) 10 MG tablet TAKE ONE TABLET BY MOUTH ONCE DAILY 90 tablet 3  . aspirin 325  MG tablet Take 325 mg by mouth daily.    [name] atorvastatin (LIPITOR) 20 MG tablet Take 1 tablet (20 mg total) by mouth daily. 90 tablet 1  . azithromycin (ZITHROMAX) 250 MG tablet Take 2 tabs PO x 1 dose, then 1 tab PO QD x 4 days 6 tablet 0  . benzonatate (TESSALON) 100 MG capsule Take 1-2 capsules (100-200 mg total) by mouth 3 (three) times daily as needed for cough. 40 capsule 0  . Cholecalciferol (VITAMIN D3) 5000 UNITS CAPS Take 1 capsule by mouth 2 (two) times a week.    . Cyanocobalamin (VITAMIN B 12 PO) Take 1 tablet by mouth daily.    [name] EPINEPHrine 0.3 mg/0.3 mL IJ SOAJ injection Inject 0.3 mLs (0.3 mg total) into the muscle once. 1 Device 0  . Guaifenesin (MUCINEX MAXIMUM STRENGTH) 1200 MG TB12 Take 1 tablet (1,200 mg total) by mouth every 12 (twelve) hours as needed. 14 tablet 1  . hydrochlorothiazide (MICROZIDE) 12.5 MG capsule Take 1 capsule (12.5 mg total) by mouth daily. As needed for swelling 90 capsule 3  . HYDROcodone-acetaminophen (NORCO/VICODIN) 5-325 MG tablet Take 1 tablet by mouth as directed.    . hydroxypropyl methylcellulose / hypromellose (ISOPTO TEARS / GONIOVISC) 2.5 % ophthalmic solution Place 1 drop into both eyes at bedtime.    . lansoprazole (PREVACID) 30 MG capsule Take 1 capsule (30 mg total) by mouth 2 (two) times daily before a meal. 60 capsule 11  . levothyroxine (SYNTHROID, LEVOTHROID) 125 MCG tablet TAKE ONE TABLET BY MOUTH ONCE DAILY BEFORE BREAKFAST. YEARLY PHYSICAL WITH LABS ARE DUE MUST SEE MD FOR REFILLS 30 tablet 1  . loratadine (CLARITIN) 10 MG tablet Take 10 mg by mouth daily as needed for allergies.    . Multiple Vitamin (MULTIVITAMIN WITH MINERALS) TABS tablet Take 1 tablet by mouth daily.     No current facility-administered medications on file prior to visit.   Review of Systems Constitutional: Negative for increased diaphoresis, other activity, appetite or siginficant weight change other than noted HENT: Negative for worsening hearing loss, ear  pain, facial swelling, mouth sores and neck stiffness.   Eyes: Negative for other worsening pain, redness or visual disturbance.  Respiratory: Negative for shortness of breath and wheezing  Cardiovascular: Negative for chest pain and palpitations.  Gastrointestinal: Negative for diarrhea, blood in stool, abdominal distention or other pain except for the above Genitourinary: Negative for hematuria, flank pain or change in urine volume.  Musculoskeletal: Negative for myalgias or other joint complaints.  Skin: Negative for color change and wound or drainage.  Neurological: Negative for syncope and numbness. other than noted Hematological: Negative for adenopathy. or other swelling Psychiatric/Behavioral: Negative for hallucinations, SI, self-injury, decreased concentration or other worsening agitation.      Objective:   Physical Exam BP 140/98 mmHg  Pulse 95  Temp(Src) 98.7 F (37.1 C) (Oral)  Ht 5' 2.75" (1.594 m)  Wt 200 lb 4 oz (90.833 kg)  BMI 35.75 kg/m2  SpO2 94%  LMP 02/21/2013 VS noted, non toxic but  prefers to lie flat on exam table, appears in obvious pain with grimacing with movement, more to palpation as well Constitutional: Pt is oriented to person, place, and time. Appears well-developed and well-nourished, in no significant distress Head: Normocephalic and atraumatic.  Right Ear: External ear normal.  Left Ear: External ear normal.  Nose: Nose normal.  Mouth/Throat: Oropharynx is clear and moist.  Eyes: Conjunctivae and EOM are normal. Pupils are equal, round, and reactive to light.  Neck: Normal range of motion. Neck supple. No JVD present. No tracheal deviation present or significant neck LA or mass Cardiovascular: Normal rate, regular rhythm, normal heart sounds and intact distal pulses.   Pulmonary/Chest: Effort normal and breath sounds without rales or wheezing  Abdominal: Soft. Bowel sounds are normal. NT. No HSM  except for mod to severe tender RLQ without  guarding or rebound, nondistended Musculoskeletal: Normal range of motion. Exhibits no edema. though right hip flexion limited due to pain induced Lymphadenopathy:  Has no cervical adenopathy.  Neurological: Pt is alert and oriented to person, place, and time. Pt has normal reflexes. No cranial nerve deficit. Motor grossly intact Skin: Skin is warm and dry. No rash noted.  Psychiatric:  Has normal mood and affect. Behavior is normal.     Assessment & Plan:

## 2015-07-15 NOTE — Assessment & Plan Note (Signed)
Etiology unclear but has higher suspicion for GI related even appendix, though cant r/o other such as enteritis/colitis, constipation, UTI, right lower back radicular pain (doubt) or even right hip joint pathology; for stat labs today as ordered, also stat Abd/pelvis CT w/ CM, toradol IM now, hydrocodone 7.5s prn, further advice pending test results

## 2015-07-15 NOTE — Patient Instructions (Signed)
You had the pain shot in the office today (toradol)  Please take all new medication as prescribed  - the hydrocodone pain pills as needed  Please continue all other medications as before, and refills have been done if requested.  Please have the pharmacy call with any other refills you may need.  Please continue your efforts at being more active, low cholesterol diet, and weight control.  You are otherwise up to date with prevention measures today.  Please keep your appointments with your specialists as you may have planned  You will be contacted regarding the referral for: CT abdomen/pelvis - stat - to see PCC's now  Please go to the LAB in the Basement (turn left off the elevator) for the tests to be done today (right after seeing the [facility])  You will be contacted by phone if any changes need to be made immediately.  Otherwise, you will receive a letter about your results with an explanation, but please check with MyChart first.  Please remember to sign up for MyChart if you have not done so, as this will be important to you in the future with finding out test results, communicating by private email, and scheduling acute appointments online when needed.  Please return in 6 months, or sooner if needed

## 2015-07-15 NOTE — Assessment & Plan Note (Signed)

## 2015-07-15 NOTE — Assessment & Plan Note (Signed)
stable overall by history and exam, recent data reviewed with pt, and pt to continue medical treatment as before,  to f/u any worsening symptoms or concerns Lab Results  Component Value Date   WBC 4.6 06/03/2015   HGB 13.1 06/03/2015   HCT 40.2 06/03/2015   PLT 232 06/03/2015   GLUCOSE 86 06/03/2015   CHOL 254* 04/14/2014   TRIG 116.0 04/14/2014   HDL 38.50* 04/14/2014   LDLDIRECT 165.3 12/15/2012   LDLCALC 192* 04/14/2014   ALT 21 06/03/2015   AST 21 06/03/2015   NA 140 06/03/2015   K 4.1 06/03/2015   CL 104 03/16/2015   CREATININE 0.9 06/03/2015   BUN 12.6 06/03/2015   CO2 27 06/03/2015   TSH 7.09* 04/14/2014

## 2015-07-15 NOTE — Progress Notes (Signed)
Pre visit review using our clinic review tool, if applicable. No additional management support is needed unless otherwise documented below in the visit note. 

## 2015-07-15 NOTE — Assessment & Plan Note (Signed)
stable overall by history and exam, recent data reviewed with pt, and pt to continue medical treatment as before,  to f/u any worsening symptoms or concerns BP Readings from Last 3 Encounters:  07/15/15 140/98  06/26/15 114/72  06/03/15 130/83

## 2015-07-16 ENCOUNTER — Encounter (HOSPITAL_COMMUNITY): Payer: Self-pay | Admitting: *Deleted

## 2015-07-16 ENCOUNTER — Emergency Department (HOSPITAL_COMMUNITY): Payer: BC Managed Care – PPO

## 2015-07-16 ENCOUNTER — Inpatient Hospital Stay (HOSPITAL_COMMUNITY)
Admission: EM | Admit: 2015-07-16 | Discharge: 2015-07-22 | DRG: 761 | Disposition: A | Discharge status: Home / Self Care | Payer: BC Managed Care – PPO | Attending: Family Medicine | Admitting: Family Medicine

## 2015-07-16 DIAGNOSIS — Z79899 Other long term (current) drug therapy: Secondary | ICD-10-CM

## 2015-07-16 DIAGNOSIS — M47817 Spondylosis without myelopathy or radiculopathy, lumbosacral region: Secondary | ICD-10-CM | POA: Diagnosis present

## 2015-07-16 DIAGNOSIS — Z885 Allergy status to narcotic agent status: Secondary | ICD-10-CM

## 2015-07-16 DIAGNOSIS — E785 Hyperlipidemia, unspecified: Secondary | ICD-10-CM | POA: Diagnosis not present

## 2015-07-16 DIAGNOSIS — M545 Low back pain, unspecified: Secondary | ICD-10-CM

## 2015-07-16 DIAGNOSIS — R1031 Right lower quadrant pain: Secondary | ICD-10-CM

## 2015-07-16 DIAGNOSIS — J45909 Unspecified asthma, uncomplicated: Secondary | ICD-10-CM | POA: Diagnosis present

## 2015-07-16 DIAGNOSIS — Z7982 Long term (current) use of aspirin: Secondary | ICD-10-CM

## 2015-07-16 DIAGNOSIS — K210 Gastro-esophageal reflux disease with esophagitis: Secondary | ICD-10-CM | POA: Diagnosis present

## 2015-07-16 DIAGNOSIS — E039 Hypothyroidism, unspecified: Secondary | ICD-10-CM

## 2015-07-16 DIAGNOSIS — Z888 Allergy status to other drugs, medicaments and biological substances status: Secondary | ICD-10-CM

## 2015-07-16 DIAGNOSIS — K219 Gastro-esophageal reflux disease without esophagitis: Secondary | ICD-10-CM

## 2015-07-16 DIAGNOSIS — Z23 Encounter for immunization: Secondary | ICD-10-CM

## 2015-07-16 DIAGNOSIS — D251 Intramural leiomyoma of uterus: Secondary | ICD-10-CM | POA: Diagnosis not present

## 2015-07-16 DIAGNOSIS — E876 Hypokalemia: Secondary | ICD-10-CM | POA: Diagnosis present

## 2015-07-16 DIAGNOSIS — Z87891 Personal history of nicotine dependence: Secondary | ICD-10-CM

## 2015-07-16 DIAGNOSIS — M47816 Spondylosis without myelopathy or radiculopathy, lumbar region: Secondary | ICD-10-CM | POA: Diagnosis present

## 2015-07-16 DIAGNOSIS — I10 Essential (primary) hypertension: Secondary | ICD-10-CM | POA: Diagnosis not present

## 2015-07-16 DIAGNOSIS — K449 Diaphragmatic hernia without obstruction or gangrene: Secondary | ICD-10-CM | POA: Diagnosis present

## 2015-07-16 DIAGNOSIS — R52 Pain, unspecified: Secondary | ICD-10-CM

## 2015-07-16 DIAGNOSIS — R112 Nausea with vomiting, unspecified: Secondary | ICD-10-CM | POA: Diagnosis present

## 2015-07-16 DIAGNOSIS — T402X5A Adverse effect of other opioids, initial encounter: Secondary | ICD-10-CM | POA: Diagnosis not present

## 2015-07-16 DIAGNOSIS — E119 Type 2 diabetes mellitus without complications: Secondary | ICD-10-CM | POA: Diagnosis present

## 2015-07-16 DIAGNOSIS — R109 Unspecified abdominal pain: Secondary | ICD-10-CM

## 2015-07-16 DIAGNOSIS — Z91013 Allergy to seafood: Secondary | ICD-10-CM

## 2015-07-16 DIAGNOSIS — R1011 Right upper quadrant pain: Secondary | ICD-10-CM | POA: Diagnosis present

## 2015-07-16 DIAGNOSIS — Z825 Family history of asthma and other chronic lower respiratory diseases: Secondary | ICD-10-CM

## 2015-07-16 DIAGNOSIS — D509 Iron deficiency anemia, unspecified: Secondary | ICD-10-CM | POA: Diagnosis present

## 2015-07-16 LAB — CBC WITH DIFFERENTIAL/PLATELET
BASOS ABS: 0 10*3/uL (ref 0.0–0.1)
BASOS PCT: 0 %
Eosinophils Absolute: 0.1 10*3/uL (ref 0.0–0.7)
Eosinophils Relative: 1 %
HEMATOCRIT: 36.6 % (ref 36.0–46.0)
HEMOGLOBIN: 12 g/dL (ref 12.0–15.0)
Lymphocytes Relative: 43 %
Lymphs Abs: 2.1 10*3/uL (ref 0.7–4.0)
MCH: 26.6 pg (ref 26.0–34.0)
MCHC: 32.8 g/dL (ref 30.0–36.0)
MCV: 81.2 fL (ref 78.0–100.0)
MONOS PCT: 7 %
Monocytes Absolute: 0.3 10*3/uL (ref 0.1–1.0)
NEUTROS ABS: 2.4 10*3/uL (ref 1.7–7.7)
NEUTROS PCT: 49 %
Platelets: 231 10*3/uL (ref 150–400)
RBC: 4.51 MIL/uL (ref 3.87–5.11)
RDW: 13 % (ref 11.5–15.5)
WBC: 4.8 10*3/uL (ref 4.0–10.5)

## 2015-07-16 LAB — COMPREHENSIVE METABOLIC PANEL
ALBUMIN: 3.7 g/dL (ref 3.5–5.0)
ALK PHOS: 70 U/L (ref 38–126)
ALT: 19 U/L (ref 14–54)
AST: 22 U/L (ref 15–41)
Anion gap: 11 (ref 5–15)
BILIRUBIN TOTAL: 0.2 mg/dL — AB (ref 0.3–1.2)
BUN: 15 mg/dL (ref 6–20)
CHLORIDE: 103 mmol/L (ref 101–111)
CO2: 24 mmol/L (ref 22–32)
CREATININE: 0.79 mg/dL (ref 0.44–1.00)
Calcium: 8.7 mg/dL — ABNORMAL LOW (ref 8.9–10.3)
GFR calc Af Amer: >60 mL/min (ref >60)
Glucose, Bld: 134 mg/dL — ABNORMAL HIGH (ref 65–99)
Potassium: 3.4 mmol/L — ABNORMAL LOW (ref 3.5–5.1)
Sodium: 138 mmol/L (ref 135–145)
TOTAL PROTEIN: 7.5 g/dL (ref 6.5–8.1)

## 2015-07-16 LAB — CREATININE, SERUM
Creatinine, Ser: 0.73 mg/dL (ref 0.44–1.00)
GFR calc Af Amer: >60 mL/min (ref >60)

## 2015-07-16 LAB — URINE CULTURE

## 2015-07-16 LAB — SEDIMENTATION RATE: SED RATE: 37 mm/h — AB (ref 0–22)

## 2015-07-16 LAB — URINALYSIS, ROUTINE W REFLEX MICROSCOPIC
Bilirubin Urine: NEGATIVE
GLUCOSE, UA: NEGATIVE mg/dL
Ketones, ur: NEGATIVE mg/dL
Nitrite: NEGATIVE
PROTEIN: NEGATIVE mg/dL
Specific Gravity, Urine: 1.01 (ref 1.005–1.030)
pH: 7.5 (ref 5.0–8.0)

## 2015-07-16 LAB — LIPASE, BLOOD: LIPASE: 28 U/L (ref 11–51)

## 2015-07-16 LAB — C-REACTIVE PROTEIN

## 2015-07-16 LAB — I-STAT CG4 LACTIC ACID, ED: Lactic Acid, Venous: 1.66 mmol/L (ref 0.5–2.0)

## 2015-07-16 LAB — URINE MICROSCOPIC-ADD ON
Bacteria, UA: NONE SEEN
RBC / HPF: NONE SEEN RBC/hpf (ref 0–5)
WBC UA: NONE SEEN WBC/hpf (ref 0–5)

## 2015-07-16 LAB — POC URINE PREG, ED: Preg Test, Ur: NEGATIVE

## 2015-07-16 MED ORDER — SODIUM CHLORIDE 0.9 % IV SOLN
INTRAVENOUS | Status: DC
  Administered 2015-07-16 – 2015-07-17 (×3): via INTRAVENOUS

## 2015-07-16 MED ORDER — SODIUM CHLORIDE 0.9 % IV BOLUS (SEPSIS)
1000.0000 mL | Freq: Once | INTRAVENOUS | Status: AC
  Administered 2015-07-16: 1000 mL via INTRAVENOUS

## 2015-07-16 MED ORDER — HYPROMELLOSE (GONIOSCOPIC) 2.5 % OP SOLN: 1.0000 [drp] | Freq: Every day | OPHTHALMIC | Status: DC

## 2015-07-16 MED ORDER — ACETAMINOPHEN 325 MG PO TABS
650.0000 mg | ORAL_TABLET | Freq: Four times a day (QID) | ORAL | Status: DC | PRN
  Administered 2015-07-22: 650 mg via ORAL
  Filled 2015-07-16: qty 2

## 2015-07-16 MED ORDER — HYDROMORPHONE HCL 1 MG/ML IJ SOLN
0.5000 mg | INTRAMUSCULAR | Status: DC | PRN
  Administered 2015-07-16: 0.5 mg via INTRAVENOUS
  Administered 2015-07-16 – 2015-07-22 (×9): 1 mg via INTRAVENOUS
  Filled 2015-07-16 (×11): qty 1

## 2015-07-16 MED ORDER — ENOXAPARIN SODIUM 40 MG/0.4ML ~~LOC~~ SOLN
40.0000 mg | SUBCUTANEOUS | Status: DC
  Administered 2015-07-16 – 2015-07-21 (×5): 40 mg via SUBCUTANEOUS
  Filled 2015-07-16 (×5): qty 0.4

## 2015-07-16 MED ORDER — ONDANSETRON HCL 4 MG/2ML IJ SOLN: 4.0000 mg | Freq: Four times a day (QID) | INTRAMUSCULAR | Status: DC | PRN

## 2015-07-16 MED ORDER — ALUM & MAG HYDROXIDE-SIMETH 200-200-20 MG/5ML PO SUSP
30.0000 mL | Freq: Four times a day (QID) | ORAL | Status: DC | PRN
  Administered 2015-07-19: 30 mL via ORAL
  Filled 2015-07-16: qty 30

## 2015-07-16 MED ORDER — SENNA 8.6 MG PO TABS
1.0000 | ORAL_TABLET | Freq: Two times a day (BID) | ORAL | Status: DC
  Administered 2015-07-16 – 2015-07-22 (×13): 8.6 mg via ORAL
  Filled 2015-07-16 (×14): qty 1

## 2015-07-16 MED ORDER — LEVOTHYROXINE SODIUM 125 MCG PO TABS
125.0000 ug | ORAL_TABLET | Freq: Every day | ORAL | Status: DC
  Administered 2015-07-16 – 2015-07-20 (×5): 125 ug via ORAL
  Filled 2015-07-16 (×9): qty 1

## 2015-07-16 MED ORDER — ASPIRIN 325 MG PO TABS
325.0000 mg | ORAL_TABLET | Freq: Every day | ORAL | Status: DC
  Administered 2015-07-16 – 2015-07-22 (×7): 325 mg via ORAL
  Filled 2015-07-16 (×7): qty 1

## 2015-07-16 MED ORDER — HYDROCHLOROTHIAZIDE 12.5 MG PO CAPS
12.5000 mg | ORAL_CAPSULE | Freq: Every day | ORAL | Status: DC
  Administered 2015-07-16 – 2015-07-22 (×7): 12.5 mg via ORAL
  Filled 2015-07-16 (×7): qty 1

## 2015-07-16 MED ORDER — PANTOPRAZOLE SODIUM 20 MG PO TBEC
20.0000 mg | DELAYED_RELEASE_TABLET | Freq: Every day | ORAL | Status: DC
  Administered 2015-07-16 – 2015-07-21 (×6): 20 mg via ORAL
  Filled 2015-07-16 (×8): qty 1

## 2015-07-16 MED ORDER — ATORVASTATIN CALCIUM 20 MG PO TABS
20.0000 mg | ORAL_TABLET | Freq: Every day | ORAL | Status: DC
  Administered 2015-07-16 – 2015-07-22 (×7): 20 mg via ORAL
  Filled 2015-07-16 (×10): qty 1

## 2015-07-16 MED ORDER — ONDANSETRON HCL 4 MG PO TABS
4.0000 mg | ORAL_TABLET | Freq: Four times a day (QID) | ORAL | Status: DC | PRN
  Administered 2015-07-22: 4 mg via ORAL
  Filled 2015-07-16: qty 1

## 2015-07-16 MED ORDER — HYDROCODONE-ACETAMINOPHEN 5-325 MG PO TABS
1.0000 | ORAL_TABLET | ORAL | Status: DC | PRN
  Administered 2015-07-16 – 2015-07-20 (×8): 2 via ORAL
  Filled 2015-07-16 (×9): qty 2

## 2015-07-16 MED ORDER — ACETAMINOPHEN 650 MG RE SUPP: 650.0000 mg | Freq: Four times a day (QID) | RECTAL | Status: DC | PRN

## 2015-07-16 MED ORDER — POLYVINYL ALCOHOL 1.4 % OP SOLN
1.0000 [drp] | Freq: Every day | OPHTHALMIC | Status: DC
  Administered 2015-07-17 – 2015-07-21 (×5): 1 [drp] via OPHTHALMIC
  Filled 2015-07-16: qty 15

## 2015-07-16 MED ORDER — HYDROMORPHONE HCL 1 MG/ML IJ SOLN
2.0000 mg | Freq: Once | INTRAMUSCULAR | Status: AC
  Administered 2015-07-16: 2 mg via INTRAVENOUS
  Filled 2015-07-16: qty 2

## 2015-07-16 MED ORDER — KETOROLAC TROMETHAMINE 30 MG/ML IJ SOLN: 30.0000 mg | Freq: Once | INTRAMUSCULAR | Status: DC

## 2015-07-16 MED ORDER — HYDROMORPHONE HCL 1 MG/ML IJ SOLN
1.0000 mg | Freq: Once | INTRAMUSCULAR | Status: AC
  Administered 2015-07-16: 1 mg via INTRAVENOUS
  Filled 2015-07-16: qty 1

## 2015-07-16 MED ORDER — AMLODIPINE BESYLATE 10 MG PO TABS
10.0000 mg | ORAL_TABLET | Freq: Every day | ORAL | Status: DC
  Administered 2015-07-16 – 2015-07-22 (×7): 10 mg via ORAL
  Filled 2015-07-16 (×7): qty 1

## 2015-07-16 MED ORDER — DOCUSATE SODIUM 100 MG PO CAPS
100.0000 mg | ORAL_CAPSULE | Freq: Two times a day (BID) | ORAL | Status: DC
  Administered 2015-07-16 – 2015-07-22 (×13): 100 mg via ORAL
  Filled 2015-07-16 (×13): qty 1

## 2015-07-16 MED ORDER — MORPHINE SULFATE (PF) 4 MG/ML IV SOLN
6.0000 mg | Freq: Once | INTRAVENOUS | Status: DC
  Filled 2015-07-16: qty 2

## 2015-07-16 NOTE — ED Notes (Signed)
Report called to Dixie, RN on [place].

## 2015-07-16 NOTE — ED Provider Notes (Signed)
CSN: [id number]     Arrival date & time 07/16/15  0348 History   First MD Initiated Contact with Patient 07/16/15 0415     Chief Complaint  Patient presents with  . Abdominal Pain     (Consider location/radiation/quality/duration/timing/severity/associated sxs/prior Treatment) HPI  Ms. Rooker is a 50yo female, no sig PMH, here with significant RLQ abdominal pain for 1 week.  She saw her PCP who ordered blood work and CT of her abdomen to eval for appy.  She continues to have abdominal pain. She denies and N/V/D.  She denies having pain like this in the past.  There have been no fevers.  The pain radiates to her leg as well.  She denies back pain. There are no further complaints   10 Systems reviewed and are negative for acute change except as noted in the HPI.     Past Medical History  Diagnosis Date  . Hyperlipidemia   . HYPOTHYROIDISM 01/30/2007  . 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/2007  . ASTHMA 01/30/2007  . GERD 01/31/2007  . Hiatal hernia   . [place] tear 04/2003  . Hemorrhoids   . HTN (hypertension) 09/09/2011  . Breast tumor     right   Past Surgical History  Procedure Laterality Date  . Tubal ligation  1987  . Broken jaw    . Breast reduction surgery  1986  . Carpal tunnel release  2001  . Thyroidectomy    . Breast lumpectomy with radioactive seed localization Right 02/25/2015    Procedure: BREAST LUMPECTOMY WITH RADIOACTIVE SEED LOCALIZATION;  Surgeon: Alphonsa Overall, MD;  Location: [facility];  Service: General;  Laterality: Right;   Family History  Problem Relation Age of Onset  . Hypertension Father   . Diabetes Father   . Breast cancer Sister   . Colon cancer Neg Hx   . Diabetes Maternal Grandmother   . Heart disease Maternal Grandmother   . Asthma Mother   . Asthma Maternal Grandfather    Social History  Substance Use Topics  . Smoking status: Former Smoker -- 0.25 packs/day for 3 years    Types: Cigarettes    Quit date: 06/04/1980   . Smokeless tobacco: Never Used  . Alcohol Use: No   OB History    No data available     Review of Systems    Allergies  Tuna; Codeine; and Flonase  Home Medications   Prior to Admission medications   Medication Sig Start Date End Date Taking? Authorizing Provider  acetaminophen (TYLENOL) 325 MG tablet Take 325 mg by mouth every 6 (six) hours as needed for moderate pain.   Yes Historical Provider, MD  albuterol (PROVENTIL HFA;VENTOLIN HFA) 108 (90 Base) MCG/ACT inhaler Inhale 2 puffs into the lungs every 4 (four) hours as needed for wheezing or shortness of breath (cough, shortness of breath or wheezing.). 06/26/15  Yes Stephanie D English, PA  amLODipine (NORVASC) 10 MG tablet TAKE ONE TABLET BY MOUTH ONCE DAILY 07/20/14  Yes Biagio Borg, MD  aspirin 325 MG tablet Take 325 mg by mouth daily.   Yes Historical Provider, MD  atorvastatin (LIPITOR) 20 MG tablet Take 1 tablet (20 mg total) by mouth daily. 04/15/15 04/14/16 Yes Biagio Borg, MD  Cholecalciferol (VITAMIN D3) 5000 UNITS CAPS Take 1 capsule by mouth 2 (two) times a week.   Yes Historical Provider, MD  Cyanocobalamin (VITAMIN B 12 PO) Take 1 tablet by mouth daily.   Yes Historical Provider, MD  EPINEPHrine  0.3 mg/0.3 mL IJ SOAJ injection Inject 0.3 mLs (0.3 mg total) into the muscle once. 11/10/14  Yes Everlene Balls, MD  Guaifenesin (MUCINEX MAXIMUM STRENGTH) 1200 MG TB12 Take 1 tablet (1,200 mg total) by mouth every 12 (twelve) hours as needed. 06/26/15  Yes Stephanie D English, PA  hydrochlorothiazide (MICROZIDE) 12.5 MG capsule Take 1 capsule (12.5 mg total) by mouth daily. As needed for swelling 04/07/14  Yes Biagio Borg, MD  HYDROcodone-acetaminophen [facility]) 7.5-325 MG tablet Take 1 tablet by mouth every 6 (six) hours as needed for moderate pain. 07/15/15  Yes Biagio Borg, MD  hydroxypropyl methylcellulose / hypromellose (ISOPTO TEARS / GONIOVISC) 2.5 % ophthalmic solution Place 1 drop into both eyes at bedtime.   Yes Historical  Provider, MD  lansoprazole (PREVACID) 30 MG capsule Take 1 capsule (30 mg total) by mouth 2 (two) times daily before a meal. 02/22/15  Yes Ladene Artist, MD  levothyroxine (SYNTHROID, LEVOTHROID) 125 MCG tablet TAKE ONE TABLET BY MOUTH ONCE DAILY BEFORE BREAKFAST. YEARLY PHYSICAL WITH LABS ARE DUE MUST SEE MD FOR REFILLS 06/15/15  Yes Biagio Borg, MD  loratadine (CLARITIN) 10 MG tablet Take 10 mg by mouth daily as needed for allergies.   Yes Historical Provider, MD  Multiple Vitamin (MULTIVITAMIN WITH MINERALS) TABS tablet Take 1 tablet by mouth daily.   Yes Historical Provider, MD  azithromycin (ZITHROMAX) 250 MG tablet Take 2 tabs PO x 1 dose, then 1 tab PO QD x 4 days Patient not taking: Reported on 07/16/2015 06/26/15   Dorian Heckle English, PA  benzonatate (TESSALON) 100 MG capsule Take 1-2 capsules (100-200 mg total) by mouth 3 (three) times daily as needed for cough. Patient not taking: Reported on 07/16/2015 06/26/15   Colletta [place] D English, PA   BP 146/99 mmHg  Pulse 108  Temp(Src) 98.2 F (36.8 C)  Resp 28  Ht 5\' 2"  (1.575 m)  Wt 200 lb 4 oz (90.833 kg)  BMI 36.62 kg/m2  SpO2 98%  LMP 02/21/2013 Physical Exam  Constitutional: She is oriented to person, place, and time. She appears well-developed and well-nourished. She appears distressed.  tearful  HENT:  Head: Normocephalic and atraumatic.  Nose: Nose normal.  Mouth/Throat: Oropharynx is clear and moist. No oropharyngeal exudate.  Eyes: Conjunctivae and EOM are normal. Pupils are equal, round, and reactive to light. No scleral icterus.  Neck: Normal range of motion. Neck supple. No JVD present. No tracheal deviation present. No thyromegaly present.  Cardiovascular: Normal rate, regular rhythm and normal heart sounds.  Exam reveals no gallop and no friction rub.   No murmur heard. Pulmonary/Chest: Effort normal and breath sounds normal. No respiratory distress. She has no wheezes. She exhibits no tenderness.  Abdominal: Soft.  Bowel sounds are normal. She exhibits no distension and no mass. There is tenderness. There is no rebound and no guarding.  RLQ TTP  Musculoskeletal: Normal range of motion. She exhibits no edema or tenderness.  Lymphadenopathy:    She has no cervical adenopathy.  Neurological: She is alert and oriented to person, place, and time. No cranial nerve deficit. She exhibits normal muscle tone.  Skin: Skin is warm and dry. No rash noted. No erythema. No pallor.  Nursing note and vitals reviewed.   ED Course  Procedures (including critical care time) Labs Review Labs Reviewed  COMPREHENSIVE METABOLIC PANEL - Abnormal; Notable for the following:    Potassium 3.4 (*)    Glucose, Bld 134 (*)    Calcium 8.7 (*)  Total Bilirubin 0.2 (*)    All other components within normal limits  CBC WITH DIFFERENTIAL/PLATELET  LIPASE, BLOOD  URINALYSIS, ROUTINE W REFLEX MICROSCOPIC (NOT AT [facility])  I-STAT CG4 LACTIC ACID, ED  POC URINE PREG, ED    Imaging Review US Transvaginal Non-ob  07/16/2015  CLINICAL DATA:  Acute onset right lower quadrant abdominal pain. Initial encounter. EXAM: TRANSABDOMINAL AND TRANSVAGINAL ULTRASOUND OF PELVIS TECHNIQUE: Both transabdominal and transvaginal ultrasound examinations of the pelvis were performed. Transabdominal technique was performed for global imaging of the pelvis including uterus, ovaries, adnexal regions, and pelvic cul-de-sac. It was necessary to proceed with endovaginal exam following the transabdominal exam to visualize the ovaries in greater detail. COMPARISON:  CT of the abdomen and pelvis performed 07/15/2015 FINDINGS: Uterus Measurements: 9.2 x 4.7 x 6.9 cm. Anterior and posterior uterine fibroids are seen, measuring 5.4 cm and 5.0 cm in size. Endometrium Thickness: 0.8 cm.  No focal abnormality visualized. Right ovary Not visualized. Left ovary Not visualized. Other findings No free fluid is seen within the pelvic cul-de-sac. IMPRESSION: 1. The ovaries are  not seen. No ancillary findings seen to suggest ovarian torsion. Evaluation is suboptimal due to patient's pelvic pain. 2. Large anterior and posterior uterine fibroids, measuring 5.4 cm and 5.0 cm in size. Uterus otherwise unremarkable. Electronically Signed   By: Garald Balding M.D.   On: 07/16/2015 06:07   US Pelvis Complete  07/16/2015  CLINICAL DATA:  Acute onset right lower quadrant abdominal pain. Initial encounter. EXAM: TRANSABDOMINAL AND TRANSVAGINAL ULTRASOUND OF PELVIS TECHNIQUE: Both transabdominal and transvaginal ultrasound examinations of the pelvis were performed. Transabdominal technique was performed for global imaging of the pelvis including uterus, ovaries, adnexal regions, and pelvic cul-de-sac. It was necessary to proceed with endovaginal exam following the transabdominal exam to visualize the ovaries in greater detail. COMPARISON:  CT of the abdomen and pelvis performed 07/15/2015 FINDINGS: Uterus Measurements: 9.2 x 4.7 x 6.9 cm. Anterior and posterior uterine fibroids are seen, measuring 5.4 cm and 5.0 cm in size. Endometrium Thickness: 0.8 cm.  No focal abnormality visualized. Right ovary Not visualized. Left ovary Not visualized. Other findings No free fluid is seen within the pelvic cul-de-sac. IMPRESSION: 1. The ovaries are not seen. No ancillary findings seen to suggest ovarian torsion. Evaluation is suboptimal due to patient's pelvic pain. 2. Large anterior and posterior uterine fibroids, measuring 5.4 cm and 5.0 cm in size. Uterus otherwise unremarkable. Electronically Signed   By: Garald Balding M.D.   On: 07/16/2015 06:07   Ct Abdomen Pelvis W Contrast  07/15/2015  CLINICAL DATA:  Right lower quadrant pain and nausea, worsening over the past 5 days. Chronic constipation. History of fibroids. Evaluation for appendicitis. EXAM: CT ABDOMEN AND PELVIS WITH CONTRAST TECHNIQUE: Multidetector CT imaging of the abdomen and pelvis was performed using the standard protocol following  bolus administration of intravenous contrast. CONTRAST:  187mL OMNIPAQUE IOHEXOL 300 MG/ML  SOLN COMPARISON:  09/21/2014 FINDINGS: The visualized lung bases are free of consolidation or fusion. No focal liver lesion is identified. There is no biliary dilatation. The gallbladder, spleen, adrenal glands, kidneys, and pancreas are unremarkable. Oral contrast is present in multiple loops of nondilated small bowel. There is no evidence of bowel obstruction. The cecum is located in the right mid upper abdomen. The appendix is unremarkable. Multiple uterine masses are again seen, compatible with fibroids, measuring up to 6 cm in size, and not significantly changed from the prior study. Mild enlargement of both ovarian veins is unchanged.  The bladder is largely decompressed. No free fluid is seen. Mildly prominent mesenteric lymph nodes in the right abdomen are unchanged. No acute osseous abnormality is identified. IMPRESSION: 1. No acute abnormality identified in the abdomen or pelvis. Normal appendix. 2. Fibroid uterus. Electronically Signed   By: Logan Bores M.D.   On: 07/15/2015 16:16   I have personally reviewed and evaluated these images and lab results as part of my medical decision-making.   EKG Interpretation None      MDM   Final diagnoses:  RLQ abdominal pain   Patient presents to the ED for worsening abd pain.  Her prior CT is negative for appendicitis.  TVUS ordered to eval for torsion vs a mass.  Only reveals a fibroid uterus.  She was given dilaudid x 2 doses for pain.  She still is not comfortable in the room and will require admission for further evaluation.    Everlene Balls, MD 07/16/15 [phone]

## 2015-07-16 NOTE — ED Notes (Signed)
IV attempt x 2 unsuccessful.

## 2015-07-16 NOTE — ED Notes (Signed)
The pt is c/o abd since yesterday  She was seen by her doctor yesterday and had blood work and a cp-t scan

## 2015-07-16 NOTE — ED Notes (Signed)
The pt is hysterical hyperventilating  Crying  Not speaking.  Her daughter is speaking for her.  Last bm was yesterday normal.  Her daughter reports that the pt could nolt walk when she went to pick her up and bring her here.  She is grabbing onto her abd.  She was given a shot in the doctors office yesterday

## 2015-07-16 NOTE — H&P (Signed)
[facility] History and Physical  Marilyn [name] [id number] DOB: 1966-03-17 DOA: 07/16/2015  Referring physician:  PCP: [name], MD   Chief Complaint: Abdominal Pain  HPI: Marilyn [name] is a 50 y.o. female presenting to the emergency department with intractable, progressive, 1 week history of right lower abdominal pain. The pain is worse with movement, radiating to the back, and partially extending to the right anterior thigh. Initially, this was treated with Tylenol, but the symptoms progressed. She denies any fever, chills or night sweats. She denies any nausea, vomiting, diarrhea or constipation. She denies any worsening reflux. She denies any dysuria, frequency urgency. She reports some flank and bilateral lower back pain. She denies any hematuria. She reports as "something grinding in the right ovary". She denies any vaginal bleeding or discharge. Her LMP was 2 years ago. She is up to date with her Gyn visits.  She had been seen by her PCP on 07/15/2015, at which time, a CT of the abdomen and pelvis was performed, which was negative for acute abnormalities, except for fibroid uterus, confirmed by Transvaginal ultrasound. Last Pap was in 2014, negative. The patient had been given hydrocodone as needed without significant improvement.At the ED, she received IV Dilaudid without alleviation of symptoms.CBC and CMET are essentially unremarkable. She is a favorable, with the rest of the vital signs stable.UA is negative. Urine culture is pending.   Review of Systems  Constitutional: Positive for weight loss and malaise/fatigue. Negative for fever, chills and diaphoresis.       4 lbs weight loss due to decreased appetite due to sx  HENT: Negative.   Eyes: Negative.   Respiratory: Negative.   Cardiovascular: Negative.   Gastrointestinal: Positive for heartburn and abdominal pain. Negative for nausea, vomiting, diarrhea, constipation, blood in stool and melena.       Nausea and  vomiting after taking toradol, now resolved   Genitourinary: Negative for dysuria, urgency, frequency, hematuria and flank pain.       She reports back pain and hip pain Suprapubic pain   Musculoskeletal: Positive for back pain. Negative for myalgias, joint pain, falls and neck pain.  Skin: Negative.   Neurological: Negative.  Negative for weakness.  Endo/Heme/Allergies: Negative for environmental allergies and polydipsia. Does not bruise/bleed easily.  Psychiatric/Behavioral: Negative.     Past Medical History  Diagnosis Date  . Hyperlipidemia   . HYPOTHYROIDISM 01/30/2007  . 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/2007  . ASTHMA 01/30/2007  . GERD 01/31/2007  . Hiatal hernia   . Jobos tear 04/2003  . Hemorrhoids   . HTN (hypertension) 09/09/2011  . Breast tumor     right   Past Surgical History  Procedure Laterality Date  . Tubal ligation  1987  . Broken jaw    . Breast reduction surgery  1986  . Carpal tunnel release  2001  . Thyroidectomy    . Breast lumpectomy with radioactive seed localization Right 02/25/2015    Procedure: BREAST LUMPECTOMY WITH RADIOACTIVE SEED LOCALIZATION;  Surgeon: Alphonsa Overall, MD;  Location: [facility];  Service: General;  Laterality: Right;   Social History:  reports that she quit smoking about 35 years ago. Her smoking use included Cigarettes. She has a .75 pack-year smoking history. She has never used smokeless tobacco. She reports that she does not drink alcohol or use illicit drugs.  Allergies  Allergen Reactions  . Tuna [Fish Allergy] Anaphylaxis  . Codeine Nausea And Vomiting  . Flonase [Fluticasone Propionate] Other (See  Comments)    Nose bleeds    Family History  Problem Relation Age of Onset  . Hypertension Father   . Diabetes Father   . Breast cancer Sister   . Colon cancer Neg Hx   . Diabetes Maternal Grandmother   . Heart disease Maternal Grandmother   . Asthma Mother   . Asthma Maternal Grandfather      Prior  to Admission medications   Medication Sig Start Date End Date Taking? Authorizing Provider  acetaminophen (TYLENOL) 325 MG tablet Take 325 mg by mouth every 6 (six) hours as needed for moderate pain.   Yes Historical Provider, MD  albuterol (PROVENTIL HFA;VENTOLIN HFA) 108 (90 Base) MCG/ACT inhaler Inhale 2 puffs into the lungs every 4 (four) hours as needed for wheezing or shortness of breath (cough, shortness of breath or wheezing.). 06/26/15  Yes Stephanie D English, PA  amLODipine (NORVASC) 10 MG tablet TAKE ONE TABLET BY MOUTH ONCE DAILY 07/20/14  Yes Biagio Borg, MD  aspirin 325 MG tablet Take 325 mg by mouth daily.   Yes Historical Provider, MD  atorvastatin (LIPITOR) 20 MG tablet Take 1 tablet (20 mg total) by mouth daily. 04/15/15 04/14/16 Yes Biagio Borg, MD  Cholecalciferol (VITAMIN D3) 5000 UNITS CAPS Take 1 capsule by mouth 2 (two) times a week.   Yes Historical Provider, MD  Cyanocobalamin (VITAMIN B 12 PO) Take 1 tablet by mouth daily.   Yes Historical Provider, MD  EPINEPHrine 0.3 mg/0.3 mL IJ SOAJ injection Inject 0.3 mLs (0.3 mg total) into the muscle once. 11/10/14  Yes Everlene Balls, MD  Guaifenesin (MUCINEX MAXIMUM STRENGTH) 1200 MG TB12 Take 1 tablet (1,200 mg total) by mouth every 12 (twelve) hours as needed. 06/26/15  Yes Stephanie D English, PA  hydrochlorothiazide (MICROZIDE) 12.5 MG capsule Take 1 capsule (12.5 mg total) by mouth daily. As needed for swelling 04/07/14  Yes Biagio Borg, MD  HYDROcodone-acetaminophen [facility]) 7.5-325 MG tablet Take 1 tablet by mouth every 6 (six) hours as needed for moderate pain. 07/15/15  Yes Biagio Borg, MD  hydroxypropyl methylcellulose / hypromellose (ISOPTO TEARS / GONIOVISC) 2.5 % ophthalmic solution Place 1 drop into both eyes at bedtime.   Yes Historical Provider, MD  lansoprazole (PREVACID) 30 MG capsule Take 1 capsule (30 mg total) by mouth 2 (two) times daily before a meal. 02/22/15  Yes Ladene Artist, MD  levothyroxine (SYNTHROID,  LEVOTHROID) 125 MCG tablet TAKE ONE TABLET BY MOUTH ONCE DAILY BEFORE BREAKFAST. YEARLY PHYSICAL WITH LABS ARE DUE MUST SEE MD FOR REFILLS 06/15/15  Yes Biagio Borg, MD  loratadine (CLARITIN) 10 MG tablet Take 10 mg by mouth daily as needed for allergies.   Yes Historical Provider, MD  Multiple Vitamin (MULTIVITAMIN WITH MINERALS) TABS tablet Take 1 tablet by mouth daily.   Yes Historical Provider, MD  azithromycin (ZITHROMAX) 250 MG tablet Take 2 tabs PO x 1 dose, then 1 tab PO QD x 4 days Patient not taking: Reported on 07/16/2015 06/26/15   Dorian Heckle English, PA  benzonatate (TESSALON) 100 MG capsule Take 1-2 capsules (100-200 mg total) by mouth 3 (three) times daily as needed for cough. Patient not taking: Reported on 07/16/2015 06/26/15   Joretta Bachelor, [place]   Physical Exam: Filed Vitals:   07/16/15 0400 07/16/15 0409 07/16/15 0651 07/16/15 0738  BP: 146/99  131/86 116/68  Pulse: 108  97 91  Temp: 98.2 F (36.8 C)     Resp: 28  18  Height:  5\' 2"  (1.575 m)    Weight:  90.833 kg (200 lb 4 oz)    SpO2: 98%  100% 99%    Wt Readings from Last 3 Encounters:  07/16/15 90.833 kg (200 lb 4 oz)  07/15/15 90.833 kg (200 lb 4 oz)  06/26/15 90.719 kg (200 lb)    Physical Exam  Constitutional: She is oriented to person, place, and time. She appears distressed.  HENT:  Head: Normocephalic and atraumatic.  Mouth/Throat: Oropharynx is clear and moist. No oropharyngeal exudate.  Eyes: EOM are normal. Pupils are equal, round, and reactive to light. No scleral icterus.  Neck: Normal range of motion. Neck supple. No JVD present. No thyromegaly present.  Cardiovascular: Normal rate and regular rhythm.   Murmur heard. Soft, 1/6 syst. murmur  Pulmonary/Chest: Effort normal and breath sounds normal. No stridor. No respiratory distress. She has no wheezes. She has no rales. She exhibits no tenderness.  Abdominal: Soft. Bowel sounds are normal. She exhibits mass. She exhibits no distension. There  is tenderness. There is rebound and guarding.  exquisit TTP at the suprapubic region, with guarding  Musculoskeletal: Normal range of motion. She exhibits tenderness. She exhibits no edema.  Positive back tenderness  Lymphadenopathy:    She has no cervical adenopathy.  Neurological: She is alert and oriented to person, place, and time. She displays normal reflexes. No cranial nerve deficit. Coordination normal.  Skin: Skin is warm and dry. No rash noted. She is not diaphoretic. No erythema. No pallor.  Psychiatric: Memory, affect and judgment normal.  Tearful due to pain            Labs on Admission:  Basic Metabolic Panel:  Recent Labs Lab 07/16/15 0436  NA 138  K 3.4*  CL 103  CO2 24  GLUCOSE 134*  BUN 15  CREATININE 0.79  CALCIUM 8.7*    Liver Function Tests:  Recent Labs Lab 07/16/15 0436  AST 22  ALT 19  ALKPHOS 70  BILITOT 0.2*  PROT 7.5  ALBUMIN 3.7    Recent Labs Lab 07/16/15 0436  LIPASE 28   No results for input(s): AMMONIA in the last 168 hours.  CBC:  Recent Labs Lab 07/15/15 1703 07/16/15 0436  WBC 5.3 4.8  NEUTROABS 2.7 2.4  HGB 12.6 12.0  HCT 37.2 36.6  MCV 81.0 81.2  PLT 262 231    Cardiac Enzymes: No results for input(s): CKTOTAL, CKMB, CKMBINDEX, TROPONINI in the last 168 hours.  BNP (last 3 results)  Recent Labs  09/28/14 1528  BNP 9.7    Radiological Exams on Admission: US Transvaginal Non-ob  07/16/2015  CLINICAL DATA:  Acute onset right lower quadrant abdominal pain. Initial encounter. EXAM: TRANSABDOMINAL AND TRANSVAGINAL ULTRASOUND OF PELVIS TECHNIQUE: Both transabdominal and transvaginal ultrasound examinations of the pelvis were performed. Transabdominal technique was performed for global imaging of the pelvis including uterus, ovaries, adnexal regions, and pelvic cul-de-sac. It was necessary to proceed with endovaginal exam following the transabdominal exam to visualize the ovaries in greater detail. COMPARISON:   CT of the abdomen and pelvis performed 07/15/2015 FINDINGS: Uterus Measurements: 9.2 x 4.7 x 6.9 cm. Anterior and posterior uterine fibroids are seen, measuring 5.4 cm and 5.0 cm in size. Endometrium Thickness: 0.8 cm.  No focal abnormality visualized. Right ovary Not visualized. Left ovary Not visualized. Other findings No free fluid is seen within the pelvic cul-de-sac. IMPRESSION: 1. The ovaries are not seen. No ancillary findings seen to suggest ovarian torsion.  Evaluation is suboptimal due to patient's pelvic pain. 2. Large anterior and posterior uterine fibroids, measuring 5.4 cm and 5.0 cm in size. Uterus otherwise unremarkable. Electronically Signed   By: Garald Balding M.D.   On: 07/16/2015 06:07   US Pelvis Complete  07/16/2015  CLINICAL DATA:  Acute onset right lower quadrant abdominal pain. Initial encounter. EXAM: TRANSABDOMINAL AND TRANSVAGINAL ULTRASOUND OF PELVIS TECHNIQUE: Both transabdominal and transvaginal ultrasound examinations of the pelvis were performed. Transabdominal technique was performed for global imaging of the pelvis including uterus, ovaries, adnexal regions, and pelvic cul-de-sac. It was necessary to proceed with endovaginal exam following the transabdominal exam to visualize the ovaries in greater detail. COMPARISON:  CT of the abdomen and pelvis performed 07/15/2015 FINDINGS: Uterus Measurements: 9.2 x 4.7 x 6.9 cm. Anterior and posterior uterine fibroids are seen, measuring 5.4 cm and 5.0 cm in size. Endometrium Thickness: 0.8 cm.  No focal abnormality visualized. Right ovary Not visualized. Left ovary Not visualized. Other findings No free fluid is seen within the pelvic cul-de-sac. IMPRESSION: 1. The ovaries are not seen. No ancillary findings seen to suggest ovarian torsion. Evaluation is suboptimal due to patient's pelvic pain. 2. Large anterior and posterior uterine fibroids, measuring 5.4 cm and 5.0 cm in size. Uterus otherwise unremarkable. Electronically Signed   By:  Garald Balding M.D.   On: 07/16/2015 06:07   Ct Abdomen Pelvis W Contrast  07/15/2015  CLINICAL DATA:  Right lower quadrant pain and nausea, worsening over the past 5 days. Chronic constipation. History of fibroids. Evaluation for appendicitis. EXAM: CT ABDOMEN AND PELVIS WITH CONTRAST TECHNIQUE: Multidetector CT imaging of the abdomen and pelvis was performed using the standard protocol following bolus administration of intravenous contrast. CONTRAST:  128mL OMNIPAQUE IOHEXOL 300 MG/ML  SOLN COMPARISON:  09/21/2014 FINDINGS: The visualized lung bases are free of consolidation or fusion. No focal liver lesion is identified. There is no biliary dilatation. The gallbladder, spleen, adrenal glands, kidneys, and pancreas are unremarkable. Oral contrast is present in multiple loops of nondilated small bowel. There is no evidence of bowel obstruction. The cecum is located in the right mid upper abdomen. The appendix is unremarkable. Multiple uterine masses are again seen, compatible with fibroids, measuring up to 6 cm in size, and not significantly changed from the prior study. Mild enlargement of both ovarian veins is unchanged. The bladder is largely decompressed. No free fluid is seen. Mildly prominent mesenteric lymph nodes in the right abdomen are unchanged. No acute osseous abnormality is identified. IMPRESSION: 1. No acute abnormality identified in the abdomen or pelvis. Normal appendix. 2. Fibroid uterus. Electronically Signed   By: Logan Bores M.D.   On: 07/15/2015 16:16        Assessment/Plan Active Problems:   Hypothyroidism   Hyperlipidemia   GERD   HTN (hypertension)   Pain in lower back   RLQ abdominal pain   Nausea & vomiting  RLQ abdominal pain: Etiology unknown at this time. CT of the abdomen and pelvis, Pelvic ultrasound, and transvaginal ultrasound are remarkable for a large fibroid which may account for her symptoms. UA is negative, cultures pending  GYN evaluation while in the  hospital, Dr. Ouida Sills [facility]) We will admit for Observation at [place] IV pain meds as needed for transition to oral pain meds and pain improves IV fluids  Lower Back Pain This has been present over the last few months, may have a radiculopathic component as her pain is worse with movement. CT abdomen shows  stable mild mesenteric adenopathy May need further workup as outpatient   Nausea and vomiting. Symptoms are present after pain meds administered. CT of the abdomen and pelvis is negative for acute abdominal etiology. No diarrhea is present. We will provide with IV antiemetics and home Prevacid for comfort, IVFs Continue to monitor  GERD Continue home Prevacid  Hypertension VS are stable Continue home anti-hypertensive medications including  ASA, Norvasc, HCTZ  Hyperlipidemia Continue Lipitor  Hypothyroidism Continue Synthroid  Code Status: Full Code  DVT Prophylaxis: Lovenox Family Communication:  Family at bedside Disposition Plan: Pending Improvement. Admitted for observation in tele bed. Expected LOS 24hrs    [facility] E,PA-C [facility] [email or web address] Password [email or web address]

## 2015-07-17 DIAGNOSIS — D259 Leiomyoma of uterus, unspecified: Secondary | ICD-10-CM | POA: Diagnosis not present

## 2015-07-17 DIAGNOSIS — M25551 Pain in right hip: Secondary | ICD-10-CM | POA: Diagnosis not present

## 2015-07-17 DIAGNOSIS — M47899 Other spondylosis, site unspecified: Secondary | ICD-10-CM | POA: Diagnosis not present

## 2015-07-17 DIAGNOSIS — M545 Low back pain: Secondary | ICD-10-CM | POA: Diagnosis not present

## 2015-07-17 DIAGNOSIS — E038 Other specified hypothyroidism: Secondary | ICD-10-CM

## 2015-07-17 DIAGNOSIS — I10 Essential (primary) hypertension: Secondary | ICD-10-CM | POA: Diagnosis not present

## 2015-07-17 LAB — CBC
HCT: 34.9 % — ABNORMAL LOW (ref 36.0–46.0)
Hemoglobin: 11.4 g/dL — ABNORMAL LOW (ref 12.0–15.0)
MCH: 27 pg (ref 26.0–34.0)
MCHC: 32.7 g/dL (ref 30.0–36.0)
MCV: 82.5 fL (ref 78.0–100.0)
Platelets: 216 10*3/uL (ref 150–400)
RBC: 4.23 MIL/uL (ref 3.87–5.11)
RDW: 13 % (ref 11.5–15.5)
WBC: 3.6 10*3/uL — AB (ref 4.0–10.5)

## 2015-07-17 LAB — BASIC METABOLIC PANEL
Anion gap: 9 (ref 5–15)
BUN: 8 mg/dL (ref 6–20)
CALCIUM: 8.1 mg/dL — AB (ref 8.9–10.3)
CHLORIDE: 105 mmol/L (ref 101–111)
CO2: 27 mmol/L (ref 22–32)
CREATININE: 0.81 mg/dL (ref 0.44–1.00)
Glucose, Bld: 104 mg/dL — ABNORMAL HIGH (ref 65–99)
Potassium: 3.5 mmol/L (ref 3.5–5.1)
SODIUM: 141 mmol/L (ref 135–145)

## 2015-07-17 MED ORDER — PREDNISONE 50 MG PO TABS
60.0000 mg | ORAL_TABLET | Freq: Every day | ORAL | Status: DC
  Administered 2015-07-17 – 2015-07-21 (×5): 60 mg via ORAL
  Filled 2015-07-17 (×5): qty 1

## 2015-07-17 MED ORDER — CYCLOBENZAPRINE HCL 5 MG PO TABS
5.0000 mg | ORAL_TABLET | Freq: Three times a day (TID) | ORAL | Status: DC | PRN
  Administered 2015-07-17 – 2015-07-20 (×4): 5 mg via ORAL
  Filled 2015-07-17 (×4): qty 1

## 2015-07-17 MED ORDER — POTASSIUM CHLORIDE CRYS ER 20 MEQ PO TBCR
40.0000 meq | EXTENDED_RELEASE_TABLET | Freq: Once | ORAL | Status: AC
  Administered 2015-07-17: 40 meq via ORAL
  Filled 2015-07-17: qty 2

## 2015-07-17 MED ORDER — KETOROLAC TROMETHAMINE 15 MG/ML IJ SOLN
7.5000 mg | Freq: Three times a day (TID) | INTRAMUSCULAR | Status: DC
  Administered 2015-07-17 – 2015-07-20 (×10): 7.5 mg via INTRAVENOUS
  Filled 2015-07-17 (×10): qty 1

## 2015-07-17 NOTE — Progress Notes (Addendum)
PROGRESS NOTE  Marilyn [name] [id number] DOB: [birth year] DOA: 07/16/2015 PCP: [name], MD  HPI/Recap of past 24 hours:  C/o right sided pain, exacerbated by movement  Assessment/Plan: Principal Problem:   RLQ abdominal pain Active Problems:   Hypothyroidism   Hyperlipidemia   GERD   HTN (hypertension)   Pain in lower back   Nausea & vomiting  Right sided pain, less likely ab pain, more likely related to musculoskeletal. i have reviewed CT ab /pel with radiologist over the phone, on obvious pathology on imaging except uterine fibroid. On physical exam, she does have significant tenderness over right SI joint and significant tenderness over right hip medially. Will try flexeril/prednisone/toradol, will get physical therapy.  I have talked to Gyn Dr Ouida Sills who advised patient to f/u with gyn outpatient. He states no need of inpatient consult.  HTN; stable on home meds  Hypothyroidism: tsh 7,  Synthroid dose adjustment, need to repeat tsh in 4weeks.  Hypokalemia: replace k, check mag  H/o asthma: stable, lung clear  Uterine fibroid: I have talked to [facility] GYN Dr Ouida Sills, he advised patient to follow up with gyn outpatient.   Code Status: full  Family Communication: patient , her daughter and son in room  Disposition Plan: home tomorrow if able to ambulate   Consultants:  Phone conversation with Dr Ouida Sills  Phone conversation with Radiology Dr. [place] Pink to review CT ab/pel  Procedures:  none  Antibiotics:  none   Objective: BP 109/64 mmHg  Pulse 80  Temp(Src) 98.7 F (37.1 C) (Oral)  Resp 17  Ht 5\' 4"  (1.626 m)  Wt 90.719 kg (200 lb)  BMI 34.31 kg/m2  SpO2 100%  LMP 02/21/2013  Intake/Output Summary (Last 24 hours) at 07/17/15 1401 Last data filed at 07/17/15 0531  Gross per 24 hour  Intake 2112.67 ml  Output      0 ml  Net 2112.67 ml   Filed Weights   07/16/15 0409 07/16/15 1040  Weight: 90.833 kg (200 lb 4 oz) 90.719 kg  (200 lb)    Exam:   General:  NAD  Cardiovascular: RRR  Respiratory: CTABL  Abdomen: Soft/ND/NT, positive BS  Musculoskeletal: No Edema, tenderness at right SI joint, tenderness right hip medially, not able to raise leg against resistance due to significant right hip pain  Neuro: aaox3  Data Reviewed: Basic Metabolic Panel:  Recent Labs Lab 07/15/15 1651 07/16/15 0436 07/16/15 0929 07/17/15 0551  NA 137 138  --  141  K 3.6 3.4*  --  3.5  CL 101 103  --  105  CO2 28 24  --  27  GLUCOSE 93 134*  --  104*  BUN 12 15  --  8  CREATININE 0.78 0.79 0.73 0.81  CALCIUM 9.0 8.7*  --  8.1*   Liver Function Tests:  Recent Labs Lab 07/16/15 0436  AST 22  ALT 19  ALKPHOS 70  BILITOT 0.2*  PROT 7.5  ALBUMIN 3.7    Recent Labs Lab 07/15/15 1651 07/16/15 0436  LIPASE 72.0* 28   No results for input(s): AMMONIA in the last 168 hours. CBC:  Recent Labs Lab 07/15/15 1703 07/16/15 0436 07/17/15 0551  WBC 5.3 4.8 3.6*  NEUTROABS 2.7 2.4  --   HGB 12.6 12.0 11.4*  HCT 37.2 36.6 34.9*  MCV 81.0 81.2 82.5  PLT 262 231 216   Cardiac Enzymes:   No results for input(s): CKTOTAL, CKMB, CKMBINDEX, TROPONINI in the last 168 hours.  BNP (last 3 results)  Recent Labs  09/28/14 1528  BNP 9.7    ProBNP (last 3 results) No results for input(s): PROBNP in the last 8760 hours.  CBG: No results for input(s): GLUCAP in the last 168 hours.  Recent Results (from the past 240 hour(s))  Urine culture     Status: None   Collection Time: 07/15/15  4:35 PM  Result Value Ref Range Status   Colony Count 40,000 COLONIES/ML  Final   Organism ID, Bacteria Multiple bacterial morphotypes present, none  Final   Organism ID, Bacteria predominant. Suggest appropriate recollection if   Final   Organism ID, Bacteria clinically indicated.  Final     Studies: No results found.  Scheduled Meds: . amLODipine  10 mg Oral Daily  . aspirin  325 mg Oral Daily  . atorvastatin  20 mg  Oral Daily  . docusate sodium  100 mg Oral BID  . enoxaparin (LOVENOX) injection  40 mg Subcutaneous Q24H  . hydrochlorothiazide  12.5 mg Oral Daily  . ketorolac  7.5 mg Intravenous 3 times per day  . levothyroxine  125 mcg Oral QAC breakfast  . pantoprazole  20 mg Oral Daily  . polyvinyl alcohol  1 drop Both Eyes QHS  . potassium chloride  40 mEq Oral Once  . predniSONE  60 mg Oral Q breakfast  . senna  1 tablet Oral BID    Continuous Infusions:    Time spent: 81mins  Sandra Brents MD, PhD  [facility] Pager [phone]. If 7PM-7AM, please contact night-coverage at [email or web address], password [email or web address] 07/17/2015, 2:01 PM

## 2015-07-17 NOTE — Evaluation (Signed)
Physical Therapy Evaluation Patient Details Name: Marilyn [name] MRN: [id number] DOB: March 29, 1966 Today's Date: 07/17/2015   History of Present Illness  Marilyn [name] is a 50 y.o. female with a pmh anemia, uterine fibroids, GERD, and hypothyroidism; who presents with progressively worsening right lower quadrant abdominal/ suprapubic pain over the last week. Labs neg, likely musculoskeletal pain; tender at R SI joint  Clinical Impression   Pt admitted with above diagnosis. Pt currently with functional limitations due to the deficits listed below (see PT Problem List). She responded well to a tight belt around her sacrum and ilia to stabilize SI joint during gait;  Pt will benefit from skilled PT to increase their independence and safety with mobility to allow discharge to the venue listed below.       Follow Up Recommendations Outpatient PT    Equipment Recommendations  Rolling walker with 5" wheels    Recommendations for Other Services       Precautions / Restrictions Restrictions Weight Bearing Restrictions: No      Mobility  Bed Mobility Overal bed mobility: Modified Independent             General bed mobility comments: slow moving, and painful, but not needing assist  Transfers Overall transfer level: Needs assistance Equipment used: None Transfers: Sit to/from Stand Sit to Stand: Supervision         General transfer comment: slow moving and painful  Ambulation/Gait Ambulation/Gait assistance: Supervision Ambulation Distance (Feet): 80 Feet Assistive device: None;Rolling walker (2 wheeled) Gait Pattern/deviations: Decreased step length - right;Decreased step length - left;Decreased stride length Gait velocity: very slow   General Gait Details: Applied gait belt tightly around ilia and sacrum to provide more stability at SI joint with amb; she was able to walk and her pain level was decr from a 5 to a three with the use of the belt; towards the end of  the walk she used the RW to [employer] painful RLE/SI with some success  Stairs            Wheelchair Mobility    Modified Rankin (Stroke Patients Only)       Balance                                             Pertinent Vitals/Pain Pain Assessment: 0-10 Pain Score:  (3-5) Pain Location: Mostly at R SI joint Pain Descriptors / Indicators: Aching (and occasionally sharp) Pain Intervention(s): Monitored during session    Home Living Family/patient expects to be discharged to:: Private residence Living Arrangements: Children Available Help at Discharge: Family Type of Home: Apartment Home Access: Level entry     Home Layout: One level Home Equipment: None      Prior Function Level of Independence: Independent         Comments: Designer, jewellery        Extremity/Trunk Assessment   Upper Extremity Assessment: Overall WFL for tasks assessed           Lower Extremity Assessment:  (Pain limiting BLE hip ROM with walking and functional mobility activities)         Communication   Communication: No difficulties  Cognition Arousal/Alertness: Awake/alert Behavior During Therapy: WFL for tasks assessed/performed Overall Cognitive Status: Within Functional Limits for tasks assessed  General Comments General comments (skin integrity, edema, etc.): We discussed pursuing Outpatient PT for SI and lower back pain    Exercises        Assessment/Plan    PT Assessment Patient needs continued PT services  PT Diagnosis Difficulty walking;Acute pain   PT Problem List Decreased activity tolerance;Decreased mobility;Pain  PT Treatment Interventions DME instruction;Gait training;Stair training;Functional mobility training;Therapeutic activities;Therapeutic exercise;Patient/family education   PT Goals (Current goals can be found in the Care Plan section) Acute Rehab PT  Goals Patient Stated Goal: less pain and return to work PT Goal Formulation: With patient Time For Goal Achievement: 07/24/15 Potential to Achieve Goals: Good    Frequency Min 3X/week   Barriers to discharge        Co-evaluation               End of Session Equipment Utilized During Treatment: Gait belt;Other (comment) (as an SI belt) Activity Tolerance: Patient tolerated treatment well Patient left: in bed;with call bell/phone within reach;with family/visitor present Nurse Communication: Mobility status    Functional Assessment Tool Used: CLinical judgement Functional Limitation: Mobility: Walking and moving around Mobility: Walking and Moving Around Current Status [id number]): At least 1 percent but less than 20 percent impaired, limited or restricted Mobility: Walking and Moving Around Goal Status [phone]): 0 percent impaired, limited or restricted    Time: [id number] PT Time Calculation (min) (ACUTE ONLY): 21 min   Charges:   PT Evaluation $PT Eval Low Complexity: 1 Procedure     PT G Codes:   PT G-Codes **NOT FOR INPATIENT CLASS** Functional Assessment Tool Used: CLinical judgement Functional Limitation: Mobility: Walking and moving around Mobility: Walking and Moving Around Current Status [id number]): At least 1 percent but less than 20 percent impaired, limited or restricted Mobility: Walking and Moving Around Goal Status [phone]): 0 percent impaired, limited or restricted    Marilyn [name] [facility] 07/17/2015, 5:43 PM  Marilyn [name], [place] Pager [phone] Office [phone]

## 2015-07-18 ENCOUNTER — Telehealth: Payer: Self-pay | Admitting: Internal Medicine

## 2015-07-18 ENCOUNTER — Observation Stay (HOSPITAL_COMMUNITY): Payer: BC Managed Care – PPO

## 2015-07-18 DIAGNOSIS — I10 Essential (primary) hypertension: Secondary | ICD-10-CM | POA: Diagnosis not present

## 2015-07-18 DIAGNOSIS — E038 Other specified hypothyroidism: Secondary | ICD-10-CM | POA: Diagnosis not present

## 2015-07-18 DIAGNOSIS — M47899 Other spondylosis, site unspecified: Secondary | ICD-10-CM | POA: Diagnosis not present

## 2015-07-18 LAB — BASIC METABOLIC PANEL
ANION GAP: 11 (ref 5–15)
BUN: 16 mg/dL (ref 6–20)
CALCIUM: 8.8 mg/dL — AB (ref 8.9–10.3)
CO2: 23 mmol/L (ref 22–32)
Chloride: 102 mmol/L (ref 101–111)
Creatinine, Ser: 0.83 mg/dL (ref 0.44–1.00)
GFR calc Af Amer: >60 mL/min (ref >60)
GLUCOSE: 159 mg/dL — AB (ref 65–99)
POTASSIUM: 4 mmol/L (ref 3.5–5.1)
SODIUM: 136 mmol/L (ref 135–145)

## 2015-07-18 LAB — CBC
HCT: 37.2 % (ref 36.0–46.0)
Hemoglobin: 12.2 g/dL (ref 12.0–15.0)
MCH: 26.6 pg (ref 26.0–34.0)
MCHC: 32.8 g/dL (ref 30.0–36.0)
MCV: 81.2 fL (ref 78.0–100.0)
PLATELETS: 253 10*3/uL (ref 150–400)
RBC: 4.58 MIL/uL (ref 3.87–5.11)
RDW: 12.7 % (ref 11.5–15.5)
WBC: 6.8 10*3/uL (ref 4.0–10.5)

## 2015-07-18 LAB — MAGNESIUM: MAGNESIUM: 2.2 mg/dL (ref 1.7–2.4)

## 2015-07-18 MED ORDER — INFLUENZA VAC SPLIT QUAD 0.5 ML IM SUSY
0.5000 mL | PREFILLED_SYRINGE | INTRAMUSCULAR | Status: AC
  Administered 2015-07-20: 0.5 mL via INTRAMUSCULAR
  Filled 2015-07-18: qty 0.5

## 2015-07-18 NOTE — Progress Notes (Signed)
Physical Therapy Treatment Patient Details Name: Marilyn [name] MRN: [id number] DOB: 1966/01/15 Today's Date: 07/18/2015    History of Present Illness Marilyn [name] is a 50 y.o. female with a pmh anemia, uterine fibroids, GERD, and hypothyroidism; who presents with progressively worsening right lower quadrant abdominal/ suprapubic pain over the last week. Imaging showing uterine fibroids; Labs neg, likely musculoskeletal pain; tender at R SI joint    PT Comments    Continuing to work on mobility despite pain; SI belt use equivocal as it did not help with pain with ambulation today; Introduced the idea of minimizing back motion while getting up, she performed log roll technique well;   Marilyn [name] voiced frustration with the pain which is relieved with medications, but she very much wants to get to the cause of the pain; Took time to validate pt's frustration with the situation, and gain some rapport, with goal of increasing pt's participation in therapy;   It is worth noting she described the pain onset as starting at upper back and shoulders, then the pain at her low back; it begins with a cramping sensation in her deep R lower abdomen and pelvis, then radiates out her R low back (mostly, though she describes involvement of her L side at times), and then around laterally and down her anterior R thigh; Pain worsens with low back flexion/slumping;   Described above characteristics of her pain with Dr. Erlinda Hong, who is considering consulting Ortho for a closer look.   Follow Up Recommendations  Outpatient PT     Equipment Recommendations  Rolling walker with 5" wheels (she will likely decline)    Recommendations for Other Services       Precautions / Restrictions      Mobility  Bed Mobility Overal bed mobility: Needs Assistance Bed Mobility: Rolling;Sidelying to Sit Rolling: Supervision Sidelying to sit: Supervision       General bed mobility comments: Took time today to  introduce log roll technique for getting in and out of bed to reduce motion at low back and abdomen; cues for technqiue and performed well, though no gross change in pain  Transfers Overall transfer level: Needs assistance Equipment used: None Transfers: Sit to/from Stand Sit to Stand: Supervision         General transfer comment: slow moving and painful  Ambulation/Gait Ambulation/Gait assistance: Supervision Ambulation Distance (Feet): 70 Feet Assistive device: None Gait Pattern/deviations: Step-through pattern;Decreased step length - right;Decreased step length - left;Decreased stride length;Antalgic Gait velocity: very slow   General Gait Details: Slow, guarded and antalgic gait; Trialed use of a belt tightly around sacrum and ilia again, half of the walk with it and half without it; no difference in pain with or smoothness of gait pattern, indicating the belt isn't very useful; She does not want to use the RW   Stairs Stairs:  (Discussed technique and sequence)          Wheelchair Mobility    Modified Rankin (Stroke Patients Only)       Balance                                    Cognition Arousal/Alertness: Awake/alert Behavior During Therapy: WFL for tasks assessed/performed Overall Cognitive Status: Within Functional Limits for tasks assessed                      Exercises  General Comments General comments (skin integrity, edema, etc.): Marilyn [name] voiced frustration re: not knowing cause of pain      Pertinent Vitals/Pain Pain Assessment: 0-10 Pain Score: 3  Pain Location: R low back and cramping, tightness lateral and anterior R thigh Pain Descriptors / Indicators: Aching;Cramping;Tightness Pain Intervention(s): Monitored during session;Repositioned (attempted SI belt again)    Home Living                      Prior Function            PT Goals (current goals can now be found in the care plan section)  Acute Rehab PT Goals Patient Stated Goal: less pain and return to work PT Goal Formulation: With patient Time For Goal Achievement: 07/24/15 Potential to Achieve Goals: Good Progress towards PT goals: Progressing toward goals    Frequency  Min 3X/week    PT Plan Current plan remains appropriate    Co-evaluation             End of Session Equipment Utilized During Treatment: Gait belt;Other (comment) (as an SI belt) Activity Tolerance: Patient tolerated treatment well Patient left: in chair;with call bell/phone within reach     Time: [id number] PT Time Calculation (min) (ACUTE ONLY): 25 min  Charges:  $Gait Training: 8-22 mins $Therapeutic Activity: 8-22 mins                    G Codes:      Quin Hoop 07/18/2015, 9:36 AM  Roney Marion, [place] Pager [phone] Office [phone]

## 2015-07-18 NOTE — Telephone Encounter (Signed)
Note of phone call is in the imaging tab if pt calls back.

## 2015-07-18 NOTE — Telephone Encounter (Signed)
See below

## 2015-07-18 NOTE — Telephone Encounter (Signed)
Patient called stating that she just missed a call from you. Advised that there are no notes on file. She is currently in the hospital working with PT

## 2015-07-18 NOTE — Consult Note (Signed)
[name], MD Chief Complaint: Back pain History: Marilyn [name] is a 50 y.o. female presenting to the emergency department with intractable, progressive, 1 week history of right lower abdominal pain. The pain is worse with movement, radiating to the back, and partially extending to the right anterior thigh. Initially, this was treated with Tylenol, but the symptoms progressed. She denies any fever, chills or night sweats. She denies any nausea, vomiting, diarrhea or constipation. She denies any worsening reflux. She denies any dysuria, frequency urgency. She reports some flank and bilateral lower back pain. She denies any hematuria. She reports as "something grinding in the right ovary". She denies any vaginal bleeding or discharge. Her LMP was 2 years ago. She is up to date with her Gyn visits. She had been seen by her PCP on 07/15/2015, at which time, a CT of the abdomen and pelvis was performed, which was negative for acute abnormalities, except for fibroid uterus, confirmed by Transvaginal ultrasound. Last Pap was in 2014, negative. The patient had been given hydrocodone as needed without significant improvement.At the ED, she received IV Dilaudid without alleviation of symptoms.CBC and CMET are essentially unremarkable. She is a favorable, with the rest of the vital signs stable.UA is negative. Urine culture is pending. Past Medical History  Diagnosis Date  . Hyperlipidemia   . HYPOTHYROIDISM 01/30/2007  . 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/2007  . ASTHMA 01/30/2007  . GERD 01/31/2007  . Hiatal hernia   . [place] tear 04/2003  . Hemorrhoids   . HTN (hypertension) 09/09/2011  . Breast tumor     right    Allergies  Allergen Reactions  . Tuna [Fish Allergy] Anaphylaxis  . Codeine Nausea And Vomiting  . Flonase [Fluticasone Propionate] Other (See Comments)    Nose bleeds    No current facility-administered medications on file prior to encounter.   Current Outpatient Prescriptions on  File Prior to Encounter  Medication Sig Dispense Refill  . acetaminophen (TYLENOL) 325 MG tablet Take 325 mg by mouth every 6 (six) hours as needed for moderate pain.    [name] albuterol (PROVENTIL HFA;VENTOLIN HFA) 108 (90 Base) MCG/ACT inhaler Inhale 2 puffs into the lungs every 4 (four) hours as needed for wheezing or shortness of breath (cough, shortness of breath or wheezing.). 1 Inhaler 1  . amLODipine (NORVASC) 10 MG tablet TAKE ONE TABLET BY MOUTH ONCE DAILY 90 tablet 3  . aspirin 325 MG tablet Take 325 mg by mouth daily.    [name] atorvastatin (LIPITOR) 20 MG tablet Take 1 tablet (20 mg total) by mouth daily. 90 tablet 1  . Cholecalciferol (VITAMIN D3) 5000 UNITS CAPS Take 1 capsule by mouth 2 (two) times a week.    . Cyanocobalamin (VITAMIN B 12 PO) Take 1 tablet by mouth daily.    [name] EPINEPHrine 0.3 mg/0.3 mL IJ SOAJ injection Inject 0.3 mLs (0.3 mg total) into the muscle once. 1 Device 0  . Guaifenesin (MUCINEX MAXIMUM STRENGTH) 1200 MG TB12 Take 1 tablet (1,200 mg total) by mouth every 12 (twelve) hours as needed. 14 tablet 1  . hydrochlorothiazide (MICROZIDE) 12.5 MG capsule Take 1 capsule (12.5 mg total) by mouth daily. As needed for swelling 90 capsule 3  . HYDROcodone-acetaminophen (NORCO) 7.5-325 MG tablet Take 1 tablet by mouth every 6 (six) hours as needed for moderate pain. 40 tablet 0  . hydroxypropyl methylcellulose / hypromellose (ISOPTO TEARS / GONIOVISC) 2.5 % ophthalmic solution Place 1 drop into both eyes at bedtime.    [name]  lansoprazole (PREVACID) 30 MG capsule Take 1 capsule (30 mg total) by mouth 2 (two) times daily before a meal. 60 capsule 11  . levothyroxine (SYNTHROID, LEVOTHROID) 125 MCG tablet TAKE ONE TABLET BY MOUTH ONCE DAILY BEFORE BREAKFAST. YEARLY PHYSICAL WITH LABS ARE DUE MUST SEE MD FOR REFILLS 30 tablet 1  . loratadine (CLARITIN) 10 MG tablet Take 10 mg by mouth daily as needed for allergies.    . Multiple Vitamin (MULTIVITAMIN WITH MINERALS) TABS tablet Take 1  tablet by mouth daily.    [name] azithromycin (ZITHROMAX) 250 MG tablet Take 2 tabs PO x 1 dose, then 1 tab PO QD x 4 days (Patient not taking: Reported on 07/16/2015) 6 tablet 0  . benzonatate (TESSALON) 100 MG capsule Take 1-2 capsules (100-200 mg total) by mouth 3 (three) times daily as needed for cough. (Patient not taking: Reported on 07/16/2015) 40 capsule 0    Physical Exam: Filed Vitals:   07/18/15 0535 07/18/15 1448  BP: 117/73 109/65  Pulse: 86 90  Temp: 97.4 F (36.3 C) 98.2 F (36.8 C)  Resp: 17 18   A+OX3 No sob/cp abd soft/nt ehl/ta/ga 5/5 bilaterally Sensation to LT intact Neg st leg raise Compartments soft/NT Intact DP/PT pulses No incontinence of B/B Back pain with palpation and ROM testing Able to stand and ambulate without significant difficulty  Image: Mr Lumbar Spine Wo Contrast  07/18/2015  CLINICAL DATA:  Progressive worsening low back and pelvic pain. Right sacroiliac tenderness. EXAM: MRI LUMBAR SPINE WITHOUT CONTRAST TECHNIQUE: Multiplanar, multisequence MR imaging of the lumbar spine was performed. No intravenous contrast was administered. COMPARISON:  CT 07/15/2015. FINDINGS: There is no abnormality of significance at L3-4 or above. The discs are normal. The canal and foramina are widely patent. The distal cord and conus are normal. There is mild facet hypertrophy on the left at T12-L1 and throughout the upper lumbar region but without advanced disease. L4-5: Desiccation and mild bulging of the disc. Mild facet hypertrophy. No compressive stenosis. Findings could be associated with back pain. L5-S1: No disc pathology. Mild facet degeneration and hypertrophy. No compressive stenosis. IMPRESSION: No significant degenerative disc disease. No stenosis of the canal or evidence of focal nerve compression. Facet degeneration and hypertrophy most pronounced at L4-5 and L5-S1 and to a lesser degree at L3-4 and above. This could be associated with low back pain or referred  facet syndrome pain. This does not appear to represent advanced disease however. Electronically Signed   By: Nelson Chimes M.D.   On: 07/18/2015 11:46   US Transvaginal Non-ob  07/16/2015  CLINICAL DATA:  Acute onset right lower quadrant abdominal pain. Initial encounter. EXAM: TRANSABDOMINAL AND TRANSVAGINAL ULTRASOUND OF PELVIS TECHNIQUE: Both transabdominal and transvaginal ultrasound examinations of the pelvis were performed. Transabdominal technique was performed for global imaging of the pelvis including uterus, ovaries, adnexal regions, and pelvic cul-de-sac. It was necessary to proceed with endovaginal exam following the transabdominal exam to visualize the ovaries in greater detail. COMPARISON:  CT of the abdomen and pelvis performed 07/15/2015 FINDINGS: Uterus Measurements: 9.2 x 4.7 x 6.9 cm. Anterior and posterior uterine fibroids are seen, measuring 5.4 cm and 5.0 cm in size. Endometrium Thickness: 0.8 cm.  No focal abnormality visualized. Right ovary Not visualized. Left ovary Not visualized. Other findings No free fluid is seen within the pelvic cul-de-sac. IMPRESSION: 1. The ovaries are not seen. No ancillary findings seen to suggest ovarian torsion. Evaluation is suboptimal due to patient's pelvic pain. 2. Large anterior and  posterior uterine fibroids, measuring 5.4 cm and 5.0 cm in size. Uterus otherwise unremarkable. Electronically Signed   By: Garald Balding M.D.   On: 07/16/2015 06:07   US Pelvis Complete  07/16/2015  CLINICAL DATA:  Acute onset right lower quadrant abdominal pain. Initial encounter. EXAM: TRANSABDOMINAL AND TRANSVAGINAL ULTRASOUND OF PELVIS TECHNIQUE: Both transabdominal and transvaginal ultrasound examinations of the pelvis were performed. Transabdominal technique was performed for global imaging of the pelvis including uterus, ovaries, adnexal regions, and pelvic cul-de-sac. It was necessary to proceed with endovaginal exam following the transabdominal exam to visualize  the ovaries in greater detail. COMPARISON:  CT of the abdomen and pelvis performed 07/15/2015 FINDINGS: Uterus Measurements: 9.2 x 4.7 x 6.9 cm. Anterior and posterior uterine fibroids are seen, measuring 5.4 cm and 5.0 cm in size. Endometrium Thickness: 0.8 cm.  No focal abnormality visualized. Right ovary Not visualized. Left ovary Not visualized. Other findings No free fluid is seen within the pelvic cul-de-sac. IMPRESSION: 1. The ovaries are not seen. No ancillary findings seen to suggest ovarian torsion. Evaluation is suboptimal due to patient's pelvic pain. 2. Large anterior and posterior uterine fibroids, measuring 5.4 cm and 5.0 cm in size. Uterus otherwise unremarkable. Electronically Signed   By: Garald Balding M.D.   On: 07/16/2015 06:07   Ct Abdomen Pelvis W Contrast  07/15/2015  CLINICAL DATA:  Right lower quadrant pain and nausea, worsening over the past 5 days. Chronic constipation. History of fibroids. Evaluation for appendicitis. EXAM: CT ABDOMEN AND PELVIS WITH CONTRAST TECHNIQUE: Multidetector CT imaging of the abdomen and pelvis was performed using the standard protocol following bolus administration of intravenous contrast. CONTRAST:  153mL OMNIPAQUE IOHEXOL 300 MG/ML  SOLN COMPARISON:  09/21/2014 FINDINGS: The visualized lung bases are free of consolidation or fusion. No focal liver lesion is identified. There is no biliary dilatation. The gallbladder, spleen, adrenal glands, kidneys, and pancreas are unremarkable. Oral contrast is present in multiple loops of nondilated small bowel. There is no evidence of bowel obstruction. The cecum is located in the right mid upper abdomen. The appendix is unremarkable. Multiple uterine masses are again seen, compatible with fibroids, measuring up to 6 cm in size, and not significantly changed from the prior study. Mild enlargement of both ovarian veins is unchanged. The bladder is largely decompressed. No free fluid is seen. Mildly prominent mesenteric  lymph nodes in the right abdomen are unchanged. No acute osseous abnormality is identified. IMPRESSION: 1. No acute abnormality identified in the abdomen or pelvis. Normal appendix. 2. Fibroid uterus. Electronically Signed   By: Logan Bores M.D.   On: 07/15/2015 16:16   [id number]: No significant degenerative disc disease. No stenosis of the canal or evidence of focal nerve compression.  Facet degeneration and hypertrophy most pronounced at L4-5 and L5-S1 and to a lesser degree at L3-4 and above. This could be associated with low back pain or referred facet syndrome pain. This does not appear to represent advanced disease however.  A/P: Patient with 1 week history of right lower abdominal and low back [pain with radiation into the right leg. MRI shows mild facet arthrosis at L3/4 4/5 but no stenosis od HNP No focal neurologic deficits seen on clinical exam No indication for surgical intervention. Recommend physical therapy, and pain medical management  Can consider facet blocks - discuss with interventional radiology Will sign off - no need for f/u with me.  Recommend she see PCP and pain medical management.  Agree with ob/gyn eval and possible  GI as well.

## 2015-07-18 NOTE — Progress Notes (Signed)
PROGRESS NOTE  Marilyn [name] [id number] DOB: May 09, 1966 DOA: 07/16/2015 PCP: Marilyn Cower, MD  HPI/Recap of past 24 hours:  C/o right sided pain, exacerbated by movement, seems to be better with current meds No pain at rest, pain is not associated with bm or urination, but she does has significant pain when trying to stand up from sitting on the toilet.  Assessment/Plan: Principal Problem:   RLQ abdominal pain Active Problems:   Hypothyroidism   Hyperlipidemia   GERD   HTN (hypertension)   Pain in lower back   Nausea & vomiting  Right sided pain, less likely ab pain, more likely related to musculoskeletal. i have reviewed CT ab /pel with radiologist over the phone, on obvious pathology on imaging except uterine fibroid. On physical exam, she does have significant tenderness over right SI joint and significant tenderness over right hip medially. Will try flexeril/prednisone/toradol,  get physical therapy. MRI with facet joint disease, no cord involvement. Ortho consulted, recommended facet block by IR , keep patient npo after midnight, IR consult in am on 2/14.   Uterine fibroid:  I have talked to [facility] Dr Marilyn [name] who advised patient to f/u with gyn outpatient. He states no need of inpatient consult.  HTN; stable on home meds  Hypothyroidism: tsh 7,  Synthroid dose adjustment, need to repeat tsh in 4weeks.  Hypokalemia:  k replaced, mag wnl.  H/o asthma: stable, lung clear    Code Status: full  Family Communication: patient , her daughter and son in room  Disposition Plan: home 2/14   Consultants:  Phone conversation with Dr Marilyn [name]  Phone conversation with Radiology Dr. [place] [name] to review CT ab/pel  Orthopedics Dr Rolena [name]  IR  Procedures:  none  Antibiotics:  none   Objective: BP 109/65 mmHg  Pulse 90  Temp(Src) 98.2 F (36.8 C) (Oral)  Resp 18  Ht 5\' 4"  (1.626 m)  Wt 90.719 kg (200 lb)  BMI 34.31 kg/m2  SpO2 98%  LMP  02/21/2013  Intake/Output Summary (Last 24 hours) at 07/18/15 1938 Last data filed at 07/18/15 1844  Gross per 24 hour  Intake   1060 ml  Output      0 ml  Net   1060 ml   Filed Weights   07/16/15 0409 07/16/15 1040  Weight: 90.833 kg (200 lb 4 oz) 90.719 kg (200 lb)    Exam:   General:  NAD  Cardiovascular: RRR  Respiratory: CTABL  Abdomen: Soft/ND/NT, positive BS  Musculoskeletal: No Edema, tenderness at right SI joint, tenderness right hip medially, not able to raise leg against resistance due to significant right hip pain  Neuro: aaox3  Data Reviewed: Basic Metabolic Panel:  Recent Labs Lab 07/15/15 1651 07/16/15 0436 07/16/15 0929 07/17/15 0551 07/18/15 0529  NA 137 138  --  141 136  K 3.6 3.4*  --  3.5 4.0  CL 101 103  --  105 102  CO2 28 24  --  27 23  GLUCOSE 93 134*  --  104* 159*  BUN 12 15  --  8 16  CREATININE 0.78 0.79 0.73 0.81 0.83  CALCIUM 9.0 8.7*  --  8.1* 8.8*  MG  --   --   --   --  2.2   Liver Function Tests:  Recent Labs Lab 07/16/15 0436  AST 22  ALT 19  ALKPHOS 70  BILITOT 0.2*  PROT 7.5  ALBUMIN 3.7    Recent Labs Lab 07/15/15 1651 07/16/15  0436  LIPASE 72.0* 28   No results for input(s): AMMONIA in the last 168 hours. CBC:  Recent Labs Lab 07/15/15 1703 07/16/15 0436 07/17/15 0551 07/18/15 0529  WBC 5.3 4.8 3.6* 6.8  NEUTROABS 2.7 2.4  --   --   HGB 12.6 12.0 11.4* 12.2  HCT 37.2 36.6 34.9* 37.2  MCV 81.0 81.2 82.5 81.2  PLT 262 231 216 253   Cardiac Enzymes:   No results for input(s): CKTOTAL, CKMB, CKMBINDEX, TROPONINI in the last 168 hours. BNP (last 3 results)  Recent Labs  09/28/14 1528  BNP 9.7    ProBNP (last 3 results) No results for input(s): PROBNP in the last 8760 hours.  CBG: No results for input(s): GLUCAP in the last 168 hours.  Recent Results (from the past 240 hour(s))  Urine culture     Status: None   Collection Time: 07/15/15  4:35 PM  Result Value Ref Range Status    Colony Count 40,000 COLONIES/ML  Final   Organism ID, Bacteria Multiple bacterial morphotypes present, none  Final   Organism ID, Bacteria predominant. Suggest appropriate recollection if   Final   Organism ID, Bacteria clinically indicated.  Final     Studies: Mr Lumbar Spine Wo Contrast  07/18/2015  CLINICAL DATA:  Progressive worsening low back and pelvic pain. Right sacroiliac tenderness. EXAM: MRI LUMBAR SPINE WITHOUT CONTRAST TECHNIQUE: Multiplanar, multisequence MR imaging of the lumbar spine was performed. No intravenous contrast was administered. COMPARISON:  CT 07/15/2015. FINDINGS: There is no abnormality of significance at L3-4 or above. The discs are normal. The canal and foramina are widely patent. The distal cord and conus are normal. There is mild facet hypertrophy on the left at T12-L1 and throughout the upper lumbar region but without advanced disease. L4-5: Desiccation and mild bulging of the disc. Mild facet hypertrophy. No compressive stenosis. Findings could be associated with back pain. L5-S1: No disc pathology. Mild facet degeneration and hypertrophy. No compressive stenosis. IMPRESSION: No significant degenerative disc disease. No stenosis of the canal or evidence of focal nerve compression. Facet degeneration and hypertrophy most pronounced at L4-5 and L5-S1 and to a lesser degree at L3-4 and above. This could be associated with low back pain or referred facet syndrome pain. This does not appear to represent advanced disease however. Electronically Signed   By: Marilyn [name] M.D.   On: 07/18/2015 11:46    Scheduled Meds: . amLODipine  10 mg Oral Daily  . aspirin  325 mg Oral Daily  . atorvastatin  20 mg Oral Daily  . docusate sodium  100 mg Oral BID  . enoxaparin (LOVENOX) injection  40 mg Subcutaneous Q24H  . hydrochlorothiazide  12.5 mg Oral Daily  . [START ON 07/19/2015] Influenza vac split quadrivalent PF  0.5 mL Intramuscular Tomorrow-1000  . ketorolac  7.5 mg  Intravenous 3 times per day  . levothyroxine  125 mcg Oral QAC breakfast  . pantoprazole  20 mg Oral Daily  . polyvinyl alcohol  1 drop Both Eyes QHS  . predniSONE  60 mg Oral Q breakfast  . senna  1 tablet Oral BID    Continuous Infusions:    Time spent: 60mins  Marilyn Lizak MD, PhD  [facility] Pager [phone]. If 7PM-7AM, please contact night-coverage at [email or web address], password [facility] 07/18/2015, 7:38 PM

## 2015-07-19 ENCOUNTER — Observation Stay (HOSPITAL_COMMUNITY): Payer: BC Managed Care – PPO

## 2015-07-19 DIAGNOSIS — R1031 Right lower quadrant pain: Secondary | ICD-10-CM | POA: Diagnosis present

## 2015-07-19 DIAGNOSIS — Z825 Family history of asthma and other chronic lower respiratory diseases: Secondary | ICD-10-CM | POA: Diagnosis not present

## 2015-07-19 DIAGNOSIS — D509 Iron deficiency anemia, unspecified: Secondary | ICD-10-CM | POA: Diagnosis present

## 2015-07-19 DIAGNOSIS — M47817 Spondylosis without myelopathy or radiculopathy, lumbosacral region: Secondary | ICD-10-CM | POA: Diagnosis present

## 2015-07-19 DIAGNOSIS — M47816 Spondylosis without myelopathy or radiculopathy, lumbar region: Secondary | ICD-10-CM | POA: Diagnosis present

## 2015-07-19 DIAGNOSIS — Z91013 Allergy to seafood: Secondary | ICD-10-CM | POA: Diagnosis not present

## 2015-07-19 DIAGNOSIS — D251 Intramural leiomyoma of uterus: Secondary | ICD-10-CM | POA: Diagnosis present

## 2015-07-19 DIAGNOSIS — E785 Hyperlipidemia, unspecified: Secondary | ICD-10-CM | POA: Diagnosis present

## 2015-07-19 DIAGNOSIS — I10 Essential (primary) hypertension: Secondary | ICD-10-CM | POA: Diagnosis not present

## 2015-07-19 DIAGNOSIS — E876 Hypokalemia: Secondary | ICD-10-CM | POA: Diagnosis present

## 2015-07-19 DIAGNOSIS — Z7982 Long term (current) use of aspirin: Secondary | ICD-10-CM | POA: Diagnosis not present

## 2015-07-19 DIAGNOSIS — Z87891 Personal history of nicotine dependence: Secondary | ICD-10-CM | POA: Diagnosis not present

## 2015-07-19 DIAGNOSIS — D259 Leiomyoma of uterus, unspecified: Secondary | ICD-10-CM | POA: Diagnosis not present

## 2015-07-19 DIAGNOSIS — Z885 Allergy status to narcotic agent status: Secondary | ICD-10-CM | POA: Diagnosis not present

## 2015-07-19 DIAGNOSIS — E038 Other specified hypothyroidism: Secondary | ICD-10-CM | POA: Diagnosis not present

## 2015-07-19 DIAGNOSIS — R112 Nausea with vomiting, unspecified: Secondary | ICD-10-CM | POA: Diagnosis not present

## 2015-07-19 DIAGNOSIS — K210 Gastro-esophageal reflux disease with esophagitis: Secondary | ICD-10-CM | POA: Diagnosis present

## 2015-07-19 DIAGNOSIS — Z23 Encounter for immunization: Secondary | ICD-10-CM | POA: Diagnosis not present

## 2015-07-19 DIAGNOSIS — Z888 Allergy status to other drugs, medicaments and biological substances status: Secondary | ICD-10-CM | POA: Diagnosis not present

## 2015-07-19 DIAGNOSIS — K449 Diaphragmatic hernia without obstruction or gangrene: Secondary | ICD-10-CM | POA: Diagnosis present

## 2015-07-19 DIAGNOSIS — T402X5A Adverse effect of other opioids, initial encounter: Secondary | ICD-10-CM | POA: Diagnosis not present

## 2015-07-19 DIAGNOSIS — J45909 Unspecified asthma, uncomplicated: Secondary | ICD-10-CM | POA: Diagnosis present

## 2015-07-19 DIAGNOSIS — Z79899 Other long term (current) drug therapy: Secondary | ICD-10-CM | POA: Diagnosis not present

## 2015-07-19 DIAGNOSIS — E039 Hypothyroidism, unspecified: Secondary | ICD-10-CM | POA: Diagnosis present

## 2015-07-19 DIAGNOSIS — M545 Low back pain: Secondary | ICD-10-CM | POA: Diagnosis not present

## 2015-07-19 LAB — BASIC METABOLIC PANEL
Anion gap: 12 (ref 5–15)
BUN: 13 mg/dL (ref 6–20)
CALCIUM: 8.9 mg/dL (ref 8.9–10.3)
CHLORIDE: 102 mmol/L (ref 101–111)
CO2: 27 mmol/L (ref 22–32)
CREATININE: 0.84 mg/dL (ref 0.44–1.00)
GFR calc Af Amer: >60 mL/min (ref >60)
Glucose, Bld: 105 mg/dL — ABNORMAL HIGH (ref 65–99)
Potassium: 3.7 mmol/L (ref 3.5–5.1)
SODIUM: 141 mmol/L (ref 135–145)

## 2015-07-19 LAB — T3, FREE: T3 FREE: 2.1 pg/mL (ref 2.0–4.4)

## 2015-07-19 LAB — MAGNESIUM: Magnesium: 2.3 mg/dL (ref 1.7–2.4)

## 2015-07-19 LAB — LACTIC ACID, PLASMA: LACTIC ACID, VENOUS: 1.6 mmol/L (ref 0.5–2.0)

## 2015-07-19 MED ORDER — IOHEXOL 180 MG/ML  SOLN
20.0000 mL | Freq: Once | INTRAMUSCULAR | Status: AC | PRN
  Administered 2015-07-19: 3 mL via INTRAVENOUS

## 2015-07-19 MED ORDER — METHYLPREDNISOLONE ACETATE 40 MG/ML IJ SUSP
INTRAMUSCULAR | Status: AC
  Administered 2015-07-19: 40 mg
  Filled 2015-07-19: qty 1

## 2015-07-19 MED ORDER — LIDOCAINE HCL (PF) 1 % IJ SOLN
INTRAMUSCULAR | Status: AC
  Administered 2015-07-19: 14:00:00
  Filled 2015-07-19: qty 5

## 2015-07-19 MED ORDER — LIDOCAINE HCL (PF) 1 % IJ SOLN
INTRAMUSCULAR | Status: AC
  Filled 2015-07-19: qty 5

## 2015-07-19 MED ORDER — METHYLPREDNISOLONE ACETATE 80 MG/ML IJ SUSP
INTRAMUSCULAR | Status: AC
  Administered 2015-07-19: 80 mg
  Filled 2015-07-19: qty 1

## 2015-07-19 MED ORDER — SODIUM CHLORIDE 0.9 % IJ SOLN
INTRAMUSCULAR | Status: AC
  Filled 2015-07-19: qty 10

## 2015-07-19 MED ORDER — MAGNESIUM SULFATE 2 GM/50ML IV SOLN
2.0000 g | Freq: Once | INTRAVENOUS | Status: AC
  Administered 2015-07-19: 2 g via INTRAVENOUS
  Filled 2015-07-19: qty 50

## 2015-07-19 NOTE — Progress Notes (Signed)
Referring Physician(s): Dr Florencia Reasons Dr Rolena Infante  Chief Complaint:  Rt sided hip and leg pain radiating to Rt knee  Subjective:  Denies injury Pain has been worsening since onset 1 week ago Sitting on couch the sudden Rt hip/back pain Progressed now to Rt hip/leg to knee pain- - severe MRI: IMPRESSION: No significant degenerative disc disease. No stenosis of the canal or evidence of focal nerve compression.  Facet degeneration and hypertrophy most pronounced at L4-5 and L5-S1 and to a lesser degree at L3-4 and above. This could be associated with low back pain or referred facet syndrome pain. This does not appear to represent advanced disease however.  Denies any blood thinner use  Allergies: Tuna; Codeine; and Flonase  Medications: Prior to Admission medications   Medication Sig Start Date End Date Taking? Authorizing Provider  acetaminophen (TYLENOL) 325 MG tablet Take 325 mg by mouth every 6 (six) hours as needed for moderate pain.   Yes Historical Provider, MD  albuterol (PROVENTIL HFA;VENTOLIN HFA) 108 (90 Base) MCG/ACT inhaler Inhale 2 puffs into the lungs every 4 (four) hours as needed for wheezing or shortness of breath (cough, shortness of breath or wheezing.). 06/26/15  Yes Stephanie D English, PA  amLODipine (NORVASC) 10 MG tablet TAKE ONE TABLET BY MOUTH ONCE DAILY 07/20/14  Yes Biagio Borg, MD  aspirin 325 MG tablet Take 325 mg by mouth daily.   Yes Historical Provider, MD  atorvastatin (LIPITOR) 20 MG tablet Take 1 tablet (20 mg total) by mouth daily. 04/15/15 04/14/16 Yes Biagio Borg, MD  Cholecalciferol (VITAMIN D3) 5000 UNITS CAPS Take 1 capsule by mouth 2 (two) times a week.   Yes Historical Provider, MD  Cyanocobalamin (VITAMIN B 12 PO) Take 1 tablet by mouth daily.   Yes Historical Provider, MD  EPINEPHrine 0.3 mg/0.3 mL IJ SOAJ injection Inject 0.3 mLs (0.3 mg total) into the muscle once. 11/10/14  Yes Everlene Balls, MD  Guaifenesin (MUCINEX MAXIMUM  STRENGTH) 1200 MG TB12 Take 1 tablet (1,200 mg total) by mouth every 12 (twelve) hours as needed. 06/26/15  Yes Stephanie D English, PA  hydrochlorothiazide (MICROZIDE) 12.5 MG capsule Take 1 capsule (12.5 mg total) by mouth daily. As needed for swelling 04/07/14  Yes Biagio Borg, MD  HYDROcodone-acetaminophen [facility]) 7.5-325 MG tablet Take 1 tablet by mouth every 6 (six) hours as needed for moderate pain. 07/15/15  Yes Biagio Borg, MD  hydroxypropyl methylcellulose / hypromellose (ISOPTO TEARS / GONIOVISC) 2.5 % ophthalmic solution Place 1 drop into both eyes at bedtime.   Yes Historical Provider, MD  lansoprazole (PREVACID) 30 MG capsule Take 1 capsule (30 mg total) by mouth 2 (two) times daily before a meal. 02/22/15  Yes Ladene Artist, MD  levothyroxine (SYNTHROID, LEVOTHROID) 125 MCG tablet TAKE ONE TABLET BY MOUTH ONCE DAILY BEFORE BREAKFAST. YEARLY PHYSICAL WITH LABS ARE DUE MUST SEE MD FOR REFILLS 06/15/15  Yes Biagio Borg, MD  loratadine (CLARITIN) 10 MG tablet Take 10 mg by mouth daily as needed for allergies.   Yes Historical Provider, MD  Multiple Vitamin (MULTIVITAMIN WITH MINERALS) TABS tablet Take 1 tablet by mouth daily.   Yes Historical Provider, MD  azithromycin (ZITHROMAX) 250 MG tablet Take 2 tabs PO x 1 dose, then 1 tab PO QD x 4 days Patient not taking: Reported on 07/16/2015 06/26/15   Dorian Heckle English, PA  benzonatate (TESSALON) 100 MG capsule Take 1-2 capsules (100-200 mg total) by mouth 3 (three) times  daily as needed for cough. Patient not taking: Reported on 07/16/2015 06/26/15   Dorian Heckle English, PA     Vital Signs: BP 166/105 mmHg  Pulse 115  Temp(Src) 97.9 F (36.6 C) (Oral)  Resp 20  Ht 5\' 4"  (1.626 m)  Wt 200 lb (90.719 kg)  BMI 34.31 kg/m2  SpO2 100%  LMP 02/21/2013  Physical Exam  Constitutional: She is oriented to person, place, and time.  Musculoskeletal: Normal range of motion. She exhibits tenderness.  Rt hip'/leg pain Anterior R thigh  pain Tender to palpate these areas No sign of injury   Neurological: She is alert and oriented to person, place, and time.  Skin: Skin is warm and dry.  Psychiatric: She has a normal mood and affect. Her behavior is normal. Judgment and thought content normal.  Nursing note and vitals reviewed.   Imaging: Mr Lumbar Spine Wo Contrast  07/18/2015  CLINICAL DATA:  Progressive worsening low back and pelvic pain. Right sacroiliac tenderness. EXAM: MRI LUMBAR SPINE WITHOUT CONTRAST TECHNIQUE: Multiplanar, multisequence MR imaging of the lumbar spine was performed. No intravenous contrast was administered. COMPARISON:  CT 07/15/2015. FINDINGS: There is no abnormality of significance at L3-4 or above. The discs are normal. The canal and foramina are widely patent. The distal cord and conus are normal. There is mild facet hypertrophy on the left at T12-L1 and throughout the upper lumbar region but without advanced disease. L4-5: Desiccation and mild bulging of the disc. Mild facet hypertrophy. No compressive stenosis. Findings could be associated with back pain. L5-S1: No disc pathology. Mild facet degeneration and hypertrophy. No compressive stenosis. IMPRESSION: No significant degenerative disc disease. No stenosis of the canal or evidence of focal nerve compression. Facet degeneration and hypertrophy most pronounced at L4-5 and L5-S1 and to a lesser degree at L3-4 and above. This could be associated with low back pain or referred facet syndrome pain. This does not appear to represent advanced disease however. Electronically Signed   By: Nelson Chimes M.D.   On: 07/18/2015 11:46   US Transvaginal Non-ob  07/16/2015  CLINICAL DATA:  Acute onset right lower quadrant abdominal pain. Initial encounter. EXAM: TRANSABDOMINAL AND TRANSVAGINAL ULTRASOUND OF PELVIS TECHNIQUE: Both transabdominal and transvaginal ultrasound examinations of the pelvis were performed. Transabdominal technique was performed for global  imaging of the pelvis including uterus, ovaries, adnexal regions, and pelvic cul-de-sac. It was necessary to proceed with endovaginal exam following the transabdominal exam to visualize the ovaries in greater detail. COMPARISON:  CT of the abdomen and pelvis performed 07/15/2015 FINDINGS: Uterus Measurements: 9.2 x 4.7 x 6.9 cm. Anterior and posterior uterine fibroids are seen, measuring 5.4 cm and 5.0 cm in size. Endometrium Thickness: 0.8 cm.  No focal abnormality visualized. Right ovary Not visualized. Left ovary Not visualized. Other findings No free fluid is seen within the pelvic cul-de-sac. IMPRESSION: 1. The ovaries are not seen. No ancillary findings seen to suggest ovarian torsion. Evaluation is suboptimal due to patient's pelvic pain. 2. Large anterior and posterior uterine fibroids, measuring 5.4 cm and 5.0 cm in size. Uterus otherwise unremarkable. Electronically Signed   By: Garald Balding M.D.   On: 07/16/2015 06:07   US Pelvis Complete  07/16/2015  CLINICAL DATA:  Acute onset right lower quadrant abdominal pain. Initial encounter. EXAM: TRANSABDOMINAL AND TRANSVAGINAL ULTRASOUND OF PELVIS TECHNIQUE: Both transabdominal and transvaginal ultrasound examinations of the pelvis were performed. Transabdominal technique was performed for global imaging of the pelvis including uterus, ovaries, adnexal regions, and pelvic  cul-de-sac. It was necessary to proceed with endovaginal exam following the transabdominal exam to visualize the ovaries in greater detail. COMPARISON:  CT of the abdomen and pelvis performed 07/15/2015 FINDINGS: Uterus Measurements: 9.2 x 4.7 x 6.9 cm. Anterior and posterior uterine fibroids are seen, measuring 5.4 cm and 5.0 cm in size. Endometrium Thickness: 0.8 cm.  No focal abnormality visualized. Right ovary Not visualized. Left ovary Not visualized. Other findings No free fluid is seen within the pelvic cul-de-sac. IMPRESSION: 1. The ovaries are not seen. No ancillary findings seen  to suggest ovarian torsion. Evaluation is suboptimal due to patient's pelvic pain. 2. Large anterior and posterior uterine fibroids, measuring 5.4 cm and 5.0 cm in size. Uterus otherwise unremarkable. Electronically Signed   By: Garald Balding M.D.   On: 07/16/2015 06:07   Ct Abdomen Pelvis W Contrast  07/15/2015  CLINICAL DATA:  Right lower quadrant pain and nausea, worsening over the past 5 days. Chronic constipation. History of fibroids. Evaluation for appendicitis. EXAM: CT ABDOMEN AND PELVIS WITH CONTRAST TECHNIQUE: Multidetector CT imaging of the abdomen and pelvis was performed using the standard protocol following bolus administration of intravenous contrast. CONTRAST:  146mL OMNIPAQUE IOHEXOL 300 MG/ML  SOLN COMPARISON:  09/21/2014 FINDINGS: The visualized lung bases are free of consolidation or fusion. No focal liver lesion is identified. There is no biliary dilatation. The gallbladder, spleen, adrenal glands, kidneys, and pancreas are unremarkable. Oral contrast is present in multiple loops of nondilated small bowel. There is no evidence of bowel obstruction. The cecum is located in the right mid upper abdomen. The appendix is unremarkable. Multiple uterine masses are again seen, compatible with fibroids, measuring up to 6 cm in size, and not significantly changed from the prior study. Mild enlargement of both ovarian veins is unchanged. The bladder is largely decompressed. No free fluid is seen. Mildly prominent mesenteric lymph nodes in the right abdomen are unchanged. No acute osseous abnormality is identified. IMPRESSION: 1. No acute abnormality identified in the abdomen or pelvis. Normal appendix. 2. Fibroid uterus. Electronically Signed   By: Logan Bores M.D.   On: 07/15/2015 16:16    Labs:  CBC:  Recent Labs  07/15/15 1703 07/16/15 0436 07/17/15 0551 07/18/15 0529  WBC 5.3 4.8 3.6* 6.8  HGB 12.6 12.0 11.4* 12.2  HCT 37.2 36.6 34.9* 37.2  PLT 262 231 216 253    COAGS: No  results for input(s): INR, APTT in the last 8760 hours.  BMP:  Recent Labs  07/16/15 0436 07/16/15 0929 07/17/15 0551 07/18/15 0529 07/19/15 0952  NA 138  --  141 136 141  K 3.4*  --  3.5 4.0 3.7  CL 103  --  105 102 102  CO2 24  --  27 23 27   GLUCOSE 134*  --  104* 159* 105*  BUN 15  --  8 16 13   CALCIUM 8.7*  --  8.1* 8.8* 8.9  CREATININE 0.79 0.73 0.81 0.83 0.84  GFRNONAA >60 >60 >60 >60 >60  GFRAA >60 >60 >60 >60 >60    LIVER FUNCTION TESTS:  Recent Labs  09/21/14 2051 06/03/15 1416 07/16/15 0436  BILITOT 0.3 0.40 0.2*  AST 27 21 22   ALT 31 21 19   ALKPHOS 88 82 70  PROT 8.9* 8.2 7.5  ALBUMIN 4.5 4.0 3.7    Assessment and Plan:  Rt hip/leg to knee pain Severe and not controlled well with medication MRI does show facet degeneration and hypertrophy Now scheduled for lumbar facet joint  steroid injection Pt aware of risks and benefits including but not limited to Infection; damage to surrounding structures Agreeable to proceed Consent signed andin chart  Electronically Signed: Dajiah Kooi A 07/19/2015, 10:53 AM   I spent a total of 15 Minutes at the the patient's bedside AND on the patient's hospital floor or unit, greater than 50% of which was counseling/coordinating care for Lumbar facet joint steroid injection

## 2015-07-19 NOTE — Progress Notes (Signed)
Physical Therapy Treatment Patient Details Name: Marilyn [name] MRN: [id number] DOB: [birth year] Today's Date: 07/19/2015    History of Present Illness Marilyn [name] is a 50 y.o. female with a pmh anemia, uterine fibroids, GERD, and hypothyroidism; who presents with progressively worsening right lower quadrant abdominal/ suprapubic pain over the last week. Imaging showing uterine fibroids; Labs neg, imaging showing uterine fibroids, musculoskeletal pain possible with facet arthropathy noted on imaging; tender at R SI joint    PT Comments    Session focused on strategies to help still participate in functional activities despite persistent considerable pain; Discussed back precautions and rationale/hope that they will help with making functional mobility less painful; Notable that despite pain and apparent emotional pain as well, Marilyn [name] was able to still walk and her steps were smoother and less guarded than previous sessions  Asked Spiritual Care office for [place] visit.  Follow Up Recommendations  Outpatient PT     Equipment Recommendations  Other (comment) (no significant need for RW)    Recommendations for Other Services       Precautions / Restrictions Restrictions Weight Bearing Restrictions: No    Mobility  Bed Mobility Overal bed mobility: Needs Assistance Bed Mobility: Rolling;Sidelying to Sit Rolling: Supervision Sidelying to sit: Supervision       General bed mobility comments: Discussed back precautions and rationale/hope that they will help with making functional mobility less painful; performed log roll well  Transfers Overall transfer level: Modified independent Equipment used: None Transfers: Sit to/from Stand Sit to Stand: Modified independent (Device/Increase time)         General transfer comment: slow moving and painful, but not needing assist  Ambulation/Gait Ambulation/Gait assistance: Supervision;Modified independent  (Device/Increase time) Ambulation Distance (Feet): 100 Feet Assistive device: None Gait Pattern/deviations: Decreased step length - right;Decreased step length - left;Decreased stride length Gait velocity: slow   General Gait Details: Continued slow, guarded, and antalgic gait; choosing not to use RW; noted smoother steps with increased step length and stride length compared to the last 2 days   Stairs            Wheelchair Mobility    Modified Rankin (Stroke Patients Only)       Balance                                    Cognition Arousal/Alertness: Awake/alert Behavior During Therapy: WFL for tasks assessed/performed (Tearful during session) Overall Cognitive Status: Within Functional Limits for tasks assessed                      Exercises      General Comments General comments (skin integrity, edema, etc.): Very tearful during session; Notified Spiritual Care office that Marilyn [name] would be willing to talk with a chaplain      Pertinent Vitals/Pain Pain Assessment: Faces Faces Pain Scale: Hurts worst Pain Location: R SI joint area and medial to hip anteriorly; worsens with transition from sit to stand, and with weight shifts onto RLE during amb; lessens at rest; She awoke in pain last night, pain medications helped Pain Descriptors / Indicators: Aching;Cramping;Guarding;Crying Pain Intervention(s): Monitored during session    Home Living                      Prior Function            PT Goals (current goals  can now be found in the care plan section) Acute Rehab PT Goals Patient Stated Goal: less pain and return to work PT Goal Formulation: With patient Time For Goal Achievement: 07/24/15 Potential to Achieve Goals: Good Progress towards PT goals: Progressing toward goals    Frequency  Min 3X/week    PT Plan Current plan remains appropriate    Co-evaluation             End of Session   Activity Tolerance:  Patient tolerated treatment well Patient left: in chair;with call bell/phone within reach     Time: 0928-0952 PT Time Calculation (min) (ACUTE ONLY): 24 min  Charges:  $Gait Training: 8-22 mins $Therapeutic Activity: 8-22 mins                    G Codes:      Quin Hoop 07/19/2015, 10:27 AM  Roney [place], [place] Pager [phone] Office [phone]

## 2015-07-19 NOTE — Progress Notes (Addendum)
PROGRESS NOTE  Marilyn [name] [id number] DOB: [birth year] DOA: 07/16/2015 PCP: [name], MD  HPI/Recap of past 24 hours:  C/o right sided pain, exacerbated by movement, but also has intermittent pain when she does not move, reported pain woke her up last night, overall she reports pain did not get better with current regimen, she is crying due to pain, she was able to ambulate with physical therapy this am. Patient reports pain is not associated with bm or urination, but she does has significant pain when trying to stand up from sitting [position on the toilet.  Assessment/Plan: Principal Problem:   RLQ abdominal pain Active Problems:   Hypothyroidism   Hyperlipidemia   GERD   HTN (hypertension)   Pain in lower back   Nausea & vomiting  Right sided pain, musculoskeletal? Uterine fibroid cramping? GI ? CT ab/pel did not review any GI pathology, patient reported no problem with BM or urination. Lactic acid wnl , less likely ischemic pain. ? musculoskeletal.  On physical exam, she does have significant tenderness over right SI joint and significant tenderness over right hip medially. started flexeril/prednisone/toradol and physical therapy on 2/12 MRI lumber spine with facet joint disease, no disc or cord involvement. Ortho Dr. Rolena Infante consulted, recommended facet block by IR ,  IR consulted on 2/14.   Uterine fibroid:  I have talked to [facility] Dr Ouida Sills on 2/12 who advised patient to f/u with gyn outpatient. He states no need of inpatient consult. Due to persistent pain, patient reported the pain is cramping in nature, she want to get GYN opinion again, I have talked to [place] on 2/14 over the phone , she recommends uterine artery embolization by IR, I have talked to IR Dr Pascal Lux, he recommends to start with facet joint injection first , if patient 's pain does not improve with facet joint injection, he recommends mri pelvic with contrast to further eval for uterine  fibroids, IR recommends formal gyn consult and recommendation for uterine artery embolization.  Will try iv mag for smooth muscle relaxation.    HTN; stable on home meds  Hypothyroidism: tsh 7,  Synthroid dose adjustment, need to repeat tsh in 4weeks.  Hypokalemia:  k replaced, mag wnl.  H/o asthma: stable, lung clear    Code Status: full  Family Communication: patient , her daughter and son in room  Disposition Plan: home when pain is under control   Consultants:  Phone conversation with Dr Ouida Sills on 2/12 and with Dr Philis Pique on 2/14  Phone conversation with Radiology Dr. [place] Pink to review CT ab/pel  Orthopedics Dr Rolena Infante  IR phone conversation with Dr watts and Dr Jobe Igo on 2/14  Procedures:  Facet joint injection on 2/14  Antibiotics:  none   Objective: BP 166/105 mmHg  Pulse 115  Temp(Src) 97.9 F (36.6 C) (Oral)  Resp 20  Ht 5\' 4"  (1.626 m)  Wt 90.719 kg (200 lb)  BMI 34.31 kg/m2  SpO2 100%  LMP 02/21/2013  Intake/Output Summary (Last 24 hours) at 07/19/15 1019 Last data filed at 07/19/15 1002  Gross per 24 hour  Intake    960 ml  Output      0 ml  Net    960 ml   Filed Weights   07/16/15 0409 07/16/15 1040  Weight: 90.833 kg (200 lb 4 oz) 90.719 kg (200 lb)    Exam:   General:  NAD  Cardiovascular: RRR  Respiratory: CTABL  Abdomen: Soft/ND/NT, positive BS  Musculoskeletal: No Edema, tenderness at right SI joint, tenderness right hip medially, not able to raise leg against resistance due to significant right hip pain  Neuro: aaox3  Data Reviewed: Basic Metabolic Panel:  Recent Labs Lab 07/15/15 1651 07/16/15 0436 07/16/15 0929 07/17/15 0551 07/18/15 0529  NA 137 138  --  141 136  K 3.6 3.4*  --  3.5 4.0  CL 101 103  --  105 102  CO2 28 24  --  27 23  GLUCOSE 93 134*  --  104* 159*  BUN 12 15  --  8 16  CREATININE 0.78 0.79 0.73 0.81 0.83  CALCIUM 9.0 8.7*  --  8.1* 8.8*  MG  --   --   --   --  2.2   Liver  Function Tests:  Recent Labs Lab 07/16/15 0436  AST 22  ALT 19  ALKPHOS 70  BILITOT 0.2*  PROT 7.5  ALBUMIN 3.7    Recent Labs Lab 07/15/15 1651 07/16/15 0436  LIPASE 72.0* 28   No results for input(s): AMMONIA in the last 168 hours. CBC:  Recent Labs Lab 07/15/15 1703 07/16/15 0436 07/17/15 0551 07/18/15 0529  WBC 5.3 4.8 3.6* 6.8  NEUTROABS 2.7 2.4  --   --   HGB 12.6 12.0 11.4* 12.2  HCT 37.2 36.6 34.9* 37.2  MCV 81.0 81.2 82.5 81.2  PLT 262 231 216 253   Cardiac Enzymes:   No results for input(s): CKTOTAL, CKMB, CKMBINDEX, TROPONINI in the last 168 hours. BNP (last 3 results)  Recent Labs  09/28/14 1528  BNP 9.7    ProBNP (last 3 results) No results for input(s): PROBNP in the last 8760 hours.  CBG: No results for input(s): GLUCAP in the last 168 hours.  Recent Results (from the past 240 hour(s))  Urine culture     Status: None   Collection Time: 07/15/15  4:35 PM  Result Value Ref Range Status   Colony Count 40,000 COLONIES/ML  Final   Organism ID, Bacteria Multiple bacterial morphotypes present, none  Final   Organism ID, Bacteria predominant. Suggest appropriate recollection if   Final   Organism ID, Bacteria clinically indicated.  Final     Studies: Mr Lumbar Spine Wo Contrast  07/18/2015  CLINICAL DATA:  Progressive worsening low back and pelvic pain. Right sacroiliac tenderness. EXAM: MRI LUMBAR SPINE WITHOUT CONTRAST TECHNIQUE: Multiplanar, multisequence MR imaging of the lumbar spine was performed. No intravenous contrast was administered. COMPARISON:  CT 07/15/2015. FINDINGS: There is no abnormality of significance at L3-4 or above. The discs are normal. The canal and foramina are widely patent. The distal cord and conus are normal. There is mild facet hypertrophy on the left at T12-L1 and throughout the upper lumbar region but without advanced disease. L4-5: Desiccation and mild bulging of the disc. Mild facet hypertrophy. No compressive  stenosis. Findings could be associated with back pain. L5-S1: No disc pathology. Mild facet degeneration and hypertrophy. No compressive stenosis. IMPRESSION: No significant degenerative disc disease. No stenosis of the canal or evidence of focal nerve compression. Facet degeneration and hypertrophy most pronounced at L4-5 and L5-S1 and to a lesser degree at L3-4 and above. This could be associated with low back pain or referred facet syndrome pain. This does not appear to represent advanced disease however. Electronically Signed   By: Nelson Chimes M.D.   On: 07/18/2015 11:46    Scheduled Meds: . amLODipine  10 mg Oral Daily  . aspirin  325  mg Oral Daily  . atorvastatin  20 mg Oral Daily  . docusate sodium  100 mg Oral BID  . enoxaparin (LOVENOX) injection  40 mg Subcutaneous Q24H  . hydrochlorothiazide  12.5 mg Oral Daily  . Influenza vac split quadrivalent PF  0.5 mL Intramuscular Tomorrow-1000  . ketorolac  7.5 mg Intravenous 3 times per day  . levothyroxine  125 mcg Oral QAC breakfast  . magnesium sulfate 1 - 4 g bolus IVPB  2 g Intravenous Once  . pantoprazole  20 mg Oral Daily  . polyvinyl alcohol  1 drop Both Eyes QHS  . predniSONE  60 mg Oral Q breakfast  . senna  1 tablet Oral BID    Continuous Infusions:    Time spent: 63mins, more than 50% time spent on coordination of care  Zackari Ruane MD, PhD  [facility] Pager [phone]. If 7PM-7AM, please contact night-coverage at [email or web address], password [facility] 07/19/2015, 10:19 AM

## 2015-07-20 ENCOUNTER — Inpatient Hospital Stay (HOSPITAL_COMMUNITY): Payer: BC Managed Care – PPO

## 2015-07-20 DIAGNOSIS — R1031 Right lower quadrant pain: Secondary | ICD-10-CM

## 2015-07-20 LAB — CBC WITH DIFFERENTIAL/PLATELET
BASOS PCT: 0 %
Basophils Absolute: 0 10*3/uL (ref 0.0–0.1)
EOS PCT: 0 %
Eosinophils Absolute: 0 10*3/uL (ref 0.0–0.7)
HEMATOCRIT: 37.1 % (ref 36.0–46.0)
Hemoglobin: 12.4 g/dL (ref 12.0–15.0)
LYMPHS PCT: 17 %
Lymphs Abs: 1.6 10*3/uL (ref 0.7–4.0)
MCH: 27.4 pg (ref 26.0–34.0)
MCHC: 33.4 g/dL (ref 30.0–36.0)
MCV: 82.1 fL (ref 78.0–100.0)
MONO ABS: 0.5 10*3/uL (ref 0.1–1.0)
MONOS PCT: 5 %
NEUTROS ABS: 7.3 10*3/uL (ref 1.7–7.7)
Neutrophils Relative %: 78 %
PLATELETS: 244 10*3/uL (ref 150–400)
RBC: 4.52 MIL/uL (ref 3.87–5.11)
RDW: 13 % (ref 11.5–15.5)
WBC: 9.4 10*3/uL (ref 4.0–10.5)

## 2015-07-20 LAB — BASIC METABOLIC PANEL
Anion gap: 10 (ref 5–15)
BUN: 19 mg/dL (ref 6–20)
CALCIUM: 8.3 mg/dL — AB (ref 8.9–10.3)
CO2: 27 mmol/L (ref 22–32)
CREATININE: 1.01 mg/dL — AB (ref 0.44–1.00)
Chloride: 101 mmol/L (ref 101–111)
GFR calc Af Amer: >60 mL/min (ref >60)
GFR calc non Af Amer: >60 mL/min (ref >60)
GLUCOSE: 154 mg/dL — AB (ref 65–99)
Potassium: 3.8 mmol/L (ref 3.5–5.1)
Sodium: 138 mmol/L (ref 135–145)

## 2015-07-20 MED ORDER — OXYCODONE-ACETAMINOPHEN 7.5-325 MG PO TABS
2.0000 | ORAL_TABLET | ORAL | Status: DC | PRN
  Administered 2015-07-20 – 2015-07-21 (×3): 2 via ORAL
  Filled 2015-07-20 (×3): qty 2

## 2015-07-20 MED ORDER — GADOBENATE DIMEGLUMINE 529 MG/ML IV SOLN
19.0000 mL | Freq: Once | INTRAVENOUS | Status: AC | PRN
  Administered 2015-07-20: 19 mL via INTRAVENOUS

## 2015-07-20 NOTE — Progress Notes (Signed)
PROGRESS NOTE  LIAH STARBUCK [id number] DOB: 08/10/1965 DOA: 07/16/2015 PCP: [name], MD  HPI/Recap of past 59 hours:  50 year old female Known history of reflux, Anemia Hypothyroidism Admitted with flank pain and discomfort as well as right lower quadrant pain concerning for intra-abdominal pathology Anterior and posterior shin fibroids noted 5.4 x 5 cm  Because of ongoing pain gynecology was consulted who declined to see the patient on inpatient setting IR was consulted because of back pain and patient was started on Flexeril Toradol prednisone and IR performed facet block     Assessment/Plan: Principal Problem:   RLQ abdominal pain Active Problems:   Hypothyroidism   Hyperlipidemia   GERD   HTN (hypertension)   Pain in lower back   Nausea & vomiting   RUQ abdominal pain  Right sided pain, musculoskeletal? Uterine fibroid cramping? GI ? CT ab/pel did not review any GI pathology, patient reported no problem with BM or urination. Lactic acid wnl , less likely ischemic pain. ? musculoskeletal.  On physical exam, she does have significant tenderness over right SI joint and significant tenderness over right hip medially. started flexeril/prednisone/toradol and physical therapy on 2/12 MRI lumber spine with facet joint disease, no disc or cord involvement. Ortho Dr. Rolena Infante consulted, recommended facet block by IR ,  IR consulted on 2/14 and performed facet block which has given mild to moderate relief only  Uterine fibroid: Dr. Erlinda Hong discussed with [facility] Dr Ouida Sills on 2/12 who advised patient to f/u with gyn outpatient. He states no need of inpatient consult. Due to persistent pain, patient reported the pain is cramping in nature, she want to get GYN opinion again, Dr. Erlinda Hong discussed with GYN Philis Pique on 2/14 over the phone , she recommends uterine artery embolization by IR, IR recommends formal gyn consult and recommendation for uterine artery embolization.    MRI pelvis is pending rule out any other focal findings intra-abdominal Will try iv mag for smooth muscle relaxation.    HTN; stable on home meds  Hypothyroidism: tsh 7,  Synthroid dose adjustment, need to repeat tsh in 4weeks.  Hypokalemia:  k replaced, mag wnl.  H/o asthma: stable, lung clear    Code Status: full  Family Communication: patient , her daughter and son in room  Disposition Plan: home when pain is under control   Consultants:  Phone conversation with Dr Ouida Sills on 2/12 and with Dr Philis Pique on 2/14  Phone conversation with Radiology Dr. [place] Pink to review CT ab/pel  Orthopedics Dr Rolena Infante  IR phone conversation with Dr watts and Dr Jobe Igo on 2/14  Procedures:  Facet joint injection on 2/14  Antibiotics:  none   Objective: BP 129/68 mmHg  Pulse 84  Temp(Src) 97.4 F (36.3 C) (Oral)  Resp 20  Ht 5\' 4"  (1.626 m)  Wt 90.719 kg (200 lb)  BMI 34.31 kg/m2  SpO2 100%  LMP 02/21/2013  Intake/Output Summary (Last 24 hours) at 07/20/15 1431 Last data filed at 07/20/15 1300  Gross per 24 hour  Intake    870 ml  Output    400 ml  Net    470 ml   Filed Weights   07/16/15 0409 07/16/15 1040  Weight: 90.833 kg (200 lb 4 oz) 90.719 kg (200 lb)    Exam:   General:  NAD  Cardiovascular: RRR  Respiratory: CTABL  Abdomen: Soft/ND/NT, positive BS.  Significant pain in the abdomen in RUQ  Musculoskeletal: No Edema, tenderness at right SI joint, tenderness right  hip medially, not able to raise leg against resistance due to significant right hip pain  Neuro: aaox3  Data Reviewed: Basic Metabolic Panel:  Recent Labs Lab 07/16/15 0436 07/16/15 0929 07/17/15 0551 07/18/15 0529 07/19/15 0952 07/20/15 1006  NA 138  --  141 136 141 138  K 3.4*  --  3.5 4.0 3.7 3.8  CL 103  --  105 102 102 101  CO2 24  --  27 23 27 27   GLUCOSE 134*  --  104* 159* 105* 154*  BUN 15  --  8 16 13 19   CREATININE 0.79 0.73 0.81 0.83 0.84 1.01*  CALCIUM 8.7*   --  8.1* 8.8* 8.9 8.3*  MG  --   --   --  2.2 2.3  --    Liver Function Tests:  Recent Labs Lab 07/16/15 0436  AST 22  ALT 19  ALKPHOS 70  BILITOT 0.2*  PROT 7.5  ALBUMIN 3.7    Recent Labs Lab 07/15/15 1651 07/16/15 0436  LIPASE 72.0* 28   No results for input(s): AMMONIA in the last 168 hours. CBC:  Recent Labs Lab 07/15/15 1703 07/16/15 0436 07/17/15 0551 07/18/15 0529 07/20/15 1006  WBC 5.3 4.8 3.6* 6.8 9.4  NEUTROABS 2.7 2.4  --   --  7.3  HGB 12.6 12.0 11.4* 12.2 12.4  HCT 37.2 36.6 34.9* 37.2 37.1  MCV 81.0 81.2 82.5 81.2 82.1  PLT 262 231 216 253 244   Cardiac Enzymes:   No results for input(s): CKTOTAL, CKMB, CKMBINDEX, TROPONINI in the last 168 hours. BNP (last 3 results)  Recent Labs  09/28/14 1528  BNP 9.7    ProBNP (last 3 results) No results for input(s): PROBNP in the last 8760 hours.  CBG: No results for input(s): GLUCAP in the last 168 hours.  Recent Results (from the past 240 hour(s))  Urine culture     Status: None   Collection Time: 07/15/15  4:35 PM  Result Value Ref Range Status   Colony Count 40,000 COLONIES/ML  Final   Organism ID, Bacteria Multiple bacterial morphotypes present, none  Final   Organism ID, Bacteria predominant. Suggest appropriate recollection if   Final   Organism ID, Bacteria clinically indicated.  Final     Studies: No results found.  Scheduled Meds: . amLODipine  10 mg Oral Daily  . aspirin  325 mg Oral Daily  . atorvastatin  20 mg Oral Daily  . docusate sodium  100 mg Oral BID  . enoxaparin (LOVENOX) injection  40 mg Subcutaneous Q24H  . hydrochlorothiazide  12.5 mg Oral Daily  . ketorolac  7.5 mg Intravenous 3 times per day  . levothyroxine  125 mcg Oral QAC breakfast  . pantoprazole  20 mg Oral Daily  . polyvinyl alcohol  1 drop Both Eyes QHS  . predniSONE  60 mg Oral Q breakfast  . senna  1 tablet Oral BID    Continuous Infusions:    Time spent: 35 mins  Verneita Griffes, MD [facility] [phone]

## 2015-07-20 NOTE — Progress Notes (Signed)
Physical Therapy Treatment Patient Details Name: Marilyn [name] MRN: [id number] DOB: [birth year] Today's Date: 07/20/2015    History of Present Illness Marilyn [name] is a 50 y.o. female with a pmh anemia, uterine fibroids, GERD, and hypothyroidism; who presents with progressively worsening right lower quadrant abdominal/ suprapubic pain over the last week. Imaging showing uterine fibroids; Labs neg, imaging showing uterine fibroids, musculoskeletal pain possible with facet arthropathy noted on imaging; tender at R SI joint    PT Comments    Ms. Blass continues to get up and move with PT despite a considerable amount of pain; During session, she was due for another dose of pain meds soon -- we discussed the fact that she is able to get up and walk at the end of her pain med effectiveness is good -- there will be moments at home when she must get up and around even when pain is severe, and we know she can handle it; Encouragement provided;  Continue to recommend Outpatient PT post dc. Goals met;  Will sign off.  Follow Up Recommendations  Outpatient PT     Equipment Recommendations  Other (comment) (pt choosing not to use RW)    Recommendations for Other Services       Precautions / Restrictions      Mobility  Bed Mobility Overal bed mobility: Modified Independent   Rolling: Modified independent (Device/Increase time) Sidelying to sit: Modified independent (Device/Increase time)       General bed mobility comments: Slow moving and painful, but getting up independently  Transfers Overall transfer level: Modified independent Equipment used: None Transfers: Sit to/from Stand Sit to Stand: Modified independent (Device/Increase time)         General transfer comment: slow moving and painful, but not needing assist  Ambulation/Gait Ambulation/Gait assistance: Modified independent (Device/Increase time) Ambulation Distance (Feet): 80 Feet Assistive device:  None Gait Pattern/deviations: Decreased stance time - right (heavy footfall L) Gait velocity: slow   General Gait Details: Continued slow, guarded, and antalgic gait; not needing assist   Stairs            Wheelchair Mobility    Modified Rankin (Stroke Patients Only)       Balance                                    Cognition Arousal/Alertness: Awake/alert Behavior During Therapy: WFL for tasks assessed/performed Overall Cognitive Status: Within Functional Limits for tasks assessed                      Exercises      General Comments General comments (skin integrity, edema, etc.): Discuss3ed plan to dc acute PT as goals are met; Encouraged pt to walk independently and in the hallways as able      Pertinent Vitals/Pain Pain Assessment: 0-10 Pain Score: 3  Faces Pain Scale: Hurts even more Pain Location: low back, RLQ abdominal, anterior R hip and down to thigh Pain Descriptors / Indicators: Aching;Cramping Pain Intervention(s): Monitored during session    Home Living                      Prior Function            PT Goals (current goals can now be found in the care plan section) Acute Rehab PT Goals Patient Stated Goal: less pain and return to work Progress towards  PT goals: Goals met/education completed, patient discharged from PT    Frequency  Min 3X/week    PT Plan Current plan remains appropriate    Co-evaluation             End of Session   Activity Tolerance: Patient tolerated treatment well;Patient limited by pain Patient left: in bed;with call bell/phone within reach     Time: 1113-1121 PT Time Calculation (min) (ACUTE ONLY): 8 min  Charges:  $Gait Training: 8-22 mins                    G Codes:  Functional Assessment Tool Used: CLinical judgement Functional Limitation: Mobility: Walking and moving around Mobility: Walking and Moving Around Goal Status [phone]): 0 percent impaired, limited or  restricted Mobility: Walking and Moving Around Discharge Status [phone]): At least 1 percent but less than 20 percent impaired, limited or restricted   Marilyn [name] [facility] 07/20/2015, 1:43 PM  Marilyn [name], [place] Pager [phone] Office [phone]

## 2015-07-20 NOTE — Progress Notes (Signed)
I had an extensive conversation with on call physician Dr. Harrington Challenger of GYN I discussed the plan of care up-to-date and my findings when I examined the patient We discussed that this could be a degenerating fibroid likely causing pain-images were reviewed with Dr. Harrington Challenger from a year ago and Dr. Alan Ripper impression telephonically was that this is not a gynecological source for pain and if it is patient may benefit from high-dose nonsteroidals as well as opiates.  I explained  that the patient is on those modalities of treatment already and that family has significant concerns and are requesting transfer of the patient to a tertiary facility.  I have asked for the patient to be evaluated as an inpatient as patient has had facet joint injections on 2/14 and has made minimal progress and is still in severe pain and the only pathology that we can see that is causing her pain at this point is her fibroids based on the CT scan and limited ultrasound which was not a complete exam We appreciate gynecology assistance in this matter and look forward to their input and clinical assessment moving forward.

## 2015-07-20 NOTE — Progress Notes (Signed)
Pt's dtr Janett Billow called and is asking for a referral to be placed at [facility]. Notified her that MD would have to arrange for that. Jessica's phone number is [phone]. MD aware and will follow up with Janett Billow

## 2015-07-21 ENCOUNTER — Encounter: Payer: Self-pay | Admitting: Obstetrics and Gynecology

## 2015-07-21 LAB — OCCULT BLOOD X 1 CARD TO LAB, STOOL: FECAL OCCULT BLD: NEGATIVE

## 2015-07-21 MED ORDER — OXYCODONE-ACETAMINOPHEN 7.5-325 MG PO TABS
1.0000 | ORAL_TABLET | ORAL | Status: DC
  Administered 2015-07-21 – 2015-07-22 (×5): 1 via ORAL
  Filled 2015-07-21 (×5): qty 1

## 2015-07-21 MED ORDER — PREDNISONE 10 MG PO TABS
10.0000 mg | ORAL_TABLET | Freq: Every day | ORAL | Status: DC
  Administered 2015-07-22: 10 mg via ORAL
  Filled 2015-07-21: qty 1

## 2015-07-21 MED ORDER — POLYVINYL ALCOHOL 1.4 % OP SOLN: 1.0000 [drp] | Freq: Every day | OPHTHALMIC | Status: DC

## 2015-07-21 MED ORDER — OXYCODONE-ACETAMINOPHEN 7.5-325 MG PO TABS: 1.0000 | ORAL_TABLET | ORAL | Status: DC

## 2015-07-21 MED ORDER — CYCLOBENZAPRINE HCL 5 MG PO TABS: 5.0000 mg | ORAL_TABLET | Freq: Three times a day (TID) | ORAL | Status: DC | PRN

## 2015-07-21 MED ORDER — LEVOTHYROXINE SODIUM 25 MCG PO TABS
125.0000 ug | ORAL_TABLET | Freq: Every day | ORAL | Status: DC
  Administered 2015-07-21 – 2015-07-22 (×2): 125 ug via ORAL
  Filled 2015-07-21 (×2): qty 1

## 2015-07-21 NOTE — Discharge Summary (Signed)
Physician Discharge Summary  Marilyn [name] [id number] DOB: 1966-02-16 DOA: 07/16/2015  PCP: [name], MD  Admit date: 07/16/2015 Discharge date: 07/21/2015  Time spent: 45 minutes  Recommendations for Outpatient Follow-up:  1. patient is seeking referral to tertiary care center and requested to be discharged from the hospital and states they will go to [facility] or [facility]. I have explained carefully to them that there may bethick organic pathology other than what we have described below and that might limited in terms of what they would do however we are happy to facilitate this  Discharge Diagnoses:  Principal Problem:   RLQ abdominal pain Active Problems:   Hypothyroidism   Hyperlipidemia   GERD   HTN (hypertension)   Pain in lower back   Nausea & vomiting   RUQ abdominal pain   Discharge Condition: guarded  Diet recommendation: regular  Filed Weights   07/16/15 0409 07/16/15 1040  Weight: 90.833 kg (200 lb 4 oz) 90.719 kg (200 lb)    History of present illness:  50 year old female Known history of reflux, Anemia Hypothyroidism Admitted with flank pain and discomfort as well as right lower quadrant pain concerning for intra-abdominal pathology Anterior and posterior shin fibroids noted 5.4 x 5 cm  Because of ongoing pain gynecology was consulted who declined to see the patient on inpatient setting IR was consulted because of back pain and patient was started on Flexeril Toradol prednisone and IR performed facet block  Hospital Course:  Multifactorial Right sided pain-DDX = musculoskeletal versus Uterine fibroid  CT ab/pel did not review any GI pathology, patient reported no problem with BM or urination. Lactic acid wnl , less likely ischemic pain. MRCP performed 2/15 = showed no specific changes other than multiple uterine fibroids similar to prior and no acute findings. SI joint and significant tenderness over right hip medially. 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 Ortho Dr. Rolena Infante consulted, recommended facet block by IR ,   IR consulted on 2/14 and performed facet block which has given some moderate relief steroids were started and these have been tapered on 2/16-10 mg daily 2 off on discharge as she had steroid injection in the back  She experienced some worsening pain on 2/15 p.m. We will continue pain medications moving forward and try and mobilize some more Continue Percocet 2 tablets every 4 when necessaryon discharge   Uterine fibroid: Dr. Erlinda Hong discussed with [facility] Dr Ouida Sills on 2/12 who advised patient to f/u with gyn outpatient. He states no need of inpatient consult. Due to persistent pain, Dr. Erlinda Hong discussed with GYN Philis Pique on 2/14 over the phone , she recommends uterine artery embolization by IR,  MRI pelvis as above Patient seen ultimately by Dr. Harrington Challenger of gynecology 2/15 who recommended pain control outpatient follow-up did a bimanual exam with no specific overt uterine or adnexal findings   later on in the evening on 2/16 I received a call from the family and the nurse in charge of the patient that family had decided to take the patient to a tertiary care facility. Please note I had already had a detailed discussion with the daughter earlier today and expressed that I do not feel with a negative MRCP of the abdomen as well as a negative exam by gynecology that there was anything further that we would doin terms of getting a gastroenterology consult and expressed that some of her pain may resolve  but would resolve slowly given multiple components of the pain including fibroid and sciatic type pain. Patient's family still requesting discharge and they will take the patient to a tertiary care facility which we are happy to accommodate     HTN; stable on home meds  Hypothyroidism: tsh 7, Synthroid dose adjustment, need to repeat tsh in  4weeks.  Hypokalemia: k replaced, mag wnl.  H/o asthma: stable, lung clear   Discharge Exam: Filed Vitals:   07/21/15 0456 07/21/15 1414  BP: 109/70 137/69  Pulse: 66 87  Temp: 98.1 F (36.7 C) 98.3 F (36.8 C)  Resp: 16 20    General: alert oriented Cardiovascular: S1-S2 no murmur rub or gallop Respiratory: clinically clear  Discharge Instructions   Discharge Instructions    Ambulatory referral to Physical Therapy    Complete by:  As directed      Diet - low sodium heart healthy    Complete by:  As directed      Discharge instructions    Complete by:  As directed   Please follow-up a tertiary [facility] for choosing although I do not think management would be any different We will prescribe view limited amount of Percocet to 3 times a day over in terms of pain     Increase activity slowly    Complete by:  As directed      Walker rolling    Complete by:  As directed           Current Discharge Medication List    START taking these medications   Details  cyclobenzaprine (FLEXERIL) 5 MG tablet Take 1 tablet (5 mg total) by mouth 3 (three) times daily as needed for muscle spasms. Qty: 30 tablet, Refills: 0    oxyCODONE-acetaminophen (PERCOCET) 7.5-325 MG tablet Take 1 tablet by mouth every 4 (four) hours. Qty: 30 tablet, Refills: 0    polyvinyl alcohol (LIQUIFILM TEARS) 1.4 % ophthalmic solution Place 1 drop into both eyes at bedtime. Qty: 15 mL, Refills: 0      CONTINUE these medications which have NOT CHANGED   Details  acetaminophen (TYLENOL) 325 MG tablet Take 325 mg by mouth every 6 (six) hours as needed for moderate pain.    albuterol (PROVENTIL HFA;VENTOLIN HFA) 108 (90 Base) MCG/ACT inhaler Inhale 2 puffs into the lungs every 4 (four) hours as needed for wheezing or shortness of breath (cough, shortness of breath or wheezing.). Qty: 1 Inhaler, Refills: 1   Associated Diagnoses: Laryngitis; Lower respiratory infection    amLODipine (NORVASC) 10  MG tablet TAKE ONE TABLET BY MOUTH ONCE DAILY Qty: 90 tablet, Refills: 3    aspirin 325 MG tablet Take 325 mg by mouth daily.    atorvastatin (LIPITOR) 20 MG tablet Take 1 tablet (20 mg total) by mouth daily. Qty: 90 tablet, Refills: 1    Cholecalciferol (VITAMIN D3) 5000 UNITS CAPS Take 1 capsule by mouth 2 (two) times a week.    Cyanocobalamin (VITAMIN B 12 PO) Take 1 tablet by mouth daily.    EPINEPHrine 0.3 mg/0.3 mL IJ SOAJ injection Inject 0.3 mLs (0.3 mg total) into the muscle once. Qty: 1 Device, Refills: 0    Guaifenesin (MUCINEX MAXIMUM STRENGTH) 1200 MG TB12 Take 1 tablet (1,200 mg total) by mouth every 12 (twelve) hours as needed. Qty: 14 tablet, Refills: 1   Associated Diagnoses: Laryngitis; Cough; Lower respiratory infection    hydrochlorothiazide (MICROZIDE) 12.5 MG capsule Take 1 capsule (12.5 mg total) by mouth daily. As  needed for swelling Qty: 90 capsule, Refills: 3    HYDROcodone-acetaminophen (NORCO) 7.5-325 MG tablet Take 1 tablet by mouth every 6 (six) hours as needed for moderate pain. Qty: 40 tablet, Refills: 0    hydroxypropyl methylcellulose / hypromellose (ISOPTO TEARS / GONIOVISC) 2.5 % ophthalmic solution Place 1 drop into both eyes at bedtime.    lansoprazole (PREVACID) 30 MG capsule Take 1 capsule (30 mg total) by mouth 2 (two) times daily before a meal. Qty: 60 capsule, Refills: 11   Associated Diagnoses: Gastroesophageal reflux disease, esophagitis presence not specified; Dysphagia, pharyngoesophageal phase    levothyroxine (SYNTHROID, LEVOTHROID) 125 MCG tablet TAKE ONE TABLET BY MOUTH ONCE DAILY BEFORE BREAKFAST. YEARLY PHYSICAL WITH LABS ARE DUE MUST SEE MD FOR REFILLS Qty: 30 tablet, Refills: 1    loratadine (CLARITIN) 10 MG tablet Take 10 mg by mouth daily as needed for allergies.    Multiple Vitamin (MULTIVITAMIN WITH MINERALS) TABS tablet Take 1 tablet by mouth daily.      STOP taking these medications     azithromycin (ZITHROMAX) 250  MG tablet      benzonatate (TESSALON) 100 MG capsule        Allergies  Allergen Reactions  . Tuna [Fish Allergy] Anaphylaxis  . Codeine Nausea And Vomiting  . Flonase [Fluticasone Propionate] Other (See Comments)    Nose bleeds   Follow-up Information    Follow up with [name], MD In 1 week.   Specialties:  Internal Medicine, Radiology   Contact information:   [address] [place] [address] [phone]        The results of significant diagnostics from this hospitalization (including imaging, microbiology, ancillary and laboratory) are listed below for reference.    Significant Diagnostic Studies: Mr Lumbar Spine Wo Contrast  07/18/2015  CLINICAL DATA:  Progressive worsening low back and pelvic pain. Right sacroiliac tenderness. EXAM: MRI LUMBAR SPINE WITHOUT CONTRAST TECHNIQUE: Multiplanar, multisequence MR imaging of the lumbar spine was performed. No intravenous contrast was administered. COMPARISON:  CT 07/15/2015. FINDINGS: There is no abnormality of significance at L3-4 or above. The discs are normal. The canal and foramina are widely patent. The distal cord and conus are normal. There is mild facet hypertrophy on the left at T12-L1 and throughout the upper lumbar region but without advanced disease. L4-5: Desiccation and mild bulging of the disc. Mild facet hypertrophy. No compressive stenosis. Findings could be associated with back pain. L5-S1: No disc pathology. Mild facet degeneration and hypertrophy. No compressive stenosis. IMPRESSION: No significant degenerative disc disease. No stenosis of the canal or evidence of focal nerve compression. Facet degeneration and hypertrophy most pronounced at L4-5 and L5-S1 and to a lesser degree at L3-4 and above. This could be associated with low back pain or referred facet syndrome pain. This does not appear to represent advanced disease however. Electronically Signed   By: Nelson Chimes M.D.   On: 07/18/2015 11:46   Mr  Pelvis W Wo Contrast  07/21/2015  CLINICAL DATA:  Progressive right lower quadrant pain. History of anemia and uterine fibroids. EXAM: MRI PELVIS WITHOUT AND WITH CONTRAST TECHNIQUE: Multiplanar multisequence MR imaging of the pelvis was performed both before and after administration of intravenous contrast. CONTRAST:  14mL MULTIHANCE GADOBENATE DIMEGLUMINE 529 MG/ML IV SOLN COMPARISON:  Pelvic ultrasound 04/05/2016. Abdominal pelvic CT 07/15/2015. FINDINGS: Urinary Tract: The visualized distal ureters and bladder appear unremarkable. Bowel: No bowel wall thickening, distention or surrounding inflammation identified within the pelvis. Vascular/Lymphatic: No  enlarged pelvic lymph nodes identified. No significant vascular findings. Reproductive: The uterus measures 8.3 x 6.9 x 7.9 cm and contains multiple intramural fibroids. The largest fibroid in the left fundal region measures up to 6.2 cm. There is fibroid in the right uterine body measuring up to 4.6 cm. The endometrium is distorted by the fibroids and not well visualized, but does not appear thickened. Both ovaries are seen and appear unremarkable. Other: The visualized anterior abdominal wall appears normal. No pelvic ascites. Musculoskeletal: No acute or worrisome osseous findings. There are mild degenerative changes at both hips and in the lower lumbar spine IMPRESSION: 1. Multiple uterine fibroids, similar to previous examinations. 2. The endometrium and ovaries appear unremarkable. 3. No acute findings. Electronically Signed   By: Richardean Sale M.D.   On: 07/21/2015 08:17   US Transvaginal Non-ob  07/16/2015  CLINICAL DATA:  Acute onset right lower quadrant abdominal pain. Initial encounter. EXAM: TRANSABDOMINAL AND TRANSVAGINAL ULTRASOUND OF PELVIS TECHNIQUE: Both transabdominal and transvaginal ultrasound examinations of the pelvis were performed. Transabdominal technique was performed for global imaging of the pelvis including uterus, ovaries,  adnexal regions, and pelvic cul-de-sac. It was necessary to proceed with endovaginal exam following the transabdominal exam to visualize the ovaries in greater detail. COMPARISON:  CT of the abdomen and pelvis performed 07/15/2015 FINDINGS: Uterus Measurements: 9.2 x 4.7 x 6.9 cm. Anterior and posterior uterine fibroids are seen, measuring 5.4 cm and 5.0 cm in size. Endometrium Thickness: 0.8 cm.  No focal abnormality visualized. Right ovary Not visualized. Left ovary Not visualized. Other findings No free fluid is seen within the pelvic cul-de-sac. IMPRESSION: 1. The ovaries are not seen. No ancillary findings seen to suggest ovarian torsion. Evaluation is suboptimal due to patient's pelvic pain. 2. Large anterior and posterior uterine fibroids, measuring 5.4 cm and 5.0 cm in size. Uterus otherwise unremarkable. Electronically Signed   By: Garald Balding M.D.   On: 07/16/2015 06:07   US Pelvis Complete  07/16/2015  CLINICAL DATA:  Acute onset right lower quadrant abdominal pain. Initial encounter. EXAM: TRANSABDOMINAL AND TRANSVAGINAL ULTRASOUND OF PELVIS TECHNIQUE: Both transabdominal and transvaginal ultrasound examinations of the pelvis were performed. Transabdominal technique was performed for global imaging of the pelvis including uterus, ovaries, adnexal regions, and pelvic cul-de-sac. It was necessary to proceed with endovaginal exam following the transabdominal exam to visualize the ovaries in greater detail. COMPARISON:  CT of the abdomen and pelvis performed 07/15/2015 FINDINGS: Uterus Measurements: 9.2 x 4.7 x 6.9 cm. Anterior and posterior uterine fibroids are seen, measuring 5.4 cm and 5.0 cm in size. Endometrium Thickness: 0.8 cm.  No focal abnormality visualized. Right ovary Not visualized. Left ovary Not visualized. Other findings No free fluid is seen within the pelvic cul-de-sac. IMPRESSION: 1. The ovaries are not seen. No ancillary findings seen to suggest ovarian torsion. Evaluation is  suboptimal due to patient's pelvic pain. 2. Large anterior and posterior uterine fibroids, measuring 5.4 cm and 5.0 cm in size. Uterus otherwise unremarkable. Electronically Signed   By: Garald Balding M.D.   On: 07/16/2015 06:07   Ct Abdomen Pelvis W Contrast  07/15/2015  CLINICAL DATA:  Right lower quadrant pain and nausea, worsening over the past 5 days. Chronic constipation. History of fibroids. Evaluation for appendicitis. EXAM: CT ABDOMEN AND PELVIS WITH CONTRAST TECHNIQUE: Multidetector CT imaging of the abdomen and pelvis was performed using the standard protocol following bolus administration of intravenous contrast. CONTRAST:  157mL OMNIPAQUE IOHEXOL 300 MG/ML  SOLN COMPARISON:  09/21/2014  FINDINGS: The visualized lung bases are free of consolidation or fusion. No focal liver lesion is identified. There is no biliary dilatation. The gallbladder, spleen, adrenal glands, kidneys, and pancreas are unremarkable. Oral contrast is present in multiple loops of nondilated small bowel. There is no evidence of bowel obstruction. The cecum is located in the right mid upper abdomen. The appendix is unremarkable. Multiple uterine masses are again seen, compatible with fibroids, measuring up to 6 cm in size, and not significantly changed from the prior study. Mild enlargement of both ovarian veins is unchanged. The bladder is largely decompressed. No free fluid is seen. Mildly prominent mesenteric lymph nodes in the right abdomen are unchanged. No acute osseous abnormality is identified. IMPRESSION: 1. No acute abnormality identified in the abdomen or pelvis. Normal appendix. 2. Fibroid uterus. Electronically Signed   By: Logan Bores M.D.   On: 07/15/2015 16:16   Ir Fl Guided Loc Of Needle/cath Tip For Spinal Inject Rt  07/19/2015  CLINICAL DATA:  Symptomatic lumbar facet disease. EXAM: IR RIGHT FLOURO GUIDE SPINAL/SI JOINT  INJECTION ANESTHESIA/SEDATION: Local 1% MEDICATIONS: None. CONTRAST:  None. PROCEDURE:  The risks and benefits of the procedure were discussed with the patient who agreed to proceed. We discussed the high likelihood of improvement post procedure. Time-out was performed. Appropriate skin entry site was chosen, cleansed with Betadine, and anesthetized 1% lidocaine. 22 gauge spinal needle was placed within the RIGHT L3-4 facet joint and confirmed fluoroscopically. Contrast was not administered as this decreases the effective volume of the facet joint for steroid. I injected 30 mg Depo-Medrol along with 1 mL 1% lidocaine. An identical procedure was performed on the RIGHT at L5-S1. An identical procedure was performed on the RIGHT at L4-5, except that due to the imaging appearance and the fluoroscopic appearance, I gave 60 mg of Depo-Medrol at this location, intra-articular and periarticular, as this appeared to be potentially the most symptomatic facet. Of note, the patient was most symptomatic at the L4-5 level, during injection, a finding concordant with the imaging. COMPLICATIONS: None immediate FINDINGS: See above. IMPRESSION: Technically successful RIGHT L3-4, L4-5, and L5-S1 facet injections. The patient was comfortable at time of procedure cessation. Expected time course of steroid and anesthetic was discussed with the patient. Electronically Signed   By: Staci Righter M.D.   On: 07/19/2015 14:11   Ir Fl Guided Loc Of Needle/cath Tip For Spinal Inject Rt  07/19/2015  CLINICAL DATA:  Symptomatic lumbar facet disease. EXAM: IR RIGHT FLOURO GUIDE SPINAL/SI JOINT  INJECTION ANESTHESIA/SEDATION: Local 1% MEDICATIONS: None. CONTRAST:  None. PROCEDURE: The risks and benefits of the procedure were discussed with the patient who agreed to proceed. We discussed the high likelihood of improvement post procedure. Time-out was performed. Appropriate skin entry site was chosen, cleansed with Betadine, and anesthetized 1% lidocaine. 22 gauge spinal needle was placed within the RIGHT L3-4 facet joint and  confirmed fluoroscopically. Contrast was not administered as this decreases the effective volume of the facet joint for steroid. I injected 30 mg Depo-Medrol along with 1 mL 1% lidocaine. An identical procedure was performed on the RIGHT at L5-S1. An identical procedure was performed on the RIGHT at L4-5, except that due to the imaging appearance and the fluoroscopic appearance, I gave 60 mg of Depo-Medrol at this location, intra-articular and periarticular, as this appeared to be potentially the most symptomatic facet. Of note, the patient was most symptomatic at the L4-5 level, during injection, a finding concordant with the imaging. COMPLICATIONS: None  immediate FINDINGS: See above. IMPRESSION: Technically successful RIGHT L3-4, L4-5, and L5-S1 facet injections. The patient was comfortable at time of procedure cessation. Expected time course of steroid and anesthetic was discussed with the patient. Electronically Signed   By: Staci Righter M.D.   On: 07/19/2015 14:11   Ir Fl Guided Loc Of Needle/cath Tip For Spinal Inject Rt  07/19/2015  CLINICAL DATA:  Symptomatic lumbar facet disease. EXAM: IR RIGHT FLOURO GUIDE SPINAL/SI JOINT  INJECTION ANESTHESIA/SEDATION: Local 1% MEDICATIONS: None. CONTRAST:  None. PROCEDURE: The risks and benefits of the procedure were discussed with the patient who agreed to proceed. We discussed the high likelihood of improvement post procedure. Time-out was performed. Appropriate skin entry site was chosen, cleansed with Betadine, and anesthetized 1% lidocaine. 22 gauge spinal needle was placed within the RIGHT L3-4 facet joint and confirmed fluoroscopically. Contrast was not administered as this decreases the effective volume of the facet joint for steroid. I injected 30 mg Depo-Medrol along with 1 mL 1% lidocaine. An identical procedure was performed on the RIGHT at L5-S1. An identical procedure was performed on the RIGHT at L4-5, except that due to the imaging appearance and the  fluoroscopic appearance, I gave 60 mg of Depo-Medrol at this location, intra-articular and periarticular, as this appeared to be potentially the most symptomatic facet. Of note, the patient was most symptomatic at the L4-5 level, during injection, a finding concordant with the imaging. COMPLICATIONS: None immediate FINDINGS: See above. IMPRESSION: Technically successful RIGHT L3-4, L4-5, and L5-S1 facet injections. The patient was comfortable at time of procedure cessation. Expected time course of steroid and anesthetic was discussed with the patient. Electronically Signed   By: Staci Righter M.D.   On: 07/19/2015 14:11    Microbiology: Recent Results (from the past 240 hour(s))  Urine culture     Status: None   Collection Time: 07/15/15  4:35 PM  Result Value Ref Range Status   Colony Count 40,000 COLONIES/ML  Final   Organism ID, Bacteria Multiple bacterial morphotypes present, none  Final   Organism ID, Bacteria predominant. Suggest appropriate recollection if   Final   Organism ID, Bacteria clinically indicated.  Final     Labs: Basic Metabolic Panel:  Recent Labs Lab 07/16/15 0436 07/16/15 0929 07/17/15 0551 07/18/15 0529 07/19/15 0952 07/20/15 1006  NA 138  --  141 136 141 138  K 3.4*  --  3.5 4.0 3.7 3.8  CL 103  --  105 102 102 101  CO2 24  --  27 23 27 27   GLUCOSE 134*  --  104* 159* 105* 154*  BUN 15  --  8 16 13 19   CREATININE 0.79 0.73 0.81 0.83 0.84 1.01*  CALCIUM 8.7*  --  8.1* 8.8* 8.9 8.3*  MG  --   --   --  2.2 2.3  --    Liver Function Tests:  Recent Labs Lab 07/16/15 0436  AST 22  ALT 19  ALKPHOS 70  BILITOT 0.2*  PROT 7.5  ALBUMIN 3.7    Recent Labs Lab 07/15/15 1651 07/16/15 0436  LIPASE 72.0* 28   No results for input(s): AMMONIA in the last 168 hours. CBC:  Recent Labs Lab 07/15/15 1703 07/16/15 0436 07/17/15 0551 07/18/15 0529 07/20/15 1006  WBC 5.3 4.8 3.6* 6.8 9.4  NEUTROABS 2.7 2.4  --   --  7.3  HGB 12.6 12.0 11.4* 12.2  12.4  HCT 37.2 36.6 34.9* 37.2 37.1  MCV 81.0 81.2 82.5 81.2  82.1  PLT 262 231 216 253 244   Cardiac Enzymes: No results for input(s): CKTOTAL, CKMB, CKMBINDEX, TROPONINI in the last 168 hours. BNP: BNP (last 3 results)  Recent Labs  09/28/14 1528  BNP 9.7    ProBNP (last 3 results) No results for input(s): PROBNP in the last 8760 hours.  CBG: No results for input(s): GLUCAP in the last 168 hours.     SignedNita Sells MD   [facility] 07/21/2015, 6:27 PM

## 2015-07-21 NOTE — Progress Notes (Signed)
PROGRESS NOTE  Marilyn [name] [id number] DOB: 09-09-1965 DOA: 07/16/2015 PCP: [name], MD  HPI/Recap of past 66 hours:  50 year old female Known history of reflux, Anemia Hypothyroidism Admitted with flank pain and discomfort as well as right lower quadrant pain concerning for intra-abdominal pathology Anterior and posterior shin fibroids noted 5.4 x 5 cm  Because of ongoing pain gynecology was consulted who declined to see the patient on inpatient setting IR was consulted because of back pain and patient was started on Flexeril Toradol prednisone and IR performed facet block   Assessment/Plan: Principal Problem:   RLQ abdominal pain Active Problems:   Hypothyroidism   Hyperlipidemia   GERD   HTN (hypertension)   Pain in lower back   Nausea & vomiting   RUQ abdominal pain  Multifactorial Right sided pain-DDX = musculoskeletal versus Uterine fibroid  CT ab/pel did not review any GI pathology, patient reported no problem with BM or urination. Lactic acid wnl , less likely ischemic pain. MRCP performed 2/15 = showed no specific changes other than multiple uterine fibroids similar to prior and no acute findings. SI joint and significant tenderness over right hip medially. Would continue flexeril 2/12  steroids were started and these have been tapered on 2/16-10 mg daily MRI lumber spine with facet joint disease, no disc or cord involvement. Ortho Dr. Rolena Infante consulted, recommended facet block by IR ,  IR consulted on 2/14 and performed facet block which has given some moderate relief She experienced some worsening pain on 2/15 p.m. We will continue pain medications moving forward and try and mobilize some more Continue Percocet 2 tablets every 4 when necessary and transitioned to scheduled on 2/16 Dilaudid 0.5 to 1 mg every 2 when necessary to continue  Uterine fibroid: Dr. Erlinda Hong discussed with [facility] Dr Ouida Sills on 2/12 who advised patient to f/u with gyn  outpatient. He states no need of inpatient consult. Due to persistent pain, Dr. Erlinda Hong discussed with GYN Philis Pique on 2/14 over the phone , she recommends uterine artery embolization by IR,  MRI pelvis as above Patient seen ultimately by Dr. Harrington Challenger of gynecology 2/15 who recommended pain control outpatient follow-up did a bimanual exam with no specific overt uterine or adnexal findings  HTN; stable on home meds  Hypothyroidism: tsh 7,  Synthroid dose adjustment, need to repeat tsh in 4weeks.  Hypokalemia:  k replaced, mag wnl.  H/o asthma: stable, lung clear    Code Status: full  Family Communication: patient , discussed with daughter at the bedside  Disposition Plan: home when pain is under control   Consultants:  Phone conversation with Dr Ouida Sills on 2/12 and with Dr Philis Pique on 2/14  Phone conversation with Radiology Dr. Maryland Pink to review CT ab/pel  Orthopedics Dr Rolena Infante  IR phone conversation with Dr watts and Dr Jobe Igo on 2/14  Procedures:  Facet joint injection on 2/14  Antibiotics:  none   Objective: BP 109/70 mmHg  Pulse 66  Temp(Src) 98.1 F (36.7 C) (Oral)  Resp 16  Ht 5\' 4"  (1.626 m)  Wt 90.719 kg (200 lb)  BMI 34.31 kg/m2  SpO2 95%  LMP 02/21/2013  Intake/Output Summary (Last 24 hours) at 07/21/15 1313 Last data filed at 07/21/15 0837  Gross per 24 hour  Intake    960 ml  Output      0 ml  Net    960 ml   Filed Weights   07/16/15 0409 07/16/15 1040  Weight: 90.833 kg (200 lb  4 oz) 90.719 kg (200 lb)    Exam:   General:  NAD  Cardiovascular: RRR  Respiratory: CTABL  Abdomen: Soft/ND/NT, positive BS.  Significant pain in the abdomen in RUQ seems out of proportion  Musculoskeletal: No Edema, tenderness at right SI joint, tenderness right hip medially, not able to raise leg against resistance due to significant right hip pain  Neuro: aaox3  Data Reviewed: Basic Metabolic Panel:  Recent Labs Lab 07/16/15 0436 07/16/15 0929  07/17/15 0551 07/18/15 0529 07/19/15 0952 07/20/15 1006  NA 138  --  141 136 141 138  K 3.4*  --  3.5 4.0 3.7 3.8  CL 103  --  105 102 102 101  CO2 24  --  27 23 27 27   GLUCOSE 134*  --  104* 159* 105* 154*  BUN 15  --  8 16 13 19   CREATININE 0.79 0.73 0.81 0.83 0.84 1.01*  CALCIUM 8.7*  --  8.1* 8.8* 8.9 8.3*  MG  --   --   --  2.2 2.3  --    Liver Function Tests:  Recent Labs Lab 07/16/15 0436  AST 22  ALT 19  ALKPHOS 70  BILITOT 0.2*  PROT 7.5  ALBUMIN 3.7    Recent Labs Lab 07/15/15 1651 07/16/15 0436  LIPASE 72.0* 28   No results for input(s): AMMONIA in the last 168 hours. CBC:  Recent Labs Lab 07/15/15 1703 07/16/15 0436 07/17/15 0551 07/18/15 0529 07/20/15 1006  WBC 5.3 4.8 3.6* 6.8 9.4  NEUTROABS 2.7 2.4  --   --  7.3  HGB 12.6 12.0 11.4* 12.2 12.4  HCT 37.2 36.6 34.9* 37.2 37.1  MCV 81.0 81.2 82.5 81.2 82.1  PLT 262 231 216 253 244   Cardiac Enzymes:   No results for input(s): CKTOTAL, CKMB, CKMBINDEX, TROPONINI in the last 168 hours. BNP (last 3 results)  Recent Labs  09/28/14 1528  BNP 9.7    ProBNP (last 3 results) No results for input(s): PROBNP in the last 8760 hours.  CBG: No results for input(s): GLUCAP in the last 168 hours.  Recent Results (from the past 240 hour(s))  Urine culture     Status: None   Collection Time: 07/15/15  4:35 PM  Result Value Ref Range Status   Colony Count 40,000 COLONIES/ML  Final   Organism ID, Bacteria Multiple bacterial morphotypes present, none  Final   Organism ID, Bacteria predominant. Suggest appropriate recollection if   Final   Organism ID, Bacteria clinically indicated.  Final     Studies: Mr Pelvis W Wo Contrast  07/21/2015  CLINICAL DATA:  Progressive right lower quadrant pain. History of anemia and uterine fibroids. EXAM: MRI PELVIS WITHOUT AND WITH CONTRAST TECHNIQUE: Multiplanar multisequence MR imaging of the pelvis was performed both before and after administration of  intravenous contrast. CONTRAST:  45mL MULTIHANCE GADOBENATE DIMEGLUMINE 529 MG/ML IV SOLN COMPARISON:  Pelvic ultrasound 04/05/2016. Abdominal pelvic CT 07/15/2015. FINDINGS: Urinary Tract: The visualized distal ureters and bladder appear unremarkable. Bowel: No bowel wall thickening, distention or surrounding inflammation identified within the pelvis. Vascular/Lymphatic: No enlarged pelvic lymph nodes identified. No significant vascular findings. Reproductive: The uterus measures 8.3 x 6.9 x 7.9 cm and contains multiple intramural fibroids. The largest fibroid in the left fundal region measures up to 6.2 cm. There is fibroid in the right uterine body measuring up to 4.6 cm. The endometrium is distorted by the fibroids and not well visualized, but does not appear thickened. Both ovaries  are seen and appear unremarkable. Other: The visualized anterior abdominal wall appears normal. No pelvic ascites. Musculoskeletal: No acute or worrisome osseous findings. There are mild degenerative changes at both hips and in the lower lumbar spine IMPRESSION: 1. Multiple uterine fibroids, similar to previous examinations. 2. The endometrium and ovaries appear unremarkable. 3. No acute findings. Electronically Signed   By: Richardean Sale M.D.   On: 07/21/2015 08:17    Scheduled Meds: . amLODipine  10 mg Oral Daily  . aspirin  325 mg Oral Daily  . atorvastatin  20 mg Oral Daily  . docusate sodium  100 mg Oral BID  . enoxaparin (LOVENOX) injection  40 mg Subcutaneous Q24H  . hydrochlorothiazide  12.5 mg Oral Daily  . levothyroxine  125 mcg Oral QAC breakfast  . pantoprazole  20 mg Oral Daily  . polyvinyl alcohol  1 drop Both Eyes QHS  . predniSONE  60 mg Oral Q breakfast  . senna  1 tablet Oral BID    Continuous Infusions:    Time spent: 35 mins  Verneita Griffes, MD [facility] [phone]

## 2015-07-21 NOTE — Care Management Note (Signed)
Case Management Note  Patient Details  Name: Marilyn [name] MRN: [id number] Date of Birth: May 04, 1966  Subjective/Objective:                    Action/Plan:   Expected Discharge Date:                  Expected Discharge Plan:  Home/Self Care  In-House Referral:     Discharge planning Services     Post Acute Care Choice:    Choice offered to:     DME Arranged:    DME Agency:     HH Arranged:    HH Agency:     Status of Service:  In process, will continue to follow  Medicare Important Message Given:    Date Medicare IM Given:    Medicare IM give by:    Date Additional Medicare IM Given:    Additional Medicare Important Message give by:     If discussed at [place] of Stay Meetings, dates discussed:  07-21-15 UR updated   Additional Comments:  Marilu Favre, RN 07/21/2015, 10:59 AM

## 2015-07-21 NOTE — Consult Note (Signed)
  Gynecology  S: 50 yo AA female admitted over the weekend with progressive lower abdominal pain. Pt states the week prior to admission she began having progressive lower abdominal pain. On Saturday her pain became more acute and she was brought to the hospital for admission. She denies vaginal bleeding, abnormal discharge, recent sexual activity. She has been able to urinate and have bowel movements without difficulty during this admission O: Filed Vitals:   07/20/15 0600 07/20/15 1400 07/20/15 2105 07/21/15 0456  BP: 121/78 129/68 121/66 109/70  Pulse: 58 84 87 66  Temp: 97.8 F (36.6 C) 97.4 F (36.3 C) 98.3 F (36.8 C) 98.1 F (36.7 C)  TempSrc: Oral Oral Oral Oral  Resp: 16 20 20 16   Height:      Weight:      SpO2: 98% 100% 100% 95%   AOx3 Abd soft, tender to palpation in lower abdomen and over pubic symphysis, slight rebound NEFG, pelvic exam limited by inadequate equipment (no stirrups) Vagina smooth Probable urethral diverticulum vs skenes gland palpated (unable to visualize) Enlarged uterus with irregular contour. Pt tender on exam but not specifically with palpation of the uterus or cervix Unable to palpate adnexa Rectal normal tone, smooth, stool in rectum. Tenderness greatest with rectal exam. RV septum not thickened  A/P  1) Known uterine fibroids, no significant changes in size of the fibroids over several years. Acute fibroid necrosis unlikely in a postmenopausal woman.  Acute ovarian torsion unlikely. MRI confirms normal appearing ovaries. Given patient is able to urinate and defecate uterine incarceration not likely 2) Pain most significant on rectal exam. Hemoccult pending 3) From GYN perspective, would recommend managing fibroids as outpatient. Optimize pain management with the possibility that no pain may not be realistic goal. Would FU in office

## 2015-07-22 ENCOUNTER — Inpatient Hospital Stay (HOSPITAL_COMMUNITY): Payer: BC Managed Care – PPO

## 2015-07-22 NOTE — Progress Notes (Signed)
Discharge paperwork given to patient and patient's daughter. Questions answered. IV removed. Prescription given to the patient. Patient is ready to be discharged.

## 2015-07-22 NOTE — Progress Notes (Signed)
I had an extensive discussion with the patient's daughter yesterday evening on 2/16 trying to explain our standpoint that there'll be no changes that would be made really regarding treatment and further options for treatment as most for imaging studies do not point to any other organic cause of intra-abdominal pathology other than her fibroids which are degenerating. Patient has been noted to be stoic and not taking medications as scheduled and claiming that her pain is 3/10 however at other times it is noted that she is in severe pain and winces significantly when I examine her. The daughter expressed that the patient's face was swollen and that we were "doing nothing to figure out what is going on" The patient's daughter also mentioned to me that she had googled her symptoms and was asking whether this could be diverticulitis-I explained once again that CT scan was completely benign from before and that MRCP would've shown some findings although was not a completely abdominal film it would've shown pelvic findings of any other issue going on My plan for today was 2 get an abdominal series to see if there is any free air or other issue or constipation however reported to the nurse by daughter that "her face is still swollen" and they demanded to be released from the hospital immediately. Patient is welcome to discharge to home and they can pursue further tertiary care as they see fit . We have prescribed her with pain medications for what is presumed to be her lumbar radiculopathy and possibly fibroid pain.

## 2015-08-02 ENCOUNTER — Other Ambulatory Visit: Payer: Self-pay | Admitting: Internal Medicine

## 2015-08-21 ENCOUNTER — Other Ambulatory Visit: Payer: Self-pay | Admitting: Internal Medicine

## 2015-10-24 ENCOUNTER — Other Ambulatory Visit: Payer: Self-pay | Admitting: Internal Medicine

## 2015-12-30 ENCOUNTER — Other Ambulatory Visit (HOSPITAL_BASED_OUTPATIENT_CLINIC_OR_DEPARTMENT_OTHER): Payer: BC Managed Care – PPO

## 2015-12-30 ENCOUNTER — Ambulatory Visit (HOSPITAL_BASED_OUTPATIENT_CLINIC_OR_DEPARTMENT_OTHER): Payer: BC Managed Care – PPO | Admitting: Oncology

## 2015-12-30 VITALS — BP 116/68 | HR 102 | Temp 97.8°F | Resp 18 | Ht 64.0 in | Wt 203.7 lb

## 2015-12-30 DIAGNOSIS — N920 Excessive and frequent menstruation with regular cycle: Secondary | ICD-10-CM

## 2015-12-30 DIAGNOSIS — D500 Iron deficiency anemia secondary to blood loss (chronic): Secondary | ICD-10-CM

## 2015-12-30 DIAGNOSIS — D241 Benign neoplasm of right breast: Secondary | ICD-10-CM

## 2015-12-30 DIAGNOSIS — D509 Iron deficiency anemia, unspecified: Secondary | ICD-10-CM

## 2015-12-30 LAB — CBC WITH DIFFERENTIAL/PLATELET
BASO%: 0.2 % (ref 0.0–2.0)
Basophils Absolute: 0 10*3/uL (ref 0.0–0.1)
EOS%: 0.9 % (ref 0.0–7.0)
Eosinophils Absolute: 0.1 10*3/uL (ref 0.0–0.5)
HEMATOCRIT: 37.9 % (ref 34.8–46.6)
HGB: 13 g/dL (ref 11.6–15.9)
LYMPH#: 2.5 10*3/uL (ref 0.9–3.3)
LYMPH%: 45.7 % (ref 14.0–49.7)
MCH: 27.7 pg (ref 25.1–34.0)
MCHC: 34.3 g/dL (ref 31.5–36.0)
MCV: 80.6 fL (ref 79.5–101.0)
MONO#: 0.3 10*3/uL (ref 0.1–0.9)
MONO%: 6 % (ref 0.0–14.0)
NEUT%: 47.2 % (ref 38.4–76.8)
NEUTROS ABS: 2.6 10*3/uL (ref 1.5–6.5)
PLATELETS: 221 10*3/uL (ref 145–400)
RBC: 4.7 10*6/uL (ref 3.70–5.45)
RDW: 13.1 % (ref 11.2–14.5)
WBC: 5.5 10*3/uL (ref 3.9–10.3)

## 2015-12-30 NOTE — Progress Notes (Signed)
Marilyn [name]   Marilyn Cower, MD Marilyn [name] [id number]  DIAGNOSIS: 50 year old woman with iron deficiency anemia dating back to 2012. Anemia related to chronic blood loss and menorrhagia.  Prior therapy: The patient has received Feraheme on multiple occasions the most recent of which was in October 2015.   CURRENT THERAPY: Observation and surveillance. IV iron as needed.     INTERVAL HISTORY:  Marilyn [name] presents today for a follow-up visit. Since the last visit, she was hospitalized in February 2017 for back pain and abdominal pain. She was found to have constipation and her evaluation did not reveal any clear-cut pathology. Since that time, the symptoms of resolved and does not report any recurrent GI issues. She denied any nausea or vomiting. She denied any dyspepsia.   She has not reported any bleeding complications since the last visit including hematochezia, melena, epistaxis or hematuria. She continues to work full time and very active. She has not reported any decline in her energy her performance status.    She does not report any fevers chills or sweats. She does not report any headaches him a syncope or seizures. She does not report any chest pain or palpitation. She does not report any nausea, vomiting, abdominal pain. She does not report any frequency urgency or hesitancy. She does not report any skeletal complaints. Remaining review of systems unremarkable.  MEDICAL HISTORY: Past Medical History:  Diagnosis Date  . 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/2007  . ASTHMA 01/30/2007  . Breast tumor    right  . GERD 01/31/2007  . Hemorrhoids   . Hiatal hernia   . HTN (hypertension) 09/09/2011  . Hyperlipidemia   . HYPOTHYROIDISM 01/30/2007  . [place] tear 04/2003    I ALLERGIES:  is allergic to tuna [fish allergy]; codeine; and flonase [fluticasone propionate].  MEDICATIONS:   Current Outpatient Prescriptions   Medication Sig Dispense Refill  . acetaminophen (TYLENOL) 325 MG tablet Take 325 mg by mouth every 6 (six) hours as needed for moderate pain.    [name] albuterol (PROVENTIL HFA;VENTOLIN HFA) 108 (90 Base) MCG/ACT inhaler Inhale 2 puffs into the lungs every 4 (four) hours as needed for wheezing or shortness of breath (cough, shortness of breath or wheezing.). 1 Inhaler 1  . amLODipine (NORVASC) 10 MG tablet TAKE ONE TABLET BY MOUTH ONCE DAILY 90 tablet 3  . amLODipine (NORVASC) 10 MG tablet TAKE ONE TABLET BY MOUTH ONCE DAILY 90 tablet 2  . aspirin 325 MG tablet Take 325 mg by mouth daily.    [name] atorvastatin (LIPITOR) 20 MG tablet Take 1 tablet (20 mg total) by mouth daily. 90 tablet 1  . Cholecalciferol (VITAMIN D3) 5000 UNITS CAPS Take 1 capsule by mouth 2 (two) times a week.    . Cyanocobalamin (VITAMIN B 12 PO) Take 1 tablet by mouth daily.    . cyclobenzaprine (FLEXERIL) 5 MG tablet Take 1 tablet (5 mg total) by mouth 3 (three) times daily as needed for muscle spasms. 30 tablet 0  . EPINEPHrine 0.3 mg/0.3 mL IJ SOAJ injection Inject 0.3 mLs (0.3 mg total) into the muscle once. 1 Device 0  . Guaifenesin (MUCINEX MAXIMUM STRENGTH) 1200 MG TB12 Take 1 tablet (1,200 mg total) by mouth every 12 (twelve) hours as needed. 14 tablet 1  . hydrochlorothiazide (MICROZIDE) 12.5 MG capsule Take 1 capsule (12.5 mg total) by mouth daily. As needed for swelling 90 capsule 3  . HYDROcodone-acetaminophen ([place])  7.5-325 MG tablet Take 1 tablet by mouth every 6 (six) hours as needed for moderate pain. 40 tablet 0  . hydroxypropyl methylcellulose / hypromellose (ISOPTO TEARS / GONIOVISC) 2.5 % ophthalmic solution Place 1 drop into both eyes at bedtime.    . lansoprazole (PREVACID) 30 MG capsule Take 1 capsule (30 mg total) by mouth 2 (two) times daily before a meal. 60 capsule 11  . levothyroxine (SYNTHROID, LEVOTHROID) 125 MCG tablet TAKE ONE TABLET BY MOUTH ONCE DAILY BEFORE  BREAKFAST 90 tablet 3  . loratadine  (CLARITIN) 10 MG tablet Take 10 mg by mouth daily as needed for allergies.    . Multiple Vitamin (MULTIVITAMIN WITH MINERALS) TABS tablet Take 1 tablet by mouth daily.    [name] oxyCODONE-acetaminophen (PERCOCET) 7.5-325 MG tablet Take 1 tablet by mouth every 4 (four) hours. 30 tablet 0  . polyvinyl alcohol (LIQUIFILM TEARS) 1.4 % ophthalmic solution Place 1 drop into both eyes at bedtime. 15 mL 0   No current facility-administered medications for this visit.      PHYSICAL EXAMINATION: ECOG PERFORMANCE STATUS: 0  Blood pressure 116/68, pulse (!) 102, temperature 97.8 F (36.6 C), temperature source Oral, resp. rate 18, height 5\' 4"  (1.626 m), weight 203 lb 11.2 oz (92.4 kg), last menstrual period 02/21/2013, SpO2 99 %.   GENERAL: Alert, awake woman without distress. EYES:  sclera clear without icterus. OROPHARYNX:No oral thrush. Oral mucosa is moist and pink. NECK: supple, thyroid normal size, non-tender, without nodularity LYMPH:  no palpable lymphadenopathy in the cervical, axillary or supraclavicular LUNGS: clear to auscultation without rhonchi or dullness to percussion. HEART: regular rate & rhythm and no murmurs and no lower extremity edema ABDOMEN:abdomen soft, non-tender and normal bowel sounds no splenomegaly. Musculoskeletal:no cyanosis of digits and no clubbing  NEURO: alert & oriented x 3 with fluent speech, no focal motor/sensory deficits  CBC    Component Value Date/Time   WBC 9.4 07/20/2015 1006   RBC 4.52 07/20/2015 1006   HGB 12.4 07/20/2015 1006   HGB 13.1 06/03/2015 1416   HCT 37.1 07/20/2015 1006   HCT 40.2 06/03/2015 1416   PLT 244 07/20/2015 1006   PLT 232 06/03/2015 1416   MCV 82.1 07/20/2015 1006   MCV 82.5 06/03/2015 1416   MCH 27.4 07/20/2015 1006   MCHC 33.4 07/20/2015 1006   RDW 13.0 07/20/2015 1006   RDW 13.0 06/03/2015 1416   LYMPHSABS 1.6 07/20/2015 1006   LYMPHSABS 2.1 06/03/2015 1416   MONOABS 0.5 07/20/2015 1006   MONOABS 0.4 06/03/2015 1416    EOSABS 0.0 07/20/2015 1006   EOSABS 0.1 06/03/2015 1416   BASOSABS 0.0 07/20/2015 1006   BASOSABS 0.0 06/03/2015 1416      Chemistry      Component Value Date/Time   NA 138 07/20/2015 1006   NA 140 06/03/2015 1416   K 3.8 07/20/2015 1006   K 4.1 06/03/2015 1416   CL 101 07/20/2015 1006   CL 104 08/12/2012 1329   CO2 27 07/20/2015 1006   CO2 27 06/03/2015 1416   BUN 19 07/20/2015 1006   BUN 12.6 06/03/2015 1416   CREATININE 1.01 (H) 07/20/2015 1006   CREATININE 0.9 06/03/2015 1416      Component Value Date/Time   CALCIUM 8.3 (L) 07/20/2015 1006   CALCIUM 9.2 06/03/2015 1416   ALKPHOS 70 07/16/2015 0436   ALKPHOS 82 06/03/2015 1416   AST 22 07/16/2015 0436   AST 21 06/03/2015 1416   ALT 19 07/16/2015 0436  ALT 21 06/03/2015 1416   BILITOT 0.2 (L) 07/16/2015 0436   BILITOT 0.40 06/03/2015 1416      Results for KAYALA, TEJADA (MRN [id number]) as of 12/30/2015 14:50  Ref. Range 06/03/2015 14:16  Iron Latest Ref Range: 41 - 142 ug/dL 82  UIBC Latest Ref Range: 120 - 384 ug/dL 236  TIBC Latest Ref Range: 236 - 444 ug/dL 318  %SAT Latest Ref Range: 21 - 57 % 26  Ferritin Latest Ref Range: 9 - 269 ng/ml 899 (H)    Assessment and plan:    50 year old woman with the following issues.   1. 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 Iron studies in December 2016 also within normal range. She is no longer having any menstrual cycles at this time and she is asymptomatic.  I do not recommend any further iron treatment at this time and have recommended periodic measurements of her blood counts and iron studies.  2. Right-sided flank pain: This have resolved.  3. 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 Status post surgical removal without any evidence of malignancy.  4. Follow-up: Will be as needed in the future.  [facility] MD 12/30/15

## 2016-01-02 LAB — IRON AND TIBC
%SAT: 18 % — ABNORMAL LOW (ref 21–57)
IRON: 60 ug/dL (ref 41–142)
TIBC: 328 ug/dL (ref 236–444)
UIBC: 268 ug/dL (ref 120–384)

## 2016-01-02 LAB — FERRITIN: Ferritin: 513 ng/ml — ABNORMAL HIGH (ref 9–269)

## 2016-08-09 ENCOUNTER — Ambulatory Visit (INDEPENDENT_AMBULATORY_CARE_PROVIDER_SITE_OTHER): Payer: BC Managed Care – PPO | Admitting: Physician Assistant

## 2016-08-09 ENCOUNTER — Ambulatory Visit (INDEPENDENT_AMBULATORY_CARE_PROVIDER_SITE_OTHER): Payer: BC Managed Care – PPO

## 2016-08-09 VITALS — BP 100/78 | HR 88 | Temp 98.4°F | Resp 18 | Ht 64.0 in | Wt 195.0 lb

## 2016-08-09 DIAGNOSIS — G8929 Other chronic pain: Secondary | ICD-10-CM

## 2016-08-09 DIAGNOSIS — M545 Low back pain, unspecified: Secondary | ICD-10-CM

## 2016-08-09 DIAGNOSIS — Z131 Encounter for screening for diabetes mellitus: Secondary | ICD-10-CM

## 2016-08-09 LAB — POCT URINALYSIS DIP (MANUAL ENTRY)
BILIRUBIN UA: NEGATIVE
Blood, UA: NEGATIVE
GLUCOSE UA: NEGATIVE
Leukocytes, UA: NEGATIVE
NITRITE UA: NEGATIVE
Protein Ur, POC: NEGATIVE
Spec Grav, UA: 1.02
Urobilinogen, UA: 2
pH, UA: 7

## 2016-08-09 LAB — GLUCOSE, POCT (MANUAL RESULT ENTRY): POC Glucose: 92 mg/dl (ref 70–99)

## 2016-08-09 MED ORDER — METHYLPREDNISOLONE ACETATE 80 MG/ML IJ SUSP
80.0000 mg | Freq: Once | INTRAMUSCULAR | Status: AC
  Administered 2016-08-09: 80 mg via INTRAMUSCULAR

## 2016-08-09 MED ORDER — CYCLOBENZAPRINE HCL 10 MG PO TABS
5.0000 mg | ORAL_TABLET | Freq: Three times a day (TID) | ORAL | 0 refills | Status: DC | PRN
Start: 2016-08-09 — End: 2017-01-23

## 2016-08-09 NOTE — Progress Notes (Signed)
08/12/2016 4:36 PM   DOB: 02/05/1966 / MRN: [id number]  SUBJECTIVE:  Marilyn [name] is a 51 y.o. female presenting for low back pain and radiation to the left gluteus maximus. The pain is sharp and has been present for about 3 weeks.  She is not worsening or improving.  Has tried ibuprofen and some tylenol without any significant improvement. Denies weakness, numbness.  She is allergic to tuna [fish allergy]; codeine; and flonase [fluticasone propionate].   She  has a past medical history of ANEMIA-IRON DEFICIENCY (01/30/2007); ASTHMA (01/30/2007); Breast tumor; GERD (01/31/2007); Hemorrhoids; Hiatal hernia; HTN (hypertension) (09/09/2011); Hyperlipidemia; HYPOTHYROIDISM (01/30/2007); and Marilyn [name] tear (04/2003).    She  reports that she quit smoking about 36 years ago. Her smoking use included Cigarettes. She has a 0.75 pack-year smoking history. She has never used smokeless tobacco. She reports that she does not drink alcohol or use drugs. She  has no sexual activity history on file. The patient  has a past surgical history that includes Tubal ligation (1987); broken jaw; Breast reduction surgery (1986); Carpal tunnel release (2001); Thyroidectomy; and Breast lumpectomy with radioactive seed localization (Right, 02/25/2015).  Her family history includes Asthma in her maternal grandfather and mother; Breast cancer in her sister; Diabetes in her father and maternal grandmother; Heart disease in her maternal grandmother; Hypertension in her father.  Review of Systems  Constitutional: Negative for chills and fever.  Gastrointestinal: Negative for nausea.  Genitourinary: Negative for dysuria, frequency and urgency.  Neurological: Negative for dizziness.    The problem list and medications were reviewed and updated by myself where necessary and exist elsewhere in the encounter.   OBJECTIVE:  BP 100/78   Pulse 88   Temp 98.4 F (36.9 C) (Oral)   Resp 18   Ht 5\' 4"  (1.626 m)   Wt 195 lb  (88.5 kg)   LMP 03/18/2014   SpO2 98%   BMI 33.47 kg/m   Pulse Readings from Last 3 Encounters:  08/09/16 88  12/30/15 (!) 102  07/22/15 71    Physical Exam  Constitutional: She is oriented to person, place, and time. She appears well-nourished. No distress.  Eyes: EOM are normal. Pupils are equal, round, and reactive to light.  Cardiovascular: Normal rate.   Pulmonary/Chest: Effort normal.  Abdominal: She exhibits no distension.  Musculoskeletal: Normal range of motion. She exhibits tenderness. She exhibits no edema or deformity.  Neurological: She is alert and oriented to person, place, and time. She has normal reflexes. No cranial nerve deficit. Coordination and gait normal.  Skin: Skin is warm and dry. She is not diaphoretic.  Psychiatric: She has a normal mood and affect.  Vitals reviewed.     Results for orders placed or performed in visit on 08/09/16 (from the past 72 hour(s))  POCT urinalysis dipstick     Status: Abnormal   Collection Time: 08/09/16  5:06 PM  Result Value Ref Range   Color, UA yellow yellow   Clarity, UA clear clear   Glucose, UA negative negative   Bilirubin, UA negative negative   Ketones, POC UA trace (5) (A) negative   Spec Grav, UA 1.020    Blood, UA negative negative   pH, UA 7.0    Protein Ur, POC negative negative   Urobilinogen, UA 2.0    Nitrite, UA Negative Negative   Leukocytes, UA Negative Negative  POCT glucose (manual entry)     Status: None   Collection Time: 08/09/16  5:28 PM  Result Value Ref Range   POC Glucose 92 70 - 99 mg/dl    No results found.  ASSESSMENT AND PLAN:  [name] was seen today for back pain.  Diagnoses and all orders for this visit:  Chronic left-sided low back pain without sciatica: She is having some radicular symptoms.  Will go ahead and try depo medrol along with flexeril.  Advised she hold the ibuprofen for the next five days.  She will come back as needed.  -     POCT urinalysis dipstick -      DG Lumbar Spine 2-3 Views; Future -     methylPREDNISolone acetate (DEPO-MEDROL) injection 80 mg; Inject 1 mL (80 mg total) into the muscle once. -     cyclobenzaprine (FLEXERIL) 10 MG tablet; Take 0.5-1 tablets (5-10 mg total) by mouth 3 (three) times daily as needed. -     Care order/instruction:  Screening for diabetes mellitus -     POCT glucose (manual entry)    The patient is advised to call or return to clinic if she does not see an improvement in symptoms, or to seek the care of the closest emergency department if she worsens with the above plan.   Philis Fendt, MHS, PA-C [facility] 08/12/2016 4:36 PM

## 2016-08-09 NOTE — Patient Instructions (Signed)
     IF you received an x-ray today, you will receive an invoice from [facility]. Please contact [facility] at [phone] with questions or concerns regarding your invoice.   IF you received labwork today, you will receive an invoice from [facility]. Please contact [facility] at [phone] with questions or concerns regarding your invoice.   Our billing staff will not be able to assist you with questions regarding bills from these companies.  You will be contacted with the lab results as soon as they are available. The fastest way to get your results is to activate your My Chart account. Instructions are located on the last page of this paperwork. If you have not heard from us regarding the results in 2 weeks, please contact this office.     

## 2016-08-12 ENCOUNTER — Other Ambulatory Visit: Payer: Self-pay | Admitting: Internal Medicine

## 2016-08-16 ENCOUNTER — Ambulatory Visit (INDEPENDENT_AMBULATORY_CARE_PROVIDER_SITE_OTHER): Payer: BC Managed Care – PPO | Admitting: Physician Assistant

## 2016-08-16 VITALS — BP 120/84 | HR 91 | Temp 98.5°F | Resp 16 | Ht 64.0 in | Wt 197.0 lb

## 2016-08-16 DIAGNOSIS — E559 Vitamin D deficiency, unspecified: Secondary | ICD-10-CM

## 2016-08-16 DIAGNOSIS — E039 Hypothyroidism, unspecified: Secondary | ICD-10-CM

## 2016-08-16 DIAGNOSIS — E785 Hyperlipidemia, unspecified: Secondary | ICD-10-CM | POA: Diagnosis not present

## 2016-08-16 DIAGNOSIS — G8929 Other chronic pain: Secondary | ICD-10-CM

## 2016-08-16 DIAGNOSIS — I10 Essential (primary) hypertension: Secondary | ICD-10-CM | POA: Diagnosis not present

## 2016-08-16 DIAGNOSIS — K219 Gastro-esophageal reflux disease without esophagitis: Secondary | ICD-10-CM

## 2016-08-16 DIAGNOSIS — M545 Low back pain, unspecified: Secondary | ICD-10-CM

## 2016-08-16 DIAGNOSIS — R1314 Dysphagia, pharyngoesophageal phase: Secondary | ICD-10-CM | POA: Diagnosis not present

## 2016-08-16 DIAGNOSIS — Z889 Allergy status to unspecified drugs, medicaments and biological substances status: Secondary | ICD-10-CM | POA: Diagnosis not present

## 2016-08-16 MED ORDER — LANSOPRAZOLE 30 MG PO CPDR: 30.0000 mg | DELAYED_RELEASE_CAPSULE | Freq: Two times a day (BID) | ORAL | 11 refills | Status: DC

## 2016-08-16 MED ORDER — AMLODIPINE BESYLATE 10 MG PO TABS: ORAL_TABLET | ORAL | 3 refills | Status: DC

## 2016-08-16 MED ORDER — ATORVASTATIN CALCIUM 20 MG PO TABS: 20.0000 mg | ORAL_TABLET | Freq: Every day | ORAL | 2 refills | Status: DC

## 2016-08-16 MED ORDER — EPINEPHRINE 0.3 MG/0.3ML IJ SOAJ: 0.3000 mg | Freq: Once | INTRAMUSCULAR | 1 refills | Status: AC

## 2016-08-16 NOTE — Patient Instructions (Addendum)
IF you received an x-ray today, you will receive an invoice from [facility]. Please contact [facility] at [phone] with questions or concerns regarding your invoice.   IF you received labwork today, you will receive an invoice from [facility]. Please contact [facility] at [phone] with questions or concerns regarding your invoice.   Our billing staff will not be able to assist you with questions regarding bills from these companies.  You will be contacted with the lab results as soon as they are available. The fastest way to get your results is to activate your My Chart account. Instructions are located on the last page of this paperwork. If you have not heard from [place] regarding the results in 2 weeks, please contact this office.    continue to take the medication as prescribed.  I am going to give you stretches below.  Please review.  Low Back Strain Rehab Ask your health care provider which exercises are safe for you. Do exercises exactly as told by your health care provider and adjust them as directed. It is normal to feel mild stretching, pulling, tightness, or discomfort as you do these exercises, but you should stop right away if you feel sudden pain or your pain gets worse. Do not begin these exercises until told by your health care provider. Stretching and range of motion exercises These exercises warm up your muscles and joints and improve the movement and flexibility of your back. These exercises also help to relieve pain, numbness, and tingling. Exercise A: Single knee to chest   1. Lie on your back on a firm surface with both legs straight. 2. Bend one of your knees. Use your hands to move your knee up toward your chest until you feel a gentle stretch in your lower back and buttock.  Hold your leg in this position by holding onto the front of your knee.  Keep your other leg as straight as possible. 3. Hold for __________ seconds. 4. Slowly return to the  starting position. 5. Repeat with your other leg. Repeat __________ times. Complete this exercise __________ times a day. Exercise B: Prone extension on elbows   1. Lie on your abdomen on a firm surface. 2. Prop yourself up on your elbows. 3. Use your arms to help lift your chest up until you feel a gentle stretch in your abdomen and your lower back.  This will place some of your body weight on your elbows. If this is uncomfortable, try stacking pillows under your chest.  Your hips should stay down, against the surface that you are lying on. Keep your hip and back muscles relaxed. 4. Hold for __________ seconds. 5. Slowly relax your upper body and return to the starting position. Repeat __________ times. Complete this exercise __________ times a day. Strengthening exercises These exercises build strength and endurance in your back. Endurance is the ability to use your muscles for a long time, even after they get tired. Exercise C: Pelvic tilt  1. Lie on your back on a firm surface. Bend your knees and keep your feet flat. 2. Tense your abdominal muscles. Tip your pelvis up toward the ceiling and flatten your lower back into the floor.  To help with this exercise, you may place a small towel under your lower back and try to push your back into the towel. 3. Hold for __________ seconds. 4. Let your muscles relax completely before you repeat this exercise. Repeat __________ times. Complete this exercise __________ times a day. Exercise  D: Alternating arm and leg raises   1. Get on your hands and knees on a firm surface. If you are on a hard floor, you may want to use padding to cushion your knees, such as an exercise mat. 2. Line up your arms and legs. Your hands should be below your shoulders, and your knees should be below your hips. 3. Lift your left leg behind you. At the same time, raise your right arm and straighten it in front of you.  Do not lift your leg higher than your  hip.  Do not lift your arm higher than your shoulder.  Keep your abdominal and back muscles tight.  Keep your hips facing the ground.  Do not arch your back.  Keep your balance carefully, and do not hold your breath. 4. Hold for __________ seconds. 5. Slowly return to the starting position and repeat with your right leg and your left arm. Repeat __________ times. Complete this exercise __________times a day. Exercise J: Single leg lower with bent knees  1. Lie on your back on a firm surface. 2. Tense your abdominal muscles and lift your feet off the floor, one foot at a time, so your knees and hips are bent in an "L" shape (at about 90 degrees).  Your knees should be over your hips and your lower legs should be parallel to the floor. 3. Keeping your abdominal muscles tense and your knee bent, slowly lower one of your legs so your toe touches the ground. 4. Lift your leg back up to return to the starting position.  Do not hold your breath.  Do not let your back arch. Keep your back flat against the ground. 5. Repeat with your other leg. Repeat __________ times. Complete this exercise __________ times a day. Posture and body mechanics   Body mechanics refers to the movements and positions of your body while you do your daily activities. Posture is part of body mechanics. Good posture and healthy body mechanics can help to relieve stress in your body's tissues and joints. Good posture means that your spine is in its natural S-curve position (your spine is neutral), your shoulders are pulled back slightly, and your head is not tipped forward. The following are general guidelines for applying improved posture and body mechanics to your everyday activities. Standing    When standing, keep your spine neutral and your feet about hip-width apart. Keep a slight bend in your knees. Your ears, shoulders, and hips should line up.  When you do a task in which you stand in one place for a long  time, place one foot up on a stable object that is 2-4 inches (5-10 cm) high, such as a footstool. This helps keep your spine neutral. Sitting    When sitting, keep your spine neutral and keep your feet flat on the floor. Use a footrest, if necessary, and keep your thighs parallel to the floor. Avoid rounding your shoulders, and avoid tilting your head forward.  When working at a desk or a computer, keep your desk at a height where your hands are slightly lower than your elbows. Slide your chair under your desk so you are close enough to maintain good posture.  When working at a computer, place your monitor at a height where you are looking straight ahead and you do not have to tilt your head forward or downward to look at the screen. Resting    When lying down and resting, avoid positions that are  most painful for you.  If you have pain with activities such as sitting, bending, stooping, or squatting (flexion-based activities), lie in a position in which your body does not bend very much. For example, avoid curling up on your side with your arms and knees near your chest (fetal position).  If you have pain with activities such as standing for a long time or reaching with your arms (extension-based activities), lie with your spine in a neutral position and bend your knees slightly. Try the following positions:  Lying on your side with a pillow between your knees.  Lying on your back with a pillow under your knees. Lifting    When lifting objects, keep your feet at least shoulder-width apart and tighten your abdominal muscles.  Bend your knees and hips and keep your spine neutral. It is important to lift using the strength of your legs, not your back. Do not lock your knees straight out.  Always ask for help to lift heavy or awkward objects. This information is not intended to replace advice given to you by your health care provider. Make sure you discuss any questions you have with your  health care provider. Document Released: 05/21/2005 Document Revised: 01/26/2016 Document Reviewed: 03/02/2015 Elsevier Interactive Patient Education  2017 [place] DASH stands for "Dietary Approaches to Stop Hypertension." The DASH eating plan is a healthy eating plan that has been shown to reduce high blood pressure (hypertension). It may also reduce your risk for type 2 diabetes, heart disease, and stroke. The DASH eating plan may also help with weight loss. What are tips for following this plan? General guidelines   Avoid eating more than 2,300 mg (milligrams) of salt (sodium) a day. If you have hypertension, you may need to reduce your sodium intake to 1,500 mg a day.  Limit alcohol intake to no more than 1 drink a day for nonpregnant women and 2 drinks a day for men. One drink equals 12 oz of beer, 5 oz of wine, or 1 oz of hard liquor.  Work with your health care provider to maintain a healthy body weight or to lose weight. Ask what an ideal weight is for you.  Get at least 30 minutes of exercise that causes your heart to beat faster (aerobic exercise) most days of the week. Activities may include walking, swimming, or biking.  Work with your health care provider or diet and nutrition specialist (dietitian) to adjust your eating plan to your individual calorie needs. Reading food labels   Check food labels for the amount of sodium per serving. Choose foods with less than 5 percent of the Daily Value of sodium. Generally, foods with less than 300 mg of sodium per serving fit into this eating plan.  To find whole grains, look for the word "whole" as the first word in the ingredient list. Shopping   Buy products labeled as "low-sodium" or "no salt added."  Buy fresh foods. Avoid canned foods and premade or frozen meals. Cooking   Avoid adding salt when cooking. Use salt-free seasonings or herbs instead of table salt or sea salt. Check with your health care  provider or pharmacist before using salt substitutes.  Do not fry foods. Cook foods using healthy methods such as baking, boiling, grilling, and broiling instead.  Cook with heart-healthy oils, such as olive, canola, soybean, or sunflower oil. Meal planning    Eat a balanced diet that includes:  5 or more servings of fruits  and vegetables each day. At each meal, try to fill half of your plate with fruits and vegetables.  Up to 6-8 servings of whole grains each day.  Less than 6 oz of lean meat, poultry, or fish each day. A 3-oz serving of meat is about the same size as a deck of cards. One egg equals 1 oz.  2 servings of low-fat dairy each day.  A serving of nuts, seeds, or beans 5 times each week.  Heart-healthy fats. Healthy fats called Omega-3 fatty acids are found in foods such as flaxseeds and coldwater fish, like sardines, salmon, and mackerel.  Limit how much you eat of the following:  Canned or prepackaged foods.  Food that is high in trans fat, such as fried foods.  Food that is high in saturated fat, such as fatty meat.  Sweets, desserts, sugary drinks, and other foods with added sugar.  Full-fat dairy products.  Do not salt foods before eating.  Try to eat at least 2 vegetarian meals each week.  Eat more home-cooked food and less restaurant, buffet, and fast food.  When eating at a restaurant, ask that your food be prepared with less salt or no salt, if possible. What foods are recommended? The items listed may not be a complete list. Talk with your dietitian about what dietary choices are best for you. Grains  Whole-grain or whole-wheat bread. Whole-grain or whole-wheat pasta. Brown rice. [place]. Bulgur. Whole-grain and low-sodium cereals. Pita bread. Low-fat, low-sodium crackers. Whole-wheat flour tortillas. Vegetables  Fresh or frozen vegetables (raw, steamed, roasted, or grilled). Low-sodium or reduced-sodium tomato and vegetable juice. Low-sodium  or reduced-sodium tomato sauce and tomato paste. Low-sodium or reduced-sodium canned vegetables. Fruits  All fresh, dried, or frozen fruit. Canned fruit in natural juice (without added sugar). Meat and other protein foods  Skinless chicken or [place]. Ground chicken or [place]. Pork with fat trimmed off. Fish and seafood. Egg whites. Dried beans, peas, or lentils. Unsalted nuts, nut butters, and seeds. Unsalted canned beans. Lean cuts of beef with fat trimmed off. Low-sodium, lean deli meat. Dairy  Low-fat (1%) or fat-free (skim) milk. Fat-free, low-fat, or reduced-fat cheeses. Nonfat, low-sodium ricotta or cottage cheese. Low-fat or nonfat yogurt. Low-fat, low-sodium cheese. Fats and oils  Soft margarine without trans fats. Vegetable oil. Low-fat, reduced-fat, or light mayonnaise and salad dressings (reduced-sodium). Canola, safflower, olive, soybean, and sunflower oils. Avocado. Seasoning and other foods  Herbs. Spices. Seasoning mixes without salt. Unsalted popcorn and pretzels. Fat-free sweets. What foods are not recommended? The items listed may not be a complete list. Talk with your dietitian about what dietary choices are best for you. Grains  Baked goods made with fat, such as croissants, muffins, or some breads. Dry pasta or rice meal packs. Vegetables  Creamed or fried vegetables. Vegetables in a cheese sauce. Regular canned vegetables (not low-sodium or reduced-sodium). Regular canned tomato sauce and paste (not low-sodium or reduced-sodium). Regular tomato and vegetable juice (not low-sodium or reduced-sodium). Angie Fava. Olives. Fruits  Canned fruit in a light or heavy syrup. Fried fruit. Fruit in cream or butter sauce. Meat and other protein foods  Fatty cuts of meat. Ribs. Fried meat. Berniece Salines. Sausage. Bologna and other processed lunch meats. Salami. Fatback. Hotdogs. Bratwurst. Salted nuts and seeds. Canned beans with added salt. Canned or smoked fish. Whole eggs or egg yolks. Chicken  or [place] with skin. Dairy  Whole or 2% milk, cream, and half-and-half. Whole or full-fat cream cheese. Whole-fat or sweetened yogurt. Full-fat  cheese. Nondairy creamers. Whipped toppings. Processed cheese and cheese spreads. Fats and oils  Butter. Stick margarine. Lard. Shortening. Ghee. Bacon fat. Tropical oils, such as coconut, palm kernel, or palm oil. Seasoning and other foods  Salted popcorn and pretzels. Onion salt, garlic salt, seasoned salt, table salt, and sea salt. Worcestershire sauce. Tartar sauce. Barbecue sauce. Teriyaki sauce. Soy sauce, including reduced-sodium. Steak sauce. Canned and packaged gravies. Fish sauce. Oyster sauce. Cocktail sauce. Horseradish that you find on the shelf. Ketchup. Mustard. Meat flavorings and tenderizers. Bouillon cubes. Hot sauce and Tabasco sauce. Premade or packaged marinades. Premade or packaged taco seasonings. Relishes. Regular salad dressings. Where to find more information:  National Heart, Lung, and [place]: https://wilson-eaton.com/  American Heart Association: www.heart.org Summary  The DASH eating plan is a healthy eating plan that has been shown to reduce high blood pressure (hypertension). It may also reduce your risk for type 2 diabetes, heart disease, and stroke.  With the DASH eating plan, you should limit salt (sodium) intake to 2,300 mg a day. If you have hypertension, you may need to reduce your sodium intake to 1,500 mg a day.  When on the DASH eating plan, aim to eat more fresh fruits and vegetables, whole grains, lean proteins, low-fat dairy, and heart-healthy fats.  Work with your health care provider or diet and nutrition specialist (dietitian) to adjust your eating plan to your individual calorie needs. This information is not intended to replace advice given to you by your health care provider. Make sure you discuss any questions you have with your health care provider. Document Released: 05/10/2011 Document Revised:  05/14/2016 Document Reviewed: 05/14/2016 Elsevier Interactive Patient Education  2017 [place] for Gastroesophageal Reflux Disease, Adult When you have gastroesophageal reflux disease (GERD), the foods you eat and your eating habits are very important. Choosing the right foods can help ease your discomfort. What guidelines do I need to follow?  Choose fruits, vegetables, whole grains, and low-fat dairy products.  Choose low-fat meat, fish, and poultry.  Limit fats such as oils, salad dressings, butter, nuts, and avocado.  Keep a food diary. This helps you identify foods that cause symptoms.  Avoid foods that cause symptoms. These may be different for everyone.  Eat small meals often instead of 3 large meals a day.  Eat your meals slowly, in a place where you are relaxed.  Limit fried foods.  Cook foods using methods other than frying.  Avoid drinking alcohol.  Avoid drinking large amounts of liquids with your meals.  Avoid bending over or lying down until 2-3 hours after eating. What foods are not recommended? These are some foods and drinks that may make your symptoms worse: Vegetables  Tomatoes. Tomato juice. Tomato and spaghetti sauce. Chili peppers. Onion and garlic. Horseradish. Fruits  Oranges, grapefruit, and lemon (fruit and juice). Meats  High-fat meats, fish, and poultry. This includes hot dogs, ribs, ham, sausage, salami, and bacon. Dairy  Whole milk and chocolate milk. Sour cream. Cream. Butter. Ice cream. Cream cheese. Drinks  Coffee and tea. Bubbly (carbonated) drinks or energy drinks. Condiments  Hot sauce. Barbecue sauce. Sweets/Desserts  Chocolate and cocoa. Donuts. Peppermint and spearmint. Fats and Oils  High-fat foods. This includes [place] fries and potato chips. Other  Vinegar. Strong spices. This includes black pepper, white pepper, red pepper, cayenne, curry powder, cloves, ginger, and chili powder. The items listed above  may not be a complete list of foods and drinks to avoid. Contact your  dietitian for more information.  This information is not intended to replace advice given to you by your health care provider. Make sure you discuss any questions you have with your health care provider. Document Released: 11/20/2011 Document Revised: 10/27/2015 Document Reviewed: 03/25/2013 Elsevier Interactive Patient Education  2017 [employer].

## 2016-08-16 NOTE — Progress Notes (Signed)
PRIMARY CARE AT [facility] [address], [place] [address] [phone]  Date:  08/16/2016   Name:  Marilyn [name]   DOB:  01-23-1966   MRN:  [id number]  PCP:  Ivar Drape, PA   History of Present Illness:  Marilyn [name] is a 51 y.o. female patient who presents to PCP with  Chief Complaint  Patient presents with  . Follow-up    cough/ back pain, right leg pain     Patient states she continues to have hoarseness, and coughing.  It has improved some.  2 weeks of symptopms without fever.  Dry cough.  No sob or dyspnea.   She had a goiter and this was removed.  She was seeing Dr. Barbaraann Rondo, endocrinologist.  She has been taking the levothyroxine compliantly.  She has cp, constipation, and hair loss.   Dyspnea on exertion, but is currently not exercising.  This can occur with walking across the parking lot.  Low back pain progressively worsens during the day but has improved since the last visit.  She will take flexeril once per day.  No radiating numbness or tingling.   gerd: eating tums at this time.  She needs a refill of the prevacid.     Vitamin D deficiency, she reports.  Currently taking vit d3 5000 daily.  She has not had this rechecked. Currently taking the amlodipine, but no hctz for several months.  She has no cp with exertion, sob, or palpitations.  Notices some swelling to her toes, but not at this time.  Does not watch sodium intake.  She will eat [employer].    Patient Active Problem List   Diagnosis Date Noted  . Nausea & vomiting 07/16/2015  . RUQ abdominal pain 07/16/2015  . RLQ abdominal pain 07/15/2015  . Pain in lower back 11/30/2014  . Acute upper respiratory infection 05/05/2014  . Allergic rhinitis 05/05/2014  . Hoarseness 05/05/2014  . Dysphagia 05/05/2014  . Obesity 04/11/2014  . Edema, peripheral 04/11/2014  . Cough 12/20/2012  . Hypersomnolence 12/15/2012  . DOE (dyspnea on exertion) 12/15/2012  . Right shoulder pain 12/15/2012  .  Abdominal pain 11/15/2011  . HTN (hypertension) 09/09/2011  . Neck pain 09/09/2011  . Preventative health care 04/30/2011  . HOT FLASHES 12/22/2009  . CHEST PAIN 01/29/2009  . HYPERTHYROIDISM 01/31/2007  . GERD 01/31/2007  . Hypothyroidism 01/30/2007  . Hyperlipidemia 01/30/2007  . Iron deficiency anemia 01/30/2007  . ASTHMA 01/30/2007    Past Medical History:  Diagnosis Date  . 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/2007  . ASTHMA 01/30/2007  . Breast tumor    right  . GERD 01/31/2007  . Hemorrhoids   . Hiatal hernia   . HTN (hypertension) 09/09/2011  . Hyperlipidemia   . HYPOTHYROIDISM 01/30/2007  . Mallory Mariel Kansky tear 04/2003    Past Surgical History:  Procedure Laterality Date  . BREAST LUMPECTOMY WITH RADIOACTIVE SEED LOCALIZATION Right 02/25/2015   Procedure: BREAST LUMPECTOMY WITH RADIOACTIVE SEED LOCALIZATION;  Surgeon: Alphonsa Overall, MD;  Location: [employer];  Service: General;  Laterality: Right;  . BREAST REDUCTION SURGERY  1986  . broken jaw    . CARPAL TUNNEL RELEASE  2001  . THYROIDECTOMY    . TUBAL LIGATION  1987    Social History  Substance Use Topics  . Smoking status: Former Smoker    Packs/day: 0.25    Years: 3.00    Types: Cigarettes    Quit date: 06/04/1980  . Smokeless tobacco:  Never Used  . Alcohol use No    Family History  Problem Relation Age of Onset  . Hypertension Father   . Diabetes Father   . Asthma Mother   . Breast cancer Sister   . Diabetes Maternal Grandmother   . Heart disease Maternal Grandmother   . Asthma Maternal Grandfather   . Colon cancer Neg Hx     Allergies  Allergen Reactions  . Tuna [Fish Allergy] Anaphylaxis  . Codeine Nausea And Vomiting  . Flonase [Fluticasone Propionate] Other (See Comments)    Nose bleeds    Medication list has been reviewed and updated.  Current Outpatient Prescriptions on File Prior to Visit  Medication Sig Dispense Refill  . acetaminophen (TYLENOL) 325 MG tablet Take 325 mg  by mouth every 6 (six) hours as needed for moderate pain.    [name] albuterol (PROVENTIL HFA;VENTOLIN HFA) 108 (90 Base) MCG/ACT inhaler Inhale 2 puffs into the lungs every 4 (four) hours as needed for wheezing or shortness of breath (cough, shortness of breath or wheezing.). 1 Inhaler 1  . amLODipine (NORVASC) 10 MG tablet TAKE ONE TABLET BY MOUTH ONCE DAILY 90 tablet 3  . amLODipine (NORVASC) 10 MG tablet TAKE ONE TABLET BY MOUTH ONCE DAILY 90 tablet 2  . amLODipine (NORVASC) 10 MG tablet TAKE ONE TABLET BY MOUTH DAILY 90 tablet 1  . aspirin 325 MG tablet Take 325 mg by mouth daily.    . Cholecalciferol (VITAMIN D3) 5000 UNITS CAPS Take 1 capsule by mouth 2 (two) times a week.    . Cyanocobalamin (VITAMIN B 12 PO) Take 1 tablet by mouth daily.    . cyclobenzaprine (FLEXERIL) 10 MG tablet Take 0.5-1 tablets (5-10 mg total) by mouth 3 (three) times daily as needed. 30 tablet 0  . cyclobenzaprine (FLEXERIL) 5 MG tablet Take 1 tablet (5 mg total) by mouth 3 (three) times daily as needed for muscle spasms. 30 tablet 0  . EPINEPHrine 0.3 mg/0.3 mL IJ SOAJ injection Inject 0.3 mLs (0.3 mg total) into the muscle once. 1 Device 0  . hydrochlorothiazide (MICROZIDE) 12.5 MG capsule Take 1 capsule (12.5 mg total) by mouth daily. As needed for swelling 90 capsule 3  . hydroxypropyl methylcellulose / hypromellose (ISOPTO TEARS / GONIOVISC) 2.5 % ophthalmic solution Place 1 drop into both eyes at bedtime.    . lansoprazole (PREVACID) 30 MG capsule Take 1 capsule (30 mg total) by mouth 2 (two) times daily before a meal. 60 capsule 11  . levothyroxine (SYNTHROID, LEVOTHROID) 125 MCG tablet TAKE ONE TABLET BY MOUTH ONCE DAILY BEFORE  BREAKFAST 90 tablet 3  . loratadine (CLARITIN) 10 MG tablet Take 10 mg by mouth daily as needed for allergies.    . Multiple Vitamin (MULTIVITAMIN WITH MINERALS) TABS tablet Take 1 tablet by mouth daily.    . polyvinyl alcohol (LIQUIFILM TEARS) 1.4 % ophthalmic solution Place 1 drop into  both eyes at bedtime. 15 mL 0  . atorvastatin (LIPITOR) 20 MG tablet Take 1 tablet (20 mg total) by mouth daily. 90 tablet 1  . HYDROcodone-acetaminophen (NORCO) 7.5-325 MG tablet Take 1 tablet by mouth every 6 (six) hours as needed for moderate pain. (Patient not taking: Reported on 08/16/2016) 40 tablet 0  . oxyCODONE-acetaminophen (PERCOCET) 7.5-325 MG tablet Take 1 tablet by mouth every 4 (four) hours. (Patient not taking: Reported on 08/16/2016) 30 tablet 0   No current facility-administered medications on file prior to visit.     ROS ROS otherwise unremarkable unless  listed above.  Physical Examination: BP 120/84   Pulse 91   Temp 98.5 F (36.9 C) (Oral)   Resp 16   Ht '5\' 4"'  (1.626 m)   Wt 197 lb (89.4 kg)   LMP 03/18/2014   SpO2 97%   BMI 33.81 kg/m  Ideal Body Weight: Weight in (lb) to have BMI = 25: 145.3  Physical Exam  Constitutional: She is oriented to person, place, and time. She appears well-developed and well-nourished. No distress.  HENT:  Head: Normocephalic and atraumatic.  Right Ear: External ear normal.  Left Ear: External ear normal.  Eyes: Conjunctivae and EOM are normal. Pupils are equal, round, and reactive to light.  Cardiovascular: Normal rate, regular rhythm and normal heart sounds.  Exam reveals no gallop and no friction rub.   No murmur heard. Pulses:      Radial pulses are 2+ on the right side, and 2+ on the left side.       Dorsalis pedis pulses are 2+ on the right side, and 2+ on the left side.  Pulmonary/Chest: Effort normal and breath sounds normal. No apnea. No respiratory distress. She has no decreased breath sounds. She has no wheezes. She has no rhonchi.  Musculoskeletal:       Right shoulder: She exhibits tenderness (lower left musculature).  Neurological: She is alert and oriented to person, place, and time.  Skin: She is not diaphoretic.  Psychiatric: She has a normal mood and affect. Her behavior is normal.     Assessment and  Plan: Marilyn [name] is a 51 y.o. female who is here today for cc of vitamin d deficiency, thyroid issues, medication refills,  Marilyn [name] was seen today for follow-up.  Diagnoses and all orders for this visit:  Vitamin D deficiency Recheck today -     VITAMIN D 25 Hydroxy (Vit-D Deficiency, Fractures)  Hypothyroidism, unspecified type We will recheck today, and refill pending lab result -     Thyroid Panel With TSH  Multiple allergies Refill given today - EPINEPHrine 0.3 mg/0.3 mL IJ SOAJ injection; Inject 0.3 mLs (0.3 mg total) into the muscle once.  Hyperlipidemia, unspecified hyperlipidemia type -     Lipid panel -     atorvastatin (LIPITOR) 20 MG tablet; Take 1 tablet (20 mg total) by mouth daily.  Essential hypertension Stable.  We will not refill the hctz at this time.  If she notices lower leg swelling, or increase in bp, will return, otherwise follow up in 6 months -     CMP14+EGFR -     amLODipine (NORVASC) 10 MG tablet; TAKE ONE TABLET BY MOUTH ONCE DAILY  Gastroesophageal reflux disease, esophagitis presence not specified --will refill.  Encouraged gerd friendly diet. -     lansoprazole (PREVACID) 30 MG capsule; Take 1 capsule (30 mg total) by mouth 2 (two) times daily before a meal.  Dysphagia, pharyngoesophageal phase -     lansoprazole (PREVACID) 30 MG capsule; Take 1 capsule (30 mg total) by mouth 2 (two) times daily before a meal. Chronic left-sided low back pain without sciatica  We will continue regimen of msk relaxants.  If she continues to have the sxs she will contact, and ortho referral will be given

## 2016-08-17 LAB — CMP14+EGFR
ALT: 14 IU/L (ref 0–32)
AST: 17 IU/L (ref 0–40)
Albumin/Globulin Ratio: 1.3 (ref 1.2–2.2)
Albumin: 4.5 g/dL (ref 3.5–5.5)
Alkaline Phosphatase: 80 IU/L (ref 39–117)
BUN/Creatinine Ratio: 20 (ref 9–23)
BUN: 15 mg/dL (ref 6–24)
Bilirubin Total: <0.2 mg/dL (ref 0.0–1.2)
CO2: 23 mmol/L (ref 18–29)
Calcium: 9.5 mg/dL (ref 8.7–10.2)
Chloride: 99 mmol/L (ref 96–106)
Creatinine, Ser: 0.75 mg/dL (ref 0.57–1.00)
GFR calc Af Amer: 106 mL/min/{1.73_m2} (ref >59)
GFR calc non Af Amer: 92 mL/min/{1.73_m2} (ref >59)
Globulin, Total: 3.5 g/dL (ref 1.5–4.5)
Glucose: 93 mg/dL (ref 65–99)
Potassium: 4.4 mmol/L (ref 3.5–5.2)
Sodium: 143 mmol/L (ref 134–144)
Total Protein: 8 g/dL (ref 6.0–8.5)

## 2016-08-17 LAB — LIPID PANEL
Chol/HDL Ratio: 4.6 ratio units — ABNORMAL HIGH (ref 0.0–4.4)
Cholesterol, Total: 223 mg/dL — ABNORMAL HIGH (ref 100–199)
HDL: 49 mg/dL (ref >39)
LDL Calculated: 143 mg/dL — ABNORMAL HIGH (ref 0–99)
Triglycerides: 153 mg/dL — ABNORMAL HIGH (ref 0–149)
VLDL Cholesterol Cal: 31 mg/dL (ref 5–40)

## 2016-08-17 LAB — THYROID PANEL WITH TSH
Free Thyroxine Index: 2.2 (ref 1.2–4.9)
T3 Uptake Ratio: 24 % (ref 24–39)
T4, Total: 9.3 ug/dL (ref 4.5–12.0)
TSH: 0.639 u[IU]/mL (ref 0.450–4.500)

## 2016-08-17 LAB — VITAMIN D 25 HYDROXY (VIT D DEFICIENCY, FRACTURES): Vit D, 25-Hydroxy: 26.5 ng/mL — ABNORMAL LOW (ref 30.0–100.0)

## 2016-08-27 ENCOUNTER — Other Ambulatory Visit: Payer: Self-pay | Admitting: Physician Assistant

## 2016-08-27 MED ORDER — ATORVASTATIN CALCIUM 40 MG PO TABS: 40.0000 mg | ORAL_TABLET | Freq: Every day | ORAL | 3 refills | Status: DC

## 2016-08-27 MED ORDER — ERGOCALCIFEROL 1.25 MG (50000 UT) PO CAPS: 50000.0000 [IU] | ORAL_CAPSULE | ORAL | 0 refills | Status: DC

## 2016-08-28 ENCOUNTER — Telehealth: Payer: Self-pay | Admitting: Physician Assistant

## 2016-08-28 NOTE — Telephone Encounter (Signed)
Pt missed call. Please advise at [phone]

## 2016-08-31 NOTE — Telephone Encounter (Signed)
l/m with results and med changes

## 2016-09-06 ENCOUNTER — Telehealth: Payer: Self-pay | Admitting: Internal Medicine

## 2016-09-06 ENCOUNTER — Other Ambulatory Visit: Payer: Self-pay | Admitting: Physician Assistant

## 2016-09-06 DIAGNOSIS — K219 Gastro-esophageal reflux disease without esophagitis: Secondary | ICD-10-CM

## 2016-09-06 DIAGNOSIS — J310 Chronic rhinitis: Secondary | ICD-10-CM

## 2016-09-06 DIAGNOSIS — R1314 Dysphagia, pharyngoesophageal phase: Secondary | ICD-10-CM

## 2016-09-06 DIAGNOSIS — E038 Other specified hypothyroidism: Secondary | ICD-10-CM

## 2016-09-06 DIAGNOSIS — J302 Other seasonal allergic rhinitis: Secondary | ICD-10-CM

## 2016-09-06 MED ORDER — BECLOMETHASONE DIPROPIONATE 80 MCG/ACT NA AERS: 2.0000 | INHALATION_SPRAY | Freq: Every day | NASAL | 11 refills | Status: DC

## 2016-09-06 MED ORDER — LEVOTHYROXINE SODIUM 125 MCG PO TABS: 125.0000 ug | ORAL_TABLET | Freq: Every day | ORAL | 3 refills | Status: DC

## 2016-09-06 MED ORDER — LANSOPRAZOLE 30 MG PO CPDR: 30.0000 mg | DELAYED_RELEASE_CAPSULE | Freq: Two times a day (BID) | ORAL | 11 refills | Status: DC

## 2016-09-08 NOTE — Telephone Encounter (Signed)
qnasal not covered Preferred  dymista

## 2016-09-09 MED ORDER — AZELASTINE-FLUTICASONE 137-50 MCG/ACT NA SUSP
2.0000 | Freq: Every day | NASAL | 5 refills | Status: DC
Start: 2016-09-09 — End: 2017-01-23

## 2016-10-03 ENCOUNTER — Telehealth: Payer: Self-pay | Admitting: Physician Assistant

## 2016-10-03 NOTE — Telephone Encounter (Signed)
Patient has new complaint of rashing bumps that are described as small bumps that swell and are itchy and painful to red nodules.  She has no sob or dyspnea.  No oral swelling.  She has dropped it to the 20mg  again, but has discontinued for 1 week when the rash had not cleared.   Advised for her to return in 3 days if her symptoms do not improve.  They have erythema nodosum sound, which may not be the cause of lipitor.  This was discussed with patient. She voiced understanding. She will not take the medication any longer and return to clinic

## 2016-10-03 NOTE — Telephone Encounter (Signed)
PATIENT STATES STEPHANIE PRESCRIBED HER TO HAVE ATORVASTATIN (LIPITOR) 40 mg IN [facility]. IT WAS TOO STRONG FOR HER SO STEPHANIE TOLD HER TO TAKE A HALF PILL. SHE HAS BEEN BREAKING OUT IN [place] SO SHE STOPPED TAKING IT LAST Friday. THE WHELPS WAS ON HER CHEST BUT NOW THEY ARE ON HER ARMS, WRIST, SIDE AND LEG. SHE HAS BEEN USING BENADRYL FOR THE [facility] AND OVER-THE-COUNTER CORTISONE 10. WHAT ELSE SHOULD SHE DO? 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 IF WE CAN NOT CALL HER THEN PLEASE TRY AGAIN AFTER 3:00 pm. PHARMACY CHOICE IS [facility] [place]

## 2016-10-03 NOTE — Telephone Encounter (Signed)
Colletta Maryland spoke to patient via phone. Message routed

## 2016-10-09 ENCOUNTER — Encounter: Payer: Self-pay | Admitting: Physician Assistant

## 2016-10-09 ENCOUNTER — Ambulatory Visit (INDEPENDENT_AMBULATORY_CARE_PROVIDER_SITE_OTHER): Payer: BC Managed Care – PPO | Admitting: Physician Assistant

## 2016-10-09 VITALS — BP 117/79 | HR 104 | Temp 98.8°F | Resp 17 | Ht 64.0 in | Wt 197.0 lb

## 2016-10-09 DIAGNOSIS — L282 Other prurigo: Secondary | ICD-10-CM | POA: Diagnosis not present

## 2016-10-09 MED ORDER — TRIAMCINOLONE ACETONIDE 0.1 % EX CREA: 1.0000 "application " | TOPICAL_CREAM | Freq: Two times a day (BID) | CUTANEOUS | 0 refills | Status: DC

## 2016-10-09 MED ORDER — RANITIDINE HCL 150 MG PO TABS: 150.0000 mg | ORAL_TABLET | Freq: Two times a day (BID) | ORAL | 1 refills | Status: DC

## 2016-10-09 NOTE — Patient Instructions (Addendum)
Please take the medication as prescribed. Wash all of your clothes and linens with warm soapy water.  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   Rash A rash is a change in the color of the skin. A rash can also change the way your skin feels. There are many different conditions and factors that can cause a rash. Follow these instructions at home: Pay attention to any changes in your symptoms. Follow these instructions to help with your condition: Medicine  Take or apply over-the-counter and prescription medicines only as told by your doctor. These may include:  Corticosteroid cream.  Anti-itch lotions.  Oral antihistamines. Skin Care   Put cool compresses on the affected areas.  Try taking a bath with:  Epsom salts. Follow the instructions on the packaging. You can get these at your local pharmacy or grocery store.  Baking soda. Pour a small amount into the bath as told by your doctor.  Colloidal oatmeal. Follow the instructions on the packaging. You can get this at your local pharmacy or grocery store.  Try putting baking soda paste onto your skin. Stir water into baking soda until it gets like a paste.  Do not scratch or rub your skin.  Avoid covering the rash. Make sure the rash is exposed to air as much as possible. General instructions   Avoid hot showers or baths, which can make itching worse. A cold shower may help.  Avoid scented soaps, detergents, and perfumes. Use gentle soaps, detergents, perfumes, and other cosmetic products.  Avoid anything that causes your rash. Keep a journal to help track what causes your rash. Write down:  What you eat.  What cosmetic products you use.  What you drink.  What you wear. This includes jewelry.  Keep all follow-up visits as told by your doctor. This is important. Contact a doctor if:  You sweat at night.  You lose weight.  You pee (urinate) more than  normal.  You feel weak.  You throw up (vomit).  Your skin or the whites of your eyes look yellow (jaundice).  Your skin:  Tingles.  Is numb.  Your rash:  Does not go away after a few days.  Gets worse.  You are:  More thirsty than normal.  More tired than normal.  You have:  New symptoms.  Pain in your belly (abdomen).  A fever.  Watery poop (diarrhea). Get help right away if:  Your rash covers all or most of your body. The rash may or may not be painful.  You have blisters that:  Are on top of the rash.  Grow larger.  Grow together.  Are painful.  Are inside your nose or mouth.  You have a rash that:  Looks like purple pinprick-sized spots all over your body.  Has a "bull's eye" or looks like a target.  Is red and painful, causes your skin to peel, and is not from being in the sun too long. This information is not intended to replace advice given to you by your health care provider. Make sure you discuss any questions you have with your health care provider. Document Released: 11/07/2007 Document Revised: 10/27/2015 Document Reviewed: 10/06/2014 Elsevier Interactive Patient Education  2017 [employer].     IF you received an x-ray today, you will receive an invoice from [facility]. Please contact [facility] at [phone] with questions or concerns regarding your invoice.   IF you received  labwork today, you will receive an invoice from [facility]. Please contact [facility] at [phone] with questions or concerns regarding your invoice.   Our billing staff will not be able to assist you with questions regarding bills from these companies.  You will be contacted with the lab results as soon as they are available. The fastest way to get your results is to activate your My Chart account. Instructions are located on the last page of this paperwork. If you have not heard from [place] regarding the results in 2 weeks, please contact  this office.    We recommend that you schedule a mammogram for breast cancer screening. Typically, you do not need a referral to do this. Please contact a local imaging center to schedule your mammogram.  [facility] - [phone]  *ask for the Radiology Department [facility] - [phone] or [phone]  [facility] - [phone] [place] [phone] [facility] - [phone]  *ask for the [facility] - [phone]  *ask for the Radiology Department [facility] - [phone]  *ask for the [place] - [phone]

## 2016-10-09 NOTE — Progress Notes (Signed)
PRIMARY CARE AT [facility] [address] [place] [address] [phone]  Date:  10/09/2016   Name:  Marilyn [name]   DOB:  02-09-1966   MRN:  [id number]  PCP:  Joretta Bachelor, PA    History of Present Illness:  Marilyn [name] is a 51 y.o. female patient who presents to PCP with  Chief Complaint  Patient presents with  . Rash    Follow up/ places on face onset this am     pateint reports migratory rash that has occurred on the lower left leg, and eye, and arm.  They are very pruritic.  She is not outdoors.  No one in the home is complaining of similar rash.  No fevers.  No ur symptoms.     Patient Active Problem List   Diagnosis Date Noted  . Nausea & vomiting 07/16/2015  . RUQ abdominal pain 07/16/2015  . RLQ abdominal pain 07/15/2015  . Pain in lower back 11/30/2014  . Acute upper respiratory infection 05/05/2014  . Allergic rhinitis 05/05/2014  . Hoarseness 05/05/2014  . Dysphagia 05/05/2014  . Obesity 04/11/2014  . Edema, peripheral 04/11/2014  . Cough 12/20/2012  . Hypersomnolence 12/15/2012  . DOE (dyspnea on exertion) 12/15/2012  . Right shoulder pain 12/15/2012  . Abdominal pain 11/15/2011  . HTN (hypertension) 09/09/2011  . Neck pain 09/09/2011  . Preventative health care 04/30/2011  . HOT FLASHES 12/22/2009  . CHEST PAIN 01/29/2009  . HYPERTHYROIDISM 01/31/2007  . GERD 01/31/2007  . Hypothyroidism 01/30/2007  . Hyperlipidemia 01/30/2007  . Iron deficiency anemia 01/30/2007  . ASTHMA 01/30/2007    Past Medical History:  Diagnosis Date  . 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/2007  . ASTHMA 01/30/2007  . Breast tumor    right  . GERD 01/31/2007  . Hemorrhoids   . Hiatal hernia   . HTN (hypertension) 09/09/2011  . Hyperlipidemia   . HYPOTHYROIDISM 01/30/2007  . Mallory Mariel Kansky tear 04/2003    Past Surgical History:  Procedure Laterality Date  . BREAST LUMPECTOMY WITH RADIOACTIVE SEED LOCALIZATION Right 02/25/2015   Procedure: BREAST LUMPECTOMY WITH  RADIOACTIVE SEED LOCALIZATION;  Surgeon: Alphonsa Overall, MD;  Location: [facility];  Service: General;  Laterality: Right;  . BREAST REDUCTION SURGERY  1986  . broken jaw    . CARPAL TUNNEL RELEASE  2001  . THYROIDECTOMY    . TUBAL LIGATION  1987    Social History  Substance Use Topics  . Smoking status: Former Smoker    Packs/day: 0.25    Years: 3.00    Types: Cigarettes    Quit date: 06/04/1980  . Smokeless tobacco: Never Used  . Alcohol use No    Family History  Problem Relation Age of Onset  . Hypertension Father   . Diabetes Father   . Asthma Mother   . Breast cancer Sister   . Diabetes Maternal Grandmother   . Heart disease Maternal Grandmother   . Asthma Maternal Grandfather   . Colon cancer Neg Hx     Allergies  Allergen Reactions  . Tuna [Fish Allergy] Anaphylaxis  . Codeine Nausea And Vomiting  . Flonase [Fluticasone Propionate] Other (See Comments)    Nose bleeds    Medication list has been reviewed and updated.  Current Outpatient Prescriptions on File Prior to Visit  Medication Sig Dispense Refill  . acetaminophen (TYLENOL) 325 MG tablet Take 325 mg by mouth every 6 (six) hours as needed for moderate pain.    [name] albuterol (  PROVENTIL HFA;VENTOLIN HFA) 108 (90 Base) MCG/ACT inhaler Inhale 2 puffs into the lungs every 4 (four) hours as needed for wheezing or shortness of breath (cough, shortness of breath or wheezing.). 1 Inhaler 1  . amLODipine (NORVASC) 10 MG tablet TAKE ONE TABLET BY MOUTH ONCE DAILY 90 tablet 3  . aspirin 325 MG tablet Take 325 mg by mouth daily.    . Azelastine-Fluticasone (DYMISTA) 137-50 MCG/ACT SUSP Place 2 puffs into the nose daily. 23 g 5  . Beclomethasone Dipropionate 80 MCG/ACT AERS Place 2 puffs into the nose daily. 8.7 g 11  . Cholecalciferol (VITAMIN D3) 5000 UNITS CAPS Take 1 capsule by mouth 2 (two) times a week.    . Cyanocobalamin (VITAMIN B 12 PO) Take 1 tablet by mouth daily.    . ergocalciferol (DRISDOL)  50000 units capsule Take 1 capsule (50,000 Units total) by mouth once a week. 12 capsule 0  . hydroxypropyl methylcellulose / hypromellose (ISOPTO TEARS / GONIOVISC) 2.5 % ophthalmic solution Place 1 drop into both eyes at bedtime.    . lansoprazole (PREVACID) 30 MG capsule Take 1 capsule (30 mg total) by mouth 2 (two) times daily before a meal. 60 capsule 11  . levothyroxine (SYNTHROID, LEVOTHROID) 125 MCG tablet Take 1 tablet (125 mcg total) by mouth daily before breakfast. 90 tablet 3  . loratadine (CLARITIN) 10 MG tablet Take 10 mg by mouth daily as needed for allergies.    . Multiple Vitamin (MULTIVITAMIN WITH MINERALS) TABS tablet Take 1 tablet by mouth daily.    . polyvinyl alcohol (LIQUIFILM TEARS) 1.4 % ophthalmic solution Place 1 drop into both eyes at bedtime. 15 mL 0  . atorvastatin (LIPITOR) 40 MG tablet Take 1 tablet (40 mg total) by mouth daily. (Patient not taking: Reported on 10/09/2016) 90 tablet 3  . cyclobenzaprine (FLEXERIL) 10 MG tablet Take 0.5-1 tablets (5-10 mg total) by mouth 3 (three) times daily as needed. (Patient not taking: Reported on 10/09/2016) 30 tablet 0  . hydrochlorothiazide (MICROZIDE) 12.5 MG capsule Take 1 capsule (12.5 mg total) by mouth daily. As needed for swelling (Patient not taking: Reported on 10/09/2016) 90 capsule 3   No current facility-administered medications on file prior to visit.     ROS ROS otherwise unremarkable unless listed above.  Physical Examination: BP 117/79 (BP Location: Right Arm, Patient Position: Sitting, Cuff Size: Large)   Pulse (!) 104   Temp 98.8 F (37.1 C) (Oral)   Resp 17   Ht 5\' 4"  (1.626 m)   Wt 197 lb (89.4 kg)   LMP 03/18/2014   SpO2 95%   BMI 33.81 kg/m  Ideal Body Weight: Weight in (lb) to have BMI = 25: 145.3  Physical Exam  Constitutional: She is oriented to person, place, and time. She appears well-developed and well-nourished. No distress.  HENT:  Head: Normocephalic and atraumatic.  Right Ear: External  ear normal.  Left Ear: External ear normal.  Eyes: Conjunctivae and EOM are normal. Pupils are equal, round, and reactive to light.  Left lower eyelid with lateral raised swelling.   Mildly erythematous raised lesions in pair at her left lower leg Sparing of flexor areas and interweb spaces.  Cardiovascular: Normal rate.   Pulmonary/Chest: Effort normal. No respiratory distress.  Neurological: She is alert and oriented to person, place, and time.  Skin: She is not diaphoretic.  Psychiatric: She has a normal mood and affect. Her behavior is normal.     Assessment and Plan: Marilyn [name] is  a 51 y.o. female who is here today for cc of pruritic rash.  Possible flea Likely some sort of bite.  Advised to clean linens.  Also inquire of children of rash lesions, carpets, and chair in the classroom. Continue 2nd gen anti-histamine at home Topical steroid, and h2 blocker given Advised to return if sxs do not improve in 1 week. Pruritic rash - Plan: triamcinolone cream (KENALOG) 0.1 %, Sedimentation Rate  Ivar Drape, PA-C [facility] 5/11/20189:00 AM

## 2016-10-10 LAB — SEDIMENTATION RATE: SED RATE: 11 mm/h (ref 0–40)

## 2016-10-18 IMAGING — CR DG NECK SOFT TISSUE
2 series · 2 of 2 positions shown · non-contrast
Comparison: CT neck [DATE]

CLINICAL DATA: Patient's throat started hurting a few hr ago. Now
feels like it is swollen and she is short of breath. Upper chest
pain.

EXAM:
NECK SOFT TISSUES - 1+ VIEW

[w soft tissue neck lat]
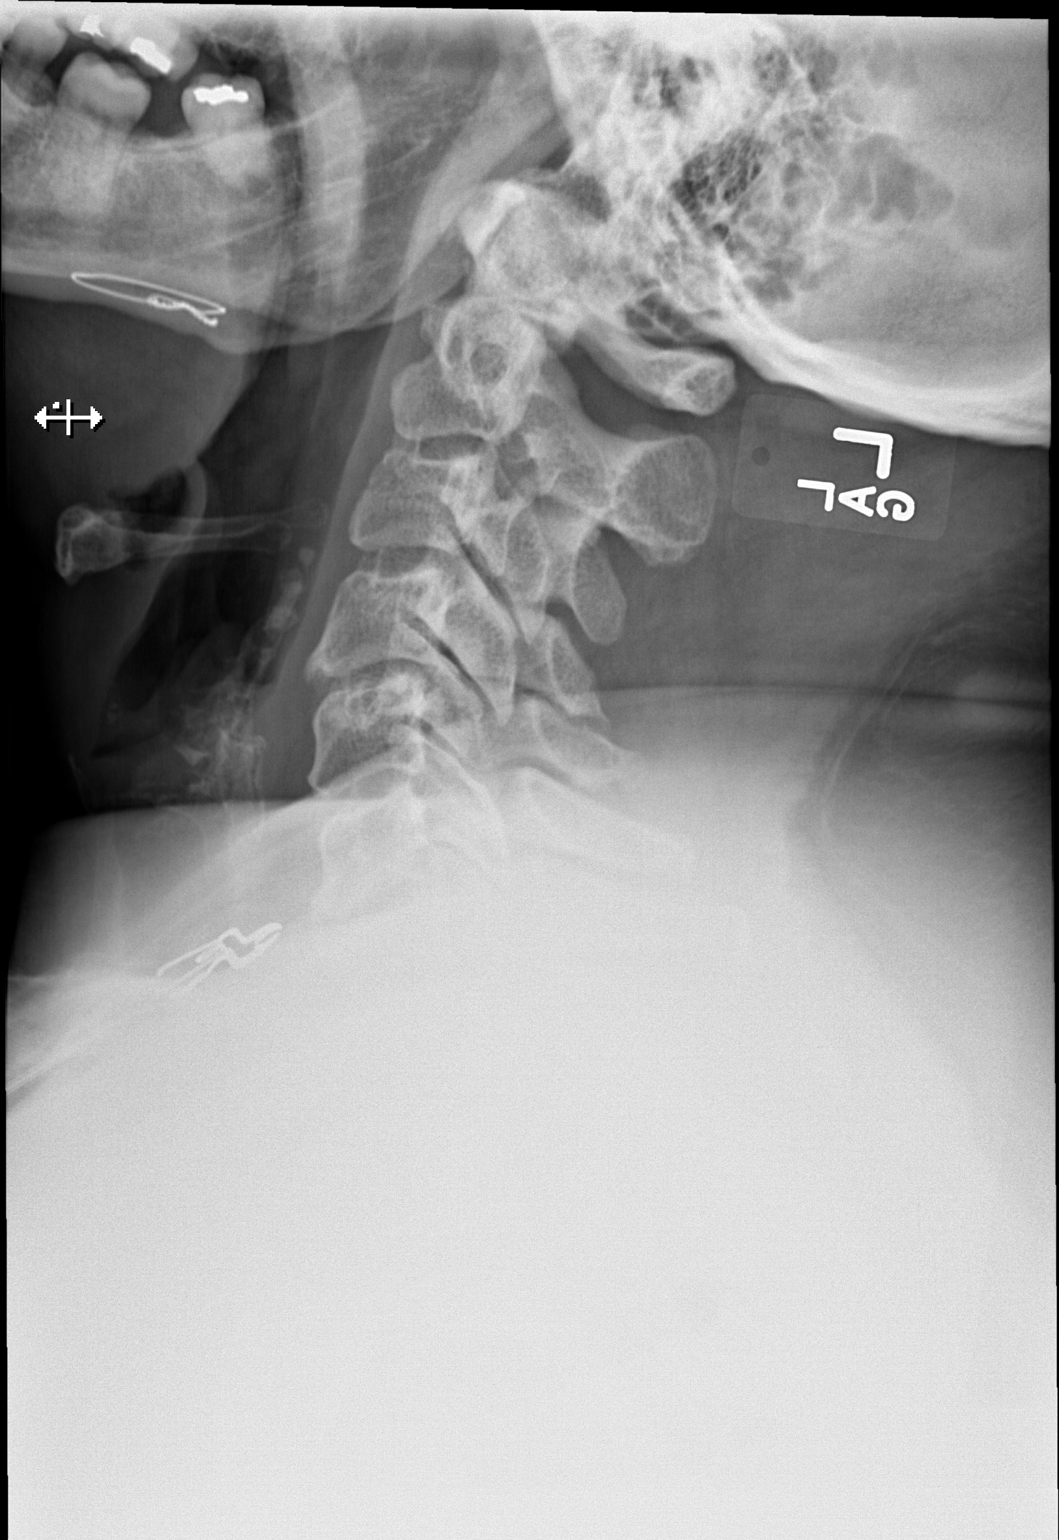

[x soft tissue neck ap]
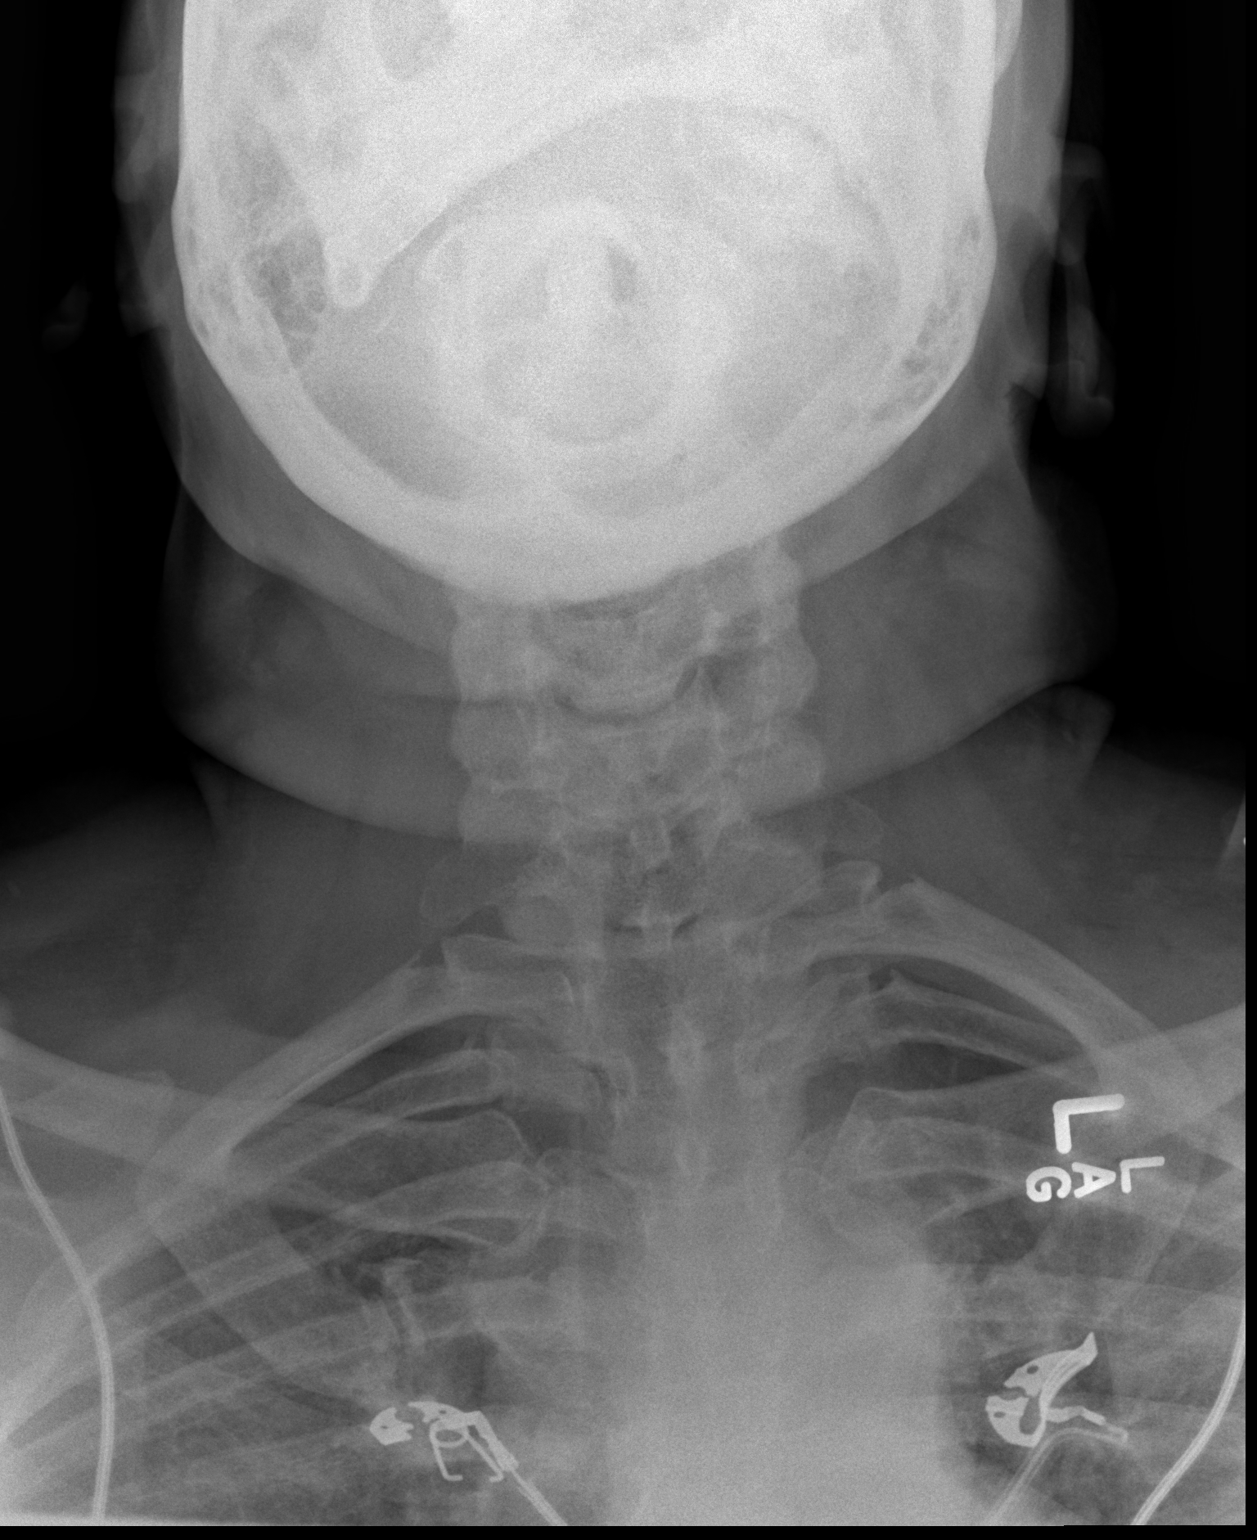

[2 of 2 positions shown; findings below may reference images not displayed]

FINDINGS: There is no evidence of retropharyngeal soft tissue swelling or
epiglottic enlargement. The cervical airway is unremarkable and no
radio-opaque foreign body identified.
IMPRESSION: Negative.

## 2016-10-23 ENCOUNTER — Encounter: Payer: Self-pay | Admitting: Radiology

## 2016-11-20 ENCOUNTER — Telehealth: Payer: Self-pay | Admitting: Physician Assistant

## 2016-11-20 NOTE — Telephone Encounter (Signed)
PATIENT CALLED BACK AGAIN. SHE WANTS STEPHANIE TO GET HER EPI PEN BEFORE SHE GETS ON THE PLANE THURS. MORNING (11/22/16). [place]

## 2016-11-20 NOTE — Telephone Encounter (Signed)
Colletta Maryland, please see this message. Thanks.

## 2016-11-20 NOTE — Telephone Encounter (Signed)
Pt is needing to talk Colletta Maryland her medical condition and flying   Best number [phone]

## 2016-11-21 MED ORDER — EPINEPHRINE 0.3 MG/0.3ML IJ SOAJ: 0.3000 mg | Freq: Once | INTRAMUSCULAR | 0 refills | Status: AC

## 2016-11-21 MED ORDER — EPINEPHRINE 0.3 MG/0.3ML IJ SOAJ: 0.3000 mg | Freq: Once | INTRAMUSCULAR | 0 refills | Status: DC

## 2016-11-21 NOTE — Telephone Encounter (Signed)
Spoke with patient regarding refill of epi-pen which has expired.  Refilled.  Advised to follow up with physical exam.

## 2017-01-23 ENCOUNTER — Observation Stay (HOSPITAL_COMMUNITY)
Admission: EM | Admit: 2017-01-23 | Discharge: 2017-01-24 | Disposition: A | Discharge status: Home / Self Care | Payer: BC Managed Care – PPO | Attending: Family Medicine | Admitting: Family Medicine

## 2017-01-23 ENCOUNTER — Encounter (HOSPITAL_COMMUNITY): Payer: Self-pay

## 2017-01-23 ENCOUNTER — Emergency Department (HOSPITAL_COMMUNITY): Payer: BC Managed Care – PPO

## 2017-01-23 DIAGNOSIS — E038 Other specified hypothyroidism: Secondary | ICD-10-CM

## 2017-01-23 DIAGNOSIS — I10 Essential (primary) hypertension: Secondary | ICD-10-CM | POA: Insufficient documentation

## 2017-01-23 DIAGNOSIS — Z791 Long term (current) use of non-steroidal anti-inflammatories (NSAID): Secondary | ICD-10-CM | POA: Insufficient documentation

## 2017-01-23 DIAGNOSIS — I259 Chronic ischemic heart disease, unspecified: Secondary | ICD-10-CM

## 2017-01-23 DIAGNOSIS — Z79899 Other long term (current) drug therapy: Secondary | ICD-10-CM | POA: Insufficient documentation

## 2017-01-23 DIAGNOSIS — J45909 Unspecified asthma, uncomplicated: Secondary | ICD-10-CM | POA: Insufficient documentation

## 2017-01-23 DIAGNOSIS — E669 Obesity, unspecified: Secondary | ICD-10-CM | POA: Diagnosis not present

## 2017-01-23 DIAGNOSIS — Z888 Allergy status to other drugs, medicaments and biological substances status: Secondary | ICD-10-CM | POA: Insufficient documentation

## 2017-01-23 DIAGNOSIS — R0789 Other chest pain: Secondary | ICD-10-CM | POA: Diagnosis present

## 2017-01-23 DIAGNOSIS — E039 Hypothyroidism, unspecified: Secondary | ICD-10-CM | POA: Diagnosis not present

## 2017-01-23 DIAGNOSIS — R079 Chest pain, unspecified: Secondary | ICD-10-CM | POA: Diagnosis not present

## 2017-01-23 DIAGNOSIS — Z6833 Body mass index (BMI) 33.0-33.9, adult: Secondary | ICD-10-CM | POA: Insufficient documentation

## 2017-01-23 DIAGNOSIS — K219 Gastro-esophageal reflux disease without esophagitis: Secondary | ICD-10-CM | POA: Insufficient documentation

## 2017-01-23 DIAGNOSIS — R7303 Prediabetes: Secondary | ICD-10-CM | POA: Diagnosis not present

## 2017-01-23 DIAGNOSIS — E785 Hyperlipidemia, unspecified: Secondary | ICD-10-CM | POA: Diagnosis present

## 2017-01-23 DIAGNOSIS — Z91013 Allergy to seafood: Secondary | ICD-10-CM | POA: Diagnosis not present

## 2017-01-23 DIAGNOSIS — Z87891 Personal history of nicotine dependence: Secondary | ICD-10-CM | POA: Diagnosis not present

## 2017-01-23 DIAGNOSIS — E119 Type 2 diabetes mellitus without complications: Secondary | ICD-10-CM | POA: Diagnosis present

## 2017-01-23 HISTORY — DX: Chest pain, unspecified: R07.9

## 2017-01-23 HISTORY — DX: Unspecified asthma, uncomplicated: J45.909

## 2017-01-23 LAB — HEPATIC FUNCTION PANEL
ALBUMIN: 3.9 g/dL (ref 3.5–5.0)
ALK PHOS: 63 U/L (ref 38–126)
ALT: 19 U/L (ref 14–54)
AST: 24 U/L (ref 15–41)
BILIRUBIN TOTAL: 0.6 mg/dL (ref 0.3–1.2)
Bilirubin, Direct: <0.1 mg/dL — ABNORMAL LOW (ref 0.1–0.5)
TOTAL PROTEIN: 7.7 g/dL (ref 6.5–8.1)

## 2017-01-23 LAB — I-STAT TROPONIN, ED
TROPONIN I, POC: 0 ng/mL (ref 0.00–0.08)
Troponin i, poc: 0 ng/mL (ref 0.00–0.08)

## 2017-01-23 LAB — D-DIMER, QUANTITATIVE: D-Dimer, Quant: 0.77 ug/mL-FEU — ABNORMAL HIGH (ref 0.00–0.50)

## 2017-01-23 LAB — BASIC METABOLIC PANEL
ANION GAP: 8 (ref 5–15)
BUN: 10 mg/dL (ref 6–20)
CHLORIDE: 106 mmol/L (ref 101–111)
CO2: 25 mmol/L (ref 22–32)
Calcium: 8.2 mg/dL — ABNORMAL LOW (ref 8.9–10.3)
Creatinine, Ser: 0.79 mg/dL (ref 0.44–1.00)
GFR calc non Af Amer: >60 mL/min (ref >60)
GLUCOSE: 100 mg/dL — AB (ref 65–99)
Potassium: 3.4 mmol/L — ABNORMAL LOW (ref 3.5–5.1)
Sodium: 139 mmol/L (ref 135–145)

## 2017-01-23 LAB — CBC
HCT: 36.9 % (ref 36.0–46.0)
HEMOGLOBIN: 12.2 g/dL (ref 12.0–15.0)
MCH: 26.6 pg (ref 26.0–34.0)
MCHC: 33.1 g/dL (ref 30.0–36.0)
MCV: 80.6 fL (ref 78.0–100.0)
Platelets: 222 10*3/uL (ref 150–400)
RBC: 4.58 MIL/uL (ref 3.87–5.11)
RDW: 12.8 % (ref 11.5–15.5)
WBC: 5 10*3/uL (ref 4.0–10.5)

## 2017-01-23 MED ORDER — IOPAMIDOL (ISOVUE-370) INJECTION 76%
INTRAVENOUS | Status: AC
  Administered 2017-01-23: 100 mL
  Filled 2017-01-23: qty 100

## 2017-01-23 MED ORDER — HYDROCHLOROTHIAZIDE 12.5 MG PO CAPS
12.5000 mg | ORAL_CAPSULE | Freq: Every day | ORAL | Status: DC
  Administered 2017-01-24: 12.5 mg via ORAL
  Filled 2017-01-23: qty 1

## 2017-01-23 MED ORDER — MORPHINE SULFATE (PF) 2 MG/ML IV SOLN: 2.0000 mg | INTRAVENOUS | Status: DC | PRN

## 2017-01-23 MED ORDER — HYPROMELLOSE (GONIOSCOPIC) 2.5 % OP SOLN
1.0000 [drp] | Freq: Every day | OPHTHALMIC | Status: DC
  Filled 2017-01-23: qty 15

## 2017-01-23 MED ORDER — LORATADINE 10 MG PO TABS
10.0000 mg | ORAL_TABLET | Freq: Every day | ORAL | Status: DC | PRN
Start: 2017-01-23 — End: 2017-01-24

## 2017-01-23 MED ORDER — LEVOTHYROXINE SODIUM 25 MCG PO TABS
125.0000 ug | ORAL_TABLET | Freq: Every day | ORAL | Status: DC
  Administered 2017-01-24: 125 ug via ORAL
  Filled 2017-01-23: qty 1

## 2017-01-23 MED ORDER — ATORVASTATIN CALCIUM 40 MG PO TABS
40.0000 mg | ORAL_TABLET | Freq: Every day | ORAL | Status: DC
  Administered 2017-01-24: 40 mg via ORAL
  Filled 2017-01-23: qty 1

## 2017-01-23 MED ORDER — ASPIRIN EC 81 MG PO TBEC
81.0000 mg | DELAYED_RELEASE_TABLET | Freq: Every day | ORAL | Status: DC
  Administered 2017-01-24: 81 mg via ORAL
  Filled 2017-01-23: qty 1

## 2017-01-23 MED ORDER — ALBUTEROL SULFATE (2.5 MG/3ML) 0.083% IN NEBU: 2.5000 mg | INHALATION_SOLUTION | RESPIRATORY_TRACT | Status: DC | PRN

## 2017-01-23 MED ORDER — PANTOPRAZOLE SODIUM 40 MG PO TBEC
40.0000 mg | DELAYED_RELEASE_TABLET | Freq: Every day | ORAL | Status: DC
  Administered 2017-01-24 (×2): 40 mg via ORAL
  Filled 2017-01-23: qty 1

## 2017-01-23 MED ORDER — AMLODIPINE BESYLATE 10 MG PO TABS
10.0000 mg | ORAL_TABLET | Freq: Every day | ORAL | Status: DC
  Administered 2017-01-24: 10 mg via ORAL
  Filled 2017-01-23: qty 1

## 2017-01-23 MED ORDER — GI COCKTAIL ~~LOC~~
30.0000 mL | Freq: Four times a day (QID) | ORAL | Status: DC | PRN
  Administered 2017-01-24 (×2): 30 mL via ORAL
  Filled 2017-01-23 (×2): qty 30

## 2017-01-23 MED ORDER — ACETAMINOPHEN 325 MG PO TABS: 650.0000 mg | ORAL_TABLET | ORAL | Status: DC | PRN

## 2017-01-23 MED ORDER — POLYVINYL ALCOHOL 1.4 % OP SOLN
1.0000 [drp] | Freq: Every day | OPHTHALMIC | Status: DC
  Administered 2017-01-24: 1 [drp] via OPHTHALMIC
  Filled 2017-01-23: qty 15

## 2017-01-23 MED ORDER — METHYLPREDNISOLONE SODIUM SUCC 125 MG IJ SOLR
80.0000 mg | Freq: Once | INTRAMUSCULAR | Status: AC
  Administered 2017-01-23: 80 mg via INTRAVENOUS
  Filled 2017-01-23: qty 2

## 2017-01-23 MED ORDER — IPRATROPIUM-ALBUTEROL 0.5-2.5 (3) MG/3ML IN SOLN
3.0000 mL | Freq: Once | RESPIRATORY_TRACT | Status: AC
  Administered 2017-01-23: 3 mL via RESPIRATORY_TRACT
  Filled 2017-01-23: qty 3

## 2017-01-23 MED ORDER — ASPIRIN 81 MG PO CHEW
324.0000 mg | CHEWABLE_TABLET | Freq: Once | ORAL | Status: AC
  Administered 2017-01-23: 324 mg via ORAL
  Filled 2017-01-23: qty 4

## 2017-01-23 MED ORDER — ONDANSETRON HCL 4 MG/2ML IJ SOLN: 4.0000 mg | Freq: Four times a day (QID) | INTRAMUSCULAR | Status: DC | PRN

## 2017-01-23 MED ORDER — ENOXAPARIN SODIUM 40 MG/0.4ML ~~LOC~~ SOLN
40.0000 mg | Freq: Every day | SUBCUTANEOUS | Status: DC
  Administered 2017-01-24: 40 mg via SUBCUTANEOUS
  Filled 2017-01-23: qty 0.4

## 2017-01-23 NOTE — ED Provider Notes (Signed)
[facility] DEPT Provider Note   CSN: [id number] Arrival date & time: 01/23/17  1248     History   Chief Complaint Chief Complaint  Patient presents with  . Chest Pain    HPI Marilyn [name] is a 51 y.o. female.  HPI   ROSALY [name] is a 51 y.o. female, with a history of asthma, HTN, hyperlipidemia, hypothyroidism, and GERD, presenting to the ED with chest pain beginning last night.  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 Pain subsided with rest, but then recurred with exertion. This same thing happened this morning. She does admit to working with some dusty objects that may have exacerbated her asthma, but the pain did not feel like the chest tightness she usually experiences with her asthma and she has not used her albuterol inhaler. Accompanied by shortness of breath, epigastric cramping, and diaphoresis. Endorses a mildly productive cough with clear sputum beginning this morning and intermittent palpitations last week. Adds that she has noticed some increased shortness of breath with walking long distances and use of stairs increasing over the last several months. Thyroid function last checked March 2018 and was normal.  Denies N/V/D, fever/chills, abdominal pain, peripheral edema, dizziness/lightheadedness, or any other complaints.   Denies history of DVT/PE, hormonal therapy, recent surgery, trauma, immobilization, or leg swelling. Her last immobilization was a road trip to [place] in June 2018.    Past Medical History:  Diagnosis Date  . 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/2007  . ASTHMA 01/30/2007  . Breast tumor    right; benign  . GERD 01/31/2007  . Hemorrhoids   . Hiatal hernia   . HTN (hypertension) 09/09/2011  . Hyperlipidemia   . HYPOTHYROIDISM 01/30/2007  .  Mack Guise tear 04/2003    Patient Active Problem List   Diagnosis Date Noted  . Nausea & vomiting 07/16/2015  . RUQ abdominal pain 07/16/2015  . RLQ abdominal pain 07/15/2015  . Pain in lower back 11/30/2014  . Acute upper respiratory infection 05/05/2014  . Allergic rhinitis 05/05/2014  . Hoarseness 05/05/2014  . Dysphagia 05/05/2014  . Obesity 04/11/2014  . Edema, peripheral 04/11/2014  . Cough 12/20/2012  . Hypersomnolence 12/15/2012  . DOE (dyspnea on exertion) 12/15/2012  . Right shoulder pain 12/15/2012  . Abdominal pain 11/15/2011  . HTN (hypertension) 09/09/2011  . Neck pain 09/09/2011  . Preventative health care 04/30/2011  . HOT FLASHES 12/22/2009  . CHEST PAIN 01/29/2009  . HYPERTHYROIDISM 01/31/2007  . GERD 01/31/2007  . Hypothyroidism 01/30/2007  . Hyperlipidemia 01/30/2007  . Iron deficiency anemia 01/30/2007  . ASTHMA 01/30/2007    Past Surgical History:  Procedure Laterality Date  . BREAST LUMPECTOMY WITH RADIOACTIVE SEED LOCALIZATION Right 02/25/2015   Procedure: BREAST LUMPECTOMY WITH RADIOACTIVE SEED LOCALIZATION;  Surgeon: Alphonsa Overall, MD;  Location: [facility];  Service: General;  Laterality: Right;  . BREAST REDUCTION SURGERY  1986  . broken jaw    . CARPAL TUNNEL RELEASE  2001  . THYROIDECTOMY    . TUBAL LIGATION  1987    OB History    No data available       Home Medications    Prior to Admission medications   Medication Sig Start Date End Date Taking? Authorizing Provider  acetaminophen (TYLENOL) 325  MG tablet Take 325 mg by mouth every 6 (six) hours as needed for moderate pain.   Yes [provider]  albuterol (PROVENTIL HFA;VENTOLIN HFA) 108 (90 Base) MCG/ACT inhaler Inhale 2 puffs into the lungs every 4 (four) hours as needed for wheezing or shortness of breath (cough, shortness of breath or wheezing.). 06/26/15  Yes English, Stephanie D, PA  amLODipine (NORVASC) 10 MG tablet TAKE ONE TABLET BY MOUTH ONCE  DAILY Patient taking differently: Take 10 mg by mouth daily.  08/16/16  Yes English, Colletta [place] D, PA  atorvastatin (LIPITOR) 40 MG tablet Take 1 tablet (40 mg total) by mouth daily. 08/27/16  Yes English, Stephanie D, PA  EPIPEN 2-PAK 0.3 MG/0.3ML SOAJ injection Inject 0.3 mg as directed as needed for anaphylaxis. 11/21/16  Yes [provider]  hydrochlorothiazide (MICROZIDE) 12.5 MG capsule Take 1 capsule (12.5 mg total) by mouth daily. As needed for swelling 04/07/14  Yes Biagio Borg, MD  hydroxypropyl methylcellulose / hypromellose (ISOPTO TEARS / GONIOVISC) 2.5 % ophthalmic solution Place 1 drop into both eyes at bedtime.   Yes [provider]  lansoprazole (PREVACID) 30 MG capsule Take 1 capsule (30 mg total) by mouth 2 (two) times daily before a meal. Patient taking differently: Take 30 mg by mouth 2 (two) times daily as needed (heartburn).  09/06/16  Yes Ivar Drape D, PA  levothyroxine (SYNTHROID, LEVOTHROID) 125 MCG tablet Take 1 tablet (125 mcg total) by mouth daily before breakfast. 09/06/16  Yes English, Stephanie D, PA  loratadine (CLARITIN) 10 MG tablet Take 10 mg by mouth daily as needed for allergies.   Yes [provider]  polyvinyl alcohol (LIQUIFILM TEARS) 1.4 % ophthalmic solution Place 1 drop into both eyes at bedtime. 07/21/15  Yes Nita Sells, MD  Azelastine-Fluticasone (DYMISTA) 137-50 MCG/ACT SUSP Place 2 puffs into the nose daily. Patient not taking: Reported on 01/23/2017 09/09/16   Ivar Drape D, PA  Beclomethasone Dipropionate 80 MCG/ACT AERS Place 2 puffs into the nose daily. Patient not taking: Reported on 01/23/2017 09/06/16   Ivar Drape D, PA  cyclobenzaprine (FLEXERIL) 10 MG tablet Take 0.5-1 tablets (5-10 mg total) by mouth 3 (three) times daily as needed. Patient not taking: Reported on 10/09/2016 08/09/16   Tereasa Coop, PA-C  ergocalciferol (DRISDOL) 50000 units capsule Take 1 capsule (50,000 Units total) by mouth  once a week. Patient not taking: Reported on 01/23/2017 08/27/16   Ivar Drape D, PA  ranitidine (ZANTAC) 150 MG tablet Take 1 tablet (150 mg total) by mouth 2 (two) times daily. Patient not taking: Reported on 01/23/2017 10/09/16   Ivar Drape D, PA  triamcinolone cream (KENALOG) 0.1 % Apply 1 application topically 2 (two) times daily. Patient not taking: Reported on 01/23/2017 10/09/16   Joretta Bachelor, PA    Family History Family History  Problem Relation Age of Onset  . Hypertension Father   . Diabetes Father   . Asthma Mother   . Breast cancer Sister   . Diabetes Maternal Grandmother   . Heart disease Maternal Grandmother   . Heart attack Maternal Grandmother 60  . Asthma Maternal Grandfather   . Heart attack Maternal Grandfather   . Colon cancer Neg Hx     Social History Social History  Substance Use Topics  . Smoking status: Former Smoker    Packs/day: 0.25    Years: 3.00    Types: Cigarettes    Quit date: 06/04/1980  . Smokeless tobacco: Never Used  . Alcohol  use No     Allergies   Tuna [fish allergy]; Codeine; and Flonase [fluticasone propionate]   Review of Systems Review of Systems  Constitutional: Negative for chills and fever.  Eyes: Negative for visual disturbance.  Respiratory: Positive for cough and shortness of breath.   Cardiovascular: Positive for chest pain and palpitations. Negative for leg swelling.  Gastrointestinal: Positive for abdominal pain. Negative for diarrhea, nausea and vomiting.  Neurological: Negative for dizziness, syncope, weakness, light-headedness, numbness and headaches.  All other systems reviewed and are negative.    Physical Exam Updated Vital Signs BP 126/79 (BP Location: Right Arm)   Pulse 81   Temp 98.7 F (37.1 C) (Oral)   Resp 20   Ht 5\' 4"  (1.626 m)   Wt 86.2 kg (190 lb)   LMP 03/18/2014   SpO2 98%   BMI 32.61 kg/m   Physical Exam  Constitutional: She appears well-developed and well-nourished.  No distress.  HENT:  Head: Normocephalic and atraumatic.  Eyes: Pupils are equal, round, and reactive to light. Conjunctivae and EOM are normal.  Neck: Neck supple.  Cardiovascular: Normal rate, regular rhythm, normal heart sounds and intact distal pulses.   Pulmonary/Chest: She has decreased breath sounds. She exhibits no tenderness.  Some increased work of breathing and tachypnea with walking about 10-15 feet. Patient has to pause to catch her breath after a few sentences.   Abdominal: Soft. There is no tenderness. There is no guarding.  Musculoskeletal: She exhibits no edema or tenderness.  No noted tenderness to the chest, back, neck, shoulders, or arms. No pain with ROM of upper extremities.   Lymphadenopathy:    She has no cervical adenopathy.  Neurological: She is alert.  Skin: Skin is warm and dry. Capillary refill takes less than 2 seconds. She is not diaphoretic.  Psychiatric: She has a normal mood and affect. Her behavior is normal.  Nursing note and vitals reviewed.    ED Treatments / Results  Labs (all labs ordered are listed, but only abnormal results are displayed) Labs Reviewed  BASIC METABOLIC PANEL - Abnormal; Notable for the following:       Result Value   Potassium 3.4 (*)    Glucose, Bld 100 (*)    Calcium 8.2 (*)    All other components within normal limits  D-DIMER, QUANTITATIVE (NOT AT [facility]) - Abnormal; Notable for the following:    D-Dimer, Quant 0.77 (*)    All other components within normal limits  HEPATIC FUNCTION PANEL - Abnormal; Notable for the following:    Bilirubin, Direct <0.1 (*)    All other components within normal limits  CBC  I-STAT TROPONIN, ED  I-STAT TROPONIN, ED    EKG  EKG Interpretation  Date/Time:  Wednesday January 23 2017 12:57:12 EDT Ventricular Rate:  102 PR Interval:  180 QRS Duration: 66 QT Interval:  360 QTC Calculation: 469 R Axis:   39 Text Interpretation:  Sinus tachycardia Low voltage QRS Cannot rule out  Anterior infarct , age undetermined Abnormal ECG Confirmed by Charlesetta Shanks [id number]) on 01/23/2017 5:48:54 PM       Radiology Dg Chest 2 View  Result Date: 01/23/2017 CLINICAL DATA:  Mid and right-sided chest pain with exertional shortness of breath and productive cough for the past day. History of asthma, former smoker. EXAM: CHEST  2 VIEW COMPARISON:  PA and lateral chest x-ray dated March 16, 2015 FINDINGS: The lungs are adequately inflated. There is no focal infiltrate. There is no pleural effusion.  The interstitial markings are mildly prominent but stable. The heart and pulmonary vascularity are normal. There is calcification in the wall of the aortic arch. 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 IMPRESSION: Mild reactive airway-smoking related interstitial changes, stable. No pneumonia, CHF, nor other acute cardiopulmonary abnormality. Electronically Signed   By: David  Martinique M.D.   On: 01/23/2017 13:34   Ct Angio Chest Pe W And/or Wo Contrast  Result Date: 01/23/2017 CLINICAL DATA:  51 y/o F; shortness of breath and positive D-dimer. PE suspected, intermediate probability. EXAM: CT ANGIOGRAPHY CHEST WITH CONTRAST TECHNIQUE: Multidetector CT imaging of the chest was performed using the standard protocol during bolus administration of intravenous contrast. Multiplanar CT image reconstructions and MIPs were obtained to evaluate the vascular anatomy. CONTRAST:  100 cc Isovue 370 COMPARISON:  06/22/2014 chest CTA FINDINGS: Cardiovascular: Preferential opacification of the thoracic aorta. No evidence of thoracic aortic aneurysm or dissection. Normal heart size. No pericardial effusion. Mediastinum/Nodes: No enlarged mediastinal, hilar, or axillary lymph nodes. Thyroid gland, trachea, and esophagus demonstrate no significant findings. Lungs/Pleura: Stable 2 mm nodule in left upper lobe (series 6, image 60) 5 mm nodule along the minor fissure (series 6, image 67) compatible  with benign etiology. No consolidation, effusion, or pneumothorax. Upper Abdomen: No acute abnormality. Musculoskeletal: No chest wall abnormality. No acute or significant osseous findings. Review of the MIP images confirms the above findings. IMPRESSION: No pulmonary embolus identified.  Unremarkable CTA of the chest. Electronically Signed   By: Kristine Garbe M.D.   On: 01/23/2017 19:48    Procedures Angiocath insertion Date/Time: 01/23/2017 4:25 PM Performed by: Lorayne Bender Authorized by: Arlean Hopping C  Consent: Verbal consent obtained. Risks and benefits: risks, benefits and alternatives were discussed Consent given by: patient Patient understanding: patient states understanding of the procedure being performed Patient consent: the patient's understanding of the procedure matches consent given Procedure consent: procedure consent matches procedure scheduled Patient identity confirmed: verbally with patient Local anesthesia used: no  Anesthesia: Local anesthesia used: no  Sedation: Patient sedated: no Patient tolerance: Patient tolerated the procedure well with no immediate complications Comments: 67-YPPJK Angiocath placed in the left A/C without ultrasound guidance. Positive flash. Flushes easily and without pain or signs of infiltration. Blood for lab tests drawn at the same time.    (including critical care time)  Medications Ordered in ED Medications  ipratropium-albuterol (DUONEB) 0.5-2.5 (3) MG/3ML nebulizer solution 3 mL (3 mLs Nebulization Given 01/23/17 1628)  methylPREDNISolone sodium succinate (SOLU-MEDROL) 125 mg/2 mL injection 80 mg (80 mg Intravenous Given 01/23/17 1815)  aspirin chewable tablet 324 mg (324 mg Oral Given 01/23/17 1812)  iopamidol (ISOVUE-370) 76 % injection (100 mLs  Contrast Given 01/23/17 1917)     Initial Impression / Assessment and Plan / ED Course  I have reviewed the triage vital signs and the nursing notes.  Pertinent labs &  imaging results that were available during my care of the patient were reviewed by me and considered in my medical decision making (see chart for details).  Clinical Course as of Jan 23 2106  Wed Jan 23, 2017  1620 Patient placed on 2L O2 for comfort following ambulation. States this improved her breathing.   [SJ]  2034 Spoke with Dr. Radford Pax, cardiologist, who agrees that patient may benefit from admission via the hospitalist for chest pain rule out. States she does not recommend any further interventions for this patient, medication or otherwise.  [SJ]  2105 Spoke with Dr.  Yates, hospitalist, who agrees to admit the patient.  [SJ]    Clinical Course User Index [SJ] Joy, Shawn C, PA-C    Patient presents with chest pain and additionally concerning symptoms that resolve with rest. Admission for chest pain rule out.  Findings and plan of care discussed with Charlesetta Shanks, MD. Dr. Johnney Killian personally evaluated and examined this patient.  Vitals:   01/23/17 1945 01/23/17 2000 01/23/17 2015 01/23/17 2030  BP: 126/84 111/71 109/70 110/78  Pulse: 81 76 78 76  Resp: 12 15 (!) 22 19  Temp:      TempSrc:      SpO2: 99% 100% 98% 99%  Weight:      Height:         Final Clinical Impressions(s) / ED Diagnoses   Final diagnoses:  Chest pain due to myocardial ischemia, unspecified ischemic chest pain type    New Prescriptions New Prescriptions   No medications on file     Layla Maw 01/23/17 2108    Charlesetta Shanks, MD 01/30/17 [phone]

## 2017-01-23 NOTE — ED Notes (Signed)
Patient transported to CT 

## 2017-01-23 NOTE — ED Triage Notes (Signed)
Pt endorses right sided chest pain that radiates through to back and down right arm to elbow x 2 days. Denies sob/n/v.

## 2017-01-23 NOTE — H&P (Signed)
History and Physical    Marilyn [name]:[id number] DOB: August 17, 1965 DOA: 01/23/2017  PCP: Joretta Bachelor, PA Consultants:  None Patient coming from:  Home - lives with son; Donald Prose: Daughter, [phone]; [phone]  Chief Complaint: chest pain  HPI: Marilyn [name] is a 51 y.o. female with medical history significant of hypothyroidism; HLD; HTN; and GERD presenting with chest pain.  It started last night with chest pain and tightness.  She thought it was something she ate and expected it to pass. She took an ASA and some Prilosec/Prevacid and laid down and went to sleep.  She awoke this AM, went to work. She walked around and started hurting.  Substernal, radiatied into her right chest/back.  Then she developed pelvic pain.  About a week ago, she was noticing fluttering in her chest, improved with sitting and eventually resolved.  The pressure today was like someone is sitting on her chest with their foot in her back. Used to be anemic, has not had treatments in a long time.  Non-exertional, but improves with rest.  Worse with significant exertion.  +SOB.  She used to be able to go up the steps easily but now she is very SOB with exertion within the last maybe 2 months.  She has never had a stress test.  She did wear a Holter monitor many years ago.     ED Course: Chest pain - Dr. Radford Pax recommends observation for rule out without heparin anticoagulation or other intervention at this time  Review of Systems: As per HPI; otherwise review of systems reviewed and negative.   Ambulatory Status:   Ambulates without assistance  Past Medical History:  Diagnosis Date  . 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/2007  . ASTHMA 01/30/2007  . Breast tumor    right; benign  . GERD 01/31/2007  . Hemorrhoids   . Hiatal hernia   . HTN (hypertension) 09/09/2011  . Hyperlipidemia   . HYPOTHYROIDISM 01/30/2007  . Mallory Mariel Kansky tear 04/2003    Past Surgical History:  Procedure Laterality Date  . BREAST  LUMPECTOMY WITH RADIOACTIVE SEED LOCALIZATION Right 02/25/2015   Procedure: BREAST LUMPECTOMY WITH RADIOACTIVE SEED LOCALIZATION;  Surgeon: Alphonsa Overall, MD;  Location: [place];  Service: General;  Laterality: Right;  . BREAST REDUCTION SURGERY  1986  . broken jaw    . CARPAL TUNNEL RELEASE  2001  . THYROIDECTOMY    . TUBAL LIGATION  1987    Social History   Social History  . Marital status: Single    Spouse name: N/A  . Number of children: 3  . Years of education: N/A   Occupational History  . [employer]   Social History Main Topics  . Smoking status: Former Smoker    Packs/day: 0.25    Years: 3.00    Types: Cigarettes    Quit date: 06/04/1980  . Smokeless tobacco: Never Used  . Alcohol use No  . Drug use: No  . Sexual activity: Not on file   Other Topics Concern  . Not on file   Social History Narrative   Married    Allergies  Allergen Reactions  . Tuna [Fish Allergy] Anaphylaxis  . Codeine Nausea And Vomiting  . Flonase [Fluticasone Propionate] Other (See Comments)    Nose bleeds    Family History  Problem Relation Age of Onset  . Hypertension Father   . Diabetes Father   . Asthma Mother   . Breast cancer Sister   .  Diabetes Maternal Grandmother   . Heart disease Maternal Grandmother   . Heart attack Maternal Grandmother 60  . Asthma Maternal Grandfather   . Heart attack Maternal Grandfather   . Colon cancer Neg Hx     Prior to Admission medications   Medication Sig Start Date End Date Taking? Authorizing Provider  acetaminophen (TYLENOL) 325 MG tablet Take 325 mg by mouth every 6 (six) hours as needed for moderate pain.   Yes [provider]  albuterol (PROVENTIL HFA;VENTOLIN HFA) 108 (90 Base) MCG/ACT inhaler Inhale 2 puffs into the lungs every 4 (four) hours as needed for wheezing or shortness of breath (cough, shortness of breath or wheezing.). 06/26/15  Yes English, Stephanie D, PA  amLODipine  (NORVASC) 10 MG tablet TAKE ONE TABLET BY MOUTH ONCE DAILY Patient taking differently: Take 10 mg by mouth daily.  08/16/16  Yes English, Colletta [place] D, PA  atorvastatin (LIPITOR) 40 MG tablet Take 1 tablet (40 mg total) by mouth daily. 08/27/16  Yes English, Stephanie D, PA  EPIPEN 2-PAK 0.3 MG/0.3ML SOAJ injection Inject 0.3 mg as directed as needed for anaphylaxis. 11/21/16  Yes [provider]  hydrochlorothiazide (MICROZIDE) 12.5 MG capsule Take 1 capsule (12.5 mg total) by mouth daily. As needed for swelling 04/07/14  Yes Biagio Borg, MD  hydroxypropyl methylcellulose / hypromellose (ISOPTO TEARS / GONIOVISC) 2.5 % ophthalmic solution Place 1 drop into both eyes at bedtime.   Yes [provider]  lansoprazole (PREVACID) 30 MG capsule Take 1 capsule (30 mg total) by mouth 2 (two) times daily before a meal. Patient taking differently: Take 30 mg by mouth 2 (two) times daily as needed (heartburn).  09/06/16  Yes Ivar Drape D, PA  levothyroxine (SYNTHROID, LEVOTHROID) 125 MCG tablet Take 1 tablet (125 mcg total) by mouth daily before breakfast. 09/06/16  Yes English, Stephanie D, PA  loratadine (CLARITIN) 10 MG tablet Take 10 mg by mouth daily as needed for allergies.   Yes [provider]  polyvinyl alcohol (LIQUIFILM TEARS) 1.4 % ophthalmic solution Place 1 drop into both eyes at bedtime. 07/21/15  Yes Nita Sells, MD  Azelastine-Fluticasone (DYMISTA) 137-50 MCG/ACT SUSP Place 2 puffs into the nose daily. Patient not taking: Reported on 01/23/2017 09/09/16   Ivar Drape D, PA  Beclomethasone Dipropionate 80 MCG/ACT AERS Place 2 puffs into the nose daily. Patient not taking: Reported on 01/23/2017 09/06/16   Ivar Drape D, PA  cyclobenzaprine (FLEXERIL) 10 MG tablet Take 0.5-1 tablets (5-10 mg total) by mouth 3 (three) times daily as needed. Patient not taking: Reported on 10/09/2016 08/09/16   Tereasa Coop, PA-C  ergocalciferol (DRISDOL) 50000 units  capsule Take 1 capsule (50,000 Units total) by mouth once a week. Patient not taking: Reported on 01/23/2017 08/27/16   Ivar Drape D, PA  ranitidine (ZANTAC) 150 MG tablet Take 1 tablet (150 mg total) by mouth 2 (two) times daily. Patient not taking: Reported on 01/23/2017 10/09/16   Ivar Drape D, PA  triamcinolone cream (KENALOG) 0.1 % Apply 1 application topically 2 (two) times daily. Patient not taking: Reported on 01/23/2017 10/09/16   Joretta Bachelor, [place]    Physical Exam: Vitals:   01/23/17 2030 01/23/17 2130 01/23/17 2145 01/23/17 2227  BP: 110/78 127/88 120/78 134/77  Pulse: 76 80  81  Resp: 19 17  20   Temp:    97.9 F (36.6 C)  TempSrc:    Oral  SpO2: 99% 99% 100% 97%  Weight:    88.4  kg (194 lb 14.4 oz)  Height:         General: Appears calm and comfortable and is NAD Eyes:  PERRL, EOMI, normal lids, iris ENT:  grossly normal hearing, lips & tongue, mmm; appropriate dentition Neck:  no LAD, masses or thyromegaly; no carotid bruits Cardiovascular:  RRR, no m/r/g. No LE edema.  Respiratory:   CTA bilaterally with no wheezes/rales/rhonchi.  Normal respiratory effort. Abdomen:  soft, NT, ND, NABS Back:   normal alignment, no CVAT Skin:  no rash or induration seen on limited exam Musculoskeletal:  grossly normal tone BUE/BLE, good ROM, no bony abnormality Lower extremity:  No LE edema.  Limited foot exam with no ulcerations.  2+ distal pulses. Psychiatric:  grossly normal mood and affect, speech fluent and appropriate, AOx3 Neurologic:  CN 2-12 grossly intact, moves all extremities in coordinated fashion, sensation intact    Radiological Exams on Admission: Dg Chest 2 View  Result Date: 01/23/2017 CLINICAL DATA:  Mid and right-sided chest pain with exertional shortness of breath and productive cough for the past day. History of asthma, former smoker. EXAM: CHEST  2 VIEW COMPARISON:  PA and lateral chest x-ray dated March 16, 2015 FINDINGS: The lungs are  adequately inflated. There is no focal infiltrate. There is no pleural effusion. The interstitial markings are mildly prominent but stable. The heart and pulmonary vascularity are normal. There is calcification in the wall of the aortic arch. 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 IMPRESSION: Mild reactive airway-smoking related interstitial changes, stable. No pneumonia, CHF, nor other acute cardiopulmonary abnormality. Electronically Signed   By: David  Martinique M.D.   On: 01/23/2017 13:34   Ct Angio Chest Pe W And/or Wo Contrast  Result Date: 01/23/2017 CLINICAL DATA:  51 y/o F; shortness of breath and positive D-dimer. PE suspected, intermediate probability. EXAM: CT ANGIOGRAPHY CHEST WITH CONTRAST TECHNIQUE: Multidetector CT imaging of the chest was performed using the standard protocol during bolus administration of intravenous contrast. Multiplanar CT image reconstructions and MIPs were obtained to evaluate the vascular anatomy. CONTRAST:  100 cc Isovue 370 COMPARISON:  06/22/2014 chest CTA FINDINGS: Cardiovascular: Preferential opacification of the thoracic aorta. No evidence of thoracic aortic aneurysm or dissection. Normal heart size. No pericardial effusion. Mediastinum/Nodes: No enlarged mediastinal, hilar, or axillary lymph nodes. Thyroid gland, trachea, and esophagus demonstrate no significant findings. Lungs/Pleura: Stable 2 mm nodule in left upper lobe (series 6, image 60) 5 mm nodule along the minor fissure (series 6, image 67) compatible with benign etiology. No consolidation, effusion, or pneumothorax. Upper Abdomen: No acute abnormality. Musculoskeletal: No chest wall abnormality. No acute or significant osseous findings. Review of the MIP images confirms the above findings. IMPRESSION: No pulmonary embolus identified.  Unremarkable CTA of the chest. Electronically Signed   By: Kristine Garbe M.D.   On: 01/23/2017 19:48    EKG: Independently reviewed.   Sinus tachycardia with rate 102; nonspecific ST changes with no evidence of acute ischemia   Labs on Admission: I have personally reviewed the available labs and imaging studies at the time of the admission.  Pertinent labs:   Troponin 0.00, 0.00, <0.03 D-dimer 0.77 Lipids 08/16/16 - TC 223, HDL 49, LDL 143, TG 31   Assessment/Plan Principal Problem:   Chest pain Active Problems:   Hypothyroidism   Hyperlipidemia   HTN (hypertension)   Chest pain -Patient with exertional chest pressure that appears to be consistent with unstable angina - exertional chest pain with associated SOB with  exertion (the SOB has been present for longer). -CXR unremarkable.   -Initial cardiac troponin negative.  -EKG not indicative of acute ischemia.   -GRACE score is 88; which predicts an in-hospital death rate of 0.6%.  -Will plan to place in observation status on telemetry to rule out ACS by overnight observation.  -cycle troponin q6h x 3 and repeat EKG in AM -Start ASA 81 mg  daily -morphine given -Risk factor stratification with HgbA1c and FLP; will also check TSH and UDS -Cardiology consultation in AM - NPO for possible stress test; Cards[employer] message sent  -Will plan to start Heparin drip if enzymes are positive and/or chest pain recurs  HTN -Takes Norvasc, HCTZ at home -Patient with good control while in the ER  HLD -Continue Lipitor 40 -Lipids were checked in 3/18 and were suboptimally controlled; will repeat FLP now  Hypothyroidism -Check TSH -Continue Synthroid at current dose for now   DVT prophylaxis:  Lovenox  Code Status:  Full - confirmed with patient/family Family Communication: Daughter and other family members present throughout evaluation Disposition Plan:  Home once clinically improved Consults called: Cardiology via [employer] (and by telephone in ER)  Admission status: It is my clinical opinion that referral for OBSERVATION is reasonable and necessary in  this patient based on the above information provided. The aforementioned taken together are felt to place the patient at high risk for further clinical deterioration. However it is anticipated that the patient may be medically stable for discharge from the hospital within 24 to 48 hours.     Karmen Bongo MD [facility]  If note is complete, please contact covering daytime or nighttime physician. [email or web address] Password [facility]  01/24/2017, 3:19 AM

## 2017-01-23 NOTE — ED Provider Notes (Signed)
Medical screening examination/treatment/procedure(s) were conducted as a shared visit with non-physician practitioner(s) and myself.  I personally evaluated the patient during the encounter.   EKG Interpretation  Date/Time:  Wednesday January 23 2017 12:57:12 EDT Ventricular Rate:  102 PR Interval:  180 QRS Duration: 66 QT Interval:  360 QTC Calculation: 469 R Axis:   39 Text Interpretation:  Sinus tachycardia Low voltage QRS Cannot rule out Anterior infarct , age undetermined Abnormal ECG Confirmed by Charlesetta Shanks [phone] on 01/23/2017 5:48:54 PM     Patient reports she developed chest pain yesterday. It was on the right side and center of her chest. Patient reports that she has developed shortness of breath with exertion. She reports today she became very short of breath with walking. She denies pain or swelling in her legs. On exam patient is alert and nontoxic. Does appear to have mild respiratory distress. Heart regular no gross rub murmur gallop. Lungs grossly clear. Adequate air flow. Lower extremities no peripheral edema. I agree with plan of management.   Charlesetta Shanks, MD 01/23/17 1750

## 2017-01-24 ENCOUNTER — Telehealth: Payer: Self-pay | Admitting: Gastroenterology

## 2017-01-24 ENCOUNTER — Encounter (HOSPITAL_COMMUNITY): Payer: Self-pay | Admitting: General Practice

## 2017-01-24 ENCOUNTER — Other Ambulatory Visit: Payer: Self-pay

## 2017-01-24 ENCOUNTER — Observation Stay (HOSPITAL_BASED_OUTPATIENT_CLINIC_OR_DEPARTMENT_OTHER): Payer: BC Managed Care – PPO

## 2017-01-24 DIAGNOSIS — K219 Gastro-esophageal reflux disease without esophagitis: Secondary | ICD-10-CM

## 2017-01-24 DIAGNOSIS — R079 Chest pain, unspecified: Secondary | ICD-10-CM

## 2017-01-24 DIAGNOSIS — E785 Hyperlipidemia, unspecified: Secondary | ICD-10-CM | POA: Diagnosis not present

## 2017-01-24 DIAGNOSIS — R7303 Prediabetes: Secondary | ICD-10-CM | POA: Diagnosis not present

## 2017-01-24 DIAGNOSIS — J4520 Mild intermittent asthma, uncomplicated: Secondary | ICD-10-CM | POA: Diagnosis not present

## 2017-01-24 DIAGNOSIS — E669 Obesity, unspecified: Secondary | ICD-10-CM | POA: Diagnosis not present

## 2017-01-24 DIAGNOSIS — E038 Other specified hypothyroidism: Secondary | ICD-10-CM | POA: Diagnosis not present

## 2017-01-24 DIAGNOSIS — I10 Essential (primary) hypertension: Secondary | ICD-10-CM | POA: Diagnosis not present

## 2017-01-24 DIAGNOSIS — R0789 Other chest pain: Secondary | ICD-10-CM | POA: Diagnosis not present

## 2017-01-24 HISTORY — DX: Chest pain, unspecified: R07.9

## 2017-01-24 LAB — HIV ANTIBODY (ROUTINE TESTING W REFLEX): HIV SCREEN 4TH GENERATION: NONREACTIVE

## 2017-01-24 LAB — LIPID PANEL
CHOL/HDL RATIO: 3.8 ratio
CHOLESTEROL: 165 mg/dL (ref 0–200)
HDL: 43 mg/dL (ref >40)
LDL Cholesterol: 104 mg/dL — ABNORMAL HIGH (ref 0–99)
Triglycerides: 88 mg/dL (ref <150)
VLDL: 18 mg/dL (ref 0–40)

## 2017-01-24 LAB — NM MYOCAR MULTI W/SPECT W/WALL MOTION / EF
CHL CUP RESTING HR STRESS: 89 {beats}/min
CSEPEDS: 40 s
CSEPHR: 73 %
CSEPPHR: 123 {beats}/min
Estimated workload: 1 METS
Exercise duration (min): 8 min
MPHR: 167 {beats}/min

## 2017-01-24 LAB — RAPID URINE DRUG SCREEN, HOSP PERFORMED
Amphetamines: NOT DETECTED
BARBITURATES: NOT DETECTED
Benzodiazepines: NOT DETECTED
Cocaine: NOT DETECTED
Opiates: NOT DETECTED
Tetrahydrocannabinol: NOT DETECTED

## 2017-01-24 LAB — HEMOGLOBIN A1C
HEMOGLOBIN A1C: 6.1 % — AB (ref 4.8–5.6)
MEAN PLASMA GLUCOSE: 128.37 mg/dL

## 2017-01-24 LAB — TROPONIN I: Troponin I: <0.03 ng/mL (ref <0.03)

## 2017-01-24 LAB — TSH: TSH: 0.039 u[IU]/mL — AB (ref 0.350–4.500)

## 2017-01-24 MED ORDER — LEVOTHYROXINE SODIUM 112 MCG PO TABS: 112.0000 ug | ORAL_TABLET | Freq: Every day | ORAL | Status: DC

## 2017-01-24 MED ORDER — TECHNETIUM TC 99M TETROFOSMIN IV KIT
30.0000 | PACK | Freq: Once | INTRAVENOUS | Status: AC | PRN
  Administered 2017-01-24: 30 via INTRAVENOUS

## 2017-01-24 MED ORDER — TECHNETIUM TC 99M TETROFOSMIN IV KIT
10.0000 | PACK | Freq: Once | INTRAVENOUS | Status: AC | PRN
  Administered 2017-01-24: 10 via INTRAVENOUS

## 2017-01-24 MED ORDER — REGADENOSON 0.4 MG/5ML IV SOLN
0.4000 mg | Freq: Once | INTRAVENOUS | Status: AC
Start: 2017-01-24 — End: 2017-01-24
  Administered 2017-01-24: 0.4 mg via INTRAVENOUS
  Filled 2017-01-24: qty 5

## 2017-01-24 MED ORDER — NITROGLYCERIN 0.4 MG SL SUBL
SUBLINGUAL_TABLET | SUBLINGUAL | Status: AC
  Filled 2017-01-24: qty 1

## 2017-01-24 MED ORDER — REGADENOSON 0.4 MG/5ML IV SOLN
INTRAVENOUS | Status: AC
  Administered 2017-01-24: 0.4 mg via INTRAVENOUS
  Filled 2017-01-24: qty 5

## 2017-01-24 MED ORDER — AMINOPHYLLINE 25 MG/ML IV SOLN
INTRAVENOUS | Status: AC
  Administered 2017-01-24: 75 mg
  Filled 2017-01-24: qty 10

## 2017-01-24 MED ORDER — ONDANSETRON HCL 4 MG/2ML IJ SOLN
INTRAMUSCULAR | Status: AC
  Administered 2017-01-24: 14:00:00
  Filled 2017-01-24: qty 2

## 2017-01-24 NOTE — Progress Notes (Signed)
Pt requesting out of work note  Paged MD

## 2017-01-24 NOTE — Discharge Summary (Signed)
Physician Discharge Summary  Marilyn [name] QJF:[id number] DOB: 01/15/1966 DOA: 01/23/2017  PCP: Ivar Drape D, PA  Admit date: 01/23/2017 Discharge date: 01/24/2017  Admitted From: Home Disposition: Home   Recommendations for Outpatient Follow-up:  1. Follow up with PCP in 1-2 weeks. Continue to recommend lifestyle modifications regarding obesity and prediabetes. 2. Recheck thyroid function testing in 4-6 weeks. TSH was low at 0.039. Synthroid was not changed, pending outpatient follow up. 3. Follow up with GI, Dr. Fuller Plan, for evaluation and management of GERD.   Home Health: None Equipment/Devices: None Discharge Condition: Stable CODE STATUS: Full Diet recommendation: Heart healthy  Brief/Interim Summary: Marilyn [name] is a 51 y.o. female with a medical history of hypothyroidism, HTN, HLD, GERD, and well-controlled asthma who presented to the ED 8/22 with chest pain. It had seemed to be relieved with ASA and prilosec/prevacid the previous night, though the central chest pain/tightness radiating to the right chest associated with dyspnea returned on exertion. Initial evaluation for ACS was negative (nonischemic ECG and negative troponin x2), though d-dimer was elevated, prompting CTA chest which was negative for PE, edema, or infiltrate. Chest pain was improved with GI cocktail, breathing treatments, and steroid injection. Cardiology was consulted due to chest pain with typical and atypical features. Dr. Radford Pax recommended observation for rule out without heparin anticoagulation. Nuclear stress test was performed 8/23, demonstrating low risk and no ischemia (full results below).   Discharge Diagnoses:  Principal Problem:   Chest pain Active Problems:   Hypothyroidism   Hyperlipidemia   HTN (hypertension)  Chest pain: ECG nonischemic, troponins negative. Had a low risk nuclear stress test 8/24. - Due to improvement with GERD therapies, suspect GI contribution to symptoms.  Will need outpatient follow up with her GI, Dr. Fuller Plan.   Prediabetes: HbA1c was 6.1%.  - Lifestyle modifications, follow up with PCP. No therapies initiated.   Essential HTN: Chronic, stable. Good control with home therapies.   Hyperlipidemia: LDL 104.  - Continue Lipitor 40mg   Hypothyroidism: TSH suppressed at 0.039. Pt reports her dose of synthroid was recently decreased.  - Will defer to outpatient providers, continuing current dose of synthroid for now.   GERD: Suspected cause of midchest pain.  - Continue home PPI and follow up with Dr. Fuller Plan.   Asthma: No wheezing or hypoxia to suggest exacerbation. Well-controlled at baseline, possibly being triggered by reflux.  - Continue home MDI prn and treat GERD as above.   Obesity: BMI is 33.39 kg/m.  - Lifestyle modifications recommended  Discharge Instructions Discharge Instructions    Diet - low sodium heart healthy    Complete by:  As directed    Discharge instructions    Complete by:  As directed    You were admitted for chest pain and had a stress test which demonstrated a low risk for coronary artery disease. This indicates that the cause of your pain was not cardiac. Due to your history, a GI cause is felt to be most likely, therefore you are stable for discharge with recommendations to follow up with your PCP (for thyroid function recheck) and GI (for GERD evaluation and treatment).  - In the meantime, continue taking all medications as you were. Your TSH was low when we checked it, but it is not urgent to make changes to your synthroid dose and your PCP would be best to manage this. Make an appointment soon.  - Eat a low salt diet, get regular exercise, and if your symptoms return or worsen,  seek medical attention right away.   Increase activity slowly    Complete by:  As directed      Allergies as of 01/24/2017      Reactions   Geralyn Flash [fish Allergy] Anaphylaxis   Codeine Nausea And Vomiting   Flonase [fluticasone  Propionate] Other (See Comments)   Nose bleeds      Medication List    TAKE these medications   acetaminophen 325 MG tablet Commonly known as:  TYLENOL Take 325 mg by mouth every 6 (six) hours as needed for moderate pain.   albuterol 108 (90 Base) MCG/ACT inhaler Commonly known as:  PROVENTIL HFA;VENTOLIN HFA Inhale 2 puffs into the lungs every 4 (four) hours as needed for wheezing or shortness of breath (cough, shortness of breath or wheezing.).   amLODipine 10 MG tablet Commonly known as:  NORVASC TAKE ONE TABLET BY MOUTH ONCE DAILY What changed:  how much to take  how to take this  when to take this  additional instructions   atorvastatin 40 MG tablet Commonly known as:  LIPITOR Take 1 tablet (40 mg total) by mouth daily.   EPIPEN 2-PAK 0.3 mg/0.3 mL Soaj injection Generic drug:  EPINEPHrine Inject 0.3 mg as directed as needed for anaphylaxis.   hydrochlorothiazide 12.5 MG capsule Commonly known as:  MICROZIDE Take 1 capsule (12.5 mg total) by mouth daily. As needed for swelling   hydroxypropyl methylcellulose / hypromellose 2.5 % ophthalmic solution Commonly known as:  ISOPTO TEARS / GONIOVISC Place 1 drop into both eyes at bedtime.   lansoprazole 30 MG capsule Commonly known as:  PREVACID Take 1 capsule (30 mg total) by mouth 2 (two) times daily before a meal. What changed:  when to take this  reasons to take this   levothyroxine 125 MCG tablet Commonly known as:  SYNTHROID, LEVOTHROID Take 1 tablet (125 mcg total) by mouth daily before breakfast.   loratadine 10 MG tablet Commonly known as:  CLARITIN Take 10 mg by mouth daily as needed for allergies.   polyvinyl alcohol 1.4 % ophthalmic solution Commonly known as:  LIQUIFILM TEARS Place 1 drop into both eyes at bedtime.            Discharge Care Instructions        Start     Ordered   01/24/17 0000  Increase activity slowly     01/24/17 1625   01/24/17 0000  Diet - low sodium heart  healthy     01/24/17 1625   01/24/17 0000  Discharge instructions    Comments:  You were admitted for chest pain and had a stress test which demonstrated a low risk for coronary artery disease. This indicates that the cause of your pain was not cardiac. Due to your history, a GI cause is felt to be most likely, therefore you are stable for discharge with recommendations to follow up with your PCP (for thyroid function recheck) and GI (for GERD evaluation and treatment).  - In the meantime, continue taking all medications as you were. Your TSH was low when we checked it, but it is not urgent to make changes to your synthroid dose and your PCP would be best to manage this. Make an appointment soon.  - Eat a low salt diet, get regular exercise, and if your symptoms return or worsen, seek medical attention right away.   01/24/17 1625     Follow-up Information    Ladene Artist, MD. Schedule an appointment as soon as possible  for a visit.   Specialty:  Gastroenterology Contact information: [address] [address] [phone]        Joretta Bachelor, [place]. Schedule an appointment as soon as possible for a visit in 2 week(s).   Specialties:  Physician Assistant, Urgent Care Contact information: [place] [address] [phone]          Allergies  Allergen Reactions  . Tuna [Fish Allergy] Anaphylaxis  . Codeine Nausea And Vomiting  . Flonase [Fluticasone Propionate] Other (See Comments)    Nose bleeds    Consultations:  Cardiology, Dr. Marlou Porch.  Procedures/Studies: Dg Chest 2 View  Result Date: 01/23/2017 CLINICAL DATA:  Mid and right-sided chest pain with exertional shortness of breath and productive cough for the past day. History of asthma, former smoker. EXAM: CHEST  2 VIEW COMPARISON:  PA and lateral chest x-ray dated March 16, 2015 FINDINGS: The lungs are adequately inflated. There is no focal infiltrate. There is no pleural effusion.  The interstitial markings are mildly prominent but stable. The heart and pulmonary vascularity are normal. There is calcification in the wall of the aortic arch. 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 IMPRESSION: Mild reactive airway-smoking related interstitial changes, stable. No pneumonia, CHF, nor other acute cardiopulmonary abnormality. Electronically Signed   By: David  Martinique M.D.   On: 01/23/2017 13:34   Ct Angio Chest Pe W And/or Wo Contrast  Result Date: 01/23/2017 CLINICAL DATA:  51 y/o F; shortness of breath and positive D-dimer. PE suspected, intermediate probability. EXAM: CT ANGIOGRAPHY CHEST WITH CONTRAST TECHNIQUE: Multidetector CT imaging of the chest was performed using the standard protocol during bolus administration of intravenous contrast. Multiplanar CT image reconstructions and MIPs were obtained to evaluate the vascular anatomy. CONTRAST:  100 cc Isovue 370 COMPARISON:  06/22/2014 chest CTA FINDINGS: Cardiovascular: Preferential opacification of the thoracic aorta. No evidence of thoracic aortic aneurysm or dissection. Normal heart size. No pericardial effusion. Mediastinum/Nodes: No enlarged mediastinal, hilar, or axillary lymph nodes. Thyroid gland, trachea, and esophagus demonstrate no significant findings. Lungs/Pleura: Stable 2 mm nodule in left upper lobe (series 6, image 60) 5 mm nodule along the minor fissure (series 6, image 67) compatible with benign etiology. No consolidation, effusion, or pneumothorax. Upper Abdomen: No acute abnormality. Musculoskeletal: No chest wall abnormality. No acute or significant osseous findings. Review of the MIP images confirms the above findings. IMPRESSION: No pulmonary embolus identified.  Unremarkable CTA of the chest. Electronically Signed   By: Kristine Garbe M.D.   On: 01/23/2017 19:48   Nm Myocar Multi W/spect W/wall Motion / Ef  Result Date: 01/24/2017 CLINICAL DATA:  51 year old female  presented number to prior with chest pain. Negative cardiac enzymes. Negative EKG EXAM: MYOCARDIAL IMAGING WITH SPECT (REST AND PHARMACOLOGIC-STRESS) GATED LEFT VENTRICULAR WALL MOTION STUDY LEFT VENTRICULAR EJECTION FRACTION TECHNIQUE: Standard myocardial SPECT imaging was performed after resting intravenous injection of 10 mCi Tc-84m tetrofosmin. Subsequently, intravenous infusion of Lexiscan was performed under the supervision of the Cardiology staff. At peak effect of the drug, 30 mCi Tc-52m tetrofosmin was injected intravenously and standard myocardial SPECT imaging was performed. Quantitative gated imaging was also performed to evaluate left ventricular wall motion, and estimate left ventricular ejection fraction. COMPARISON:  None. FINDINGS: EKG stress: No ST changes Perfusion: There decrease counts in the mid and basilar segment of the inferior wall which are fixed on rest and stress. No foci of reversible ischemia evident. No wall motion abnormality. Wall Motion:  Normal left ventricular wall motion. No left ventricular dilation. Left Ventricular Ejection Fraction: 61 % End diastolic volume 79 ml End systolic volume 30 ml IMPRESSION: 1. No reversible ischemia. Fixed decrease counts in the inferior wall are favored artifactual, cannot exclude scar. 2. Normal left ventricular wall motion. 3. Left ventricular ejection fraction 61% 4. Non invasive risk stratification*: Low *2012 Appropriate Use Criteria for Coronary Revascularization Focused Update: J Am Coll Cardiol. 5003;70(4):888-916. http://content.[email or web address]?articleid=[email or web address] Electronically Signed   By: Suzy Bouchard M.D.   On: 01/24/2017 15:58   Subjective: Chest pain improved overnight, specifically with GI cocktail, though the taste was terrible.   Discharge Exam: BP 115/70   Pulse 87   Temp (!) 97.5 F (36.4 C) (Oral)   Resp 18   Ht 5\' 4"  (1.626 m)   Wt 88.2 kg (194 lb 8 oz)   LMP 03/18/2014   SpO2 100%   BMI 33.39  kg/m   General: Pt is alert, awake, not in acute distress Cardiovascular: RRR, S1/S2 +, no rubs, no gallops Respiratory: CTA bilaterally, no wheezing, no rhonchi Abdominal: Soft, NT, ND, bowel sounds + Extremities: No edema, no cyanosis  Labs: Basic Metabolic Panel:  Recent Labs Lab 01/23/17 1335  NA 139  K 3.4*  CL 106  CO2 25  GLUCOSE 100*  BUN 10  CREATININE 0.79  CALCIUM 8.2*   Liver Function Tests:  Recent Labs Lab 01/23/17 1632  AST 24  ALT 19  ALKPHOS 63  BILITOT 0.6  PROT 7.7  ALBUMIN 3.9   CBC:  Recent Labs Lab 01/23/17 1335  WBC 5.0  HGB 12.2  HCT 36.9  MCV 80.6  PLT 222   Cardiac Enzymes:  Recent Labs Lab 01/23/17 2304 01/24/17 0503 01/24/17 1026  TROPONINI <0.03 <0.03 <0.03   D-Dimer  Recent Labs  01/23/17 1632  DDIMER 0.77*   Hgb A1c  Recent Labs  01/24/17 0503  HGBA1C 6.1*   Lipid Profile  Recent Labs  01/24/17 0503  CHOL 165  HDL 43  LDLCALC 104*  TRIG 88  CHOLHDL 3.8   Thyroid function studies  Recent Labs  01/24/17 0503  TSH 0.039*    Time coordinating discharge: Approximately 40 minutes  Vance Gather, MD  [facility] 01/24/2017, 4:25 PM Pager [phone]

## 2017-01-24 NOTE — Progress Notes (Signed)
RN attempting to go over pt discharge paperwork, pt complains of chest pressure. EKG completed, shows normal sinus rhythm and vital signs completed within normal limits. No nitro ordered. Paged MD  MD aware MD stated stress test shows low risk, MD aware of EKG, vitals and pt complaints of chest pain  MD stated safe to discharge, no further orders at this time and to have pt follow up outpt with primary care

## 2017-01-24 NOTE — Progress Notes (Signed)
Cardiology MD aware pt complaints of chest pain over night, no new orders, will continue to monitor MD aware pt poured out urine, awaiting new sample to send for UDS prior to stress test

## 2017-01-24 NOTE — Consult Note (Signed)
Cardiology Consultation:   Patient ID: Marilyn [name]; [id number]; Dec 24, 1965   Admit date: 01/23/2017 Date of Consult: 01/24/2017  Primary Care Provider: Joretta Bachelor, PA Primary Cardiologist: New Primary Electrophysiologist:  N/A   Patient Profile:   Marilyn [name] is a 51 y.o. female with a hx of former smoker (quit date:1982) acid reflux and dysphagia, asthma, peripheral edema, hypertension, obesity, hyperthyroidism, iron deficiency anemia who is being seen today for the evaluation of chest pain at the request of Dr. Bonner Puna.  History of Present Illness:   Marilyn [name] presented to the Emergency Department yesterday for evaluation of chest pains. She had chest discomfort on 8/21 in the evening that was a stabbing pain and radiated into her right shoulder, she took Prilosec and aspirin then went to bed and slept through the night. 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 She took an aspirin but the pain became very sharp so she told her boss she needed to be evaluated in the hospital. On arrival to the ER she was given more aspirin, a steroid shot and a breathing treatment- she reports that this alleviated her symptoms.  Last night, she reports taking all of her medications at one time which she does not do, she then had another occurrence of sharp chest pains like something really heavy sitting on her chest. An EKG was done and showed no changes. She was given a GI cocktail, and reports her symptoms completely resolved within 3 minutes. She denies frequent use of ETOH, NSAID use, or excessive amounts of fried foods.  She has a remote hx of wearing a Holter monitor she says as a kid but otherwise no cardiac work up or imaging done before in her past.   ER  COURSE: [name] ruled out in the ED  For MI but was admitted with mild respiratory distress. She has had two negative Troponins, BNP 9.7, chest xray with mild reactive airway-smoking related interstitial changes, stable. No Pneumonia, CHF or other acute cardiopulmonary abnormality. Elevated d-dimer, no PE or abnormal findings on CTA: notably no obv arthrosclerosis.  sCr 0.79, K 3.4,  Mag 2.3, H&H / 12 & 36.9. Dr. Lorin Mercy spoke with Dr. Radford Pax who recommended admit for obs r/o without heparin or intervention at the time.    Past Medical History:  Diagnosis Date  . 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/2007  . ASTHMA 01/30/2007  . Breast tumor    right; benign  . GERD 01/31/2007  . Hemorrhoids   . Hiatal hernia   . HTN (hypertension) 09/09/2011  . Hyperlipidemia   . HYPOTHYROIDISM 01/30/2007  . Mallory Mariel Kansky tear 04/2003    Past Surgical History:  Procedure Laterality Date  . BREAST LUMPECTOMY WITH RADIOACTIVE SEED LOCALIZATION Right 02/25/2015   Procedure: BREAST LUMPECTOMY WITH RADIOACTIVE SEED LOCALIZATION;  Surgeon: Alphonsa Overall, MD;  Location: [facility];  Service: General;  Laterality: Right;  . BREAST REDUCTION SURGERY  1986  . broken jaw    . CARPAL TUNNEL RELEASE  2001  . THYROIDECTOMY    . TUBAL LIGATION  1987     Inpatient Medications: Scheduled Meds: . amLODipine  10 mg Oral Daily  . aspirin EC  81 mg Oral Daily  . atorvastatin  40 mg Oral Daily  .  enoxaparin (LOVENOX) injection  40 mg Subcutaneous Q2000  . hydrochlorothiazide  12.5 mg Oral Daily  . [START ON 01/25/2017] levothyroxine  112 mcg Oral QAC breakfast  . pantoprazole  40 mg Oral Daily  . polyvinyl alcohol  1 drop Both Eyes QHS   Continuous Infusions:  PRN Meds: acetaminophen, albuterol, gi cocktail, loratadine, morphine injection, ondansetron (ZOFRAN) IV  Allergies:    Allergies  Allergen Reactions  . Tuna [Fish Allergy] Anaphylaxis  . Codeine Nausea And Vomiting  . Flonase [Fluticasone  Propionate] Other (See Comments)    Nose bleeds    Social History:   Social History   Social History  . Marital status: Single    Spouse name: N/A  . Number of children: 3  . Years of education: N/A   Occupational History  . [employer]   Social History Main Topics  . Smoking status: Former Smoker    Packs/day: 0.25    Years: 3.00    Types: Cigarettes    Quit date: 06/04/1980  . Smokeless tobacco: Never Used  . Alcohol use No  . Drug use: No  . Sexual activity: Not on file   Other Topics Concern  . Not on file   Social History Narrative   Married    Family History:   Family History  Problem Relation Age of Onset  . Hypertension Father   . Diabetes Father   . Asthma Mother   . Breast cancer Sister   . Diabetes Maternal Grandmother   . Heart disease Maternal Grandmother   . Heart attack Maternal Grandmother 60  . Asthma Maternal Grandfather   . Heart attack Maternal Grandfather   . Colon cancer Neg Hx      ROS:  Please see the history of present illness.  ROS  All other ROS reviewed and negative.     Physical Exam/Data:   Vitals:   01/23/17 2145 01/23/17 2227 01/24/17 0336 01/24/17 0639  BP: 120/78 134/77 121/70   Pulse:  81 94   Resp:  20 18   Temp:  97.9 F (36.6 C) (!) 97.5 F (36.4 C)   TempSrc:  Oral Oral   SpO2: 100% 97% 100%   Weight:  194 lb 14.4 oz (88.4 kg)  194 lb 8 oz (88.2 kg)  Height:        Intake/Output Summary (Last 24 hours) at 01/24/17 0944 Last data filed at 01/24/17 0848  Gross per 24 hour  Intake              240 ml  Output             1050 ml  Net             -810 ml   Filed Weights   01/23/17 1253 01/23/17 2227 01/24/17 0639  Weight: 190 lb (86.2 kg) 194 lb 14.4 oz (88.4 kg) 194 lb 8 oz (88.2 kg)   Body mass index is 33.39 kg/m.  General:  Well nourished, well developed, in no acute distress, obese HEENT: normal Lymph: no adenopathy Neck: no JVD Endocrine:  No thryomegaly Vascular: No  carotid bruits; FA pulses 2+ bilaterally without bruits  Cardiac:  normal S1, S2; RRR; no murmur Lungs:  clear to auscultation bilaterally, no wheezing, rhonchi or rales  Abd: soft, nontender, no hepatomegaly  Ext: no edema Musculoskeletal:  No deformities, BUE and BLE strength normal and equal Skin: warm and dry  Neuro:  CNs 2-12 intact, no focal abnormalities noted  Psych:  Normal affect   EKG:  The EKG was personally reviewed and demonstrates:  HR 90, normal sinus rhythm Telemetry:  Telemetry was personally reviewed and demonstrates:  Normal sinus rhythm with occasional PVC  Relevant CV Studies:  Nuc Med Stress pending this admission  Laboratory Data:  Chemistry Recent Labs Lab 01/23/17 1335  NA 139  K 3.4*  CL 106  CO2 25  GLUCOSE 100*  BUN 10  CREATININE 0.79  CALCIUM 8.2*  GFRNONAA >60  GFRAA >60  ANIONGAP 8     Recent Labs Lab 01/23/17 1632  PROT 7.7  ALBUMIN 3.9  AST 24  ALT 19  ALKPHOS 63  BILITOT 0.6   Hematology Recent Labs Lab 01/23/17 1335  WBC 5.0  RBC 4.58  HGB 12.2  HCT 36.9  MCV 80.6  MCH 26.6  MCHC 33.1  RDW 12.8  PLT 222   Cardiac Enzymes Recent Labs Lab 01/23/17 2304 01/24/17 0503  TROPONINI <0.03 <0.03    Recent Labs Lab 01/23/17 1343 01/23/17 1638  TROPIPOC 0.00 0.00    BNPNo results for input(s): BNP, PROBNP in the last 168 hours.  DDimer  Recent Labs Lab 01/23/17 1632  DDIMER 0.77*    Radiology/Studies:  Dg Chest 2 View  Result Date: 01/23/2017 CLINICAL DATA:  Mid and right-sided chest pain with exertional shortness of breath and productive cough for the past day. History of asthma, former smoker. EXAM: CHEST  2 VIEW COMPARISON:  PA and lateral chest x-ray dated March 16, 2015 FINDINGS: The lungs are adequately inflated. There is no focal infiltrate. There is no pleural effusion. The interstitial markings are mildly prominent but stable. The heart and pulmonary vascularity are normal. There is calcification  in the wall of the aortic arch. 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 IMPRESSION: Mild reactive airway-smoking related interstitial changes, stable. No pneumonia, CHF, nor other acute cardiopulmonary abnormality. Electronically Signed   By: David  Martinique M.D.   On: 01/23/2017 13:34   Ct Angio Chest Pe W And/or Wo Contrast  Result Date: 01/23/2017 CLINICAL DATA:  51 y/o F; shortness of breath and positive D-dimer. PE suspected, intermediate probability. EXAM: CT ANGIOGRAPHY CHEST WITH CONTRAST TECHNIQUE: Multidetector CT imaging of the chest was performed using the standard protocol during bolus administration of intravenous contrast. Multiplanar CT image reconstructions and MIPs were obtained to evaluate the vascular anatomy. CONTRAST:  100 cc Isovue 370 COMPARISON:  06/22/2014 chest CTA FINDINGS: Cardiovascular: Preferential opacification of the thoracic aorta. No evidence of thoracic aortic aneurysm or dissection. Normal heart size. No pericardial effusion. Mediastinum/Nodes: No enlarged mediastinal, hilar, or axillary lymph nodes. Thyroid gland, trachea, and esophagus demonstrate no significant findings. Lungs/Pleura: Stable 2 mm nodule in left upper lobe (series 6, image 60) 5 mm nodule along the minor fissure (series 6, image 67) compatible with benign etiology. No consolidation, effusion, or pneumothorax. Upper Abdomen: No acute abnormality. Musculoskeletal: No chest wall abnormality. No acute or significant osseous findings. Review of the MIP images confirms the above findings. IMPRESSION: No pulmonary embolus identified.  Unremarkable CTA of the chest. Electronically Signed   By: Kristine Garbe M.D.   On: 01/23/2017 19:48    Assessment and Plan:    Chest pains: Patient has some typical and atypical symptoms of chest pain. Typical because they are a pressure sensation that radiates into the back and down the arms, a pressure with associated shooting  sensations. It is atypical in the sense that it improves with GI cocktail  or GERD therapy early this morning and yesterday in the ER had relief with breathing treatment, steroid shot and aspirin. She has had negative Trop. x 2 and a non ischemic EKG. Otherwise her lab work and chest xray are unremarkable. -- I ordered NM Myoview for risk stratification. If risk is low would consider GI etiology likely. -- Continue Aspirin for now.  -- GI cocktail for chest pain exacerbation  Hypertension: Well controlled, cont Norvasc, HCTZ  Hx of GERD and mallory/Weiss tear/hiatal hernia: This could potentially be etiology of patients symptoms. If neg ischemic work-up recommend GI etiology to be considered. -- cont. protonix  Hyperlipidemia: LDL is 104 and triglycerides 88. Continue Lipitor and encourage positive dietary changes and weight loss.  Hypothyroidism: Medicine to manage.  Asthma: medicine to manage.  Kristopher Glee, PA-C  01/24/2017   Personally seen and examined. Agree with above.  51 year old with atypical CP, see above for details.   Alert, RRR no M, CTAB, +BS, no edema  - await NUC stress results  - continue good tx of HTN  - If NUC neg, consider further GI eval as outpt  - reassuring CTA of chest  Candee Furbish, MD

## 2017-01-24 NOTE — Progress Notes (Signed)
Placed page to MD regarding pt diet order

## 2017-01-24 NOTE — Progress Notes (Signed)
Patient C/O epigastric pain @ about 0344. V/S stable. EKG normal. Given GI [place] @ 8099 with good effect. No further deviations noted. Patient continued to deny pain and discomfort.

## 2017-01-24 NOTE — Progress Notes (Signed)
Pt IV discontinued, catheter intact and telemetry removed. Pt has all belongings, including out of work letter. Pt discharged paperwork gone over at bedside. Pt discharged via wheelchair.

## 2017-01-24 NOTE — Progress Notes (Signed)
Pt requesting gi cocktail prior to meal

## 2017-01-24 NOTE — Progress Notes (Signed)
   Livia Snellen presented for a nuclear stress test today.  No immediate complications.  Stress imaging is pending at this time.  Preliminary EKG findings may be listed in the chart, but the stress test result will not be finalized until perfusion imaging is complete.  1 day study, [facility] to read.  Rosaria Ferries, PA-C 01/24/2017, 1:18 PM

## 2017-01-25 ENCOUNTER — Ambulatory Visit (INDEPENDENT_AMBULATORY_CARE_PROVIDER_SITE_OTHER): Payer: BC Managed Care – PPO | Admitting: Physician Assistant

## 2017-01-25 ENCOUNTER — Encounter: Payer: Self-pay | Admitting: Physician Assistant

## 2017-01-25 VITALS — BP 105/70 | HR 96 | Temp 98.2°F | Resp 18 | Ht 64.0 in | Wt 195.2 lb

## 2017-01-25 DIAGNOSIS — R7989 Other specified abnormal findings of blood chemistry: Secondary | ICD-10-CM

## 2017-01-25 DIAGNOSIS — R946 Abnormal results of thyroid function studies: Secondary | ICD-10-CM | POA: Diagnosis not present

## 2017-01-25 NOTE — Telephone Encounter (Signed)
Patient has been scheduled for an office visit with Alonza Bogus, PA-C on 01/29/17 at 3:00pm.

## 2017-01-25 NOTE — Patient Instructions (Signed)
     IF you received an x-ray today, you will receive an invoice from [facility]. Please contact [facility] at [phone] with questions or concerns regarding your invoice.   IF you received labwork today, you will receive an invoice from [facility]. Please contact [facility] at [phone] with questions or concerns regarding your invoice.   Our billing staff will not be able to assist you with questions regarding bills from these companies.  You will be contacted with the lab results as soon as they are available. The fastest way to get your results is to activate your My Chart account. Instructions are located on the last page of this paperwork. If you have not heard from us regarding the results in 2 weeks, please contact this office.     

## 2017-01-25 NOTE — Telephone Encounter (Signed)
Needs eval for GERD according to notes.  Can see APP or next opening with Dr. Fuller Plan

## 2017-01-25 NOTE — Progress Notes (Signed)
PRIMARY CARE AT [facility] [address], [place] [address] [phone]  Date:  01/25/2017   Name:  Marilyn [name]   DOB:  06-30-1965   MRN:  [id number]  PCP:  Joretta Bachelor, PA    History of Present Illness:  Marilyn [name] is a 51 y.o. female patient who presents to PCP with  Chief Complaint  Patient presents with  . Hospitalization Follow-up     --patient is here for follow up of chest pain.  Patient was evaluated with comprehensive cardiac evaluation.  This was found to likely be gerd.  She was treated and states that the chest pain has been improved and resolved.  She is watching her food intake.    Patient Active Problem List   Diagnosis Date Noted  . Chest pain 01/23/2017  . Nausea & vomiting 07/16/2015  . RUQ abdominal pain 07/16/2015  . RLQ abdominal pain 07/15/2015  . Pain in lower back 11/30/2014  . Acute upper respiratory infection 05/05/2014  . Allergic rhinitis 05/05/2014  . Hoarseness 05/05/2014  . Dysphagia 05/05/2014  . Obesity 04/11/2014  . Edema, peripheral 04/11/2014  . Cough 12/20/2012  . Hypersomnolence 12/15/2012  . DOE (dyspnea on exertion) 12/15/2012  . Right shoulder pain 12/15/2012  . Abdominal pain 11/15/2011  . HTN (hypertension) 09/09/2011  . Neck pain 09/09/2011  . Preventative health care 04/30/2011  . HOT FLASHES 12/22/2009  . CHEST PAIN 01/29/2009  . HYPERTHYROIDISM 01/31/2007  . GERD 01/31/2007  . Hypothyroidism 01/30/2007  . Hyperlipidemia 01/30/2007  . Iron deficiency anemia 01/30/2007  . ASTHMA 01/30/2007    Past Medical History:  Diagnosis Date  . 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/2007  . ASTHMA 01/30/2007  . Asthma   . Breast tumor    right; benign  . Chest pain 01/24/2017  . GERD 01/31/2007  . Hemorrhoids   . Hiatal hernia   . HTN (hypertension) 09/09/2011  . Hyperlipidemia   . HYPOTHYROIDISM 01/30/2007  . Mallory Mariel Kansky tear 04/2003    Past Surgical History:  Procedure Laterality Date  . BREAST  LUMPECTOMY WITH RADIOACTIVE SEED LOCALIZATION Right 02/25/2015   Procedure: BREAST LUMPECTOMY WITH RADIOACTIVE SEED LOCALIZATION;  Surgeon: Alphonsa Overall, MD;  Location: [facility];  Service: General;  Laterality: Right;  . BREAST REDUCTION SURGERY  1986  . broken jaw    . CARPAL TUNNEL RELEASE  2001  . THYROIDECTOMY    . TUBAL LIGATION  1987    Social History  Substance Use Topics  . Smoking status: Former Smoker    Packs/day: 0.25    Years: 3.00    Types: Cigarettes    Quit date: 06/04/1980  . Smokeless tobacco: Never Used  . Alcohol use No    Family History  Problem Relation Age of Onset  . Hypertension Father   . Diabetes Father   . Asthma Mother   . Breast cancer Sister   . Diabetes Maternal Grandmother   . Heart disease Maternal Grandmother   . Heart attack Maternal Grandmother 60  . Asthma Maternal Grandfather   . Heart attack Maternal Grandfather   . Colon cancer Neg Hx     Allergies  Allergen Reactions  . Tuna [Fish Allergy] Anaphylaxis  . Codeine Nausea And Vomiting  . Flonase [Fluticasone Propionate] Other (See Comments)    Nose bleeds    Medication list has been reviewed and updated.  Current Outpatient Prescriptions on File Prior to Visit  Medication Sig Dispense Refill  . acetaminophen (TYLENOL) 325  MG tablet Take 325 mg by mouth every 6 (six) hours as needed for moderate pain.    [name] albuterol (PROVENTIL HFA;VENTOLIN HFA) 108 (90 Base) MCG/ACT inhaler Inhale 2 puffs into the lungs every 4 (four) hours as needed for wheezing or shortness of breath (cough, shortness of breath or wheezing.). 1 Inhaler 1  . amLODipine (NORVASC) 10 MG tablet TAKE ONE TABLET BY MOUTH ONCE DAILY (Patient taking differently: Take 10 mg by mouth daily. ) 90 tablet 3  . atorvastatin (LIPITOR) 40 MG tablet Take 1 tablet (40 mg total) by mouth daily. 90 tablet 3  . EPIPEN 2-PAK 0.3 MG/0.3ML SOAJ injection Inject 0.3 mg as directed as needed for anaphylaxis.    .  hydrochlorothiazide (MICROZIDE) 12.5 MG capsule Take 1 capsule (12.5 mg total) by mouth daily. As needed for swelling 90 capsule 3  . hydroxypropyl methylcellulose / hypromellose (ISOPTO TEARS / GONIOVISC) 2.5 % ophthalmic solution Place 1 drop into both eyes at bedtime.    . lansoprazole (PREVACID) 30 MG capsule Take 1 capsule (30 mg total) by mouth 2 (two) times daily before a meal. (Patient taking differently: Take 30 mg by mouth 2 (two) times daily as needed (heartburn). ) 60 capsule 11  . levothyroxine (SYNTHROID, LEVOTHROID) 125 MCG tablet Take 1 tablet (125 mcg total) by mouth daily before breakfast. 90 tablet 3  . loratadine (CLARITIN) 10 MG tablet Take 10 mg by mouth daily as needed for allergies.    . polyvinyl alcohol (LIQUIFILM TEARS) 1.4 % ophthalmic solution Place 1 drop into both eyes at bedtime. 15 mL 0   No current facility-administered medications on file prior to visit.     ROS ROS otherwise unremarkable unless listed above.  Physical Examination: BP 105/70   Pulse 96   Temp 98.2 F (36.8 C) (Oral)   Resp 18   Ht 5\' 4"  (1.626 m)   Wt 195 lb 3.2 oz (88.5 kg)   LMP 03/18/2014   SpO2 97%   BMI 33.51 kg/m  Ideal Body Weight: Weight in (lb) to have BMI = 25: 145.3  Physical Exam  Constitutional: She is oriented to person, place, and time. She appears well-developed and well-nourished. No distress.  HENT:  Head: Normocephalic and atraumatic.  Right Ear: External ear normal.  Left Ear: External ear normal.  Eyes: Pupils are equal, round, and reactive to light. Conjunctivae and EOM are normal.  Neck: Carotid bruit is not present. No thyroid mass and no thyromegaly present.  Cardiovascular: Normal rate.   Pulmonary/Chest: Effort normal. No respiratory distress.  Neurological: She is alert and oriented to person, place, and time.  Skin: She is not diaphoretic.  Psychiatric: She has a normal mood and affect. Her behavior is normal.     Assessment and Plan: Marilyn [name] is a 51 y.o. female who is here today for cc of low tsh.  Low TSH level - Plan: Thyroid Cascade Profile, Basic metabolic panel  Ivar Drape, PA-C [facility] 9/4/20189:50 AM

## 2017-01-26 LAB — BASIC METABOLIC PANEL
BUN / CREAT RATIO: 16 (ref 9–23)
BUN: 16 mg/dL (ref 6–24)
CHLORIDE: 102 mmol/L (ref 96–106)
CO2: 23 mmol/L (ref 20–29)
Calcium: 8.9 mg/dL (ref 8.7–10.2)
Creatinine, Ser: 0.98 mg/dL (ref 0.57–1.00)
GFR calc non Af Amer: 66 mL/min/{1.73_m2} (ref >59)
GFR, EST AFRICAN AMERICAN: 76 mL/min/{1.73_m2} (ref >59)
Glucose: 109 mg/dL — ABNORMAL HIGH (ref 65–99)
Potassium: 3.9 mmol/L (ref 3.5–5.2)
SODIUM: 141 mmol/L (ref 134–144)

## 2017-01-26 LAB — THYROID CASCADE PROFILE: TSH: 0.195 u[IU]/mL — AB (ref 0.450–4.500)

## 2017-01-26 LAB — T3FREE: Triiodothyronine, Free, Serum: 2.5 pg/mL (ref 2.0–4.4)

## 2017-01-26 LAB — THYROXINE (T4) FREE, DIRECT: T4,Free (Direct): 1.62 ng/dL (ref 0.82–1.77)

## 2017-01-29 ENCOUNTER — Ambulatory Visit: Payer: BC Managed Care – PPO | Admitting: Physician Assistant

## 2017-01-29 ENCOUNTER — Encounter: Payer: Self-pay | Admitting: Gastroenterology

## 2017-01-29 ENCOUNTER — Ambulatory Visit (INDEPENDENT_AMBULATORY_CARE_PROVIDER_SITE_OTHER): Payer: BC Managed Care – PPO | Admitting: Gastroenterology

## 2017-01-29 VITALS — BP 108/70 | HR 100 | Ht 64.0 in | Wt 197.8 lb

## 2017-01-29 DIAGNOSIS — R0789 Other chest pain: Secondary | ICD-10-CM

## 2017-01-29 DIAGNOSIS — R1011 Right upper quadrant pain: Secondary | ICD-10-CM

## 2017-01-29 DIAGNOSIS — K219 Gastro-esophageal reflux disease without esophagitis: Secondary | ICD-10-CM

## 2017-01-29 MED ORDER — PANTOPRAZOLE SODIUM 40 MG PO TBEC: 40.0000 mg | DELAYED_RELEASE_TABLET | Freq: Two times a day (BID) | ORAL | 2 refills | Status: DC

## 2017-01-29 NOTE — Patient Instructions (Signed)
If you are age 53 or older, your body mass index should be between 23-30. Your Body mass index is 33.95 kg/m. If this is out of the aforementioned range listed, please consider follow up with your Primary Care Provider.  If you are age 57 or younger, your body mass index should be between 19-25. Your Body mass index is 33.95 kg/m. If this is out of the aformentioned range listed, please consider follow up with your Primary Care Provider.   You have been scheduled for an abdominal ultrasound at [facility] (1st floor of hospital) on 02/05/17 at 930. Please arrive 15 minutes prior to your appointment for registration. Make certain not to have anything to eat or drink 6 hours prior to your appointment. Should you need to reschedule your appointment, please contact radiology at [phone]. This test typically takes about 30 minutes to perform.  We have sent the following medications to your pharmacy for you to pick up at your convenience: Pantoprazole 40 mg  You have been given a note for work.  Thank you for choosing me and [facility].   Alonza Bogus, PA-C

## 2017-01-29 NOTE — Progress Notes (Signed)
01/29/2017 Marilyn [name] [id number] Feb 13, 1966   HISTORY OF PRESENT ILLNESS:  This is a 51 year old female who is known to Dr. Fuller Plan. She has been treated for reflux and is currently taking Prevacid 30 mg twice daily. She had EGD in November 2016 that showed a small hiatal hernia but was otherwise unremarkable. She presents to our office today for hospital follow-up.  Was recently hospitalized with complaints of upper abdominal pain and sharp pain radiating to her chest. She underwent some cardiac evaluation including CT angio of the chest, nuclear medicine stress test, etc. which was all unremarkable. They told her that her symptoms may be due to reflux.  She continues to complain of constant upper abdominal and chest discomfort. Describes it as pressure. Denies any sensation of heartburn/reflux.  Pain not necessarily worsened or triggered by eating.   Past Medical History:  Diagnosis Date  . 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/2007  . ASTHMA 01/30/2007  . Asthma   . Breast tumor    right; benign  . Chest pain 01/24/2017  . GERD 01/31/2007  . Hemorrhoids   . Hiatal hernia   . HTN (hypertension) 09/09/2011  . Hyperlipidemia   . HYPOTHYROIDISM 01/30/2007  . Mallory Mariel Kansky tear 04/2003   Past Surgical History:  Procedure Laterality Date  . BREAST LUMPECTOMY WITH RADIOACTIVE SEED LOCALIZATION Right 02/25/2015   Procedure: BREAST LUMPECTOMY WITH RADIOACTIVE SEED LOCALIZATION;  Surgeon: Alphonsa Overall, MD;  Location: [facility];  Service: General;  Laterality: Right;  . BREAST REDUCTION SURGERY  1986  . broken jaw    . CARPAL TUNNEL RELEASE  2001  . THYROIDECTOMY    . TUBAL LIGATION  1987    reports that she quit smoking about 36 years ago. Her smoking use included Cigarettes. She has a 0.75 pack-year smoking history. She has never used smokeless tobacco. She reports that she does not drink alcohol or use drugs. family history includes Asthma in her maternal grandfather and  mother; Breast cancer in her sister; Diabetes in her father and maternal grandmother; Heart attack in her maternal grandfather; Heart attack (age of onset: 44) in her maternal grandmother; Heart disease in her maternal grandmother; Hypertension in her father. Allergies  Allergen Reactions  . Tuna [Fish Allergy] Anaphylaxis  . Codeine Nausea And Vomiting  . Flonase [Fluticasone Propionate] Other (See Comments)    Nose bleeds      Outpatient Encounter Prescriptions as of 01/29/2017  Medication Sig  . acetaminophen (TYLENOL) 325 MG tablet Take 325 mg by mouth every 6 (six) hours as needed for moderate pain.  [name] albuterol (PROVENTIL HFA;VENTOLIN HFA) 108 (90 Base) MCG/ACT inhaler Inhale 2 puffs into the lungs every 4 (four) hours as needed for wheezing or shortness of breath (cough, shortness of breath or wheezing.).  [name] amLODipine (NORVASC) 10 MG tablet TAKE ONE TABLET BY MOUTH ONCE DAILY (Patient taking differently: Take 10 mg by mouth daily. )  . atorvastatin (LIPITOR) 40 MG tablet Take 1 tablet (40 mg total) by mouth daily.  [name] EPIPEN 2-PAK 0.3 MG/0.3ML SOAJ injection Inject 0.3 mg as directed as needed for anaphylaxis.  . hydrochlorothiazide (MICROZIDE) 12.5 MG capsule Take 1 capsule (12.5 mg total) by mouth daily. As needed for swelling  . hydroxypropyl methylcellulose / hypromellose (ISOPTO TEARS / GONIOVISC) 2.5 % ophthalmic solution Place 1 drop into both eyes at bedtime.  . lansoprazole (PREVACID) 30 MG capsule Take 1 capsule (30 mg total) by mouth 2 (two) times daily before a meal. (Patient  taking differently: Take 30 mg by mouth 2 (two) times daily as needed (heartburn). )  . levothyroxine (SYNTHROID, LEVOTHROID) 125 MCG tablet Take 1 tablet (125 mcg total) by mouth daily before breakfast.  . loratadine (CLARITIN) 10 MG tablet Take 10 mg by mouth daily as needed for allergies.  . polyvinyl alcohol (LIQUIFILM TEARS) 1.4 % ophthalmic solution Place 1 drop into both eyes at bedtime.  .  pantoprazole (PROTONIX) 40 MG tablet Take 1 tablet (40 mg total) by mouth 2 (two) times daily.   No facility-administered encounter medications on file as of 01/29/2017.      REVIEW OF SYSTEMS  : All other systems reviewed and negative except where noted in the History of Present Illness.   PHYSICAL EXAM: BP 108/70   Pulse 100   Ht 5\' 4"  (1.626 m)   Wt 197 lb 12.8 oz (89.7 kg)   LMP 03/18/2014   BMI 33.95 kg/m  General: Well developed black female in no acute distress Head: Normocephalic and atraumatic Eyes:  Sclerae anicteric, conjunctiva pink. Ears: Normal auditory acuity Lungs: Clear throughout to auscultation; no increased WOB. Heart: Regular rate and rhythm; no M/R/G. Abdomen: Soft, non-distended.  BS present.  Upper abdominal TTP. Musculoskeletal: Symmetrical with no gross deformities  Skin: No lesions on visible extremities Extremities: No edema  Neurological: Alert oriented x 4, grossly non-focal Psychological:  Alert and cooperative. Normal mood and affect  ASSESSMENT AND PLAN: *51 year old female with complaints of chest and upper abdominal pain.  Cardiac evaluation negative.  Thought maybe due to reflux, but is on prevacid 30 mg BID.  EGD 04/2015 unremarkable.  ? If this could be her gallbladder.  Will check ultrasound and may need HIDA scan.  Will change PPI to pantoprazole 40 mg BID and see if this helps.   CC:  Joretta Bachelor, [place]

## 2017-02-01 NOTE — Progress Notes (Signed)
Reviewed and agree with initial management plan.  Daney Moor T. Aikam Hellickson, MD FACG 

## 2017-02-05 ENCOUNTER — Ambulatory Visit (HOSPITAL_COMMUNITY)
Admission: RE | Admit: 2017-02-05 | Discharge: 2017-02-05 | Disposition: A | Discharge status: Home / Self Care | Payer: BC Managed Care – PPO | Source: Ambulatory Visit | Attending: Gastroenterology | Admitting: Gastroenterology

## 2017-02-05 ENCOUNTER — Other Ambulatory Visit: Payer: Self-pay | Admitting: Physician Assistant

## 2017-02-05 DIAGNOSIS — R0789 Other chest pain: Secondary | ICD-10-CM | POA: Insufficient documentation

## 2017-02-05 DIAGNOSIS — R7989 Other specified abnormal findings of blood chemistry: Secondary | ICD-10-CM

## 2017-02-05 DIAGNOSIS — K219 Gastro-esophageal reflux disease without esophagitis: Secondary | ICD-10-CM | POA: Insufficient documentation

## 2017-02-05 DIAGNOSIS — R1011 Right upper quadrant pain: Secondary | ICD-10-CM | POA: Diagnosis present

## 2017-02-09 ENCOUNTER — Emergency Department (HOSPITAL_COMMUNITY): Payer: BC Managed Care – PPO

## 2017-02-09 ENCOUNTER — Emergency Department (HOSPITAL_COMMUNITY)
Admission: EM | Admit: 2017-02-09 | Discharge: 2017-02-10 | Disposition: A | Discharge status: Home / Self Care | Payer: BC Managed Care – PPO | Attending: Emergency Medicine | Admitting: Emergency Medicine

## 2017-02-09 DIAGNOSIS — R0600 Dyspnea, unspecified: Secondary | ICD-10-CM

## 2017-02-09 DIAGNOSIS — Z87891 Personal history of nicotine dependence: Secondary | ICD-10-CM | POA: Insufficient documentation

## 2017-02-09 DIAGNOSIS — R0789 Other chest pain: Secondary | ICD-10-CM

## 2017-02-09 DIAGNOSIS — E039 Hypothyroidism, unspecified: Secondary | ICD-10-CM | POA: Diagnosis not present

## 2017-02-09 DIAGNOSIS — Z79899 Other long term (current) drug therapy: Secondary | ICD-10-CM | POA: Insufficient documentation

## 2017-02-09 DIAGNOSIS — J45909 Unspecified asthma, uncomplicated: Secondary | ICD-10-CM | POA: Diagnosis not present

## 2017-02-09 DIAGNOSIS — L608 Other nail disorders: Secondary | ICD-10-CM | POA: Insufficient documentation

## 2017-02-09 DIAGNOSIS — I10 Essential (primary) hypertension: Secondary | ICD-10-CM | POA: Insufficient documentation

## 2017-02-09 DIAGNOSIS — R0609 Other forms of dyspnea: Secondary | ICD-10-CM

## 2017-02-09 LAB — CBC
HEMATOCRIT: 37.3 % (ref 36.0–46.0)
HEMOGLOBIN: 12.5 g/dL (ref 12.0–15.0)
MCH: 27.2 pg (ref 26.0–34.0)
MCHC: 33.5 g/dL (ref 30.0–36.0)
MCV: 81.1 fL (ref 78.0–100.0)
Platelets: 242 10*3/uL (ref 150–400)
RBC: 4.6 MIL/uL (ref 3.87–5.11)
RDW: 13.3 % (ref 11.5–15.5)
WBC: 4.9 10*3/uL (ref 4.0–10.5)

## 2017-02-09 LAB — BASIC METABOLIC PANEL
ANION GAP: 9 (ref 5–15)
BUN: 10 mg/dL (ref 6–20)
CHLORIDE: 108 mmol/L (ref 101–111)
CO2: 22 mmol/L (ref 22–32)
Calcium: 8.7 mg/dL — ABNORMAL LOW (ref 8.9–10.3)
Creatinine, Ser: 0.86 mg/dL (ref 0.44–1.00)
GFR calc non Af Amer: >60 mL/min (ref >60)
Glucose, Bld: 164 mg/dL — ABNORMAL HIGH (ref 65–99)
Potassium: 3.5 mmol/L (ref 3.5–5.1)
Sodium: 139 mmol/L (ref 135–145)

## 2017-02-09 LAB — POCT I-STAT TROPONIN I: Troponin i, poc: 0 ng/mL (ref 0.00–0.08)

## 2017-02-09 MED ORDER — FAMOTIDINE 20 MG PO TABS
20.0000 mg | ORAL_TABLET | Freq: Once | ORAL | Status: AC
  Administered 2017-02-10: 20 mg via ORAL
  Filled 2017-02-09: qty 1

## 2017-02-09 NOTE — ED Triage Notes (Signed)
Pt c/o nail beds being a bluish tint since yesterday as well and the need to clear her throat.  She states today she started feeling chest tightness a few hours ago.  Denies n/v but has abdominal cramps and diarrhea x 3 today.

## 2017-02-09 NOTE — ED Provider Notes (Signed)
[facility] DEPT Provider Note   CSN: [id number] Arrival date & time: 02/09/17  1900     History   Chief Complaint Chief Complaint  Patient presents with  . Chest Pain  . discoloration to nail beds.    HPI Marilyn [name] is a 51 y.o. female with history of GERD, recent hospital admission for cardiac rule out, who presents with 2 day history of blue coloration to nails, central chest pressure that began today, and dyspnea on exertion. She has also had associated postnasal drip and clearing her throat a lot. Patient had one episode of lower abdominal pain and diarrhea this morning, which has resolved. Patient has been taking Protonix at home, her dose was doubled during the admission. Patient saw her primary care provider after discharge who ordered a nuclear stress test, which per review, was negative. She denies any current abdominal pain, nausea, vomiting, or urinary symptoms. She also denies any recent long trips or surgeries, cancer, exogenous estrogen use, history of blood clots. Patient reports that this morning she had french fries and a sausage for breakfast and then later and today she [employer] nuggets with her fries.  HPI  Past Medical History:  Diagnosis Date  . 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/2007  . ASTHMA 01/30/2007  . Asthma   . Breast tumor    right; benign  . Chest pain 01/24/2017  . GERD 01/31/2007  . Hemorrhoids   . Hiatal hernia   . HTN (hypertension) 09/09/2011  . Hyperlipidemia   . HYPOTHYROIDISM 01/30/2007  . Mack Guise tear 04/2003    Patient Active Problem List   Diagnosis Date Noted  . Atypical chest pain 01/23/2017  . Nausea & vomiting 07/16/2015  . RUQ abdominal pain 07/16/2015  . RLQ abdominal pain 07/15/2015  . Pain in lower back 11/30/2014  . Acute upper respiratory infection 05/05/2014  . Allergic rhinitis 05/05/2014  . Hoarseness 05/05/2014  . Dysphagia 05/05/2014  . Obesity 04/11/2014  . Edema, peripheral 04/11/2014  . Cough  12/20/2012  . Hypersomnolence 12/15/2012  . DOE (dyspnea on exertion) 12/15/2012  . Right shoulder pain 12/15/2012  . Abdominal pain 11/15/2011  . HTN (hypertension) 09/09/2011  . Neck pain 09/09/2011  . Preventative health care 04/30/2011  . HOT FLASHES 12/22/2009  . CHEST PAIN 01/29/2009  . HYPERTHYROIDISM 01/31/2007  . GERD 01/31/2007  . Hypothyroidism 01/30/2007  . Hyperlipidemia 01/30/2007  . Iron deficiency anemia 01/30/2007  . ASTHMA 01/30/2007    Past Surgical History:  Procedure Laterality Date  . BREAST LUMPECTOMY WITH RADIOACTIVE SEED LOCALIZATION Right 02/25/2015   Procedure: BREAST LUMPECTOMY WITH RADIOACTIVE SEED LOCALIZATION;  Surgeon: Alphonsa Overall, MD;  Location: [facility];  Service: General;  Laterality: Right;  . BREAST REDUCTION SURGERY  1986  . broken jaw    . CARPAL TUNNEL RELEASE  2001  . THYROIDECTOMY    . TUBAL LIGATION  1987    OB History    No data available       Home Medications    Prior to Admission medications   Medication Sig Start Date End Date Taking? Authorizing Provider  albuterol (PROVENTIL HFA;VENTOLIN HFA) 108 (90 Base) MCG/ACT inhaler Inhale 2 puffs into the lungs every 4 (four) hours as needed for wheezing or shortness of breath (cough, shortness of breath or wheezing.). 06/26/15  Yes English, Stephanie D, PA  amLODipine (NORVASC) 10 MG tablet TAKE ONE TABLET BY MOUTH ONCE DAILY Patient taking differently: Take 10 mg by mouth daily.  08/16/16  Yes English,  Dorian Heckle, PA  atorvastatin (LIPITOR) 40 MG tablet Take 1 tablet (40 mg total) by mouth daily. 08/27/16  Yes Ivar Drape D, PA  levothyroxine (SYNTHROID, LEVOTHROID) 125 MCG tablet Take 1 tablet (125 mcg total) by mouth daily before breakfast. 09/06/16  Yes English, Stephanie D, PA  pantoprazole (PROTONIX) 40 MG tablet Take 1 tablet (40 mg total) by mouth 2 (two) times daily. 01/29/17  Yes Zehr, Laban Emperor, PA-C  polyvinyl alcohol (LIQUIFILM TEARS) 1.4 %  ophthalmic solution Place 1 drop into both eyes at bedtime. 07/21/15  Yes Nita Sells, MD  EPIPEN 2-PAK 0.3 MG/0.3ML SOAJ injection Inject 0.3 mg as directed as needed for anaphylaxis. 11/21/16   [provider]  famotidine (PEPCID) 20 MG tablet Take 1 tablet (20 mg total) by mouth 2 (two) times daily. 02/10/17   Evalene Vath, Bea Graff, PA-C  hydrochlorothiazide (MICROZIDE) 12.5 MG capsule Take 1 capsule (12.5 mg total) by mouth daily. As needed for swelling Patient not taking: Reported on 02/09/2017 04/07/14   Biagio Borg, MD  lansoprazole (PREVACID) 30 MG capsule Take 1 capsule (30 mg total) by mouth 2 (two) times daily before a meal. Patient not taking: Reported on 02/09/2017 09/06/16   Joretta Bachelor, PA    Family History Family History  Problem Relation Age of Onset  . Hypertension Father   . Diabetes Father   . Asthma Mother   . Breast cancer Sister   . Diabetes Maternal Grandmother   . Heart disease Maternal Grandmother   . Heart attack Maternal Grandmother 60  . Asthma Maternal Grandfather   . Heart attack Maternal Grandfather   . Colon cancer Neg Hx     Social History Social History  Substance Use Topics  . Smoking status: Former Smoker    Packs/day: 0.25    Years: 3.00    Types: Cigarettes    Quit date: 06/04/1980  . Smokeless tobacco: Never Used  . Alcohol use No     Allergies   Tuna [fish allergy]; Codeine; and Flonase [fluticasone propionate]   Review of Systems Review of Systems  Constitutional: Negative for chills and fever.  HENT: Negative for facial swelling and sore throat.   Respiratory: Positive for shortness of breath.   Cardiovascular: Positive for chest pain.  Gastrointestinal: Positive for abdominal pain (resolved) and diarrhea (resolved). Negative for nausea and vomiting.  Genitourinary: Negative for dysuria.  Musculoskeletal: Negative for back pain.  Skin: Negative for rash and wound.  Neurological: Negative for headaches.    Psychiatric/Behavioral: The patient is not nervous/anxious.      Physical Exam Updated Vital Signs BP 104/81 (BP Location: Left Arm)   Pulse 76   Temp 98.4 F (36.9 C) (Oral)   Resp 18   Ht 5\' 4"  (1.626 m)   Wt 91.2 kg (201 lb)   LMP 03/18/2014   SpO2 100%   BMI 34.50 kg/m   Physical Exam  Constitutional: She appears well-developed and well-nourished. No distress.  HENT:  Head: Normocephalic and atraumatic.  Mouth/Throat: Oropharynx is clear and moist. No oropharyngeal exudate.  Eyes: Pupils are equal, round, and reactive to light. Conjunctivae are normal. Right eye exhibits no discharge. Left eye exhibits no discharge. No scleral icterus.  Neck: Normal range of motion. Neck supple. No thyromegaly present.  Cardiovascular: Normal rate, regular rhythm, normal heart sounds and intact distal pulses.  Exam reveals no gallop and no friction rub.   No murmur heard. Pulmonary/Chest: Effort normal and breath sounds normal. No stridor. No respiratory  distress. She has no wheezes. She has no rales. She exhibits tenderness (sternal).  Abdominal: Soft. Bowel sounds are normal. She exhibits no distension. There is no tenderness. There is no rebound and no guarding.  Musculoskeletal: She exhibits no edema.  Lymphadenopathy:    She has no cervical adenopathy.  Neurological: She is alert. Coordination normal.  Skin: Skin is warm and dry. No rash noted. She is not diaphoretic. No pallor.  Psychiatric: She has a normal mood and affect.  Nursing note and vitals reviewed.    ED Treatments / Results  Labs (all labs ordered are listed, but only abnormal results are displayed) Labs Reviewed  BASIC METABOLIC PANEL - Abnormal; Notable for the following:       Result Value   Glucose, Bld 164 (*)    Calcium 8.7 (*)    All other components within normal limits  CBC  I-STAT TROPONIN, ED  POCT I-STAT TROPONIN I  I-STAT TROPONIN, ED  POCT I-STAT TROPONIN I    EKG  EKG  Interpretation  Date/Time:  Saturday February 09 2017 19:15:36 EDT Ventricular Rate:  106 PR Interval:    QRS Duration: 76 QT Interval:  355 QTC Calculation: 472 R Axis:   71 Text Interpretation:  Sinus tachycardia Borderline low voltage, extremity leads Since last tracing rate faster Confirmed by Isla Pence [phone]) on 02/09/2017 10:28:45 PM       Radiology Dg Chest 2 View  Result Date: 02/09/2017 CLINICAL DATA:  Chest tightness EXAM: CHEST  2 VIEW COMPARISON:  01/23/2017 FINDINGS: The heart size and mediastinal contours are within normal limits. Both lungs are clear. Mild degenerative changes of the spine IMPRESSION: No active cardiopulmonary disease. Electronically Signed   By: Donavan Foil M.D.   On: 02/09/2017 19:32    Procedures Procedures (including critical care time)  Medications Ordered in ED Medications  famotidine (PEPCID) tablet 20 mg (not administered)     Initial Impression / Assessment and Plan / ED Course  I have reviewed the triage vital signs and the nursing notes.  Pertinent labs & imaging results that were available during my care of the patient were reviewed by me and considered in my medical decision making (see chart for details).     Patient with atypical chest pain and dyspnea on exertion after recent discharge from the hospital with negative cardiac rule out. Suspected to be GERD. Considering patient's symptoms, I agree this could be the case, especially after patient's diet today. Labs are unremarkable. Troponin is negative. CXR shows no active cardiopulmonary disease. Doubt PE, as no risk factors, the patient had a negative CT angiogram during his admission 2 weeks ago. We'll discharge home with additional Pepcid to Protonix at home. Follow-up to PCP for further evaluation. Patient nail discoloration not due to hypoxia, as patient 100% percent on room air at rest and on exertion throughout ED course. Strict return precautions discussed. Patient  vitals stable throughout ED course and discharged in satisfactory condition. I discussed patient case with Dr. Gilford Raid who guided the patient's management and agrees with plan.   Final Clinical Impressions(s) / ED Diagnoses   Final diagnoses:  Atypical chest pain  Dyspnea on exertion  Nail discoloration    New Prescriptions New Prescriptions   FAMOTIDINE (PEPCID) 20 MG TABLET    Take 1 tablet (20 mg total) by mouth 2 (two) times daily.     Frederica Kuster, PA-C 02/10/17 0017    Isla Pence, MD 02/10/17 [phone]

## 2017-02-09 NOTE — ED Notes (Signed)
I-stat trop resulted is 0.00

## 2017-02-09 NOTE — ED Notes (Signed)
Pt's oxygen saturation at 100% before ambulating, and stayed at 100% while ambulating.

## 2017-02-10 LAB — POCT I-STAT TROPONIN I: TROPONIN I, POC: 0 ng/mL (ref 0.00–0.08)

## 2017-02-10 MED ORDER — FAMOTIDINE 20 MG PO TABS: 20.0000 mg | ORAL_TABLET | Freq: Two times a day (BID) | ORAL | 0 refills | Status: DC

## 2017-02-10 NOTE — Discharge Instructions (Signed)
Medications: Pepcid  Treatment: Add Pepcid to your daily medication regimen, take twice daily. Avoid fried, fatty, greasy foods especially, as well as those outlined in the print out below. Make sure to read all of this and try to change your diet accordingly to help with your symptoms.  Follow-up: Please follow-up with your doctor this week for further evaluation and treatment of your symptoms. Please return to the emergency department if you develop any new or worsening symptoms.

## 2017-02-11 ENCOUNTER — Telehealth: Payer: Self-pay

## 2017-02-11 ENCOUNTER — Telehealth: Payer: Self-pay | Admitting: Family Medicine

## 2017-02-11 NOTE — Telephone Encounter (Signed)
Pt called wanting to talk English about her ED visit. I left a VM advising Pt to make a hospital follow-up per ED Notes.

## 2017-02-11 NOTE — Telephone Encounter (Signed)
Pt calling wanting stephanie to call her about ER visit

## 2017-02-18 ENCOUNTER — Other Ambulatory Visit: Payer: Self-pay | Admitting: Internal Medicine

## 2017-02-18 DIAGNOSIS — I10 Essential (primary) hypertension: Secondary | ICD-10-CM

## 2017-02-20 ENCOUNTER — Ambulatory Visit (INDEPENDENT_AMBULATORY_CARE_PROVIDER_SITE_OTHER): Payer: BC Managed Care – PPO | Admitting: Physician Assistant

## 2017-02-20 ENCOUNTER — Encounter: Payer: Self-pay | Admitting: Physician Assistant

## 2017-02-20 VITALS — BP 113/78 | HR 108 | Temp 98.5°F | Resp 18 | Ht 64.0 in | Wt 195.0 lb

## 2017-02-20 DIAGNOSIS — Z13228 Encounter for screening for other metabolic disorders: Secondary | ICD-10-CM

## 2017-02-20 DIAGNOSIS — E890 Postprocedural hypothyroidism: Secondary | ICD-10-CM | POA: Diagnosis not present

## 2017-02-20 DIAGNOSIS — Z13220 Encounter for screening for lipoid disorders: Secondary | ICD-10-CM | POA: Diagnosis not present

## 2017-02-20 DIAGNOSIS — Z0000 Encounter for general adult medical examination without abnormal findings: Secondary | ICD-10-CM | POA: Diagnosis not present

## 2017-02-20 DIAGNOSIS — Z124 Encounter for screening for malignant neoplasm of cervix: Secondary | ICD-10-CM | POA: Diagnosis not present

## 2017-02-20 DIAGNOSIS — Z1329 Encounter for screening for other suspected endocrine disorder: Secondary | ICD-10-CM

## 2017-02-20 DIAGNOSIS — Z113 Encounter for screening for infections with a predominantly sexual mode of transmission: Secondary | ICD-10-CM | POA: Diagnosis not present

## 2017-02-20 DIAGNOSIS — Z130 Encounter for screening for diseases of the blood and blood-forming organs and certain disorders involving the immune mechanism: Secondary | ICD-10-CM

## 2017-02-20 DIAGNOSIS — Z23 Encounter for immunization: Secondary | ICD-10-CM

## 2017-02-20 NOTE — Progress Notes (Signed)
PRIMARY CARE AT [facility] [address], [place] [address] [phone]  Date:  02/20/2017   Name:  Marilyn [name]   DOB:  [birth year]   MRN:  [id number]  PCP:  Joretta Bachelor, PA    History of Present Illness:  Marilyn [name] is a 51 y.o. female patient who presents to PCP with  Chief Complaint  Patient presents with  . Annual Exam    with pap      DIET: eating anything she wants.  Cutting back on greasy foods, fastfoods.  Getting vegetables daily.  Water intake 48-64+.  Rare sodas.  No teas.  Some fruit juices.    BM: "pretty ok".  Diarrhea and constipation vary.  Eating fig newton cookies, fruit to keep regular.    URINATION: normal.  Frequency but increased water.  No hematuria, dysuria.  SLEEP: sleeping well lately.  Trouble sleeping around work schedule thinking of this.    SOCIAL ACTIVITY: "nothing".  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  EtOH: none Illicit drug use none Alcohol use none  MENSES: Patient's last menstrual period was 03/18/2014.  No hotflashes.   She is attempting to lose weight at this time.  Watching her intake and hydrating very well at this time.     Wt Readings from Last 3 Encounters:  02/20/17 195 lb (88.5 kg)  02/09/17 201 lb (91.2 kg)  01/29/17 197 lb 12.8 oz (89.7 kg)    Patient Active Problem List   Diagnosis Date Noted  . Atypical chest pain 01/23/2017  . Nausea & vomiting 07/16/2015  . RUQ abdominal pain 07/16/2015  . RLQ abdominal pain 07/15/2015  . Pain in lower back 11/30/2014  . Acute upper respiratory infection 05/05/2014  . Allergic rhinitis 05/05/2014  . Hoarseness 05/05/2014  . Dysphagia 05/05/2014  . Obesity 04/11/2014  . Edema, peripheral 04/11/2014  . Cough 12/20/2012  . Hypersomnolence 12/15/2012  . DOE (dyspnea on exertion) 12/15/2012  . Right shoulder pain 12/15/2012  . Abdominal pain 11/15/2011  . HTN (hypertension) 09/09/2011  . Neck pain 09/09/2011  . Preventative health care 04/30/2011  .  HOT FLASHES 12/22/2009  . CHEST PAIN 01/29/2009  . HYPERTHYROIDISM 01/31/2007  . GERD 01/31/2007  . Hypothyroidism 01/30/2007  . Hyperlipidemia 01/30/2007  . Iron deficiency anemia 01/30/2007  . ASTHMA 01/30/2007    Past Medical History:  Diagnosis Date  . Allergy   . 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/2007  . ASTHMA 01/30/2007  . Asthma   . Breast tumor    right; benign  . Chest pain 01/24/2017  . GERD 01/31/2007  . Hemorrhoids   . Hiatal hernia   . HTN (hypertension) 09/09/2011  . Hyperlipidemia   . HYPOTHYROIDISM 01/30/2007  . Mallory Mariel Kansky tear 04/2003    Past Surgical History:  Procedure Laterality Date  . BREAST LUMPECTOMY WITH RADIOACTIVE SEED LOCALIZATION Right 02/25/2015   Procedure: BREAST LUMPECTOMY WITH RADIOACTIVE SEED LOCALIZATION;  Surgeon: Alphonsa Overall, MD;  Location: [facility];  Service: General;  Laterality: Right;  . BREAST REDUCTION SURGERY  1986  . BREAST SURGERY    . broken jaw    . CARPAL TUNNEL RELEASE  2001  . FRACTURE SURGERY    . THYROIDECTOMY    . TUBAL LIGATION  1987    Social History  Substance Use Topics  . Smoking status: Former Smoker    Packs/day: 0.25    Years: 3.00    Types: Cigarettes    Quit date: 06/04/1980  . Smokeless  tobacco: Never Used  . Alcohol use No    Family History  Problem Relation Age of Onset  . Hypertension Father   . Diabetes Father   . Hyperlipidemia Father   . Asthma Mother   . Breast cancer Sister   . Diabetes Maternal Grandmother   . Heart disease Maternal Grandmother   . Heart attack Maternal Grandmother 60  . Asthma Maternal Grandfather   . Heart attack Maternal Grandfather   . Colon cancer Neg Hx     Allergies  Allergen Reactions  . Tuna [Fish Allergy] Anaphylaxis  . Codeine Nausea And Vomiting  . Flonase [Fluticasone Propionate] Other (See Comments)    Nose bleeds    Medication list has been reviewed and updated.  Current Outpatient Prescriptions on File Prior to Visit   Medication Sig Dispense Refill  . albuterol (PROVENTIL HFA;VENTOLIN HFA) 108 (90 Base) MCG/ACT inhaler Inhale 2 puffs into the lungs every 4 (four) hours as needed for wheezing or shortness of breath (cough, shortness of breath or wheezing.). 1 Inhaler 1  . amLODipine (NORVASC) 10 MG tablet TAKE ONE TABLET BY MOUTH ONCE DAILY (Patient taking differently: Take 10 mg by mouth daily. ) 90 tablet 3  . atorvastatin (LIPITOR) 40 MG tablet Take 1 tablet (40 mg total) by mouth daily. 90 tablet 3  . EPIPEN 2-PAK 0.3 MG/0.3ML SOAJ injection Inject 0.3 mg as directed as needed for anaphylaxis.    . famotidine (PEPCID) 20 MG tablet Take 1 tablet (20 mg total) by mouth 2 (two) times daily. 30 tablet 0  . hydrochlorothiazide (MICROZIDE) 12.5 MG capsule Take 1 capsule (12.5 mg total) by mouth daily. As needed for swelling 90 capsule 3  . lansoprazole (PREVACID) 30 MG capsule Take 1 capsule (30 mg total) by mouth 2 (two) times daily before a meal. 60 capsule 11  . levothyroxine (SYNTHROID, LEVOTHROID) 125 MCG tablet Take 1 tablet (125 mcg total) by mouth daily before breakfast. 90 tablet 3  . pantoprazole (PROTONIX) 40 MG tablet Take 1 tablet (40 mg total) by mouth 2 (two) times daily. 60 tablet 2  . polyvinyl alcohol (LIQUIFILM TEARS) 1.4 % ophthalmic solution Place 1 drop into both eyes at bedtime. 15 mL 0   No current facility-administered medications on file prior to visit.     Review of Systems  Constitutional: Negative for chills and fever.  HENT: Negative for ear discharge, ear pain and sore throat.   Eyes: Negative for blurred vision and double vision.  Respiratory: Negative for cough, shortness of breath and wheezing.   Cardiovascular: Negative for chest pain (resolved with gerd restriction and med), palpitations and leg swelling.  Gastrointestinal: Positive for constipation and diarrhea (varied bowels). Negative for nausea and vomiting.  Genitourinary: Negative for dysuria, frequency and hematuria.   Skin: Negative for itching and rash.  Neurological: Negative for dizziness and headaches.   ROS otherwise unremarkable unless listed above.  Physical Examination: BP 113/78   Pulse (!) 108   Temp 98.5 F (36.9 C) (Oral)   Resp 18   Ht 5' 4" (1.626 m)   Wt 195 lb (88.5 kg)   LMP 03/18/2014   SpO2 97%   BMI 33.47 kg/m  Ideal Body Weight: Weight in (lb) to have BMI = 25: 145.3  Physical Exam  Constitutional: She is oriented to person, place, and time. She appears well-developed and well-nourished. No distress.  HENT:  Head: Normocephalic and atraumatic.  Right Ear: Tympanic membrane, external ear and ear canal normal.  Left  Ear: Tympanic membrane, external ear and ear canal normal.  Nose: Right sinus exhibits no maxillary sinus tenderness and no frontal sinus tenderness. Left sinus exhibits no maxillary sinus tenderness and no frontal sinus tenderness.  Mouth/Throat: Oropharynx is clear and moist. No uvula swelling. No oropharyngeal exudate, posterior oropharyngeal edema or posterior oropharyngeal erythema.  Eyes: Pupils are equal, round, and reactive to light. Conjunctivae and EOM are normal.  Neck: Normal range of motion. Neck supple. No thyromegaly present.  Cardiovascular: Normal rate, regular rhythm, normal heart sounds and intact distal pulses.  Exam reveals no gallop, no distant heart sounds and no friction rub.   No murmur heard. Pulmonary/Chest: Effort normal and breath sounds normal. No respiratory distress. She has no decreased breath sounds. She has no wheezes. She has no rhonchi.  Abdominal: Soft. Bowel sounds are normal. She exhibits no distension and no mass. There is no tenderness.  Musculoskeletal: Normal range of motion. She exhibits no edema or tenderness.  Lymphadenopathy:       Head (right side): No submandibular, no tonsillar, no preauricular and no posterior auricular adenopathy present.       Head (left side): No submandibular, no tonsillar, no preauricular  and no posterior auricular adenopathy present.    She has no cervical adenopathy.  Neurological: She is alert and oriented to person, place, and time. No cranial nerve deficit. She exhibits normal muscle tone. Coordination normal.  Skin: Skin is warm and dry. She is not diaphoretic.  Psychiatric: She has a normal mood and affect. Her behavior is normal.   Wt Readings from Last 3 Encounters:  02/20/17 195 lb (88.5 kg)  02/09/17 201 lb (91.2 kg)  01/29/17 197 lb 12.8 oz (89.7 kg)   Assessment and Plan: SHAVELL NORED is a 51 y.o. female who is here today for cc of  Chief Complaint  Patient presents with  . Annual Exam    with pap    Annual physical exam - Plan: TSH, CBC, CMP14+EGFR, Lipid panel, Flu Vaccine QUAD 36+ mos IM, Pap IG, CT/NG w/ reflex HPV when ASC-U, HIV antibody, RPR  Screening for deficiency anemia - Plan: CBC  Screening for lipid disorders - Plan: Lipid panel  Screening for metabolic disorder - Plan: CMP14+EGFR  Screening for thyroid disorder  Status post complete thyroidectomy - Plan: TSH  Need for prophylactic vaccination and inoculation against influenza - Plan: Flu Vaccine QUAD 36+ mos IM  Encounter for Papanicolaou smear for cervical cancer screening - Plan: Pap IG, CT/NG w/ reflex HPV when ASC-U  Screening for STD (sexually transmitted disease) - Plan: HIV antibody, RPR  Ivar Drape, PA-C [facility] 9/24/20187:59 AM

## 2017-02-20 NOTE — Telephone Encounter (Signed)
Please see about the referral I had made for endocrinology?Marland KitchenMarland KitchenMarland KitchenMarland KitchenPlease let the patient know and myself the stat on this

## 2017-02-20 NOTE — Patient Instructions (Addendum)
Please contact the endocrinologist regarding your thyroid asap. Endocrinology - [facility]  [phone], [place] [phone]  [phone]   I will let you know what we will do with the synthroid, once I get your lab results.   Keeping You Healthy  Get These Tests  Blood Pressure- Have your blood pressure checked by your healthcare provider at least once a year.  Normal blood pressure is 120/80.  Weight- Have your body mass index (BMI) calculated to screen for obesity.  BMI is a measure of body fat based on height and weight.  You can calculate your own BMI at [employer]  Cholesterol- Have your cholesterol checked every year.  Diabetes- Have your blood sugar checked every year if you have high blood pressure, high cholesterol, a family history of diabetes or if you are overweight.  Pap Test - Have a pap test every 1 to 5 years if you have been sexually active.  If you are older than 65 and recent pap tests have been normal you may not need additional pap tests.  In addition, if you have had a hysterectomy  for benign disease additional pap tests are not necessary.  Mammogram-Yearly mammograms are essential for early detection of breast cancer  Screening for Colon Cancer- Colonoscopy starting at age 33. Screening may begin sooner depending on your family history and other health conditions.  Follow up colonoscopy as directed by your Gastroenterologist.  Screening for Osteoporosis- Screening begins at age 64 with bone density scanning, sooner if you are at higher risk for developing Osteoporosis.  Get these medicines  Calcium with Vitamin D- Your body requires 1200-1500 mg of Calcium a day and [phone] IU of Vitamin D a day.  You can only absorb 500 mg of Calcium at a time therefore Calcium must be taken in 2 or 3 separate doses throughout the day.  Hormones- Hormone therapy has been associated with increased risk for certain cancers and heart  disease.  Talk to your healthcare provider about if you need relief from menopausal symptoms.  Aspirin- Ask your healthcare provider about taking Aspirin to prevent Heart Disease and Stroke.  Get these Immuniztions  Flu shot- Every fall  Pneumonia shot- Once after the age of 56; if you are younger ask your healthcare provider if you need a pneumonia shot.  Tetanus- Every ten years.  Zostavax- Once after the age of 49 to prevent shingles.  Take these steps  Don't smoke- Your healthcare provider can help you quit. For tips on how to quit, ask your healthcare provider or go to www.smokefree.gov or call 1-800 QUIT-NOW.  Be physically active- Exercise 5 days a week for a minimum of 30 minutes.  If you are not already physically active, start slow and gradually work up to 30 minutes of moderate physical activity.  Try walking, dancing, bike riding, swimming, etc.  Eat a healthy diet- Eat a variety of healthy foods such as fruits, vegetables, whole grains, low fat milk, low fat cheeses, yogurt, lean meats, chicken, fish, eggs, dried beans, tofu, etc.  For more information go to www.thenutritionsource.org  Dental visit- Brush and floss teeth twice daily; visit your dentist twice a year.  Eye exam- Visit your Optometrist or Ophthalmologist yearly.  Drink alcohol in moderation- Limit alcohol intake to one drink or less a day.  Never drink and drive.  Depression- Your emotional health is as important as your physical health.  If you're feeling down or losing interest in things  you normally enjoy, please talk to your healthcare provider.  Seat Belts- can save your life; always wear one  Smoke/Carbon Monoxide detectors- These detectors need to be installed on the appropriate level of your home.  Replace batteries at least once a year.  Violence- If anyone is threatening or hurting you, please tell your healthcare provider.  Living Will/ Health care power of attorney- Discuss with your  healthcare provider and family.   IF you received an x-ray today, you will receive an invoice from [facility]. Please contact [facility] at [phone] with questions or concerns regarding your invoice.   IF you received labwork today, you will receive an invoice from [facility]. Please contact [facility] at [phone] with questions or concerns regarding your invoice.   Our billing staff will not be able to assist you with questions regarding bills from these companies.  You will be contacted with the lab results as soon as they are available. The fastest way to get your results is to activate your My Chart account. Instructions are located on the last page of this paperwork. If you have not heard from [place] regarding the results in 2 weeks, please contact this office.

## 2017-02-21 LAB — CBC
Hematocrit: 38.9 % (ref 34.0–46.6)
Hemoglobin: 12.8 g/dL (ref 11.1–15.9)
MCH: 26.6 pg (ref 26.6–33.0)
MCHC: 32.9 g/dL (ref 31.5–35.7)
MCV: 81 fL (ref 79–97)
Platelets: 249 10*3/uL (ref 150–379)
RBC: 4.81 x10E6/uL (ref 3.77–5.28)
RDW: 14.8 % (ref 12.3–15.4)
WBC: 4.1 10*3/uL (ref 3.4–10.8)

## 2017-02-21 LAB — CMP14+EGFR
A/G RATIO: 1.4 (ref 1.2–2.2)
ALBUMIN: 4.5 g/dL (ref 3.5–5.5)
ALT: 20 IU/L (ref 0–32)
AST: 22 IU/L (ref 0–40)
Alkaline Phosphatase: 79 IU/L (ref 39–117)
BILIRUBIN TOTAL: 0.3 mg/dL (ref 0.0–1.2)
BUN / CREAT RATIO: 13 (ref 9–23)
BUN: 11 mg/dL (ref 6–24)
CHLORIDE: 105 mmol/L (ref 96–106)
CO2: 23 mmol/L (ref 20–29)
Calcium: 8.7 mg/dL (ref 8.7–10.2)
Creatinine, Ser: 0.84 mg/dL (ref 0.57–1.00)
GFR calc Af Amer: 92 mL/min/{1.73_m2} (ref >59)
GFR calc non Af Amer: 80 mL/min/{1.73_m2} (ref >59)
Globulin, Total: 3.3 g/dL (ref 1.5–4.5)
Glucose: 99 mg/dL (ref 65–99)
Potassium: 3.9 mmol/L (ref 3.5–5.2)
SODIUM: 142 mmol/L (ref 134–144)
TOTAL PROTEIN: 7.8 g/dL (ref 6.0–8.5)

## 2017-02-21 LAB — TSH: TSH: 0.174 u[IU]/mL — ABNORMAL LOW (ref 0.450–4.500)

## 2017-02-21 LAB — PAP IG, CT-NG, RFX HPV ASCU
Chlamydia, Nuc. Acid Amp: NEGATIVE
GONOCOCCUS BY NUCLEIC ACID AMP: NEGATIVE
PAP SMEAR COMMENT: 0

## 2017-02-21 LAB — LIPID PANEL
CHOLESTEROL TOTAL: 205 mg/dL — AB (ref 100–199)
Chol/HDL Ratio: 5.1 ratio — ABNORMAL HIGH (ref 0.0–4.4)
HDL: 40 mg/dL (ref >39)
LDL Calculated: 140 mg/dL — ABNORMAL HIGH (ref 0–99)
TRIGLYCERIDES: 127 mg/dL (ref 0–149)
VLDL Cholesterol Cal: 25 mg/dL (ref 5–40)

## 2017-02-21 LAB — HIV ANTIBODY (ROUTINE TESTING W REFLEX): HIV Screen 4th Generation wRfx: NONREACTIVE

## 2017-02-21 LAB — RPR: RPR Ser Ql: NONREACTIVE

## 2017-02-24 ENCOUNTER — Other Ambulatory Visit: Payer: Self-pay | Admitting: Internal Medicine

## 2017-02-24 DIAGNOSIS — I10 Essential (primary) hypertension: Secondary | ICD-10-CM

## 2017-02-26 ENCOUNTER — Telehealth: Payer: Self-pay | Admitting: Physician Assistant

## 2017-02-26 DIAGNOSIS — R7989 Other specified abnormal findings of blood chemistry: Secondary | ICD-10-CM

## 2017-02-26 DIAGNOSIS — E890 Postprocedural hypothyroidism: Secondary | ICD-10-CM

## 2017-02-26 DIAGNOSIS — Z9009 Acquired absence of other part of head and neck: Secondary | ICD-10-CM

## 2017-02-26 NOTE — Telephone Encounter (Signed)
t would like a refill on her levothyroxine (SYNTHROID, LEVOTHROID) 125 MCG tablet [[id number] P. She was told she would be getting a 100mg  tablet. Please advise at [phone]

## 2017-02-26 NOTE — Telephone Encounter (Signed)
Please advise is patient on 142mcg or 114mcg ?

## 2017-02-28 MED ORDER — LEVOTHYROXINE SODIUM 100 MCG PO TABS: 100.0000 ug | ORAL_TABLET | Freq: Every day | ORAL | 0 refills | Status: DC

## 2017-02-28 NOTE — Telephone Encounter (Signed)
Please advise patient that I am switching to 181mcg.  I will have her return in 6 weeks.

## 2017-02-28 NOTE — Telephone Encounter (Signed)
Patient informed. 

## 2017-03-07 ENCOUNTER — Telehealth: Payer: Self-pay | Admitting: General Practice

## 2017-03-07 ENCOUNTER — Telehealth: Payer: Self-pay | Admitting: Physician Assistant

## 2017-03-07 NOTE — Telephone Encounter (Signed)
At [facility] to see if he will accept it or not.[name]

## 2017-03-07 NOTE — Telephone Encounter (Signed)
Primary care at [facility] called in reference to referral for patient being denied by Dr. Loanne Drilling. Patient would like to see Dr. Dwyane Dee if possible. Please call Primary care at [facility] and advise.

## 2017-03-07 NOTE — Telephone Encounter (Signed)
[facility] called stated that they are going to get Dr.Kumar to look at pt referral

## 2017-03-25 ENCOUNTER — Encounter: Payer: Self-pay | Admitting: Physician Assistant

## 2017-03-25 ENCOUNTER — Ambulatory Visit (INDEPENDENT_AMBULATORY_CARE_PROVIDER_SITE_OTHER): Payer: BC Managed Care – PPO | Admitting: Physician Assistant

## 2017-03-25 ENCOUNTER — Ambulatory Visit (INDEPENDENT_AMBULATORY_CARE_PROVIDER_SITE_OTHER): Payer: BC Managed Care – PPO

## 2017-03-25 VITALS — BP 116/76 | HR 112 | Temp 98.8°F | Resp 16 | Ht 62.0 in | Wt 196.0 lb

## 2017-03-25 DIAGNOSIS — Z111 Encounter for screening for respiratory tuberculosis: Secondary | ICD-10-CM | POA: Diagnosis not present

## 2017-03-25 DIAGNOSIS — R05 Cough: Secondary | ICD-10-CM | POA: Diagnosis not present

## 2017-03-25 DIAGNOSIS — J22 Unspecified acute lower respiratory infection: Secondary | ICD-10-CM

## 2017-03-25 DIAGNOSIS — R058 Other specified cough: Secondary | ICD-10-CM

## 2017-03-25 MED ORDER — AZITHROMYCIN 250 MG PO TABS: ORAL_TABLET | ORAL | 0 refills | Status: DC

## 2017-03-25 MED ORDER — BENZONATATE 100 MG PO CAPS: 100.0000 mg | ORAL_CAPSULE | Freq: Three times a day (TID) | ORAL | 0 refills | Status: DC | PRN

## 2017-03-25 MED ORDER — GUAIFENESIN ER 1200 MG PO TB12: 1.0000 | ORAL_TABLET | Freq: Two times a day (BID) | ORAL | 1 refills | Status: DC | PRN

## 2017-03-25 NOTE — Progress Notes (Signed)
  Tuberculosis Risk Questionnaire  1. No Were you born outside the [place] in one of the following parts of the world: [place], [place], [place], [place] America or Eastern [place]?    2. No Have you traveled outside the [place] and lived for more than one month in one of the following parts of the world: [place], [place], [place], [place] or [place]?    3. No Do you have a compromised immune system such as from any of the following conditions:HIV/AIDS, organ or bone marrow transplantation, diabetes, immunosuppressive medicines (e.g. Prednisone, Remicaide), leukemia, lymphoma, cancer of the head or neck, gastrectomy or jejunal bypass, end-stage renal disease (on dialysis), or silicosis?     4. 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    5. No Have you ever: injected illegal drugs, used crack cocaine, lived in a homeless shelter  or been in jail or prison?     6. No Have you ever been exposed to anyone with infectious tuberculosis?  7. No Have you ever had a BCG vaccine? (BCG is a vaccine for tuberculosis  (TB) used in OTHER countries, NOT in the [place]).  8. No Have you ever been advised by a health care provider NOT to have a TB skin test?  9. No Have you ever had a POSITIVE TB skin test?  IF SO, when? n/a  IF SO, were you treated with INH? n/a  IF SO, where? n/a  Tuberculosis Symptom Questionnaire  Do you currently have any of the following symptoms?  1. No Unexplained cough lasting more than 3 weeks?   2. No Unexplained fever lasting more than 3 weeks.   3. No Night Sweats (sweating that leaves the bedclothes and sheets wet)     4. No Shortness of Breath   5. No Chest Pain   6. No Unintentional weight loss    7. No Unexplained fatigue (very tired for no reason)    

## 2017-03-25 NOTE — Telephone Encounter (Signed)
Please check in on her endocrinology referral.

## 2017-03-25 NOTE — Progress Notes (Signed)
PRIMARY CARE AT [facility] [address] [place] [address] [phone]  Date:  03/25/2017   Name:  Marilyn [name]   DOB:  [birth year]   MRN:  [id number]  PCP:  Joretta Bachelor, PA    History of Present Illness:  Marilyn [name] is a 51 y.o. female patient who presents to PCP with  Chief Complaint  Patient presents with  . Cough    productive-yellow, green and red mucus since 03/24/2017  . Laryngitis     Coughing yellow, green, and bloody mucus since yesterday.  Sore throat.  Nasal congestion, ear discomfort.  She woke up 3 days ago, with difficulty breathing.   She has tried the albuterol once per day.   Chills started last night.   She also needs a TB test for work.  Questionnaire listed in separate note...  Patient Active Problem List   Diagnosis Date Noted  . Atypical chest pain 01/23/2017  . Nausea & vomiting 07/16/2015  . RUQ abdominal pain 07/16/2015  . RLQ abdominal pain 07/15/2015  . Pain in lower back 11/30/2014  . Acute upper respiratory infection 05/05/2014  . Allergic rhinitis 05/05/2014  . Hoarseness 05/05/2014  . Dysphagia 05/05/2014  . Obesity 04/11/2014  . Edema, peripheral 04/11/2014  . Cough 12/20/2012  . Hypersomnolence 12/15/2012  . DOE (dyspnea on exertion) 12/15/2012  . Right shoulder pain 12/15/2012  . Abdominal pain 11/15/2011  . HTN (hypertension) 09/09/2011  . Neck pain 09/09/2011  . Preventative health care 04/30/2011  . HOT FLASHES 12/22/2009  . CHEST PAIN 01/29/2009  . HYPERTHYROIDISM 01/31/2007  . GERD 01/31/2007  . Hypothyroidism 01/30/2007  . Hyperlipidemia 01/30/2007  . Iron deficiency anemia 01/30/2007  . ASTHMA 01/30/2007    Past Medical History:  Diagnosis Date  . Allergy   . 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/2007  . ASTHMA 01/30/2007  . Asthma   . Breast tumor    right; benign  . Chest pain 01/24/2017  . GERD 01/31/2007  . Hemorrhoids   . Hiatal hernia   . HTN (hypertension) 09/09/2011  . Hyperlipidemia   .  HYPOTHYROIDISM 01/30/2007  . Mallory Mariel Kansky tear 04/2003    Past Surgical History:  Procedure Laterality Date  . BREAST LUMPECTOMY WITH RADIOACTIVE SEED LOCALIZATION Right 02/25/2015   Procedure: BREAST LUMPECTOMY WITH RADIOACTIVE SEED LOCALIZATION;  Surgeon: Alphonsa Overall, MD;  Location: [facility];  Service: General;  Laterality: Right;  . BREAST REDUCTION SURGERY  1986  . BREAST SURGERY    . broken jaw    . CARPAL TUNNEL RELEASE  2001  . FRACTURE SURGERY    . THYROIDECTOMY    . TUBAL LIGATION  1987    Social History  Substance Use Topics  . Smoking status: Former Smoker    Packs/day: 0.25    Years: 3.00    Types: Cigarettes    Quit date: 06/04/1980  . Smokeless tobacco: Never Used  . Alcohol use No    Family History  Problem Relation Age of Onset  . Hypertension Father   . Diabetes Father   . Hyperlipidemia Father   . Asthma Mother   . Breast cancer Sister   . Diabetes Maternal Grandmother   . Heart disease Maternal Grandmother   . Heart attack Maternal Grandmother 60  . Asthma Maternal Grandfather   . Heart attack Maternal Grandfather   . Colon cancer Neg Hx     Allergies  Allergen Reactions  . Tuna [Fish Allergy] Anaphylaxis  . Codeine Nausea And  Vomiting  . Flonase [Fluticasone Propionate] Other (See Comments)    Nose bleeds    Medication list has been reviewed and updated.  Current Outpatient Prescriptions on File Prior to Visit  Medication Sig Dispense Refill  . albuterol (PROVENTIL HFA;VENTOLIN HFA) 108 (90 Base) MCG/ACT inhaler Inhale 2 puffs into the lungs every 4 (four) hours as needed for wheezing or shortness of breath (cough, shortness of breath or wheezing.). 1 Inhaler 1  . amLODipine (NORVASC) 10 MG tablet TAKE ONE TABLET BY MOUTH DAILY 90 tablet 0  . atorvastatin (LIPITOR) 40 MG tablet Take 1 tablet (40 mg total) by mouth daily. 90 tablet 3  . EPIPEN 2-PAK 0.3 MG/0.3ML SOAJ injection Inject 0.3 mg as directed as needed for  anaphylaxis.    . famotidine (PEPCID) 20 MG tablet Take 1 tablet (20 mg total) by mouth 2 (two) times daily. 30 tablet 0  . hydrochlorothiazide (MICROZIDE) 12.5 MG capsule Take 1 capsule (12.5 mg total) by mouth daily. As needed for swelling 90 capsule 3  . lansoprazole (PREVACID) 30 MG capsule Take 1 capsule (30 mg total) by mouth 2 (two) times daily before a meal. 60 capsule 11  . levothyroxine (SYNTHROID, LEVOTHROID) 100 MCG tablet Take 1 tablet (100 mcg total) by mouth daily. 90 tablet 0  . pantoprazole (PROTONIX) 40 MG tablet Take 1 tablet (40 mg total) by mouth 2 (two) times daily. 60 tablet 2  . polyvinyl alcohol (LIQUIFILM TEARS) 1.4 % ophthalmic solution Place 1 drop into both eyes at bedtime. 15 mL 0   No current facility-administered medications on file prior to visit.     ROS ROS otherwise unremarkable unless listed above.  Physical Examination: BP 116/76 (BP Location: Left Arm, Patient Position: Sitting, Cuff Size: Large)   Pulse (!) 112   Temp 98.8 F (37.1 C) (Oral)   Resp 16   Ht 5\' 2"  (1.575 m)   Wt 196 lb (88.9 kg)   LMP 03/18/2014   SpO2 97%   BMI 35.85 kg/m  Ideal Body Weight: Weight in (lb) to have BMI = 25: 136.4  Physical Exam  Constitutional: She is oriented to person, place, and time. She appears well-developed and well-nourished. No distress.  HENT:  Head: Normocephalic and atraumatic.  Right Ear: Tympanic membrane, external ear and ear canal normal.  Left Ear: Tympanic membrane, external ear and ear canal normal.  Nose: Mucosal edema and rhinorrhea present. Right sinus exhibits no maxillary sinus tenderness and no frontal sinus tenderness. Left sinus exhibits no maxillary sinus tenderness and no frontal sinus tenderness.  Mouth/Throat: No uvula swelling. No oropharyngeal exudate, posterior oropharyngeal edema or posterior oropharyngeal erythema.  Eyes: Pupils are equal, round, and reactive to light. Conjunctivae and EOM are normal.  Cardiovascular:  Normal rate, regular rhythm, normal heart sounds and intact distal pulses.  Exam reveals no gallop, no distant heart sounds and no friction rub.   No murmur heard. Pulmonary/Chest: Effort normal and breath sounds normal. No respiratory distress. She has no decreased breath sounds. She has no wheezes. She has no rhonchi.  Lymphadenopathy:       Head (right side): No submandibular, no tonsillar, no preauricular and no posterior auricular adenopathy present.       Head (left side): No submandibular, no tonsillar, no preauricular and no posterior auricular adenopathy present.  Neurological: She is alert and oriented to person, place, and time.  Skin: She is not diaphoretic.  Psychiatric: She has a normal mood and affect. Her behavior is normal.  Dg Chest 2 View  Result Date: 03/25/2017 CLINICAL DATA:  productive cough bloody sputum. no findings wiht auscultation some difficulty with breathing. EXAM: CHEST  2 VIEW COMPARISON:  Chest x-ray dated 02/09/2017. FINDINGS: Heart size and mediastinal contours are within normal limits. Lungs are clear. No pleural effusion or pneumothorax seen. Degenerative spurring noted within the midthoracic spine. No acute or suspicious osseous finding. IMPRESSION: No active cardiopulmonary disease. No evidence of pneumonia or pulmonary edema. Electronically Signed   By: Franki Cabot M.D.   On: 03/25/2017 14:50     Assessment and Plan: Marilyn [name] is a 51 y.o. female who is here today for cc of  Chief Complaint  Patient presents with  . Cough    productive-yellow, green and red mucus since 03/24/2017  . Laryngitis   --will proceed to treat, and given supportive treatment as well.  Lower respiratory infection (e.g., bronchitis, pneumonia, pneumonitis, pulmonitis) - Plan: azithromycin (ZITHROMAX) 250 MG tablet, Guaifenesin (MUCINEX MAXIMUM STRENGTH) 1200 MG TB12, benzonatate (TESSALON) 100 MG capsule  Productive cough - Plan: DG Chest 2 View  Encounter for  PPD test - Plan: TB Skin Test  Ivar Drape, PA-C [facility] 10/23/20189:22 AM

## 2017-03-25 NOTE — Patient Instructions (Addendum)
Please make sure you are hydrating well with 64 oz of water.   Please take medication as prescribed.     IF you received an x-ray today, you will receive an invoice from [facility]. Please contact [facility] at [phone] with questions or concerns regarding your invoice.   IF you received labwork today, you will receive an invoice from [facility]. Please contact [facility] at [phone] with questions or concerns regarding your invoice.   Our billing staff will not be able to assist you with questions regarding bills from these companies.  You will be contacted with the lab results as soon as they are available. The fastest way to get your results is to activate your My Chart account. Instructions are located on the last page of this paperwork. If you have not heard from [place] regarding the results in 2 weeks, please contact this office.

## 2017-03-26 NOTE — Telephone Encounter (Signed)
[facility] approved pt referral they have called pt and are waiting on a call back from pt to schedule her appt.[name]

## 2017-03-28 ENCOUNTER — Telehealth: Payer: Self-pay | Admitting: Physician Assistant

## 2017-03-28 NOTE — Telephone Encounter (Signed)
Pt called stated that she forgot when she was suppose to come back and get tb read pt got tb on 10/22 and per Jenny Reichmann she is out of her window and will need to do another tb read because she was suppose to come back on Wednesday 10/24 and she missed it.. Pt also complained about her left leg, stated she can barley move it and stated that she is in a lot of pain offered pt to see if she wanted to set up an appt and we didn't have any appts for this particular day so she didn't want to see about another day.[name]

## 2017-03-29 ENCOUNTER — Ambulatory Visit (INDEPENDENT_AMBULATORY_CARE_PROVIDER_SITE_OTHER): Payer: BC Managed Care – PPO

## 2017-03-29 ENCOUNTER — Encounter: Payer: Self-pay | Admitting: Urgent Care

## 2017-03-29 ENCOUNTER — Ambulatory Visit (INDEPENDENT_AMBULATORY_CARE_PROVIDER_SITE_OTHER): Payer: BC Managed Care – PPO | Admitting: Urgent Care

## 2017-03-29 VITALS — HR 110 | Temp 98.6°F | Resp 16 | Ht 62.0 in | Wt 196.4 lb

## 2017-03-29 DIAGNOSIS — M25562 Pain in left knee: Secondary | ICD-10-CM | POA: Diagnosis not present

## 2017-03-29 DIAGNOSIS — Z111 Encounter for screening for respiratory tuberculosis: Secondary | ICD-10-CM

## 2017-03-29 DIAGNOSIS — M25462 Effusion, left knee: Secondary | ICD-10-CM

## 2017-03-29 MED ORDER — KETOROLAC TROMETHAMINE 60 MG/2ML IM SOLN
60.0000 mg | Freq: Once | INTRAMUSCULAR | Status: AC
  Administered 2017-03-29: 60 mg via INTRAMUSCULAR

## 2017-03-29 MED ORDER — NAPROXEN SODIUM 550 MG PO TABS: 550.0000 mg | ORAL_TABLET | Freq: Two times a day (BID) | ORAL | 1 refills | Status: DC

## 2017-03-29 NOTE — Progress Notes (Signed)
  MRN: [id number] DOB: 1965/07/06  Subjective:   Marilyn [name] is a 51 y.o. female presenting for chief complaint of Leg Pain (patient present to office with L leg pain since yesterday. Patient states that pain ranges from knee to hip, also states that she can not bend her leg)  Reports 1 day history of left knee pain that radiates up toward her left hip. Pain is intermittent, aching type sensation, has sharp pain when she applies pressure. Has tried APAP with minimal relief. Denies fever, trauma, falls, swelling, bruising, redness, warmth, playing sports, twisting her knee. Denies history of arthritis, knee injuries, gout.  Marilyn [name] has a current medication list which includes the following prescription(s): albuterol, amlodipine, atorvastatin, azithromycin, benzonatate, epipen 2-pak, famotidine, guaifenesin, hydrochlorothiazide, lansoprazole, levothyroxine, pantoprazole, and polyvinyl alcohol. Also is allergic to tuna [fish allergy]; codeine; and flonase [fluticasone propionate].  Marilyn [name]  has a past medical history of Allergy; ANEMIA-IRON DEFICIENCY (01/30/2007); ASTHMA (01/30/2007); Asthma; Breast tumor; Chest pain (01/24/2017); GERD (01/31/2007); Hemorrhoids; Hiatal hernia; HTN (hypertension) (09/09/2011); Hyperlipidemia; HYPOTHYROIDISM (01/30/2007); and Mallory Mariel Kansky tear (04/2003). Also  has a past surgical history that includes Tubal ligation (1987); broken jaw; Breast reduction surgery (1986); Carpal tunnel release (2001); Thyroidectomy; Breast lumpectomy with radioactive seed localization (Right, 02/25/2015); Fracture surgery; and Breast surgery.  Objective:   Vitals: Pulse (!) 110   Temp 98.6 F (37 C) (Oral)   Resp 16   Ht 5\' 2"  (1.575 m)   Wt 196 lb 6.4 oz (89.1 kg)   LMP 03/18/2014   SpO2 98%   BMI 35.92 kg/m   Physical Exam  Constitutional: She is oriented to person, place, and time. She appears well-developed and well-nourished.  Cardiovascular: Normal rate.   Pulmonary/Chest:  Effort normal.  Musculoskeletal:       Left knee: She exhibits decreased range of motion (flexion). She exhibits no swelling, no effusion, no ecchymosis, no deformity, no laceration, no erythema, normal alignment, normal patellar mobility and no bony tenderness. Tenderness found. Medial joint line and patellar tendon tenderness noted. No lateral joint line tenderness noted.  Exam difficult to perform due to patient's pain.  Neurological: She is alert and oriented to person, place, and time.   Dg Knee Complete 4 Views Left  Result Date: 03/29/2017 CLINICAL DATA:  Patient has acute pain. EXAM: LEFT KNEE - COMPLETE 4+ VIEW COMPARISON:  None. FINDINGS: No evidence of fracture, or dislocation. Positive for effusion. No evidence of arthropathy or other focal bone abnormality. Soft tissues are unremarkable. IMPRESSION: Positive for effusion.  No fracture or dislocation. Electronically Signed   By: Staci Righter M.D.   On: 03/29/2017 10:14   Assessment and Plan :   This case was precepted with Dr. Tamala Julian.   1. Acute pain of left knee 2. Effusion of left knee - IM toradol in clinic. Start naproxen, rest knee. Labs are pending, will decide on further treatment and plan thereafter. Patient is not a good candidate for drainage of effusion due to pain threshold. Return-to-clinic precautions discussed, patient verbalized understanding.   Marilyn Eagles, PA-C [facility] [phone] 03/29/2017  9:42 AM

## 2017-03-29 NOTE — Patient Instructions (Addendum)
Knee Effusion Knee effusion means that you have excess fluid in your knee joint. This can cause pain and swelling in your knee. This may make your knee more difficult to bend and move. That is because there is increased pain and pressure in the joint. If there is fluid in your knee, it often means that something is wrong inside your knee, such as severe arthritis, abnormal inflammation, or an infection. Another common cause of knee effusion is an injury to the knee muscles, ligaments, or cartilage. Follow these instructions at home:  Use crutches as directed by your health care provider.  Wear a knee brace as directed by your health care provider.  Apply ice to the swollen area: ? Put ice in a plastic bag. ? Place a towel between your skin and the bag. ? Leave the ice on for 20 minutes, 2-3 times per day.  Keep your knee raised (elevated) when you are sitting or lying down.  Take medicines only as directed by your health care provider.  Do any rehabilitation or strengthening exercises as directed by your health care provider.  Rest your knee as directed by your health care provider. You may start doing your normal activities again when your health care provider approves.  Keep all follow-up visits as directed by your health care provider. This is important. Contact a health care provider if:  You have ongoing (persistent) pain in your knee. Get help right away if:  You have increased swelling or redness of your knee.  You have severe pain in your knee.  You have a fever. This information is not intended to replace advice given to you by your health care provider. Make sure you discuss any questions you have with your health care provider. Document Released: 08/11/2003 Document Revised: 10/27/2015 Document Reviewed: 01/04/2014 Elsevier Interactive Patient Education  2018 [employer].     IF you received an x-ray today, you will receive an invoice from [facility].  Please contact [facility] at [phone] with questions or concerns regarding your invoice.   IF you received labwork today, you will receive an invoice from [facility]. Please contact [facility] at [phone] with questions or concerns regarding your invoice.   Our billing staff will not be able to assist you with questions regarding bills from these companies.  You will be contacted with the lab results as soon as they are available. The fastest way to get your results is to activate your My Chart account. Instructions are located on the last page of this paperwork. If you have not heard from [place] regarding the results in 2 weeks, please contact this office.

## 2017-03-30 LAB — CBC
Hematocrit: 37.7 % (ref 34.0–46.6)
Hemoglobin: 12.3 g/dL (ref 11.1–15.9)
MCH: 26.6 pg (ref 26.6–33.0)
MCHC: 32.6 g/dL (ref 31.5–35.7)
MCV: 82 fL (ref 79–97)
Platelets: 276 10*3/uL (ref 150–379)
RBC: 4.62 x10E6/uL (ref 3.77–5.28)
RDW: 14.7 % (ref 12.3–15.4)
WBC: 6.8 10*3/uL (ref 3.4–10.8)

## 2017-03-30 LAB — URIC ACID: URIC ACID: 4.8 mg/dL (ref 2.5–7.1)

## 2017-03-30 LAB — SEDIMENTATION RATE: SED RATE: 31 mm/h (ref 0–40)

## 2017-04-03 ENCOUNTER — Telehealth: Payer: Self-pay | Admitting: Physician Assistant

## 2017-04-03 NOTE — Telephone Encounter (Signed)
Pt. Called asking if she could get a note from her TB skin test on 03/29/2017 saying that she had it done and she passed.   Best number [phone]  Pt. Also requested it be sent to email if POSSIBLE [email or web address]

## 2017-04-05 NOTE — Telephone Encounter (Signed)
LVM for patient advising that TB test does not show any results. Unable to print for her at this time.

## 2017-04-08 LAB — QUANTIFERON-TB GOLD PLUS
QuantiFERON Mitogen Value: 5.34 IU/mL
QuantiFERON Nil Value: 0.12 IU/mL
QuantiFERON TB1 Ag Value: 0.25 IU/mL
QuantiFERON TB2 Ag Value: 0.17 IU/mL
QuantiFERON-TB Gold Plus: NEGATIVE

## 2017-04-22 ENCOUNTER — Other Ambulatory Visit: Payer: Self-pay | Admitting: Physician Assistant

## 2017-04-22 DIAGNOSIS — Z1231 Encounter for screening mammogram for malignant neoplasm of breast: Secondary | ICD-10-CM

## 2017-05-21 ENCOUNTER — Ambulatory Visit
Admission: RE | Admit: 2017-05-21 | Discharge: 2017-05-21 | Disposition: A | Discharge status: Home / Self Care | Payer: BC Managed Care – PPO | Source: Ambulatory Visit | Attending: Physician Assistant | Admitting: Physician Assistant

## 2017-05-21 DIAGNOSIS — Z1231 Encounter for screening mammogram for malignant neoplasm of breast: Secondary | ICD-10-CM

## 2017-05-27 ENCOUNTER — Other Ambulatory Visit: Payer: Self-pay | Admitting: Physician Assistant

## 2017-05-27 DIAGNOSIS — I10 Essential (primary) hypertension: Secondary | ICD-10-CM

## 2017-06-02 ENCOUNTER — Telehealth: Payer: Self-pay | Admitting: Physician Assistant

## 2017-06-03 MED ORDER — LEVOTHYROXINE SODIUM 100 MCG PO TABS: 100.0000 ug | ORAL_TABLET | Freq: Every day | ORAL | 0 refills | Status: DC

## 2017-06-03 NOTE — Telephone Encounter (Signed)
Copied from [place] [phone]. Topic: Quick Communication - Rx Refill/Question >> Jun 03, 2017 11:33 AM Scherrie Gerlach wrote: Has the patient contacted their pharmacy? Yes, pt states last week. But we did not receive Pt request refill  levothyroxine (SYNTHROID, LEVOTHROID) 100 MCG tablet [facility] [address] [place] [phone] (Phone) [phone] (Fax)

## 2017-06-18 ENCOUNTER — Ambulatory Visit (INDEPENDENT_AMBULATORY_CARE_PROVIDER_SITE_OTHER): Payer: BC Managed Care – PPO | Admitting: Endocrinology

## 2017-06-18 ENCOUNTER — Encounter: Payer: Self-pay | Admitting: Endocrinology

## 2017-06-18 VITALS — BP 128/84 | HR 96 | Ht 62.0 in | Wt 199.0 lb

## 2017-06-18 DIAGNOSIS — E890 Postprocedural hypothyroidism: Secondary | ICD-10-CM

## 2017-06-18 LAB — T4, FREE: Free T4: 0.94 ng/dL (ref 0.60–1.60)

## 2017-06-18 LAB — TSH: TSH: 1.3 u[IU]/mL (ref 0.35–4.50)

## 2017-06-19 ENCOUNTER — Encounter: Payer: Self-pay | Admitting: Endocrinology

## 2017-06-19 NOTE — Progress Notes (Signed)
Patient ID: Marilyn [name], female   DOB: 1965-07-20, 52 y.o.   MRN: [id number]             Reason for Appointment:  Hypothyroidism, new visit    History of Present Illness:   Referring Physician: Colletta Maryland English  Hypothyroidism was first diagnosed  after her thyroid surgery in 1977   Prior to her thyroid surgery she apparently had a very large goiter at the age of 67 and probably uncontrolled hyperthyroidism No details are known and she is not sure about the previous diagnosis also however thyroid surgery was recommended by an endocrinologist  Sequelae the patient has been on thyroid supplementation and followed by various physicians  She thinks that she had been on the medication with 125 g of levothyroxine for several years without any change in dosage When she was establishing with a new provider in August and her TSH was low and she was also having some problems with palpitations and chest discomfort After retesting in September 2018 her prescription was changed to 100 g levothyroxine and her palpitations improved  She is currently not complaining of any unusual fatigue but is having various other symptoms such as dry mouth, some difficulty swallowing, dizziness, abdominal discomfort and cramping She does also have some symptoms of cold intolerance and dry skin He does not complain of any bowel changes or swelling of her hands No significant weight change recently  He has not had any follow-up labs since her dosage change She is very regular with taking her levothyroxine at least an hour before breakfast and takes this with her blood pressure medicine but no vitamins         Patient's weight history is as follows:  Wt Readings from Last 3 Encounters:  06/18/17 199 lb (90.3 kg)  03/29/17 196 lb 6.4 oz (89.1 kg)  03/25/17 196 lb (88.9 kg)    Thyroid function results have been as follows:  Lab Results  Component Value Date   TSH 1.30 06/18/2017   TSH 0.174 (L)  02/20/2017   TSH 0.195 (L) 01/25/2017   TSH 0.039 (L) 01/24/2017   FREET4 0.94 06/18/2017   T3FREE 2.1 07/18/2015     Past Medical History:  Diagnosis Date  . Allergy   . 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/2007  . ASTHMA 01/30/2007  . Asthma   . Breast tumor    right; benign  . Chest pain 01/24/2017  . GERD 01/31/2007  . Hemorrhoids   . Hiatal hernia   . HTN (hypertension) 09/09/2011  . Hyperlipidemia   . HYPOTHYROIDISM 01/30/2007  . Mallory Mariel Kansky tear 04/2003    Past Surgical History:  Procedure Laterality Date  . BREAST LUMPECTOMY Right    benign  . BREAST LUMPECTOMY WITH RADIOACTIVE SEED LOCALIZATION Right 02/25/2015   Procedure: BREAST LUMPECTOMY WITH RADIOACTIVE SEED LOCALIZATION;  Surgeon: Alphonsa Overall, MD;  Location: [place];  Service: General;  Laterality: Right;  . BREAST REDUCTION SURGERY  1986  . BREAST SURGERY    . broken jaw    . CARPAL TUNNEL RELEASE  2001  . FRACTURE SURGERY    . REDUCTION MAMMAPLASTY Bilateral   . THYROIDECTOMY    . TUBAL LIGATION  1987    Family History  Problem Relation Age of Onset  . Hypertension Father   . Diabetes Father   . Hyperlipidemia Father   . Asthma Mother   . Breast cancer Sister   . Thyroid disease Sister   . Diabetes Maternal Grandmother   .  Heart disease Maternal Grandmother   . Heart attack Maternal Grandmother 60  . Asthma Maternal Grandfather   . Heart attack Maternal Grandfather   . Colon cancer Neg Hx     Social History:  reports that she quit smoking about 37 years ago. Her smoking use included cigarettes. She has a 0.75 pack-year smoking history. she has never used smokeless tobacco. She reports that she does not drink alcohol or use drugs.  Allergies:  Allergies  Allergen Reactions  . Tuna [Fish Allergy] Anaphylaxis  . Codeine Nausea And Vomiting  . Flonase [Fluticasone Propionate] Other (See Comments)    Nose bleeds    Allergies as of 06/18/2017      Reactions   Tuna [fish Allergy]  Anaphylaxis   Codeine Nausea And Vomiting   Flonase [fluticasone Propionate] Other (See Comments)   Nose bleeds      Medication List        Accurate as of 06/18/17 11:59 PM. Always use your most recent med list.          albuterol 108 (90 Base) MCG/ACT inhaler Commonly known as:  PROVENTIL HFA;VENTOLIN HFA Inhale 2 puffs into the lungs every 4 (four) hours as needed for wheezing or shortness of breath (cough, shortness of breath or wheezing.).   amLODipine 10 MG tablet Commonly known as:  NORVASC TAKE ONE TABLET BY MOUTH DAILY   atorvastatin 40 MG tablet Commonly known as:  LIPITOR Take 1 tablet (40 mg total) by mouth daily.   azithromycin 250 MG tablet Commonly known as:  ZITHROMAX Take 2 tabs PO x 1 dose, then 1 tab PO QD x 4 days   benzonatate 100 MG capsule Commonly known as:  TESSALON Take 1-2 capsules (100-200 mg total) by mouth 3 (three) times daily as needed for cough.   EPIPEN 2-PAK 0.3 mg/0.3 mL Soaj injection Generic drug:  EPINEPHrine Inject 0.3 mg as directed as needed for anaphylaxis.   famotidine 20 MG tablet Commonly known as:  PEPCID Take 1 tablet (20 mg total) by mouth 2 (two) times daily.   Guaifenesin 1200 MG Tb12 Commonly known as:  MUCINEX MAXIMUM STRENGTH Take 1 tablet (1,200 mg total) by mouth every 12 (twelve) hours as needed.   hydrochlorothiazide 12.5 MG capsule Commonly known as:  MICROZIDE Take 1 capsule (12.5 mg total) by mouth daily. As needed for swelling   lansoprazole 30 MG capsule Commonly known as:  PREVACID Take 1 capsule (30 mg total) by mouth 2 (two) times daily before a meal.   levothyroxine 100 MCG tablet Commonly known as:  SYNTHROID, LEVOTHROID Take 1 tablet (100 mcg total) by mouth daily.   naproxen sodium 550 MG tablet Commonly known as:  ANAPROX Take 1 tablet (550 mg total) by mouth 2 (two) times daily with a meal.   pantoprazole 40 MG tablet Commonly known as:  PROTONIX Take 1 tablet (40 mg total) by mouth 2  (two) times daily.   polyvinyl alcohol 1.4 % ophthalmic solution Commonly known as:  LIQUIFILM TEARS Place 1 drop into both eyes at bedtime.          Review of Systems  Constitutional: Negative for weight loss.  HENT: Positive for trouble swallowing. Negative for hoarseness.   Respiratory: Negative for shortness of breath.   Cardiovascular: Negative for palpitations and leg swelling.  Gastrointestinal: Positive for abdominal pain. Negative for constipation and diarrhea.  Endocrine: Positive for cold intolerance. Negative for fatigue.  Musculoskeletal: Negative for joint pain.  Skin:  Only has a couple of pigmented spots on the lower leg  Neurological: Negative for weakness and tremors.  Psychiatric/Behavioral: Negative for insomnia.                Examination:    BP 128/84   Pulse 96   Ht 5\' 2"  (1.575 m)   Wt 199 lb (90.3 kg)   LMP 03/18/2014   SpO2 99%   BMI 36.40 kg/m   GENERAL:  She has generalized obesity, no cushingoid features  No pallor, clubbing, cervical lymphadenopathy or edema.    Skin:  no rash or pigmentation.  EYES:  No prominence of the eyes or swelling of the eyelids  ENT: Oral mucosa and tongue normal.  THYROID:  Not palpable.  Large scar of thyroidectomy present No stridor  HEART:  Normal  S1 and S2; no murmur or click.  CHEST:    Lungs: Vescicular breath sounds heard equally.  No crepitations/ wheeze.  ABDOMEN:  No distention.  Liver and spleen not palpable.   No other mass or tenderness.  NEUROLOGICAL: Reflexes are normal bilaterally at biceps.  Ankle reflexes difficult to elicit  JOINTS:  Normal.   Assessment:  HYPOTHYROIDISM, postsurgical for several years In 2018 she appears to have had excessive dose of levothyroxine causing her to have palpitations and low TSH She has had lower doses since 02/2017 but has had no follow-up labs  Currently she is not complaining of any unusual fatigue or weight gain but has other  nonspecific symptoms which are likely to be unrelated to her hypothyroidism or history of thyroid surgery  PLAN:   Check labs and decide on her thyroid dose Follow-up to be decided   Elayne Snare 06/19/2017, 8:38 AM   Consultation note copy sent to the PCP  Note: This office note was prepared with Dragon voice recognition system technology. Any transcriptional errors that result from this process are unintentional.  Addendum: TSH 1.3, she will continue the same dosage and follow-up in 4 months

## 2017-07-05 ENCOUNTER — Encounter: Payer: Self-pay | Admitting: Emergency Medicine

## 2017-07-05 ENCOUNTER — Other Ambulatory Visit: Payer: Self-pay

## 2017-07-05 ENCOUNTER — Ambulatory Visit: Payer: BC Managed Care – PPO | Admitting: Emergency Medicine

## 2017-07-05 VITALS — BP 108/74 | HR 105 | Temp 98.1°F | Resp 16 | Ht 62.25 in | Wt 199.2 lb

## 2017-07-05 DIAGNOSIS — R1084 Generalized abdominal pain: Secondary | ICD-10-CM | POA: Diagnosis not present

## 2017-07-05 DIAGNOSIS — J069 Acute upper respiratory infection, unspecified: Secondary | ICD-10-CM

## 2017-07-05 DIAGNOSIS — J0100 Acute maxillary sinusitis, unspecified: Secondary | ICD-10-CM | POA: Diagnosis not present

## 2017-07-05 MED ORDER — HYOSCYAMINE SULFATE SL 0.125 MG SL SUBL: 1.0000 | SUBLINGUAL_TABLET | Freq: Four times a day (QID) | SUBLINGUAL | 3 refills | Status: DC | PRN

## 2017-07-05 MED ORDER — AMOXICILLIN-POT CLAVULANATE 875-125 MG PO TABS: 1.0000 | ORAL_TABLET | Freq: Two times a day (BID) | ORAL | 0 refills | Status: AC

## 2017-07-05 MED ORDER — PROMETHAZINE-DM 6.25-15 MG/5ML PO SYRP: 5.0000 mL | ORAL_SOLUTION | Freq: Four times a day (QID) | ORAL | 0 refills | Status: DC | PRN

## 2017-07-05 NOTE — Progress Notes (Signed)
Marilyn [name] 52 y.o.   Chief Complaint  Patient presents with  . Cough    productive yellow mucus  . Nasal Congestion    blowing yellow mucus  . GI Problem    sometimes pain or movement x 4 weeks    HISTORY OF PRESENT ILLNESS: This is a 52 y.o. female complaining of one-week history of URI symptoms.  Also complaining of 4-week history of intermittent diffuse sharp abdominal pain.  Cramping pain lasting 1-3 minutes.  Not associated with any other significant symptomatology.  URI   This is a new problem. The current episode started in the past 7 days. The problem has been gradually worsening. There has been no fever. Associated symptoms include abdominal pain, congestion, coughing, headaches, rhinorrhea, sinus pain and a sore throat. Pertinent negatives include no chest pain, diarrhea, dysuria, ear pain, nausea, neck pain, rash, vomiting or wheezing. She has tried nothing for the symptoms.     Prior to Admission medications   Medication Sig Start Date End Date Taking? Authorizing Provider  albuterol (PROVENTIL HFA;VENTOLIN HFA) 108 (90 Base) MCG/ACT inhaler Inhale 2 puffs into the lungs every 4 (four) hours as needed for wheezing or shortness of breath (cough, shortness of breath or wheezing.). 06/26/15  Yes English, Colletta Maryland D, PA  amLODipine (NORVASC) 10 MG tablet TAKE ONE TABLET BY MOUTH DAILY 05/27/17  Yes English, Stephanie D, PA  atorvastatin (LIPITOR) 40 MG tablet Take 1 tablet (40 mg total) by mouth daily. 08/27/16  Yes English, Stephanie D, PA  EPIPEN 2-PAK 0.3 MG/0.3ML SOAJ injection Inject 0.3 mg as directed as needed for anaphylaxis. 11/21/16  Yes [provider]  levothyroxine (SYNTHROID, LEVOTHROID) 100 MCG tablet Take 1 tablet (100 mcg total) by mouth daily. 06/03/17  Yes Ivar Drape D, PA  naproxen sodium (ANAPROX) 550 MG tablet Take 1 tablet (550 mg total) by mouth 2 (two) times daily with a meal. 03/29/17  Yes Jaynee Eagles, PA-C  pantoprazole (PROTONIX)  40 MG tablet Take 1 tablet (40 mg total) by mouth 2 (two) times daily. 01/29/17  Yes Zehr, Laban Emperor, PA-C  polyvinyl alcohol (LIQUIFILM TEARS) 1.4 % ophthalmic solution Place 1 drop into both eyes at bedtime. 07/21/15  Yes Nita Sells, MD  azithromycin (ZITHROMAX) 250 MG tablet Take 2 tabs PO x 1 dose, then 1 tab PO QD x 4 days Patient not taking: Reported on 06/18/2017 03/25/17   Ivar Drape D, PA  benzonatate (TESSALON) 100 MG capsule Take 1-2 capsules (100-200 mg total) by mouth 3 (three) times daily as needed for cough. Patient not taking: Reported on 06/18/2017 03/25/17   Ivar Drape D, PA  famotidine (PEPCID) 20 MG tablet Take 1 tablet (20 mg total) by mouth 2 (two) times daily. Patient not taking: Reported on 07/05/2017 02/10/17   Frederica Kuster, PA-C  Guaifenesin [facility] MAXIMUM STRENGTH) 1200 MG TB12 Take 1 tablet (1,200 mg total) by mouth every 12 (twelve) hours as needed. Patient not taking: Reported on 06/18/2017 03/25/17   Ivar Drape D, PA  hydrochlorothiazide (MICROZIDE) 12.5 MG capsule Take 1 capsule (12.5 mg total) by mouth daily. As needed for swelling Patient not taking: Reported on 07/05/2017 04/07/14   Biagio Borg, MD  lansoprazole (PREVACID) 30 MG capsule Take 1 capsule (30 mg total) by mouth 2 (two) times daily before a meal. Patient not taking: Reported on 07/05/2017 09/06/16   Ivar Drape D, PA    Allergies  Allergen Reactions  . Tuna [Fish Allergy] Anaphylaxis  . Codeine  Nausea And Vomiting  . Flonase [Fluticasone Propionate] Other (See Comments)    Nose bleeds    Patient Active Problem List   Diagnosis Date Noted  . Atypical chest pain 01/23/2017  . Nausea & vomiting 07/16/2015  . RUQ abdominal pain 07/16/2015  . RLQ abdominal pain 07/15/2015  . Pain in lower back 11/30/2014  . Acute upper respiratory infection 05/05/2014  . Allergic rhinitis 05/05/2014  . Hoarseness 05/05/2014  . Dysphagia 05/05/2014  . Obesity 04/11/2014  .  Edema, peripheral 04/11/2014  . Cough 12/20/2012  . Hypersomnolence 12/15/2012  . DOE (dyspnea on exertion) 12/15/2012  . Right shoulder pain 12/15/2012  . Abdominal pain 11/15/2011  . HTN (hypertension) 09/09/2011  . Neck pain 09/09/2011  . Preventative health care 04/30/2011  . HOT FLASHES 12/22/2009  . CHEST PAIN 01/29/2009  . HYPERTHYROIDISM 01/31/2007  . GERD 01/31/2007  . Hypothyroidism 01/30/2007  . Hyperlipidemia 01/30/2007  . Iron deficiency anemia 01/30/2007  . ASTHMA 01/30/2007    Past Medical History:  Diagnosis Date  . Allergy   . 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/2007  . ASTHMA 01/30/2007  . Asthma   . Breast tumor    right; benign  . Chest pain 01/24/2017  . GERD 01/31/2007  . Hemorrhoids   . Hiatal hernia   . HTN (hypertension) 09/09/2011  . Hyperlipidemia   . HYPOTHYROIDISM 01/30/2007  . Mallory Mariel Kansky tear 04/2003    Past Surgical History:  Procedure Laterality Date  . BREAST LUMPECTOMY Right    benign  . BREAST LUMPECTOMY WITH RADIOACTIVE SEED LOCALIZATION Right 02/25/2015   Procedure: BREAST LUMPECTOMY WITH RADIOACTIVE SEED LOCALIZATION;  Surgeon: Alphonsa Overall, MD;  Location: [place];  Service: General;  Laterality: Right;  . BREAST REDUCTION SURGERY  1986  . BREAST SURGERY    . broken jaw    . CARPAL TUNNEL RELEASE  2001  . FRACTURE SURGERY    . REDUCTION MAMMAPLASTY Bilateral   . THYROIDECTOMY    . TUBAL LIGATION  1987    Social History   Socioeconomic History  . Marital status: Single    Spouse name: Not on file  . Number of children: 3  . Years of education: Not on file  . Highest education level: Not on file  Social Needs  . Financial resource strain: Not on file  . Food insecurity - worry: Not on file  . Food insecurity - inability: Not on file  . Transportation needs - medical: Not on file  . Transportation needs - non-medical: Not on file  Occupational History  . 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[employer]    Employer: [employer]  Tobacco Use  . Smoking status: Former Smoker    Packs/day: 0.25    Years: 3.00    Pack years: 0.75    Types: Cigarettes    Last attempt to quit: 06/04/1980    Years since quitting: 37.1  . Smokeless tobacco: Never Used  Substance and Sexual Activity  . Alcohol use: No    Alcohol/week: 0.0 oz  . Drug use: No  . Sexual activity: Not on file  Other Topics Concern  . Not on file  Social History Narrative   Married    Family History  Problem Relation Age of Onset  . Hypertension Father   . Diabetes Father   . Hyperlipidemia Father   . Asthma Mother   . Breast cancer Sister   . Thyroid disease Sister   . Diabetes Maternal Grandmother   . Heart disease Maternal  Grandmother   . Heart attack Maternal Grandmother 60  . Asthma Maternal Grandfather   . Heart attack Maternal Grandfather   . Colon cancer Neg Hx      Review of Systems  Constitutional: Positive for chills and malaise/fatigue. Negative for fever.  HENT: Positive for congestion, rhinorrhea, sinus pain and sore throat. Negative for ear pain.   Eyes: Negative.   Respiratory: Positive for cough. Negative for wheezing.   Cardiovascular: Negative for chest pain and palpitations.  Gastrointestinal: Positive for abdominal pain and constipation. Negative for blood in stool, diarrhea, melena, nausea and vomiting.  Genitourinary: Negative for dysuria and hematuria.  Musculoskeletal: Negative for neck pain.  Skin: Negative for rash.  Neurological: Positive for headaches.  Endo/Heme/Allergies: Negative.   All other systems reviewed and are negative.   Vitals:   07/05/17 1650  BP: 108/74  Pulse: (!) 105  Resp: 16  Temp: 98.1 F (36.7 C)  SpO2: 99%    Physical Exam  Constitutional: She is oriented to person, place, and time. She appears well-developed and well-nourished.  HENT:  Head: Normocephalic and atraumatic.  Right Ear: Hearing, tympanic membrane, external ear and ear canal normal.  Left Ear: Hearing,  tympanic membrane, external ear and ear canal normal.  Eyes: Conjunctivae and EOM are normal. Pupils are equal, round, and reactive to light.  Neck: Normal range of motion. Neck supple. No JVD present. No thyromegaly present.  Cardiovascular: Normal rate, regular rhythm and normal heart sounds.  Pulmonary/Chest: Effort normal and breath sounds normal.  Abdominal: Soft. She exhibits no distension. There is no tenderness.  Musculoskeletal: Normal range of motion.  Lymphadenopathy:    She has no cervical adenopathy.  Neurological: She is alert and oriented to person, place, and time. No sensory deficit. She exhibits normal muscle tone.  Skin: Skin is warm and dry. Capillary refill takes less than 2 seconds. No rash noted.  Psychiatric: She has a normal mood and affect. Her behavior is normal.  Vitals reviewed.    ASSESSMENT & PLAN: Marilyn [name] was seen today for cough, nasal congestion and gi problem.  Diagnoses and all orders for this visit:  Acute upper respiratory infection  Acute non-recurrent maxillary sinusitis -     amoxicillin-clavulanate (AUGMENTIN) 875-125 MG tablet; Take 1 tablet by mouth 2 (two) times daily for 7 days.  Generalized abdominal pain -     Hyoscyamine Sulfate SL (LEVSIN/SL) 0.125 MG SUBL; Place 1 tablet under the tongue every 6 (six) hours as needed.  Other orders -     promethazine-dextromethorphan (PROMETHAZINE-DM) 6.25-15 MG/5ML syrup; Take 5 mLs by mouth 4 (four) times daily as needed for cough.    Patient Instructions   Abdominal Pain, Adult Many things can cause belly (abdominal) pain. Most times, belly pain is not dangerous. Many cases of belly pain can be watched and treated at home. Sometimes belly pain is serious, though. Your doctor will try to find the cause of your belly pain. Follow these instructions at home:  Take over-the-counter and prescription medicines only as told by your doctor. Do not take medicines that help you poop (laxatives) unless  told to by your doctor.  Drink enough fluid to keep your pee (urine) clear or pale yellow.  Watch your belly pain for any changes.  Keep all follow-up visits as told by your doctor. This is important. Contact a doctor if:  Your belly pain changes or gets worse.  You are not hungry, or you lose weight without trying.  You are having  trouble pooping (constipated) or have watery poop (diarrhea) for more than 2-3 days.  You have pain when you pee or poop.  Your belly pain wakes you up at night.  Your pain gets worse with meals, after eating, or with certain foods.  You are throwing up and cannot keep anything down.  You have a fever. Get help right away if:  Your pain does not go away as soon as your doctor says it should.  You cannot stop throwing up.  Your pain is only in areas of your belly, such as the right side or the left lower part of the belly.  You have bloody or black poop, or poop that looks like tar.  You have very bad pain, cramping, or bloating in your belly.  You have signs of not having enough fluid or water in your body (dehydration), such as: ? Dark pee, very little pee, or no pee. ? Cracked lips. ? Dry mouth. ? Sunken eyes. ? Sleepiness. ? Weakness. This information is not intended to replace advice given to you by your health care provider. Make sure you discuss any questions you have with your health care provider. Document Released: 11/07/2007 Document Revised: 12/09/2015 Document Reviewed: 11/02/2015 Elsevier Interactive Patient Education  2018 [employer].     IF you received an x-ray today, you will receive an invoice from [facility]. Please contact [facility] at [phone] with questions or concerns regarding your invoice.   IF you received labwork today, you will receive an invoice from [facility]. Please contact [facility] at [phone] with questions or concerns regarding your invoice.   Our billing staff will not  be able to assist you with questions regarding bills from these companies.  You will be contacted with the lab results as soon as they are available. The fastest way to get your results is to activate your My Chart account. Instructions are located on the last page of this paperwork. If you have not heard from [place] regarding the results in 2 weeks, please contact this office.     Sinusitis, Adult Sinusitis is soreness and inflammation of your sinuses. Sinuses are hollow spaces in the bones around your face. They are located:  Around your eyes.  In the middle of your forehead.  Behind your nose.  In your cheekbones.  Your sinuses and nasal passages are lined with a stringy fluid (mucus). Mucus normally drains out of your sinuses. When your nasal tissues get inflamed or swollen, the mucus can get trapped or blocked so air cannot flow through your sinuses. This lets bacteria, viruses, and funguses grow, and that leads to infection. Follow these instructions at home: Medicines  Take, use, or apply over-the-counter and prescription medicines only as told by your doctor. These may include nasal sprays.  If you were prescribed an antibiotic medicine, take it as told by your doctor. Do not stop taking the antibiotic even if you start to feel better. Hydrate and Humidify  Drink enough water to keep your pee (urine) clear or pale yellow.  Use a cool mist humidifier to keep the humidity level in your home above 50%.  Breathe in steam for 10-15 minutes, 3-4 times a day or as told by your doctor. You can do this in the bathroom while a hot shower is running.  Try not to spend time in cool or dry air. Rest  Rest as much as possible.  Sleep with your head raised (elevated).  Make sure to get enough sleep each  night. General instructions  Put a warm, moist washcloth on your face 3-4 times a day or as told by your doctor. This will help with discomfort.  Wash your hands often with soap and  water. If there is no soap and water, use hand sanitizer.  Do not smoke. Avoid being around people who are smoking (secondhand smoke).  Keep all follow-up visits as told by your doctor. This is important. Contact a doctor if:  You have a fever.  Your symptoms get worse.  Your symptoms do not get better within 10 days. Get help right away if:  You have a very bad headache.  You cannot stop throwing up (vomiting).  You have pain or swelling around your face or eyes.  You have trouble seeing.  You feel confused.  Your neck is stiff.  You have trouble breathing. This information is not intended to replace advice given to you by your health care provider. Make sure you discuss any questions you have with your health care provider. Document Released: 11/07/2007 Document Revised: 01/15/2016 Document Reviewed: 03/16/2015 Elsevier Interactive Patient Education  2018 [employer] Inc[employer]      Agustina Caroli, MD [facility]

## 2017-07-05 NOTE — Patient Instructions (Addendum)
Abdominal Pain, Adult Many things can cause belly (abdominal) pain. Most times, belly pain is not dangerous. Many cases of belly pain can be watched and treated at home. Sometimes belly pain is serious, though. Your doctor will try to find the cause of your belly pain. Follow these instructions at home:  Take over-the-counter and prescription medicines only as told by your doctor. Do not take medicines that help you poop (laxatives) unless told to by your doctor.  Drink enough fluid to keep your pee (urine) clear or pale yellow.  Watch your belly pain for any changes.  Keep all follow-up visits as told by your doctor. This is important. Contact a doctor if:  Your belly pain changes or gets worse.  You are not hungry, or you lose weight without trying.  You are having trouble pooping (constipated) or have watery poop (diarrhea) for more than 2-3 days.  You have pain when you pee or poop.  Your belly pain wakes you up at night.  Your pain gets worse with meals, after eating, or with certain foods.  You are throwing up and cannot keep anything down.  You have a fever. Get help right away if:  Your pain does not go away as soon as your doctor says it should.  You cannot stop throwing up.  Your pain is only in areas of your belly, such as the right side or the left lower part of the belly.  You have bloody or black poop, or poop that looks like tar.  You have very bad pain, cramping, or bloating in your belly.  You have signs of not having enough fluid or water in your body (dehydration), such as: ? Dark pee, very little pee, or no pee. ? Cracked lips. ? Dry mouth. ? Sunken eyes. ? Sleepiness. ? Weakness. This information is not intended to replace advice given to you by your health care provider. Make sure you discuss any questions you have with your health care provider. Document Released: 11/07/2007 Document Revised: 12/09/2015 Document Reviewed: 11/02/2015 Elsevier  Interactive Patient Education  2018 [employer].     IF you received an x-ray today, you will receive an invoice from [facility]. Please contact [facility] at [phone] with questions or concerns regarding your invoice.   IF you received labwork today, you will receive an invoice from [facility]. Please contact [facility] at [phone] with questions or concerns regarding your invoice.   Our billing staff will not be able to assist you with questions regarding bills from these companies.  You will be contacted with the lab results as soon as they are available. The fastest way to get your results is to activate your My Chart account. Instructions are located on the last page of this paperwork. If you have not heard from [place] regarding the results in 2 weeks, please contact this office.     Sinusitis, Adult Sinusitis is soreness and inflammation of your sinuses. Sinuses are hollow spaces in the bones around your face. They are located:  Around your eyes.  In the middle of your forehead.  Behind your nose.  In your cheekbones.  Your sinuses and nasal passages are lined with a stringy fluid (mucus). Mucus normally drains out of your sinuses. When your nasal tissues get inflamed or swollen, the mucus can get trapped or blocked so air cannot flow through your sinuses. This lets bacteria, viruses, and funguses grow, and that leads to infection. Follow these instructions at home: Medicines  Take, use,  or apply over-the-counter and prescription medicines only as told by your doctor. These may include nasal sprays.  If you were prescribed an antibiotic medicine, take it as told by your doctor. Do not stop taking the antibiotic even if you start to feel better. Hydrate and Humidify  Drink enough water to keep your pee (urine) clear or pale yellow.  Use a cool mist humidifier to keep the humidity level in your home above 50%.  Breathe in steam for 10-15 minutes, 3-4  times a day or as told by your doctor. You can do this in the bathroom while a hot shower is running.  Try not to spend time in cool or dry air. Rest  Rest as much as possible.  Sleep with your head raised (elevated).  Make sure to get enough sleep each night. General instructions  Put a warm, moist washcloth on your face 3-4 times a day or as told by your doctor. This will help with discomfort.  Wash your hands often with soap and water. If there is no soap and water, use hand sanitizer.  Do not smoke. Avoid being around people who are smoking (secondhand smoke).  Keep all follow-up visits as told by your doctor. This is important. Contact a doctor if:  You have a fever.  Your symptoms get worse.  Your symptoms do not get better within 10 days. Get help right away if:  You have a very bad headache.  You cannot stop throwing up (vomiting).  You have pain or swelling around your face or eyes.  You have trouble seeing.  You feel confused.  Your neck is stiff.  You have trouble breathing. This information is not intended to replace advice given to you by your health care provider. Make sure you discuss any questions you have with your health care provider. Document Released: 11/07/2007 Document Revised: 01/15/2016 Document Reviewed: 03/16/2015 Elsevier Interactive Patient Education  [employer].

## 2017-07-09 ENCOUNTER — Other Ambulatory Visit: Payer: Self-pay

## 2017-07-09 ENCOUNTER — Encounter (HOSPITAL_COMMUNITY): Payer: Self-pay

## 2017-07-09 ENCOUNTER — Emergency Department (HOSPITAL_COMMUNITY)
Admission: EM | Admit: 2017-07-09 | Discharge: 2017-07-10 | Disposition: A | Discharge status: Home / Self Care | Payer: BC Managed Care – PPO | Attending: Emergency Medicine | Admitting: Emergency Medicine

## 2017-07-09 DIAGNOSIS — R1032 Left lower quadrant pain: Secondary | ICD-10-CM | POA: Insufficient documentation

## 2017-07-09 DIAGNOSIS — I10 Essential (primary) hypertension: Secondary | ICD-10-CM | POA: Insufficient documentation

## 2017-07-09 DIAGNOSIS — L089 Local infection of the skin and subcutaneous tissue, unspecified: Secondary | ICD-10-CM | POA: Diagnosis not present

## 2017-07-09 DIAGNOSIS — R339 Retention of urine, unspecified: Secondary | ICD-10-CM | POA: Insufficient documentation

## 2017-07-09 DIAGNOSIS — J45909 Unspecified asthma, uncomplicated: Secondary | ICD-10-CM | POA: Insufficient documentation

## 2017-07-09 DIAGNOSIS — Z79899 Other long term (current) drug therapy: Secondary | ICD-10-CM | POA: Insufficient documentation

## 2017-07-09 DIAGNOSIS — R338 Other retention of urine: Secondary | ICD-10-CM

## 2017-07-09 DIAGNOSIS — E039 Hypothyroidism, unspecified: Secondary | ICD-10-CM | POA: Insufficient documentation

## 2017-07-09 DIAGNOSIS — Z87891 Personal history of nicotine dependence: Secondary | ICD-10-CM | POA: Diagnosis not present

## 2017-07-09 LAB — I-STAT CHEM 8, ED
BUN: 14 mg/dL (ref 6–20)
CALCIUM ION: 1.1 mmol/L — AB (ref 1.15–1.40)
CHLORIDE: 102 mmol/L (ref 101–111)
CREATININE: 1 mg/dL (ref 0.44–1.00)
Glucose, Bld: 82 mg/dL (ref 65–99)
HCT: 37 % (ref 36.0–46.0)
Hemoglobin: 12.6 g/dL (ref 12.0–15.0)
Potassium: 3.7 mmol/L (ref 3.5–5.1)
Sodium: 141 mmol/L (ref 135–145)
TCO2: 29 mmol/L (ref 22–32)

## 2017-07-09 LAB — URINALYSIS, ROUTINE W REFLEX MICROSCOPIC
BILIRUBIN URINE: NEGATIVE
Glucose, UA: NEGATIVE mg/dL
Hgb urine dipstick: NEGATIVE
KETONES UR: NEGATIVE mg/dL
Leukocytes, UA: NEGATIVE
NITRITE: NEGATIVE
PH: 6 (ref 5.0–8.0)
Protein, ur: NEGATIVE mg/dL
Specific Gravity, Urine: 1.013 (ref 1.005–1.030)

## 2017-07-09 LAB — I-STAT BETA HCG BLOOD, ED (MC, WL, AP ONLY): I-stat hCG, quantitative: <5 m[IU]/mL (ref <5)

## 2017-07-09 NOTE — ED Triage Notes (Signed)
Pt reports that she has been unable to urinate since 430p. A&Ox4. Bladder scan =420 mL

## 2017-07-10 ENCOUNTER — Emergency Department (HOSPITAL_COMMUNITY): Payer: BC Managed Care – PPO

## 2017-07-10 ENCOUNTER — Telehealth: Payer: Self-pay | Admitting: Physician Assistant

## 2017-07-10 ENCOUNTER — Telehealth: Payer: Self-pay

## 2017-07-10 ENCOUNTER — Emergency Department (HOSPITAL_COMMUNITY)
Admission: EM | Admit: 2017-07-10 | Discharge: 2017-07-10 | Discharge status: Absent without leave | Payer: BC Managed Care – PPO | Source: Home / Self Care

## 2017-07-10 ENCOUNTER — Encounter (HOSPITAL_COMMUNITY): Payer: Self-pay

## 2017-07-10 LAB — CBC
HEMATOCRIT: 35.5 % — AB (ref 36.0–46.0)
HEMOGLOBIN: 11.8 g/dL — AB (ref 12.0–15.0)
MCH: 27.4 pg (ref 26.0–34.0)
MCHC: 33.2 g/dL (ref 30.0–36.0)
MCV: 82.4 fL (ref 78.0–100.0)
Platelets: 211 10*3/uL (ref 150–400)
RBC: 4.31 MIL/uL (ref 3.87–5.11)
RDW: 13.4 % (ref 11.5–15.5)
WBC: 7.5 10*3/uL (ref 4.0–10.5)

## 2017-07-10 MED ORDER — SODIUM CHLORIDE 0.9 % IJ SOLN
INTRAMUSCULAR | Status: AC
  Administered 2017-07-10: 03:00:00
  Filled 2017-07-10: qty 50

## 2017-07-10 MED ORDER — DOXYCYCLINE HYCLATE 100 MG PO CAPS: 100.0000 mg | ORAL_CAPSULE | Freq: Two times a day (BID) | ORAL | 0 refills | Status: DC

## 2017-07-10 MED ORDER — IOPAMIDOL (ISOVUE-300) INJECTION 61%
100.0000 mL | Freq: Once | INTRAVENOUS | Status: AC | PRN
  Administered 2017-07-10: 100 mL via INTRAVENOUS

## 2017-07-10 MED ORDER — IOPAMIDOL (ISOVUE-300) INJECTION 61%
INTRAVENOUS | Status: AC
  Administered 2017-07-10: 100 mL via INTRAVENOUS
  Filled 2017-07-10: qty 100

## 2017-07-10 NOTE — Telephone Encounter (Signed)
Call pt no answer and unable to leave a vm due to mailbox being full.

## 2017-07-10 NOTE — Telephone Encounter (Signed)
More information to the nature of phone call

## 2017-07-10 NOTE — Telephone Encounter (Signed)
Copied from [place] [id number]. Topic: Inquiry >> Jul 10, 2017  9:38 AM Ahmed Prima L wrote: Patient states she was in the ER last night & only wants to talk to Dr Cleophus Molt about it. Call back [phone]

## 2017-07-10 NOTE — Telephone Encounter (Signed)
Patient called back and said it has to do with her catheter

## 2017-07-10 NOTE — Discharge Instructions (Signed)
Please see the urologist in 5-7 days for optimal evaluation.  The rash appears to be infection.  We have given you antibiotics. See your primary care doctor in 1 week for further care.  Return to the ER if your symptoms get worse.

## 2017-07-10 NOTE — ED Provider Notes (Signed)
[facility] DEPT Provider Note   CSN: [id number] Arrival date & time: 07/09/17  2120     History   Chief Complaint Chief Complaint  Patient presents with  . Urinary Retention    HPI Marilyn [name] is a 52 y.o. female.  HPI 52 year old female comes in with chief complaint of urinary retention. Patient has history of hiatal hernia, hypertension, hyperlipidemia.  Patient also has history of uterine fibroids.  She reports that she started noticing urinary retention this afternoon.  Patient has urge to go without any urine coming out.  Patient has been having pressure in the lower quadrants and pain in the left lower quadrant.  Patient has no history of diverticulosis.  She denies any new back pain, tingling in the perineal region, lower extremity weakness or numbness.  Patient does not have any history of cancer, or immunosuppression.  Patient also states that she has developed a new rash over the left gluteal fold.  Patient was started on Augmentin recently for URI.  Patient has started having diarrhea because of the medication, and when she wipes herself she has noticed some lesions in the gluteal fold which have increased in size.  Patient denies any history of HIV, hepatitis, or risk factors for them.  No history of skin disease.   Past Medical History:  Diagnosis Date  . Allergy   . 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/2007  . ASTHMA 01/30/2007  . Asthma   . Breast tumor    right; benign  . Chest pain 01/24/2017  . GERD 01/31/2007  . Hemorrhoids   . Hiatal hernia   . HTN (hypertension) 09/09/2011  . Hyperlipidemia   . HYPOTHYROIDISM 01/30/2007  . Mack Guise tear 04/2003    Patient Active Problem List   Diagnosis Date Noted  . Atypical chest pain 01/23/2017  . Nausea & vomiting 07/16/2015  . RUQ abdominal pain 07/16/2015  . RLQ abdominal pain 07/15/2015  . Pain in lower back 11/30/2014  . Acute upper respiratory infection 05/05/2014  .  Allergic rhinitis 05/05/2014  . Hoarseness 05/05/2014  . Dysphagia 05/05/2014  . Obesity 04/11/2014  . Edema, peripheral 04/11/2014  . Cough 12/20/2012  . Hypersomnolence 12/15/2012  . DOE (dyspnea on exertion) 12/15/2012  . Right shoulder pain 12/15/2012  . Abdominal pain 11/15/2011  . HTN (hypertension) 09/09/2011  . Neck pain 09/09/2011  . Preventative health care 04/30/2011  . HOT FLASHES 12/22/2009  . CHEST PAIN 01/29/2009  . Acute non-recurrent maxillary sinusitis 09/25/2008  . HYPERTHYROIDISM 01/31/2007  . GERD 01/31/2007  . Hypothyroidism 01/30/2007  . Hyperlipidemia 01/30/2007  . Iron deficiency anemia 01/30/2007  . ASTHMA 01/30/2007    Past Surgical History:  Procedure Laterality Date  . BREAST LUMPECTOMY Right    benign  . BREAST LUMPECTOMY WITH RADIOACTIVE SEED LOCALIZATION Right 02/25/2015   Procedure: BREAST LUMPECTOMY WITH RADIOACTIVE SEED LOCALIZATION;  Surgeon: Alphonsa Overall, MD;  Location: [facility];  Service: General;  Laterality: Right;  . BREAST REDUCTION SURGERY  1986  . BREAST SURGERY    . broken jaw    . CARPAL TUNNEL RELEASE  2001  . FRACTURE SURGERY    . REDUCTION MAMMAPLASTY Bilateral   . THYROIDECTOMY    . TUBAL LIGATION  1987    OB History    No data available       Home Medications    Prior to Admission medications   Medication Sig Start Date End Date Taking? Authorizing Provider  albuterol (PROVENTIL HFA;VENTOLIN HFA)  108 (90 Base) MCG/ACT inhaler Inhale 2 puffs into the lungs every 4 (four) hours as needed for wheezing or shortness of breath (cough, shortness of breath or wheezing.). 06/26/15  Yes English, Colletta Maryland D, PA  amLODipine (NORVASC) 10 MG tablet TAKE ONE TABLET BY MOUTH DAILY 05/27/17  Yes Ivar Drape D, PA  amoxicillin-clavulanate (AUGMENTIN) 875-125 MG tablet Take 1 tablet by mouth 2 (two) times daily for 7 days. 07/05/17 07/12/17 Yes Sagardia, Ines Bloomer, MD  atorvastatin (LIPITOR) 40 MG tablet Take 1  tablet (40 mg total) by mouth daily. 08/27/16  Yes English, Stephanie D, PA  EPIPEN 2-PAK 0.3 MG/0.3ML SOAJ injection Inject 0.3 mg as directed as needed for anaphylaxis. 11/21/16  Yes [provider]  Hyoscyamine Sulfate SL (LEVSIN/SL) 0.125 MG SUBL Place 1 tablet under the tongue every 6 (six) hours as needed. Patient taking differently: Place 1 tablet under the tongue every 6 (six) hours as needed (cramp).  07/05/17  Yes Sagardia, Ines Bloomer, MD  levothyroxine (SYNTHROID, LEVOTHROID) 100 MCG tablet Take 1 tablet (100 mcg total) by mouth daily. 06/03/17  Yes English, Colletta Maryland D, PA  pantoprazole (PROTONIX) 40 MG tablet Take 1 tablet (40 mg total) by mouth 2 (two) times daily. 01/29/17  Yes Zehr, Laban Emperor, PA-C  polyvinyl alcohol (LIQUIFILM TEARS) 1.4 % ophthalmic solution Place 1 drop into both eyes at bedtime. 07/21/15  Yes Nita Sells, MD  promethazine-dextromethorphan (PROMETHAZINE-DM) 6.25-15 MG/5ML syrup Take 5 mLs by mouth 4 (four) times daily as needed for cough. 07/05/17  Yes Sagardia, Ines Bloomer, MD  azithromycin (ZITHROMAX) 250 MG tablet Take 2 tabs PO x 1 dose, then 1 tab PO QD x 4 days Patient not taking: Reported on 06/18/2017 03/25/17   Ivar Drape D, PA  benzonatate (TESSALON) 100 MG capsule Take 1-2 capsules (100-200 mg total) by mouth 3 (three) times daily as needed for cough. Patient not taking: Reported on 06/18/2017 03/25/17   Ivar Drape D, PA  doxycycline (VIBRAMYCIN) 100 MG capsule Take 1 capsule (100 mg total) by mouth 2 (two) times daily. 07/10/17   Varney Biles, MD  famotidine (PEPCID) 20 MG tablet Take 1 tablet (20 mg total) by mouth 2 (two) times daily. Patient not taking: Reported on 07/05/2017 02/10/17   Frederica Kuster, PA-C  Guaifenesin [facility] MAXIMUM STRENGTH) 1200 MG TB12 Take 1 tablet (1,200 mg total) by mouth every 12 (twelve) hours as needed. Patient not taking: Reported on 06/18/2017 03/25/17   Ivar Drape D, PA    hydrochlorothiazide (MICROZIDE) 12.5 MG capsule Take 1 capsule (12.5 mg total) by mouth daily. As needed for swelling Patient not taking: Reported on 07/05/2017 04/07/14   Biagio Borg, MD  lansoprazole (PREVACID) 30 MG capsule Take 1 capsule (30 mg total) by mouth 2 (two) times daily before a meal. Patient not taking: Reported on 07/05/2017 09/06/16   Ivar Drape D, PA  naproxen sodium (ANAPROX) 550 MG tablet Take 1 tablet (550 mg total) by mouth 2 (two) times daily with a meal. Patient not taking: Reported on 07/09/2017 03/29/17   Jaynee Eagles, PA-C    Family History Family History  Problem Relation Age of Onset  . Hypertension Father   . Diabetes Father   . Hyperlipidemia Father   . Asthma Mother   . Breast cancer Sister   . Thyroid disease Sister   . Diabetes Maternal Grandmother   . Heart disease Maternal Grandmother   . Heart attack Maternal Grandmother 60  . Asthma Maternal Grandfather   .  Heart attack Maternal Grandfather   . Colon cancer Neg Hx     Social History Social History   Tobacco Use  . Smoking status: Former Smoker    Packs/day: 0.25    Years: 3.00    Pack years: 0.75    Types: Cigarettes    Last attempt to quit: 06/04/1980    Years since quitting: 37.1  . Smokeless tobacco: Never Used  Substance Use Topics  . Alcohol use: No    Alcohol/week: 0.0 oz  . Drug use: No     Allergies   Tuna [fish allergy]; Codeine; and Flonase [fluticasone propionate]   Review of Systems Review of Systems  All other systems reviewed and are negative.    Physical Exam Updated Vital Signs BP 114/81   Pulse 86   Temp 98.5 F (36.9 C) (Oral)   Resp 15   LMP 03/18/2014   SpO2 97%   Physical Exam  Constitutional: She is oriented to person, place, and time. She appears well-developed.  HENT:  Head: Normocephalic and atraumatic.  Eyes: EOM are normal.  Neck: Normal range of motion. Neck supple.  Cardiovascular: Normal rate.  Pulmonary/Chest: Effort normal.   Abdominal: Bowel sounds are normal. There is tenderness.  Left lower quadrant tenderness  Neurological: She is alert and oriented to person, place, and time.  Skin: Skin is warm and dry.  Nursing note and vitals reviewed.         ED Treatments / Results  Labs (all labs ordered are listed, but only abnormal results are displayed) Labs Reviewed  URINALYSIS, ROUTINE W REFLEX MICROSCOPIC - Abnormal; Notable for the following components:      Result Value   Color, Urine STRAW (*)    All other components within normal limits  CBC - Abnormal; Notable for the following components:   Hemoglobin 11.8 (*)    HCT 35.5 (*)    All other components within normal limits  I-STAT CHEM 8, ED - Abnormal; Notable for the following components:   Calcium, Ion 1.10 (*)    All other components within normal limits  I-STAT BETA HCG BLOOD, ED ([facility], [facility], [facility] ONLY)    EKG  EKG Interpretation None       Radiology Ct Abdomen Pelvis W Contrast  Result Date: 07/10/2017 CLINICAL DATA:  Acute onset of generalized abdominal pain. EXAM: CT ABDOMEN AND PELVIS WITH CONTRAST TECHNIQUE: Multidetector CT imaging of the abdomen and pelvis was performed using the standard protocol following bolus administration of intravenous contrast. CONTRAST:  100 mL of Isovue 300 IV contrast COMPARISON:  CT of the abdomen and pelvis performed 07/15/2015, and MRI/MRA of the chest, abdomen and pelvis performed 07/20/2015. Right upper quadrant ultrasound performed 02/05/2017 FINDINGS: Lower chest: Minimal scarring is noted at the lung bases. The visualized portions of the mediastinum are unremarkable. Hepatobiliary: The liver is unremarkable in appearance. The gallbladder is unremarkable in appearance. The common bile duct remains normal in caliber. Pancreas: The pancreas is within normal limits. Spleen: The spleen is unremarkable in appearance. Adrenals/Urinary Tract: The adrenal glands are unremarkable in appearance. The kidneys are  within normal limits. There is no evidence of hydronephrosis. No renal or ureteral stones are identified. No perinephric stranding is seen. Stomach/Bowel: The stomach is unremarkable in appearance. The small bowel is within normal limits. The appendix is normal in caliber, without evidence of appendicitis. The colon is unremarkable in appearance. Scattered pericecal nodes remain normal in size. Vascular/Lymphatic: Minimal calcification is seen along the abdominal aorta and  its branches. No retroperitoneal or pelvic sidewall lymphadenopathy is seen. Reproductive: Multiple prominent uterine fibroids are noted. No suspicious adnexal masses are identified. The bladder is decompressed, with a Foley catheter in place. Other: No additional soft tissue abnormalities are seen. Musculoskeletal: No acute osseous abnormalities are identified. The visualized musculature is unremarkable in appearance. IMPRESSION: 1. No acute abnormality seen within the abdomen or pelvis. 2. Multiple prominent uterine fibroids noted. Electronically Signed   By: Garald Balding M.D.   On: 07/10/2017 02:37    Procedures Procedures (including critical care time)  Medications Ordered in ED Medications  iopamidol (ISOVUE-300) 61 % injection 100 mL (100 mLs Intravenous Contrast Given 07/10/17 0209)  sodium chloride 0.9 % injection (  Given by Other 07/10/17 0251)     Initial Impression / Assessment and Plan / ED Course  I have reviewed the triage vital signs and the nursing notes.  Pertinent labs & imaging results that were available during my care of the patient were reviewed by me and considered in my medical decision making (see chart for details).     Patient comes in with chief complaint of urinary retention.  According to nurse, patient had a bladder scan which showed > 400 cc of urine.  Patient did have an episode of voiding in the ER, asked for another postvoid residual, which showed urine of greater than 200 cc, and a Foley  catheter was placed based on orders placed at triage.  I discussed with the patient my findings that she might not actually have urinary retention, and if she has urine retention it is in the equivocal range.  We gave her the option of removing the Foley catheter, and she can return to the ER if she has problems versus leaving the hospital with a Foley catheter in getting urology to evaluate.  Patient prefers the latter.  CT scan of the abdomen was ordered, with concerns for diverticulitis and it is negative.  As far as the lesion to the gluteal fold is concerned, there is confluence of pustules of undetermined etiology.  Could be side effects of the antibiotics, or could be cellulitis/dermatitis.  Could also be eczematous pustulosis.  Doubt shingles or herpes.  PCP follow-up advised.  We will start on doxycycline.  Final Clinical Impressions(s) / ED Diagnoses   Final diagnoses:  Pustules determined by examination  Acute urinary retention    ED Discharge Orders        Ordered    doxycycline (VIBRAMYCIN) 100 MG capsule  2 times daily     07/10/17 0305       Varney Biles, MD 07/10/17 0321

## 2017-07-10 NOTE — Telephone Encounter (Signed)
Copied from [place] [phone] [phone]. Topic: Inquiry >> Jul 10, 2017  4:34 PM Cecelia Byars, NT wrote: Reason for CRM: Patient called back said she was in the ed earlier please call back

## 2017-07-11 NOTE — Telephone Encounter (Signed)
Pt stated the catheter was twisted up that's why she went to the ER, she also wants to find a Dr. In the [facility] Pay system, but pt stated she will call us back

## 2017-08-05 ENCOUNTER — Encounter: Payer: Self-pay | Admitting: Physician Assistant

## 2017-08-05 DIAGNOSIS — R339 Retention of urine, unspecified: Secondary | ICD-10-CM | POA: Insufficient documentation

## 2017-08-24 ENCOUNTER — Other Ambulatory Visit: Payer: Self-pay | Admitting: Physician Assistant

## 2017-08-24 ENCOUNTER — Telehealth: Payer: Self-pay | Admitting: Physician Assistant

## 2017-08-24 DIAGNOSIS — I10 Essential (primary) hypertension: Secondary | ICD-10-CM

## 2017-08-24 NOTE — Telephone Encounter (Signed)
Pt called requesting English to call in something to help with her cough and horseness.  She saw Sagardia in February and she is still having symptoms. I advised that she might need to be seen again for this condition. She also states that her inhaler is also running low.  Pt states she has about 1 or 2 puffs left.  Please advise  [phone]

## 2017-08-26 ENCOUNTER — Ambulatory Visit: Payer: BC Managed Care – PPO | Admitting: Physician Assistant

## 2017-08-26 NOTE — Telephone Encounter (Signed)
Patient had an appointment and cancelled.   She will need to be seen for medication

## 2017-09-11 ENCOUNTER — Encounter: Payer: Self-pay | Admitting: Physician Assistant

## 2017-09-28 ENCOUNTER — Other Ambulatory Visit: Payer: Self-pay | Admitting: Physician Assistant

## 2017-09-28 ENCOUNTER — Other Ambulatory Visit: Payer: Self-pay

## 2017-09-28 ENCOUNTER — Ambulatory Visit: Payer: BC Managed Care – PPO | Admitting: Physician Assistant

## 2017-09-28 ENCOUNTER — Encounter: Payer: Self-pay | Admitting: Physician Assistant

## 2017-09-28 VITALS — BP 130/90 | HR 96 | Temp 98.4°F | Resp 18 | Ht 63.07 in | Wt 199.6 lb

## 2017-09-28 DIAGNOSIS — E785 Hyperlipidemia, unspecified: Secondary | ICD-10-CM

## 2017-09-28 DIAGNOSIS — I10 Essential (primary) hypertension: Secondary | ICD-10-CM

## 2017-09-28 DIAGNOSIS — J9801 Acute bronchospasm: Secondary | ICD-10-CM | POA: Diagnosis not present

## 2017-09-28 DIAGNOSIS — J22 Unspecified acute lower respiratory infection: Secondary | ICD-10-CM | POA: Diagnosis not present

## 2017-09-28 DIAGNOSIS — R1314 Dysphagia, pharyngoesophageal phase: Secondary | ICD-10-CM | POA: Diagnosis not present

## 2017-09-28 DIAGNOSIS — K219 Gastro-esophageal reflux disease without esophagitis: Secondary | ICD-10-CM

## 2017-09-28 DIAGNOSIS — J040 Acute laryngitis: Secondary | ICD-10-CM

## 2017-09-28 MED ORDER — LANSOPRAZOLE 30 MG PO CPDR: 30.0000 mg | DELAYED_RELEASE_CAPSULE | Freq: Two times a day (BID) | ORAL | 11 refills | Status: DC

## 2017-09-28 MED ORDER — ALBUTEROL SULFATE HFA 108 (90 BASE) MCG/ACT IN AERS: 2.0000 | INHALATION_SPRAY | RESPIRATORY_TRACT | 1 refills | Status: DC | PRN

## 2017-09-28 MED ORDER — ATORVASTATIN CALCIUM 40 MG PO TABS: 40.0000 mg | ORAL_TABLET | Freq: Every day | ORAL | 3 refills | Status: DC

## 2017-09-28 NOTE — Patient Instructions (Addendum)
DASH Eating Plan DASH stands for "Dietary Approaches to Stop Hypertension." The DASH eating plan is a healthy eating plan that has been shown to reduce high blood pressure (hypertension). It may also reduce your risk for type 2 diabetes, heart disease, and stroke. The DASH eating plan may also help with weight loss. What are tips for following this plan? General guidelines  Avoid eating more than 2,300 mg (milligrams) of salt (sodium) a day. If you have hypertension, you may need to reduce your sodium intake to 1,500 mg a day.  Limit alcohol intake to no more than 1 drink a day for nonpregnant women and 2 drinks a day for men. One drink equals 12 oz of beer, 5 oz of wine, or 1 oz of hard liquor.  Work with your health care provider to maintain a healthy body weight or to lose weight. Ask what an ideal weight is for you.  Get at least 30 minutes of exercise that causes your heart to beat faster (aerobic exercise) most days of the week. Activities may include walking, swimming, or biking.  Work with your health care provider or diet and nutrition specialist (dietitian) to adjust your eating plan to your individual calorie needs. Reading food labels  Check food labels for the amount of sodium per serving. Choose foods with less than 5 percent of the Daily Value of sodium. Generally, foods with less than 300 mg of sodium per serving fit into this eating plan.  To find whole grains, look for the word "whole" as the first word in the ingredient list. Shopping  Buy products labeled as "low-sodium" or "no salt added."  Buy fresh foods. Avoid canned foods and premade or frozen meals. Cooking  Avoid adding salt when cooking. Use salt-free seasonings or herbs instead of table salt or sea salt. Check with your health care provider or pharmacist before using salt substitutes.  Do not fry foods. Cook foods using healthy methods such as baking, boiling, grilling, and broiling instead.  Cook  with heart-healthy oils, such as olive, canola, soybean, or sunflower oil. Meal planning   Eat a balanced diet that includes: ? 5 or more servings of fruits and vegetables each day. At each meal, try to fill half of your plate with fruits and vegetables. ? Up to 6-8 servings of whole grains each day. ? Less than 6 oz of lean meat, poultry, or fish each day. A 3-oz serving of meat is about the same size as a deck of cards. One egg equals 1 oz. ? 2 servings of low-fat dairy each day. ? A serving of nuts, seeds, or beans 5 times each week. ? Heart-healthy fats. Healthy fats called Omega-3 fatty acids are found in foods such as flaxseeds and coldwater fish, like sardines, salmon, and mackerel.  Limit how much you eat of the following: ? Canned or prepackaged foods. ? Food that is high in trans fat, such as fried foods. ? Food that is high in saturated fat, such as fatty meat. ? Sweets, desserts, sugary drinks, and other foods with added sugar. ? Full-fat dairy products.  Do not salt foods before eating.  Try to eat at least 2 vegetarian meals each week.  Eat more home-cooked food and less restaurant, buffet, and fast food.  When eating at a restaurant, ask that your food be prepared with less salt or no salt, if possible. What foods are recommended? The items listed may not be a complete list. Talk with your dietitian  about what dietary choices are best for you. Grains Whole-grain or whole-wheat bread. Whole-grain or whole-wheat pasta. Brown rice. [place]. Bulgur. Whole-grain and low-sodium cereals. Pita bread. Low-fat, low-sodium crackers. Whole-wheat flour tortillas. Vegetables Fresh or frozen vegetables (raw, steamed, roasted, or grilled). Low-sodium or reduced-sodium tomato and vegetable juice. Low-sodium or reduced-sodium tomato sauce and tomato paste. Low-sodium or reduced-sodium canned vegetables. Fruits All fresh, dried, or frozen fruit. Canned fruit in natural juice  (without added sugar). Meat and other protein foods Skinless chicken or [place]. Ground chicken or [place]. Pork with fat trimmed off. Fish and seafood. Egg whites. Dried beans, peas, or lentils. Unsalted nuts, nut butters, and seeds. Unsalted canned beans. Lean cuts of beef with fat trimmed off. Low-sodium, lean deli meat. Dairy Low-fat (1%) or fat-free (skim) milk. Fat-free, low-fat, or reduced-fat cheeses. Nonfat, low-sodium ricotta or cottage cheese. Low-fat or nonfat yogurt. Low-fat, low-sodium cheese. Fats and oils Soft margarine without trans fats. Vegetable oil. Low-fat, reduced-fat, or light mayonnaise and salad dressings (reduced-sodium). Canola, safflower, olive, soybean, and sunflower oils. Avocado. Seasoning and other foods Herbs. Spices. Seasoning mixes without salt. Unsalted popcorn and pretzels. Fat-free sweets. What foods are not recommended? The items listed may not be a complete list. Talk with your dietitian about what dietary choices are best for you. Grains Baked goods made with fat, such as croissants, muffins, or some breads. Dry pasta or rice meal packs. Vegetables Creamed or fried vegetables. Vegetables in a cheese sauce. Regular canned vegetables (not low-sodium or reduced-sodium). Regular canned tomato sauce and paste (not low-sodium or reduced-sodium). Regular tomato and vegetable juice (not low-sodium or reduced-sodium). Angie Fava. Olives. Fruits Canned fruit in a light or heavy syrup. Fried fruit. Fruit in cream or butter sauce. Meat and other protein foods Fatty cuts of meat. Ribs. Fried meat. Berniece Salines. Sausage. Bologna and other processed lunch meats. Salami. Fatback. Hotdogs. Bratwurst. Salted nuts and seeds. Canned beans with added salt. Canned or smoked fish. Whole eggs or egg yolks. Chicken or [place] with skin. Dairy Whole or 2% milk, cream, and half-and-half. Whole or full-fat cream cheese. Whole-fat or sweetened yogurt. Full-fat cheese. Nondairy creamers. Whipped  toppings. Processed cheese and cheese spreads. Fats and oils Butter. Stick margarine. Lard. Shortening. Ghee. Bacon fat. Tropical oils, such as coconut, palm kernel, or palm oil. Seasoning and other foods Salted popcorn and pretzels. Onion salt, garlic salt, seasoned salt, table salt, and sea salt. Worcestershire sauce. Tartar sauce. Barbecue sauce. Teriyaki sauce. Soy sauce, including reduced-sodium. Steak sauce. Canned and packaged gravies. Fish sauce. Oyster sauce. Cocktail sauce. Horseradish that you find on the shelf. Ketchup. Mustard. Meat flavorings and tenderizers. Bouillon cubes. Hot sauce and Tabasco sauce. Premade or packaged marinades. Premade or packaged taco seasonings. Relishes. Regular salad dressings. Where to find more information:  National Heart, Lung, and [place]: https://wilson-eaton.com/  American Heart Association: www.heart.org Summary  The DASH eating plan is a healthy eating plan that has been shown to reduce high blood pressure (hypertension). It may also reduce your risk for type 2 diabetes, heart disease, and stroke.  With the DASH eating plan, you should limit salt (sodium) intake to 2,300 mg a day. If you have hypertension, you may need to reduce your sodium intake to 1,500 mg a day.  When on the DASH eating plan, aim to eat more fresh fruits and vegetables, whole grains, lean proteins, low-fat dairy, and heart-healthy fats.  Work with your health care provider or diet and nutrition specialist (dietitian) to adjust your eating plan to  your individual calorie needs. This information is not intended to replace advice given to you by your health care provider. Make sure you discuss any questions you have with your health care provider. Document Released: 05/10/2011 Document Revised: 05/14/2016 Document Reviewed: 05/14/2016 Elsevier Interactive Patient Education  2018 [employer].   IF you received an x-ray today, you will receive an invoice from [facility]  Radiology. Please contact [facility] Radiology at [phone] with questions or concerns regarding your invoice.   IF you received labwork today, you will receive an invoice from [place]. Please contact [facility] at [phone] with questions or concerns regarding your invoice.   Our billing staff will not be able to assist you with questions regarding bills from these companies.  You will be contacted with the lab results as soon as they are available. The fastest way to get your results is to activate your My Chart account. Instructions are located on the last page of this paperwork. If you have not heard from [place] regarding the results in 2 weeks, please contact this office.

## 2017-09-28 NOTE — Progress Notes (Signed)
PRIMARY CARE AT [facility] [address], [place] [address] [phone]  Date:  09/28/2017   Name:  Marilyn [name]   DOB:  1966-05-11   MRN:  [id number]  PCP:  Joretta Bachelor, PA    History of Present Illness:  Marilyn [name] is a 52 y.o. female patient who presents to PCP with  Chief Complaint  Patient presents with  . Hypertension    f/u  . Medication Refill    Microzide, prevacid     HTN: she is compliant on blood pressure.  She took last dose yesterday.  She is not hydrating much with sodas.  She is eating breads.  GERD: she is having reflux symptoms.  The prevacid does help.  Appears like it may help sometimes.  She is eating fried foods.    No cp, palpitations, sob, or leg swellings.   Wt Readings from Last 3 Encounters:  09/28/17 199 lb 9.6 oz (90.5 kg)  07/05/17 199 lb 3.2 oz (90.4 kg)  06/18/17 199 lb (90.3 kg)    Patient Active Problem List   Diagnosis Date Noted  . Urinary retention 08/05/2017  . Atypical chest pain 01/23/2017  . Nausea & vomiting 07/16/2015  . RUQ abdominal pain 07/16/2015  . RLQ abdominal pain 07/15/2015  . Pain in lower back 11/30/2014  . Acute upper respiratory infection 05/05/2014  . Allergic rhinitis 05/05/2014  . Hoarseness 05/05/2014  . Dysphagia 05/05/2014  . Obesity 04/11/2014  . Edema, peripheral 04/11/2014  . Cough 12/20/2012  . Hypersomnolence 12/15/2012  . DOE (dyspnea on exertion) 12/15/2012  . Right shoulder pain 12/15/2012  . Abdominal pain 11/15/2011  . HTN (hypertension) 09/09/2011  . Neck pain 09/09/2011  . Preventative health care 04/30/2011  . HOT FLASHES 12/22/2009  . CHEST PAIN 01/29/2009  . Acute non-recurrent maxillary sinusitis 09/25/2008  . HYPERTHYROIDISM 01/31/2007  . GERD 01/31/2007  . Hypothyroidism 01/30/2007  . Hyperlipidemia 01/30/2007  . Iron deficiency anemia 01/30/2007  . ASTHMA 01/30/2007    Past Medical History:  Diagnosis Date  . Allergy   . ANEMIA-IRON DEFICIENCY  01/30/2007  . ASTHMA 01/30/2007  . Asthma   . Breast tumor    right; benign  . Chest pain 01/24/2017  . GERD 01/31/2007  . Hemorrhoids   . Hiatal hernia   . HTN (hypertension) 09/09/2011  . Hyperlipidemia   . HYPOTHYROIDISM 01/30/2007  . Mallory Mariel Kansky tear 04/2003    Past Surgical History:  Procedure Laterality Date  . BREAST LUMPECTOMY Right    benign  . BREAST LUMPECTOMY WITH RADIOACTIVE SEED LOCALIZATION Right 02/25/2015   Procedure: BREAST LUMPECTOMY WITH RADIOACTIVE SEED LOCALIZATION;  Surgeon: Alphonsa Overall, MD;  Location: [facility];  Service: General;  Laterality: Right;  . BREAST REDUCTION SURGERY  1986  . BREAST SURGERY    . broken jaw    . CARPAL TUNNEL RELEASE  2001  . FRACTURE SURGERY    . REDUCTION MAMMAPLASTY Bilateral   . THYROIDECTOMY    . TUBAL LIGATION  1987    Social History   Tobacco Use  . Smoking status: Former Smoker    Packs/day: 0.25    Years: 3.00    Pack years: 0.75    Types: Cigarettes    Last attempt to quit: 06/04/1980    Years since quitting: 37.3  . Smokeless tobacco: Never Used  Substance Use Topics  . Alcohol use: No    Alcohol/week: 0.0 oz  . Drug use: No  Family History  Problem Relation Age of Onset  . Hypertension Father   . Diabetes Father   . Hyperlipidemia Father   . Asthma Mother   . Breast cancer Sister   . Thyroid disease Sister   . Diabetes Maternal Grandmother   . Heart disease Maternal Grandmother   . Heart attack Maternal Grandmother 60  . Asthma Maternal Grandfather   . Heart attack Maternal Grandfather   . Colon cancer Neg Hx     Allergies  Allergen Reactions  . Promethazine-Dm   . Tuna [Fish Allergy] Anaphylaxis  . Codeine Nausea And Vomiting  . Flonase [Fluticasone Propionate] Other (See Comments)    Nose bleeds    Medication list has been reviewed and updated.  Current Outpatient Medications on File Prior to Visit  Medication Sig Dispense Refill  . albuterol (PROVENTIL  HFA;VENTOLIN HFA) 108 (90 Base) MCG/ACT inhaler Inhale 2 puffs into the lungs every 4 (four) hours as needed for wheezing or shortness of breath (cough, shortness of breath or wheezing.). 1 Inhaler 1  . amLODipine (NORVASC) 10 MG tablet TAKE ONE TABLET BY MOUTH DAILY 30 tablet 0  . atorvastatin (LIPITOR) 40 MG tablet Take 1 tablet (40 mg total) by mouth daily. 90 tablet 3  . azithromycin (ZITHROMAX) 250 MG tablet Take 2 tabs PO x 1 dose, then 1 tab PO QD x 4 days (Patient not taking: Reported on 06/18/2017) 6 tablet 0  . benzonatate (TESSALON) 100 MG capsule Take 1-2 capsules (100-200 mg total) by mouth 3 (three) times daily as needed for cough. (Patient not taking: Reported on 06/18/2017) 40 capsule 0  . doxycycline (VIBRAMYCIN) 100 MG capsule Take 1 capsule (100 mg total) by mouth 2 (two) times daily. 14 capsule 0  . EPIPEN 2-PAK 0.3 MG/0.3ML SOAJ injection Inject 0.3 mg as directed as needed for anaphylaxis.    . famotidine (PEPCID) 20 MG tablet Take 1 tablet (20 mg total) by mouth 2 (two) times daily. (Patient not taking: Reported on 07/05/2017) 30 tablet 0  . Guaifenesin (MUCINEX MAXIMUM STRENGTH) 1200 MG TB12 Take 1 tablet (1,200 mg total) by mouth every 12 (twelve) hours as needed. (Patient not taking: Reported on 06/18/2017) 14 tablet 1  . hydrochlorothiazide (MICROZIDE) 12.5 MG capsule Take 1 capsule (12.5 mg total) by mouth daily. As needed for swelling (Patient not taking: Reported on 07/05/2017) 90 capsule 3  . Hyoscyamine Sulfate SL (LEVSIN/SL) 0.125 MG SUBL Place 1 tablet under the tongue every 6 (six) hours as needed. (Patient taking differently: Place 1 tablet under the tongue every 6 (six) hours as needed (cramp). ) 20 each 3  . lansoprazole (PREVACID) 30 MG capsule Take 1 capsule (30 mg total) by mouth 2 (two) times daily before a meal. (Patient not taking: Reported on 07/05/2017) 60 capsule 11  . levothyroxine (SYNTHROID, LEVOTHROID) 100 MCG tablet TAKE ONE TABLET BY MOUTH DAILY 30 tablet 0  .  naproxen sodium (ANAPROX) 550 MG tablet Take 1 tablet (550 mg total) by mouth 2 (two) times daily with a meal. (Patient not taking: Reported on 07/09/2017) 30 tablet 1  . pantoprazole (PROTONIX) 40 MG tablet Take 1 tablet (40 mg total) by mouth 2 (two) times daily. 60 tablet 2  . polyvinyl alcohol (LIQUIFILM TEARS) 1.4 % ophthalmic solution Place 1 drop into both eyes at bedtime. 15 mL 0  . promethazine-dextromethorphan (PROMETHAZINE-DM) 6.25-15 MG/5ML syrup Take 5 mLs by mouth 4 (four) times daily as needed for cough. 118 mL 0   No current facility-administered  medications on file prior to visit.     ROS ROS otherwise unremarkable unless listed above.  Physical Examination: BP 130/90 (BP Location: Right Arm, Patient Position: Sitting, Cuff Size: Large)   Pulse 96   Temp 98.4 F (36.9 C) (Oral)   Resp 18   Ht 5' 3.07" (1.602 m)   Wt 199 lb 9.6 oz (90.5 kg)   LMP 03/18/2014   SpO2 97%   BMI 35.28 kg/m  Ideal Body Weight: Weight in (lb) to have BMI = 25: 141.2 Wt Readings from Last 3 Encounters:  09/28/17 199 lb 9.6 oz (90.5 kg)  07/05/17 199 lb 3.2 oz (90.4 kg)  06/18/17 199 lb (90.3 kg)    Physical Exam  Constitutional: She is oriented to person, place, and time. She appears well-developed and well-nourished. No distress.  HENT:  Head: Normocephalic and atraumatic.  Right Ear: External ear normal.  Left Ear: External ear normal.  Eyes: Pupils are equal, round, and reactive to light. Conjunctivae and EOM are normal.  Cardiovascular: Normal rate, regular rhythm and normal heart sounds.  No murmur heard. Pulmonary/Chest: Effort normal. No respiratory distress.  Neurological: She is alert and oriented to person, place, and time.  Skin: She is not diaphoretic.  Psychiatric: She has a normal mood and affect. Her behavior is normal.    Assessment and Plan: Marilyn [name] is a 52 y.o. female who is here today for cc of  Chief Complaint  Patient presents with  . Hypertension     f/u  . Medication Refill    Microzide, prevacid  --follow up in 5 months for annual physical exam.  Recheck a1c at that time as well.  --encouraged exercise 4 times per week for 30 minutes --dash diet given Essential hypertension - Plan: CMP14+EGFR  Gastroesophageal reflux disease, esophagitis presence not specified - Plan: lansoprazole (PREVACID) 30 MG capsule  Dysphagia, pharyngoesophageal phase - Plan: lansoprazole (PREVACID) 30 MG capsule  Hyperlipidemia, unspecified hyperlipidemia type - Plan: atorvastatin (LIPITOR) 40 MG tablet  Laryngitis - Plan: albuterol (PROVENTIL HFA;VENTOLIN HFA) 108 (90 Base) MCG/ACT inhaler  Lower respiratory infection - Plan: albuterol (PROVENTIL HFA;VENTOLIN HFA) 108 (90 Base) MCG/ACT inhaler  Bronchospasm - Plan: albuterol (PROVENTIL HFA;VENTOLIN HFA) 108 (90 Base) MCG/ACT inhaler  Ivar Drape, PA-C [facility] 4/30/20199:20 AM

## 2017-09-29 LAB — CMP14+EGFR
A/G RATIO: 1.3 (ref 1.2–2.2)
ALT: 30 IU/L (ref 0–32)
AST: 31 IU/L (ref 0–40)
Albumin: 4.4 g/dL (ref 3.5–5.5)
Alkaline Phosphatase: 71 IU/L (ref 39–117)
BUN/Creatinine Ratio: 13 (ref 9–23)
BUN: 12 mg/dL (ref 6–24)
Bilirubin Total: 0.3 mg/dL (ref 0.0–1.2)
CO2: 23 mmol/L (ref 20–29)
CREATININE: 0.9 mg/dL (ref 0.57–1.00)
Calcium: 8.9 mg/dL (ref 8.7–10.2)
Chloride: 99 mmol/L (ref 96–106)
GFR calc Af Amer: 84 mL/min/{1.73_m2} (ref >59)
GFR, EST NON AFRICAN AMERICAN: 73 mL/min/{1.73_m2} (ref >59)
Globulin, Total: 3.4 g/dL (ref 1.5–4.5)
Glucose: 82 mg/dL (ref 65–99)
POTASSIUM: 4.1 mmol/L (ref 3.5–5.2)
Sodium: 139 mmol/L (ref 134–144)
Total Protein: 7.8 g/dL (ref 6.0–8.5)

## 2017-10-05 ENCOUNTER — Other Ambulatory Visit: Payer: Self-pay | Admitting: Physician Assistant

## 2017-11-05 ENCOUNTER — Ambulatory Visit: Payer: BC Managed Care – PPO | Admitting: Urgent Care

## 2017-11-05 ENCOUNTER — Encounter: Payer: Self-pay | Admitting: Urgent Care

## 2017-11-05 VITALS — BP 138/88 | HR 93 | Resp 16 | Ht 63.0 in | Wt 201.0 lb

## 2017-11-05 DIAGNOSIS — R7989 Other specified abnormal findings of blood chemistry: Secondary | ICD-10-CM

## 2017-11-05 DIAGNOSIS — Z9889 Other specified postprocedural states: Secondary | ICD-10-CM

## 2017-11-05 DIAGNOSIS — M7989 Other specified soft tissue disorders: Secondary | ICD-10-CM | POA: Diagnosis not present

## 2017-11-05 DIAGNOSIS — M79661 Pain in right lower leg: Secondary | ICD-10-CM | POA: Diagnosis not present

## 2017-11-05 DIAGNOSIS — E890 Postprocedural hypothyroidism: Secondary | ICD-10-CM

## 2017-11-05 DIAGNOSIS — Z9009 Acquired absence of other part of head and neck: Secondary | ICD-10-CM

## 2017-11-05 DIAGNOSIS — K219 Gastro-esophageal reflux disease without esophagitis: Secondary | ICD-10-CM

## 2017-11-05 LAB — POCT CBC
Granulocyte percent: 50 %G (ref 37–80)
HCT, POC: 37.5 % — AB (ref 37.7–47.9)
HEMOGLOBIN: 12.3 g/dL (ref 12.2–16.2)
Lymph, poc: 2 (ref 0.6–3.4)
MCH: 27.2 pg (ref 27–31.2)
MCHC: 32.8 g/dL (ref 31.8–35.4)
MCV: 82.9 fL (ref 80–97)
MID (cbc): 2 — AB (ref 0–0.9)
MPV: 7 fL (ref 0–99.8)
POC Granulocyte: 2.2 (ref 2–6.9)
POC LYMPH PERCENT: 44.4 %L (ref 10–50)
POC MID %: 5.6 %M (ref 0–12)
Platelet Count, POC: 242 10*3/uL (ref 142–424)
RBC: 4.53 M/uL (ref 4.04–5.48)
RDW, POC: 13.3 %
WBC: 4.4 10*3/uL — AB (ref 4.6–10.2)

## 2017-11-05 MED ORDER — LEVOTHYROXINE SODIUM 100 MCG PO TABS: ORAL_TABLET | ORAL | 1 refills | Status: DC

## 2017-11-05 MED ORDER — NAPROXEN SODIUM 550 MG PO TABS: 550.0000 mg | ORAL_TABLET | Freq: Two times a day (BID) | ORAL | 1 refills | Status: DC

## 2017-11-05 NOTE — Progress Notes (Signed)
MRN: [id number] DOB: 28-Feb-1966  Subjective:   Marilyn [name] is a 52 y.o. female presenting for 1 day history of swelling and pain over her right front part of her lower leg.  Admits that she has pain that radiates down from her right lower leg down to her foot, denies calf pain.  She also reports that she has had some midsternal chest pain in the past few days associated with eating, has been noncompliant with her diet in terms of her GERD symptoms.  Reports that her symptoms have resolved with lansoprazole.  Denies fever, trauma, weakness, numbness or tingling.  Denies shortness of breath, heart racing, history of blood clot.  Marilyn [name] has a current medication list which includes the following prescription(s): albuterol, amlodipine, atorvastatin, epipen 2-pak, lansoprazole, levothyroxine, and polyvinyl alcohol. Also is allergic to promethazine-dm; tuna [fish allergy]; codeine; and flonase [fluticasone propionate].  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 Also  has a past surgical history that includes Tubal ligation (1987); broken jaw; Breast reduction surgery (1986); Carpal tunnel release (2001); Thyroidectomy; Breast lumpectomy with radioactive seed localization (Right, 02/25/2015); Fracture surgery; Breast surgery; Reduction mammaplasty (Bilateral); and Breast lumpectomy (Right).  Objective:   Vitals: BP 138/88   Pulse 93   Resp 16   Ht 5\' 3"  (1.6 m)   Wt 201 lb (91.2 kg)   LMP 03/18/2014   SpO2 98%   BMI 35.61 kg/m   Physical Exam  Constitutional: She is oriented to person, place, and time. She appears well-developed and well-nourished.  HENT:  Mouth/Throat: Oropharynx is clear and moist.  Cardiovascular: Normal rate,  regular rhythm and intact distal pulses. Exam reveals no gallop and no friction rub.  No murmur heard. Pulmonary/Chest: No respiratory distress. She has no wheezes. She has no rales.  Musculoskeletal:       Right lower leg: She exhibits tenderness (over area depicted) and swelling (with associated ecchymosis). She exhibits no bony tenderness, no edema, no deformity and no laceration.       Legs: Right dorsalis pedis is 2+.    Neurological: She is alert and oriented to person, place, and time.  Skin: Skin is warm and dry.   Results for orders placed or performed in visit on 11/05/17 (from the past 24 hour(s))  POCT CBC     Status: Abnormal   Collection Time: 11/05/17  4:36 PM  Result Value Ref Range   WBC 4.4 (A) 4.6 - 10.2 K/uL   Lymph, poc 2.0 0.6 - 3.4   POC LYMPH PERCENT 44.4 10 - 50 %L   MID (cbc) 2.0 (A) 0 - 0.9   POC MID % 5.6 0 - 12 %M   POC Granulocyte 2.2 2 - 6.9   Granulocyte percent 50.0 37 - 80 %G   RBC 4.53 4.04 - 5.48 M/uL   Hemoglobin 12.3 12.2 - 16.2 g/dL   HCT, POC 37.5 (A) 37.7 - 47.9 %   MCV 82.9 80 - 97 fL   MCH, POC 27.2 27 - 31.2 pg   MCHC 32.8 31.8 - 35.4 g/dL   RDW, POC 13.3 %   Platelet Count, POC 242 142 - 424 K/uL   MPV 7.0 0 - 99.8 fL    Assessment and Plan :   Pain in right lower leg - Plan: POCT CBC, Basic  metabolic panel, VAS [place] LOWER EXTREMITY VENOUS (DVT)  Pain and swelling of right lower leg - Plan: POCT CBC, Basic metabolic panel, VAS [place] LOWER EXTREMITY VENOUS (DVT)  H/O thyroidectomy  Low TSH level  Gastroesophageal reflux disease, esophagitis presence not specified  I do not suspect the patient is having a chest pain given absence of tachycardia, tachypnea, chest pain consistent with PE.  She does have a history of GERD and her chest pain is responding to her lansoprazole.  We will pursue a lower extremity ultrasound of her right lower leg.  In the meantime she is can use naproxen for pain and inflammation.  Upon review of her thyroid  medications patient is stable, she requested refills at the end of her visit.  I provided a courtesy refill to last for 6 months.  She is to follow-up at that time.  Jaynee Eagles, PA-C [facility] [phone] 11/05/2017  4:14 PM

## 2017-11-05 NOTE — Patient Instructions (Addendum)
Your venous ultrasound is scheduled tomorrow 11/06/17 at 3pm at [facility]. Please check in at the admitting desk located at [facility].     IF you received an x-ray today, you will receive an invoice from [facility]. Please contact [facility] Radiology at [phone] with questions or concerns regarding your invoice.   IF you received labwork today, you will receive an invoice from [place]. Please contact [facility] at [phone] with questions or concerns regarding your invoice.   Our billing staff will not be able to assist you with questions regarding bills from these companies.  You will be contacted with the lab results as soon as they are available. The fastest way to get your results is to activate your My Chart account. Instructions are located on the last page of this paperwork. If you have not heard from [place] regarding the results in 2 weeks, please contact this office.

## 2017-11-06 ENCOUNTER — Ambulatory Visit (HOSPITAL_COMMUNITY): Admission: RE | Admit: 2017-11-06 | Payer: BC Managed Care – PPO | Source: Ambulatory Visit

## 2017-11-06 LAB — BASIC METABOLIC PANEL
BUN / CREAT RATIO: 18 (ref 9–23)
BUN: 12 mg/dL (ref 6–24)
CHLORIDE: 104 mmol/L (ref 96–106)
CO2: 24 mmol/L (ref 20–29)
Calcium: 8.7 mg/dL (ref 8.7–10.2)
Creatinine, Ser: 0.65 mg/dL (ref 0.57–1.00)
GFR calc Af Amer: 117 mL/min/{1.73_m2} (ref >59)
GFR calc non Af Amer: 102 mL/min/{1.73_m2} (ref >59)
GLUCOSE: 92 mg/dL (ref 65–99)
POTASSIUM: 4.5 mmol/L (ref 3.5–5.2)
SODIUM: 141 mmol/L (ref 134–144)

## 2017-11-07 ENCOUNTER — Ambulatory Visit (HOSPITAL_COMMUNITY)
Admission: RE | Admit: 2017-11-07 | Discharge: 2017-11-07 | Disposition: A | Discharge status: Home / Self Care | Payer: BC Managed Care – PPO | Source: Ambulatory Visit | Attending: Urgent Care | Admitting: Urgent Care

## 2017-11-07 ENCOUNTER — Encounter (HOSPITAL_COMMUNITY): Payer: Self-pay | Admitting: Urgent Care

## 2017-11-07 DIAGNOSIS — M7989 Other specified soft tissue disorders: Secondary | ICD-10-CM | POA: Diagnosis not present

## 2017-11-07 DIAGNOSIS — M79661 Pain in right lower leg: Secondary | ICD-10-CM | POA: Diagnosis not present

## 2017-11-07 NOTE — Progress Notes (Signed)
Preliminary results by tech - Venous Duplex Lower Ext. Completed. Negative for deep and superficial vein thrombosis. Results given to [facility],  Pa-C. Oda Cogan, BS, RDMS, RVT

## 2017-12-27 ENCOUNTER — Ambulatory Visit (INDEPENDENT_AMBULATORY_CARE_PROVIDER_SITE_OTHER): Payer: BC Managed Care – PPO | Admitting: Urgent Care

## 2017-12-27 ENCOUNTER — Encounter: Payer: Self-pay | Admitting: Urgent Care

## 2017-12-27 VITALS — BP 123/82 | HR 107 | Temp 98.8°F | Resp 17 | Ht 63.0 in | Wt 202.0 lb

## 2017-12-27 DIAGNOSIS — R109 Unspecified abdominal pain: Secondary | ICD-10-CM | POA: Diagnosis not present

## 2017-12-27 DIAGNOSIS — R635 Abnormal weight gain: Secondary | ICD-10-CM

## 2017-12-27 DIAGNOSIS — M545 Low back pain, unspecified: Secondary | ICD-10-CM

## 2017-12-27 DIAGNOSIS — M542 Cervicalgia: Secondary | ICD-10-CM

## 2017-12-27 DIAGNOSIS — R1084 Generalized abdominal pain: Secondary | ICD-10-CM

## 2017-12-27 DIAGNOSIS — R0789 Other chest pain: Secondary | ICD-10-CM | POA: Diagnosis not present

## 2017-12-27 DIAGNOSIS — K5900 Constipation, unspecified: Secondary | ICD-10-CM

## 2017-12-27 LAB — POCT URINALYSIS DIP (MANUAL ENTRY)
BILIRUBIN UA: NEGATIVE
Blood, UA: NEGATIVE
GLUCOSE UA: NEGATIVE mg/dL
Ketones, POC UA: NEGATIVE mg/dL
LEUKOCYTES UA: NEGATIVE
NITRITE UA: NEGATIVE
Protein Ur, POC: NEGATIVE mg/dL
Spec Grav, UA: 1.02 (ref 1.010–1.025)
UROBILINOGEN UA: 0.2 U/dL
pH, UA: 5 (ref 5.0–8.0)

## 2017-12-27 MED ORDER — CYCLOBENZAPRINE HCL 5 MG PO TABS: 5.0000 mg | ORAL_TABLET | Freq: Three times a day (TID) | ORAL | 1 refills | Status: AC | PRN

## 2017-12-27 MED ORDER — MELOXICAM 7.5 MG PO TABS: 7.5000 mg | ORAL_TABLET | Freq: Every day | ORAL | 0 refills | Status: DC

## 2017-12-27 MED ORDER — POLYETHYLENE GLYCOL 3350 17 GM/SCOOP PO POWD: 17.0000 g | Freq: Every day | ORAL | 1 refills | Status: DC

## 2017-12-27 MED ORDER — DOCUSATE SODIUM 50 MG PO CAPS: 50.0000 mg | ORAL_CAPSULE | Freq: Two times a day (BID) | ORAL | 5 refills | Status: DC

## 2017-12-27 NOTE — Patient Instructions (Addendum)
Salads - kale, spinach, cabbage, spring mix; use seeds like pumpkin seeds or sunflower seeds; you can also use 1-2 hard boiled eggs. Fruits - avocadoes, berries (blueberries, raspberries, blackberries), apples, oranges, pomegranate, grapefruit Seeds - quinoa, chia seeds; you can also incorporate oatmeal Vegetables - aspargus, cauliflower, broccoli, green beans, brussel spouts, bell peppers; stay away from starchy vegetables like potatoes, carrots, peas   Please use Miralax for moderate to severe constipation. Take this once a day for the next 2-3 days. Please also start Colace (docusate) stool softener, 1-2 times daily.  To help reduce constipation and promote bowel health: 1. Drink at least 64 ounces of water each day 2. Eat plenty of fiber (fruits, vegetables, whole grains, legumes) 3. Be physically active or exercise including walking, jogging, swimming, yoga, etc. 4. For active constipation use a stool softener (docusate) or an osmotic laxative (like Miralax) each day, or as needed.      Musculoskeletal Pain Musculoskeletal pain is muscle and bone aches and pains. This pain can occur in any part of the body. Follow these instructions at home:  Only take medicines for pain, discomfort, or fever as told by your health care provider.  You may continue all activities unless the activities cause more pain. When the pain lessens, slowly resume normal activities. Gradually increase the intensity and duration of the activities or exercise.  During periods of severe pain, bed rest may be helpful. Lie or sit in any position that is comfortable, but get out of bed and walk around at least every several hours.  If directed, put ice on the injured area. ? Put ice in a plastic bag. ? Place a towel between your skin and the bag. ? Leave the ice on for 20 minutes, 2-3 times a day. Contact a health care provider if:  Your pain is getting worse.  Your pain is not relieved with medicines.  You  lose function in the area of the pain if the pain is in your arms, legs, or neck. This information is not intended to replace advice given to you by your health care provider. Make sure you discuss any questions you have with your health care provider. Document Released: 05/21/2005 Document Revised: 11/01/2015 Document Reviewed: 01/23/2013 Elsevier Interactive Patient Education  2017 [employer].     IF you received an x-ray today, you will receive an invoice from [facility]. Please contact [facility] at [phone] with questions or concerns regarding your invoice.   IF you received labwork today, you will receive an invoice from [place] [facility]. Please contact [facility] at [phone] with questions or concerns regarding your invoice.   Our billing staff will not be able to assist you with questions regarding bills from these companies.  You will be contacted with the lab results as soon as they are available. The fastest way to get your results is to activate your My Chart account. Instructions are located on the last page of this paperwork. If you have not heard from [place] regarding the results in 2 weeks, please contact this office.

## 2017-12-27 NOTE — Progress Notes (Addendum)
MRN: [id number] DOB: May 30, 1966  Subjective:   Marilyn [name] is a 52 y.o. female presenting for 2-day history of left mid to low back pain, left flank pain that radiates across to her left abdomen, generalized abdominal pain, 2 episodes of transient sharp mid to left-sided chest pain.  Patient is very worried that something is wrong with her heart or her belly.  He has already had 2 colonoscopies without any significant findings.  She has a history of hypertension currently well controlled with amlodipine and also has hypothyroidism, well controlled with levothyroxine.  Denies fever, diaphoresis, limb pain, weakness, nausea, vomiting, hematuria, dysuria, urinary frequency.  Denies history of renal stone, diabetes, heart disease.  Patient has not tried medications for relief.  She admits that she has been eating fairly unhealthily including a lot of pizza, potatoes, starchy foods.  Has a history of long-standing constipation, worse in the past few months.  Reports small, hard stools, painful defecation.  She is not currently taking any medications for this but has had to use MiraLAX in the past.  She is also stopped exercising and is not hydrating very well either.  Denies smoking cigarettes.  Marilyn [name] has a current medication list which includes the following prescription(s): albuterol, amlodipine, atorvastatin, epipen 2-pak, lansoprazole, levothyroxine, naproxen sodium, and polyvinyl alcohol. Also is allergic to promethazine-dm; tuna [fish allergy]; codeine; and flonase [fluticasone propionate].  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 Also  has a past surgical history that includes Tubal ligation (1987); broken jaw; Breast reduction surgery  (1986); Carpal tunnel release (2001); Thyroidectomy; Breast lumpectomy with radioactive seed localization (Right, 02/25/2015); Fracture surgery; Breast surgery; Reduction mammaplasty (Bilateral); and Breast lumpectomy (Right).  Objective:   Vitals: BP 123/82   Pulse (!) 107   Temp 98.8 F (37.1 C) (Oral)   Resp 17   Ht 5\' 3"  (1.6 m)   Wt 202 lb (91.6 kg)   LMP 03/18/2014   SpO2 98%   BMI 35.78 kg/m   Wt Readings from Last 3 Encounters:  12/27/17 202 lb (91.6 kg)  11/05/17 201 lb (91.2 kg)  09/28/17 199 lb 9.6 oz (90.5 kg)    Physical Exam  Constitutional: She is oriented to person, place, and time. She appears well-developed and well-nourished.  HENT:  Mouth/Throat: Oropharynx is clear and moist.  Eyes: Right eye exhibits no discharge. Left eye exhibits no discharge. No scleral icterus.  Cardiovascular: Normal rate, regular rhythm, normal heart sounds and intact distal pulses. Exam reveals no gallop and no friction rub.  No murmur heard. Pulmonary/Chest: Effort normal and breath sounds normal. No stridor. No respiratory distress. She has no wheezes. She has no rales.  Abdominal: Soft. Bowel sounds are normal. She exhibits no distension and no mass. There is tenderness (generalized, throughout). There is no rebound and no guarding.  Abdominal exam consistent with hard stool throughout.  Musculoskeletal: She exhibits no edema.  Neurological: She is alert and oriented to person, place, and time.  Skin: Skin is warm and dry. No rash noted. No erythema. No pallor.  Psychiatric: She has a normal mood and affect.   ECG interpretation -sinus rhythm at 95 bpm, very comparable to previous ECG from September 2018.  Results for orders placed or performed in visit on 12/27/17 (from the past 24 hour(s))  POCT urinalysis dipstick  Status: None   Collection Time: 12/27/17 12:14 PM  Result Value Ref Range   Color, UA yellow yellow   Clarity, UA clear clear   Glucose, UA negative  negative mg/dL   Bilirubin, UA negative negative   Ketones, POC UA negative negative mg/dL   Spec Grav, UA 1.020 1.010 - 1.025   Blood, UA negative negative   pH, UA 5.0 5.0 - 8.0   Protein Ur, POC negative negative mg/dL   Urobilinogen, UA 0.2 0.2 or 1.0 E.U./dL   Nitrite, UA Negative Negative   Leukocytes, UA Negative Negative    Assessment and Plan :   Atypical chest pain - Plan: EKG 12-Lead  Generalized abdominal pain - Plan: Hemoglobin A1c, Lipid panel, POCT urinalysis dipstick, CANCELED: Comprehensive metabolic panel  Weight gain - Plan: Hemoglobin A1c  Left flank pain - Plan: POCT urinalysis dipstick, Urine Culture  Constipation, unspecified constipation type  Acute left-sided low back pain without sciatica  Neck pain  EKG is very comparable to previous EKG and is reassuring, physical exam findings reassuring and consistent with constipation.  Labs pending.  Counseled on lifestyle modification.  She is to start docusate and use MiraLAX as needed.  We will follow-up with lab results.  Marilyn Eagles, PA-C [facility] [place] [phone] 12/27/2017  11:42 AM

## 2017-12-28 LAB — LIPID PANEL
CHOLESTEROL TOTAL: 150 mg/dL (ref 100–199)
Chol/HDL Ratio: 3.9 ratio (ref 0.0–4.4)
HDL: 38 mg/dL — ABNORMAL LOW (ref >39)
LDL CALC: 62 mg/dL (ref 0–99)
Triglycerides: 248 mg/dL — ABNORMAL HIGH (ref 0–149)
VLDL CHOLESTEROL CAL: 50 mg/dL — AB (ref 5–40)

## 2017-12-28 LAB — HEMOGLOBIN A1C
ESTIMATED AVERAGE GLUCOSE: 134 mg/dL
Hgb A1c MFr Bld: 6.3 % — ABNORMAL HIGH (ref 4.8–5.6)

## 2017-12-29 LAB — URINE CULTURE: Organism ID, Bacteria: NO GROWTH

## 2018-01-11 ENCOUNTER — Telehealth: Payer: Self-pay | Admitting: Urgent Care

## 2018-01-11 NOTE — Telephone Encounter (Signed)
Pt has been using the stool softener prescribed and has been drinking plenty of water.  She states that she is still constipated and her stomach is starting to bloat and hurt. I advised that she should come in and be reevaluated but she cannot afford another office visit right now.  Please advise   [phone]

## 2018-01-15 ENCOUNTER — Ambulatory Visit: Payer: Self-pay | Admitting: *Deleted

## 2018-01-15 NOTE — Telephone Encounter (Signed)
Pt states she has "Calcium balls" along sides of throat and "Under tongue at back of throat". States she was able to "Pick one out of right side of tongue" this afternoon. States feels like the size of an "Acorn" at back of throat. States she has tried to dislodge it and cannot do so. States she has difficulty swallowing and tongue appears swollen. Speech clear, denies SOB. Pt is anxious.  States she forgot to mention these to Dr. Bess Harvest at appt on 12/27/17 but has had in past. Pt directed to ED; states she is near [facility] and she is going there first to see if they can help her.  Reason for Disposition . Symptoms of food or bone stuck in throat or esophagus (e.g., pain in throat or chest, FB sensation, blood-tinged saliva)  Answer Assessment - Initial Assessment Questions 1. SYMPTOM: "Are you having difficulty swallowing liquids, solids, or both?"     Both 2. ONSET: "When did the swallowing problems begin?"      Today at 1400 3. CAUSE: "What do you think is causing the problem?"      "Calcium balls" on left side of throat and under tongue 4. CHRONIC/RECURRENT: "Is this a new problem for you?"  If no, ask: "How long have you had this problem?" (e.g., days, weeks, months)      yes 5. OTHER SYMPTOMS: "Do you have any other symptoms?" (e.g., difficulty breathing, sore throat, swollen tongue, chest pain)     "Looks like my tongue is swollen"  Protocols used: SWALLOWING DIFFICULTY-A-AH

## 2018-01-16 NOTE — Telephone Encounter (Signed)
Please have patient schedule follow up for a recheck. She may need imaging and further work up to r/o other process as a source of her belly pain, constipation.

## 2018-03-01 ENCOUNTER — Ambulatory Visit (INDEPENDENT_AMBULATORY_CARE_PROVIDER_SITE_OTHER): Payer: BC Managed Care – PPO

## 2018-03-01 ENCOUNTER — Ambulatory Visit (INDEPENDENT_AMBULATORY_CARE_PROVIDER_SITE_OTHER): Payer: BC Managed Care – PPO | Admitting: Family Medicine

## 2018-03-01 ENCOUNTER — Other Ambulatory Visit: Payer: Self-pay

## 2018-03-01 ENCOUNTER — Encounter: Payer: Self-pay | Admitting: Family Medicine

## 2018-03-01 VITALS — BP 122/76 | HR 77 | Temp 97.8°F | Resp 17 | Ht 63.0 in | Wt 198.0 lb

## 2018-03-01 DIAGNOSIS — F458 Other somatoform disorders: Secondary | ICD-10-CM

## 2018-03-01 DIAGNOSIS — R0789 Other chest pain: Secondary | ICD-10-CM | POA: Diagnosis not present

## 2018-03-01 DIAGNOSIS — R0989 Other specified symptoms and signs involving the circulatory and respiratory systems: Secondary | ICD-10-CM

## 2018-03-01 DIAGNOSIS — R09A2 Foreign body sensation, throat: Secondary | ICD-10-CM

## 2018-03-01 DIAGNOSIS — J4521 Mild intermittent asthma with (acute) exacerbation: Secondary | ICD-10-CM

## 2018-03-01 DIAGNOSIS — R0602 Shortness of breath: Secondary | ICD-10-CM

## 2018-03-01 DIAGNOSIS — R071 Chest pain on breathing: Secondary | ICD-10-CM

## 2018-03-01 MED ORDER — IPRATROPIUM BROMIDE 0.02 % IN SOLN
0.5000 mg | Freq: Once | RESPIRATORY_TRACT | Status: AC
  Administered 2018-03-01: 0.5 mg via RESPIRATORY_TRACT

## 2018-03-01 MED ORDER — ALBUTEROL SULFATE (2.5 MG/3ML) 0.083% IN NEBU
2.5000 mg | INHALATION_SOLUTION | Freq: Once | RESPIRATORY_TRACT | Status: AC
  Administered 2018-03-01: 2.5 mg via RESPIRATORY_TRACT

## 2018-03-01 MED ORDER — ALBUTEROL SULFATE HFA 108 (90 BASE) MCG/ACT IN AERS: 2.0000 | INHALATION_SPRAY | RESPIRATORY_TRACT | 1 refills | Status: DC | PRN

## 2018-03-01 NOTE — Progress Notes (Signed)
Chief Complaint  Patient presents with  . asthma flare    x 2 days, using inhaler, blood sputum at times and clear drainage, per pt its hard to get up.  Coughing at night hard to sleep and has to sleep sitting up -scared to lie down  . Medication Refill    albuterol inhaler    HPI  Pt reports that starting 2 days ago she developed chest tightness and pressure She states that it feels like it is hard for her to swallow She has some chest pain in the "front of her chest" She denies productive cough She has a history of well controlled asthma and typically uses her inhaler once a year She states that used her inhaler 2 times in a row overnight and once this morning and it did not alleviate her symptoms She denies fevers or chills She denies sick contacts She thought it was indigestion   Past Medical History:  Diagnosis Date  . Allergy   . 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/2007  . ASTHMA 01/30/2007  . Asthma   . Breast tumor    right; benign  . Chest pain 01/24/2017  . GERD 01/31/2007  . Hemorrhoids   . Hiatal hernia   . HTN (hypertension) 09/09/2011  . Hyperlipidemia   . HYPOTHYROIDISM 01/30/2007  . 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    Current Outpatient Medications  Medication Sig Dispense Refill  . albuterol (PROVENTIL HFA;VENTOLIN HFA) 108 (90 Base) MCG/ACT inhaler Inhale 2 puffs into the lungs every 4 (four) hours as needed for wheezing or shortness of breath (cough, shortness of breath or wheezing.). 2 Inhaler 1  . amLODipine (NORVASC) 10 MG tablet TAKE ONE TABLET BY MOUTH DAILY  NEEDS APPOINTMENT 90 tablet 1  . atorvastatin (LIPITOR) 40 MG tablet Take 1 tablet (40 mg total) by mouth daily. 90 tablet 3  . cyclobenzaprine (FLEXERIL) 5 MG tablet Take 1 tablet (5 mg total) by mouth 3 (three) times daily as needed. 90 tablet 1  . docusate sodium (COLACE) 50 MG capsule Take 1 capsule (50 mg total) by mouth 2 (two) times daily. 60 capsule 5  . EPIPEN 2-PAK 0.3 MG/0.3ML SOAJ injection  Inject 0.3 mg as directed as needed for anaphylaxis.    [name] lansoprazole (PREVACID) 30 MG capsule Take 1 capsule (30 mg total) by mouth 2 (two) times daily before a meal. 60 capsule 11  . levothyroxine (SYNTHROID, LEVOTHROID) 100 MCG tablet Take one tablet daily. 90 tablet 1  . meloxicam (MOBIC) 7.5 MG tablet Take 1 tablet (7.5 mg total) by mouth daily. 30 tablet 0  . polyethylene glycol powder (GLYCOLAX/MIRALAX) powder Take 17 g by mouth daily. 500 g 1  . polyvinyl alcohol (LIQUIFILM TEARS) 1.4 % ophthalmic solution Place 1 drop into both eyes at bedtime. 15 mL 0   Current Facility-Administered Medications  Medication Dose Route Frequency Provider Last Rate Last Dose  . albuterol (PROVENTIL) (2.5 MG/3ML) 0.083% nebulizer solution 2.5 mg  2.5 mg Nebulization Once Amarion Portell A, MD      . ipratropium (ATROVENT) nebulizer solution 0.5 mg  0.5 mg Nebulization Once Forrest Moron, MD        Allergies:  Allergies  Allergen Reactions  . Promethazine-Dm   . Tuna [Fish Allergy] Anaphylaxis  . Codeine Nausea And Vomiting  . Flonase [Fluticasone Propionate] Other (See Comments)    Nose bleeds    Past Surgical History:  Procedure Laterality Date  . BREAST LUMPECTOMY Right    benign  . BREAST LUMPECTOMY  WITH RADIOACTIVE SEED LOCALIZATION Right 02/25/2015   Procedure: BREAST LUMPECTOMY WITH RADIOACTIVE SEED LOCALIZATION;  Surgeon: Alphonsa Overall, MD;  Location: [place];  Service: General;  Laterality: Right;  . BREAST REDUCTION SURGERY  1986  . BREAST SURGERY    . broken jaw    . CARPAL TUNNEL RELEASE  2001  . FRACTURE SURGERY    . REDUCTION MAMMAPLASTY Bilateral   . THYROIDECTOMY    . TUBAL LIGATION  1987    Social History   Socioeconomic History  . Marital status: Single    Spouse name: Not on file  . Number of children: 3  . Years of education: Not on file  . Highest education level: Not on file  Occupational History  . 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 Financial resource strain: Not on file  . Food insecurity:    Worry: Not on file    Inability: Not on file  . Transportation needs:    Medical: Not on file    Non-medical: Not on file  Tobacco Use  . Smoking status: Former Smoker    Packs/day: 0.25    Years: 3.00    Pack years: 0.75    Types: Cigarettes    Last attempt to quit: 06/04/1980    Years since quitting: 37.7  . Smokeless tobacco: Never Used  Substance and Sexual Activity  . Alcohol use: No    Alcohol/week: 0.0 standard drinks  . Drug use: No  . Sexual activity: Not on file  Lifestyle  . Physical activity:    Days per week: Not on file    Minutes per session: Not on file  . Stress: Not on file  Relationships  . Social connections:    Talks on phone: Not on file    Gets together: Not on file    Attends religious service: Not on file    Active member of club or organization: Not on file    Attends meetings of clubs or organizations: Not on file    Relationship status: Not on file  Other Topics Concern  . Not on file  Social History Narrative   Married    Family History  Problem Relation Age of Onset  . Hypertension Father   . Diabetes Father   . Hyperlipidemia Father   . Asthma Mother   . Breast cancer Sister   . Thyroid disease Sister   . Diabetes Maternal Grandmother   . Heart disease Maternal Grandmother   . Heart attack Maternal Grandmother 60  . Asthma Maternal Grandfather   . Heart attack Maternal Grandfather   . Colon cancer Neg Hx      ROS Review of Systems See HPI Constitution: No fevers or chills No malaise No diaphoresis Skin: No rash or itching Eyes: no blurry vision, no double vision GU: no dysuria or hematuria Neuro: no dizziness or headaches all others reviewed and negative   Objective: Vitals:   03/01/18 0919  BP: 122/76  Pulse: 77  Resp: 17  Temp: 97.8 F (36.6 C)  TempSrc: Oral  SpO2: 100%  Weight: 198 lb (89.8 kg)  Height: 5'  3" (1.6 m)    Physical Exam   General: alert, oriented, in NAD Head: normocephalic, atraumatic, no sinus tenderness Eyes: EOM intact, no scleral icterus or conjunctival injection Ears: TM clear bilaterally Nose: mucosa nonerythematous, nonedematous Throat: no pharyngeal exudate or erythema Lymph: no posterior auricular, submental or cervical lymph adenopathy Heart:  normal rate, normal sinus rhythm, no murmurs Lungs: clear to auscultation bilaterally, no wheezing    EKG REVIEW NSR, no twi, no st elevation  CLINICAL DATA:  Chest pain  EXAM: CHEST - 2 VIEW  COMPARISON:  03/25/2017  FINDINGS: Normal heart size. New linear opacity in the lateral right upper lobe. It may be artifact. Lungs are grossly clear. No pneumothorax. No pleural effusion.  IMPRESSION: No active cardiopulmonary disease.   Electronically Signed   By: Marybelle Killings M.D.   On: 03/01/2018 09:54  Assessment and Plan Shaletta was seen today for asthma flare and medication refill.  Diagnoses and all orders for this visit:  Chest pain on breathing Chest tightness No pneumonia, no changes on ECG Improved after duoneb Follow up in 3 days Pt having asthma attack -     EKG 12-Lead -     DG Chest 2 View; Future -     albuterol (PROVENTIL) (2.5 MG/3ML) 0.083% nebulizer solution 2.5 mg -     ipratropium (ATROVENT) nebulizer solution 0.5 mg  Mild intermittent asthma with acute exacerbation  Shortness of breath -     albuterol (PROVENTIL HFA;VENTOLIN HFA) 108 (90 Base) MCG/ACT inhaler; Inhale 2 puffs into the lungs every 4 (four) hours as needed for wheezing or shortness of breath (cough, shortness of breath or wheezing.).  Globus pharyngeus- continue prevacid, my added tums, pepcid or zantac for 3 days to provide relief    Reassessment after nebulizer S: pt felt better after nebulizer and felt less chest pressure  Lungs: CTAB without wheezing   Continue current albuterol every 4-6 hrs   Pt has  appt Monday 03/03/18 at 8am  Will reassess at that time  [place]

## 2018-03-01 NOTE — Patient Instructions (Signed)
° ° ° °  If you have lab work done today you will be contacted with your lab results within the next 2 weeks.  If you have not heard from us then please contact us. The fastest way to get your results is to register for My Chart. ° ° °IF you received an x-ray today, you will receive an invoice from [facility]. Please contact [facility] at [phone] with questions or concerns regarding your invoice.  ° °IF you received labwork today, you will receive an invoice from [facility]. Please contact [facility] at [phone] with questions or concerns regarding your invoice.  ° °Our billing staff will not be able to assist you with questions regarding bills from these companies. ° °You will be contacted with the lab results as soon as they are available. The fastest way to get your results is to activate your My Chart account. Instructions are located on the last page of this paperwork. If you have not heard from us regarding the results in 2 weeks, please contact this office. °  ° ° ° °

## 2018-03-02 NOTE — Progress Notes (Signed)
Chief Complaint  Patient presents with  . Annual Exam    cpe w/o pap    Subjective:  Marilyn [name] is a 52 y.o. female here for a health maintenance visit.  Patient is establish pt  Pt has difficulty swallowing and feels like she is choking. 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 She was told that her thyroid is coming back. She cannot feel it but it feels like something is stuck in her throat on the left side of her neck. She still gets choking sensation. She has shortness of breath from her asthma. Her asthma has improved since resuming albuterol.    Patient Active Problem List   Diagnosis Date Noted  . Urinary retention 08/05/2017  . Atypical chest pain 01/23/2017  . Nausea & vomiting 07/16/2015  . RUQ abdominal pain 07/16/2015  . RLQ abdominal pain 07/15/2015  . Pain in lower back 11/30/2014  . Acute upper respiratory infection 05/05/2014  . Allergic rhinitis 05/05/2014  . Hoarseness 05/05/2014  . Dysphagia 05/05/2014  . Obesity 04/11/2014  . Edema, peripheral 04/11/2014  . Cough 12/20/2012  . Hypersomnolence 12/15/2012  . DOE (dyspnea on exertion) 12/15/2012  . Right shoulder pain 12/15/2012  . Abdominal pain 11/15/2011  . HTN (hypertension) 09/09/2011  . Neck pain 09/09/2011  . Preventative health care 04/30/2011  . HOT FLASHES 12/22/2009  . CHEST PAIN 01/29/2009  . Acute non-recurrent maxillary sinusitis 09/25/2008  . HYPERTHYROIDISM 01/31/2007  . GERD 01/31/2007  . Hypothyroidism 01/30/2007  . Hyperlipidemia 01/30/2007  . Iron deficiency anemia 01/30/2007  . ASTHMA 01/30/2007    Past Medical History:  Diagnosis Date  . Allergy   . 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/2007  . ASTHMA 01/30/2007  . Asthma   . Breast tumor    right; benign  . Chest pain 01/24/2017  . GERD 01/31/2007  . Hemorrhoids   . Hiatal hernia   . HTN (hypertension) 09/09/2011  . Hyperlipidemia   . HYPOTHYROIDISM 01/30/2007  . Mallory Mariel Kansky tear 04/2003    Past Surgical  History:  Procedure Laterality Date  . BREAST LUMPECTOMY Right    benign  . BREAST LUMPECTOMY WITH RADIOACTIVE SEED LOCALIZATION Right 02/25/2015   Procedure: BREAST LUMPECTOMY WITH RADIOACTIVE SEED LOCALIZATION;  Surgeon: Alphonsa Overall, MD;  Location: [facility];  Service: General;  Laterality: Right;  . BREAST REDUCTION SURGERY  1986  . BREAST SURGERY    . broken jaw    . CARPAL TUNNEL RELEASE  2001  . FRACTURE SURGERY    . REDUCTION MAMMAPLASTY Bilateral   . THYROIDECTOMY    . TUBAL LIGATION  1987     Outpatient Medications Prior to Visit  Medication Sig Dispense Refill  . albuterol (PROVENTIL HFA;VENTOLIN HFA) 108 (90 Base) MCG/ACT inhaler Inhale 2 puffs into the lungs every 4 (four) hours as needed for wheezing or shortness of breath (cough, shortness of breath or wheezing.). 2 Inhaler 1  . amLODipine (NORVASC) 10 MG tablet TAKE ONE TABLET BY MOUTH DAILY  NEEDS APPOINTMENT 90 tablet 1  . atorvastatin (LIPITOR) 40 MG tablet Take 1 tablet (40 mg total) by mouth daily. 90 tablet 3  . cyclobenzaprine (FLEXERIL) 5 MG tablet Take 1 tablet (5 mg total) by mouth 3 (three) times daily as needed. 90 tablet 1  . docusate sodium (COLACE) 50 MG capsule Take 1 capsule (50 mg total) by mouth 2 (two) times daily. 60 capsule 5  . EPIPEN 2-PAK 0.3 MG/0.3ML SOAJ injection Inject 0.3 mg as  directed as needed for anaphylaxis.    [name] lansoprazole (PREVACID) 30 MG capsule Take 1 capsule (30 mg total) by mouth 2 (two) times daily before a meal. 60 capsule 11  . levothyroxine (SYNTHROID, LEVOTHROID) 100 MCG tablet Take one tablet daily. 90 tablet 1  . meloxicam (MOBIC) 7.5 MG tablet Take 1 tablet (7.5 mg total) by mouth daily. 30 tablet 0  . polyethylene glycol powder (GLYCOLAX/MIRALAX) powder Take 17 g by mouth daily. 500 g 1  . polyvinyl alcohol (LIQUIFILM TEARS) 1.4 % ophthalmic solution Place 1 drop into both eyes at bedtime. 15 mL 0   No facility-administered medications prior to visit.      Allergies  Allergen Reactions  . Promethazine-Dm   . Tuna [Fish Allergy] Anaphylaxis  . Codeine Nausea And Vomiting  . Flonase [Fluticasone Propionate] Other (See Comments)    Nose bleeds     Family History  Problem Relation Age of Onset  . Hypertension Father   . Diabetes Father   . Hyperlipidemia Father   . Asthma Mother   . Breast cancer Sister   . Thyroid disease Sister   . Diabetes Maternal Grandmother   . Heart disease Maternal Grandmother   . Heart attack Maternal Grandmother 60  . Asthma Maternal Grandfather   . Heart attack Maternal Grandfather   . Colon cancer Neg Hx      Health Habits: Dental Exam: up to date Eye Exam: up to date Exercise: 0 times/week on average Current exercise activities: walking/running Diet: balanced   Social History   Socioeconomic History  . Marital status: Single    Spouse name: Not on file  . Number of children: 3  . Years of education: Not on file  . Highest education level: Not on file  Occupational History  . 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    Employer: [employer]  Social Needs  . Financial resource strain: Not on file  . Food insecurity:    Worry: Not on file    Inability: Not on file  . Transportation needs:    Medical: Not on file    Non-medical: Not on file  Tobacco Use  . Smoking status: Former Smoker    Packs/day: 0.25    Years: 3.00    Pack years: 0.75    Types: Cigarettes    Last attempt to quit: 06/04/1980    Years since quitting: 37.7  . Smokeless tobacco: Never Used  Substance and Sexual Activity  . Alcohol use: No    Alcohol/week: 0.0 standard drinks  . Drug use: No  . Sexual activity: Not on file  Lifestyle  . Physical activity:    Days per week: Not on file    Minutes per session: Not on file  . Stress: Not on file  Relationships  . Social connections:    Talks on phone: Not on file    Gets together: Not on file    Attends religious service: Not on file    Active member of club or  organization: Not on file    Attends meetings of clubs or organizations: Not on file    Relationship status: Not on file  . Intimate partner violence:    Fear of current or ex partner: Not on file    Emotionally abused: Not on file    Physically abused: Not on file    Forced sexual activity: Not on file  Other Topics Concern  . Not on file  Social History Narrative   Married  Social History   Substance and Sexual Activity  Alcohol Use No  . Alcohol/week: 0.0 standard drinks   Social History   Tobacco Use  Smoking Status Former Smoker  . Packs/day: 0.25  . Years: 3.00  . Pack years: 0.75  . Types: Cigarettes  . Last attempt to quit: 06/04/1980  . Years since quitting: 37.7  Smokeless Tobacco Never Used   Social History   Substance and Sexual Activity  Drug Use No    GYN: Sexual Health Menstrual status: regular menses LMP: Patient's last menstrual period was 03/18/2014. Last pap smear: see HM section History of abnormal pap smears:  Sexually active: with female partner Current contraception: postmenopausal  Health Maintenance: See under health Maintenance activity for review of completion dates as well. Immunization History  Administered Date(s) Administered  . Influenza Split 04/30/2011  . Influenza Whole 03/04/2012  . Influenza,inj,Quad PF,6+ Mos 04/01/2014, 07/20/2015, 02/20/2017  . Influenza-Unspecified 02/02/2014  . PPD Test 03/25/2017  . Pneumococcal Polysaccharide-23 04/01/2014  . Td 06/04/1997, 12/22/2009      Depression Screen-PHQ2/9 Depression screen [facility] 2/9 03/03/2018 03/01/2018 12/27/2017 11/05/2017 09/28/2017  Decreased Interest 0 0 0 0 0  Down, Depressed, Hopeless 0 0 0 0 0  PHQ - 2 Score 0 0 0 0 0  Some recent data might be hidden       Depression Severity and Treatment Recommendations:  0-4= None  5-9= Mild / Treatment: Support, educate to call if worse; return in one month  10-14= Moderate / Treatment: Support, watchful waiting;  Antidepressant or Psycotherapy  15-19= Moderately severe / Treatment: Antidepressant OR Psychotherapy  >= 20 = Major depression, severe / Antidepressant AND Psychotherapy    Review of Systems   Review of Systems  Constitutional: Negative for chills and fever.  HENT: Negative for congestion and sinus pain.   Respiratory:       Sensation of something stuck in her throat  Cardiovascular: Negative for chest pain and palpitations.  Genitourinary: Negative for dysuria, frequency and urgency.  Musculoskeletal: Negative for back pain, joint pain and neck pain.  Skin: Negative for itching and rash.  Neurological: Negative for dizziness, tingling and headaches.  Psychiatric/Behavioral: Negative for depression. The patient is not nervous/anxious and does not have insomnia.     See HPI for ROS as well.    Objective:   Vitals:   03/03/18 0807  BP: 124/81  Pulse: 82  Resp: 17  Temp: 98.3 F (36.8 C)  TempSrc: Oral  SpO2: 98%  Weight: 200 lb 3.2 oz (90.8 kg)  Height: 5\' 3"  (1.6 m)    Body mass index is 35.46 kg/m.  Physical Exam  Constitutional: She is oriented to person, place, and time. She appears well-developed and well-nourished.  HENT:  Head: Normocephalic and atraumatic.  Right Ear: External ear normal.  Left Ear: External ear normal.  Mouth/Throat: Oropharynx is clear and moist.  Eyes: Conjunctivae and EOM are normal.  Neck: Normal range of motion. Neck supple. No thyromegaly present.  No palpable thyroid  Cardiovascular: Normal rate, regular rhythm and normal heart sounds.  No murmur heard. Pulmonary/Chest: Effort normal and breath sounds normal. No stridor. No respiratory distress.  Musculoskeletal: Normal range of motion. She exhibits no edema.  Neurological: She is alert and oriented to person, place, and time.  Skin: Skin is warm. Capillary refill takes less than 2 seconds.  Psychiatric: She has a normal mood and affect. Her behavior is normal. Judgment and  thought content normal.  Assessment/Plan:   Patient was seen for a health maintenance exam.  Counseled the patient on health maintenance issues. Reviewed her health mainteance schedule and ordered appropriate tests (see orders.) Counseled on regular exercise and weight management. Recommend regular eye exams and dental cleaning.   The following issues were addressed today for health maintenance:   Kenzly was seen today for annual exam.  Diagnoses and all orders for this visit:  Encounter for health maintenance examination-  Women's Health Maintenance Plan Advised monthly breast exam and annual mammogram Advised dental exam every six months Discussed stress management Discussed pap smear screening guidelines   History of total thyroidectomy -     Ambulatory referral to ENT  Globus pharyngeus -     Ambulatory referral to ENT  History of goiter -     Ambulatory referral to ENT  Will refer to ENT for evaluation for concern for laryngeal GERD vs. Residual growth from the parathyroid or thyroid Continue albuterol every 6 hours     Return in about 3 months (around 06/02/2018) for a1c recheck, thyroid recheck.    Body mass index is 35.46 kg/m.:  Discussed the patient's BMI with patient. The BMI body mass index is 35.46 kg/m.     No future appointments.  Patient Instructions       If you have lab work done today you will be contacted with your lab results within the next 2 weeks.  If you have not heard from [place] then please contact us. The fastest way to get your results is to register for My Chart.   IF you received an x-ray today, you will receive an invoice from [facility]. Please contact [facility] at [phone] with questions or concerns regarding your invoice.   IF you received labwork today, you will receive an invoice from [facility]. Please contact [facility] at [phone] with questions or concerns regarding your invoice.   Our  billing staff will not be able to assist you with questions regarding bills from these companies.  You will be contacted with the lab results as soon as they are available. The fastest way to get your results is to activate your My Chart account. Instructions are located on the last page of this paperwork. If you have not heard from [place] regarding the results in 2 weeks, please contact this office.

## 2018-03-03 ENCOUNTER — Encounter: Payer: Self-pay | Admitting: Family Medicine

## 2018-03-03 ENCOUNTER — Ambulatory Visit (INDEPENDENT_AMBULATORY_CARE_PROVIDER_SITE_OTHER): Payer: BC Managed Care – PPO | Admitting: Family Medicine

## 2018-03-03 ENCOUNTER — Other Ambulatory Visit: Payer: Self-pay

## 2018-03-03 VITALS — BP 124/81 | HR 82 | Temp 98.3°F | Resp 17 | Ht 63.0 in | Wt 200.2 lb

## 2018-03-03 DIAGNOSIS — Z0001 Encounter for general adult medical examination with abnormal findings: Secondary | ICD-10-CM

## 2018-03-03 DIAGNOSIS — Z8639 Personal history of other endocrine, nutritional and metabolic disease: Secondary | ICD-10-CM | POA: Diagnosis not present

## 2018-03-03 DIAGNOSIS — R0989 Other specified symptoms and signs involving the circulatory and respiratory systems: Secondary | ICD-10-CM

## 2018-03-03 DIAGNOSIS — E890 Postprocedural hypothyroidism: Secondary | ICD-10-CM | POA: Diagnosis not present

## 2018-03-03 DIAGNOSIS — F458 Other somatoform disorders: Secondary | ICD-10-CM | POA: Diagnosis not present

## 2018-03-03 DIAGNOSIS — Z0000 Encounter for general adult medical examination without abnormal findings: Secondary | ICD-10-CM

## 2018-03-03 NOTE — Patient Instructions (Addendum)
   If you have lab work done today you will be contacted with your lab results within the next 2 weeks.  If you have not heard from us then please contact us. The fastest way to get your results is to register for My Chart.   IF you received an x-ray today, you will receive an invoice from [facility]. Please contact [facility] at [phone] with questions or concerns regarding your invoice.   IF you received labwork today, you will receive an invoice from [facility]. Please contact [facility] at [phone] with questions or concerns regarding your invoice.   Our billing staff will not be able to assist you with questions regarding bills from these companies.  You will be contacted with the lab results as soon as they are available. The fastest way to get your results is to activate your My Chart account. Instructions are located on the last page of this paperwork. If you have not heard from us regarding the results in 2 weeks, please contact this office.      Preventing Unhealthy Weight Gain, Adult Staying at a healthy weight is important. When fat builds up in your body, you may become overweight or obese. These conditions put you at greater risk for developing certain health problems, such as heart disease, diabetes, sleeping problems, joint problems, and some cancers. Unhealthy weight gain is often the result of making unhealthy choices in what you eat. It is also a result of not getting enough exercise. You can make changes to your lifestyle to prevent obesity and stay as healthy as possible. What nutrition changes can be made? To maintain a healthy weight and prevent obesity:  Eat only as much as your body needs. To do this: ? Pay attention to signs that you are hungry or full. Stop eating as soon as you feel full. ? If you feel hungry, try drinking water first. Drink enough water so your urine is clear or pale yellow. ? Eat smaller portions. ? Look at serving  sizes on food labels. Most foods contain more than one serving per container. ? Eat the recommended amount of calories for your gender and activity level. While most active people should eat around 2,000 calories per day, if you are trying to lose weight or are not very active, you main need to eat less calories. Talk to your health care provider or dietitian about how many calories you should eat each day.  Choose healthy foods, such as: ? Fruits and vegetables. Try to fill at least half of your plate at each meal with fruits and vegetables. ? Whole grains, such as whole wheat bread, brown rice, and quinoa. ? Lean meats, such as chicken or fish. ? Other healthy proteins, such as beans, eggs, or tofu. ? Healthy fats, such as nuts, seeds, fatty fish, and olive oil. ? Low-fat or fat-free dairy.  Check food labels and avoid food and drinks that: ? Are high in calories. ? Have added sugar. ? Are high in sodium. ? Have saturated fats or trans fats.  Limit how much you eat of the following foods: ? Prepackaged meals. ? Fast food. ? Fried foods. ? Processed meat, such as bacon, sausage, and deli meats. ? Fatty cuts of red meat and poultry with skin.  Cook foods in healthier ways, such as by baking, broiling, or grilling.  When grocery shopping, try to shop around the outside of the store. This helps you buy mostly fresh foods and avoid canned and   prepackaged foods.  What lifestyle changes can be made?  Exercise at least 30 minutes 5 or more days each week. Exercising includes brisk walking, yard work, biking, running, swimming, and team sports like basketball and soccer. Ask your health care provider which exercises are safe for you.  Do not use any products that contain nicotine or tobacco, such as cigarettes and e-cigarettes. If you need help quitting, ask your health care provider.  Limit alcohol intake to no more than 1 drink a day for nonpregnant women and 2 drinks a day for men. One  drink equals 12 oz of beer, 5 oz of wine, or 1 oz of hard liquor.  Try to get 7-9 hours of sleep each night. What other changes can be made?  Keep a food and activity journal to keep track of: ? What you ate and how many calories you had. Remember to count sauces, dressings, and side dishes. ? Whether you were active, and what exercises you did. ? Your calorie, weight, and activity goals.  Check your weight regularly. Track any changes. If you notice you have gained weight, make changes to your diet or activity routine.  Avoid taking weight-loss medicines or supplements. Talk to your health care provider before starting any new medicine or supplement.  Talk to your health care provider before trying any new diet or exercise plan. Why are these changes important? Eating healthy, staying active, and having healthy habits not only help prevent obesity, they also:  Help you to manage stress and emotions.  Help you to connect with friends and family.  Improve your self-esteem.  Improve your sleep.  Prevent long-term health problems.  What can happen if changes are not made? Being obese or overweight can cause you to develop joint or bone problems, which can make it hard for you to stay active or do activities you enjoy. Being obese or overweight also puts stress on your heart and lungs and can lead to health problems like diabetes, heart disease, and some cancers. Where to find more information: Talk with your health care provider or a dietitian about healthy eating and healthy lifestyle choices. You may also find other information through these resources:  U.S. Department of Agriculture MyPlate: www.choosemyplate.gov  American Heart Association: www.heart.org  Centers for Disease Control and Prevention: www.cdc.gov  Summary  Staying at a healthy weight is important. It helps prevent certain diseases and health problems, such as heart disease, diabetes, joint problems, sleep  disorders, and some cancers.  Being obese or overweight can cause you to develop joint or bone problems, which can make it hard for you to stay active or do activities you enjoy.  You can prevent unhealthy weight gain by eating a healthy diet, exercising regularly, not smoking, limiting alcohol, and getting enough sleep.  Talk with your health care provider or a dietitian for guidance about healthy eating and healthy lifestyle choices. This information is not intended to replace advice given to you by your health care provider. Make sure you discuss any questions you have with your health care provider. Document Released: 05/22/2016 Document Revised: 06/27/2016 Document Reviewed: 06/27/2016 Elsevier Interactive Patient Education  2018 Elsevier Inc.  

## 2018-03-07 ENCOUNTER — Telehealth: Payer: Self-pay

## 2018-03-07 ENCOUNTER — Telehealth: Payer: Self-pay | Admitting: Family Medicine

## 2018-03-07 NOTE — Telephone Encounter (Signed)
Pt calling back to check status. Pt has no voice. Please advise pt would like medication called into [facility] on [address].

## 2018-03-07 NOTE — Telephone Encounter (Signed)
Copied from [place] [id number]. Topic: General - Other >> Mar 06, 2018 11:21 AM Leward Quan A wrote: Reason for CRM: Patient called to request Rx for cough medicine sent to her pharmacy. Asked to get call back from Dr Nolon Rod nurse also to discuss a referral did not give any details. Only say its imperative to receive a call back. Please advise. Ph# [phone]

## 2018-03-07 NOTE — Telephone Encounter (Signed)
Pt called to say that she will be seeking care elsewhere for her medical needs. No reason given

## 2018-03-07 NOTE — Telephone Encounter (Signed)
Dr Laban Emperor.

## 2018-03-09 NOTE — Telephone Encounter (Signed)
Please cancel her appointment and remove me as the pcp.  Thank you.

## 2018-03-11 NOTE — Telephone Encounter (Signed)
Pt has canceled appointment and has removed Dr. Kaleen Mask as her PCP

## 2018-03-27 NOTE — Telephone Encounter (Signed)
appt cancelled and stallings removed as pcp. Dgaddy, CMA

## 2018-04-04 ENCOUNTER — Telehealth: Payer: Self-pay | Admitting: Family Medicine

## 2018-04-04 NOTE — Telephone Encounter (Signed)
Copied from [facility] [id number]. Topic: Quick Communication - See Telephone Encounter >> Apr 04, 2018 12:05 PM Marilyn [name] wrote: CRM for notification. See Telephone encounter for: 04/04/18.  Patient states that the pharmacy faxed a request on Saturday 10/26 for refill on amLODipine (NORVASC) 10 MG tablet, the pharmacy gave her 5 pills to get her by until the office approved the request. Patient still has not heard back and is now out of the medication.  [facility] [address], [place] [address]

## 2018-04-07 ENCOUNTER — Telehealth: Payer: Self-pay | Admitting: Gastroenterology

## 2018-04-07 ENCOUNTER — Other Ambulatory Visit: Payer: Self-pay | Admitting: *Deleted

## 2018-04-07 DIAGNOSIS — I10 Essential (primary) hypertension: Secondary | ICD-10-CM

## 2018-04-07 MED ORDER — AMLODIPINE BESYLATE 10 MG PO TABS: ORAL_TABLET | ORAL | 0 refills | Status: DC

## 2018-04-07 NOTE — Telephone Encounter (Signed)
Left message for patient to call back that she will need an office visit for eval.  I asked that she call back and we will set her up to see APP this week

## 2018-04-08 ENCOUNTER — Telehealth: Payer: Self-pay | Admitting: *Deleted

## 2018-04-08 NOTE — Telephone Encounter (Signed)
LMOM rx sent to pharmacy. 

## 2018-04-08 NOTE — Telephone Encounter (Signed)
Pt advise rx is ready for pickup at pharmacy.

## 2018-04-09 ENCOUNTER — Telehealth: Payer: Self-pay | Admitting: Gastroenterology

## 2018-04-09 ENCOUNTER — Encounter: Payer: Self-pay | Admitting: Gastroenterology

## 2018-04-09 ENCOUNTER — Ambulatory Visit (INDEPENDENT_AMBULATORY_CARE_PROVIDER_SITE_OTHER): Payer: BC Managed Care – PPO | Admitting: Gastroenterology

## 2018-04-09 VITALS — BP 110/74 | HR 96 | Ht 61.5 in | Wt 200.2 lb

## 2018-04-09 DIAGNOSIS — R221 Localized swelling, mass and lump, neck: Secondary | ICD-10-CM

## 2018-04-09 DIAGNOSIS — R1310 Dysphagia, unspecified: Secondary | ICD-10-CM

## 2018-04-09 DIAGNOSIS — K219 Gastro-esophageal reflux disease without esophagitis: Secondary | ICD-10-CM | POA: Diagnosis not present

## 2018-04-09 MED ORDER — PANTOPRAZOLE SODIUM 40 MG PO TBEC: 40.0000 mg | DELAYED_RELEASE_TABLET | Freq: Two times a day (BID) | ORAL | 5 refills | Status: DC

## 2018-04-09 MED ORDER — SUCRALFATE 1 GM/10ML PO SUSP: 1.0000 g | Freq: Three times a day (TID) | ORAL | 1 refills | Status: DC

## 2018-04-09 NOTE — Telephone Encounter (Signed)
Pt called to inform that carafte is too expensive and is asking if she can have something more affordable.

## 2018-04-09 NOTE — Patient Instructions (Addendum)
You have been scheduled for an endoscopy. Please follow written instructions given to you at your visit today. If you use inhalers (even only as needed), please bring them with you on the day of your procedure.   We have sent the following medications to your pharmacy for you to pick up at your convenience: Pantoprazole and sucralfate suspension   STOP YOUR PREVACID.   You have been scheduled for a CT scan of the neck at [facility] ([address]---this is in the same building as [facility]).   You are scheduled on 04/22/18 at 4:00pm. You should arrive 15 minutes prior to your appointment time for registration. Please follow the written instructions below on the day of your exam:  WARNING: IF YOU ARE ALLERGIC TO IODINE/X-RAY DYE, PLEASE NOTIFY RADIOLOGY IMMEDIATELY AT [phone]! YOU WILL BE GIVEN A 13 HOUR PREMEDICATION PREP.  1) Do not eat anything after 2:00pm (2 hours prior to your test)  If you have any questions regarding your exam or if you need to reschedule, you may call the CT department at [phone] between the hours of 8:00 am and 5:00 pm, Monday-Friday.  ________________________________________________________________________  Benjiman Core is the endocrinologist name you asked about, phone # [phone].    I appreciate the opportunity to care for you.

## 2018-04-09 NOTE — Progress Notes (Signed)
04/09/2018 Marilyn [name] [id number] Aug 29, 1965   HISTORY OF PRESENT ILLNESS:  This is a pleasant, but extremely anxious 52 year old female who is a patient of Dr. Lynne Leader.  She was seen here in 2016 with complaints of GERD and some dysphasia/globus sensation.  Underwent EGD in 04/2015 that was normal except for a small hiatal hernia.  Was dilated with 16 mm Savary dilator for complaints of dysphagia and globus sensation.  She subsequently had an esophagram that showed a very small hiatal hernia and also was evaluated with modified barium swallow study with speech and was found to have normal oropharyngeal swallowing, no major issues at that time.  She presents here today with ongoing and similar complaints as to what she had 3 years ago.  She feels that her symptoms have just worsened over the past 3 years.  She complains of problems swallowing pills, saying they get stuck in her throat, but also describes issues with swallowing liquids and with food getting stuck as well.  She complains of a sensation of swelling on the left side of her neck and that the left side of her tongue feels thick.  She also complains of severe reflux despite her Prevacid 30 mg twice daily.  Says that she has been using Pepcid, Tums, papaya, vinegar water, baking soda water, etc. and all those things are only very temporary until symptoms returned again.  She is very anxious and asking for several different tests to be performed.  States that she is afraid to eat or drink because of her symptoms.   Past Medical History:  Diagnosis Date  . Allergy   . 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/2007  . ASTHMA 01/30/2007  . Asthma   . Breast tumor    right; benign  . Chest pain 01/24/2017  . GERD 01/31/2007  . Hemorrhoids   . Hiatal hernia   . HTN (hypertension) 09/09/2011  . Hyperlipidemia   . HYPOTHYROIDISM 01/30/2007  . Mallory Mariel Kansky tear 04/2003   Past Surgical History:  Procedure Laterality Date  . BREAST LUMPECTOMY  Right    benign  . BREAST LUMPECTOMY WITH RADIOACTIVE SEED LOCALIZATION Right 02/25/2015   Procedure: BREAST LUMPECTOMY WITH RADIOACTIVE SEED LOCALIZATION;  Surgeon: Alphonsa Overall, MD;  Location: [facility];  Service: General;  Laterality: Right;  . BREAST REDUCTION SURGERY  1986  . BREAST SURGERY    . broken jaw    . CARPAL TUNNEL RELEASE  2001  . FRACTURE SURGERY    . REDUCTION MAMMAPLASTY Bilateral   . THYROIDECTOMY    . TUBAL LIGATION  1987    reports that she quit smoking about 37 years ago. Her smoking use included cigarettes. She has a 0.75 pack-year smoking history. She has never used smokeless tobacco. She reports that she does not drink alcohol or use drugs. family history includes Asthma in her maternal grandfather and mother; Breast cancer in her sister; Diabetes in her father and maternal grandmother; Heart attack in her maternal grandfather; Heart attack (age of onset: 30) in her maternal grandmother; Heart disease in her maternal grandmother; Hyperlipidemia in her father; Hypertension in her father; Thyroid disease in her sister. Allergies  Allergen Reactions  . Promethazine-Dm   . Tuna [Fish Allergy] Anaphylaxis  . Codeine Nausea And Vomiting  . Flonase [Fluticasone Propionate] Other (See Comments)    Nose bleeds      Outpatient Encounter Medications as of 04/09/2018  Medication Sig  . albuterol (PROVENTIL HFA;VENTOLIN HFA) 108 (90  Base) MCG/ACT inhaler Inhale 2 puffs into the lungs every 4 (four) hours as needed for wheezing or shortness of breath (cough, shortness of breath or wheezing.).  [name] amLODipine (NORVASC) 10 MG tablet TAKE ONE TABLET BY MOUTH DAILY  NEEDS APPOINTMENT  . atorvastatin (LIPITOR) 40 MG tablet Take 1 tablet (40 mg total) by mouth daily.  . cyclobenzaprine (FLEXERIL) 5 MG tablet Take 1 tablet (5 mg total) by mouth 3 (three) times daily as needed.  . lansoprazole (PREVACID) 30 MG capsule Take 1 capsule (30 mg total) by mouth 2 (two) times  daily before a meal.  . levothyroxine (SYNTHROID, LEVOTHROID) 100 MCG tablet Take one tablet daily.  . meloxicam (MOBIC) 7.5 MG tablet Take 1 tablet (7.5 mg total) by mouth daily.  . polyethylene glycol powder (GLYCOLAX/MIRALAX) powder Take 17 g by mouth daily.  . polyvinyl alcohol (LIQUIFILM TEARS) 1.4 % ophthalmic solution Place 1 drop into both eyes at bedtime.  . cephALEXin (KEFLEX) 250 MG/5ML suspension Take 10 mLs by mouth every 6 (six) hours. For 10 days  . EPIPEN 2-PAK 0.3 MG/0.3ML SOAJ injection Inject 0.3 mg as directed as needed for anaphylaxis.  . naproxen (NAPROSYN) 500 MG tablet Take 1 tablet by mouth 2 (two) times daily. For 15 days  . [DISCONTINUED] docusate sodium (COLACE) 50 MG capsule Take 1 capsule (50 mg total) by mouth 2 (two) times daily.   No facility-administered encounter medications on file as of 04/09/2018.      REVIEW OF SYSTEMS  : All other systems reviewed and negative except where noted in the History of Present Illness.   PHYSICAL EXAM: BP 110/74 (BP Location: Left Arm, Patient Position: Sitting, Cuff Size: Normal)   Pulse 96   Ht 5' 1.5" (1.562 m) Comment: height measured without shoes  Wt 200 lb 4 oz (90.8 kg)   LMP 03/18/2014   BMI 37.22 kg/m  General: Well developed black female in no acute distress Head: Normocephalic and atraumatic Eyes:  Sclerae anicteric, conjunctiva pink. Ears: Normal auditory acuity Neck:  No lymphadenopathy noted.  No swelling appreciated by my exam. Lungs: Clear throughout to auscultation; no increased WOB. Heart: Regular rate and rhythm; no M/R/G. Abdomen: Soft, non-distended.  BS present.  Non-tender. Musculoskeletal: Symmetrical with no gross deformities  Skin: No lesions on visible extremities Extremities: No edema  Neurological: Alert oriented x 4, grossly non-focal Psychological:  Alert and cooperative. Normal mood and affect  ASSESSMENT AND PLAN: *52 year old female with complaints of dysphagia to pills,  liquids, and solid foods.  Severe GERD not controlled by her current PPI.  Also complains of sensation of swelling in her neck.  She is very anxious and wanting several different tests performed.  Had evaluation of some of the same symptoms in 2016, which was all fairly unremarkable.  Will plan for repeat EGD with possible dilation with Dr. Fuller Plan.  Will discontinue her prevacid and start pantoprazole 40 mg BID along with carafate suspension ACHS.  I will order a CT scan of her neck as well to rule out extra-esophageal source of symptoms.  She is also wanting MBSS repeated, which was unremarkable 3 years ago.  May need to repeat but will await results of these other studies first.  **The risks, benefits, and alternatives to EGD with possible dilation were discussed with the patient and she consents to proceed.   CC:  No ref. provider found

## 2018-04-10 MED ORDER — SUCRALFATE 1 G PO TABS: 1.0000 g | ORAL_TABLET | Freq: Three times a day (TID) | ORAL | 2 refills | Status: DC

## 2018-04-10 NOTE — Progress Notes (Signed)
Reviewed and agree with initial management plan. In addition to the evaluation outlined please proceed with a barium esophagram with tablet.   Pricilla Riffle. Fuller Plan, MD [facility]

## 2018-04-10 NOTE — Telephone Encounter (Signed)
The pt has been advised that carafate tablets have been sent to the pharmacy and she can make a slurry by mixing with water.  She will call with any further issues with cost or questions regarding the medication.

## 2018-04-11 ENCOUNTER — Ambulatory Visit (AMBULATORY_SURGERY_CENTER): Payer: BC Managed Care – PPO | Admitting: Gastroenterology

## 2018-04-11 ENCOUNTER — Encounter: Payer: Self-pay | Admitting: Gastroenterology

## 2018-04-11 VITALS — BP 139/91 | HR 73 | Temp 98.4°F | Resp 18 | Ht 61.0 in | Wt 200.0 lb

## 2018-04-11 DIAGNOSIS — R1310 Dysphagia, unspecified: Secondary | ICD-10-CM | POA: Diagnosis present

## 2018-04-11 DIAGNOSIS — K222 Esophageal obstruction: Secondary | ICD-10-CM | POA: Diagnosis not present

## 2018-04-11 DIAGNOSIS — K219 Gastro-esophageal reflux disease without esophagitis: Secondary | ICD-10-CM | POA: Diagnosis not present

## 2018-04-11 MED ORDER — SODIUM CHLORIDE 0.9 % IV SOLN: 500.0000 mL | Freq: Once | INTRAVENOUS | Status: DC

## 2018-04-11 NOTE — Op Note (Signed)
[facility] Patient Name: Marilyn [name] Procedure Date: 04/11/2018 2:58 PM MRN: [id number] Endoscopist: Ladene Artist , MD Age: 52 Referring MD:  Date of Birth: [birth year] Gender: Female Account #: [id number] Procedure:                Upper GI endoscopy Indications:              Dysphagia Medicines:                Monitored Anesthesia Care Procedure:                Pre-Anesthesia Assessment:                           - Prior to the procedure, a History and Physical                            was performed, and patient medications and                            allergies were reviewed. The patient's tolerance of                            previous anesthesia was also reviewed. The risks                            and benefits of the procedure and the sedation                            options and risks were discussed with the patient.                            All questions were answered, and informed consent                            was obtained. Prior Anticoagulants: The patient has                            taken no previous anticoagulant or antiplatelet                            agents. ASA Grade Assessment: II - A patient with                            mild systemic disease. After reviewing the risks                            and benefits, the patient was deemed in                            satisfactory condition to undergo the procedure.                           After obtaining informed consent, the endoscope was  passed under direct vision. Throughout the                            procedure, the patient's blood pressure, pulse, and                            oxygen saturations were monitored continuously. The                            Endoscope was introduced through the mouth, and                            advanced to the second part of duodenum. The upper                            GI endoscopy was accomplished without  difficulty.                            The patient tolerated the procedure well. Scope In: Scope Out: Findings:                 One benign-appearing, intrinsic mild stenosis was                            found at the gastroesophageal junction. This                            stenosis measured 1.5 cm (inner diameter). The                            stenosis was traversed. A guidewire was placed and                            the scope was withdrawn. Dilation was performed                            with a Savary dilator with no resistance at 16 mm.                            Dilation was performed with a Savary dilator with                            mild resistance at 17 mm. Heme on the second                            dilator.                           The exam of the esophagus was otherwise normal.                           A small hiatal hernia was present.  The exam of the stomach was otherwise normal.                           The duodenal bulb and second portion of the                            duodenum were normal. Complications:            No immediate complications. Estimated Blood Loss:     Estimated blood loss was minimal. Impression:               - Benign-appearing esophageal stenosis. Dilated.                           - Small hiatal hernia.                           - Normal duodenal bulb and second portion of the                            duodenum.                           - No specimens collected. Recommendation:           - Patient has a contact number available for                            emergencies. The signs and symptoms of potential                            delayed complications were discussed with the                            patient. Return to normal activities tomorrow.                            Written discharge instructions were provided to the                            patient.                           - Clear liquid  diet for 2 hours, then advance as                            tolerated to soft diet today.                           - Antireflux measures long term.                           - Resume prior diet tomrrow.                           - Continue present medications. Ladene Artist, MD 04/11/2018 3:17:45 PM This report has been signed electronically.

## 2018-04-11 NOTE — Progress Notes (Signed)
Pt. In wheelchair ready to be discharged, and c/o bilateral ear pressure/ache.   Brought pt. Back into recovery area.  Genella Rife CRNA communicated with pt. Re: ear discomfort related to jaw thrust to open pt. Airway/throat for the procedure.  Told pt. It may take 24-28 hours to resolve.  Pt. Discharged from recovery room with phone no. To call should she have further concerns after hours.

## 2018-04-11 NOTE — Progress Notes (Signed)
Report to PACU, RN, vss, BBS= Clear.  

## 2018-04-11 NOTE — Progress Notes (Signed)
Pt's states no medical or surgical changes since previsit or office visit. 

## 2018-04-11 NOTE — Progress Notes (Signed)
Called to room to assist during endoscopic procedure.  Patient ID and intended procedure confirmed with present staff. Received instructions for my participation in the procedure from the performing physician.  

## 2018-04-11 NOTE — Patient Instructions (Signed)
Impression/Recommendations:  Hiatal hernia handout given to patient., Dilation diet handout given to patient.  Clear liquid diet for 2 hours, then advance diet as tolerated to soft diet today. Antireflux measures.  Resume prior diet tomorrow.  Continue present medications.  YOU HAD AN ENDOSCOPIC PROCEDURE TODAY AT [facility]:   Refer to the procedure report that was given to you for any specific questions about what was found during the examination.  If the procedure report does not answer your questions, please call your gastroenterologist to clarify.  If you requested that your care partner not be given the details of your procedure findings, then the procedure report has been included in a sealed envelope for you to review at your convenience later.  YOU SHOULD EXPECT: Some feelings of bloating in the abdomen. Passage of more gas than usual.  Walking can help get rid of the air that was put into your GI tract during the procedure and reduce the bloating. If you had a lower endoscopy (such as a colonoscopy or flexible sigmoidoscopy) you may notice spotting of blood in your stool or on the toilet paper. If you underwent a bowel prep for your procedure, you may not have a normal bowel movement for a few days.  Please Note:  You might notice some irritation and congestion in your nose or some drainage.  This is from the oxygen used during your procedure.  There is no need for concern and it should clear up in a day or so.  SYMPTOMS TO REPORT IMMEDIATELY:   Following upper endoscopy (EGD)  Vomiting of blood or coffee ground material  New chest pain or pain under the shoulder blades  Painful or persistently difficult swallowing  New shortness of breath  Fever of 100F or higher  Black, tarry-looking stools  For urgent or emergent issues, a gastroenterologist can be reached at any hour by calling [phone].   DIET:  We do recommend a small meal at first, but then  you may proceed to your regular diet.  Drink plenty of fluids but you should avoid alcoholic beverages for 24 hours.  ACTIVITY:  You should plan to take it easy for the rest of today and you should NOT DRIVE or use heavy machinery until tomorrow (because of the sedation medicines used during the test).    FOLLOW UP: Our staff will call the number listed on your records the next business day following your procedure to check on you and address any questions or concerns that you may have regarding the information given to you following your procedure. If we do not reach you, we will leave a message.  However, if you are feeling well and you are not experiencing any problems, there is no need to return our call.  We will assume that you have returned to your regular daily activities without incident.  If any biopsies were taken you will be contacted by phone or by letter within the next 1-3 weeks.  Please call us at [phone] if you have not heard about the biopsies in 3 weeks.    SIGNATURES/CONFIDENTIALITY: You and/or your care partner have signed paperwork which will be entered into your electronic medical record.  These signatures attest to the fact that that the information above on your After Visit Summary has been reviewed and is understood.  Full responsibility of the confidentiality of this discharge information lies with you and/or your care-partner.

## 2018-04-14 ENCOUNTER — Telehealth: Payer: Self-pay

## 2018-04-14 NOTE — Telephone Encounter (Signed)
  Follow up Call-  Call back number 04/11/2018  Post procedure Call Back phone  # [phone]  Permission to leave phone message Yes  Some recent data might be hidden     Patient questions:  Do you have a fever, pain , or abdominal swelling? No. Pain Score  0 *  Have you tolerated food without any problems? Yes.    Have you been able to return to your normal activities? Yes.    Do you have any questions about your discharge instructions: Diet   No. Medications  No. Follow up visit  No.  Do you have questions or concerns about your Care? No.  Actions: * If pain score is 4 or above: No action needed, pain <4.

## 2018-04-22 ENCOUNTER — Ambulatory Visit (INDEPENDENT_AMBULATORY_CARE_PROVIDER_SITE_OTHER)
Admission: RE | Admit: 2018-04-22 | Discharge: 2018-04-22 | Disposition: A | Discharge status: Home / Self Care | Payer: BC Managed Care – PPO | Source: Ambulatory Visit | Attending: Gastroenterology | Admitting: Gastroenterology

## 2018-04-22 DIAGNOSIS — R221 Localized swelling, mass and lump, neck: Secondary | ICD-10-CM | POA: Diagnosis not present

## 2018-04-22 MED ORDER — IOPAMIDOL (ISOVUE-300) INJECTION 61%
75.0000 mL | Freq: Once | INTRAVENOUS | Status: AC | PRN
  Administered 2018-04-22: 75 mL via INTRAVENOUS

## 2018-04-28 ENCOUNTER — Telehealth: Payer: Self-pay | Admitting: Gastroenterology

## 2018-04-28 NOTE — Telephone Encounter (Signed)
Marilyn [name] the pt has called regarding CT results

## 2018-04-29 NOTE — Telephone Encounter (Signed)
The pt was advised that the CT did not show any cause for her dysphagia and that Janett Billow would have to review when she is back for further recommendations.

## 2018-04-29 NOTE — Telephone Encounter (Signed)
Pt is calling back about her CT scan results and wants to know if another MD can review the results because she is having problems swallowing.  She is aware Janett Billow is out sick [phone]

## 2018-04-30 NOTE — Telephone Encounter (Signed)
Left message on machine to call back  

## 2018-04-30 NOTE — Telephone Encounter (Signed)
Agree.  CT scan did not show any cause of dysphagia.  I know that she had EGD with dilation as well and I am guessing that did not help since she is still complaining of issues with swallowing.  Please confirm if it helped at all.  Next thing would be to repeat MBSS, but that was unremarkable a few years ago.  Then next step could be to refer to ENT since CT scan showed possible issue with vocal cord to see if they think that would affect her swallowing.  See what route she wants to go first.  Thank you,  Jess

## 2018-05-05 NOTE — Telephone Encounter (Signed)
The pt has been advised and referral to Dr Lucia Gaskins ENT ok'd.

## 2018-05-05 NOTE — Telephone Encounter (Signed)
Tuesday 2 pm Dr Lucia Gaskins appt. The pt has been advised.

## 2018-05-07 NOTE — Telephone Encounter (Signed)
Marilyn [name] will you call the pt?

## 2018-05-07 NOTE — Telephone Encounter (Signed)
Patient calling requesting to speak with Marilyn [name]. Patient states it is concerning something about her that she only wants to speak with Marilyn [name] about. Patient would not give further details.

## 2018-05-07 NOTE — Telephone Encounter (Signed)
I will call her back on Friday. I am off tomorrow.

## 2018-05-12 ENCOUNTER — Ambulatory Visit: Payer: BC Managed Care – PPO | Admitting: Family Medicine

## 2018-05-12 ENCOUNTER — Encounter: Payer: Self-pay | Admitting: Family Medicine

## 2018-05-12 VITALS — BP 118/76 | HR 72 | Temp 98.1°F | Ht 61.0 in | Wt 200.0 lb

## 2018-05-12 DIAGNOSIS — E782 Mixed hyperlipidemia: Secondary | ICD-10-CM | POA: Diagnosis not present

## 2018-05-12 DIAGNOSIS — I10 Essential (primary) hypertension: Secondary | ICD-10-CM | POA: Diagnosis not present

## 2018-05-12 DIAGNOSIS — E890 Postprocedural hypothyroidism: Secondary | ICD-10-CM

## 2018-05-12 DIAGNOSIS — K219 Gastro-esophageal reflux disease without esophagitis: Secondary | ICD-10-CM

## 2018-05-12 DIAGNOSIS — Z7689 Persons encountering health services in other specified circumstances: Secondary | ICD-10-CM

## 2018-05-12 LAB — TSH: TSH: 1.63 u[IU]/mL (ref 0.35–4.50)

## 2018-05-12 MED ORDER — LEVOTHYROXINE SODIUM 100 MCG PO TABS: ORAL_TABLET | ORAL | 1 refills | Status: DC

## 2018-05-12 MED ORDER — AMLODIPINE BESYLATE 10 MG PO TABS: ORAL_TABLET | ORAL | 3 refills | Status: DC

## 2018-05-12 NOTE — Patient Instructions (Signed)
Managing Your Hypertension Hypertension is commonly called high blood pressure. This is when the force of your blood pressing against the walls of your arteries is too strong. Arteries are blood vessels that carry blood from your heart throughout your body. Hypertension forces the heart to work harder to pump blood, and may cause the arteries to become narrow or stiff. Having untreated or uncontrolled hypertension can cause heart attack, stroke, kidney disease, and other problems. What are blood pressure readings? A blood pressure reading consists of a higher number over a lower number. Ideally, your blood pressure should be below 120/80. The first ("top") number is called the systolic pressure. It is a measure of the pressure in your arteries as your heart beats. The second ("bottom") number is called the diastolic pressure. It is a measure of the pressure in your arteries as the heart relaxes. What does my blood pressure reading mean? Blood pressure is classified into four stages. Based on your blood pressure reading, your health care provider may use the following stages to determine what type of treatment you need, if any. Systolic pressure and diastolic pressure are measured in a unit called mm Hg. Normal  Systolic pressure: below 120.  Diastolic pressure: below 80. Elevated  Systolic pressure: 120-129.  Diastolic pressure: below 80. Hypertension stage 1  Systolic pressure: 130-139.  Diastolic pressure: 80-89. Hypertension stage 2  Systolic pressure: 140 or above.  Diastolic pressure: 90 or above. What health risks are associated with hypertension? Managing your hypertension is an important responsibility. Uncontrolled hypertension can lead to:  A heart attack.  A stroke.  A weakened blood vessel (aneurysm).  Heart failure.  Kidney damage.  Eye damage.  Metabolic syndrome.  Memory and concentration problems.  What changes can I make to manage my  hypertension? Hypertension can be managed by making lifestyle changes and possibly by taking medicines. Your health care provider will help you make a plan to bring your blood pressure within a normal range. Eating and drinking  Eat a diet that is high in fiber and potassium, and low in salt (sodium), added sugar, and fat. An example eating plan is called the DASH (Dietary Approaches to Stop Hypertension) diet. To eat this way: ? Eat plenty of fresh fruits and vegetables. Try to fill half of your plate at each meal with fruits and vegetables. ? Eat whole grains, such as whole wheat pasta, brown rice, or whole grain bread. Fill about one quarter of your plate with whole grains. ? Eat low-fat diary products. ? Avoid fatty cuts of meat, processed or cured meats, and poultry with skin. Fill about one quarter of your plate with lean proteins such as fish, chicken without skin, beans, eggs, and tofu. ? Avoid premade and processed foods. These tend to be higher in sodium, added sugar, and fat.  Reduce your daily sodium intake. Most people with hypertension should eat less than 1,500 mg of sodium a day.  Limit alcohol intake to no more than 1 drink a day for nonpregnant women and 2 drinks a day for men. One drink equals 12 oz of beer, 5 oz of wine, or 1 oz of hard liquor. Lifestyle  Work with your health care provider to maintain a healthy body weight, or to lose weight. Ask what an ideal weight is for you.  Get at least 30 minutes of exercise that causes your heart to beat faster (aerobic exercise) most days of the week. Activities may include walking, swimming, or biking.  Include exercise   to strengthen your muscles (resistance exercise), such as weight lifting, as part of your weekly exercise routine. Try to do these types of exercises for 30 minutes at least 3 days a week.  Do not use any products that contain nicotine or tobacco, such as cigarettes and e-cigarettes. If you need help quitting, ask  your health care provider.  Control any long-term (chronic) conditions you have, such as high cholesterol or diabetes. Monitoring  Monitor your blood pressure at home as told by your health care provider. Your personal target blood pressure may vary depending on your medical conditions, your age, and other factors.  Have your blood pressure checked regularly, as often as told by your health care provider. Working with your health care provider  Review all the medicines you take with your health care provider because there may be side effects or interactions.  Talk with your health care provider about your diet, exercise habits, and other lifestyle factors that may be contributing to hypertension.  Visit your health care provider regularly. Your health care provider can help you create and adjust your plan for managing hypertension. Will I need medicine to control my blood pressure? Your health care provider may prescribe medicine if lifestyle changes are not enough to get your blood pressure under control, and if:  Your systolic blood pressure is 130 or higher.  Your diastolic blood pressure is 80 or higher.  Take medicines only as told by your health care provider. Follow the directions carefully. Blood pressure medicines must be taken as prescribed. The medicine does not work as well when you skip doses. Skipping doses also puts you at risk for problems. Contact a health care provider if:  You think you are having a reaction to medicines you have taken.  You have repeated (recurrent) headaches.  You feel dizzy.  You have swelling in your ankles.  You have trouble with your vision. Get help right away if:  You develop a severe headache or confusion.  You have unusual weakness or numbness, or you feel faint.  You have severe pain in your chest or abdomen.  You vomit repeatedly.  You have trouble breathing. Summary  Hypertension is when the force of blood pumping through  your arteries is too strong. If this condition is not controlled, it may put you at risk for serious complications.  Your personal target blood pressure may vary depending on your medical conditions, your age, and other factors. For most people, a normal blood pressure is less than 120/80.  Hypertension is managed by lifestyle changes, medicines, or both. Lifestyle changes include weight loss, eating a healthy, low-sodium diet, exercising more, and limiting alcohol. This information is not intended to replace advice given to you by your health care provider. Make sure you discuss any questions you have with your health care provider. Document Released: 02/13/2012 Document Revised: 04/18/2016 Document Reviewed: 04/18/2016 Elsevier Interactive Patient Education  2018 [employer].  How to Take Your Blood Pressure You can take your blood pressure at home with a machine. You may need to check your blood pressure at home:  To check if you have high blood pressure (hypertension).  To check your blood pressure over time.  To make sure your blood pressure medicine is working.  Supplies needed: You will need a blood pressure machine, or monitor. You can buy one at a drugstore or online. When choosing one:  Choose one with an arm cuff.  Choose one that wraps around your upper arm. Only one  finger should fit between your arm and the cuff.  Do not choose one that measures your blood pressure from your wrist or finger.  Your doctor can suggest a monitor. How to prepare Avoid these things for 30 minutes before checking your blood pressure:  Drinking caffeine.  Drinking alcohol.  Eating.  Smoking.  Exercising.  Five minutes before checking your blood pressure:  Pee.  Sit in a dining chair. Avoid sitting in a soft couch or armchair.  Be quiet. Do not talk.  How to take your blood pressure Follow the instructions that came with your machine. If you have a digital blood pressure  monitor, these may be the instructions: 1. Sit up straight. 2. Place your feet on the floor. Do not cross your ankles or legs. 3. Rest your left arm at the level of your heart. You may rest it on a table, desk, or chair. 4. Pull up your shirt sleeve. 5. Wrap the blood pressure cuff around the upper part of your left arm. The cuff should be 1 inch (2.5 cm) above your elbow. It is best to wrap the cuff around bare skin. 6. Fit the cuff snugly around your arm. You should be able to place only one finger between the cuff and your arm. 7. Put the cord inside the groove of your elbow. 8. Press the power button. 9. Sit quietly while the cuff fills with air and loses air. 10. Write down the numbers on the screen. 11. Wait 2-3 minutes and then repeat steps 1-10.  What do the numbers mean? Two numbers make up your blood pressure. The first number is called systolic pressure. The second is called diastolic pressure. An example of a blood pressure reading is "120 over 80" (or 120/80). If you are an adult and do not have a medical condition, use this guide to find out if your blood pressure is normal: Normal  First number: below 120.  Second number: below 80. Elevated  First number: 120-129.  Second number: below 80. Hypertension stage 1  First number: 130-139.  Second number: 80-89. Hypertension stage 2  First number: 140 or above.  Second number: 61 or above. Your blood pressure is above normal even if only the top or bottom number is above normal. Follow these instructions at home:  Check your blood pressure as often as your doctor tells you to.  Take your monitor to your next doctor's appointment. Your doctor will: ? Make sure you are using it correctly. ? Make sure it is working right.  Make sure you understand what your blood pressure numbers should be.  Tell your doctor if your medicines are causing side effects. Contact a doctor if:  Your blood pressure keeps being  high. Get help right away if:  Your first blood pressure number is higher than 180.  Your second blood pressure number is higher than 120. This information is not intended to replace advice given to you by your health care provider. Make sure you discuss any questions you have with your health care provider. Document Released: 05/03/2008 Document Revised: 04/18/2016 Document Reviewed: 10/28/2015 Elsevier Interactive Patient Education  2018 [employer].  High Cholesterol High cholesterol is a condition in which the blood has high levels of a white, waxy, fat-like substance (cholesterol). The human body needs small amounts of cholesterol. The liver makes all the cholesterol that the body needs. Extra (excess) cholesterol comes from the food that we eat. Cholesterol is carried from the liver by the blood  through the blood vessels. If you have high cholesterol, deposits (plaques) may build up on the walls of your blood vessels (arteries). Plaques make the arteries narrower and stiffer. Cholesterol plaques increase your risk for heart attack and stroke. Work with your health care provider to keep your cholesterol levels in a healthy range. What increases the risk? This condition is more likely to develop in people who:  Eat foods that are high in animal fat (saturated fat) or cholesterol.  Are overweight.  Are not getting enough exercise.  Have a family history of high cholesterol.  What are the signs or symptoms? There are no symptoms of this condition. How is this diagnosed? This condition may be diagnosed from the results of a blood test.  If you are older than age 52, your health care provider may check your cholesterol every 4-6 years.  You may be checked more often if you already have high cholesterol or other risk factors for heart disease.  The blood test for cholesterol measures:  "Bad" cholesterol (LDL cholesterol). This is the main type of cholesterol that causes heart  disease. The desired level for LDL is less than 100.  "Good" cholesterol (HDL cholesterol). This type helps to protect against heart disease by cleaning the arteries and carrying the LDL away. The desired level for HDL is 60 or higher.  Triglycerides. These are fats that the body can store or burn for energy. The desired number for triglycerides is lower than 150.  Total cholesterol. This is a measure of the total amount of cholesterol in your blood, including LDL cholesterol, HDL cholesterol, and triglycerides. A healthy number is less than 200.  How is this treated? This condition is treated with diet changes, lifestyle changes, and medicines. Diet changes  This may include eating more whole grains, fruits, vegetables, nuts, and fish.  This may also include cutting back on red meat and foods that have a lot of added sugar. Lifestyle changes  Changes may include getting at least 40 minutes of aerobic exercise 3 times a week. Aerobic exercises include walking, biking, and swimming. Aerobic exercise along with a healthy diet can help you maintain a healthy weight.  Changes may also include quitting smoking. Medicines  Medicines are usually given if diet and lifestyle changes have failed to reduce your cholesterol to healthy levels.  Your health care provider may prescribe a statin medicine. Statin medicines have been shown to reduce cholesterol, which can reduce the risk of heart disease. Follow these instructions at home: Eating and drinking  If told by your health care provider:  Eat chicken (without skin), fish, veal, shellfish, ground [place] breast, and round or loin cuts of red meat.  Do not eat fried foods or fatty meats, such as hot dogs and salami.  Eat plenty of fruits, such as apples.  Eat plenty of vegetables, such as broccoli, potatoes, and carrots.  Eat beans, peas, and lentils.  Eat grains such as barley, rice, couscous, and bulgur wheat.  Eat pasta without  cream sauces.  Use skim or nonfat milk, and eat low-fat or nonfat yogurt and cheeses.  Do not eat or drink whole milk, cream, ice cream, egg yolks, or hard cheeses.  Do not eat stick margarine or tub margarines that contain trans fats (also called partially hydrogenated oils).  Do not eat saturated tropical oils, such as coconut oil and palm oil.  Do not eat cakes, cookies, crackers, or other baked goods that contain trans fats.  General instructions  Exercise as directed by your health care provider. Increase your activity level with activities such as gardening, walking, and taking the stairs.  Take over-the-counter and prescription medicines only as told by your health care provider.  Do not use any products that contain nicotine or tobacco, such as cigarettes and e-cigarettes. If you need help quitting, ask your health care provider.  Keep all follow-up visits as told by your health care provider. This is important. Contact a health care provider if:  You are struggling to maintain a healthy diet or weight.  You need help to start on an exercise program.  You need help to stop smoking. Get help right away if:  You have chest pain.  You have trouble breathing. This information is not intended to replace advice given to you by your health care provider. Make sure you discuss any questions you have with your health care provider. Document Released: 05/21/2005 Document Revised: 12/17/2015 Document Reviewed: 11/19/2015 Elsevier Interactive Patient Education  [employer].

## 2018-05-12 NOTE — Progress Notes (Signed)
Patient presents to clinic today to establish care.  SUBJECTIVE: PMH: Pt is a 52 yo female with pmh sig for HTN, HLD, GERD, hypothyroisim, asthma, dysphagia.  Pt was previously seen by Ivar Drape, [place] at [place].  HTN: -not checking bp at home -taking Norvasc 10 mg. Needs refill -drinking lots of water  HLD: -taking Lipitor 40 mg daily -eating fried foods a few times per wk  Hypothyroidism: -s/p thyroidectomy at age 51 or 73 -taking synthroid 100 mcg daily -no recent TSH -denies constipation, diarrhea, palpitation, hair loss  GERD: -taking protonix 40 mg -pizza gives symptoms -notes unable to eat [employer] pizza 2/2 feeling of throat closing.  Has eaten other pizza chains without issue  Dysphagia: -pills and food getting stuck in throat -seen by Dr. Fuller Plan -s/p esophageal dilation -Seen by Radene Journey started on prednisone 2/2 concerns about vocal cord dysfunction after CT scan 3 wks ago -has f/u appt on 17th  Allergies: Tuna-anaphylaxis Promethazine Codeine- n/v flonase-epistaxis  Social Hx: Pt works as a Publishing rights manager.  Pt denies EtOH, tobacco or drug use.  Pt is a former smoker.  She smoked cigarettes, quit in the 80s.   Past Medical History:  Diagnosis Date  . Allergy   . 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/2007  . ASTHMA 01/30/2007  . Asthma   . Breast tumor    right; benign  . Chest pain 01/24/2017  . GERD 01/31/2007  . Hemorrhoids   . Hiatal hernia   . HTN (hypertension) 09/09/2011  . Hyperlipidemia   . HYPOTHYROIDISM 01/30/2007  . Mallory Mariel Kansky tear 04/2003    Past Surgical History:  Procedure Laterality Date  . BREAST LUMPECTOMY Right    benign  . BREAST LUMPECTOMY WITH RADIOACTIVE SEED LOCALIZATION Right 02/25/2015   Procedure: BREAST LUMPECTOMY WITH RADIOACTIVE SEED LOCALIZATION;  Surgeon: Alphonsa Overall, MD;  Location: [facility];  Service: General;  Laterality: Right;  . BREAST REDUCTION SURGERY  1986  . BREAST  SURGERY    . broken jaw    . CARPAL TUNNEL RELEASE  2001  . FRACTURE SURGERY    . REDUCTION MAMMAPLASTY Bilateral   . THYROIDECTOMY    . TUBAL LIGATION  1987    Current Outpatient Medications on File Prior to Visit  Medication Sig Dispense Refill  . albuterol (PROVENTIL HFA;VENTOLIN HFA) 108 (90 Base) MCG/ACT inhaler Inhale 2 puffs into the lungs every 4 (four) hours as needed for wheezing or shortness of breath (cough, shortness of breath or wheezing.). 2 Inhaler 1  . amLODipine (NORVASC) 10 MG tablet TAKE ONE TABLET BY MOUTH DAILY  NEEDS APPOINTMENT 30 tablet 0  . atorvastatin (LIPITOR) 40 MG tablet Take 1 tablet (40 mg total) by mouth daily. 90 tablet 3  . cephALEXin (KEFLEX) 250 MG/5ML suspension Take 10 mLs by mouth every 6 (six) hours. For 10 days    . cyclobenzaprine (FLEXERIL) 5 MG tablet Take 1 tablet (5 mg total) by mouth 3 (three) times daily as needed. 90 tablet 1  . EPIPEN 2-PAK 0.3 MG/0.3ML SOAJ injection Inject 0.3 mg as directed as needed for anaphylaxis.    [name] levothyroxine (SYNTHROID, LEVOTHROID) 100 MCG tablet Take one tablet daily. 90 tablet 1  . meloxicam (MOBIC) 7.5 MG tablet Take 1 tablet (7.5 mg total) by mouth daily. 30 tablet 0  . naproxen (NAPROSYN) 500 MG tablet Take 1 tablet by mouth 2 (two) times daily. For 15 days    . pantoprazole (PROTONIX) 40 MG tablet Take 1 tablet (  40 mg total) by mouth 2 (two) times daily. 60 tablet 5  . polyethylene glycol powder (GLYCOLAX/MIRALAX) powder Take 17 g by mouth daily. 500 g 1  . polyvinyl alcohol (LIQUIFILM TEARS) 1.4 % ophthalmic solution Place 1 drop into both eyes at bedtime. 15 mL 0  . sucralfate (CARAFATE) 1 g tablet Take 1 tablet (1 g total) by mouth 4 (four) times daily -  with meals and at bedtime. 120 tablet 2   No current facility-administered medications on file prior to visit.     Allergies  Allergen Reactions  . Promethazine-Dm   . Tuna [Fish Allergy] Anaphylaxis  . Codeine Nausea And Vomiting  . Flonase  [Fluticasone Propionate] Other (See Comments)    Nose bleeds    Family History  Problem Relation Age of Onset  . Hypertension Father   . Diabetes Father   . Hyperlipidemia Father   . Asthma Mother   . Breast cancer Sister   . Thyroid disease Sister   . Diabetes Maternal Grandmother   . Heart disease Maternal Grandmother   . Heart attack Maternal Grandmother 60  . Asthma Maternal Grandfather   . Heart attack Maternal Grandfather   . Colon cancer Neg Hx     Social History   Socioeconomic History  . Marital status: Single    Spouse name: Not on file  . Number of children: 3  . Years of education: Not on file  . Highest education level: Not on file  Occupational History  . 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[employer]    Employer: [employer]  Social Needs  . Financial resource strain: Not on file  . Food insecurity:    Worry: Not on file    Inability: Not on file  . Transportation needs:    Medical: Not on file    Non-medical: Not on file  Tobacco Use  . Smoking status: Former Smoker    Packs/day: 0.25    Years: 3.00    Pack years: 0.75    Types: Cigarettes    Last attempt to quit: 06/04/1980    Years since quitting: 37.9  . Smokeless tobacco: Never Used  Substance and Sexual Activity  . Alcohol use: No    Alcohol/week: 0.0 standard drinks  . Drug use: No  . Sexual activity: Not on file  Lifestyle  . Physical activity:    Days per week: Not on file    Minutes per session: Not on file  . Stress: Not on file  Relationships  . Social connections:    Talks on phone: Not on file    Gets together: Not on file    Attends religious service: Not on file    Active member of club or organization: Not on file    Attends meetings of clubs or organizations: Not on file    Relationship status: Not on file  . Intimate partner violence:    Fear of current or ex partner: Not on file    Emotionally abused: Not on file    Physically abused: Not on file    Forced sexual activity: Not  on file  Other Topics Concern  . Not on file  Social History Narrative   Married    ROS General: Denies fever, chills, night sweats, changes in weight, changes in appetite HEENT: Denies headaches, ear pain, changes in vision, rhinorrhea, sore throat CV: Denies CP, palpitations, SOB, orthopnea Pulm: Denies SOB, cough, wheezing GI: Denies abdominal pain, nausea, vomiting, diarrhea, constipation  +GERD, dysphagia GU: Denies  dysuria, hematuria, frequency, vaginal discharge Msk: Denies muscle cramps, joint pains Neuro: Denies weakness, numbness, tingling Skin: Denies rashes, bruising Psych: Denies depression, anxiety, hallucinations  BP 118/76 (BP Location: Left Arm, Patient Position: Sitting, Cuff Size: Large)   Pulse 72   Temp 98.1 F (36.7 C) (Oral)   Ht 5\' 1"  (1.549 m)   Wt 200 lb (90.7 kg)   LMP 03/18/2014   SpO2 97%   BMI 37.79 kg/m   Physical Exam Gen. Pleasant, well developed, well-nourished, in NAD HEENT - [place]/AT, PERRL,  no scleral icterus, no nasal drainage Lungs: no use of accessory muscles, CTAB, no wheezes, rales or rhonchi Cardiovascular: RRR, No r/g/m, no peripheral edema Abdomen: BS present, soft, nontender,nondistended Neuro:  A&Ox3, CN II-XII intact, normal gait Skin:  Warm, dry, intact, no lesions  No results found for this or any previous visit (from the past 2160 hour(s)).  Assessment/Plan: Essential hypertension  -controlled -encouraged to check bp at home and on lifestyle modifications -given handouts - Plan: amLODipine (NORVASC) 10 MG tablet  Postoperative hypothyroidism  -will obtain TSH this visit - Plan: levothyroxine (SYNTHROID, LEVOTHROID) 100 MCG tablet, TSH  Gastroesophageal reflux disease, esophagitis presence not specified -continue protonix 40 mg -continue f/u with GI and ENT -discussed avoiding foods known to cause problems.  Mixed hyperlipidemia -continue lipitor 40 mg daily -discussed lifestyle modificaitons  Encounter to  establish care -We reviewed the PMH, PSH, FH, SH, Meds and Allergies. -We provided refills for any medications we will prescribe as needed. -We addressed current concerns per orders and patient instructions. -We have asked for records for pertinent exams, studies, vaccines and notes from previous providers. -We have advised patient to follow up per instructions below.  F/u in 3 months  Grier Mitts, MD

## 2018-05-13 ENCOUNTER — Other Ambulatory Visit: Payer: Self-pay | Admitting: Family Medicine

## 2018-05-13 DIAGNOSIS — Z1231 Encounter for screening mammogram for malignant neoplasm of breast: Secondary | ICD-10-CM

## 2018-06-02 ENCOUNTER — Ambulatory Visit: Payer: BC Managed Care – PPO | Admitting: Family Medicine

## 2018-06-10 ENCOUNTER — Ambulatory Visit
Admission: RE | Admit: 2018-06-10 | Discharge: 2018-06-10 | Disposition: A | Discharge status: Home / Self Care | Payer: BC Managed Care – PPO | Source: Ambulatory Visit | Attending: Family Medicine | Admitting: Family Medicine

## 2018-06-10 DIAGNOSIS — Z1231 Encounter for screening mammogram for malignant neoplasm of breast: Secondary | ICD-10-CM

## 2018-07-09 ENCOUNTER — Emergency Department (HOSPITAL_COMMUNITY): Payer: BC Managed Care – PPO

## 2018-07-09 ENCOUNTER — Emergency Department (HOSPITAL_COMMUNITY)
Admission: EM | Admit: 2018-07-09 | Discharge: 2018-07-10 | Disposition: A | Discharge status: Home / Self Care | Payer: BC Managed Care – PPO | Attending: Emergency Medicine | Admitting: Emergency Medicine

## 2018-07-09 ENCOUNTER — Other Ambulatory Visit: Payer: Self-pay

## 2018-07-09 DIAGNOSIS — J45909 Unspecified asthma, uncomplicated: Secondary | ICD-10-CM | POA: Diagnosis not present

## 2018-07-09 DIAGNOSIS — I10 Essential (primary) hypertension: Secondary | ICD-10-CM | POA: Diagnosis not present

## 2018-07-09 DIAGNOSIS — E039 Hypothyroidism, unspecified: Secondary | ICD-10-CM | POA: Insufficient documentation

## 2018-07-09 DIAGNOSIS — R0789 Other chest pain: Secondary | ICD-10-CM | POA: Insufficient documentation

## 2018-07-09 DIAGNOSIS — R079 Chest pain, unspecified: Secondary | ICD-10-CM | POA: Diagnosis present

## 2018-07-09 DIAGNOSIS — Z79899 Other long term (current) drug therapy: Secondary | ICD-10-CM | POA: Diagnosis not present

## 2018-07-09 DIAGNOSIS — Z87891 Personal history of nicotine dependence: Secondary | ICD-10-CM | POA: Insufficient documentation

## 2018-07-09 LAB — I-STAT TROPONIN, ED: TROPONIN I, POC: 0 ng/mL (ref 0.00–0.08)

## 2018-07-09 LAB — BASIC METABOLIC PANEL
Anion gap: 8 (ref 5–15)
BUN: 14 mg/dL (ref 6–20)
CO2: 24 mmol/L (ref 22–32)
CREATININE: 0.86 mg/dL (ref 0.44–1.00)
Calcium: 8.1 mg/dL — ABNORMAL LOW (ref 8.9–10.3)
Chloride: 107 mmol/L (ref 98–111)
GFR calc Af Amer: >60 mL/min (ref >60)
GFR calc non Af Amer: >60 mL/min (ref >60)
Glucose, Bld: 138 mg/dL — ABNORMAL HIGH (ref 70–99)
Potassium: 3.3 mmol/L — ABNORMAL LOW (ref 3.5–5.1)
Sodium: 139 mmol/L (ref 135–145)

## 2018-07-09 LAB — CBC
HCT: 36.9 % (ref 36.0–46.0)
Hemoglobin: 11.7 g/dL — ABNORMAL LOW (ref 12.0–15.0)
MCH: 27.2 pg (ref 26.0–34.0)
MCHC: 31.7 g/dL (ref 30.0–36.0)
MCV: 85.8 fL (ref 80.0–100.0)
Platelets: 222 10*3/uL (ref 150–400)
RBC: 4.3 MIL/uL (ref 3.87–5.11)
RDW: 12.9 % (ref 11.5–15.5)
WBC: 4.3 10*3/uL (ref 4.0–10.5)
nRBC: 0 % (ref 0.0–0.2)

## 2018-07-09 LAB — I-STAT BETA HCG BLOOD, ED (MC, WL, AP ONLY): I-stat hCG, quantitative: <5 m[IU]/mL (ref <5)

## 2018-07-09 MED ORDER — SODIUM CHLORIDE 0.9% FLUSH: 3.0000 mL | Freq: Once | INTRAVENOUS | Status: DC

## 2018-07-09 NOTE — ED Triage Notes (Signed)
Patient reports chest pain that radiates to her left arm and up into her jaw. Pt reports nausea with no emesis and slight abdominal pain. Pt reports it started around 1900 today. Pt is not diaphoretic at this time

## 2018-07-10 LAB — I-STAT TROPONIN, ED: Troponin i, poc: 0 ng/mL (ref 0.00–0.08)

## 2018-07-10 NOTE — ED Notes (Signed)
Pt ambulated to the bathroom with no assistance. Gait steady  

## 2018-07-10 NOTE — ED Notes (Signed)
Pt verbalized discharge instructions and follow up care. Alert and ambulatory  

## 2018-07-10 NOTE — ED Provider Notes (Signed)
[facility] DEPT Provider Note: Georgena Spurling, MD, FACEP  CSN: [id number] MRN: [id number] ARRIVAL: 07/09/18 at 2152 ROOM: [employer]  Chest Pain   HISTORY OF PRESENT ILLNESS  07/10/18 1:05 AM Marilyn [name] is a 53 y.o. female who started having sharp, fleeting (about 1 second) pains in the left side of her chest starting about noon yesterday.  She describes it as feeling like indigestion.  The pains were not severe.  They waxed and waned throughout the afternoon.  She did take 2 aspirin around 4 PM and later Protonix tablet without relief.  About 7 PM while she was cooking dinner she developed an achy pain in her lower legs.  She then developed a pain on the left side of her neck which is tender to palpation.  She also developed these sharp, fleeting pains in her arms and other parts of her body.  She is also had intermittent numbness of the fingers of the left hand.  She denies associated shortness of breath.  There is not an exertional component associated with these symptoms.  Her symptoms have subsequently improved.   Past Medical History:  Diagnosis Date  . Allergy   . 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/2007  . ASTHMA 01/30/2007  . Asthma   . Breast tumor    right; benign  . Chest pain 01/24/2017  . GERD 01/31/2007  . Hemorrhoids   . Hiatal hernia   . HTN (hypertension) 09/09/2011  . Hyperlipidemia   . HYPOTHYROIDISM 01/30/2007  . Mallory Mariel Kansky tear 04/2003    Past Surgical History:  Procedure Laterality Date  . BREAST BIOPSY Right 12/2014  . BREAST EXCISIONAL BIOPSY Right 2016  . BREAST LUMPECTOMY WITH RADIOACTIVE SEED LOCALIZATION Right 02/25/2015   Procedure: BREAST LUMPECTOMY WITH RADIOACTIVE SEED LOCALIZATION;  Surgeon: Alphonsa Overall, MD;  Location: [facility];  Service: General;  Laterality: Right;  . BREAST REDUCTION SURGERY  1986  . BREAST SURGERY    . broken jaw    . CARPAL TUNNEL RELEASE  2001  . FRACTURE SURGERY    .  REDUCTION MAMMAPLASTY Bilateral   . THYROIDECTOMY    . TUBAL LIGATION  1987    Family History  Problem Relation Age of Onset  . Hypertension Father   . Diabetes Father   . Hyperlipidemia Father   . Asthma Mother   . Breast cancer Sister   . Thyroid disease Sister   . Diabetes Maternal Grandmother   . Heart disease Maternal Grandmother   . Heart attack Maternal Grandmother 60  . Asthma Maternal Grandfather   . Heart attack Maternal Grandfather   . Colon cancer Neg Hx     Social History   Tobacco Use  . Smoking status: Former Smoker    Packs/day: 0.25    Years: 3.00    Pack years: 0.75    Types: Cigarettes    Last attempt to quit: 06/04/1980    Years since quitting: 38.1  . Smokeless tobacco: Never Used  Substance Use Topics  . Alcohol use: No    Alcohol/week: 0.0 standard drinks  . Drug use: No    Prior to Admission medications   Medication Sig Start Date End Date Taking? Authorizing Provider  albuterol (PROVENTIL HFA;VENTOLIN HFA) 108 (90 Base) MCG/ACT inhaler Inhale 2 puffs into the lungs every 4 (four) hours as needed for wheezing or shortness of breath (cough, shortness of breath or wheezing.). 03/01/18  Yes Stallings, Zoe A, MD  amLODipine (NORVASC) 10 MG  tablet TAKE ONE TABLET BY MOUTH DAILY Patient taking differently: Take 10 mg by mouth daily.  05/12/18  Yes Billie Ruddy, MD  atorvastatin (LIPITOR) 40 MG tablet Take 1 tablet (40 mg total) by mouth daily. 09/28/17  Yes English, Colletta Maryland D, PA  cyclobenzaprine (FLEXERIL) 5 MG tablet Take 1 tablet (5 mg total) by mouth 3 (three) times daily as needed. Patient taking differently: Take 5 mg by mouth 3 (three) times daily as needed for muscle spasms.  12/27/17  Yes Jaynee Eagles, PA-C  EPIPEN 2-PAK 0.3 MG/0.3ML SOAJ injection Inject 0.3 mg as directed as needed for anaphylaxis. 11/21/16  Yes [provider]  levothyroxine (SYNTHROID, LEVOTHROID) 100 MCG tablet Take one tablet daily. Patient taking differently:  Take 100 mcg by mouth daily.  05/12/18  Yes Billie Ruddy, MD  meloxicam (MOBIC) 7.5 MG tablet Take 1 tablet (7.5 mg total) by mouth daily. Patient taking differently: Take 7.5 mg by mouth daily as needed for pain.  12/27/17  Yes Jaynee Eagles, PA-C  pantoprazole (PROTONIX) 40 MG tablet Take 1 tablet (40 mg total) by mouth 2 (two) times daily. 04/09/18  Yes Zehr, Janett Billow D, PA-C  polyethylene glycol powder (GLYCOLAX/MIRALAX) powder Take 17 g by mouth daily. Patient taking differently: Take 17 g by mouth daily as needed for mild constipation.  12/27/17  Yes Jaynee Eagles, PA-C  sucralfate (CARAFATE) 1 g tablet Take 1 tablet (1 g total) by mouth 4 (four) times daily -  with meals and at bedtime. 04/10/18 07/10/18 Yes Zehr, Laban Emperor, PA-C  triamcinolone (NASACORT) 55 MCG/ACT AERO nasal inhaler Place 1 spray into the nose daily.   Yes [provider]  polyvinyl alcohol (LIQUIFILM TEARS) 1.4 % ophthalmic solution Place 1 drop into both eyes at bedtime. Patient not taking: Reported on 07/10/2018 07/21/15   Nita Sells, MD    Allergies Promethazine-dm; [place] [fish allergy]; Codeine; and Flonase [fluticasone propionate]   REVIEW OF SYSTEMS  Negative except as noted here or in the History of Present Illness.   PHYSICAL EXAMINATION  Initial Vital Signs Blood pressure 117/86, pulse 77, resp. rate 16, last menstrual period 03/18/2014, SpO2 94 %.  Examination General: Well-developed, well-nourished female in no acute distress; appearance consistent with age of record HENT: normocephalic; atraumatic Eyes: pupils equal, round and reactive to light; extraocular muscles intact Neck: supple; left anterior muscle tenderness Heart: regular rate and rhythm; no murmur Lungs: clear to auscultation bilaterally Abdomen: soft; nondistended; nontender; bowel sounds present Extremities: No deformity; full range of motion; pulses normal; no edema Neurologic: Awake, alert and oriented; motor function  intact in all extremities and symmetric; no facial droop Skin: Warm and dry Psychiatric: Normal mood and affect   RESULTS  Summary of this visit's results, reviewed by myself:   EKG Interpretation  Date/Time:  Wednesday July 09 2018 22:01:07 EST Ventricular Rate:  79 PR Interval:    QRS Duration: 96 QT Interval:  400 QTC Calculation: 459 R Axis:   50 Text Interpretation:  Sinus rhythm Borderline prolonged PR interval Low voltage, precordial leads Baseline wander in lead(s) V2 Rate is slower Confirmed by Vihana Kydd [phone]) on 07/10/2018 1:04:32 AM      Laboratory Studies: Results for orders placed or performed during the hospital encounter of 07/09/18 (from the past 24 hour(s))  Basic metabolic panel     Status: Abnormal   Collection Time: 07/09/18 10:46 PM  Result Value Ref Range   Sodium 139 135 - 145 mmol/L   Potassium 3.3 (L)  3.5 - 5.1 mmol/L   Chloride 107 98 - 111 mmol/L   CO2 24 22 - 32 mmol/L   Glucose, Bld 138 (H) 70 - 99 mg/dL   BUN 14 6 - 20 mg/dL   Creatinine, Ser 0.86 0.44 - 1.00 mg/dL   Calcium 8.1 (L) 8.9 - 10.3 mg/dL   GFR calc non Af Amer >60 >60 mL/min   GFR calc Af Amer >60 >60 mL/min   Anion gap 8 5 - 15  CBC     Status: Abnormal   Collection Time: 07/09/18 10:46 PM  Result Value Ref Range   WBC 4.3 4.0 - 10.5 K/uL   RBC 4.30 3.87 - 5.11 MIL/uL   Hemoglobin 11.7 (L) 12.0 - 15.0 g/dL   HCT 36.9 36.0 - 46.0 %   MCV 85.8 80.0 - 100.0 fL   MCH 27.2 26.0 - 34.0 pg   MCHC 31.7 30.0 - 36.0 g/dL   RDW 12.9 11.5 - 15.5 %   Platelets 222 150 - 400 K/uL   nRBC 0.0 0.0 - 0.2 %  I-Stat beta hCG blood, ED     Status: None   Collection Time: 07/09/18 10:53 PM  Result Value Ref Range   I-stat hCG, quantitative <5.0 <5 mIU/mL   Comment 3          I-stat troponin, ED     Status: None   Collection Time: 07/09/18 10:54 PM  Result Value Ref Range   Troponin i, poc 0.00 0.00 - 0.08 ng/mL   Comment 3          I-stat troponin, ED     Status: None    Collection Time: 07/10/18  2:14 AM  Result Value Ref Range   Troponin i, poc 0.00 0.00 - 0.08 ng/mL   Comment 3           Imaging Studies: Dg Chest 2 View  Result Date: 07/09/2018 CLINICAL DATA:  53 year old female with chest pain radiating to the left arm and jaw. EXAM: CHEST - 2 VIEW COMPARISON:  Chest radiographs 03/01/2018 and earlier. FINDINGS: Stable somewhat low lung volumes. Stable mediastinal contours with mild tortuosity of the thoracic aorta. Visualized tracheal air column is within normal limits. No pneumothorax, pulmonary edema, pleural effusion or confluent pulmonary opacity. No acute osseous abnormality identified. Negative visible bowel gas pattern. IMPRESSION: No acute cardiopulmonary abnormality. Electronically Signed   By: Genevie Ann M.D.   On: 07/09/2018 22:20    ED COURSE and MDM  Nursing notes and initial vitals signs, including pulse oximetry, reviewed.  Vitals:   07/09/18 2209 07/09/18 2215 07/09/18 2230 07/10/18 0030  BP: 122/82   117/86  Pulse: 79 79 76 77  Resp: 18 20 20 16   SpO2: 98% 99% 97% 94%   2:41 AM Patient not currently having chest pain.  The pain she was having is not consistent with cardiac pain.  PROCEDURES    ED DIAGNOSES     ICD-10-CM   1. Atypical chest pain R07.89        Shanon Rosser, MD 07/10/18 [phone]

## 2018-08-19 ENCOUNTER — Encounter: Payer: Self-pay | Admitting: Family Medicine

## 2018-08-19 ENCOUNTER — Other Ambulatory Visit: Payer: Self-pay

## 2018-08-19 ENCOUNTER — Ambulatory Visit: Payer: BC Managed Care – PPO | Admitting: Family Medicine

## 2018-08-19 VITALS — BP 130/78 | HR 82 | Temp 97.8°F | Wt 202.0 lb

## 2018-08-19 DIAGNOSIS — K219 Gastro-esophageal reflux disease without esophagitis: Secondary | ICD-10-CM

## 2018-08-19 DIAGNOSIS — J0110 Acute frontal sinusitis, unspecified: Secondary | ICD-10-CM

## 2018-08-19 MED ORDER — AMOXICILLIN 500 MG PO CAPS: 500.0000 mg | ORAL_CAPSULE | Freq: Two times a day (BID) | ORAL | 0 refills | Status: AC

## 2018-08-19 NOTE — Patient Instructions (Addendum)
You can use Saline nasal spray for your nasal symptoms.   Sinusitis, Adult Sinusitis is inflammation of your sinuses. Sinuses are hollow spaces in the bones around your face. Your sinuses are located:  Around your eyes.  In the middle of your forehead.  Behind your nose.  In your cheekbones. Mucus normally drains out of your sinuses. When your nasal tissues become inflamed or swollen, mucus can become trapped or blocked. This allows bacteria, viruses, and fungi to grow, which leads to infection. Most infections of the sinuses are caused by a virus. Sinusitis can develop quickly. It can last for up to 4 weeks (acute) or for more than 12 weeks (chronic). Sinusitis often develops after a cold. What are the causes? This condition is caused by anything that creates swelling in the sinuses or stops mucus from draining. This includes:  Allergies.  Asthma.  Infection from bacteria or viruses.  Deformities or blockages in your nose or sinuses.  Abnormal growths in the nose (nasal polyps).  Pollutants, such as chemicals or irritants in the air.  Infection from fungi (rare). What increases the risk? You are more likely to develop this condition if you:  Have a weak body defense system (immune system).  Do a lot of swimming or diving.  Overuse nasal sprays.  Smoke. What are the signs or symptoms? The main symptoms of this condition are pain and a feeling of pressure around the affected sinuses. Other symptoms include:  Stuffy nose or congestion.  Thick drainage from your nose.  Swelling and warmth over the affected sinuses.  Headache.  Upper toothache.  A cough that may get worse at night.  Extra mucus that collects in the throat or the back of the nose (postnasal drip).  Decreased sense of smell and taste.  Fatigue.  A fever.  Sore throat.  Bad breath. How is this diagnosed? This condition is diagnosed based on:  Your symptoms.  Your medical history.  A  physical exam.  Tests to find out if your condition is acute or chronic. This may include: ? Checking your nose for nasal polyps. ? Viewing your sinuses using a device that has a light (endoscope). ? Testing for allergies or bacteria. ? Imaging tests, such as an MRI or CT scan. In rare cases, a bone biopsy may be done to rule out more serious types of fungal sinus disease. How is this treated? Treatment for sinusitis depends on the cause and whether your condition is chronic or acute.  If caused by a virus, your symptoms should go away on their own within 10 days. You may be given medicines to relieve symptoms. They include: ? Medicines that shrink swollen nasal passages (topical intranasal decongestants). ? Medicines that treat allergies (antihistamines). ? A spray that eases inflammation of the nostrils (topical intranasal corticosteroids). ? Rinses that help get rid of thick mucus in your nose (nasal saline washes).  If caused by bacteria, your health care provider may recommend waiting to see if your symptoms improve. Most bacterial infections will get better without antibiotic medicine. You may be given antibiotics if you have: ? A severe infection. ? A weak immune system.  If caused by narrow nasal passages or nasal polyps, you may need to have surgery. Follow these instructions at home: Medicines  Take, use, or apply over-the-counter and prescription medicines only as told by your health care provider. These may include nasal sprays.  If you were prescribed an antibiotic medicine, take it as told by your health  care provider. Do not stop taking the antibiotic even if you start to feel better. Hydrate and humidify   Drink enough fluid to keep your urine pale yellow. Staying hydrated will help to thin your mucus.  Use a cool mist humidifier to keep the humidity level in your home above 50%.  Inhale steam for 10-15 minutes, 3-4 times a day, or as told by your health care  provider. You can do this in the bathroom while a hot shower is running.  Limit your exposure to cool or dry air. Rest  Rest as much as possible.  Sleep with your head raised (elevated).  Make sure you get enough sleep each night. General instructions   Apply a warm, moist washcloth to your face 3-4 times a day or as told by your health care provider. This will help with discomfort.  Wash your hands often with soap and water to reduce your exposure to germs. If soap and water are not available, use hand sanitizer.  Do not smoke. Avoid being around people who are smoking (secondhand smoke).  Keep all follow-up visits as told by your health care provider. This is important. Contact a health care provider if:  You have a fever.  Your symptoms get worse.  Your symptoms do not improve within 10 days. Get help right away if:  You have a severe headache.  You have persistent vomiting.  You have severe pain or swelling around your face or eyes.  You have vision problems.  You develop confusion.  Your neck is stiff.  You have trouble breathing. Summary  Sinusitis is soreness and inflammation of your sinuses. Sinuses are hollow spaces in the bones around your face.  This condition is caused by nasal tissues that become inflamed or swollen. The swelling traps or blocks the flow of mucus. This allows bacteria, viruses, and fungi to grow, which leads to infection.  If you were prescribed an antibiotic medicine, take it as told by your health care provider. Do not stop taking the antibiotic even if you start to feel better.  Keep all follow-up visits as told by your health care provider. This is important. This information is not intended to replace advice given to you by your health care provider. Make sure you discuss any questions you have with your health care provider. Document Released: 05/21/2005 Document Revised: 10/21/2017 Document Reviewed: 10/21/2017 Elsevier  Interactive Patient Education  2019 [place] for Gastroesophageal Reflux Disease, Adult When you have gastroesophageal reflux disease (GERD), the foods you eat and your eating habits are very important. Choosing the right foods can help ease your discomfort. Think about working with a nutrition specialist (dietitian) to help you make good choices. What are tips for following this plan?  Meals  Choose healthy foods that are low in fat, such as fruits, vegetables, whole grains, low-fat dairy products, and lean meat, fish, and poultry.  Eat small meals often instead of 3 large meals a day. Eat your meals slowly, and in a place where you are relaxed. Avoid bending over or lying down until 2-3 hours after eating.  Avoid eating meals 2-3 hours before bed.  Avoid drinking a lot of liquid with meals.  Cook foods using methods other than frying. Bake, grill, or broil food instead.  Avoid or limit: ? Chocolate. ? Peppermint or spearmint. ? Alcohol. ? Pepper. ? Black and decaffeinated coffee. ? Black and decaffeinated tea. ? Bubbly (carbonated) soft drinks. ? Caffeinated energy drinks and soft  drinks.  Limit high-fat foods such as: ? Fatty meat or fried foods. ? Whole milk, cream, butter, or ice cream. ? Nuts and nut butters. ? Pastries, donuts, and sweets made with butter or shortening.  Avoid foods that cause symptoms. These foods may be different for everyone. Common foods that cause symptoms include: ? Tomatoes. ? Oranges, lemons, and limes. ? Peppers. ? Spicy food. ? Onions and garlic. ? Vinegar. Lifestyle  Maintain a healthy weight. Ask your doctor what weight is healthy for you. If you need to lose weight, work with your doctor to do so safely.  Exercise for at least 30 minutes for 5 or more days each week, or as told by your doctor.  Wear loose-fitting clothes.  Do not smoke. If you need help quitting, ask your doctor.  Sleep with the head of your  bed higher than your feet. Use a wedge under the mattress or blocks under the bed frame to raise the head of the bed. Summary  When you have gastroesophageal reflux disease (GERD), food and lifestyle choices are very important in easing your symptoms.  Eat small meals often instead of 3 large meals a day. Eat your meals slowly, and in a place where you are relaxed.  Limit high-fat foods such as fatty meat or fried foods.  Avoid bending over or lying down until 2-3 hours after eating.  Avoid peppermint and spearmint, caffeine, alcohol, and chocolate. This information is not intended to replace advice given to you by your health care provider. Make sure you discuss any questions you have with your health care provider. Document Released: 11/20/2011 Document Revised: 06/26/2016 Document Reviewed: 06/26/2016 Elsevier Interactive Patient Education  2019 [employer].  Exercising to [employer] Exercise is structured, repetitive physical activity to improve fitness and health. Getting regular exercise is important for everyone. It is especially important if you are overweight. Being overweight increases your risk of heart disease, stroke, diabetes, high blood pressure, and several types of cancer. Reducing your calorie intake and exercising can help you lose weight. Exercise is usually categorized as moderate or vigorous intensity. To lose weight, most people need to do a certain amount of moderate-intensity or vigorous-intensity exercise each week. Moderate-intensity exercise  Moderate-intensity exercise is any activity that gets you moving enough to burn at least three times more energy (calories) than if you were sitting. Examples of moderate exercise include:  Walking a mile in 15 minutes.  Doing light yard work.  Biking at an easy pace. Most people should get at least 150 minutes (2 hours and 30 minutes) a week of moderate-intensity exercise to maintain their body  weight. Vigorous-intensity exercise Vigorous-intensity exercise is any activity that gets you moving enough to burn at least six times more calories than if you were sitting. When you exercise at this intensity, you should be working hard enough that you are not able to carry on a conversation. Examples of vigorous exercise include:  Running.  Playing a team sport, such as football, basketball, and soccer.  Jumping rope. Most people should get at least 75 minutes (1 hour and 15 minutes) a week of vigorous-intensity exercise to maintain their body weight. How can exercise affect me? When you exercise enough to burn more calories than you eat, you lose weight. Exercise also reduces body fat and builds muscle. The more muscle you have, the more calories you burn. Exercise also:  Improves mood.  Reduces stress and tension.  Improves your overall fitness, flexibility, and endurance.  Increases bone strength. The amount of exercise you need to lose weight depends on:  Your age.  The type of exercise.  Any health conditions you have.  Your overall physical ability. Talk to your health care provider about how much exercise you need and what types of activities are safe for you. What actions can I take to lose weight? Nutrition   Make changes to your diet as told by your health care provider or diet and nutrition specialist (dietitian). This may include: ? Eating fewer calories. ? Eating more protein. ? Eating less unhealthy fats. ? Eating a diet that includes fresh fruits and vegetables, whole grains, low-fat dairy products, and lean protein. ? Avoiding foods with added fat, salt, and sugar.  Drink plenty of water while you exercise to prevent dehydration or heat stroke. Activity  Choose an activity that you enjoy and set realistic goals. Your health care provider can help you make an exercise plan that works for you.  Exercise at a moderate or vigorous intensity most days of  the week. ? The intensity of exercise may vary from person to person. You can tell how intense a workout is for you by paying attention to your breathing and heartbeat. Most people will notice their breathing and heartbeat get faster with more intense exercise.  Do resistance training twice each week, such as: ? Push-ups. ? Sit-ups. ? Lifting weights. ? Using resistance bands.  Getting short amounts of exercise can be just as helpful as long structured periods of exercise. If you have trouble finding time to exercise, try to include exercise in your daily routine. ? Get up, stretch, and walk around every 30 minutes throughout the day. ? Go for a walk during your lunch break. ? Park your car farther away from your destination. ? If you take public transportation, get off one stop early and walk the rest of the way. ? Make phone calls while standing up and walking around. ? Take the stairs instead of elevators or escalators.  Wear comfortable clothes and shoes with good support.  Do not exercise so much that you hurt yourself, feel dizzy, or get very short of breath. Where to find more information  [place] Department of Health and Human Services: [employer]  Centers for Disease Control and Prevention (CDC): http://www.wolf.info/ Contact a health care provider:  Before starting a new exercise program.  If you have questions or concerns about your weight.  If you have a medical problem that keeps you from exercising. Get help right away if you have any of the following while exercising:  Injury.  Dizziness.  Difficulty breathing or shortness of breath that does not go away when you stop exercising.  Chest pain.  Rapid heartbeat. Summary  Being overweight increases your risk of heart disease, stroke, diabetes, high blood pressure, and several types of cancer.  Losing weight happens when you burn more calories than you eat.  Reducing the amount of calories you eat in addition to  getting regular moderate or vigorous exercise each week helps you lose weight. This information is not intended to replace advice given to you by your health care provider. Make sure you discuss any questions you have with your health care provider. Document Released: 06/23/2010 Document Revised: 06/03/2017 Document Reviewed: 06/03/2017 Elsevier Interactive Patient Education  2019 [place] for [employer] Calories are units of energy. Your body needs a certain amount of calories from food to keep you going throughout the day. When you  eat more calories than your body needs, your body stores the extra calories as fat. When you eat fewer calories than your body needs, your body burns fat to get the energy it needs. Calorie counting means keeping track of how many calories you eat and drink each day. Calorie counting can be helpful if you need to lose weight. If you make sure to eat fewer calories than your body needs, you should lose weight. Ask your health care provider what a healthy weight is for you. For calorie counting to work, you will need to eat the right number of calories in a day in order to lose a healthy amount of weight per week. A dietitian can help you determine how many calories you need in a day and will give you suggestions on how to reach your calorie goal.  A healthy amount of weight to lose per week is usually 1-2 lb (0.5-0.9 kg). This usually means that your daily calorie intake should be reduced by 500-750 calories.  Eating 1,200 - 1,500 calories per day can help most women lose weight.  Eating 1,500 - 1,800 calories per day can help most men lose weight. What is my plan? My goal is to have __________ calories per day. If I have this many calories per day, I should lose around __________ pounds per week. What do I need to know about calorie counting? In order to meet your daily calorie goal, you will need to:  Find out how many calories are in each  food you would like to eat. Try to do this before you eat.  Decide how much of the food you plan to eat.  Write down what you ate and how many calories it had. Doing this is called keeping a food log. To successfully lose weight, it is important to balance calorie counting with a healthy lifestyle that includes regular activity. Aim for 150 minutes of moderate exercise (such as walking) or 75 minutes of vigorous exercise (such as running) each week. Where do I find calorie information?  The number of calories in a food can be found on a Nutrition Facts label. If a food does not have a Nutrition Facts label, try to look up the calories online or ask your dietitian for help. Remember that calories are listed per serving. If you choose to have more than one serving of a food, you will have to multiply the calories per serving by the amount of servings you plan to eat. For example, the label on a package of bread might say that a serving size is 1 slice and that there are 90 calories in a serving. If you eat 1 slice, you will have eaten 90 calories. If you eat 2 slices, you will have eaten 180 calories. How do I keep a food log? Immediately after each meal, record the following information in your food log:  What you ate. Don't forget to include toppings, sauces, and other extras on the food.  How much you ate. This can be measured in cups, ounces, or number of items.  How many calories each food and drink had.  The total number of calories in the meal. Keep your food log near you, such as in a small notebook in your pocket, or use a mobile app or website. Some programs will calculate calories for you and show you how many calories you have left for the day to meet your goal. What are some calorie counting tips?   Use your  calories on foods and drinks that will fill you up and not leave you hungry: ? Some examples of foods that fill you up are nuts and nut butters, vegetables, lean proteins, and  high-fiber foods like whole grains. High-fiber foods are foods with more than 5 g fiber per serving. ? Drinks such as sodas, specialty coffee drinks, alcohol, and juices have a lot of calories, yet do not fill you up.  Eat nutritious foods and avoid empty calories. Empty calories are calories you get from foods or beverages that do not have many vitamins or protein, such as candy, sweets, and soda. It is better to have a nutritious high-calorie food (such as an avocado) than a food with few nutrients (such as a bag of chips).  Know how many calories are in the foods you eat most often. This will help you calculate calorie counts faster.  Pay attention to calories in drinks. Low-calorie drinks include water and unsweetened drinks.  Pay attention to nutrition labels for "low fat" or "fat free" foods. These foods sometimes have the same amount of calories or more calories than the full fat versions. They also often have added sugar, starch, or salt, to make up for flavor that was removed with the fat.  Find a way of tracking calories that works for you. Get creative. Try different apps or programs if writing down calories does not work for you. What are some portion control tips?  Know how many calories are in a serving. This will help you know how many servings of a certain food you can have.  Use a measuring cup to measure serving sizes. You could also try weighing out portions on a kitchen scale. With time, you will be able to estimate serving sizes for some foods.  Take some time to put servings of different foods on your favorite plates, bowls, and cups so you know what a serving looks like.  Try not to eat straight from a bag or box. Doing this can lead to overeating. Put the amount you would like to eat in a cup or on a plate to make sure you are eating the right portion.  Use smaller plates, glasses, and bowls to prevent overeating.  Try not to multitask (for example, watch TV or use  your computer) while eating. If it is time to eat, sit down at a table and enjoy your food. This will help you to know when you are full. It will also help you to be aware of what you are eating and how much you are eating. What are tips for following this plan? Reading food labels  Check the calorie count compared to the serving size. The serving size may be smaller than what you are used to eating.  Check the source of the calories. Make sure the food you are eating is high in vitamins and protein and low in saturated and trans fats. Shopping  Read nutrition labels while you shop. This will help you make healthy decisions before you decide to purchase your food.  Make a grocery list and stick to it. Cooking  Try to cook your favorite foods in a healthier way. For example, try baking instead of frying.  Use low-fat dairy products. Meal planning  Use more fruits and vegetables. Half of your plate should be fruits and vegetables.  Include lean proteins like poultry and fish. How do I count calories when eating out?  Ask for smaller portion sizes.  Consider sharing an entree  and sides instead of getting your own entree.  If you get your own entree, eat only half. Ask for a box at the beginning of your meal and put the rest of your entree in it so you are not tempted to eat it.  If calories are listed on the menu, choose the lower calorie options.  Choose dishes that include vegetables, fruits, whole grains, low-fat dairy products, and lean protein.  Choose items that are boiled, broiled, grilled, or steamed. Stay away from items that are buttered, battered, fried, or served with cream sauce. Items labeled "crispy" are usually fried, unless stated otherwise.  Choose water, low-fat milk, unsweetened iced tea, or other drinks without added sugar. If you want an alcoholic beverage, choose a lower calorie option such as a glass of wine or light beer.  Ask for dressings, sauces, and  syrups on the side. These are usually high in calories, so you should limit the amount you eat.  If you want a salad, choose a garden salad and ask for grilled meats. Avoid extra toppings like bacon, cheese, or fried items. Ask for the dressing on the side, or ask for olive oil and vinegar or lemon to use as dressing.  Estimate how many servings of a food you are given. For example, a serving of cooked rice is  cup or about the size of half a baseball. Knowing serving sizes will help you be aware of how much food you are eating at restaurants. The list below tells you how big or small some common portion sizes are based on everyday objects: ? 1 oz-4 stacked dice. ? 3 oz-1 deck of cards. ? 1 tsp-1 die. ? 1 Tbsp- a ping-pong ball. ? 2 Tbsp-1 ping-pong ball. ?  cup- baseball. ? 1 cup-1 baseball. Summary  Calorie counting means keeping track of how many calories you eat and drink each day. If you eat fewer calories than your body needs, you should lose weight.  A healthy amount of weight to lose per week is usually 1-2 lb (0.5-0.9 kg). This usually means reducing your daily calorie intake by 500-750 calories.  The number of calories in a food can be found on a Nutrition Facts label. If a food does not have a Nutrition Facts label, try to look up the calories online or ask your dietitian for help.  Use your calories on foods and drinks that will fill you up, and not on foods and drinks that will leave you hungry.  Use smaller plates, glasses, and bowls to prevent overeating. This information is not intended to replace advice given to you by your health care provider. Make sure you discuss any questions you have with your health care provider. Document Released: 05/21/2005 Document Revised: 02/07/2018 Document Reviewed: 04/20/2016 Elsevier Interactive Patient Education  2019 [employer].

## 2018-08-19 NOTE — Progress Notes (Signed)
Subjective:    Patient ID: Marilyn [name], female    DOB: 1965/08/02, 53 y.o.   MRN: [id number]  No chief complaint on file.   HPI Patient was seen today for acute concern.  Pt endorses feeling "stuffed up" and an echo in her ears x1 week.  Pt tried over-the-counter medications.  Denies fever, chills, nausea, vomiting, cough, sore throat.  Also notes ongoing constipation, BMs mostly pellets.  Drinking 3-4 bottles of water per day.  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  Pt with h/o GERD, taking Protonix 40 mg twice daily.  States is tried to eat better.  May have pizza 1 time per month.  Using air prior increasing intake of vegetables.  Past Medical History:  Diagnosis Date  . Allergy   . 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/2007  . ASTHMA 01/30/2007  . Asthma   . Breast tumor    right; benign  . Chest pain 01/24/2017  . GERD 01/31/2007  . Hemorrhoids   . Hiatal hernia   . HTN (hypertension) 09/09/2011  . Hyperlipidemia   . HYPOTHYROIDISM 01/30/2007  . [place] tear 04/2003    Allergies  Allergen Reactions  . Promethazine-Dm   . Tuna [Fish Allergy] Anaphylaxis  . Codeine Nausea And Vomiting  . Flonase [Fluticasone Propionate] Other (See Comments)    Nose bleeds    ROS General: Denies fever, chills, night sweats, changes in weight, changes in appetite HEENT: Denies headaches, ear pain, changes in vision, rhinorrhea, sore throat  +nasal conagestion/sinus congestion, echo in ears, throat hurting CV: Denies CP, palpitations, SOB, orthopnea Pulm: Denies SOB, cough, wheezing GI: Denies abdominal pain, nausea, vomiting, diarrhea, constipation GU: Denies dysuria, hematuria, frequency, vaginal discharge Msk: Denies muscle cramps, joint pains Neuro: Denies weakness, numbness, tingling Skin: Denies rashes, bruising Psych: Denies depression, anxiety, hallucinations     Objective:    Blood pressure 130/78, pulse 82, temperature 97.8 F (36.6 C), temperature  source Oral, weight 202 lb (91.6 kg), last menstrual period 03/18/2014, SpO2 98 %.   Gen. Pleasant, well-nourished, in no distress, normal affect   HEENT: [place]/AT, face symmetric, conjunctiva clear, no scleral icterus, PERRLA, TTP of frontal sinuses and L maxillary sinus.  Nares patent without drainage, pharynx without erythema or exudate.  TMs full b/l.  No cervical lymphadenopathy Lungs: no accessory muscle use, CTAB, no wheezes or rales Cardiovascular: RRR, no m/r/g, no peripheral edema Neuro:  A&Ox3, CN II-XII intact, normal gait Skin:  Warm, no lesions/ rash  Wt Readings from Last 3 Encounters:  08/19/18 202 lb (91.6 kg)  05/12/18 200 lb (90.7 kg)  04/11/18 200 lb (90.7 kg)    Lab Results  Component Value Date   WBC 4.3 07/09/2018   HGB 11.7 (L) 07/09/2018   HCT 36.9 07/09/2018   PLT 222 07/09/2018   GLUCOSE 138 (H) 07/09/2018   CHOL 150 12/27/2017   TRIG 248 (H) 12/27/2017   HDL 38 (L) 12/27/2017   LDLDIRECT 165.3 12/15/2012   LDLCALC 62 12/27/2017   ALT 30 09/28/2017   AST 31 09/28/2017   NA 139 07/09/2018   K 3.3 (L) 07/09/2018   CL 107 07/09/2018   CREATININE 0.86 07/09/2018   BUN 14 07/09/2018   CO2 24 07/09/2018   TSH 1.63 05/12/2018   HGBA1C 6.3 (H) 12/27/2017    Assessment/Plan:  Acute non-recurrent frontal sinusitis  -given handout -ok to use saline nasal spray - Plan: amoxicillin (AMOXIL) 500 MG capsule  Gastroesophageal reflux disease, esophagitis presence not  specified -continue protonix 40 mg BID -advised to avoid foods known to cause symptoms  F/u prn  Grier Mitts, MD

## 2018-08-21 ENCOUNTER — Encounter: Payer: Self-pay | Admitting: Family Medicine

## 2018-08-25 ENCOUNTER — Telehealth: Payer: Self-pay | Admitting: Family Medicine

## 2018-08-25 NOTE — Telephone Encounter (Signed)
Patient needs a note for work that states she is asthmatic.  It is ok to send it as a letter in patient's MyChart.

## 2018-08-27 NOTE — Telephone Encounter (Signed)
Please advise 

## 2018-08-28 NOTE — Telephone Encounter (Signed)
Unclear why pt needs the note.  Unable to write a note so pt can get out of work, especially if she is asymptomatic.

## 2018-08-29 NOTE — Telephone Encounter (Signed)
Spoke with pt states that she got a note on pt portal. Nothing further needed

## 2018-09-16 ENCOUNTER — Telehealth: Payer: Self-pay

## 2018-09-16 NOTE — Telephone Encounter (Signed)
Spoke with pt stated that she received the work note on the patient portal

## 2018-09-17 ENCOUNTER — Telehealth: Payer: Self-pay | Admitting: Gastroenterology

## 2018-09-17 NOTE — Telephone Encounter (Signed)
Pt requested a call back regarding acid reflux medication that she had discussed with her PCP.  Pt refused to give any further information.

## 2018-09-17 NOTE — Telephone Encounter (Signed)
Called patient with no answer and voice mail box is full and cannot leave a message.

## 2018-09-22 MED ORDER — OMEPRAZOLE 40 MG PO CPDR: 40.0000 mg | DELAYED_RELEASE_CAPSULE | Freq: Two times a day (BID) | ORAL | 11 refills | Status: DC

## 2018-09-22 NOTE — Telephone Encounter (Signed)
Patient states she wants to be switched to another acid reflux medication. Patient states she was told by Dr. Lucia Gaskins (ENT) to switch PPI's to see if this helps her esophageal pain. Informed patient I can switch her medicine but she needs to schedule a appt with Dr. Fuller Plan to discuss. Patient states she wants to try a different PPI first and call back in a few weeks and schedule a appt if needed.

## 2018-10-04 ENCOUNTER — Other Ambulatory Visit: Payer: Self-pay

## 2018-10-04 ENCOUNTER — Emergency Department (HOSPITAL_COMMUNITY): Payer: BC Managed Care – PPO

## 2018-10-04 ENCOUNTER — Encounter (HOSPITAL_COMMUNITY): Payer: Self-pay

## 2018-10-04 ENCOUNTER — Emergency Department (HOSPITAL_COMMUNITY)
Admission: EM | Admit: 2018-10-04 | Discharge: 2018-10-04 | Disposition: A | Discharge status: Home / Self Care | Payer: BC Managed Care – PPO | Attending: Emergency Medicine | Admitting: Emergency Medicine

## 2018-10-04 DIAGNOSIS — R079 Chest pain, unspecified: Secondary | ICD-10-CM

## 2018-10-04 DIAGNOSIS — Z79899 Other long term (current) drug therapy: Secondary | ICD-10-CM | POA: Diagnosis not present

## 2018-10-04 DIAGNOSIS — Z87891 Personal history of nicotine dependence: Secondary | ICD-10-CM | POA: Insufficient documentation

## 2018-10-04 DIAGNOSIS — E039 Hypothyroidism, unspecified: Secondary | ICD-10-CM | POA: Diagnosis not present

## 2018-10-04 DIAGNOSIS — I10 Essential (primary) hypertension: Secondary | ICD-10-CM | POA: Diagnosis not present

## 2018-10-04 DIAGNOSIS — J45909 Unspecified asthma, uncomplicated: Secondary | ICD-10-CM | POA: Diagnosis not present

## 2018-10-04 LAB — CBC
HCT: 39.4 % (ref 36.0–46.0)
Hemoglobin: 12.4 g/dL (ref 12.0–15.0)
MCH: 27 pg (ref 26.0–34.0)
MCHC: 31.5 g/dL (ref 30.0–36.0)
MCV: 85.7 fL (ref 80.0–100.0)
Platelets: 266 10*3/uL (ref 150–400)
RBC: 4.6 MIL/uL (ref 3.87–5.11)
RDW: 13.1 % (ref 11.5–15.5)
WBC: 4.1 10*3/uL (ref 4.0–10.5)
nRBC: 0 % (ref 0.0–0.2)

## 2018-10-04 LAB — BASIC METABOLIC PANEL
Anion gap: 10 (ref 5–15)
BUN: 14 mg/dL (ref 6–20)
CO2: 26 mmol/L (ref 22–32)
Calcium: 9 mg/dL (ref 8.9–10.3)
Chloride: 103 mmol/L (ref 98–111)
Creatinine, Ser: 0.94 mg/dL (ref 0.44–1.00)
GFR calc Af Amer: >60 mL/min (ref >60)
GFR calc non Af Amer: >60 mL/min (ref >60)
Glucose, Bld: 101 mg/dL — ABNORMAL HIGH (ref 70–99)
Potassium: 3.7 mmol/L (ref 3.5–5.1)
Sodium: 139 mmol/L (ref 135–145)

## 2018-10-04 LAB — I-STAT BETA HCG BLOOD, ED (MC, WL, AP ONLY): I-stat hCG, quantitative: <5 m[IU]/mL (ref <5)

## 2018-10-04 LAB — TROPONIN I: Troponin I: <0.03 ng/mL (ref <0.03)

## 2018-10-04 MED ORDER — SODIUM CHLORIDE 0.9% FLUSH: 3.0000 mL | Freq: Once | INTRAVENOUS | Status: DC

## 2018-10-04 MED ORDER — LIDOCAINE VISCOUS HCL 2 % MT SOLN
15.0000 mL | Freq: Once | OROMUCOSAL | Status: AC
  Administered 2018-10-04: 20:00:00 15 mL via ORAL
  Filled 2018-10-04: qty 15

## 2018-10-04 MED ORDER — ALUM & MAG HYDROXIDE-SIMETH 200-200-20 MG/5ML PO SUSP
30.0000 mL | Freq: Once | ORAL | Status: AC
  Administered 2018-10-04: 20:00:00 30 mL via ORAL
  Filled 2018-10-04: qty 30

## 2018-10-04 NOTE — ED Triage Notes (Signed)
States chest fluttering and chest pressure states blood pressure is up and dizziness.

## 2018-10-04 NOTE — ED Notes (Signed)
Patient transported to X-ray 

## 2018-10-04 NOTE — ED Provider Notes (Addendum)
[facility] DEPT Provider Note   CSN: [id number] Arrival date & time: 10/04/18  1943    History   Chief Complaint Chief Complaint  Patient presents with  . Chest Pain    HPI Marilyn [name] is a 53 y.o. female.     53 year old female presents with remittent upper abdominal discomfort characterizes indigestion.  Symptoms do not radiate into her back or up into her neck.  No exertional component to them.  Started this morning and have been waxing and waning.  They do get relieved with burping.  Patient also notes that she has had increased blood pressure at home with a systolic of in the 409W diastolic in the low 119J.  States that she took himself off of 1 of her antihypertensives last year and has not resumed it.  Notes recent changes to diet to include more foods with sodium.  Denies any recent fever, cough.  No medications used prior to arrival.     Past Medical History:  Diagnosis Date  . Allergy   . 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/2007  . ASTHMA 01/30/2007  . Asthma   . Breast tumor    right; benign  . Chest pain 01/24/2017  . GERD 01/31/2007  . Hemorrhoids   . Hiatal hernia   . HTN (hypertension) 09/09/2011  . Hyperlipidemia   . HYPOTHYROIDISM 01/30/2007  . [place] tear 04/2003    Patient Active Problem List   Diagnosis Date Noted  . Swollen neck 04/09/2018  . Urinary retention 08/05/2017  . Atypical chest pain 01/23/2017  . Nausea & vomiting 07/16/2015  . RUQ abdominal pain 07/16/2015  . RLQ abdominal pain 07/15/2015  . Pain in lower back 11/30/2014  . Acute upper respiratory infection 05/05/2014  . Allergic rhinitis 05/05/2014  . Hoarseness 05/05/2014  . Dysphagia 05/05/2014  . Obesity 04/11/2014  . Edema, peripheral 04/11/2014  . Cough 12/20/2012  . Hypersomnolence 12/15/2012  . DOE (dyspnea on exertion) 12/15/2012  . Right shoulder pain 12/15/2012  . Abdominal pain 11/15/2011  . HTN (hypertension) 09/09/2011   . Neck pain 09/09/2011  . Preventative health care 04/30/2011  . HOT FLASHES 12/22/2009  . CHEST PAIN 01/29/2009  . Acute non-recurrent maxillary sinusitis 09/25/2008  . HYPERTHYROIDISM 01/31/2007  . GERD 01/31/2007  . Hypothyroidism 01/30/2007  . Hyperlipidemia 01/30/2007  . Iron deficiency anemia 01/30/2007  . ASTHMA 01/30/2007    Past Surgical History:  Procedure Laterality Date  . BREAST BIOPSY Right 12/2014  . BREAST EXCISIONAL BIOPSY Right 2016  . BREAST LUMPECTOMY WITH RADIOACTIVE SEED LOCALIZATION Right 02/25/2015   Procedure: BREAST LUMPECTOMY WITH RADIOACTIVE SEED LOCALIZATION;  Surgeon: Alphonsa Overall, MD;  Location: [facility];  Service: General;  Laterality: Right;  . BREAST REDUCTION SURGERY  1986  . BREAST SURGERY    . broken jaw    . CARPAL TUNNEL RELEASE  2001  . FRACTURE SURGERY    . REDUCTION MAMMAPLASTY Bilateral   . THYROIDECTOMY    . TUBAL LIGATION  1987     OB History   No obstetric history on file.      Home Medications    Prior to Admission medications   Medication Sig Start Date End Date Taking? Authorizing Provider  albuterol (PROVENTIL HFA;VENTOLIN HFA) 108 (90 Base) MCG/ACT inhaler Inhale 2 puffs into the lungs every 4 (four) hours as needed for wheezing or shortness of breath (cough, shortness of breath or wheezing.). 03/01/18   Forrest Moron, MD  amLODipine (NORVASC) 10  MG tablet TAKE ONE TABLET BY MOUTH DAILY Patient taking differently: Take 10 mg by mouth daily.  05/12/18   Billie Ruddy, MD  atorvastatin (LIPITOR) 40 MG tablet Take 1 tablet (40 mg total) by mouth daily. 09/28/17   Ivar Drape D, PA  cyclobenzaprine (FLEXERIL) 5 MG tablet Take 1 tablet (5 mg total) by mouth 3 (three) times daily as needed. Patient taking differently: Take 5 mg by mouth 3 (three) times daily as needed for muscle spasms.  12/27/17   Jaynee Eagles, PA-C  EPIPEN 2-PAK 0.3 MG/0.3ML SOAJ injection Inject 0.3 mg as directed as needed for  anaphylaxis. 11/21/16   [provider]  levothyroxine (SYNTHROID, LEVOTHROID) 100 MCG tablet Take one tablet daily. Patient taking differently: Take 100 mcg by mouth daily.  05/12/18   Billie Ruddy, MD  meloxicam (MOBIC) 7.5 MG tablet Take 1 tablet (7.5 mg total) by mouth daily. Patient taking differently: Take 7.5 mg by mouth daily as needed for pain.  12/27/17   Jaynee Eagles, PA-C  omeprazole (PRILOSEC) 40 MG capsule Take 1 capsule (40 mg total) by mouth 2 (two) times a day. 09/22/18   Ladene Artist, MD  polyethylene glycol powder (GLYCOLAX/MIRALAX) powder Take 17 g by mouth daily. Patient taking differently: Take 17 g by mouth daily as needed for mild constipation.  12/27/17   Jaynee Eagles, PA-C  sucralfate (CARAFATE) 1 g tablet Take 1 tablet (1 g total) by mouth 4 (four) times daily -  with meals and at bedtime. 04/10/18 07/10/18  Zehr, Janett Billow D, PA-C  triamcinolone (NASACORT) 55 MCG/ACT AERO nasal inhaler Place 1 spray into the nose daily.    [provider]    Family History Family History  Problem Relation Age of Onset  . Hypertension Father   . Diabetes Father   . Hyperlipidemia Father   . Asthma Mother   . Breast cancer Sister   . Thyroid disease Sister   . Diabetes Maternal Grandmother   . Heart disease Maternal Grandmother   . Heart attack Maternal Grandmother 60  . Asthma Maternal Grandfather   . Heart attack Maternal Grandfather   . Colon cancer Neg Hx     Social History Social History   Tobacco Use  . Smoking status: Former Smoker    Packs/day: 0.25    Years: 3.00    Pack years: 0.75    Types: Cigarettes    Last attempt to quit: 06/04/1980    Years since quitting: 38.3  . Smokeless tobacco: Never Used  Substance Use Topics  . Alcohol use: No    Alcohol/week: 0.0 standard drinks  . Drug use: No     Allergies   Promethazine-dm; [place] [fish allergy]; Codeine; and Flonase [fluticasone propionate]   Review of Systems Review of Systems  All  other systems reviewed and are negative.    Physical Exam Updated Vital Signs BP (!) 138/101   Pulse 100   Temp 98.8 F (37.1 C) (Oral)   Resp 16   Ht 1.6 m (5\' 3" )   Wt 90.7 kg   LMP 03/18/2014   SpO2 100%   BMI 35.43 kg/m   Physical Exam Vitals signs and nursing note reviewed.  Constitutional:      General: She is not in acute distress.    Appearance: Normal appearance. She is well-developed. She is not toxic-appearing.  HENT:     Head: Normocephalic and atraumatic.  Eyes:     General: Lids are normal.  Conjunctiva/sclera: Conjunctivae normal.     Pupils: Pupils are equal, round, and reactive to light.  Neck:     Musculoskeletal: Normal range of motion and neck supple.     Thyroid: No thyroid mass.     Trachea: No tracheal deviation.  Cardiovascular:     Rate and Rhythm: Regular rhythm. Tachycardia present.     Heart sounds: Normal heart sounds. No murmur. No gallop.   Pulmonary:     Effort: Pulmonary effort is normal. No respiratory distress.     Breath sounds: Normal breath sounds. No stridor. No decreased breath sounds, wheezing, rhonchi or rales.  Abdominal:     General: Bowel sounds are normal. There is no distension.     Palpations: Abdomen is soft.     Tenderness: There is no abdominal tenderness. There is no rebound.  Musculoskeletal: Normal range of motion.        General: No tenderness.  Skin:    General: Skin is warm and dry.     Findings: No abrasion or rash.  Neurological:     Mental Status: She is alert and oriented to person, place, and time.     GCS: GCS eye subscore is 4. GCS verbal subscore is 5. GCS motor subscore is 6.     Cranial Nerves: No cranial nerve deficit.     Sensory: No sensory deficit.  Psychiatric:        Speech: Speech normal.        Behavior: Behavior normal.      ED Treatments / Results  Labs (all labs ordered are listed, but only abnormal results are displayed) Labs Reviewed  BASIC METABOLIC PANEL  CBC   TROPONIN I  I-STAT BETA HCG BLOOD, ED ([facility], [facility], [facility] ONLY)    EKG EKG Interpretation  Date/Time:  Saturday Oct 04 2018 20:06:43 EDT Ventricular Rate:  104 PR Interval:    QRS Duration: 67 QT Interval:  353 QTC Calculation: 467 R Axis:   55 Text Interpretation:  Sinus tachycardia Low voltage, precordial leads Minimal ST depression Baseline wander in lead(s) V3 Confirmed by Lacretia Leigh ([id number]) on 10/04/2018 8:13:07 PM   Radiology No results found.  Procedures Procedures (including critical care time)  Medications Ordered in ED Medications  sodium chloride flush (NS) 0.9 % injection 3 mL (has no administration in time range)  alum & mag hydroxide-simeth (MAALOX/MYLANTA) 200-200-20 MG/5ML suspension 30 mL (has no administration in time range)    And  lidocaine (XYLOCAINE) 2 % viscous mouth solution 15 mL (has no administration in time range)     Initial Impression / Assessment and Plan / ED Course  I have reviewed the triage vital signs and the nursing notes.  Pertinent labs & imaging results that were available during my care of the patient were reviewed by me and considered in my medical decision making (see chart for details).        Patient's EKG without acute ischemic changes.  Patient had a cardiac nuclear study done 2 years ago which did not show any findings that required intervention.  Troponin here negative.  She has had symptoms since earlier this morning.  They have been atypical in that they last for seconds and are relieved with burping.  Suspect GERD.  Blood pressure here has been stable.  Patient started to follow-up with her doctor  Final Clinical Impressions(s) / ED Diagnoses   Final diagnoses:  None    ED Discharge Orders    None  Lacretia Leigh, MD 10/04/18 2209    Lacretia Leigh, MD 10/04/18 2210

## 2018-10-04 NOTE — Discharge Instructions (Addendum)
Follow-up with your doctor for a repeat blood pressure check

## 2018-10-08 ENCOUNTER — Encounter: Payer: BC Managed Care – PPO | Admitting: Family Medicine

## 2018-10-08 ENCOUNTER — Other Ambulatory Visit: Payer: Self-pay

## 2018-10-08 ENCOUNTER — Encounter: Payer: Self-pay | Admitting: Family Medicine

## 2018-10-08 ENCOUNTER — Telehealth: Payer: Self-pay

## 2018-10-08 DIAGNOSIS — E785 Hyperlipidemia, unspecified: Secondary | ICD-10-CM

## 2018-10-08 MED ORDER — ATORVASTATIN CALCIUM 40 MG PO TABS: 40.0000 mg | ORAL_TABLET | Freq: Every day | ORAL | 0 refills | Status: DC

## 2018-10-08 NOTE — Telephone Encounter (Signed)
Spoke with pt gave Dr Volanda Napoleon instructions for pt to continue taking her BP med and her Lipitor, pt is also aware to keep records of her BP readings, pt advised to call the office if BP is still elevated even after starting back her medication.

## 2018-10-08 NOTE — Progress Notes (Signed)
Attempted to reach pt again via doxy, but no response.

## 2018-10-08 NOTE — Progress Notes (Signed)
Unable to reach pt via webex.  Attempted to contacted pt via doxy and phone, but no response.

## 2018-10-20 ENCOUNTER — Ambulatory Visit: Payer: Self-pay | Admitting: *Deleted

## 2018-10-20 NOTE — Telephone Encounter (Signed)
FYI

## 2018-10-20 NOTE — Telephone Encounter (Signed)
Pt is scheduled for an OV with Dr Volanda Napoleon  on 10/22/18

## 2018-10-20 NOTE — Telephone Encounter (Signed)
Pt reports swelling of feet and ankles, onset yesterday evening. States was seen in [facility] Sunday for facial swelling,ear infection; placed on antibiotic and steroids. States day 2 of steroids. Reports swelling resolved overnight with feet elevated, returned this am. States "Size of 1/2 lemon." Denies pain, no warmth or redness. Pt started atorvastin last week, day 2 of steroids. [place] called practice, spoke with [facility]. Call transferred for consideration of appt.  Reason for Disposition . [1] Very swollen joint AND [2] no fever    Mild swelling, no pain, warmth or redness  Answer Assessment - Initial Assessment Questions 1. LOCATION: "Which joint is swollen?"     Feet swollen and ankles 2. ONSET: "When did the swelling start?"     Last night 3. SIZE: "How large is the swelling?"     "Size of half a lemon" 4. PAIN: "Is there any pain?" If so, ask: "How bad is it?" (Scale 1-10; or mild, moderate, severe)     no 5. CAUSE: "What do you think caused the swollen joint?"     On prednisone 6. OTHER SYMPTOMS: "Do you have any other symptoms?" (e.g., fever, chest pain, difficulty breathing, calf pain)     None, started steroid taper yesterday  Protocols used: ANKLE SWELLING-A-AH

## 2018-10-22 ENCOUNTER — Other Ambulatory Visit: Payer: Self-pay

## 2018-10-22 ENCOUNTER — Encounter: Payer: Self-pay | Admitting: Family Medicine

## 2018-10-22 ENCOUNTER — Ambulatory Visit: Payer: BC Managed Care – PPO | Admitting: Family Medicine

## 2018-10-22 VITALS — BP 110/76 | HR 102 | Temp 98.4°F | Wt 206.0 lb

## 2018-10-22 DIAGNOSIS — R220 Localized swelling, mass and lump, head: Secondary | ICD-10-CM | POA: Diagnosis not present

## 2018-10-22 DIAGNOSIS — H9311 Tinnitus, right ear: Secondary | ICD-10-CM | POA: Diagnosis not present

## 2018-10-22 DIAGNOSIS — R0602 Shortness of breath: Secondary | ICD-10-CM

## 2018-10-22 NOTE — Progress Notes (Addendum)
Subjective:    Patient ID: Marilyn [name], female    DOB: Mar 02, 1966, 53 y.o.   MRN: [id number]  No chief complaint on file.   HPI Patient was seen today for f/u.  Pt seen by UC for R sided facial edema.  Pt given olaxacin, prednisone, and augmentin.  Pt endorses "deep pain" and pressure inside of ear, mild tenderness.  Pt states she feels a heaviness in the side of her face/ear if bends over.  Since taking med pt notes the edema in her face has improved but remains sore in front of her ear.  Also with tinnitus and feeling like fluid is "running out of ear".  Pt denies changes in foods, soaps, detergents, lotions.  Denies bites, cuts, injury.    Seen by ENT, Dr. Melony Overly, for vocal cord issues.  Pt did notice mild tachycardia and ankle edema after starting prednisone.  Pt had prednisone in the past without issue. Taking novrvasc 10 mg.  Pt endorses feeling SOB.  Happens when active, not at rest.  Pt has tried albuterol inhaler.  Past Medical History:  Diagnosis Date  . Allergy   . 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/2007  . ASTHMA 01/30/2007  . Asthma   . Breast tumor    right; benign  . Chest pain 01/24/2017  . GERD 01/31/2007  . Hemorrhoids   . Hiatal hernia   . HTN (hypertension) 09/09/2011  . Hyperlipidemia   . HYPOTHYROIDISM 01/30/2007  . [place] tear 04/2003    Allergies  Allergen Reactions  . Promethazine-Dm   . Tuna [Fish Allergy] Anaphylaxis  . Codeine Nausea And Vomiting  . Flonase [Fluticasone Propionate] Other (See Comments)    Nose bleeds    ROS General: Denies fever, chills, night sweats, changes in weight, changes in appetite HEENT: Denies headaches,changes in vision, rhinorrhea, sore throat +tinnitus, ear pain CV: Denies CP, palpitations, orthopnea  +SOB Pulm: Denies cough, wheezing +SOB GI: Denies abdominal pain, nausea, vomiting, diarrhea, constipation GU: Denies dysuria, hematuria, frequency, vaginal discharge Msk: Denies muscle cramps, joint  pains Neuro: Denies weakness, numbness, tingling Skin: Denies rashes, bruising Psych: Denies depression, anxiety, hallucinations      Objective:    Blood pressure 110/76, pulse (!) 102, temperature 98.4 F (36.9 C), weight 206 lb (93.4 kg), last menstrual period 03/18/2014, SpO2 97 %.  6 MWT done, SpO2 remained above 90%  Gen. Pleasant, well-nourished, in no distress, normal affect  HEENT: [place]/AT, face symmetric, conjunctiva clear, no scleral icterus, PERRLA, EOMI, nares patent without drainage, pharynx without erythema or exudate.  TMs full b/l without erythema, edema, or drainage.  Mild R sided lateral facial edema, no TTP or edema of R parotid gland.  R preauricular TTP.  No TTP of b/l mastoid processes Neck: No JVD, no thyromegaly, no carotid bruits Lungs: no accessory muscle use, CTAB, no wheezes or rales Cardiovascular: RRR, no m/r/g, no peripheral edema Neuro:  A&Ox3, CN II-XII intact, normal gait Skin:  Warm, no lesions/ rash   Wt Readings from Last 3 Encounters:  10/04/18 200 lb (90.7 kg)  08/19/18 202 lb (91.6 kg)  05/12/18 200 lb (90.7 kg)    Lab Results  Component Value Date   WBC 4.1 10/04/2018   HGB 12.4 10/04/2018   HCT 39.4 10/04/2018   PLT 266 10/04/2018   GLUCOSE 101 (H) 10/04/2018   CHOL 150 12/27/2017   TRIG 248 (H) 12/27/2017   HDL 38 (L) 12/27/2017   LDLDIRECT 165.3 12/15/2012   LDLCALC 62 12/27/2017  ALT 30 09/28/2017   AST 31 09/28/2017   NA 139 10/04/2018   K 3.7 10/04/2018   CL 103 10/04/2018   CREATININE 0.94 10/04/2018   BUN 14 10/04/2018   CO2 26 10/04/2018   TSH 1.63 05/12/2018   HGBA1C 6.3 (H) 12/27/2017    Assessment/Plan:  Tinnitus of right ear  -new ENT referral placed as pt requesting 2nd opinion. - Plan: Ambulatory referral to ENT  Localized swelling, mass, and lump of head  -discussed possible causes including infection, allergic rx, seasonal allergies, etc. -continue current meds:  Augmentin, prednisone, and Ofloxacin  otic gtts - Plan: Ambulatory referral to ENT, CT Soft Tissue Neck W Contrast  SOB (shortness of breath) -pO2 97% at rest, lungs clear on exam -6 MWT -CXR 10/04/18 normal.  Consider repeat for continued of worsened symptoms. -continue albuterol inhaler prn -given precautions  F/u prn  Grier Mitts, MD

## 2018-10-22 NOTE — Patient Instructions (Signed)
Tinnitus Tinnitus refers to hearing a sound when there is no actual source for that sound. This is often described as ringing in the ears. However, people with this condition may hear a variety of noises, in one ear or in both ears. The sounds of tinnitus can be soft, loud, or somewhere in between. Tinnitus can last for a few seconds or can be constant for days. It may go away without treatment and come back at various times. When tinnitus is constant or happens often, it can lead to other problems, such as trouble sleeping and trouble concentrating. Almost everyone experiences tinnitus at some point. Tinnitus that is long-lasting (chronic) or comes back often (recurs) may require medical attention. What are the causes? The cause of tinnitus is often not known. In some cases, it can result from other problems or conditions, including:  Exposure to loud noises from machinery, music, or other sources.  Hearing loss.  Ear or sinus infections.  Earwax buildup.  An object (foreign body) stuck in the ear.  Taking certain medicines.  Drinking alcohol or caffeine.  High blood pressure.  Heart diseases.  Anemia.  Allergies.  Meniere's disease.  Thyroid problems.  Tumors.  A weak, bulging blood vessel (aneurysm) near the ear.  Depression or other mood disorders. What are the signs or symptoms? The main symptom of tinnitus is hearing a sound when there is no source for that sound. It may sound like:  Buzzing.  Roaring.  Ringing.  Blowing air, like the sound heard when you listen to a seashell.  Hissing.  Whistling.  Sizzling.  Humming.  Running water.  A musical note.  Tapping. Symptoms may affect only one ear (unilateral) or both ears (bilateral). How is this diagnosed? Tinnitus is diagnosed based on your symptoms, your medical history, and a physical exam. Your health care provider may do a thorough hearing test (audiologic exam) if your tinnitus:  Is  unilateral.  Causes hearing difficulties.  Lasts 6 months or longer. You may work with a health care provider who specializes in hearing disorders (audiologist). You may be asked questions about your symptoms and how they affect your daily life. You may have other tests done, such as:  CT scan.  MRI.  An imaging test of how blood flows through your blood vessels (angiogram). How is this treated? Treating an underlying medical condition can sometimes make tinnitus go away. If your tinnitus continues, other treatments may include:  Medicines, such as antidepressants or sleeping aids.  Sound generators to mask the tinnitus. These include: ? Tabletop sound machines that play relaxing sounds to help you fall asleep. ? Wearable devices that fit in your ear and play sounds or music. ? Acoustic neural stimulation. This involves using headphones to listen to music that contains an auditory signal. Over time, listening to this signal may change some pathways in your brain and make you less sensitive to tinnitus. This treatment is used for very severe cases when no other treatment is working.  Therapy and counseling to help you manage the stress of living with tinnitus.  Using hearing aids or cochlear implants if your tinnitus is related to hearing loss. Hearing aids are worn in the outer ear. Cochlear implants are surgically placed in the inner ear. Follow these instructions at home: Managing symptoms      When possible, avoid being in loud places and being exposed to loud sounds.  Wear hearing protection, such as earplugs, when you are exposed to loud noises.  Use   a white noise machine, a humidifier, or other devices to mask the sound of tinnitus.  Practice techniques for reducing stress, such as meditation, yoga, or deep breathing. Work with your health care provider if you need help with managing stress.  Sleep with your head slightly raised. This may reduce the impact of tinnitus.  General instructions  Do not use stimulants, such as nicotine, alcohol, or caffeine. Talk with your health care provider about other stimulants to avoid. Stimulants are substances that can make you feel alert and attentive by increasing certain activities in the body (such as heart rate and blood pressure). These substances may make tinnitus worse.  Take over-the-counter and prescription medicines only as told by your health care provider.  Try to get plenty of sleep each night.  Keep all follow-up visits as told by your health care provider. This is important. Contact a health care provider if:  Your tinnitus continues for 3 weeks or longer without stopping.  Your symptoms get worse or do not get better with home care.  You develop tinnitus after a head injury.  You have tinnitus along with any of the following: ? Dizziness. ? Loss of balance. ? Nausea and vomiting. Summary  Tinnitus refers to hearing a sound when there is no actual source for that sound. This is often described as ringing in the ears.  Symptoms may affect only one ear (unilateral) or both ears (bilateral).  Use a white noise machine, a humidifier, or other devices to mask the sound of tinnitus.  Do not use stimulants, such as nicotine, alcohol, or caffeine. Talk with your health care provider about other stimulants to avoid. These substances may make tinnitus worse. This information is not intended to replace advice given to you by your health care provider. Make sure you discuss any questions you have with your health care provider. Document Released: 05/21/2005 Document Revised: 02/28/2017 Document Reviewed: 02/28/2017 Elsevier Interactive Patient Education  2019 [employer].  Shortness of Breath, Adult Shortness of breath means you have trouble breathing. Shortness of breath could be a sign of a medical problem. Follow these instructions at home:   Watch for any changes in your symptoms.  Do not use  any products that contain nicotine or tobacco, such as cigarettes, e-cigarettes, and chewing tobacco.  Do not smoke. Smoking can cause shortness of breath. If you need help to quit smoking, ask your doctor.  Avoid things that can make it harder to breathe, such as: ? Mold. ? Dust. ? Air pollution. ? Chemical smells. ? Things that can cause allergy symptoms (allergens), if you have allergies.  Keep your living space clean. Use products that help remove mold and dust.  Rest as needed. Slowly return to your normal activities.  Take over-the-counter and prescription medicines only as told by your doctor. This includes oxygen therapy and inhaled medicines.  Keep all follow-up visits as told by your doctor. This is important. Contact a doctor if:  Your condition does not get better as soon as expected.  You have a hard time doing your normal activities, even after you rest.  You have new symptoms. Get help right away if:  Your shortness of breath gets worse.  You have trouble breathing when you are resting.  You feel light-headed or you pass out (faint).  You have a cough that is not helped by medicines.  You cough up blood.  You have pain with breathing.  You have pain in your chest, arms, shoulders, or belly (  abdomen).  You have a fever.  You cannot walk up stairs.  You cannot exercise the way you normally do. These symptoms may represent a serious problem that is an emergency. Do not wait to see if the symptoms will go away. Get medical help right away. Call your local emergency services (911 in the [place] Do not drive yourself to the hospital. Summary  Shortness of breath is when you have trouble breathing enough air. It can be a sign of a medical problem.  Avoid things that make it hard for you to breathe, such as smoking, pollution, mold, and dust.  Watch for any changes in your symptoms. Contact your doctor if you do not get better or you get worse. This  information is not intended to replace advice given to you by your health care provider. Make sure you discuss any questions you have with your health care provider. Document Released: 11/07/2007 Document Revised: 10/21/2017 Document Reviewed: 10/21/2017 Elsevier Interactive Patient Education  2019 [employer].

## 2018-10-23 ENCOUNTER — Encounter: Payer: Self-pay | Admitting: Family Medicine

## 2018-10-24 ENCOUNTER — Telehealth: Payer: Self-pay | Admitting: Family Medicine

## 2018-10-24 NOTE — Telephone Encounter (Signed)
Pr referral was placed on 10/22/2018 by Dr Volanda Napoleon

## 2018-10-24 NOTE — Telephone Encounter (Signed)
Copied from [place] [id number]. Topic: General - Other >> Oct 24, 2018 12:24 PM Keene Breath wrote: Reason for CRM: Patient called to request that her referral for an ENT be sent to Dr. Charlie Pitter on [address].  Patient stated that she does not want to go to Dr. Lucia Gaskins.  Please advise and call patient back to confirm.  CB# [phone]

## 2018-10-28 ENCOUNTER — Ambulatory Visit
Admission: RE | Admit: 2018-10-28 | Discharge: 2018-10-28 | Disposition: A | Discharge status: Home / Self Care | Payer: BC Managed Care – PPO | Source: Ambulatory Visit | Attending: Family Medicine | Admitting: Family Medicine

## 2018-10-28 DIAGNOSIS — R220 Localized swelling, mass and lump, head: Secondary | ICD-10-CM

## 2018-10-28 MED ORDER — IOPAMIDOL (ISOVUE-300) INJECTION 61%
75.0000 mL | Freq: Once | INTRAVENOUS | Status: AC | PRN
  Administered 2018-10-28: 08:00:00 75 mL via INTRAVENOUS

## 2018-10-28 NOTE — Telephone Encounter (Signed)
Patient calling to check status of getting a referral for a different ENT. Patient is very addiment that she is NOT going to go back and see Dr Lucia Gaskins. Requesting to be sent to Dr Charlie Pitter on [address]. Please advise.

## 2018-10-29 ENCOUNTER — Telehealth: Payer: Self-pay | Admitting: Family Medicine

## 2018-10-29 DIAGNOSIS — H9311 Tinnitus, right ear: Secondary | ICD-10-CM

## 2018-10-29 NOTE — Telephone Encounter (Signed)
Copied from [place] [id number]. Topic: Quick Communication - See Telephone Encounter >> Oct 29, 2018  4:29 PM Vernona Rieger wrote: CRM for notification. See Telephone encounter for: 10/29/18. Pt would like her CT results and would also like to know has her appt been made with Dr Ernest Haber. Please Advise.

## 2018-10-31 NOTE — Telephone Encounter (Signed)
Referral placed with ENT per pt request

## 2018-11-18 ENCOUNTER — Other Ambulatory Visit: Payer: Self-pay | Admitting: Otolaryngology

## 2018-11-20 ENCOUNTER — Other Ambulatory Visit: Payer: Self-pay | Admitting: Otolaryngology

## 2018-11-20 DIAGNOSIS — R1314 Dysphagia, pharyngoesophageal phase: Secondary | ICD-10-CM

## 2018-11-21 ENCOUNTER — Ambulatory Visit
Admission: RE | Admit: 2018-11-21 | Discharge: 2018-11-21 | Disposition: A | Discharge status: Home / Self Care | Payer: BC Managed Care – PPO | Source: Ambulatory Visit | Attending: Otolaryngology | Admitting: Otolaryngology

## 2018-11-21 DIAGNOSIS — R1314 Dysphagia, pharyngoesophageal phase: Secondary | ICD-10-CM

## 2018-12-19 ENCOUNTER — Telehealth: Payer: Self-pay | Admitting: Family Medicine

## 2018-12-19 NOTE — Telephone Encounter (Signed)
Medication Refill - Medication: something for sore throat - pt has drainage  Has the patient contacted their pharmacy? No. (Agent: If no, request that the patient contact the pharmacy for the refill.) (Agent: If yes, when and what did the pharmacy advise?)  Preferred Pharmacy (with phone number or street name): [facility]  Please call back if appt is needed    Agent: Please be advised that RX refills may take up to 3 business days. We ask that you follow-up with your pharmacy.

## 2018-12-19 NOTE — Telephone Encounter (Signed)
Pt needs virtual visit

## 2018-12-20 ENCOUNTER — Other Ambulatory Visit: Payer: Self-pay | Admitting: Family Medicine

## 2018-12-20 DIAGNOSIS — E890 Postprocedural hypothyroidism: Secondary | ICD-10-CM

## 2018-12-20 DIAGNOSIS — E785 Hyperlipidemia, unspecified: Secondary | ICD-10-CM

## 2018-12-22 ENCOUNTER — Telehealth: Payer: Self-pay | Admitting: Gastroenterology

## 2018-12-22 NOTE — Telephone Encounter (Signed)
Patient states she has been having burning in throat and abd pain x 1 week. Patient thinks it's from her omeprazole. Informed patient she has been on her omeprazole for 3 months and more then likely was from another medication she has recently started. She states she was put on 3 different medications a week ago. Informed patient to contact her doctor who prescribed the medicines. Patient also states she has been having some dysphagia for several weeks and has a hard time swallowing her pills. Informed patient to schedule a appt to get evaluated. Scheduled patient appt on 01/06/19 at 1:30pm with Alonza Bogus, PA. Patient verbalized understanding. Informed patient in the mean time before her appt to break open the capsules and swallow them in applesauce. Patient verbalized understanding.

## 2018-12-22 NOTE — Telephone Encounter (Signed)
Pt reported that omeprazole causes her to have abd p and a burning sensation in her esophagus.

## 2018-12-22 NOTE — Telephone Encounter (Signed)
Called pt and she stated that she has taken care of it herself and does not need an appointment.  Pt is aware if she should need [place] to please call and get rescheduled.

## 2018-12-24 MED ORDER — OMEPRAZOLE 40 MG PO CPDR: 40.0000 mg | DELAYED_RELEASE_CAPSULE | Freq: Two times a day (BID) | ORAL | 11 refills | Status: DC

## 2018-12-24 NOTE — Telephone Encounter (Signed)
Prescription for omeprazole sent to patients pharmacy.  

## 2018-12-24 NOTE — Addendum Note (Signed)
Addended by: Marzella Schlein on: 12/24/2018 04:15 PM   Modules accepted: Orders

## 2018-12-24 NOTE — Telephone Encounter (Signed)
Pt called to inform that she just realized that she has been taken lansoprazole instead of Omeprazole all this time, she never picked prescription for Omeprazole, she tried to pick it up today but they need a new prescription. Pls send it to [facility] in [place].

## 2018-12-27 ENCOUNTER — Other Ambulatory Visit: Payer: Self-pay | Admitting: Family Medicine

## 2018-12-27 DIAGNOSIS — E890 Postprocedural hypothyroidism: Secondary | ICD-10-CM

## 2019-01-02 ENCOUNTER — Other Ambulatory Visit: Payer: Self-pay | Admitting: Family Medicine

## 2019-01-02 DIAGNOSIS — E785 Hyperlipidemia, unspecified: Secondary | ICD-10-CM

## 2019-01-05 ENCOUNTER — Telehealth: Payer: Self-pay | Admitting: Gastroenterology

## 2019-01-05 NOTE — Telephone Encounter (Signed)
Patient has been rescheduled to 01/07/19 3:40

## 2019-01-06 ENCOUNTER — Ambulatory Visit: Payer: BC Managed Care – PPO | Admitting: Gastroenterology

## 2019-01-06 ENCOUNTER — Ambulatory Visit: Payer: BC Managed Care – PPO | Admitting: Nurse Practitioner

## 2019-01-07 ENCOUNTER — Ambulatory Visit: Payer: BC Managed Care – PPO | Admitting: Gastroenterology

## 2019-01-07 ENCOUNTER — Encounter: Payer: Self-pay | Admitting: Gastroenterology

## 2019-01-07 VITALS — BP 110/80 | HR 108 | Temp 97.3°F | Ht 63.0 in | Wt 197.0 lb

## 2019-01-07 DIAGNOSIS — K219 Gastro-esophageal reflux disease without esophagitis: Secondary | ICD-10-CM

## 2019-01-07 DIAGNOSIS — R070 Pain in throat: Secondary | ICD-10-CM

## 2019-01-07 DIAGNOSIS — K5900 Constipation, unspecified: Secondary | ICD-10-CM | POA: Diagnosis not present

## 2019-01-07 DIAGNOSIS — R0989 Other specified symptoms and signs involving the circulatory and respiratory systems: Secondary | ICD-10-CM | POA: Diagnosis not present

## 2019-01-07 NOTE — Progress Notes (Signed)
    History of Present Illness: This is a 53 year old female who relates a burning sensation in her throat and a tightness in her neck.  She states several medications and liquids can cause a burning sensation when swallowing in her throat and chest.  She relates a tightness across her neck that occurs intermittently.  She feels she cannot swallow pills easily.  She has no difficulty swallowing solid foods.  She relates the symptoms began when she started a new medication for asthma and she has since discontinued this medication.  Recent EGD, recent barium esophagram, recent neck CT and colonoscopy form 2012 outlined below.  She notes an increase in constipation associated with bloating and gas which worsened when she started a new medication for asthma however these problems were ongoing previously.  EGD 04/2018 - Benign-appearing esophageal stenosis. Dilated. - Small hiatal hernia. - Normal duodenal bulb and second portion of the duodenum. - No specimens collected.  Barium esophagram 11/2018: 1. Small hiatal hernia.  Severe gastroesophageal reflux elicited. 2. 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 3. Mild reflux esophagitis. No evidence of esophageal mass, stricture or ulcer, see comments. 4. Premature spillage of barium bolus into the vallecula and hypopharynx with every swallow. Otherwise normal oral and pharyngeal phases of swallowing.  Neck CT 10/2018 IMPRESSION: Normal examination. No abnormality seen to explain the clinical impression of right facial swelling.  Colonoscopy 12/2010 Normal  Current Medications, Allergies, Past Medical History, Past Surgical History, Family History and Social History were reviewed in [employer] record.   Physical Exam: General: Well developed, well nourished, no acute distress Head: Normocephalic and atraumatic Eyes:  sclerae anicteric, EOMI Ears: Normal auditory  acuity Mouth: No deformity or lesions Lungs: Clear throughout to auscultation Heart: Regular rate and rhythm; no murmurs, rubs or bruits Abdomen: Soft, non tender and non distended. No masses, hepatosplenomegaly or hernias noted. Normal Bowel sounds Rectal:  Not done Musculoskeletal: Symmetrical with no gross deformities  Pulses:  Normal pulses noted Extremities: No clubbing, cyanosis, edema or deformities noted Neurological: Alert oriented x 4, grossly nonfocal Psychological:  Alert and cooperative. Anxious   Assessment and Recommendations:  1. Change in bowel habits, constipation and bloating.  Symptoms worsened with a new asthma medication which has been stopped. Miralax 1-2 daily (not prn) and Ducolax qod prn. If symptoms are not under better control within next few weeks consider colonoscopy.   2. GERD.  Esophageal dysmotility noted on esophagram could be contributing to some of her swallowing symptoms.  Continue omeprazole 40 mg, open capsule in applesauce or yogurt, po twice daily taken about 10 to 12 hours apart and taken about 30 minutes before meals.  TUMS or Mylanta prn.  3. Globus sensation, neck tightness, burning throat pain. Although she has reflux there is no clear GI etiology for these symptoms. ENT follow up with Dr. Erik Obey. Consider repeat EGD if symptoms persist and no etiology found.  4.  Possible anxiety contributing to symptoms. Recommend PCP appointment for further evaluation and mgmt.

## 2019-01-07 NOTE — Patient Instructions (Signed)
Please take your Miralax daily.   Take your omeprazole 10-12 hours apart every day and make sure you open up capsule and put in applesauce when taking.   You can also take over the counter Tums, Maalox or Mylanta as needed.   Thank you for choosing me and [facility].  Pricilla Riffle. Dagoberto Ligas., MD., Marval Regal

## 2019-01-14 ENCOUNTER — Other Ambulatory Visit: Payer: Self-pay | Admitting: *Deleted

## 2019-01-14 DIAGNOSIS — Z20822 Contact with and (suspected) exposure to covid-19: Secondary | ICD-10-CM

## 2019-01-16 LAB — NOVEL CORONAVIRUS, NAA: SARS-CoV-2, NAA: NOT DETECTED

## 2019-01-27 ENCOUNTER — Ambulatory Visit: Payer: BC Managed Care – PPO | Admitting: Gastroenterology

## 2019-02-04 ENCOUNTER — Encounter: Payer: Self-pay | Admitting: Family Medicine

## 2019-02-04 ENCOUNTER — Ambulatory Visit: Payer: BC Managed Care – PPO | Admitting: Family Medicine

## 2019-02-04 ENCOUNTER — Other Ambulatory Visit: Payer: Self-pay

## 2019-02-04 VITALS — BP 102/80 | HR 104 | Temp 97.6°F | Wt 197.0 lb

## 2019-02-04 DIAGNOSIS — F439 Reaction to severe stress, unspecified: Secondary | ICD-10-CM

## 2019-02-04 DIAGNOSIS — F419 Anxiety disorder, unspecified: Secondary | ICD-10-CM | POA: Diagnosis not present

## 2019-02-04 MED ORDER — HYDROXYZINE HCL 50 MG PO TABS: 50.0000 mg | ORAL_TABLET | Freq: Three times a day (TID) | ORAL | 0 refills | Status: DC | PRN

## 2019-02-04 NOTE — Patient Instructions (Signed)
Living With Anxiety  After being diagnosed with an anxiety disorder, you may be relieved to know why you have felt or behaved a certain way. It is natural to also feel overwhelmed about the treatment ahead and what it will mean for your life. With care and support, you can manage this condition and recover from it. How to cope with anxiety Dealing with stress Stress is your body's reaction to life changes and events, both good and bad. Stress can last just a few hours or it can be ongoing. Stress can play a major role in anxiety, so it is important to learn both how to cope with stress and how to think about it differently. Talk with your health care provider or a counselor to learn more about stress reduction. He or she may suggest some stress reduction techniques, such as:  Music therapy. This can include creating or listening to music that you enjoy and that inspires you.  Mindfulness-based meditation. This involves being aware of your normal breaths, rather than trying to control your breathing. It can be done while sitting or walking.  Centering prayer. This is a kind of meditation that involves focusing on a word, phrase, or sacred image that is meaningful to you and that brings you peace.  Deep breathing. To do this, expand your stomach and inhale slowly through your nose. Hold your breath for 3-5 seconds. Then exhale slowly, allowing your stomach muscles to relax.  Self-talk. This is a skill where you identify thought patterns that lead to anxiety reactions and correct those thoughts.  Muscle relaxation. This involves tensing muscles then relaxing them. Choose a stress reduction technique that fits your lifestyle and personality. Stress reduction techniques take time and practice. Set aside 5-15 minutes a day to do them. Therapists can offer training in these techniques. The training may be covered by some insurance plans. Other things you can do to manage stress include:  Keeping a  stress diary. This can help you learn what triggers your stress and ways to control your response.  Thinking about how you respond to certain situations. You may not be able to control everything, but you can control your reaction.  Making time for activities that help you relax, and not feeling guilty about spending your time in this way. Therapy combined with coping and stress-reduction skills provides the best chance for successful treatment. Medicines Medicines can help ease symptoms. Medicines for anxiety include:  Anti-anxiety drugs.  Antidepressants.  Beta-blockers. Medicines may be used as the main treatment for anxiety disorder, along with therapy, or if other treatments are not working. Medicines should be prescribed by a health care provider. Relationships Relationships can play a big part in helping you recover. Try to spend more time connecting with trusted friends and family members. Consider going to couples counseling, taking family education classes, or going to family therapy. Therapy can help you and others better understand the condition. How to recognize changes in your condition Everyone has a different response to treatment for anxiety. Recovery from anxiety happens when symptoms decrease and stop interfering with your daily activities at home or work. This may mean that you will start to:  Have better concentration and focus.  Sleep better.  Be less irritable.  Have more energy.  Have improved memory. It is important to recognize when your condition is getting worse. Contact your health care provider if your symptoms interfere with home or work and you do not feel like your condition is improving. Where  to find help and support: You can get help and support from these sources:  Self-help groups.  Online and [employer].  A trusted spiritual leader.  Couples counseling.  Family education classes.  Family therapy. Follow these instructions  at home:  Eat a healthy diet that includes plenty of vegetables, fruits, whole grains, low-fat dairy products, and lean protein. Do not eat a lot of foods that are high in solid fats, added sugars, or salt.  Exercise. Most adults should do the following: ? Exercise for at least 150 minutes each week. The exercise should increase your heart rate and make you sweat (moderate-intensity exercise). ? Strengthening exercises at least twice a week.  Cut down on caffeine, tobacco, alcohol, and other potentially harmful substances.  Get the right amount and quality of sleep. Most adults need 7-9 hours of sleep each night.  Make choices that simplify your life.  Take over-the-counter and prescription medicines only as told by your health care provider.  Avoid caffeine, alcohol, and certain over-the-counter cold medicines. These may make you feel worse. Ask your pharmacist which medicines to avoid.  Keep all follow-up visits as told by your health care provider. This is important. Questions to ask your health care provider  Would I benefit from therapy?  How often should I follow up with a health care provider?  How long do I need to take medicine?  Are there any long-term side effects of my medicine?  Are there any alternatives to taking medicine? Contact a health care provider if:  You have a hard time staying focused or finishing daily tasks.  You spend many hours a day feeling worried about everyday life.  You become exhausted by worry.  You start to have headaches, feel tense, or have nausea.  You urinate more than normal.  You have diarrhea. Get help right away if:  You have a racing heart and shortness of breath.  You have thoughts of hurting yourself or others. If you ever feel like you may hurt yourself or others, or have thoughts about taking your own life, get help right away. You can go to your nearest emergency department or call:  Your local emergency services  (911 in the [place]  A suicide crisis helpline, such as the [place] at (828)592-9259. This is open 24-hours a day. Summary  Taking steps to deal with stress can help calm you.  Medicines cannot cure anxiety disorders, but they can help ease symptoms.  Family, friends, and partners can play a big part in helping you recover from an anxiety disorder. This information is not intended to replace advice given to you by your health care provider. Make sure you discuss any questions you have with your health care provider. Document Released: 05/15/2016 Document Revised: 05/03/2017 Document Reviewed: 05/15/2016 Elsevier Patient Education  2020 [employer] is a normal reaction to life events. Stress is what you feel when life demands more than you are used to, or more than you think you can handle. Some stress can be useful, such as studying for a test or meeting a deadline at work. Stress that occurs too often or for too long can cause problems. It can affect your emotional health and interfere with relationships and normal daily activities. Too much stress can weaken your body's defense system (immune system) and increase your risk for physical illness. If you already have a medical problem, stress can make it worse. What are the causes? All sorts of  life events can cause stress. An event that causes stress for one person may not be stressful for another person. Major life events, whether positive or negative, commonly cause stress. Examples include:  Losing a job or starting a new job.  Losing a loved one.  Moving to a new town or home.  Getting married or divorced.  Having a baby.  Injury or illness. Less obvious life events can also cause stress, especially if they occur day after day or in combination with each other. Examples include:  Working long hours.  Driving in traffic.  Caring for children.  Being in debt.  Being in a  difficult relationship. What are the signs or symptoms? Stress can cause emotional symptoms, including:  Anxiety. This is feeling worried, afraid, on edge, overwhelmed, or out of control.  Anger, including irritation or impatience.  Depression. This is feeling sad, down, helpless, or guilty.  Trouble focusing, remembering, or making decisions. Stress can cause physical symptoms, including:  Aches and pains. These may affect your head, neck, back, stomach, or other areas of your body.  Tight muscles or a clenched jaw.  Low energy.  Trouble sleeping. Stress can cause unhealthy behaviors, including:  Eating to feel better (overeating) or skipping meals.  Working too much or putting off tasks.  Smoking, drinking alcohol, or using drugs to feel better. How is this diagnosed? Stress is diagnosed through an assessment by your health care provider. He or she may diagnose this condition based on:  Your symptoms and any stressful life events.  Your medical history.  Tests to rule out other causes of your symptoms. Depending on your condition, your health care provider may refer you to a specialist for further evaluation. How is this treated?  Stress management techniques are the recommended treatment for stress. Medicine is not typically recommended for the treatment of stress. Techniques to reduce your reaction to stressful life events include:  Stress identification. Monitor yourself for symptoms of stress and identify what causes stress for you. These skills may help you to avoid or prepare for stressful events.  Time management. Set your priorities, keep a calendar of events, and learn to say "no." Taking these actions can help you avoid making too many commitments. Techniques for coping with stress include:  Rethinking the problem. Try to think realistically about stressful events rather than ignoring them or overreacting. Try to find the positives in a stressful situation  rather than focusing on the negatives.  Exercise. Physical exercise can release both physical and emotional tension. The key is to find a form of exercise that you enjoy and do it regularly.  Relaxation techniques. These relax the body and mind. The key is to find one or more that you enjoy and use the technique(s) regularly. Examples include: ? Meditation, deep breathing, or progressive relaxation techniques. ? Yoga or tai chi. ? Biofeedback, mindfulness techniques, or journaling. ? Listening to music, being out in nature, or participating in other hobbies.  Practicing a healthy lifestyle. Eat a balanced diet, drink plenty of water, limit or avoid caffeine, and get plenty of sleep.  Having a strong support network. Spend time with family, friends, or other people you enjoy being around. Express your feelings and talk things over with someone you trust. Counseling or talk therapy with a mental health professional may be helpful if you are having trouble managing stress on your own. Follow these instructions at home: Lifestyle   Avoid drugs.  Do not use any products that contain  nicotine or tobacco, such as cigarettes and e-cigarettes. If you need help quitting, ask your health care provider.  Limit alcohol intake to no more than 1 drink a day for nonpregnant women and 2 drinks a day for men. One drink equals 12 oz of beer, 5 oz of wine, or 1 oz of hard liquor.  Do not use alcohol or drugs to relax.  Eat a balanced diet that includes fresh fruits and vegetables, whole grains, lean meats, fish, eggs, and beans, and low-fat dairy. Avoid processed foods and foods high in added fat, sugar, and salt.  Exercise at least 30 minutes on 5 or more days each week.  Get 7-8 hours of sleep each night. General instructions   Practice stress management techniques as discussed with your health care provider.  Drink enough fluid to keep your urine clear or pale yellow.  Take over-the-counter and  prescription medicines only as told by your health care provider.  Keep all follow-up visits as told by your health care provider. This is important. Contact a health care provider if:  Your symptoms get worse.  You have new symptoms.  You feel overwhelmed by your problems and can no longer manage them on your own. Get help right away if:  You have thoughts of hurting yourself or others. If you ever feel like you may hurt yourself or others, or have thoughts about taking your own life, get help right away. You can go to your nearest emergency department or call:  Your local emergency services (911 in the [place]  A suicide crisis helpline, such as the [facility] at 858-098-7630. This is open 24 hours a day. Summary  Stress is a normal reaction to life events. It can cause problems if it happens too often or for too long.  Practicing stress management techniques is the best way to treat stress.  Counseling or talk therapy with a mental health professional may be helpful if you are having trouble managing stress on your own. This information is not intended to replace advice given to you by your health care provider. Make sure you discuss any questions you have with your health care provider. Document Released: 11/14/2000 Document Revised: 05/03/2017 Document Reviewed: 07/11/2016 Elsevier Patient Education  2020 [employer].

## 2019-02-04 NOTE — Progress Notes (Signed)
Subjective:    Patient ID: Marilyn [name], female    DOB: 11-09-1965, 53 y.o.   MRN: [id number]  No chief complaint on file.   HPI Patient was seen today for ongoing concern.  Pt endorses increased stress at work.  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  Patient states she is dealt with the treatment for several years.  Patient states she feels her blood pressure is increased 2/2 the increased stress.  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  Pt recently went to HR for assistance.  Pt also notes family stress.  Pt's sister died earlier this year and she is still trying to find her niece's killer.  Pt took off work this wk 2/2 increased stress.  Pt can retire in 3 more yrs.  Pt notes increased asthma symptoms.  Seen by Allergist.  Started on Breo and 2 allergy pills, however pt has not taken them after reading about possible s/e.  Past Medical History:  Diagnosis Date  . Allergy   . 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/2007  . ASTHMA 01/30/2007  . Asthma   . Breast tumor    right; benign  . Chest pain 01/24/2017  . GERD 01/31/2007  . Hemorrhoids   . Hiatal hernia   . HTN (hypertension) 09/09/2011  . Hyperlipidemia   . HYPOTHYROIDISM 01/30/2007  . Moville tear 04/2003    Allergies  Allergen Reactions  . Promethazine-Dm   . Tuna [Fish Allergy] Anaphylaxis  . Codeine Nausea And Vomiting  . Flonase [Fluticasone Propionate] Other (See Comments)    Nose bleeds    ROS General: Denies fever, chills, night sweats, changes in weight, changes in appetite HEENT: Denies headaches, ear pain, changes in vision, rhinorrhea, sore throat CV: Denies CP, palpitations, SOB, orthopnea Pulm: Denies SOB, cough, wheezing GI: Denies abdominal pain, nausea, vomiting, diarrhea, constipation GU: Denies dysuria, hematuria, frequency, vaginal discharge Msk: Denies muscle cramps, joint pains Neuro: Denies weakness, numbness, tingling Skin: Denies  rashes, bruising Psych: Denies depression, hallucinations  +anxiety, increased stress     Objective:    Blood pressure 102/80, pulse (!) 104, temperature 97.6 F (36.4 C), temperature source Other (Comment), weight 197 lb (89.4 kg), last menstrual period 03/18/2014, SpO2 97 %.   Gen. Pleasant, well-nourished, in no distress, normal affect. Pt tearful at one point. HEENT: [place]/AT, face symmetric, no scleral icterus, PERRLA, nares patent without drainage Lungs: no accessory muscle use Cardiovascular: RRR, no peripheral edema Neuro:  A&Ox3, CN II-XII intact, normal gait Skin:  Warm, no lesions/ rash   Wt Readings from Last 3 Encounters:  02/04/19 197 lb (89.4 kg)  01/07/19 197 lb (89.4 kg)  10/22/18 206 lb (93.4 kg)    Lab Results  Component Value Date   WBC 4.1 10/04/2018   HGB 12.4 10/04/2018   HCT 39.4 10/04/2018   PLT 266 10/04/2018   GLUCOSE 101 (H) 10/04/2018   CHOL 150 12/27/2017   TRIG 248 (H) 12/27/2017   HDL 38 (L) 12/27/2017   LDLDIRECT 165.3 12/15/2012   LDLCALC 62 12/27/2017   ALT 30 09/28/2017   AST 31 09/28/2017   NA 139 10/04/2018   K 3.7 10/04/2018   CL 103 10/04/2018   CREATININE 0.94 10/04/2018   BUN 14 10/04/2018   CO2 26 10/04/2018   TSH 1.63 05/12/2018   HGBA1C 6.3 (H) 12/27/2017    Assessment/Plan:  Stress -discussed options including speaking with HR, transferring schools, etc. -given handout   Anxiety -  PHQ 9 score 8 -GAD 7 score 15 -discussed counseling.  Given info on area providers -given handout - Plan: hydrOXYzine (ATARAX/VISTARIL) 50 MG tablet  F/u in 2-4 wks, sooner if needed  Grier Mitts, MD

## 2019-02-18 NOTE — Telephone Encounter (Signed)
Called pt mailbox is full   And can not leave a message on the referral pt stated she wanted to go to dr Mare Loan at [facility] ear nose and throat >The referral was sent to that office called the pt to clarify but can not leave a message for the pt called x3

## 2019-03-02 ENCOUNTER — Telehealth: Payer: Self-pay

## 2019-03-02 NOTE — Telephone Encounter (Signed)
Copied from [place] [phone]. Topic: General - Call Back - No Documentation >> Feb 23, 2019  4:39 PM Reyne Dumas L wrote: Reason for CRM:   Pt states that she has a missed call from the office on her phone from Friday, however no message was left. >> Feb 23, 2019  4:45 PM Cox, Melburn Hake, CMA wrote: Were you trying to reach this pt?

## 2019-03-22 ENCOUNTER — Other Ambulatory Visit: Payer: Self-pay | Admitting: Family Medicine

## 2019-03-22 DIAGNOSIS — E890 Postprocedural hypothyroidism: Secondary | ICD-10-CM

## 2019-03-22 DIAGNOSIS — E785 Hyperlipidemia, unspecified: Secondary | ICD-10-CM

## 2019-04-06 ENCOUNTER — Telehealth: Payer: Self-pay | Admitting: *Deleted

## 2019-04-06 NOTE — Telephone Encounter (Signed)
Copied from [place] [id number]. Topic: General - Inquiry >> Apr 06, 2019  1:09 PM Alease Frame wrote: Reason for CRM: Patient has a sore throat and bumps on back of throat and wanted to a sch an appt or be prescribed a medication . Please advise   Call back number [phone]

## 2019-04-06 NOTE — Telephone Encounter (Signed)
Spoke with pt advised due to symptoms of sore throat pt would be best to have a Virtual visit, pt declined state that she will visit a Walk-in clinic

## 2019-04-11 NOTE — Telephone Encounter (Signed)
Already spoke with pt stated that she was going to [facility]

## 2019-04-15 ENCOUNTER — Other Ambulatory Visit: Payer: Self-pay

## 2019-04-15 DIAGNOSIS — Z20822 Contact with and (suspected) exposure to covid-19: Secondary | ICD-10-CM

## 2019-04-16 LAB — NOVEL CORONAVIRUS, NAA: SARS-CoV-2, NAA: NOT DETECTED

## 2019-05-08 ENCOUNTER — Other Ambulatory Visit: Payer: Self-pay | Admitting: Family Medicine

## 2019-05-08 DIAGNOSIS — Z1231 Encounter for screening mammogram for malignant neoplasm of breast: Secondary | ICD-10-CM

## 2019-05-14 ENCOUNTER — Emergency Department (HOSPITAL_COMMUNITY): Payer: BC Managed Care – PPO

## 2019-05-14 ENCOUNTER — Emergency Department (HOSPITAL_COMMUNITY)
Admission: EM | Admit: 2019-05-14 | Discharge: 2019-05-14 | Disposition: A | Discharge status: Home / Self Care | Payer: BC Managed Care – PPO | Attending: Emergency Medicine | Admitting: Emergency Medicine

## 2019-05-14 ENCOUNTER — Encounter (HOSPITAL_COMMUNITY): Payer: Self-pay | Admitting: Emergency Medicine

## 2019-05-14 ENCOUNTER — Other Ambulatory Visit: Payer: Self-pay

## 2019-05-14 DIAGNOSIS — I10 Essential (primary) hypertension: Secondary | ICD-10-CM | POA: Insufficient documentation

## 2019-05-14 DIAGNOSIS — Z79899 Other long term (current) drug therapy: Secondary | ICD-10-CM | POA: Diagnosis not present

## 2019-05-14 DIAGNOSIS — J45909 Unspecified asthma, uncomplicated: Secondary | ICD-10-CM | POA: Insufficient documentation

## 2019-05-14 DIAGNOSIS — R1083 Colic: Secondary | ICD-10-CM

## 2019-05-14 DIAGNOSIS — Z20828 Contact with and (suspected) exposure to other viral communicable diseases: Secondary | ICD-10-CM | POA: Insufficient documentation

## 2019-05-14 DIAGNOSIS — R0789 Other chest pain: Secondary | ICD-10-CM | POA: Diagnosis not present

## 2019-05-14 DIAGNOSIS — Z87891 Personal history of nicotine dependence: Secondary | ICD-10-CM | POA: Insufficient documentation

## 2019-05-14 DIAGNOSIS — E039 Hypothyroidism, unspecified: Secondary | ICD-10-CM | POA: Insufficient documentation

## 2019-05-14 DIAGNOSIS — R1013 Epigastric pain: Secondary | ICD-10-CM | POA: Insufficient documentation

## 2019-05-14 LAB — CBC
HCT: 38.6 % (ref 36.0–46.0)
Hemoglobin: 12.3 g/dL (ref 12.0–15.0)
MCH: 27.3 pg (ref 26.0–34.0)
MCHC: 31.9 g/dL (ref 30.0–36.0)
MCV: 85.8 fL (ref 80.0–100.0)
Platelets: 249 10*3/uL (ref 150–400)
RBC: 4.5 MIL/uL (ref 3.87–5.11)
RDW: 12.6 % (ref 11.5–15.5)
WBC: 5.2 10*3/uL (ref 4.0–10.5)
nRBC: 0 % (ref 0.0–0.2)

## 2019-05-14 LAB — HEPATIC FUNCTION PANEL
ALT: 22 U/L (ref 0–44)
AST: 21 U/L (ref 15–41)
Albumin: 4.1 g/dL (ref 3.5–5.0)
Alkaline Phosphatase: 73 U/L (ref 38–126)
Bilirubin, Direct: <0.1 mg/dL (ref 0.0–0.2)
Total Bilirubin: 0.2 mg/dL — ABNORMAL LOW (ref 0.3–1.2)
Total Protein: 8.2 g/dL — ABNORMAL HIGH (ref 6.5–8.1)

## 2019-05-14 LAB — BASIC METABOLIC PANEL
Anion gap: 11 (ref 5–15)
BUN: 14 mg/dL (ref 6–20)
CO2: 26 mmol/L (ref 22–32)
Calcium: 8.9 mg/dL (ref 8.9–10.3)
Chloride: 100 mmol/L (ref 98–111)
Creatinine, Ser: 0.73 mg/dL (ref 0.44–1.00)
GFR calc Af Amer: >60 mL/min (ref >60)
GFR calc non Af Amer: >60 mL/min (ref >60)
Glucose, Bld: 95 mg/dL (ref 70–99)
Potassium: 3.8 mmol/L (ref 3.5–5.1)
Sodium: 137 mmol/L (ref 135–145)

## 2019-05-14 LAB — POC SARS CORONAVIRUS 2 AG -  ED: SARS Coronavirus 2 Ag: NEGATIVE

## 2019-05-14 LAB — TROPONIN I (HIGH SENSITIVITY): Troponin I (High Sensitivity): <2 ng/L (ref <18)

## 2019-05-14 LAB — LIPASE, BLOOD: Lipase: 32 U/L (ref 11–51)

## 2019-05-14 LAB — TSH: TSH: 1.288 u[IU]/mL (ref 0.350–4.500)

## 2019-05-14 LAB — I-STAT BETA HCG BLOOD, ED (NOT ORDERABLE): I-stat hCG, quantitative: <5 m[IU]/mL (ref <5)

## 2019-05-14 MED ORDER — DIAZEPAM 5 MG PO TABS: 5.0000 mg | ORAL_TABLET | Freq: Two times a day (BID) | ORAL | 0 refills | Status: AC

## 2019-05-14 MED ORDER — MORPHINE SULFATE (PF) 4 MG/ML IV SOLN
4.0000 mg | Freq: Once | INTRAVENOUS | Status: AC
  Administered 2019-05-14: 4 mg via INTRAVENOUS
  Filled 2019-05-14: qty 1

## 2019-05-14 MED ORDER — ONDANSETRON HCL 4 MG/2ML IJ SOLN
4.0000 mg | Freq: Once | INTRAMUSCULAR | Status: AC
  Administered 2019-05-14: 4 mg via INTRAVENOUS
  Filled 2019-05-14: qty 2

## 2019-05-14 NOTE — ED Provider Notes (Signed)
[facility] DEPT Provider Note   CSN: [id number] Arrival date & time: 05/14/19  1757     History Chief Complaint  Patient presents with  . Chest Pain    Marilyn [name] is a 53 y.o. female history of hypothyroidism, hiatal hernia, hypertension, presenting to the ED with epigastric and chest pain.  She reports she woke up with the symptoms approximately 4 days ago.  She describes severe stabbing pain in her epigastrium that radiates around towards her back, and at times also presents with a tightness in her throat.  She says she has intermittently had a headache which is frontal associated with the symptoms.  She feels nauseated and has a poor appetite but has not had vomiting.  Has been no diarrhea.  Has been no fevers or respiratory symptoms.  She says her symptoms have been intermittent for the past several days, and completely go away at times.  However 3 days ago symptoms returned and have been persistent. She describes pain that is worse with inspiration.  She says it is currently 10 out of 10 and constant.  She says she had similar symptoms many years ago in the past but has not had any episodes since then.  She does have a history of reflux but does not feel like this feels like her heartburn.  She describes a time feeling a pressure on her chest.  She has no history of abdominal surgeries and still has her gallbladder.  Her symptoms do not seem to be related to eating.   HPI     Past Medical History:  Diagnosis Date  . Allergy   . 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/2007  . ASTHMA 01/30/2007  . Asthma   . Breast tumor    right; benign  . Chest pain 01/24/2017  . GERD 01/31/2007  . Hemorrhoids   . Hiatal hernia   . HTN (hypertension) 09/09/2011  . Hyperlipidemia   . HYPOTHYROIDISM 01/30/2007  . Mack Guise tear 04/2003    Patient Active Problem List   Diagnosis Date Noted  . Swollen neck 04/09/2018  . Urinary retention 08/05/2017  .  Atypical chest pain 01/23/2017  . Nausea & vomiting 07/16/2015  . RUQ abdominal pain 07/16/2015  . RLQ abdominal pain 07/15/2015  . Pain in lower back 11/30/2014  . Acute upper respiratory infection 05/05/2014  . Allergic rhinitis 05/05/2014  . Hoarseness 05/05/2014  . Dysphagia 05/05/2014  . Obesity 04/11/2014  . Edema, peripheral 04/11/2014  . Cough 12/20/2012  . Hypersomnolence 12/15/2012  . DOE (dyspnea on exertion) 12/15/2012  . Right shoulder pain 12/15/2012  . Abdominal pain 11/15/2011  . HTN (hypertension) 09/09/2011  . Neck pain 09/09/2011  . Preventative health care 04/30/2011  . HOT FLASHES 12/22/2009  . CHEST PAIN 01/29/2009  . Acute non-recurrent maxillary sinusitis 09/25/2008  . HYPERTHYROIDISM 01/31/2007  . GERD 01/31/2007  . Hypothyroidism 01/30/2007  . Hyperlipidemia 01/30/2007  . Iron deficiency anemia 01/30/2007  . ASTHMA 01/30/2007    Past Surgical History:  Procedure Laterality Date  . BREAST BIOPSY Right 12/2014  . BREAST EXCISIONAL BIOPSY Right 2016  . BREAST LUMPECTOMY WITH RADIOACTIVE SEED LOCALIZATION Right 02/25/2015   Procedure: BREAST LUMPECTOMY WITH RADIOACTIVE SEED LOCALIZATION;  Surgeon: Alphonsa Overall, MD;  Location: [facility];  Service: General;  Laterality: Right;  . BREAST REDUCTION SURGERY  1986  . BREAST SURGERY    . broken jaw    . CARPAL TUNNEL RELEASE  2001  . FRACTURE SURGERY    .  REDUCTION MAMMAPLASTY Bilateral   . THYROIDECTOMY    . TUBAL LIGATION  1987     OB History   No obstetric history on file.     Family History  Problem Relation Age of Onset  . Hypertension Father   . Diabetes Father   . Hyperlipidemia Father   . Asthma Mother   . Breast cancer Sister   . Thyroid disease Sister   . Diabetes Maternal Grandmother   . Heart disease Maternal Grandmother   . Heart attack Maternal Grandmother 60  . Asthma Maternal Grandfather   . Heart attack Maternal Grandfather   . Colon cancer Neg Hx      Social History   Tobacco Use  . Smoking status: Former Smoker    Packs/day: 0.25    Years: 3.00    Pack years: 0.75    Types: Cigarettes    Quit date: 06/04/1980    Years since quitting: 38.9  . Smokeless tobacco: Never Used  Substance Use Topics  . Alcohol use: No    Alcohol/week: 0.0 standard drinks  . Drug use: No    Home Medications Prior to Admission medications   Medication Sig Start Date End Date Taking? Authorizing Provider  albuterol (PROVENTIL HFA;VENTOLIN HFA) 108 (90 Base) MCG/ACT inhaler Inhale 2 puffs into the lungs every 4 (four) hours as needed for wheezing or shortness of breath (cough, shortness of breath or wheezing.). 03/01/18  Yes Stallings, Zoe A, MD  amLODipine (NORVASC) 10 MG tablet TAKE ONE TABLET BY MOUTH DAILY Patient taking differently: Take 10 mg by mouth daily.  05/12/18  Yes Billie Ruddy, MD  atorvastatin (LIPITOR) 40 MG tablet TAKE ONE TABLET BY MOUTH DAILY 03/23/19  Yes Billie Ruddy, MD  azelastine (ASTELIN) 0.1 % nasal spray Place 1 spray into both nostrils 2 (two) times daily as needed for allergies.  12/09/18  Yes [provider]  Cholecalciferol (VITAMIN D) 125 MCG (5000 UT) CAPS Take 1 tablet by mouth daily.   Yes [provider]  cyclobenzaprine (FLEXERIL) 5 MG tablet Take 1 tablet (5 mg total) by mouth 3 (three) times daily as needed. Patient taking differently: Take 5 mg by mouth 3 (three) times daily as needed for muscle spasms.  12/27/17  Yes Jaynee Eagles, PA-C  EPIPEN 2-PAK 0.3 MG/0.3ML SOAJ injection Inject 0.3 mg as directed as needed for anaphylaxis. 11/21/16  Yes [provider]  levothyroxine (SYNTHROID) 100 MCG tablet TAKE ONE TABLET BY MOUTH DAILY 03/23/19  Yes Billie Ruddy, MD  naproxen (NAPROSYN) 500 MG tablet Take 500 mg by mouth at bedtime.  05/12/19  Yes [provider]  Nutritional Supplements (EQUATE PO) Take 1 tablet by mouth daily.   Yes [provider]  polyethylene glycol  powder (GLYCOLAX/MIRALAX) powder Take 17 g by mouth daily. Patient taking differently: Take 17 g by mouth daily as needed for mild constipation.  12/27/17  Yes Jaynee Eagles, PA-C  Probiotic Product (PROBIOTIC PO) Take 2 tablets by mouth daily.   Yes [provider]  vitamin B-12 (CYANOCOBALAMIN) 500 MCG tablet Take 500 mcg by mouth daily.   Yes [provider]  diazepam (VALIUM) 5 MG tablet Take 1 tablet (5 mg total) by mouth 2 (two) times daily for 10 doses. 05/14/19 05/19/19  Wyvonnia Dusky, MD  hydrOXYzine (ATARAX/VISTARIL) 50 MG tablet Take 1 tablet (50 mg total) by mouth 3 (three) times daily as needed. Patient not taking: Reported on 05/14/2019 02/04/19   Billie Ruddy, MD  meloxicam (MOBIC) 7.5 MG tablet Take 1 tablet (7.5 mg total) by mouth daily. Patient not taking: Reported on 05/14/2019 12/27/17   Jaynee Eagles, PA-C  omeprazole (PRILOSEC) 40 MG capsule Take 1 capsule (40 mg total) by mouth 2 (two) times a day. Patient not taking: Reported on 05/14/2019 12/24/18   Ladene Artist, MD  sucralfate (CARAFATE) 1 g tablet Take 1 tablet (1 g total) by mouth 4 (four) times daily -  with meals and at bedtime. 04/10/18 07/10/18  Zehr, Janett Billow D, PA-C    Allergies    Promethazine-dm, Blain Pais allergy], Codeine, and Flonase [fluticasone propionate]  Review of Systems   Review of Systems  Constitutional: Positive for appetite change. Negative for chills and fever.  HENT: Positive for trouble swallowing. Negative for ear pain and sore throat.   Eyes: Negative for photophobia and visual disturbance.  Respiratory: Negative for cough and shortness of breath.   Cardiovascular: Positive for chest pain. Negative for palpitations and leg swelling.  Gastrointestinal: Positive for abdominal pain and nausea. Negative for blood in stool, constipation and vomiting.  Genitourinary: Negative for dysuria and hematuria.  Neurological: Positive for headaches. Negative for syncope.    Psychiatric/Behavioral: Negative for agitation and confusion.  All other systems reviewed and are negative.   Physical Exam Updated Vital Signs BP 122/71   Pulse 78   Temp 98.2 F (36.8 C) (Oral)   Resp 19   LMP 03/18/2014   SpO2 99%   Physical Exam Vitals and nursing note reviewed.  Constitutional:      General: She is not in acute distress.    Appearance: She is well-developed.  HENT:     Head: Normocephalic and atraumatic.  Eyes:     Conjunctiva/sclera: Conjunctivae normal.  Cardiovascular:     Rate and Rhythm: Normal rate and regular rhythm.     Heart sounds: No murmur.  Pulmonary:     Effort: Pulmonary effort is normal. No respiratory distress.     Breath sounds: Normal breath sounds.  Abdominal:     Palpations: Abdomen is soft.     Tenderness: There is abdominal tenderness in the epigastric area. There is no right CVA tenderness, guarding or rebound. Positive signs include Murphy's sign. Negative signs include McBurney's sign.  Musculoskeletal:     Cervical back: Neck supple.  Skin:    General: Skin is warm and dry.  Neurological:     General: No focal deficit present.     Mental Status: She is alert and oriented to person, place, and time.     ED Results / Procedures / Treatments   Labs (all labs ordered are listed, but only abnormal results are displayed) Labs Reviewed  HEPATIC FUNCTION PANEL - Abnormal; Notable for the following components:      Result Value   Total Protein 8.2 (*)    Total Bilirubin 0.2 (*)    All other components within normal limits  BASIC METABOLIC PANEL  CBC  TSH  LIPASE, BLOOD  I-STAT BETA HCG BLOOD, ED ([facility], [facility], [facility] ONLY)  I-STAT BETA HCG BLOOD, ED (NOT ORDERABLE)  POC SARS CORONAVIRUS 2 AG -  ED  TROPONIN I (HIGH SENSITIVITY)    EKG EKG Interpretation  Date/Time:  Thursday May 14 2019 18:04:12 EST Ventricular Rate:  85 PR Interval:    QRS Duration: 85 QT Interval:  375 QTC Calculation: 446 R Axis:   58 Text  Interpretation: Sinus rhythm Low voltage, precordial leads Baseline wander in lead(s) V2 V5 No STEMI  Confirmed by Octaviano Glow [phone]) on 05/14/2019 7:20:34 PM   Radiology DG Chest 2 View  Result Date: 05/14/2019 CLINICAL DATA:  Chest pain and shortness of breath EXAM: CHEST - 2 VIEW COMPARISON:  CT chest January 23, 2017 FINDINGS: The heart size and mediastinal contours are within normal limits. Both lungs are clear. Degenerative joint changes of the spine are noted. IMPRESSION: No active cardiopulmonary disease. Electronically Signed   By: Abelardo Diesel M.D.   On: 05/14/2019 18:49   US Abdomen Limited RUQ  Result Date: 05/14/2019 CLINICAL DATA:  Abdomen pain for 5 days EXAM: ULTRASOUND ABDOMEN LIMITED RIGHT UPPER QUADRANT COMPARISON:  None. FINDINGS: Gallbladder: No gallstones or wall thickening visualized. No sonographic Murphy sign noted by sonographer. Common bile duct: Diameter: 3.5 mm. Liver: No focal lesion identified. Within normal limits in parenchymal echogenicity. Portal vein is patent on color Doppler imaging with normal direction of blood flow towards the liver. Other: none IMPRESSION: Normal gallbladder. No acute abnormality identified in the right upper quadrant. Electronically Signed   By: Abelardo Diesel M.D.   On: 05/14/2019 21:33    Procedures Procedures (including critical care time)  Medications Ordered in ED Medications  ondansetron [facility]) injection 4 mg (4 mg Intravenous Given 05/14/19 2044)  morphine 4 MG/ML injection 4 mg (4 mg Intravenous Given 05/14/19 2044)    ED Course  I have reviewed the triage vital signs and the nursing notes.  Pertinent labs & imaging results that were available during my care of the patient were reviewed by me and considered in my medical decision making (see chart for details).  53 yo female here with intermittent stabbing epigastric pain for the past several days.  At times the pain dissipates completely.  She does not report an  association with eating, but does demonstrate some mild to moderate epigastric and RUQ ttp, with a questionably positive Murphy's sign.  I'll add on LFT, lipase, and RUQ ultrasound to her triage workup.    Otherwise she shows no fever or leukocytosis to suggest infection.  No respiratory symptoms.  Rapid COVID test is negative (ordered given her reported headache along with her symptoms).  ECG and troponins are reassuring, I do not think this is ACS  Possibly this is related to her hiatal hernia, or an esophageal dysmotility issue, as she reports a choking sensation rising up into her throat, which is associated with her pain.  She should have this re-evaluated by GI if this ends up being an issue for her.  Valium may help with this if her workup is otherwise unremarkable in the ED.  I've ordered a dose of morphine and zofran given her nausea and discomfort level.  Clinical Course as of May 14 1126  Thu May 14, 2019  2150 IMPRESSION: Normal gallbladder. No acute abnormality identified in the right upper quadrant.   [MT]    Clinical Course User Index [MT] Lashayla Armes, Carola Rhine, MD    Final Clinical Impression(s) / ED Diagnoses Final diagnoses:  Colic  Epigastric pain    Rx / DC Orders ED Discharge Orders         Ordered    diazepam (VALIUM) 5 MG tablet  2 times daily     05/14/19 2222           Wyvonnia Dusky, MD 05/15/19 1128

## 2019-05-14 NOTE — Discharge Instructions (Signed)
Please follow-up with your primary care doctor tomorrow.  It is possible your pain is related to the hernia in your stomach.  You should eat light foods and fluids only for the next several days.  Avoid heavy foods meals.

## 2019-05-14 NOTE — ED Triage Notes (Signed)
Patient reports dull central chest pain today with SOB, nausea, and cough. Denies fevers. States seen at Urgent Care and told to come for evaluation with worsening pain.

## 2019-05-23 ENCOUNTER — Other Ambulatory Visit: Payer: Self-pay | Admitting: Family Medicine

## 2019-05-23 DIAGNOSIS — I10 Essential (primary) hypertension: Secondary | ICD-10-CM

## 2019-05-30 ENCOUNTER — Other Ambulatory Visit: Payer: Self-pay | Admitting: Family Medicine

## 2019-05-30 DIAGNOSIS — I10 Essential (primary) hypertension: Secondary | ICD-10-CM

## 2019-06-11 ENCOUNTER — Ambulatory Visit: Payer: BC Managed Care – PPO | Attending: Internal Medicine

## 2019-06-11 ENCOUNTER — Other Ambulatory Visit: Payer: Self-pay

## 2019-06-11 DIAGNOSIS — Z20822 Contact with and (suspected) exposure to covid-19: Secondary | ICD-10-CM

## 2019-06-13 ENCOUNTER — Encounter: Payer: Self-pay | Admitting: Family Medicine

## 2019-06-13 LAB — NOVEL CORONAVIRUS, NAA: SARS-CoV-2, NAA: NOT DETECTED

## 2019-06-15 ENCOUNTER — Other Ambulatory Visit: Payer: Self-pay

## 2019-06-15 DIAGNOSIS — R0602 Shortness of breath: Secondary | ICD-10-CM

## 2019-06-15 MED ORDER — ALBUTEROL SULFATE HFA 108 (90 BASE) MCG/ACT IN AERS: 2.0000 | INHALATION_SPRAY | RESPIRATORY_TRACT | 1 refills | Status: DC | PRN

## 2019-06-15 MED ORDER — EPIPEN 2-PAK 0.3 MG/0.3ML IJ SOAJ: 0.3000 mg | INTRAMUSCULAR | 0 refills | Status: DC | PRN

## 2019-06-29 ENCOUNTER — Ambulatory Visit
Admission: RE | Admit: 2019-06-29 | Discharge: 2019-06-29 | Disposition: A | Discharge status: Home / Self Care | Payer: BC Managed Care – PPO | Source: Ambulatory Visit | Attending: Family Medicine | Admitting: Family Medicine

## 2019-06-29 ENCOUNTER — Other Ambulatory Visit: Payer: Self-pay

## 2019-06-29 DIAGNOSIS — Z1231 Encounter for screening mammogram for malignant neoplasm of breast: Secondary | ICD-10-CM

## 2019-06-30 LAB — HM MAMMOGRAPHY

## 2019-07-03 ENCOUNTER — Other Ambulatory Visit: Payer: Self-pay | Admitting: Family Medicine

## 2019-07-03 DIAGNOSIS — E890 Postprocedural hypothyroidism: Secondary | ICD-10-CM

## 2019-07-08 ENCOUNTER — Telehealth: Payer: Self-pay | Admitting: Family Medicine

## 2019-07-08 NOTE — Telephone Encounter (Signed)
Pt would like to have her labs done on Friday after her CPE may I have orders placed?

## 2019-07-08 NOTE — Telephone Encounter (Signed)
Spoke with Marilyn [name] aware that Dr Volanda Napoleon will place lab orders after her CPE, Marilyn [name] can then come in following day and have labs drawn

## 2019-07-15 ENCOUNTER — Other Ambulatory Visit: Payer: Self-pay

## 2019-07-16 ENCOUNTER — Ambulatory Visit (INDEPENDENT_AMBULATORY_CARE_PROVIDER_SITE_OTHER): Payer: BC Managed Care – PPO | Admitting: Family Medicine

## 2019-07-16 ENCOUNTER — Encounter: Payer: Self-pay | Admitting: Family Medicine

## 2019-07-16 ENCOUNTER — Other Ambulatory Visit: Payer: Self-pay

## 2019-07-16 VITALS — BP 130/90 | HR 90 | Temp 97.6°F | Wt 203.0 lb

## 2019-07-16 DIAGNOSIS — Z0000 Encounter for general adult medical examination without abnormal findings: Secondary | ICD-10-CM

## 2019-07-16 DIAGNOSIS — D219 Benign neoplasm of connective and other soft tissue, unspecified: Secondary | ICD-10-CM

## 2019-07-16 DIAGNOSIS — I10 Essential (primary) hypertension: Secondary | ICD-10-CM

## 2019-07-16 DIAGNOSIS — R1310 Dysphagia, unspecified: Secondary | ICD-10-CM

## 2019-07-16 DIAGNOSIS — E669 Obesity, unspecified: Secondary | ICD-10-CM | POA: Diagnosis not present

## 2019-07-16 NOTE — Patient Instructions (Addendum)
Preventive Care 40-56 Years Old, Female Preventive care refers to visits with your health care provider and lifestyle choices that can promote health and wellness. This includes:  A yearly physical exam. This may also be called an annual well check.  Regular dental visits and eye exams.  Immunizations.  Screening for certain conditions.  Healthy lifestyle choices, such as eating a healthy diet, getting regular exercise, not using drugs or products that contain nicotine and tobacco, and limiting alcohol use. What can I expect for my preventive care visit? Physical exam Your health care provider will check your:  Height and weight. This may be used to calculate body mass index (BMI), which tells if you are at a healthy weight.  Heart rate and blood pressure.  Skin for abnormal spots. Counseling Your health care provider may ask you questions about your:  Alcohol, tobacco, and drug use.  Emotional well-being.  Home and relationship well-being.  Sexual activity.  Eating habits.  Work and work environment.  Method of birth control.  Menstrual cycle.  Pregnancy history. What immunizations do I need?  Influenza (flu) vaccine  This is recommended every year. Tetanus, diphtheria, and pertussis (Tdap) vaccine  You may need a Td booster every 10 years. Varicella (chickenpox) vaccine  You may need this if you have not been vaccinated. Zoster (shingles) vaccine  You may need this after age 60. Measles, mumps, and rubella (MMR) vaccine  You may need at least one dose of MMR if you were born in 1957 or later. You may also need a second dose. Pneumococcal conjugate (PCV13) vaccine  You may need this if you have certain conditions and were not previously vaccinated. Pneumococcal polysaccharide (PPSV23) vaccine  You may need one or two doses if you smoke cigarettes or if you have certain conditions. Meningococcal conjugate (MenACWY) vaccine  You may need this if you  have certain conditions. Hepatitis A vaccine  You may need this if you have certain conditions or if you travel or work in places where you may be exposed to hepatitis A. Hepatitis B vaccine  You may need this if you have certain conditions or if you travel or work in places where you may be exposed to hepatitis B. Haemophilus influenzae type b (Hib) vaccine  You may need this if you have certain conditions. Human papillomavirus (HPV) vaccine  If recommended by your health care provider, you may need three doses over 6 months. You may receive vaccines as individual doses or as more than one vaccine together in one shot (combination vaccines). Talk with your health care provider about the risks and benefits of combination vaccines. What tests do I need? Blood tests  Lipid and cholesterol levels. These may be checked every 5 years, or more frequently if you are over 50 years old.  Hepatitis C test.  Hepatitis B test. Screening  Lung cancer screening. You may have this screening every year starting at age 55 if you have a 30-pack-year history of smoking and currently smoke or have quit within the past 15 years.  Colorectal cancer screening. All adults should have this screening starting at age 50 and continuing until age 75. Your health care provider may recommend screening at age 45 if you are at increased risk. You will have tests every 1-10 years, depending on your results and the type of screening test.  Diabetes screening. This is done by checking your blood sugar (glucose) after you have not eaten for a while (fasting). You may have this   done every 1-3 years.  Mammogram. This may be done every 1-2 years. Talk with your health care provider about when you should start having regular mammograms. This may depend on whether you have a family history of breast cancer.  BRCA-related cancer screening. This may be done if you have a family history of breast, ovarian, tubal, or peritoneal  cancers.  Pelvic exam and Pap test. This may be done every 3 years starting at age 93. Starting at age 20, this may be done every 5 years if you have a Pap test in combination with an HPV test. Other tests  Sexually transmitted disease (STD) testing.  Bone density scan. This is done to screen for osteoporosis. You may have this scan if you are at high risk for osteoporosis. Follow these instructions at home: Eating and drinking  Eat a diet that includes fresh fruits and vegetables, whole grains, lean protein, and low-fat dairy.  Take vitamin and mineral supplements as recommended by your health care provider.  Do not drink alcohol if: ? Your health care provider tells you not to drink. ? You are pregnant, may be pregnant, or are planning to become pregnant.  If you drink alcohol: ? Limit how much you have to 0-1 drink a day. ? Be aware of how much alcohol is in your drink. In the [place] one drink equals one 12 oz bottle of beer (355 mL), one 5 oz glass of wine (148 mL), or one 1 oz glass of hard liquor (44 mL). Lifestyle  Take daily care of your teeth and gums.  Stay active. Exercise for at least 30 minutes on 5 or more days each week.  Do not use any products that contain nicotine or tobacco, such as cigarettes, e-cigarettes, and chewing tobacco. If you need help quitting, ask your health care provider.  If you are sexually active, practice safe sex. Use a condom or other form of birth control (contraception) in order to prevent pregnancy and STIs (sexually transmitted infections).  If told by your health care provider, take low-dose aspirin daily starting at age 60. What's next?  Visit your health care provider once a year for a well check visit.  Ask your health care provider how often you should have your eyes and teeth checked.  Stay up to date on all vaccines. This information is not intended to replace advice given to you by your health care provider. Make sure you  discuss any questions you have with your health care provider. Document Revised: 01/30/2018 Document Reviewed: 01/30/2018 Elsevier Patient Education  2020 [place] [employer] Calories are units of energy. Your body needs a certain amount of calories from food to keep you going throughout the day. When you eat more calories than your body needs, your body stores the extra calories as fat. When you eat fewer calories than your body needs, your body burns fat to get the energy it needs. Calorie counting means keeping track of how many calories you eat and drink each day. Calorie counting can be helpful if you need to lose weight. If you make sure to eat fewer calories than your body needs, you should lose weight. Ask your health care provider what a healthy weight is for you. For calorie counting to work, you will need to eat the right number of calories in a day in order to lose a healthy amount of weight per week. A dietitian can help you determine how many calories you need  in a day and will give you suggestions on how to reach your calorie goal.  A healthy amount of weight to lose per week is usually 1-2 lb (0.5-0.9 kg). This usually means that your daily calorie intake should be reduced by 500-750 calories.  Eating 1,200 - 1,500 calories per day can help most women lose weight.  Eating 1,500 - 1,800 calories per day can help most men lose weight. What is my plan? My goal is to have __________ calories per day. If I have this many calories per day, I should lose around __________ pounds per week. What do I need to know about calorie counting? In order to meet your daily calorie goal, you will need to:  Find out how many calories are in each food you would like to eat. Try to do this before you eat.  Decide how much of the food you plan to eat.  Write down what you ate and how many calories it had. Doing this is called keeping a food log. To successfully lose  weight, it is important to balance calorie counting with a healthy lifestyle that includes regular activity. Aim for 150 minutes of moderate exercise (such as walking) or 75 minutes of vigorous exercise (such as running) each week. Where do I find calorie information?  The number of calories in a food can be found on a Nutrition Facts label. If a food does not have a Nutrition Facts label, try to look up the calories online or ask your dietitian for help. Remember that calories are listed per serving. If you choose to have more than one serving of a food, you will have to multiply the calories per serving by the amount of servings you plan to eat. For example, the label on a package of bread might say that a serving size is 1 slice and that there are 90 calories in a serving. If you eat 1 slice, you will have eaten 90 calories. If you eat 2 slices, you will have eaten 180 calories. How do I keep a food log? Immediately after each meal, record the following information in your food log:  What you ate. Don't forget to include toppings, sauces, and other extras on the food.  How much you ate. This can be measured in cups, ounces, or number of items.  How many calories each food and drink had.  The total number of calories in the meal. Keep your food log near you, such as in a small notebook in your pocket, or use a mobile app or website. Some programs will calculate calories for you and show you how many calories you have left for the day to meet your goal. What are some calorie counting tips?   Use your calories on foods and drinks that will fill you up and not leave you hungry: ? Some examples of foods that fill you up are nuts and nut butters, vegetables, lean proteins, and high-fiber foods like whole grains. High-fiber foods are foods with more than 5 g fiber per serving. ? Drinks such as sodas, specialty coffee drinks, alcohol, and juices have a lot of calories, yet do not fill you up.  Eat  nutritious foods and avoid empty calories. Empty calories are calories you get from foods or beverages that do not have many vitamins or protein, such as candy, sweets, and soda. It is better to have a nutritious high-calorie food (such as an avocado) than a food with few nutrients (such as a bag  of chips).  Know how many calories are in the foods you eat most often. This will help you calculate calorie counts faster.  Pay attention to calories in drinks. Low-calorie drinks include water and unsweetened drinks.  Pay attention to nutrition labels for "low fat" or "fat free" foods. These foods sometimes have the same amount of calories or more calories than the full fat versions. They also often have added sugar, starch, or salt, to make up for flavor that was removed with the fat.  Find a way of tracking calories that works for you. Get creative. Try different apps or programs if writing down calories does not work for you. What are some portion control tips?  Know how many calories are in a serving. This will help you know how many servings of a certain food you can have.  Use a measuring cup to measure serving sizes. You could also try weighing out portions on a kitchen scale. With time, you will be able to estimate serving sizes for some foods.  Take some time to put servings of different foods on your favorite plates, bowls, and cups so you know what a serving looks like.  Try not to eat straight from a bag or box. Doing this can lead to overeating. Put the amount you would like to eat in a cup or on a plate to make sure you are eating the right portion.  Use smaller plates, glasses, and bowls to prevent overeating.  Try not to multitask (for example, watch TV or use your computer) while eating. If it is time to eat, sit down at a table and enjoy your food. This will help you to know when you are full. It will also help you to be aware of what you are eating and how much you are eating. What  are tips for following this plan? Reading food labels  Check the calorie count compared to the serving size. The serving size may be smaller than what you are used to eating.  Check the source of the calories. Make sure the food you are eating is high in vitamins and protein and low in saturated and trans fats. Shopping  Read nutrition labels while you shop. This will help you make healthy decisions before you decide to purchase your food.  Make a grocery list and stick to it. Cooking  Try to cook your favorite foods in a healthier way. For example, try baking instead of frying.  Use low-fat dairy products. Meal planning  Use more fruits and vegetables. Half of your plate should be fruits and vegetables.  Include lean proteins like poultry and fish. How do I count calories when eating out?  Ask for smaller portion sizes.  Consider sharing an entree and sides instead of getting your own entree.  If you get your own entree, eat only half. Ask for a box at the beginning of your meal and put the rest of your entree in it so you are not tempted to eat it.  If calories are listed on the menu, choose the lower calorie options.  Choose dishes that include vegetables, fruits, whole grains, low-fat dairy products, and lean protein.  Choose items that are boiled, broiled, grilled, or steamed. Stay away from items that are buttered, battered, fried, or served with cream sauce. Items labeled "crispy" are usually fried, unless stated otherwise.  Choose water, low-fat milk, unsweetened iced tea, or other drinks without added sugar. If you want an alcoholic beverage, choose a  lower calorie option such as a glass of wine or light beer.  Ask for dressings, sauces, and syrups on the side. These are usually high in calories, so you should limit the amount you eat.  If you want a salad, choose a garden salad and ask for grilled meats. Avoid extra toppings like bacon, cheese, or fried items. Ask for  the dressing on the side, or ask for olive oil and vinegar or lemon to use as dressing.  Estimate how many servings of a food you are given. For example, a serving of cooked rice is  cup or about the size of half a baseball. Knowing serving sizes will help you be aware of how much food you are eating at restaurants. The list below tells you how big or small some common portion sizes are based on everyday objects: ? 1 oz-4 stacked dice. ? 3 oz-1 deck of cards. ? 1 tsp-1 die. ? 1 Tbsp- a ping-pong ball. ? 2 Tbsp-1 ping-pong ball. ?  cup- baseball. ? 1 cup-1 baseball. Summary  Calorie counting means keeping track of how many calories you eat and drink each day. If you eat fewer calories than your body needs, you should lose weight.  A healthy amount of weight to lose per week is usually 1-2 lb (0.5-0.9 kg). This usually means reducing your daily calorie intake by 500-750 calories.  The number of calories in a food can be found on a Nutrition Facts label. If a food does not have a Nutrition Facts label, try to look up the calories online or ask your dietitian for help.  Use your calories on foods and drinks that will fill you up, and not on foods and drinks that will leave you hungry.  Use smaller plates, glasses, and bowls to prevent overeating. This information is not intended to replace advice given to you by your health care provider. Make sure you discuss any questions you have with your health care provider. Document Revised: 02/07/2018 Document Reviewed: 04/20/2016 Elsevier Patient Education  2020 [employer].  Exercising to Lose Weight Exercise is structured, repetitive physical activity to improve fitness and health. Getting regular exercise is important for everyone. It is especially important if you are overweight. Being overweight increases your risk of heart disease, stroke, diabetes, high blood pressure, and several types of cancer. Reducing your calorie intake and  exercising can help you lose weight. Exercise is usually categorized as moderate or vigorous intensity. To lose weight, most people need to do a certain amount of moderate-intensity or vigorous-intensity exercise each week. Moderate-intensity exercise  Moderate-intensity exercise is any activity that gets you moving enough to burn at least three times more energy (calories) than if you were sitting. Examples of moderate exercise include:  Walking a mile in 15 minutes.  Doing light yard work.  Biking at an easy pace. Most people should get at least 150 minutes (2 hours and 30 minutes) a week of moderate-intensity exercise to maintain their body weight. Vigorous-intensity exercise Vigorous-intensity exercise is any activity that gets you moving enough to burn at least six times more calories than if you were sitting. When you exercise at this intensity, you should be working hard enough that you are not able to carry on a conversation. Examples of vigorous exercise include:  Running.  Playing a team sport, such as football, basketball, and soccer.  Jumping rope. Most people should get at least 75 minutes (1 hour and 15 minutes) a week of vigorous-intensity exercise to maintain  their body weight. How can exercise affect me? When you exercise enough to burn more calories than you eat, you lose weight. Exercise also reduces body fat and builds muscle. The more muscle you have, the more calories you burn. Exercise also:  Improves mood.  Reduces stress and tension.  Improves your overall fitness, flexibility, and endurance.  Increases bone strength. The amount of exercise you need to lose weight depends on:  Your age.  The type of exercise.  Any health conditions you have.  Your overall physical ability. Talk to your health care provider about how much exercise you need and what types of activities are safe for you. What actions can I take to lose weight? Nutrition   Make  changes to your diet as told by your health care provider or diet and nutrition specialist (dietitian). This may include: ? Eating fewer calories. ? Eating more protein. ? Eating less unhealthy fats. ? Eating a diet that includes fresh fruits and vegetables, whole grains, low-fat dairy products, and lean protein. ? Avoiding foods with added fat, salt, and sugar.  Drink plenty of water while you exercise to prevent dehydration or heat stroke. Activity  Choose an activity that you enjoy and set realistic goals. Your health care provider can help you make an exercise plan that works for you.  Exercise at a moderate or vigorous intensity most days of the week. ? The intensity of exercise may vary from person to person. You can tell how intense a workout is for you by paying attention to your breathing and heartbeat. Most people will notice their breathing and heartbeat get faster with more intense exercise.  Do resistance training twice each week, such as: ? Push-ups. ? Sit-ups. ? Lifting weights. ? Using resistance bands.  Getting short amounts of exercise can be just as helpful as long structured periods of exercise. If you have trouble finding time to exercise, try to include exercise in your daily routine. ? Get up, stretch, and walk around every 30 minutes throughout the day. ? Go for a walk during your lunch break. ? Park your car farther away from your destination. ? If you take public transportation, get off one stop early and walk the rest of the way. ? Make phone calls while standing up and walking around. ? Take the stairs instead of elevators or escalators.  Wear comfortable clothes and shoes with good support.  Do not exercise so much that you hurt yourself, feel dizzy, or get very short of breath. Where to find more information  [place]. Department of Health and Human Services: [employer]  Centers for Disease Control and Prevention (CDC): http://www.wolf.info/ Contact a health care  provider:  Before starting a new exercise program.  If you have questions or concerns about your weight.  If you have a medical problem that keeps you from exercising. Get help right away if you have any of the following while exercising:  Injury.  Dizziness.  Difficulty breathing or shortness of breath that does not go away when you stop exercising.  Chest pain.  Rapid heartbeat. Summary  Being overweight increases your risk of heart disease, stroke, diabetes, high blood pressure, and several types of cancer.  Losing weight happens when you burn more calories than you eat.  Reducing the amount of calories you eat in addition to getting regular moderate or vigorous exercise each week helps you lose weight. This information is not intended to replace advice given to you by your health care provider. Make  sure you discuss any questions you have with your health care provider. Document Revised: 06/03/2017 Document Reviewed: 06/03/2017 Elsevier Patient Education  [place].  Dysphagia  Dysphagia is trouble swallowing. This condition occurs when solids and liquids stick in a person's throat on the way down to the stomach, or when food takes longer to get to the stomach than usual. You may have problems swallowing food, liquids, or both. You may also have pain while trying to swallow. It may take you more time and effort to swallow something. What are the causes? This condition may be caused by:  Muscle problems. They may make it difficult for you to move food and liquids through the esophagus, which is the tube that connects your mouth to your stomach.  Blockages. You may have ulcers, scar tissue, or inflammation that blocks the normal passage of food and liquids. Causes of these problems include: ? Acid reflux from your stomach into your esophagus (gastroesophageal reflux). ? Infections. ? Radiation treatment for cancer. ? Medicines taken without enough fluids to wash  them down into your stomach.  Stroke. This can affect the nerves and make it difficult to swallow.  Nerve problems. These prevent signals from being sent to the muscles of your esophagus to squeeze (contract) and move what you swallow down to your stomach.  Globus pharyngeus. This is a common problem that involves a feeling like something is stuck in your throat or a sense of trouble with swallowing, even though nothing is wrong with the swallowing passages.  Certain conditions, such as cerebral palsy or Parkinson's disease. What are the signs or symptoms? Common symptoms of this condition include:  A feeling that solids or liquids are stuck in your throat on the way down to the stomach.  Pain while swallowing.  Coughing or gagging while trying to swallow. Other symptoms include:  Food moving back from your stomach to your mouth (regurgitation).  Noises coming from your throat.  Chest discomfort with swallowing.  A feeling of fullness when swallowing.  Drooling, especially when the throat is blocked.  Heartburn. How is this diagnosed? This condition may be diagnosed by:  Barium X-ray. In this test, you will swallow a white liquid that sticks to the inside of your esophagus. X-ray images are then taken.  Endoscopy. In this test, a flexible telescope is inserted down your throat to look at your esophagus and your stomach.  CT scans and an MRI. How is this treated? Treatment for dysphagia depends on the cause of this condition, such as:  If the dysphagia is caused by acid reflux or infection, medicines may be used. They may include antibiotics and heartburn medicines.  If the dysphagia is caused by problems with the muscles, swallowing therapy may be used to help you strengthen your swallowing muscles. You may have to do specific exercises to strengthen the muscles or stretch them.  If the dysphagia is caused by a blockage or mass, procedures to remove the blockage may be  done. You may need surgery and a feeding tube. You may need to make diet changes. Ask your health care provider for specific instructions. Follow these instructions at home: Medicines  Take over-the-counter and prescription medicines only as told by your health care provider.  If you were prescribed an antibiotic medicine, take it as told by your health care provider. Do not stop taking the antibiotic even if you start to feel better. Eating and drinking   Follow any diet changes as told by your  health care provider.  Work with a diet and nutrition specialist (dietitian) to create an eating plan that will help you get the nutrients you need in order to stay healthy.  Eat soft foods that are easier to swallow.  Cut your food into small pieces and eat slowly. Take small bites.  Eat and drink only when you are sitting upright.  Do not drink alcohol or caffeine. If you need help quitting, ask your health care provider. General instructions  Check your weight every day to make sure you are not losing weight.  Do not use any products that contain nicotine or tobacco, such as cigarettes, e-cigarettes, and chewing tobacco. If you need help quitting, ask your health care provider.  Keep all follow-up visits as told by your health care provider. This is important. Contact a health care provider if you:  Lose weight because you cannot swallow.  Cough when you drink liquids.  Cough up partially digested food. Get help right away if you:  Cannot swallow your saliva.  Have shortness of breath, a fever, or both.  Have a hoarse voice and also have trouble swallowing. Summary  Dysphagia is trouble swallowing. This condition occurs when solids and liquids stick in a person's throat on the way down to the stomach. You may cough or gag while trying to swallow.  Dysphagia has many possible causes.  Treatment for dysphagia depends on the cause of the condition.  Keep all follow-up visits  as told by your health care provider. This is important. This information is not intended to replace advice given to you by your health care provider. Make sure you discuss any questions you have with your health care provider. Document Revised: 10/15/2018 Document Reviewed: 10/15/2018 Elsevier Patient Education  [place].

## 2019-07-16 NOTE — Progress Notes (Signed)
Subjective:     Marilyn [name] is a 54 y.o. female and is here for a comprehensive physical exam. The patient reports problems - thyroid concern.  Soreness of throat and dysphagia with taking capsules.  Sensation is similar to when pt had thyroid issues.  Pt taking prilosec.   Pt also notes h/o fibroids.  Interested in f/u with OB/Gyn.  Social History   Socioeconomic History  . Marital status: Single    Spouse name: Not on file  . Number of children: 3  . Years of education: Not on file  . Highest education level: Not on file  Occupational History  . 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    Employer: [employer]  Tobacco Use  . Smoking status: Former Smoker    Packs/day: 0.25    Years: 3.00    Pack years: 0.75    Types: Cigarettes    Quit date: 06/04/1980    Years since quitting: 39.1  . Smokeless tobacco: Never Used  Substance and Sexual Activity  . Alcohol use: No    Alcohol/week: 0.0 standard drinks  . Drug use: No  . Sexual activity: Not on file  Other Topics Concern  . Not on file  Social History Narrative   Married   Social Determinants of Health   Financial Resource Strain:   . Difficulty of Paying Living Expenses: Not on file  Food Insecurity:   . Worried About Charity fundraiser in the Last Year: Not on file  . Ran Out of Food in the Last Year: Not on file  Transportation Needs:   . Lack of Transportation (Medical): Not on file  . Lack of Transportation (Non-Medical): Not on file  Physical Activity:   . Days of Exercise per Week: Not on file  . Minutes of Exercise per Session: Not on file  Stress:   . Feeling of Stress : Not on file  Social Connections:   . Frequency of Communication with Friends and Family: Not on file  . Frequency of Social Gatherings with Friends and Family: Not on file  . Attends Religious Services: Not on file  . Active Member of Clubs or Organizations: Not on file  . Attends Archivist Meetings: Not on file  . Marital  Status: Not on file  Intimate Partner Violence:   . Fear of Current or Ex-Partner: Not on file  . Emotionally Abused: Not on file  . Physically Abused: Not on file  . Sexually Abused: Not on file   Health Maintenance  Topic Date Due  . Hepatitis C Screening  04/13/1966  . INFLUENZA VACCINE  01/03/2019  . TETANUS/TDAP  12/23/2019  . PAP SMEAR-Modifier  02/21/2020  . COLONOSCOPY  12/24/2020  . MAMMOGRAM  06/28/2021  . HIV Screening  Completed    The following portions of the patient's history were reviewed and updated as appropriate: allergies, current medications, past family history, past medical history, past social history, past surgical history and problem list.  Review of Systems Pertinent items noted in HPI and remainder of comprehensive ROS otherwise negative.   Objective:    BP 130/90 (BP Location: Left Arm, Patient Position: Sitting, Cuff Size: Large)   Pulse 90   Temp 97.6 F (36.4 C) (Temporal)   Wt 203 lb (92.1 kg)   LMP 03/18/2014   SpO2 98%   BMI 35.96 kg/m  General appearance: alert, cooperative and no distress Head: Normocephalic, without obvious abnormality, atraumatic Eyes: conjunctivae/corneas clear. PERRL, EOM's intact. Fundi benign. Ears:  normal TM's and external ear canals both ears Nose: Nares normal. Septum midline. Mucosa normal. No drainage or sinus tenderness. Throat: lips, mucosa, and tongue normal; teeth and gums normal Neck: no adenopathy, no carotid bruit, no JVD, supple, symmetrical, trachea midline and thyroid not enlarged, symmetric, no tenderness/mass/nodules Lungs: clear to auscultation bilaterally Heart: regular rate and rhythm, S1, S2 normal, no murmur, click, rub or gallop Abdomen: soft, non-tender; bowel sounds normal; no masses,  no organomegaly Extremities: extremities normal, atraumatic, no cyanosis or edema Pulses: 2+ and symmetric Skin: Skin color, texture, turgor normal. No rashes or lesions Lymph nodes: Cervical,  supraclavicular, and axillary nodes normal. Neurologic: Alert and oriented X 3, normal strength and tone. Normal symmetric reflexes. Normal coordination and gait    Assessment:    Healthy female exam with dysphagia.     Plan:     Anticipatory guidance given including wearing seatbelts, smoke detectors in the home, increasing physical activity, increasing p.o. intake of water and vegetables. -will obtain labs -pap due 02/2020. -colonoscopy due 12/24/2020 -given handout -next CPE in 1 yr See After Visit Summary for Counseling Recommendations    Dysphagia, unspecified type  -will obtain labs as sensation similar to symptoms when dx'd with hypothyroidism. -continue f/u with GI -continue priolosec - Plan: TSH, T4, Free  Essential hypertension  -elevated -recheck bp -discussed lifestyle modifications -continue norvasc 10 mg,  - Plan: Basic metabolic panel  Fibroids  - Plan: CBC (no diff), Ambulatory referral to Obstetrics / Gynecology  Obesity without serious comorbidity, unspecified classification, unspecified obesity type - Plan: Hemoglobin A1c, Lipid panel  F/u in the next few months  Grier Mitts, MD

## 2019-07-17 ENCOUNTER — Encounter: Payer: Self-pay | Admitting: Family Medicine

## 2019-07-17 ENCOUNTER — Other Ambulatory Visit: Payer: BC Managed Care – PPO

## 2019-07-17 LAB — CBC
HCT: 37.3 % (ref 36.0–46.0)
Hemoglobin: 12.3 g/dL (ref 12.0–15.0)
MCHC: 32.8 g/dL (ref 30.0–36.0)
MCV: 83.3 fl (ref 78.0–100.0)
Platelets: 237 10*3/uL (ref 150.0–400.0)
RBC: 4.48 Mil/uL (ref 3.87–5.11)
RDW: 13.6 % (ref 11.5–15.5)
WBC: 3.9 10*3/uL — ABNORMAL LOW (ref 4.0–10.5)

## 2019-07-17 LAB — T4, FREE: Free T4: 0.83 ng/dL (ref 0.60–1.60)

## 2019-07-17 LAB — BASIC METABOLIC PANEL
BUN: 11 mg/dL (ref 6–23)
CO2: 30 mEq/L (ref 19–32)
Calcium: 8.7 mg/dL (ref 8.4–10.5)
Chloride: 105 mEq/L (ref 96–112)
Creatinine, Ser: 0.88 mg/dL (ref 0.40–1.20)
GFR: 80.54 mL/min (ref >60.00)
Glucose, Bld: 100 mg/dL — ABNORMAL HIGH (ref 70–99)
Potassium: 4.5 mEq/L (ref 3.5–5.1)
Sodium: 141 mEq/L (ref 135–145)

## 2019-07-17 LAB — LIPID PANEL
Cholesterol: 178 mg/dL (ref 0–200)
HDL: 46.2 mg/dL (ref >39.00)
LDL Cholesterol: 114 mg/dL — ABNORMAL HIGH (ref 0–99)
NonHDL: 131.48
Total CHOL/HDL Ratio: 4
Triglycerides: 89 mg/dL (ref 0.0–149.0)
VLDL: 17.8 mg/dL (ref 0.0–40.0)

## 2019-07-17 LAB — TSH: TSH: 1.06 u[IU]/mL (ref 0.35–4.50)

## 2019-07-17 LAB — HEMOGLOBIN A1C: Hgb A1c MFr Bld: 6.6 % — ABNORMAL HIGH (ref 4.6–6.5)

## 2019-07-20 DIAGNOSIS — E119 Type 2 diabetes mellitus without complications: Secondary | ICD-10-CM | POA: Insufficient documentation

## 2019-07-23 ENCOUNTER — Encounter: Payer: Self-pay | Admitting: Family Medicine

## 2019-07-30 ENCOUNTER — Emergency Department (HOSPITAL_COMMUNITY): Payer: BC Managed Care – PPO

## 2019-07-30 ENCOUNTER — Other Ambulatory Visit: Payer: Self-pay

## 2019-07-30 ENCOUNTER — Emergency Department (HOSPITAL_COMMUNITY)
Admission: EM | Admit: 2019-07-30 | Discharge: 2019-07-30 | Disposition: A | Discharge status: Home / Self Care | Payer: BC Managed Care – PPO | Attending: Emergency Medicine | Admitting: Emergency Medicine

## 2019-07-30 ENCOUNTER — Encounter (HOSPITAL_COMMUNITY): Payer: Self-pay | Admitting: Emergency Medicine

## 2019-07-30 DIAGNOSIS — E119 Type 2 diabetes mellitus without complications: Secondary | ICD-10-CM | POA: Insufficient documentation

## 2019-07-30 DIAGNOSIS — R519 Headache, unspecified: Secondary | ICD-10-CM | POA: Diagnosis not present

## 2019-07-30 DIAGNOSIS — R1013 Epigastric pain: Secondary | ICD-10-CM | POA: Diagnosis present

## 2019-07-30 DIAGNOSIS — I10 Essential (primary) hypertension: Secondary | ICD-10-CM | POA: Insufficient documentation

## 2019-07-30 DIAGNOSIS — Z79899 Other long term (current) drug therapy: Secondary | ICD-10-CM | POA: Diagnosis not present

## 2019-07-30 DIAGNOSIS — Z87891 Personal history of nicotine dependence: Secondary | ICD-10-CM | POA: Diagnosis not present

## 2019-07-30 DIAGNOSIS — J45909 Unspecified asthma, uncomplicated: Secondary | ICD-10-CM | POA: Diagnosis not present

## 2019-07-30 DIAGNOSIS — E039 Hypothyroidism, unspecified: Secondary | ICD-10-CM | POA: Insufficient documentation

## 2019-07-30 DIAGNOSIS — R42 Dizziness and giddiness: Secondary | ICD-10-CM | POA: Diagnosis not present

## 2019-07-30 DIAGNOSIS — R109 Unspecified abdominal pain: Secondary | ICD-10-CM

## 2019-07-30 LAB — CBC
HCT: 38.3 % (ref 36.0–46.0)
Hemoglobin: 12.3 g/dL (ref 12.0–15.0)
MCH: 27.1 pg (ref 26.0–34.0)
MCHC: 32.1 g/dL (ref 30.0–36.0)
MCV: 84.4 fL (ref 80.0–100.0)
Platelets: 242 10*3/uL (ref 150–400)
RBC: 4.54 MIL/uL (ref 3.87–5.11)
RDW: 12.9 % (ref 11.5–15.5)
WBC: 4.4 10*3/uL (ref 4.0–10.5)
nRBC: 0 % (ref 0.0–0.2)

## 2019-07-30 LAB — COMPREHENSIVE METABOLIC PANEL
ALT: 21 U/L (ref 0–44)
AST: 24 U/L (ref 15–41)
Albumin: 4.1 g/dL (ref 3.5–5.0)
Alkaline Phosphatase: 67 U/L (ref 38–126)
Anion gap: 10 (ref 5–15)
BUN: 13 mg/dL (ref 6–20)
CO2: 24 mmol/L (ref 22–32)
Calcium: 8.9 mg/dL (ref 8.9–10.3)
Chloride: 103 mmol/L (ref 98–111)
Creatinine, Ser: 0.88 mg/dL (ref 0.44–1.00)
GFR calc Af Amer: >60 mL/min (ref >60)
GFR calc non Af Amer: >60 mL/min (ref >60)
Glucose, Bld: 122 mg/dL — ABNORMAL HIGH (ref 70–99)
Potassium: 3.8 mmol/L (ref 3.5–5.1)
Sodium: 137 mmol/L (ref 135–145)
Total Bilirubin: 0.3 mg/dL (ref 0.3–1.2)
Total Protein: 8 g/dL (ref 6.5–8.1)

## 2019-07-30 LAB — URINALYSIS, ROUTINE W REFLEX MICROSCOPIC
Bilirubin Urine: NEGATIVE
Glucose, UA: NEGATIVE mg/dL
Hgb urine dipstick: NEGATIVE
Ketones, ur: NEGATIVE mg/dL
Leukocytes,Ua: NEGATIVE
Nitrite: NEGATIVE
Protein, ur: NEGATIVE mg/dL
Specific Gravity, Urine: 1.009 (ref 1.005–1.030)
pH: 7 (ref 5.0–8.0)

## 2019-07-30 LAB — LIPASE, BLOOD: Lipase: 28 U/L (ref 11–51)

## 2019-07-30 MED ORDER — ONDANSETRON 4 MG PO TBDP: 4.0000 mg | ORAL_TABLET | Freq: Three times a day (TID) | ORAL | 0 refills | Status: DC | PRN

## 2019-07-30 MED ORDER — PROCHLORPERAZINE EDISYLATE 10 MG/2ML IJ SOLN
10.0000 mg | Freq: Once | INTRAMUSCULAR | Status: AC
  Administered 2019-07-30: 17:00:00 10 mg via INTRAVENOUS
  Filled 2019-07-30: qty 2

## 2019-07-30 MED ORDER — IOHEXOL 350 MG/ML SOLN
100.0000 mL | Freq: Once | INTRAVENOUS | Status: AC | PRN
  Administered 2019-07-30: 100 mL via INTRAVENOUS

## 2019-07-30 MED ORDER — SODIUM CHLORIDE 0.9% FLUSH: 3.0000 mL | Freq: Once | INTRAVENOUS | Status: DC

## 2019-07-30 MED ORDER — SODIUM CHLORIDE 0.9 % IV BOLUS
500.0000 mL | Freq: Once | INTRAVENOUS | Status: AC
  Administered 2019-07-30: 500 mL via INTRAVENOUS

## 2019-07-30 NOTE — ED Provider Notes (Signed)
[facility] EMERGENCY DEPARTMENT Provider Note   CSN: [id number] Arrival date & time: 07/30/19  1442     History Chief Complaint  Patient presents with   Abdominal Pain   Headache    Marilyn [name] is a 54 y.o. female.  HPI Patient was eating lunch today.  Had some chicken at the cafeteria and then developed some epigastric pain and nausea.  States she began to feel dizzy.  States it felt as if the room is moving around her.  Dull headache on her head.  It began acutely.  States that she began to have some pain in her epigastric area 2.  States she had to put her head down on the desk.  States she felt lightheaded.  No numbness or weakness.  States she was able to walk into the car.  Does not tend to get headaches.  Epigastric pain is dull in the upper abdomen.  No diarrhea or constipation. States she is allergic to medicine with a P that appears to be promethazine.  States it made her retain urine and her kidneys shut down.    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    Patient Active Problem List   Diagnosis Date Noted   Diet-controlled type 2 diabetes mellitus ([place]) 07/20/2019   Swollen neck 04/09/2018   Urinary retention 08/05/2017   Atypical chest pain 01/23/2017   Nausea & vomiting 07/16/2015   RUQ abdominal pain 07/16/2015   RLQ abdominal pain 07/15/2015   Pain in lower back 11/30/2014   Acute upper respiratory infection 05/05/2014   Allergic rhinitis 05/05/2014   Hoarseness 05/05/2014   Dysphagia 05/05/2014   Obesity 04/11/2014   Edema, peripheral 04/11/2014   Cough 12/20/2012   Hypersomnolence 12/15/2012   DOE  (dyspnea on exertion) 12/15/2012   Right shoulder pain 12/15/2012   Abdominal pain 11/15/2011   HTN (hypertension) 09/09/2011   Neck pain 09/09/2011   Preventative health care 04/30/2011   HOT FLASHES 12/22/2009   CHEST PAIN 01/29/2009   Acute non-recurrent maxillary sinusitis 09/25/2008   HYPERTHYROIDISM 01/31/2007   GERD 01/31/2007   Hypothyroidism 01/30/2007   Hyperlipidemia 01/30/2007   Iron deficiency anemia 01/30/2007   ASTHMA 01/30/2007    Past Surgical History:  Procedure Laterality Date   BREAST BIOPSY Right 12/2014   BREAST EXCISIONAL BIOPSY Right 2016   BREAST LUMPECTOMY WITH RADIOACTIVE SEED LOCALIZATION Right 02/25/2015   Procedure: BREAST LUMPECTOMY WITH RADIOACTIVE SEED LOCALIZATION;  Surgeon: Alphonsa Overall, MD;  Location: [place];  Service: General;  Laterality: Right;   BREAST REDUCTION SURGERY  1986   BREAST SURGERY     broken jaw     CARPAL TUNNEL RELEASE  2001   FRACTURE SURGERY     REDUCTION MAMMAPLASTY Bilateral    THYROIDECTOMY     TUBAL LIGATION  1987     OB History   No obstetric history on file.     Family History  Problem Relation Age of Onset   Hypertension Father    Diabetes Father    Hyperlipidemia Father    Asthma Mother    Breast cancer Sister  Thyroid disease Sister    Diabetes Maternal Grandmother    Heart disease Maternal Grandmother    Heart attack Maternal Grandmother 60   Asthma Maternal Grandfather    Heart attack Maternal Grandfather    Colon cancer Neg Hx     Social History   Tobacco Use   Smoking status: Former Smoker    Packs/day: 0.25    Years: 3.00    Pack years: 0.75    Types: Cigarettes    Quit date: 06/04/1980    Years since quitting: 39.1   Smokeless tobacco: Never Used  Substance Use Topics   Alcohol use: No    Alcohol/week: 0.0 standard drinks   Drug use: No    Home Medications Prior to Admission medications   Medication Sig Start Date  End Date Taking? Authorizing Provider  acetaminophen (TYLENOL) 500 MG tablet Take 500 mg by mouth as needed for moderate pain (arthritis pain).   Yes [provider]  albuterol (VENTOLIN HFA) 108 (90 Base) MCG/ACT inhaler Inhale 2 puffs into the lungs every 4 (four) hours as needed for wheezing or shortness of breath (cough, shortness of breath or wheezing.). 06/15/19  Yes Billie Ruddy, MD  amLODipine (NORVASC) 10 MG tablet TAKE ONE TABLET BY MOUTH DAILY Patient taking differently: Take 10 mg by mouth daily.  05/25/19  Yes Billie Ruddy, MD  atorvastatin (LIPITOR) 40 MG tablet TAKE ONE TABLET BY MOUTH DAILY Patient taking differently: Take 40 mg by mouth at bedtime.  03/23/19  Yes Billie Ruddy, MD  azelastine (ASTELIN) 0.1 % nasal spray Place 1 spray into both nostrils 2 (two) times daily as needed for allergies.  12/09/18  Yes [provider]  Cholecalciferol (VITAMIN D) 125 MCG (5000 UT) CAPS Take 1 tablet by mouth daily.   Yes [provider]  EPIPEN 2-PAK 0.3 MG/0.3ML SOAJ injection Inject 0.3 mLs (0.3 mg total) into the muscle as needed for anaphylaxis. 06/15/19  Yes Billie Ruddy, MD  levothyroxine (SYNTHROID) 100 MCG tablet TAKE ONE TABLET BY MOUTH DAILY Patient taking differently: Take 100 mcg by mouth daily.  07/03/19  Yes Billie Ruddy, MD  omeprazole (PRILOSEC) 40 MG capsule Take 1 capsule (40 mg total) by mouth 2 (two) times a day. 12/24/18  Yes Ladene Artist, MD  polyethylene glycol powder (GLYCOLAX/MIRALAX) powder Take 17 g by mouth daily. Patient taking differently: Take 17 g by mouth daily as needed for mild constipation.  12/27/17  Yes Jaynee Eagles, PA-C  Probiotic Product (PROBIOTIC PO) Take 2 tablets by mouth daily.   Yes [provider]  sucralfate (CARAFATE) 1 g tablet Take 1 tablet (1 g total) by mouth 4 (four) times daily -  with meals and at bedtime. 04/10/18 07/30/19 Yes Zehr, Laban Emperor, PA-C  vitamin B-12 (CYANOCOBALAMIN) 500  MCG tablet Take 500 mcg by mouth daily.   Yes [provider]  cyclobenzaprine (FLEXERIL) 5 MG tablet Take 1 tablet (5 mg total) by mouth 3 (three) times daily as needed. Patient not taking: Reported on 07/30/2019 12/27/17   Jaynee Eagles, PA-C  hydrOXYzine (ATARAX/VISTARIL) 50 MG tablet Take 1 tablet (50 mg total) by mouth 3 (three) times daily as needed. Patient not taking: Reported on 07/30/2019 02/04/19   Billie Ruddy, MD  meloxicam (MOBIC) 7.5 MG tablet Take 1 tablet (7.5 mg total) by mouth daily. Patient not taking: Reported on 07/30/2019 12/27/17   Jaynee Eagles, PA-C  ondansetron (ZOFRAN-ODT) 4 MG disintegrating tablet Take 1 tablet (4  mg total) by mouth every 8 (eight) hours as needed for nausea or vomiting. 07/30/19   Davonna Belling, MD    Allergies    Promethazine-dm, Shellfish allergy, Blain Pais allergy], Codeine, and Flonase [fluticasone propionate]  Review of Systems   Review of Systems  Constitutional: Negative for appetite change.  HENT: Negative for congestion.   Respiratory: Negative for shortness of breath.   Cardiovascular: Negative for chest pain.  Gastrointestinal: Positive for abdominal pain.  Musculoskeletal: Negative for back pain.  Skin: Negative for rash.  Neurological: Positive for dizziness and headaches. Negative for weakness.  Psychiatric/Behavioral: Negative for confusion.    Physical Exam Updated Vital Signs BP 132/88    Pulse 85    Temp 97.9 F (36.6 C) (Oral)    Resp 18    Ht 5\' 6"  (1.676 m)    Wt 101.2 kg    LMP 03/18/2014    SpO2 96%    BMI 35.99 kg/m   Physical Exam Vitals and nursing note reviewed.  HENT:     Head: Atraumatic.  Cardiovascular:     Rate and Rhythm: Normal rate and regular rhythm.  Pulmonary:     Breath sounds: Normal breath sounds.  Abdominal:     Hernia: No hernia is present.     Comments: Mild epigastric tenderness.  No rebound guarding or mass  Skin:    General: Skin is warm.     Capillary Refill: Capillary  refill takes less than 2 seconds.  Neurological:     Mental Status: She is alert.     Comments: Face symmetric.  Finger-nose intact.  Nystagmus particularly with gaze to the right but also some to the left.     ED Results / Procedures / Treatments   Labs (all labs ordered are listed, but only abnormal results are displayed) Labs Reviewed  COMPREHENSIVE METABOLIC PANEL - Abnormal; Notable for the following components:      Result Value   Glucose, Bld 122 (*)    All other components within normal limits  URINALYSIS, ROUTINE W REFLEX MICROSCOPIC - Abnormal; Notable for the following components:   Color, Urine STRAW (*)    All other components within normal limits  LIPASE, BLOOD  CBC    EKG None  Radiology CT Angio Head W or Wo Contrast  Result Date: 07/30/2019 CLINICAL DATA:  Vertigo, headache EXAM: CT ANGIOGRAPHY HEAD TECHNIQUE: Multidetector CT imaging of the head was performed using the standard protocol during bolus administration of intravenous contrast. Multiplanar CT image reconstructions and MIPs were obtained to evaluate the vascular anatomy. CONTRAST:  149mL OMNIPAQUE IOHEXOL 350 MG/ML SOLN COMPARISON:  None. FINDINGS: CT HEAD Brain: There is no acute intracranial hemorrhage, mass effect, or edema. Gray-white differentiation is preserved. Ventricles and sulci are normal in size and configuration. There is no extra-axial fluid collection. Vascular: Better evaluated on CTA. Skull: Unremarkable. Sinuses: No significant mucosal thickening. Orbits: Unremarkable. CTA HEAD Anterior circulation: Intracranial internal carotid arteries are patent. Anterior and middle cerebral arteries are patent. Anterior communicating artery is present. Posterior circulation: Intracranial vertebral arteries, basilar artery, and posterior cerebral arteries are patent. There are patent bilateral posterior communicating arteries. Venous sinuses: Not well opacified. IMPRESSION: No acute intracranial  abnormality.  No aneurysm. Electronically Signed   By: Macy Mis M.D.   On: 07/30/2019 18:02    Procedures Procedures (including critical care time)  Medications Ordered in ED Medications  sodium chloride flush (NS) 0.9 % injection 3 mL (has no administration in time range)  prochlorperazine (COMPAZINE) injection 10 mg (10 mg Intravenous Given 07/30/19 1632)  sodium chloride 0.9 % bolus 500 mL (0 mLs Intravenous Stopped 07/30/19 1807)  iohexol (OMNIPAQUE) 350 MG/ML injection 100 mL (100 mLs Intravenous Contrast Given 07/30/19 1745)    ED Course  I have reviewed the triage vital signs and the nursing notes.  Pertinent labs & imaging results that were available during my care of the patient were reviewed by me and considered in my medical decision making (see chart for details).    MDM Rules/Calculators/A&P                      Patient presented with acute headache and vertigo.  Began suddenly.  Also upper abdominal pain.  Patient is worried it came from some chicken that she had eaten.  Head CT and CTA done due to worry for subarachnoid hemorrhage since acute onset with some neuro deficit in the vertigo.  CT was done within 6 hours of start of pain and CTA also did not show aneurysm.  Feels better after symptomatic treatment.  Discussed with patient and with the headache improved and vertigo improved feel this is less likely stroke or subarachnoid.  Does not appear to need MRI at this time.  Will discharge home with symptomatic treatment and outpatient follow-up. Final Clinical Impression(s) / ED Diagnoses Final diagnoses:  Nonintractable headache, unspecified chronicity pattern, unspecified headache type  Vertigo  Abdominal pain, unspecified abdominal location    Rx / DC Orders ED Discharge Orders         Ordered    ondansetron (ZOFRAN-ODT) 4 MG disintegrating tablet  Every 8 hours PRN     07/30/19 Carole Civil, MD 07/30/19 1859

## 2019-07-30 NOTE — ED Triage Notes (Signed)
Pt arrives to ED from work with complaints of sudden sharp frontal headache today that started at 1230. Patient stated that after the headache she began to have burning epigastric abdominal pain. 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 Patient has history of hiatal hernia.

## 2019-08-03 ENCOUNTER — Ambulatory Visit: Payer: BC Managed Care – PPO | Attending: Internal Medicine

## 2019-08-03 DIAGNOSIS — Z20822 Contact with and (suspected) exposure to covid-19: Secondary | ICD-10-CM

## 2019-08-04 ENCOUNTER — Other Ambulatory Visit: Payer: Self-pay

## 2019-08-04 LAB — NOVEL CORONAVIRUS, NAA: SARS-CoV-2, NAA: NOT DETECTED

## 2019-08-05 ENCOUNTER — Encounter: Payer: Self-pay | Admitting: Obstetrics and Gynecology

## 2019-08-05 ENCOUNTER — Ambulatory Visit: Payer: BC Managed Care – PPO | Admitting: Obstetrics and Gynecology

## 2019-08-05 VITALS — BP 130/78 | Ht 63.0 in | Wt 197.0 lb

## 2019-08-05 DIAGNOSIS — R102 Pelvic and perineal pain: Secondary | ICD-10-CM | POA: Diagnosis not present

## 2019-08-05 DIAGNOSIS — Z01419 Encounter for gynecological examination (general) (routine) without abnormal findings: Secondary | ICD-10-CM | POA: Diagnosis not present

## 2019-08-05 DIAGNOSIS — D259 Leiomyoma of uterus, unspecified: Secondary | ICD-10-CM | POA: Diagnosis not present

## 2019-08-05 DIAGNOSIS — Z124 Encounter for screening for malignant neoplasm of cervix: Secondary | ICD-10-CM | POA: Diagnosis not present

## 2019-08-05 NOTE — Progress Notes (Signed)
Howard Messner 08-Feb-1966 [id number]  SUBJECTIVE:  54 y.o. [id number] female new patient presenting for an annual routine gynecologic exam and Pap smear. She has a known history of uterie fibroids and has been having increased abdominal pain and upper back pain.  She feels like a 'ton of bricks' in her pelvic and lower midline abdominal area at times.  Sometimes she has pain radiating into her legs.  Sometimes has to take a few minutes to get up due to the pain.  She is chronically constipated.  No vaginal bleeding since her last period occurred in her mid to late 29s.  She did have some spotting last year when she was newly started on asthma pills but has had no recurrence since.  She did stop spotting right after she stopped taking those medications.  Also has been having sciatic lightheadedness and dizziness symptoms and actually had to go to the emergency room recently it sounds like due to this.  It sounds like this was in relation to eating and occurred after having a meal.   Current Outpatient Medications  Medication Sig Dispense Refill  . acetaminophen (TYLENOL) 500 MG tablet Take 500 mg by mouth as needed for moderate pain (arthritis pain).    [name] albuterol (VENTOLIN HFA) 108 (90 Base) MCG/ACT inhaler Inhale 2 puffs into the lungs every 4 (four) hours as needed for wheezing or shortness of breath (cough, shortness of breath or wheezing.). 6.7 g 1  . amLODipine (NORVASC) 10 MG tablet TAKE ONE TABLET BY MOUTH DAILY (Patient taking differently: Take 10 mg by mouth daily. ) 90 tablet 2  . atorvastatin (LIPITOR) 40 MG tablet TAKE ONE TABLET BY MOUTH DAILY (Patient taking differently: Take 40 mg by mouth at bedtime. ) 90 tablet 0  . azelastine (ASTELIN) 0.1 % nasal spray Place 1 spray into both nostrils 2 (two) times daily as needed for allergies.     . Cholecalciferol (VITAMIN D) 125 MCG (5000 UT) CAPS Take 1 tablet by mouth daily.    . cyclobenzaprine (FLEXERIL) 5 MG tablet Take 1 tablet (5  mg total) by mouth 3 (three) times daily as needed. 90 tablet 1  . EPIPEN 2-PAK 0.3 MG/0.3ML SOAJ injection Inject 0.3 mLs (0.3 mg total) into the muscle as needed for anaphylaxis. 1 each 0  . levothyroxine (SYNTHROID) 100 MCG tablet TAKE ONE TABLET BY MOUTH DAILY (Patient taking differently: Take 100 mcg by mouth daily. ) 90 tablet 0  . omeprazole (PRILOSEC) 40 MG capsule Take 1 capsule (40 mg total) by mouth 2 (two) times a day. 60 capsule 11  . ondansetron (ZOFRAN-ODT) 4 MG disintegrating tablet Take 1 tablet (4 mg total) by mouth every 8 (eight) hours as needed for nausea or vomiting. 8 tablet 0  . polyethylene glycol powder (GLYCOLAX/MIRALAX) powder Take 17 g by mouth daily. (Patient taking differently: Take 17 g by mouth daily as needed for mild constipation. ) 500 g 1  . Probiotic Product (PROBIOTIC PO) Take 2 tablets by mouth daily.    . vitamin B-12 (CYANOCOBALAMIN) 500 MCG tablet Take 500 mcg by mouth daily.    . hydrOXYzine (ATARAX/VISTARIL) 50 MG tablet Take 1 tablet (50 mg total) by mouth 3 (three) times daily as needed. (Patient not taking: Reported on 07/30/2019) 30 tablet 0  . meloxicam (MOBIC) 7.5 MG tablet Take 1 tablet (7.5 mg total) by mouth daily. (Patient not taking: Reported on 08/05/2019) 30 tablet 0  . sucralfate (CARAFATE) 1 g tablet Take 1 tablet (1  g total) by mouth 4 (four) times daily -  with meals and at bedtime. 120 tablet 2   No current facility-administered medications for this visit.   Allergies: Promethazine-dm, Shellfish allergy, Tuna [fish allergy], Codeine, and Flonase [fluticasone propionate]  Patient's last menstrual period was 03/18/2014.  Past medical history,surgical history, problem list, medications, allergies, family history and social history were all reviewed and documented as reviewed in the EPIC chart.  ROS:  Feeling well. No dyspnea or chest pain on exertion.  No abdominal pain, change in bowel habits, black or bloody stools.  No urinary tract  symptoms. GYN ROS: no breast pain or new or enlarging lumps on self exam. No neurological complaints.   OBJECTIVE:  BP 130/78   Ht 5\' 3"  (1.6 m)   Wt 197 lb (89.4 kg)   LMP 03/18/2014   BMI 34.90 kg/m  The patient appears well, alert, oriented x 3, in no distress. ENT normal.  Neck supple. No cervical or supraclavicular adenopathy or thyromegaly.  Lungs are clear, good air entry, no wheezes, rhonchi or rales. S1 and S2 normal, no murmurs, regular rate and rhythm.  Abdomen soft without tenderness, guarding, mass or organomegaly.  Neurological is normal, no focal findings.  BREAST EXAM: breasts appear normal, no suspicious masses, no skin or nipple changes or axillary nodes  PELVIC EXAM: VULVA: normal appearing vulva with no masses, tenderness or lesions, VAGINA: normal appearing vagina with normal color and discharge, no lesions, CERVIX: normal appearing cervix without discharge or lesions, UTERUS: Exam limited by body habitus but note a generous sized uterus is slightly enlarged with normal consistency and nontender, ADNEXA: Exam limited by body habitus, but no tenderness nor masses palpated PAP: Pap smear done today, thin-prep method  Chaperone: Caryn Bee present during the examination  ASSESSMENT:  54 y.o. [id number] here for annual gynecologic exam and also with history of fibroid uterus and abdominal pain  PLAN:   1. Postmenopausal.  No significant concerns of vasomotor symptoms.  No active vaginal bleeding. 2. Pap smear 02/2017.  Her Pap smear is repeated today. 3.  Abdominal pain.  Uterus does not feel particularly grossly enlarged on the examination today, but we will check a pelvic ultrasound given her history of uterine fibroids.  Her abdominal pain may be related to a unrelated issue.  She will call back to get the [facility] ultrasound scheduled. 4. Mammogram 06/2019.  Normal breast exam today.  We will continue with annual mammogram next year.   5. Colonoscopy 2012.  Recommended  that she follow up at the recommended interval.   6. DEXA recommended when further into menopause. 7. Health maintenance.  No labs today as she normally has these completed with her primary care provider.  I did point out to her that her hemoglobin A1c was elevated, and as her primary care doctor mentioned to her in a message, this puts her in the "diabetic range."  I advised her to discuss this further with her primary care doctor since her recent symptoms of lightheadedness and dizziness could be consistent with manifestations of diabetes.  She acknowledges understanding and will plan to do this.  Return annually or sooner, prn.  Joseph Pierini MD  08/05/19

## 2019-08-05 NOTE — Patient Instructions (Signed)
Please schedule a pelvic ultrasound at your convenience, please call our office at [phone] to schedule.

## 2019-08-06 LAB — PAP IG W/ RFLX HPV ASCU

## 2019-08-19 ENCOUNTER — Other Ambulatory Visit: Payer: Self-pay

## 2019-08-19 ENCOUNTER — Telehealth: Payer: Self-pay | Admitting: Family Medicine

## 2019-08-19 ENCOUNTER — Encounter: Payer: Self-pay | Admitting: Family Medicine

## 2019-08-19 NOTE — Telephone Encounter (Signed)
error 

## 2019-08-19 NOTE — Telephone Encounter (Signed)
Pt called in and would like for Marilyn [name] to call her regarding the message she sent in earlier this morning.

## 2019-08-20 ENCOUNTER — Telehealth: Payer: Self-pay | Admitting: *Deleted

## 2019-08-20 ENCOUNTER — Ambulatory Visit: Payer: BC Managed Care – PPO | Admitting: Obstetrics and Gynecology

## 2019-08-20 ENCOUNTER — Encounter: Payer: Self-pay | Admitting: Obstetrics and Gynecology

## 2019-08-20 ENCOUNTER — Other Ambulatory Visit: Payer: BC Managed Care – PPO

## 2019-08-20 ENCOUNTER — Telehealth: Payer: Self-pay | Admitting: Family Medicine

## 2019-08-20 ENCOUNTER — Ambulatory Visit
Admission: RE | Admit: 2019-08-20 | Discharge: 2019-08-20 | Disposition: A | Discharge status: Home / Self Care | Payer: BC Managed Care – PPO | Source: Ambulatory Visit | Attending: Obstetrics and Gynecology | Admitting: Obstetrics and Gynecology

## 2019-08-20 ENCOUNTER — Ambulatory Visit (INDEPENDENT_AMBULATORY_CARE_PROVIDER_SITE_OTHER): Payer: BC Managed Care – PPO | Admitting: Obstetrics and Gynecology

## 2019-08-20 ENCOUNTER — Ambulatory Visit: Payer: BC Managed Care – PPO

## 2019-08-20 DIAGNOSIS — R102 Pelvic and perineal pain unspecified side: Secondary | ICD-10-CM

## 2019-08-20 DIAGNOSIS — N854 Malposition of uterus: Secondary | ICD-10-CM | POA: Diagnosis not present

## 2019-08-20 DIAGNOSIS — R918 Other nonspecific abnormal finding of lung field: Secondary | ICD-10-CM

## 2019-08-20 DIAGNOSIS — D259 Leiomyoma of uterus, unspecified: Secondary | ICD-10-CM | POA: Diagnosis not present

## 2019-08-20 DIAGNOSIS — N859 Noninflammatory disorder of uterus, unspecified: Secondary | ICD-10-CM

## 2019-08-20 DIAGNOSIS — R0989 Other specified symptoms and signs involving the circulatory and respiratory systems: Secondary | ICD-10-CM | POA: Diagnosis not present

## 2019-08-20 DIAGNOSIS — R9389 Abnormal findings on diagnostic imaging of other specified body structures: Secondary | ICD-10-CM | POA: Diagnosis not present

## 2019-08-20 NOTE — Telephone Encounter (Signed)
Pt returned [place] call and would like a call back.

## 2019-08-20 NOTE — Telephone Encounter (Signed)
Spoke with pt advised due to having symptoms related to COVID-19 pt would be best if she goes to UC, Pt state that she has an appointment with her GYN today and she will try see if she can be seen for the sinus issues.

## 2019-08-20 NOTE — Telephone Encounter (Signed)
Order placed at [facility] , aware not appointment needed, can walk in between 8am-4:30pm. Patient aware.

## 2019-08-20 NOTE — Progress Notes (Signed)
Marilyn [name]  01-Sep-1965 [id number]  HPI The patient is a 54 y.o. [id number] who presents today for a pelvic ultrasound to follow-up abdominal and pelvic pain discussed at her recent encounter on 08/05/2019.  Please see the encounter note for details.  Pap smear from that encounter was normal.  Since her last visit, she was also having laryngitis and lost her voice, with return.  She not had a fever.  She was tested for COVID-19 earlier in the month and it was negative.  She could not get in with her primary doctor today and they told her to go to urgent care, but since she was coming in for this appointment today she asks if we can listen to her lungs on exam today.  Past medical history,surgical history, problem list, medications, allergies, family history and social history were all reviewed and documented as reviewed in the EPIC chart.  ROS:  Feeling well. + dyspnea, no chest pain on exertion.  No fever. + abdominal pain.  No urinary tract symptoms.   Physical Exam  LMP 03/18/2014  General: Pleasant female, no acute distress, alert and oriented Pulmonary: Lower lung fields indicate resonance and egophony especially in the right lower lung, upper lung fields are clear bilaterally.  Good air movement and no exertion with breathing.  No wheezes, rhonchi, or rales. PELVIC EXAM: VULVA: normal appearing vulva with no masses, tenderness or lesions, VAGINA: normal appearing vagina with normal color and discharge, no lesions, CERVIX: normal appearing cervix fairly flush with vagina without discharge or lesions.  Endometrial biopsy procedure note The cervix was cleansed with a Betadine swab.  The anterior lip of the cervix was grasped with an Allis clamp.  Internal cervical os was easily dilated with the os finder.  The endometrial Pipelle sampler was easily introduced into the endometrial cavity and sound length measurement was taken of 8 cm.  The plunger was withdrawn causing suction in the  Pipelle and the device was moved about the cavity for about 10 sec.  The Pipelle was removed then the sample was emptied in a specimen cup, maintaining sterility.  The Pipelle was reintroduced into the endometrial cavity to collect any remaining sample.  The specimen contents were collected and sent to pathology for analysis.  The patient tolerated the procedure with moderate discomfort but without any complication.  Johnsie Kindred, RDMS, present for exam and procedure.   Pelvic ultrasound Anteverted and enlarged uterus 9.8 x 8.5 x 7.0 cm 3 large intramural and submucosal fibroids, the largest is 6.2 x 5.0 cm Endometrial cavity with distortion by the fibroids Endometrial thickness 6.3 mm, debris-filled fluid swirling within endometrial cavity No definitive endometrial masses Bilateral ovaries small and atrophic, left ovary with an 11 mm simple cystic lesion.  Normal blood flow to ovaries. No adnexal masses.  No free fluid.    Assessment 54 yo [phone] with abdominal and pelvic pain, enlarged fibroid uterus, endometrial thickening with debris laden fluid in endometrial cavity, upper respiratory symptoms with abnormal lung exam  Plan 1.  Endometrial thickening.  Ultrasound findings are discussed with the patient today and she is shown the images.  Endometrial biopsy was obtained today.  She was asymptomatic without any vaginal bleeding, except last year when she had a brief episode.  Also, given the abnormal appearance of debris laden fluid in the cavity, the decision was made to obtain a biopsy.  The patient did okay with this but had moderate discomfort.  We will let her know about the  results when we get those back. 2.  Fibroid uterus.  She does have a large fibroid and 2 smaller fibroids visible on the ultrasound exam today.  It is possible that this could be causing her some of her abdominal and pelvic pain.  She is also noted to be constipated.  We will have her back for further discussion  after the endometrial biopsy results have returned. 3.  Upper respiratory symptoms and abnormal lung exam.  Chest x-ray is ordered to evaluate the right lung field.  COVID-19 testing should be considered based upon protocols before getting the imaging.  I will have staff call her to coordinate the x-ray, and we will also send a copy to her primary care doctor to follow-up any findings that may be noted on the x-ray.   Joseph Pierini MD, FACOG 08/20/19

## 2019-08-20 NOTE — Telephone Encounter (Signed)
Pt had a pelvic  US today with her OBGYN office, pt had called our office earlier for appointment for a bad chest congestion but there was no availability, pt stated that she would have her GYN examine her, pt was evaluated and sent to [facility] imaging for a chest x-ray , pt states that once the radiologist reads the x-ray they should send the report to our office for Dr Volanda Napoleon to review and give pt recommendations, Advised pt that our office will call her with the x-ray results/ Dr Volanda Napoleon recommendations.

## 2019-08-20 NOTE — Telephone Encounter (Signed)
-----   Message from Ramond Craver, [place] sent at 08/20/2019  2:03 PM EDT ----- Regarding: RE: chest x ray  ----- Message ----- From: Joseph Pierini, MD Sent: 08/20/2019   1:20 PM EDT To: [facility] Clinical Pool Subject: chest x ray                                    Patient in for a pelvic ultrasound today but having respiratory symptoms. Lung exam abnormal with possibly some fluid on lungs. I ordered a chest x ray for her so do we help her coordinate that? If we do, can we please make sure her primary care doctor gets notified of the chest x ray result, too, so she can be treated by her if there are any abnormal findings? Thank you.

## 2019-08-21 LAB — PATHOLOGY REPORT

## 2019-08-21 LAB — TISSUE SPECIMEN

## 2019-08-21 NOTE — Telephone Encounter (Signed)
Reviewed Pt x-ray and  verbalized understanding, Pt states that she is very congested, coughing up green, brown phlegm,running nose and pressure in both ears, pt sate she has tried mucinex and OTC decongestant and nothing is helping. Pt request for z-pac or antibiotic to help with this, Please advise

## 2019-08-21 NOTE — Telephone Encounter (Signed)
CXR normal.

## 2019-08-24 NOTE — Telephone Encounter (Signed)
Patient has x-ray done on 08/20/19

## 2019-08-24 NOTE — Telephone Encounter (Signed)
Called left a message for pt regarding scheduling appointment for further evaluation

## 2019-08-24 NOTE — Telephone Encounter (Signed)
Pt should make an appt for evaluation.

## 2019-08-25 NOTE — Telephone Encounter (Signed)
No action needed

## 2019-08-28 NOTE — Telephone Encounter (Signed)
Pt chart state that pt was seen at he urgent are, attempted to call pt but no answer, left a voice message for pt to call the office

## 2019-09-30 ENCOUNTER — Other Ambulatory Visit: Payer: Self-pay | Admitting: Family Medicine

## 2019-09-30 DIAGNOSIS — E890 Postprocedural hypothyroidism: Secondary | ICD-10-CM

## 2019-10-10 ENCOUNTER — Other Ambulatory Visit: Payer: Self-pay

## 2019-10-10 ENCOUNTER — Emergency Department (HOSPITAL_COMMUNITY)
Admission: EM | Admit: 2019-10-10 | Discharge: 2019-10-10 | Disposition: A | Discharge status: Home / Self Care | Payer: BC Managed Care – PPO | Attending: Emergency Medicine | Admitting: Emergency Medicine

## 2019-10-10 ENCOUNTER — Encounter (HOSPITAL_COMMUNITY): Payer: Self-pay | Admitting: Emergency Medicine

## 2019-10-10 DIAGNOSIS — I10 Essential (primary) hypertension: Secondary | ICD-10-CM | POA: Insufficient documentation

## 2019-10-10 DIAGNOSIS — Y92481 Parking lot as the place of occurrence of the external cause: Secondary | ICD-10-CM | POA: Diagnosis not present

## 2019-10-10 DIAGNOSIS — M79632 Pain in left forearm: Secondary | ICD-10-CM | POA: Insufficient documentation

## 2019-10-10 DIAGNOSIS — Z87891 Personal history of nicotine dependence: Secondary | ICD-10-CM | POA: Insufficient documentation

## 2019-10-10 DIAGNOSIS — M545 Low back pain: Secondary | ICD-10-CM | POA: Diagnosis not present

## 2019-10-10 DIAGNOSIS — M25512 Pain in left shoulder: Secondary | ICD-10-CM | POA: Insufficient documentation

## 2019-10-10 DIAGNOSIS — Y999 Unspecified external cause status: Secondary | ICD-10-CM | POA: Diagnosis not present

## 2019-10-10 DIAGNOSIS — E039 Hypothyroidism, unspecified: Secondary | ICD-10-CM | POA: Insufficient documentation

## 2019-10-10 DIAGNOSIS — M542 Cervicalgia: Secondary | ICD-10-CM | POA: Insufficient documentation

## 2019-10-10 DIAGNOSIS — J45909 Unspecified asthma, uncomplicated: Secondary | ICD-10-CM | POA: Diagnosis not present

## 2019-10-10 DIAGNOSIS — M7918 Myalgia, other site: Secondary | ICD-10-CM

## 2019-10-10 DIAGNOSIS — Y939 Activity, unspecified: Secondary | ICD-10-CM | POA: Diagnosis not present

## 2019-10-10 DIAGNOSIS — Z79899 Other long term (current) drug therapy: Secondary | ICD-10-CM | POA: Insufficient documentation

## 2019-10-10 DIAGNOSIS — E119 Type 2 diabetes mellitus without complications: Secondary | ICD-10-CM | POA: Insufficient documentation

## 2019-10-10 MED ORDER — MELOXICAM 7.5 MG PO TABS: 7.5000 mg | ORAL_TABLET | Freq: Every day | ORAL | 0 refills | Status: DC

## 2019-10-10 MED ORDER — CYCLOBENZAPRINE HCL 10 MG PO TABS
10.0000 mg | ORAL_TABLET | Freq: Once | ORAL | Status: AC
  Administered 2019-10-10: 10 mg via ORAL
  Filled 2019-10-10: qty 1

## 2019-10-10 MED ORDER — METHOCARBAMOL 500 MG PO TABS: 500.0000 mg | ORAL_TABLET | Freq: Two times a day (BID) | ORAL | 0 refills | Status: DC

## 2019-10-10 MED ORDER — KETOROLAC TROMETHAMINE 30 MG/ML IJ SOLN
30.0000 mg | Freq: Once | INTRAMUSCULAR | Status: AC
  Administered 2019-10-10: 30 mg via INTRAMUSCULAR
  Filled 2019-10-10: qty 1

## 2019-10-10 MED ORDER — LIDOCAINE 5 % EX PTCH
1.0000 | MEDICATED_PATCH | CUTANEOUS | Status: DC
  Administered 2019-10-10: 1 via TRANSDERMAL
  Filled 2019-10-10: qty 1

## 2019-10-10 NOTE — Discharge Instructions (Signed)
Apply warm compresses to sore muscles for 20 minutes at least three times daily followed by gentle range of motion stretching. Take Meloxicam and Robaxin as needed as prescribed. Follow up with your doctor if not improving.
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
## 2019-10-27 ENCOUNTER — Telehealth: Payer: Self-pay | Admitting: *Deleted

## 2019-10-27 NOTE — Telephone Encounter (Signed)
Noted.  Thank you.  I think urology follow-up is appropriate.  If any vaginal symptoms, pelvic pain, vaginal bleeding, she should see [place].
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
## 2019-10-28 ENCOUNTER — Other Ambulatory Visit: Payer: Self-pay

## 2019-10-29 ENCOUNTER — Telehealth: Payer: Self-pay | Admitting: Family Medicine

## 2019-10-29 ENCOUNTER — Ambulatory Visit (INDEPENDENT_AMBULATORY_CARE_PROVIDER_SITE_OTHER): Payer: BC Managed Care – PPO | Admitting: Family Medicine

## 2019-10-29 ENCOUNTER — Encounter: Payer: Self-pay | Admitting: Family Medicine

## 2019-10-29 VITALS — BP 124/78 | HR 97 | Temp 98.0°F | Wt 205.0 lb

## 2019-10-29 DIAGNOSIS — N200 Calculus of kidney: Secondary | ICD-10-CM

## 2019-10-29 DIAGNOSIS — E1169 Type 2 diabetes mellitus with other specified complication: Secondary | ICD-10-CM | POA: Diagnosis not present

## 2019-10-29 LAB — POCT URINALYSIS DIPSTICK
Bilirubin, UA: NEGATIVE
Blood, UA: NEGATIVE
Glucose, UA: NEGATIVE
Ketones, UA: NEGATIVE
Leukocytes, UA: NEGATIVE
Nitrite, UA: NEGATIVE
Protein, UA: NEGATIVE
Spec Grav, UA: 1.025 (ref 1.010–1.025)
Urobilinogen, UA: 0.2 E.U./dL
pH, UA: 5.5 (ref 5.0–8.0)

## 2019-10-29 LAB — MICROALBUMIN / CREATININE URINE RATIO
Creatinine,U: 193.9 mg/dL
Microalb Creat Ratio: 0.5 mg/g (ref 0.0–30.0)
Microalb, Ur: 1 mg/dL (ref 0.0–1.9)

## 2019-10-29 LAB — GLUCOSE, POCT (MANUAL RESULT ENTRY): POC Glucose: 95 mg/dl (ref 70–99)
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
## 2019-10-29 NOTE — Telephone Encounter (Signed)
Spoke with pt advised to call her pharmacy and check on which blood Glucose meter is covered by her insurance and have the pharmacy fax the request to the office

## 2019-10-29 NOTE — Patient Instructions (Signed)
Diabetes Basics  Diabetes (diabetes mellitus) is a long-term (chronic) disease. It occurs when the body does not properly use sugar (glucose) that is released from food after you eat. Diabetes may be caused by one or both of these problems:  Your pancreas does not make enough of a hormone called insulin.  Your body does not react in a normal way to insulin that it makes. Insulin lets sugars (glucose) go into cells in your body. This gives you energy. If you have diabetes, sugars cannot get into cells. This causes high blood sugar (hyperglycemia). Follow these instructions at home: How is diabetes treated? You may need to take insulin or other diabetes medicines daily to keep your blood sugar in balance. Take your diabetes medicines every day as told by your doctor. List your diabetes medicines here: Diabetes medicines  Name of medicine: ______________________________ ? Amount (dose): _______________ Time (a.m./p.m.): _______________ Notes: ___________________________________  Name of medicine: ______________________________ ? Amount (dose): _______________ Time (a.m./p.m.): _______________ Notes: ___________________________________  Name of medicine: ______________________________ ? Amount (dose): _______________ Time (a.m./p.m.): _______________ Notes: ___________________________________ If you use insulin, you will learn how to give yourself insulin by injection. You may need to adjust the amount based on the food that you eat. List the types of insulin you use here: Insulin  Insulin type: ______________________________ ? Amount (dose): _______________ Time (a.m./p.m.): _______________ Notes: ___________________________________  Insulin type: ______________________________ ? Amount (dose): _______________ Time (a.m./p.m.): _______________ Notes: ___________________________________  Insulin type: ______________________________ ? Amount (dose): _______________ Time (a.m./p.m.):  _______________ Notes: ___________________________________  Insulin type: ______________________________ ? Amount (dose): _______________ Time (a.m./p.m.): _______________ Notes: ___________________________________  Insulin type: ______________________________ ? Amount (dose): _______________ Time (a.m./p.m.): _______________ Notes: ___________________________________ How do I manage my blood sugar?  Check your blood sugar levels using a blood glucose monitor as directed by your doctor. Your doctor will set treatment goals for you. Generally, you should have these blood sugar levels:  Before meals (preprandial): 80-130 mg/dL (4.4-7.2 mmol/L).  After meals (postprandial): below 180 mg/dL (10 mmol/L).  A1c level: less than 7%. Write down the times that you will check your blood sugar levels: Blood sugar checks  Time: _______________ Notes: ___________________________________  Time: _______________ Notes: ___________________________________  Time: _______________ Notes: ___________________________________  Time: _______________ Notes: ___________________________________  Time: _______________ Notes: ___________________________________  Time: _______________ Notes: ___________________________________  What do I need to know about low blood sugar? Low blood sugar is called hypoglycemia. This is when blood sugar is at or below 70 mg/dL (3.9 mmol/L). Symptoms may include:  Feeling: ? Hungry. ? Worried or nervous (anxious). ? Sweaty and clammy. ? Confused. ? Dizzy. ? Sleepy. ? Sick to your stomach (nauseous).  Having: ? A fast heartbeat. ? A headache. ? A change in your vision. ? Tingling or no feeling (numbness) around the mouth, lips, or tongue. ? Jerky movements that you cannot control (seizure).  Having trouble with: ? Moving (coordination). ? Sleeping. ? Passing out (fainting). ? Getting upset easily (irritability). Treating low blood sugar To treat low blood  sugar, eat or drink something sugary right away. If you can think clearly and swallow safely, follow the 15:15 rule:  Take 15 grams of a fast-acting carb (carbohydrate). Talk with your doctor about how much you should take.  Some fast-acting carbs are: ? Sugar tablets (glucose pills). Take 3-4 glucose pills. ? 6-8 pieces of hard candy. ? 4-6 oz (120-150 mL) of fruit juice. ? 4-6 oz (120-150 mL) of regular (not diet) soda. ? 1 Tbsp (15 mL) honey or sugar.    Check your blood sugar 15 minutes after you take the carb.  If your blood sugar is still at or below 70 mg/dL (3.9 mmol/L), take 15 grams of a carb again.  If your blood sugar does not go above 70 mg/dL (3.9 mmol/L) after 3 tries, get help right away.  After your blood sugar goes back to normal, eat a meal or a snack within 1 hour. Treating very low blood sugar If your blood sugar is at or below 54 mg/dL (3 mmol/L), you have very low blood sugar (severe hypoglycemia). This is an emergency. Do not wait to see if the symptoms will go away. Get medical help right away. Call your local emergency services (911 in the [place] Do not drive yourself to the hospital. Questions to ask your health care provider  Do I need to meet with a diabetes educator?  What equipment will I need to care for myself at home?  What diabetes medicines do I need? When should I take them?  How often do I need to check my blood sugar?  What number can I call if I have questions?  When is my next doctor's visit?  Where can I find a support group for people with diabetes? Where to find more information  American Diabetes Association: www.diabetes.org  American Association of Diabetes Educators: www.diabeteseducator.org/patient-resources Contact a doctor if:  Your blood sugar is at or above 240 mg/dL (13.3 mmol/L) for 2 days in a row.  You have been sick or have had a fever for 2 days or more, and you are not getting better.  You have any of these  problems for more than 6 hours: ? You cannot eat or drink. ? You feel sick to your stomach (nauseous). ? You throw up (vomit). ? You have watery poop (diarrhea). Get help right away if:  Your blood sugar is lower than 54 mg/dL (3 mmol/L).  You get confused.  You have trouble: ? Thinking clearly. ? Breathing. Summary  Diabetes (diabetes mellitus) is a long-term (chronic) disease. It occurs when the body does not properly use sugar (glucose) that is released from food after digestion.  Take insulin and diabetes medicines as told.  Check your blood sugar every day, as often as told.  Keep all follow-up visits as told by your doctor. This is important. This information is not intended to replace advice given to you by your health care provider. Make sure you discuss any questions you have with your health care provider. Document Revised: 02/11/2019 Document Reviewed: 08/23/2017 Elsevier Patient Education  [place].  Diabetes Mellitus and Nutrition, Adult When you have diabetes (diabetes mellitus), it is very important to have healthy eating habits because your blood sugar (glucose) levels are greatly affected by what you eat and drink. Eating healthy foods in the appropriate amounts, at about the same times every day, can help you:  Control your blood glucose.  Lower your risk of heart disease.  Improve your blood pressure.  Reach or maintain a healthy weight. Every person with diabetes is different, and each person has different needs for a meal plan. Your health care provider may recommend that you work with a diet and nutrition specialist (dietitian) to make a meal plan that is best for you. Your meal plan may vary depending on factors such as:  The calories you need.  The medicines you take.  Your weight.  Your blood glucose, blood pressure, and cholesterol levels.  Your activity level.  Other health conditions  you have, such as heart or kidney disease. How  do carbohydrates affect me? Carbohydrates, also called carbs, affect your blood glucose level more than any other type of food. Eating carbs naturally raises the amount of glucose in your blood. Carb counting is a method for keeping track of how many carbs you eat. Counting carbs is important to keep your blood glucose at a healthy level, especially if you use insulin or take certain oral diabetes medicines. It is important to know how many carbs you can safely have in each meal. This is different for every person. Your dietitian can help you calculate how many carbs you should have at each meal and for each snack. Foods that contain carbs include:  Bread, cereal, rice, pasta, and crackers.  Potatoes and corn.  Peas, beans, and lentils.  Milk and yogurt.  Fruit and juice.  Desserts, such as cakes, cookies, ice cream, and candy. How does alcohol affect me? Alcohol can cause a sudden decrease in blood glucose (hypoglycemia), especially if you use insulin or take certain oral diabetes medicines. Hypoglycemia can be a life-threatening condition. Symptoms of hypoglycemia (sleepiness, dizziness, and confusion) are similar to symptoms of having too much alcohol. If your health care provider says that alcohol is safe for you, follow these guidelines:  Limit alcohol intake to no more than 1 drink per day for nonpregnant women and 2 drinks per day for men. One drink equals 12 oz of beer, 5 oz of wine, or 1 oz of hard liquor.  Do not drink on an empty stomach.  Keep yourself hydrated with water, diet soda, or unsweetened iced tea.  Keep in mind that regular soda, juice, and other mixers may contain a lot of sugar and must be counted as carbs. What are tips for following this plan?  Reading food labels  Start by checking the serving size on the "Nutrition Facts" label of packaged foods and drinks. The amount of calories, carbs, fats, and other nutrients listed on the label is based on one serving  of the item. Many items contain more than one serving per package.  Check the total grams (g) of carbs in one serving. You can calculate the number of servings of carbs in one serving by dividing the total carbs by 15. For example, if a food has 30 g of total carbs, it would be equal to 2 servings of carbs.  Check the number of grams (g) of saturated and trans fats in one serving. Choose foods that have low or no amount of these fats.  Check the number of milligrams (mg) of salt (sodium) in one serving. Most people should limit total sodium intake to less than 2,300 mg per day.  Always check the nutrition information of foods labeled as "low-fat" or "nonfat". These foods may be higher in added sugar or refined carbs and should be avoided.  Talk to your dietitian to identify your daily goals for nutrients listed on the label. Shopping  Avoid buying canned, premade, or processed foods. These foods tend to be high in fat, sodium, and added sugar.  Shop around the outside edge of the grocery store. This includes fresh fruits and vegetables, bulk grains, fresh meats, and fresh dairy. Cooking  Use low-heat cooking methods, such as baking, instead of high-heat cooking methods like deep frying.  Cook using healthy oils, such as olive, canola, or sunflower oil.  Avoid cooking with butter, cream, or high-fat meats. Meal planning  Eat meals and snacks regularly, preferably  at the same times every day. Avoid going long periods of time without eating.  Eat foods high in fiber, such as fresh fruits, vegetables, beans, and whole grains. Talk to your dietitian about how many servings of carbs you can eat at each meal.  Eat 4-6 ounces (oz) of lean protein each day, such as lean meat, chicken, fish, eggs, or tofu. One oz of lean protein is equal to: ? 1 oz of meat, chicken, or fish. ? 1 egg. ?  cup of tofu.  Eat some foods each day that contain healthy fats, such as avocado, nuts, seeds, and  fish. Lifestyle  Check your blood glucose regularly.  Exercise regularly as told by your health care provider. This may include: ? 150 minutes of moderate-intensity or vigorous-intensity exercise each week. This could be brisk walking, biking, or water aerobics. ? Stretching and doing strength exercises, such as yoga or weightlifting, at least 2 times a week.  Take medicines as told by your health care provider.  Do not use any products that contain nicotine or tobacco, such as cigarettes and e-cigarettes. If you need help quitting, ask your health care provider.  Work with a Social worker or diabetes educator to identify strategies to manage stress and any emotional and social challenges. Questions to ask a health care provider  Do I need to meet with a diabetes educator?  Do I need to meet with a dietitian?  What number can I call if I have questions?  When are the best times to check my blood glucose? Where to find more information:  American Diabetes Association: diabetes.org  Academy of Nutrition and Dietetics: www.eatright.[employer] of Diabetes and Digestive and Kidney Diseases (NIH): [employer] Summary  A healthy meal plan will help you control your blood glucose and maintain a healthy lifestyle.  Working with a diet and nutrition specialist (dietitian) can help you make a meal plan that is best for you.  Keep in mind that carbohydrates (carbs) and alcohol have immediate effects on your blood glucose levels. It is important to count carbs and to use alcohol carefully. This information is not intended to replace advice given to you by your health care provider. Make sure you discuss any questions you have with your health care provider. Document Revised: 05/03/2017 Document Reviewed: 06/25/2016 Elsevier Patient Education  2020 [place].  Kidney Stones  Kidney stones are solid, rock-like deposits that form inside of the kidneys. The kidneys are a  pair of organs that make urine. A kidney stone may form in a kidney and move into other parts of the urinary tract, including the tubes that connect the kidneys to the bladder (ureters), the bladder, and the tube that carries urine out of the body (urethra). As the stone moves through these areas, it can cause intense pain and block the flow of urine. Kidney stones are created when high levels of certain minerals are found in the urine. The stones are usually passed out of the body through urination, but in some cases, medical treatment may be needed to remove them. What are the causes? Kidney stones may be caused by:  A condition in which certain glands produce too much parathyroid hormone (primary hyperparathyroidism), which causes too much calcium buildup in the blood.  A buildup of uric acid crystals in the bladder (hyperuricosuria). Uric acid is a chemical that the body produces when you eat certain foods. It usually exits the body in the urine.  Narrowing (stricture) of one or both  of the ureters.  A kidney blockage that is present at birth (congenital obstruction).  Past surgery on the kidney or the ureters, such as gastric bypass surgery. What increases the risk? The following factors may make you more likely to develop this condition:  Having had a kidney stone in the past.  Having a family history of kidney stones.  Not drinking enough water.  Eating a diet that is high in protein, salt (sodium), or sugar.  Being overweight or obese. What are the signs or symptoms? Symptoms of a kidney stone may include:  Pain in the side of the abdomen, right below the ribs (flank pain). Pain usually spreads (radiates) to the groin.  Needing to urinate frequently or urgently.  Painful urination.  Blood in the urine (hematuria).  Nausea.  Vomiting.  Fever and chills. How is this diagnosed? This condition may be diagnosed based on:  Your symptoms and medical history.  A  physical exam.  Blood tests.  Urine tests. These may be done before and after the stone passes out of your body through urination.  Imaging tests, such as a CT scan, abdominal X-ray, or ultrasound.  A procedure to examine the inside of the bladder (cystoscopy). How is this treated? Treatment for kidney stones depends on the size, location, and makeup of the stones. Kidney stones will often pass out of the body through urination. You may need to:  Increase your fluid intake to help pass the stone. In some cases, you may be given fluids through an IV and may need to be monitored at the hospital.  Take medicine for pain.  Make changes in your diet to help prevent kidney stones from coming back. Sometimes, medical procedures are needed to remove a kidney stone. This may involve:  A procedure to break up kidney stones using: ? A focused beam of light (laser therapy). ? Shock waves (extracorporeal shock wave lithotripsy).  Surgery to remove kidney stones. This may be needed if you have severe pain or have stones that block your urinary tract. Follow these instructions at home: Medicines  Take over-the-counter and prescription medicines only as told by your health care provider.  Ask your health care provider if the medicine prescribed to you requires you to avoid driving or using heavy machinery. Eating and drinking  Drink enough fluid to keep your urine pale yellow. You may be instructed to drink at least 8-10 glasses of water each day. This will help you pass the kidney stone.  If directed, change your diet. This may include: ? Limiting how much sodium you eat. ? Eating more fruits and vegetables. ? Limiting how much animal protein--such as red meat, poultry, fish, and eggs--you eat.  Follow instructions from your health care provider about eating or drinking restrictions. General instructions  Collect urine samples as told by your health care provider. You may need to collect a  urine sample: ? 24 hours after you pass the stone. ? 8-12 weeks after passing the kidney stone, and every 6-12 months after that.  Strain your urine every time you urinate, for as long as directed. Use the strainer that your health care provider recommends.  Do not throw out the kidney stone after passing it. Keep the stone so it can be tested by your health care provider. Testing the makeup of your kidney stone may help prevent you from getting kidney stones in the future.  Keep all follow-up visits as told by your health care provider. This is important. You  may need follow-up X-rays or ultrasounds to make sure that your stone has passed. How is this prevented? To prevent another kidney stone:  Drink enough fluid to keep your urine pale yellow. This is the best way to prevent kidney stones.  Eat a healthy diet and follow recommendations from your health care provider about foods to avoid. You may be instructed to eat a low-protein diet. Recommendations vary depending on the type of kidney stone that you have.  Maintain a healthy weight. Where to find more information  [place] [facility]): www.kidney.[facility]): www.urologyhealth.org Contact a health care provider if:  You have pain that gets worse or does not get better with medicine. Get help right away if:  You have a fever or chills.  You develop severe pain.  You develop new abdominal pain.  You faint.  You are unable to urinate. Summary  Kidney stones are solid, rock-like deposits that form inside of the kidneys.  Kidney stones can cause nausea, vomiting, blood in the urine, abdominal pain, and the urge to urinate frequently.  Treatment for kidney stones depends on the size, location, and makeup of the stones. Kidney stones will often pass out of the body through urination.  Kidney stones can be prevented by drinking enough fluids, eating a healthy diet, and maintaining a healthy  weight. This information is not intended to replace advice given to you by your health care provider. Make sure you discuss any questions you have with your health care provider. Document Revised: 10/07/2018 Document Reviewed: 10/07/2018 Elsevier Patient Education  [place].

## 2019-10-29 NOTE — Telephone Encounter (Signed)
The patient called to let Izora Gala know that [employer] said that she can get the One Touch free   She just needs you to send in 3 different Rx for the meter, the lancets, and the strips.

## 2019-10-29 NOTE — Telephone Encounter (Signed)
The patient just left the office and called the office to have Dr. Volanda Napoleon nurse to call her

## 2019-10-30 ENCOUNTER — Other Ambulatory Visit: Payer: Self-pay

## 2019-10-30 ENCOUNTER — Telehealth: Payer: Self-pay | Admitting: Family Medicine

## 2019-10-30 MED ORDER — ONETOUCH ULTRASOFT LANCETS MISC: 0 refills | Status: DC

## 2019-10-30 MED ORDER — ONETOUCH ULTRA VI STRP: ORAL_STRIP | 0 refills | Status: DC

## 2019-10-30 MED ORDER — ONETOUCH VERIO SYNC SYSTEM W/DEVICE KIT: 1.0000 | PACK | Freq: Every day | 0 refills | Status: DC | PRN

## 2019-10-30 NOTE — Telephone Encounter (Signed)
Spoke with pt aware that Rx was sent to her pharmacy

## 2019-10-30 NOTE — Telephone Encounter (Signed)
  Marilyn [name] is returning your call   [phone]

## 2019-10-30 NOTE — Telephone Encounter (Signed)
Spoke with pt state that she received a coupon from her insurance for a glucose meter but needs refill on lancets and strips, pt was advised to have her pharmacy fax or call our office with the name or brand of the meter so our office can sent th Rx

## 2019-11-02 ENCOUNTER — Encounter: Payer: Self-pay | Admitting: Family Medicine

## 2019-11-03 ENCOUNTER — Other Ambulatory Visit: Payer: Self-pay

## 2019-11-03 MED ORDER — ONETOUCH DELICA LANCETS 33G MISC: 1 refills | Status: DC

## 2019-11-03 MED ORDER — ONETOUCH VERIO FLEX SYSTEM W/DEVICE KIT: PACK | 0 refills | Status: DC

## 2019-11-03 MED ORDER — ONETOUCH VERIO VI STRP: ORAL_STRIP | 1 refills | Status: DC

## 2019-11-27 ENCOUNTER — Other Ambulatory Visit: Payer: Self-pay

## 2019-11-30 ENCOUNTER — Ambulatory Visit: Payer: BC Managed Care – PPO | Admitting: Family Medicine

## 2019-11-30 ENCOUNTER — Other Ambulatory Visit: Payer: Self-pay

## 2019-11-30 ENCOUNTER — Encounter: Payer: Self-pay | Admitting: Family Medicine

## 2019-11-30 VITALS — BP 110/74 | HR 96 | Temp 97.8°F | Wt 195.0 lb

## 2019-11-30 DIAGNOSIS — E782 Mixed hyperlipidemia: Secondary | ICD-10-CM

## 2019-11-30 DIAGNOSIS — J302 Other seasonal allergic rhinitis: Secondary | ICD-10-CM

## 2019-11-30 DIAGNOSIS — E039 Hypothyroidism, unspecified: Secondary | ICD-10-CM | POA: Diagnosis not present

## 2019-11-30 DIAGNOSIS — E119 Type 2 diabetes mellitus without complications: Secondary | ICD-10-CM

## 2019-11-30 MED ORDER — ONETOUCH VERIO VI STRP: ORAL_STRIP | 3 refills | Status: DC

## 2019-11-30 MED ORDER — FEXOFENADINE HCL 180 MG PO TABS: 180.0000 mg | ORAL_TABLET | Freq: Every day | ORAL | 3 refills | Status: DC

## 2019-11-30 NOTE — Patient Instructions (Addendum)
You can use saline nasal rinse daily to help with your allergy symptoms.  It can be found over-the-counter at your local drugstore. Diabetes Mellitus and Nutrition, Adult When you have diabetes (diabetes mellitus), it is very important to have healthy eating habits because your blood sugar (glucose) levels are greatly affected by what you eat and drink. Eating healthy foods in the appropriate amounts, at about the same times every day, can help you:  Control your blood glucose.  Lower your risk of heart disease.  Improve your blood pressure.  Reach or maintain a healthy weight. Every person with diabetes is different, and each person has different needs for a meal plan. Your health care provider may recommend that you work with a diet and nutrition specialist (dietitian) to make a meal plan that is best for you. Your meal plan may vary depending on factors such as:  The calories you need.  The medicines you take.  Your weight.  Your blood glucose, blood pressure, and cholesterol levels.  Your activity level.  Other health conditions you have, such as heart or kidney disease. How do carbohydrates affect me? Carbohydrates, also called carbs, affect your blood glucose level more than any other type of food. Eating carbs naturally raises the amount of glucose in your blood. Carb counting is a method for keeping track of how many carbs you eat. Counting carbs is important to keep your blood glucose at a healthy level, especially if you use insulin or take certain oral diabetes medicines. It is important to know how many carbs you can safely have in each meal. This is different for every person. Your dietitian can help you calculate how many carbs you should have at each meal and for each snack. Foods that contain carbs include:  Bread, cereal, rice, pasta, and crackers.  Potatoes and corn.  Peas, beans, and lentils.  Milk and yogurt.  Fruit and juice.  Desserts, such as cakes,  cookies, ice cream, and candy. How does alcohol affect me? Alcohol can cause a sudden decrease in blood glucose (hypoglycemia), especially if you use insulin or take certain oral diabetes medicines. Hypoglycemia can be a life-threatening condition. Symptoms of hypoglycemia (sleepiness, dizziness, and confusion) are similar to symptoms of having too much alcohol. If your health care provider says that alcohol is safe for you, follow these guidelines:  Limit alcohol intake to no more than 1 drink per day for nonpregnant women and 2 drinks per day for men. One drink equals 12 oz of beer, 5 oz of wine, or 1 oz of hard liquor.  Do not drink on an empty stomach.  Keep yourself hydrated with water, diet soda, or unsweetened iced tea.  Keep in mind that regular soda, juice, and other mixers may contain a lot of sugar and must be counted as carbs. What are tips for following this plan?  Reading food labels  Start by checking the serving size on the "Nutrition Facts" label of packaged foods and drinks. The amount of calories, carbs, fats, and other nutrients listed on the label is based on one serving of the item. Many items contain more than one serving per package.  Check the total grams (g) of carbs in one serving. You can calculate the number of servings of carbs in one serving by dividing the total carbs by 15. For example, if a food has 30 g of total carbs, it would be equal to 2 servings of carbs.  Check the number of grams (g) of  saturated and trans fats in one serving. Choose foods that have low or no amount of these fats.  Check the number of milligrams (mg) of salt (sodium) in one serving. Most people should limit total sodium intake to less than 2,300 mg per day.  Always check the nutrition information of foods labeled as "low-fat" or "nonfat". These foods may be higher in added sugar or refined carbs and should be avoided.  Talk to your dietitian to identify your daily goals for  nutrients listed on the label. Shopping  Avoid buying canned, premade, or processed foods. These foods tend to be high in fat, sodium, and added sugar.  Shop around the outside edge of the grocery store. This includes fresh fruits and vegetables, bulk grains, fresh meats, and fresh dairy. Cooking  Use low-heat cooking methods, such as baking, instead of high-heat cooking methods like deep frying.  Cook using healthy oils, such as olive, canola, or sunflower oil.  Avoid cooking with butter, cream, or high-fat meats. Meal planning  Eat meals and snacks regularly, preferably at the same times every day. Avoid going long periods of time without eating.  Eat foods high in fiber, such as fresh fruits, vegetables, beans, and whole grains. Talk to your dietitian about how many servings of carbs you can eat at each meal.  Eat 4-6 ounces (oz) of lean protein each day, such as lean meat, chicken, fish, eggs, or tofu. One oz of lean protein is equal to: ? 1 oz of meat, chicken, or fish. ? 1 egg. ?  cup of tofu.  Eat some foods each day that contain healthy fats, such as avocado, nuts, seeds, and fish. Lifestyle  Check your blood glucose regularly.  Exercise regularly as told by your health care provider. This may include: ? 150 minutes of moderate-intensity or vigorous-intensity exercise each week. This could be brisk walking, biking, or water aerobics. ? Stretching and doing strength exercises, such as yoga or weightlifting, at least 2 times a week.  Take medicines as told by your health care provider.  Do not use any products that contain nicotine or tobacco, such as cigarettes and e-cigarettes. If you need help quitting, ask your health care provider.  Work with a Social worker or diabetes educator to identify strategies to manage stress and any emotional and social challenges. Questions to ask a health care provider  Do I need to meet with a diabetes educator?  Do I need to meet with a  dietitian?  What number can I call if I have questions?  When are the best times to check my blood glucose? Where to find more information:  American Diabetes Association: diabetes.org  Academy of Nutrition and Dietetics: www.eatright.[employer] of Diabetes and Digestive and Kidney Diseases (NIH): [employer] Summary  A healthy meal plan will help you control your blood glucose and maintain a healthy lifestyle.  Working with a diet and nutrition specialist (dietitian) can help you make a meal plan that is best for you.  Keep in mind that carbohydrates (carbs) and alcohol have immediate effects on your blood glucose levels. It is important to count carbs and to use alcohol carefully. This information is not intended to replace advice given to you by your health care provider. Make sure you discuss any questions you have with your health care provider. Document Revised: 05/03/2017 Document Reviewed: 06/25/2016 Elsevier Patient Education  [place].  Diabetes Mellitus and Exercise Exercising regularly is important for your overall health, especially when you  have diabetes (diabetes mellitus). Exercising is not only about losing weight. It has many other health benefits, such as increasing muscle strength and bone density and reducing body fat and stress. This leads to improved fitness, flexibility, and endurance, all of which result in better overall health. Exercise has additional benefits for people with diabetes, including:  Reducing appetite.  Helping to lower and control blood glucose.  Lowering blood pressure.  Helping to control amounts of fatty substances (lipids) in the blood, such as cholesterol and triglycerides.  Helping the body to respond better to insulin (improving insulin sensitivity).  Reducing how much insulin the body needs.  Decreasing the risk for heart disease by: ? Lowering cholesterol and triglyceride levels. ? Increasing the  levels of good cholesterol. ? Lowering blood glucose levels. What is my activity plan? Your health care provider or certified diabetes educator can help you make a plan for the type and frequency of exercise (activity plan) that works for you. Make sure that you:  Do at least 150 minutes of moderate-intensity or vigorous-intensity exercise each week. This could be brisk walking, biking, or water aerobics. ? Do stretching and strength exercises, such as yoga or weightlifting, at least 2 times a week. ? Spread out your activity over at least 3 days of the week.  Get some form of physical activity every day. ? Do not go more than 2 days in a row without some kind of physical activity. ? Avoid being inactive for more than 30 minutes at a time. Take frequent breaks to walk or stretch.  Choose a type of exercise or activity that you enjoy, and set realistic goals.  Start slowly, and gradually increase the intensity of your exercise over time. What do I need to know about managing my diabetes?   Check your blood glucose before and after exercising. ? If your blood glucose is 240 mg/dL (13.3 mmol/L) or higher before you exercise, check your urine for ketones. If you have ketones in your urine, do not exercise until your blood glucose returns to normal. ? If your blood glucose is 100 mg/dL (5.6 mmol/L) or lower, eat a snack containing 15-20 grams of carbohydrate. Check your blood glucose 15 minutes after the snack to make sure that your level is above 100 mg/dL (5.6 mmol/L) before you start your exercise.  Know the symptoms of low blood glucose (hypoglycemia) and how to treat it. Your risk for hypoglycemia increases during and after exercise. Common symptoms of hypoglycemia can include: ? Hunger. ? Anxiety. ? Sweating and feeling clammy. ? Confusion. ? Dizziness or feeling light-headed. ? Increased heart rate or palpitations. ? Blurry vision. ? Tingling or numbness around the mouth, lips, or  tongue. ? Tremors or shakes. ? Irritability.  Keep a rapid-acting carbohydrate snack available before, during, and after exercise to help prevent or treat hypoglycemia.  Avoid injecting insulin into areas of the body that are going to be exercised. For example, avoid injecting insulin into: ? The arms, when playing tennis. ? The legs, when jogging.  Keep records of your exercise habits. Doing this can help you and your health care provider adjust your diabetes management plan as needed. Write down: ? Food that you eat before and after you exercise. ? Blood glucose levels before and after you exercise. ? The type and amount of exercise you have done. ? When your insulin is expected to peak, if you use insulin. Avoid exercising at times when your insulin is peaking.  When you start  a new exercise or activity, work with your health care provider to make sure the activity is safe for you, and to adjust your insulin, medicines, or food intake as needed.  Drink plenty of water while you exercise to prevent dehydration or heat stroke. Drink enough fluid to keep your urine clear or pale yellow. Summary  Exercising regularly is important for your overall health, especially when you have diabetes (diabetes mellitus).  Exercising has many health benefits, such as increasing muscle strength and bone density and reducing body fat and stress.  Your health care provider or certified diabetes educator can help you make a plan for the type and frequency of exercise (activity plan) that works for you.  When you start a new exercise or activity, work with your health care provider to make sure the activity is safe for you, and to adjust your insulin, medicines, or food intake as needed. This information is not intended to replace advice given to you by your health care provider. Make sure you discuss any questions you have with your health care provider. Document Revised: 12/13/2016 Document Reviewed:  10/31/2015 Elsevier Patient Education  [place].  Diabetes Mellitus and Standards of Medical Care Managing diabetes (diabetes mellitus) can be complicated. Your diabetes treatment may be managed by a team of health care providers, including:  A physician who specializes in diabetes (endocrinologist).  A nurse practitioner or physician assistant.  Nurses.  A diet and nutrition specialist (registered dietitian).  A certified diabetes educator (CDE).  An exercise specialist.  A pharmacist.  An eye doctor.  A foot specialist (podiatrist).  A dentist.  A primary care provider.  A mental health provider. Your health care providers follow guidelines to help you get the best quality of care. The following schedule is a general guideline for your diabetes management plan. Your health care providers may give you more specific instructions. Physical exams Upon being diagnosed with diabetes mellitus, and each year after that, your health care provider will ask about your medical and family history. He or she will also do a physical exam. Your exam may include:  Measuring your height, weight, and body mass index (BMI).  Checking your blood pressure. This will be done at every routine medical visit. Your target blood pressure may vary depending on your medical conditions, your age, and other factors.  Thyroid gland exam.  Skin exam.  Screening for damage to your nerves (peripheral neuropathy). This may include checking the pulse in your legs and feet and checking the level of sensation in your hands and feet.  A complete foot exam to inspect the structure and skin of your feet, including checking for cuts, bruises, redness, blisters, sores, or other problems.  Screening for blood vessel (vascular) problems, which may include checking the pulse in your legs and feet and checking your temperature. Blood tests Depending on your treatment plan and your personal needs, you may  have the following tests done:  HbA1c (hemoglobin A1c). This test provides information about blood sugar (glucose) control over the previous 2-3 months. It is used to adjust your treatment plan, if needed. This test will be done: ? At least 2 times a year, if you are meeting your treatment goals. ? 4 times a year, if you are not meeting your treatment goals or if treatment goals have changed.  Lipid testing, including total, LDL, and HDL cholesterol and triglyceride levels. ? The goal for LDL is less than 100 mg/dL (5.5 mmol/L). If you are  at high risk for complications, the goal is less than 70 mg/dL (3.9 mmol/L). ? The goal for HDL is 40 mg/dL (2.2 mmol/L) or higher for men and 50 mg/dL (2.8 mmol/L) or higher for women. An HDL cholesterol of 60 mg/dL (3.3 mmol/L) or higher gives some protection against heart disease. ? The goal for triglycerides is less than 150 mg/dL (8.3 mmol/L).  Liver function tests.  Kidney function tests.  Thyroid function tests. Dental and eye exams  Visit your dentist two times a year.  If you have type 1 diabetes, your health care provider may recommend an eye exam 3-5 years after you are diagnosed, and then once a year after your first exam. ? For children with type 1 diabetes, a health care provider may recommend an eye exam when your child is age 49 or older and has had diabetes for 3-5 years. After the first exam, your child should get an eye exam once a year.  If you have type 2 diabetes, your health care provider may recommend an eye exam as soon as you are diagnosed, and then once a year after your first exam. Immunizations   The yearly flu (influenza) vaccine is recommended for everyone 6 months or older who has diabetes.  The pneumonia (pneumococcal) vaccine is recommended for everyone 2 years or older who has diabetes. If you are 37 or older, you may get the pneumonia vaccine as a series of two separate shots.  The hepatitis B vaccine is  recommended for adults shortly after being diagnosed with diabetes.  Adults and children with diabetes should receive all other vaccines according to age-specific recommendations from the Centers for Disease Control and Prevention (CDC). Mental and emotional health Screening for symptoms of eating disorders, anxiety, and depression is recommended at the time of diagnosis and afterward as needed. If your screening shows that you have symptoms (positive screening result), you may need more evaluation and you may work with a mental health care provider. Treatment plan Your treatment plan will be reviewed at every medical visit. You and your health care provider will discuss:  How you are taking your medicines, including insulin.  Any side effects you are experiencing.  Your blood glucose target goals.  The frequency of your blood glucose monitoring.  Lifestyle habits, such as activity level as well as tobacco, alcohol, and substance use. Diabetes self-management education Your health care provider will assess how well you are monitoring your blood glucose levels and whether you are taking your insulin correctly. He or she may refer you to:  A certified diabetes educator to manage your diabetes throughout your life, starting at diagnosis.  A registered dietitian who can create or review your personal nutrition plan.  An exercise specialist who can discuss your activity level and exercise plan. Summary  Managing diabetes (diabetes mellitus) can be complicated. Your diabetes treatment may be managed by a team of health care providers.  Your health care providers follow guidelines in order to help you get the best quality of care.  Standards of care including having regular physical exams, blood tests, blood pressure monitoring, immunizations, screening tests, and education about how to manage your diabetes.  Your health care providers may also give you more specific instructions based on  your individual health. This information is not intended to replace advice given to you by your health care provider. Make sure you discuss any questions you have with your health care provider. Document Revised: 02/07/2018 Document Reviewed: 02/17/2016 Elsevier Patient Education  2020 Elsevier Inc.  

## 2019-11-30 NOTE — Progress Notes (Signed)
Subjective:    Patient ID: Marilyn [name], female    DOB: 02-16-1966, 54 y.o.   MRN: [id number]  No chief complaint on file.   HPI Patient was seen today for f/u.  Pt dx/d with DM after hgb A1C was 6.6% on 07/17/19.  Pt also referred to Urology after multiple renal calculi noted on xray abd/pelvis at [facility].  Pt has Urology appt next wk.  Denies hematuria, back or flank pain.  Pt requesting referral to Endo, Dr. Cruzita Lederer for thyroid.  Currently taking synthroid 100 mcg daily.  Pt notes increased allergy symptoms.  Does not really like using nasal spray.  Flonase caused nosebleeds and burning sensation.  Using azelastine starting to cause nose to month.  Patient also taking generic Claritin.  Patient notes feeling stressed about recent diagnosis as family members and friends have been giving her advice.  Patient also notes [employer] nurse keeps calling and texting her.  Pt states she was afraid to eat anything at first.  Currently walking 4-5 miles per day.  Fsbs this am was 109.  Has ranged from 79-154.  Pt lost 10 lbs since visit 1 month ago.  Notes bp has been 1teen/70s.  Pt does not want to take any extra meds.  Not taking lipitor 40 mg.   Past Medical History:  Diagnosis Date  . Allergy   . 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/2007  . ASTHMA 01/30/2007  . Asthma   . Breast tumor    right; benign  . Chest pain 01/24/2017  . GERD 01/31/2007  . Hemorrhoids   . Hiatal hernia   . HTN (hypertension) 09/09/2011  . Hyperlipidemia   . HYPOTHYROIDISM 01/30/2007  . [place] tear 04/2003    Allergies  Allergen Reactions  . Promethazine-Dm   . Shellfish Allergy Anaphylaxis  . Tuna [Fish Allergy] Anaphylaxis  . Codeine Nausea And Vomiting  . Flonase [Fluticasone Propionate] Other (See Comments)    Nose bleeds    ROS General: Denies fever, chills, night sweats, changes in weight, changes in appetite HEENT: Denies headaches, ear pain, changes in vision, rhinorrhea, sore throat CV:  Denies CP, palpitations, SOB, orthopnea Pulm: Denies SOB, cough, wheezing GI: Denies abdominal pain, nausea, vomiting, diarrhea, constipation GU: Denies dysuria, hematuria, frequency, vaginal discharge Msk: Denies muscle cramps, joint pains Neuro: Denies weakness, numbness, tingling Skin: Denies rashes, bruising Psych: Denies depression, anxiety, hallucinations     Objective:    Blood pressure 110/74, pulse 96, temperature 97.8 F (36.6 C), temperature source Temporal, weight 195 lb (88.5 kg), last menstrual period 03/18/2014, SpO2 97 %.  Gen. Pleasant, well-nourished, in no distress, normal affect   HEENT: [place]/AT, face symmetric, conjunctiva clear, no scleral icterus, PERRLA, EOMI, nares patent without drainage, pharynx without erythema or exudate. TMs normal b/l Lungs: no accessory muscle use, CTAB, no wheezes or rales Cardiovascular: RRR, no m/r/g, no peripheral edema Musculoskeletal: No deformities, no cyanosis or clubbing, normal tone Neuro:  A&Ox3, CN II-XII intact, normal gait Skin:  Warm, no lesions/ rash   Wt Readings from Last 3 Encounters:  10/29/19 205 lb (93 kg)  08/05/19 197 lb (89.4 kg)  07/30/19 223 lb (101.2 kg)    Lab Results  Component Value Date   WBC 4.4 07/30/2019   HGB 12.3 07/30/2019   HCT 38.3 07/30/2019   PLT 242 07/30/2019   GLUCOSE 122 (H) 07/30/2019   CHOL 178 07/17/2019   TRIG 89.0 07/17/2019   HDL 46.20 07/17/2019   LDLDIRECT 165.3 12/15/2012  LDLCALC 114 (H) 07/17/2019   ALT 21 07/30/2019   AST 24 07/30/2019   NA 137 07/30/2019   K 3.8 07/30/2019   CL 103 07/30/2019   CREATININE 0.88 07/30/2019   BUN 13 07/30/2019   CO2 24 07/30/2019   TSH 1.06 07/17/2019   HGBA1C 6.6 (H) 07/17/2019   MICROALBUR 1.0 10/29/2019    Assessment/Plan:  Diet-controlled type 2 diabetes mellitus (HCC)  -hgb A1C was 6.6% on 07/17/19 -continue lifestyle modifications - Plan: Ambulatory referral to diabetic education, glucose blood (ONETOUCH VERIO) test  strip  Seasonal allergies  -d/c claritin and azelastin -start saline nasal rinse and allegra -consider nasocort if needed and singulair for continued symptoms - Plan: fexofenadine (ALLEGRA ALLERGY) 180 MG tablet  Hypothyroidism, unspecified type  -continue synthroid 100 mcg daily - Plan: Ambulatory referral to Endocrinology  Mixed HLD -lifestyle modifications encouraged -last lipid panel controlled on 07/17/19  [place] 178, HDL 46, LDL 114 -if needed will restart lipitor 40 mg   F/u in 2-3 months  Grier Mitts, MD

## 2019-12-22 ENCOUNTER — Other Ambulatory Visit: Payer: Self-pay | Admitting: Family Medicine

## 2019-12-30 ENCOUNTER — Ambulatory Visit: Payer: BC Managed Care – PPO | Admitting: Family Medicine

## 2019-12-30 ENCOUNTER — Other Ambulatory Visit: Payer: Self-pay | Admitting: *Deleted

## 2019-12-30 ENCOUNTER — Other Ambulatory Visit: Payer: Self-pay

## 2019-12-30 ENCOUNTER — Encounter: Payer: Self-pay | Admitting: Family Medicine

## 2019-12-30 VITALS — BP 128/80 | HR 96 | Temp 98.3°F | Ht 63.0 in | Wt 191.0 lb

## 2019-12-30 DIAGNOSIS — I10 Essential (primary) hypertension: Secondary | ICD-10-CM

## 2019-12-30 DIAGNOSIS — E119 Type 2 diabetes mellitus without complications: Secondary | ICD-10-CM

## 2019-12-30 DIAGNOSIS — D219 Benign neoplasm of connective and other soft tissue, unspecified: Secondary | ICD-10-CM

## 2019-12-30 DIAGNOSIS — L821 Other seborrheic keratosis: Secondary | ICD-10-CM

## 2019-12-30 DIAGNOSIS — L918 Other hypertrophic disorders of the skin: Secondary | ICD-10-CM | POA: Diagnosis not present

## 2019-12-30 DIAGNOSIS — K219 Gastro-esophageal reflux disease without esophagitis: Secondary | ICD-10-CM

## 2019-12-30 DIAGNOSIS — R1310 Dysphagia, unspecified: Secondary | ICD-10-CM

## 2019-12-30 DIAGNOSIS — J349 Unspecified disorder of nose and nasal sinuses: Secondary | ICD-10-CM

## 2019-12-30 LAB — POCT GLYCOSYLATED HEMOGLOBIN (HGB A1C): Hemoglobin A1C: 5.6 % (ref 4.0–5.6)

## 2019-12-30 MED ORDER — ONETOUCH DELICA PLUS LANCET30G MISC: 0 refills | Status: DC

## 2019-12-30 NOTE — Progress Notes (Signed)
Subjective:    Patient ID: Marilyn [name], female    DOB: 11/30/1965, 54 y.o.   MRN: [id number]  No chief complaint on file.   HPI Pt is a 54 yo female with pmh sig for HTN, GERD, postsurgical hypothyroidism, diet-controlled DM2, HLD, asthma, iron deficiency anemia who was seen for f/u.  Pt states fsbs typically 70-105.  Was 114 this am.  Pt eating more vegetables, walking several miles twice a day, and trying to eat healthy overall.  Pt has an appointment with nutrition in September.  Pt endorses difficulty swallowing at times.  States walked outside in the heat yesterday and felt like it was difficult to breathe/her throat is closing.  Pt drank water which helped relieve symptoms.  Pt also notes h/o heartburn with difficulty swallowing pills and feeling like food gets stuck at times.  In the past was told one of her vocal cords was not functioning properly but nothing was done to correct the problem.  Seen by ENT, Dr. Lucia Gaskins.  Inquires about second opinion.  Denies hoarseness of voice.  Occasionally has a slight sore throat.  Also notes feeling like left side of nose is constantly blocked and difficult to blow.  In the past nasal saline and other nose sprays have caused a burning sensation.  Pt endorses hesitancy about getting a COVID-19 vaccine.  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  Pt also endorses several moles/skin lesions 1 on left breast that has increased in size labs.  Area is not painful, has been present times years.  Has a similar lesion on right upper back and a skin tag in the right axilla.  Pt inquires if she should have fibroids removed.  Endorses having occasional pain in the abdomen and bloating.  Pt denies heavy bleeding.  Followed by OB/GYN, Dr. Harrington Challenger.  Past Medical History:  Diagnosis Date  . Allergy   . 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/2007  . ASTHMA 01/30/2007  . Asthma   . Breast tumor    right; benign  . Chest pain 01/24/2017  . GERD  01/31/2007  . Hemorrhoids   . Hiatal hernia   . HTN (hypertension) 09/09/2011  . Hyperlipidemia   . HYPOTHYROIDISM 01/30/2007  . [place] tear 04/2003    Allergies  Allergen Reactions  . Promethazine-Dm   . Shellfish Allergy Anaphylaxis  . Tuna [Fish Allergy] Anaphylaxis  . Codeine Nausea And Vomiting  . Flonase [Fluticasone Propionate] Other (See Comments)    Nose bleeds    ROS General: Denies fever, chills, night sweats, changes in weight, changes in appetite HEENT: Denies headaches, ear pain, changes in vision, rhinorrhea, sore throat CV: Denies CP, palpitations, SOB, orthopnea Pulm: Denies SOB, cough, wheezing GI: Denies abdominal pain, nausea, vomiting, diarrhea, constipation  +intermittent abd pain/discomfort, dysphagia GU: Denies dysuria, hematuria, frequency, vaginal discharge + fibroids Msk: Denies muscle cramps, joint pains Neuro: Denies weakness, numbness, tingling Skin: Denies rashes, bruising  +moles Psych: Denies depression, anxiety, hallucinations    Objective:    Blood pressure 128/80, pulse 96, temperature 98.3 F (36.8 C), temperature source Oral, height 5\' 3"  (1.6 m), weight 191 lb (86.6 kg), last menstrual period 03/18/2014, SpO2 97 %.  Gen. Pleasant, well-nourished, in no distress, normal affect   HEENT: [place]/AT, face symmetric, conjunctiva clear, no scleral icterus, PERRLA, EOMI, nares patent without drainage, pharynx without erythema or exudate. Lungs: no accessory muscle use, CTAB, no wheezes or rales Cardiovascular: RRR, no m/r/g, no peripheral edema Abdomen: BS present  Musculoskeletal: No deformities, no cyanosis or clubbing, normal tone Neuro:  A&Ox3, CN II-XII intact, normal gait Skin:  Warm, dry, intact, no rash.  A 2 cm seborrheic keratosis on left medial breast and right back.  A large skin tag in the right axilla   Wt Readings from Last 3 Encounters:  11/30/19 195 lb (88.5 kg)  10/29/19 205 lb (93 kg)  08/05/19 197 lb (89.4 kg)     Lab Results  Component Value Date   WBC 4.4 07/30/2019   HGB 12.3 07/30/2019   HCT 38.3 07/30/2019   PLT 242 07/30/2019   GLUCOSE 122 (H) 07/30/2019   CHOL 178 07/17/2019   TRIG 89.0 07/17/2019   HDL 46.20 07/17/2019   LDLDIRECT 165.3 12/15/2012   LDLCALC 114 (H) 07/17/2019   ALT 21 07/30/2019   AST 24 07/30/2019   NA 137 07/30/2019   K 3.8 07/30/2019   CL 103 07/30/2019   CREATININE 0.88 07/30/2019   BUN 13 07/30/2019   CO2 24 07/30/2019   TSH 1.06 07/17/2019   HGBA1C 6.6 (H) 07/17/2019   MICROALBUR 1.0 10/29/2019    Assessment/Plan:  Essential hypertension -Controlled -Continue current medications including Norvasc 10 mg.  Monitor for bilateral lower extremity edema. -Continue lifestyle modifications -Continue checking BP at home  Diet-controlled type 2 diabetes mellitus (HCC) -Continue lifestyle modifications -Last hemoglobin A1c 6.6% on 07/17/2019 - Plan: POCT glycosylated hemoglobin (Hb A1C)  Skin tag -Discussed removal clipping versus cryotherapy -Patient wishes to think about removal.  Seborrheic keratosis -Discussed various methods of removal -Patient considering -We will refer to dermatology if desired.  Dysphagia, unspecified type -Discussed possible causes including worsening GERD, vocal cord dysfunction.  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 Fuller Plan. - Plan: Ambulatory referral to ENT  Gastroesophageal reflux disease, unspecified whether esophagitis present -Avoid foods known to cause problems -Continue Prilosec 40 mg twice daily -Continue follow-up with GI, Dr. Fuller Plan  Fibroids -Patient considering removal -Discussed various options myomectomy versus hysterectomy -Patient advised to follow-up with OB/GYN, Dr. Harrington Challenger  Sinus disorder -Discussed considering a different allergy medication -Given continued symptoms and burning in nares will place referral  to ENT -Consider CT sinuses - Plan: Ambulatory referral to ENT  F/u as needed in the next few months.  Grier Mitts, MD

## 2019-12-30 NOTE — Patient Instructions (Signed)
Dysphagia  Dysphagia is trouble swallowing. This condition occurs when solids and liquids stick in a person's throat on the way down to the stomach, or when food takes longer to get to the stomach than usual. You may have problems swallowing food, liquids, or both. You may also have pain while trying to swallow. It may take you more time and effort to swallow something. What are the causes? This condition may be caused by:  Muscle problems. They may make it difficult for you to move food and liquids through the esophagus, which is the tube that connects your mouth to your stomach.  Blockages. You may have ulcers, scar tissue, or inflammation that blocks the normal passage of food and liquids. Causes of these problems include: ? Acid reflux from your stomach into your esophagus (gastroesophageal reflux). ? Infections. ? Radiation treatment for cancer. ? Medicines taken without enough fluids to wash them down into your stomach.  Stroke. This can affect the nerves and make it difficult to swallow.  Nerve problems. These prevent signals from being sent to the muscles of your esophagus to squeeze (contract) and move what you swallow down to your stomach.  Globus pharyngeus. This is a common problem that involves a feeling like something is stuck in your throat or a sense of trouble with swallowing, even though nothing is wrong with the swallowing passages.  Certain conditions, such as cerebral palsy or Parkinson's disease. What are the signs or symptoms? Common symptoms of this condition include:  A feeling that solids or liquids are stuck in your throat on the way down to the stomach.  Pain while swallowing.  Coughing or gagging while trying to swallow. Other symptoms include:  Food moving back from your stomach to your mouth (regurgitation).  Noises coming from your throat.  Chest discomfort with swallowing.  A feeling of fullness when swallowing.  Drooling, especially when the  throat is blocked.  Heartburn. How is this diagnosed? This condition may be diagnosed by:  Barium X-ray. In this test, you will swallow a white liquid that sticks to the inside of your esophagus. X-ray images are then taken.  Endoscopy. In this test, a flexible telescope is inserted down your throat to look at your esophagus and your stomach.  CT scans and an MRI. How is this treated? Treatment for dysphagia depends on the cause of this condition, such as:  If the dysphagia is caused by acid reflux or infection, medicines may be used. They may include antibiotics and heartburn medicines.  If the dysphagia is caused by problems with the muscles, swallowing therapy may be used to help you strengthen your swallowing muscles. You may have to do specific exercises to strengthen the muscles or stretch them.  If the dysphagia is caused by a blockage or mass, procedures to remove the blockage may be done. You may need surgery and a feeding tube. You may need to make diet changes. Ask your health care provider for specific instructions. Follow these instructions at home: Medicines  Take over-the-counter and prescription medicines only as told by your health care provider.  If you were prescribed an antibiotic medicine, take it as told by your health care provider. Do not stop taking the antibiotic even if you start to feel better. Eating and drinking   Follow any diet changes as told by your health care provider.  Work with a diet and nutrition specialist (dietitian) to create an eating plan that will help you get the nutrients you need in   order to stay healthy.  Eat soft foods that are easier to swallow.  Cut your food into small pieces and eat slowly. Take small bites.  Eat and drink only when you are sitting upright.  Do not drink alcohol or caffeine. If you need help quitting, ask your health care provider. General instructions  Check your weight every day to make sure you are  not losing weight.  Do not use any products that contain nicotine or tobacco, such as cigarettes, e-cigarettes, and chewing tobacco. If you need help quitting, ask your health care provider.  Keep all follow-up visits as told by your health care provider. This is important. Contact a health care provider if you:  Lose weight because you cannot swallow.  Cough when you drink liquids.  Cough up partially digested food. Get help right away if you:  Cannot swallow your saliva.  Have shortness of breath, a fever, or both.  Have a hoarse voice and also have trouble swallowing. Summary  Dysphagia is trouble swallowing. This condition occurs when solids and liquids stick in a person's throat on the way down to the stomach. You may cough or gag while trying to swallow.  Dysphagia has many possible causes.  Treatment for dysphagia depends on the cause of the condition.  Keep all follow-up visits as told by your health care provider. This is important. This information is not intended to replace advice given to you by your health care provider. Make sure you discuss any questions you have with your health care provider. Document Revised: 10/15/2018 Document Reviewed: 10/15/2018 Elsevier Patient Education  [place].  Diabetes Mellitus and Nutrition, Adult When you have diabetes (diabetes mellitus), it is very important to have healthy eating habits because your blood sugar (glucose) levels are greatly affected by what you eat and drink. Eating healthy foods in the appropriate amounts, at about the same times every day, can help you:  Control your blood glucose.  Lower your risk of heart disease.  Improve your blood pressure.  Reach or maintain a healthy weight. Every person with diabetes is different, and each person has different needs for a meal plan. Your health care provider may recommend that you work with a diet and nutrition specialist (dietitian) to make a meal plan  that is best for you. Your meal plan may vary depending on factors such as:  The calories you need.  The medicines you take.  Your weight.  Your blood glucose, blood pressure, and cholesterol levels.  Your activity level.  Other health conditions you have, such as heart or kidney disease. How do carbohydrates affect me? Carbohydrates, also called carbs, affect your blood glucose level more than any other type of food. Eating carbs naturally raises the amount of glucose in your blood. Carb counting is a method for keeping track of how many carbs you eat. Counting carbs is important to keep your blood glucose at a healthy level, especially if you use insulin or take certain oral diabetes medicines. It is important to know how many carbs you can safely have in each meal. This is different for every person. Your dietitian can help you calculate how many carbs you should have at each meal and for each snack. Foods that contain carbs include:  Bread, cereal, rice, pasta, and crackers.  Potatoes and corn.  Peas, beans, and lentils.  Milk and yogurt.  Fruit and juice.  Desserts, such as cakes, cookies, ice cream, and candy. How does alcohol affect me? Alcohol  can cause a sudden decrease in blood glucose (hypoglycemia), especially if you use insulin or take certain oral diabetes medicines. Hypoglycemia can be a life-threatening condition. Symptoms of hypoglycemia (sleepiness, dizziness, and confusion) are similar to symptoms of having too much alcohol. If your health care provider says that alcohol is safe for you, follow these guidelines:  Limit alcohol intake to no more than 1 drink per day for nonpregnant women and 2 drinks per day for men. One drink equals 12 oz of beer, 5 oz of wine, or 1 oz of hard liquor.  Do not drink on an empty stomach.  Keep yourself hydrated with water, diet soda, or unsweetened iced tea.  Keep in mind that regular soda, juice, and other mixers may contain  a lot of sugar and must be counted as carbs. What are tips for following this plan?  Reading food labels  Start by checking the serving size on the "Nutrition Facts" label of packaged foods and drinks. The amount of calories, carbs, fats, and other nutrients listed on the label is based on one serving of the item. Many items contain more than one serving per package.  Check the total grams (g) of carbs in one serving. You can calculate the number of servings of carbs in one serving by dividing the total carbs by 15. For example, if a food has 30 g of total carbs, it would be equal to 2 servings of carbs.  Check the number of grams (g) of saturated and trans fats in one serving. Choose foods that have low or no amount of these fats.  Check the number of milligrams (mg) of salt (sodium) in one serving. Most people should limit total sodium intake to less than 2,300 mg per day.  Always check the nutrition information of foods labeled as "low-fat" or "nonfat". These foods may be higher in added sugar or refined carbs and should be avoided.  Talk to your dietitian to identify your daily goals for nutrients listed on the label. Shopping  Avoid buying canned, premade, or processed foods. These foods tend to be high in fat, sodium, and added sugar.  Shop around the outside edge of the grocery store. This includes fresh fruits and vegetables, bulk grains, fresh meats, and fresh dairy. Cooking  Use low-heat cooking methods, such as baking, instead of high-heat cooking methods like deep frying.  Cook using healthy oils, such as olive, canola, or sunflower oil.  Avoid cooking with butter, cream, or high-fat meats. Meal planning  Eat meals and snacks regularly, preferably at the same times every day. Avoid going long periods of time without eating.  Eat foods high in fiber, such as fresh fruits, vegetables, beans, and whole grains. Talk to your dietitian about how many servings of carbs you can  eat at each meal.  Eat 4-6 ounces (oz) of lean protein each day, such as lean meat, chicken, fish, eggs, or tofu. One oz of lean protein is equal to: ? 1 oz of meat, chicken, or fish. ? 1 egg. ?  cup of tofu.  Eat some foods each day that contain healthy fats, such as avocado, nuts, seeds, and fish. Lifestyle  Check your blood glucose regularly.  Exercise regularly as told by your health care provider. This may include: ? 150 minutes of moderate-intensity or vigorous-intensity exercise each week. This could be brisk walking, biking, or water aerobics. ? Stretching and doing strength exercises, such as yoga or weightlifting, at least 2 times a week.  Take  medicines as told by your health care provider.  Do not use any products that contain nicotine or tobacco, such as cigarettes and e-cigarettes. If you need help quitting, ask your health care provider.  Work with a Social worker or diabetes educator to identify strategies to manage stress and any emotional and social challenges. Questions to ask a health care provider  Do I need to meet with a diabetes educator?  Do I need to meet with a dietitian?  What number can I call if I have questions?  When are the best times to check my blood glucose? Where to find more information:  American Diabetes Association: diabetes.org  Academy of Nutrition and Dietetics: www.eatright.[employer] of Diabetes and Digestive and Kidney Diseases (NIH): [employer] Summary  A healthy meal plan will help you control your blood glucose and maintain a healthy lifestyle.  Working with a diet and nutrition specialist (dietitian) can help you make a meal plan that is best for you.  Keep in mind that carbohydrates (carbs) and alcohol have immediate effects on your blood glucose levels. It is important to count carbs and to use alcohol carefully. This information is not intended to replace advice given to you by your health care  provider. Make sure you discuss any questions you have with your health care provider. Document Revised: 05/03/2017 Document Reviewed: 06/25/2016 Elsevier Patient Education  2020 [place], Adult  A skin tag (acrochordon) is a soft, extra growth of skin. Most skin tags are flesh-colored and rarely bigger than a pencil eraser. They commonly form near areas where there are folds in the skin, such as the armpit or groin. Skin tags are not dangerous, and they do not spread from person to person (are not contagious). You may have one skin tag or several. Skin tags do not require treatment. However, your health care provider may recommend removal of a skin tag if it:  Gets irritated from clothing.  Bleeds.  Is visible and unsightly. Your health care provider can remove skin tags with a simple surgical procedure or a procedure that involves freezing the skin tag. Follow these instructions at home:  Watch for any changes in your skin tag. A normal skin tag does not require any other special care at home.  Take over-the-counter and prescription medicines only as told by your health care provider.  Keep all follow-up visits as told by your health care provider. This is important. Contact a health care provider if:  You have a skin tag that: ? Becomes painful. ? Changes color. ? Bleeds. ? Swells.  You develop more skin tags. This information is not intended to replace advice given to you by your health care provider. Make sure you discuss any questions you have with your health care provider. Document Revised: 05/03/2017 Document Reviewed: 06/05/2015 Elsevier Patient Education  [place].  Seborrheic Keratosis A seborrheic keratosis is a common, noncancerous (benign) skin growth. These growths are velvety, waxy, rough, tan, brown, or black spots that appear on the skin. These skin growths can be flat or raised, and scaly. What are the causes? The cause of this  condition is not known. What increases the risk? You are more likely to develop this condition if you:  Have a family history of seborrheic keratosis.  Are 50 or older.  Are pregnant.  Have had estrogen replacement therapy. What are the signs or symptoms? Symptoms of this condition include growths on the face, chest, shoulders, back, or other  areas. These growths:  Are usually painless, but may become irritated and itchy.  Can be yellow, brown, black, or other colors.  Are slightly raised or have a flat surface.  Are sometimes rough or wart-like in texture.  Are often velvety or waxy on the surface.  Are round or oval-shaped.  Often occur in groups, but may occur as a single growth. How is this diagnosed? This condition is diagnosed with a medical history and physical exam.  A sample of the growth may be tested (skin biopsy).  You may need to see a skin specialist (dermatologist). How is this treated? Treatment is not usually needed for this condition, unless the growths are irritated or bleed often.  You may also choose to have the growths removed if you do not like their appearance. ? Most commonly, these growths are treated with a procedure in which liquid nitrogen is applied to "freeze" off the growth (cryosurgery). ? They may also be burned off with electricity (electrocautery) or removed by scraping (curettage). Follow these instructions at home:  Watch your growth for any changes.  Keep all follow-up visits as told by your health care provider. This is important.  Do not scratch or pick at the growth or growths. This can cause them to become irritated or infected. Contact a health care provider if:  You suddenly have many new growths.  Your growth bleeds, itches, or hurts.  Your growth suddenly becomes larger or changes color. Summary  A seborrheic keratosis is a common, noncancerous (benign) skin growth.  Treatment is not usually needed for this  condition, unless the growths are irritated or bleed often.  Watch your growth for any changes.  Contact a health care provider if you suddenly have many new growths or your growth suddenly becomes larger or changes color.  Keep all follow-up visits as told by your health care provider. This is important. This information is not intended to replace advice given to you by your health care provider. Make sure you discuss any questions you have with your health care provider. Document Revised: 10/03/2017 Document Reviewed: 10/03/2017 Elsevier Patient Education  2020 [place].  Uterine Fibroids  Uterine fibroids (leiomyomas) are noncancerous (benign) tumors that can develop in the uterus. Fibroids may also develop in the fallopian tubes, cervix, or tissues (ligaments) near the uterus. You may have one or many fibroids. Fibroids vary in size, weight, and where they grow in the uterus. Some can become quite large. Most fibroids do not require medical treatment. What are the causes? The cause of this condition is not known. What increases the risk? You are more likely to develop this condition if you:  Are in your 30s or 40s and have not gone through menopause.  Have a family history of this condition.  Are of African-American descent.  Had your first period at an early age (early menarche).  Have not had any children (nulliparity).  Are overweight or obese. What are the signs or symptoms? Many women do not have any symptoms. Symptoms of this condition may include:  Heavy menstrual bleeding.  Bleeding or spotting between periods.  Pain and pressure in the pelvic area, between the hips.  Bladder problems, such as needing to urinate urgently or more often than usual.  Inability to have children (infertility).  Failure to carry pregnancy to term (miscarriage). How is this diagnosed? This condition may be diagnosed based on:  Your symptoms and medical history.  A physical  exam.  A pelvic exam that  includes feeling for any tumors.  Imaging tests, such as ultrasound or MRI. How is this treated? Treatment for this condition may include:  Seeing your health care provider for follow-up visits to monitor your fibroids for any changes.  Taking NSAIDs such as ibuprofen, naproxen, or aspirin to reduce pain.  Hormone medicines. These may be taken as a pill, given in an injection, or delivered by a T-shaped device that is inserted into the uterus (intrauterine device, IUD).  Surgery to remove one of the following: ? The fibroids (myomectomy). Your health care provider may recommend this if fibroids affect your fertility and you want to become pregnant. ? The uterus (hysterectomy). ? Blood supply to the fibroids (uterine artery embolization). Follow these instructions at home:  Take over-the-counter and prescription medicines only as told by your health care provider.  Ask your health care provider if you should take iron pills or eat more iron-rich foods, such as dark green, leafy vegetables. Heavy menstrual bleeding can cause low iron levels.  If directed, apply heat to your back or abdomen to reduce pain. Use the heat source that your health care provider recommends, such as a moist heat pack or a heating pad. ? Place a towel between your skin and the heat source. ? Leave the heat on for 20-30 minutes. ? Remove the heat if your skin turns bright red. This is especially important if you are unable to feel pain, heat, or cold. You may have a greater risk of getting burned.  Pay close attention to your menstrual cycle. Tell your health care provider about any changes, such as: ? Increased blood flow that requires you to use more pads or tampons than usual. ? A change in the number of days that your period lasts. ? A change in symptoms that are associated with your period, such as back pain or cramps in your abdomen.  Keep all follow-up visits as told by your  health care provider. This is important, especially if your fibroids need to be monitored for any changes. Contact a health care provider if you:  Have pelvic pain, back pain, or cramps in your abdomen that do not get better with medicine or heat.  Develop new bleeding between periods.  Have increased bleeding during or between periods.  Feel unusually tired or weak.  Feel light-headed. Get help right away if you:  Faint.  Have pelvic pain that suddenly gets worse.  Have severe vaginal bleeding that soaks a tampon or pad in 30 minutes or less. Summary  Uterine fibroids are noncancerous (benign) tumors that can develop in the uterus.  The exact cause of this condition is not known.  Most fibroids do not require medical treatment unless they affect your ability to have children (fertility).  Contact a health care provider if you have pelvic pain, back pain, or cramps in your abdomen that do not get better with medicines.  Make sure you know what symptoms should cause you to get help right away. This information is not intended to replace advice given to you by your health care provider. Make sure you discuss any questions you have with your health care provider. Document Revised: 05/03/2017 Document Reviewed: 04/16/2017 Elsevier Patient Education  [place].  Cryoablation Cryoablation is a procedure used to remove abnormal growths or cancerous tissue. This is done by freezing the growth or tissue with either liquid nitrogen or argon gas. This procedure may be done to treat many conditions, including:  Skin tumors.  Non-cancerous (  benign) nodules.  A type of eye cancer (retinoblastoma).  Cancers of the prostate, liver, kidney, cervix, lung, and bone. Tell a health care provider about:  Any allergies you have.  All medicines you are taking, including vitamins, herbs, eye drops, creams, and over-the-counter medicines.  Any problems you or family members have had  with anesthetic medicines.  Any blood disorders you have.  Any surgeries you have had.  Any medical conditions you have.  Whether you are pregnant or may be pregnant. What are the risks? Generally, this is a safe procedure. However, problems may occur, including:  Infection.  Bleeding.  Swelling.  Nerve damage and loss of feeling. This is rare.  Allergic reactions to medicines.  Damage to other structures or organs. What happens before the procedure? Staying hydrated Follow instructions from your health care provider about hydration, which may include:  Up to 2 hours before the procedure - you may continue to drink clear liquids, such as water, clear fruit juice, black coffee, and plain tea. Eating and drinking restrictions Follow instructions from your health care provider about eating and drinking, which may include:  8 hours before the procedure - stop eating heavy meals or foods such as meat, fried foods, or fatty foods.  6 hours before the procedure - stop eating light meals or foods, such as toast or cereal.  6 hours before the procedure - stop drinking milk or drinks that contain milk.  2 hours before the procedure - stop drinking clear liquids. Medicines  Ask your health care provider about: ? Changing or stopping your regular medicines. This is especially important if you are taking diabetes medicines or blood thinners. ? Taking medicines such as aspirin and ibuprofen. These medicines can thin your blood. Do not take these medicines before your procedure if your health care provider instructs you not to.  You may be given antibiotic medicine to help prevent infection. General instructions  You may have blood tests.  Plan to have someone take you home from the hospital or clinic.  If you will be going home right after the procedure, plan to have someone with you for 24 hours.  Ask your health care provider how your surgical site will be marked or  identified. What happens during the procedure?  To reduce your risk of infection: ? Your health care team will wash or sanitize their hands. ? Your skin will be washed with soap. ? Hair may be removed from the surgical area.  An IV tube will be inserted into one of your veins.  You will be given one or more of the following: ? A medicine to help you relax (sedative). ? A medicine to numb the area (local anesthetic). ? A medicine to make you fall asleep (general anesthetic). ? A medicine that is injected into your spine to numb the area below and slightly above the injection site (spinal anesthetic). ? A medicine that is injected into an area of your body to numb everything below the injection site (regional anesthetic).  Depending on the location of the growth, a small incision may be made. A bronchoscope may be used for a lung tumor, or a laparoscope may be used for an abdominal tumor.  Your health care provider may use a device (cryoprobe) that has liquid nitrogen or argon gas flowing through it. The cryoprobe will be inserted through the incision to the area where the tumor is located. This may be done with guidance from imaging such as an ultrasound, CT  scan, or MRI scan.  Liquid nitrogen or argon gas will be delivered through the cryoprobe to the growth until it is frozen and destroyed.  The process may be repeated on other areas depending on how many areas need treatment.  The cryoprobe will be removed and pressure will be applied to stop any bleeding.  The incision will be closed with stitches (sutures).  The incision will be covered with a bandage (dressing). The procedure will vary depending on the location of the tumor or nodule. The procedure may also vary among health care providers and hospitals. What happens after the procedure?  Do not drive for 24 hours if you received a sedative.  Your blood pressure, heart rate, breathing rate, and blood oxygen level will be  monitored until the medicines you were given have worn off.  You will be given medicine to help with pain, nausea, and vomiting as needed. This information is not intended to replace advice given to you by your health care provider. Make sure you discuss any questions you have with your health care provider. Document Revised: 05/03/2017 Document Reviewed: 10/19/2015 Elsevier Patient Education  [place].

## 2020-01-17 ENCOUNTER — Other Ambulatory Visit: Payer: Self-pay | Admitting: Family Medicine

## 2020-01-17 DIAGNOSIS — E890 Postprocedural hypothyroidism: Secondary | ICD-10-CM

## 2020-02-15 ENCOUNTER — Encounter: Payer: BC Managed Care – PPO | Attending: Family Medicine | Admitting: Registered"

## 2020-02-15 ENCOUNTER — Other Ambulatory Visit: Payer: Self-pay

## 2020-02-15 ENCOUNTER — Encounter: Payer: Self-pay | Admitting: Registered"

## 2020-02-15 DIAGNOSIS — E119 Type 2 diabetes mellitus without complications: Secondary | ICD-10-CM | POA: Diagnosis not present

## 2020-02-15 NOTE — Progress Notes (Signed)
Diabetes Self-Management Education  Visit Type: First/Initial  Appt. Start Time: 1500 Appt. End Time: 1620  02/15/2020  Marilyn [name], identified by name and date of birth, is a 54 y.o. female with a diagnosis of Diabetes: Type 2.   ASSESSMENT  Last menstrual period 03/18/2014. There is no height or weight on file to calculate BMI.   Patient states she is motivate to make her diabetes go away. Pt reports after she was diagnosed she was afraid to eat and with the help of friends who have diabetes and the program through her insurance, [employer], she has liberalized her diet and found that her numbers are staying 80s-130. Pt states the main change that she made was increasing her physical activity. Before school started patient states she was walking 5x/week 5 miles. Pt reports since going back to work she is still working to find a way to fit walking back into her routine.  Patient states her BP is excellent and has improved with the increased exercise.  Pt states she has not taken liptor for about 3 months and thinks her cholesterol must be okay since her MD has not said anything about her lab values.   Pt reports she is alltegic to tuna, other fish is fine and enjoys sardines and crackers.   Pt states she got burned out on vegetables and doesn't eat them as often now, doesn't eat beans hurts because causes so much gas. Pt reports milk also causes her stomach to balloon out.   Diabetes Self-Management Education - 02/15/20 1506      Visit Information   Visit Type First/Initial      Initial Visit   Diabetes Type Type 2    Are you currently following a meal plan? No    Are you taking your medications as prescribed? Not on Medications    Date Diagnosed 3 yrs ago      Health Coping   How would you rate your overall health? Fair      Psychosocial Assessment   Patient Belief/Attitude about Diabetes Motivated to manage diabetes    How often do you need to have someone help you  when you read instructions, pamphlets, or other written materials from your doctor or pharmacy? 1 - Never    What is the last grade level you completed in school? 12      Complications   Last HgB A1C per patient/outside source 5.6 %    How often do you check your blood sugar? 1-2 times/day    Fasting Blood glucose range (mg/dL) 70-129   80-131   Postprandial Blood glucose range (mg/dL) 70-129    Number of hypoglycemic episodes per month 0    Have you had a dilated eye exam in the past 12 months? Yes    Have you had a dental exam in the past 12 months? No   2020   Are you checking your feet? Yes    How many days per week are you checking your feet? 7      Dietary Intake   Breakfast bagel, cream cheese or raisin bread cream cheese OR sausage, egg, cheese, whole grain bread OR smoked sausage egg OR yogurt toast    Snack (morning) none OR peanut butter crackers OR cheese puffs OR grapes    Lunch bologna, cheese sandwich    Snack (afternoon) cookies    Dinner tomato sandwich OR chick-fil-a fried chicken, fries, water    Snack (evening) fruit OR crackers OR fig newtons  OR prunes    Beverage(s) water, regular gingerale, orange juice followed by bottle of water      Exercise   Exercise Type ADL's      Patient Education   Previous Diabetes Education Yes (please comment)    Disease state  Definition of diabetes, type 1 and 2, and the diagnosis of diabetes    Nutrition management  Role of diet in the treatment of diabetes and the relationship between the three main macronutrients and blood glucose level;Food label reading, portion sizes and measuring food.    Physical activity and exercise  Role of exercise on diabetes management, blood pressure control and cardiac health.      Individualized Goals (developed by patient)   Nutrition General guidelines for healthy choices and portions discussed    Physical Activity Exercise 3-5 times per week    Reducing Risk increase portions of healthy  fats;Other (comment)   decrease saturated fat     Outcomes   Expected Outcomes Demonstrated interest in learning. Expect positive outcomes    Future DMSE 4-6 wks    Program Status Completed           Individualized Plan for Diabetes Self-Management Training:   Learning Objective:  Patient will have a greater understanding of diabetes self-management. Patient education plan is to attend individual and/or group sessions per assessed needs and concerns.   Plan:   Patient Instructions  Goals: Increase exercise to 3 days per week, 45 minutes minutes. Continue staying active on non-exercise days. Aim to eat vegetables daily, Continue choosing 100% whole grain Consider having whole fruit instead of juice Consider less saturated fat and having unsaturated fat   [email or web address]  Www.snopes.com fact checker   Expected Outcomes:  Demonstrated interest in learning. Expect positive outcomes  Education material provided: Planning healthy meals, carb card  If problems or questions, patient to contact team via:  Phone and MyChart  Future DSME appointment: 4-6 wks

## 2020-02-15 NOTE — Patient Instructions (Addendum)
Goals: Increase exercise to 3 days per week, 45 minutes minutes. Continue staying active on non-exercise days. Aim to eat vegetables daily, Continue choosing 100% whole grain Consider having whole fruit instead of juice Consider less saturated fat and having unsaturated fat   [email or web address]  Www.snopes.com fact checker

## 2020-02-18 ENCOUNTER — Other Ambulatory Visit: Payer: Self-pay | Admitting: Family Medicine

## 2020-02-18 DIAGNOSIS — I10 Essential (primary) hypertension: Secondary | ICD-10-CM

## 2020-02-24 ENCOUNTER — Other Ambulatory Visit: Payer: Self-pay | Admitting: Gastroenterology

## 2020-03-01 ENCOUNTER — Telehealth: Payer: Self-pay | Admitting: Family Medicine

## 2020-03-01 NOTE — Telephone Encounter (Signed)
Patient would like a referral to Endocrinology to see Dr. Philemon Kingdom    Please advise

## 2020-03-01 NOTE — Telephone Encounter (Signed)
Spoke with pt aware that  referral was placed back in 11/2019 with a request for Marilyn [name] per pt request, Advised pt to call the Endocrinology office and schedule appointment.

## 2020-03-04 ENCOUNTER — Encounter: Payer: Self-pay | Admitting: Family Medicine

## 2020-03-04 ENCOUNTER — Ambulatory Visit (INDEPENDENT_AMBULATORY_CARE_PROVIDER_SITE_OTHER): Payer: BC Managed Care – PPO

## 2020-03-04 ENCOUNTER — Other Ambulatory Visit: Payer: Self-pay

## 2020-03-04 ENCOUNTER — Ambulatory Visit (INDEPENDENT_AMBULATORY_CARE_PROVIDER_SITE_OTHER): Payer: BC Managed Care – PPO | Admitting: Family Medicine

## 2020-03-04 VITALS — BP 112/78 | HR 90 | Temp 98.9°F | Wt 186.4 lb

## 2020-03-04 DIAGNOSIS — R5383 Other fatigue: Secondary | ICD-10-CM | POA: Diagnosis not present

## 2020-03-04 DIAGNOSIS — R079 Chest pain, unspecified: Secondary | ICD-10-CM

## 2020-03-04 DIAGNOSIS — M542 Cervicalgia: Secondary | ICD-10-CM | POA: Diagnosis not present

## 2020-03-04 NOTE — Patient Instructions (Signed)
Fatigue If you have fatigue, you feel tired all the time and have a lack of energy or a lack of motivation. Fatigue may make it difficult to start or complete tasks because of exhaustion. In general, occasional or mild fatigue is often a normal response to activity or life. However, long-lasting (chronic) or extreme fatigue may be a symptom of a medical condition. Follow these instructions at home: General instructions  Watch your fatigue for any changes.  Go to bed and get up at the same time every day.  Avoid fatigue by pacing yourself during the day and getting enough sleep at night.  Maintain a healthy weight. Medicines  Take over-the-counter and prescription medicines only as told by your health care provider.  Take a multivitamin, if told by your health care provider.  Do not use herbal or dietary supplements unless they are approved by your health care provider. Activity   Exercise regularly, as told by your health care provider.  Use or practice techniques to help you relax, such as yoga, tai chi, meditation, or massage therapy. Eating and drinking   Avoid heavy meals in the evening.  Eat a well-balanced diet, which includes lean proteins, whole grains, plenty of fruits and vegetables, and low-fat dairy products.  Avoid consuming too much caffeine.  Avoid the use of alcohol.  Drink enough fluid to keep your urine pale yellow. Lifestyle  Change situations that cause you stress. Try to keep your work and personal schedule in balance.  Do not use any products that contain nicotine or tobacco, such as cigarettes and e-cigarettes. If you need help quitting, ask your health care provider.  Do not use drugs. Contact a health care provider if:  Your fatigue does not get better.  You have a fever.  You suddenly lose or gain weight.  You have headaches.  You have trouble falling asleep or sleeping through the night.  You feel angry, guilty, anxious, or  sad.  You are unable to have a bowel movement (constipation).  Your skin is dry.  You have swelling in your legs or another part of your body. Get help right away if:  You feel confused.  Your vision is blurry.  You feel faint or you pass out.  You have a severe headache.  You have severe pain in your abdomen, your back, or the area between your waist and hips (pelvis).  You have chest pain, shortness of breath, or an irregular or fast heartbeat.  You are unable to urinate, or you urinate less than normal.  You have abnormal bleeding, such as bleeding from the rectum, vagina, nose, lungs, or nipples.  You vomit blood.  You have thoughts about hurting yourself or others. If you ever feel like you may hurt yourself or others, or have thoughts about taking your own life, get help right away. You can go to your nearest emergency department or call:  Your local emergency services (911 in the [place]  A suicide crisis helpline, such as the [facility] at 9164775271. This is open 24 hours a day. Summary  If you have fatigue, you feel tired all the time and have a lack of energy or a lack of motivation.  Fatigue may make it difficult to start or complete tasks because of exhaustion.  Long-lasting (chronic) or extreme fatigue may be a symptom of a medical condition.  Exercise regularly, as told by your health care provider.  Change situations that cause you stress. Try to keep your  work and personal schedule in balance. This information is not intended to replace advice given to you by your health care provider. Make sure you discuss any questions you have with your health care provider. Document Revised: 12/10/2018 Document Reviewed: 02/13/2017 Elsevier Patient Education  [place].  Nonspecific Chest Pain, Adult Chest pain can be caused by many different conditions. It can be caused by a condition that is life-threatening and requires  treatment right away. It can also be caused by something that is not life-threatening. If you have chest pain, it can be hard to know the difference, so it is important to get help right away to make sure that you do not have a serious condition. Some life-threatening causes of chest pain include:  Heart attack.  A tear in the body's main blood vessel (aortic dissection).  Inflammation around your heart (pericarditis).  A problem in the lungs, such as a blood clot (pulmonary embolism) or a collapsed lung (pneumothorax). Some non life-threatening causes of chest pain include:  Heartburn.  Anxiety or stress.  Damage to the bones, muscles, and cartilage that make up your chest wall.  Pneumonia or bronchitis.  Shingles infection (varicella-zoster virus). Chest pain can feel like:  Pain or discomfort on the surface of your chest or deep in your chest.  Crushing, pressure, aching, or squeezing pain.  Burning or tingling.  Dull or sharp pain that is worse when you move, cough, or take a deep breath.  Pain or discomfort that is also felt in your back, neck, jaw, shoulder, or arm, or pain that spreads to any of these areas. Your chest pain may come and go. It may also be constant. Your health care provider will do lab tests and other studies to find the cause of your pain. Treatment will depend on the cause of your chest pain. Follow these instructions at home: Medicines  Take over-the-counter and prescription medicines only as told by your health care provider.  If you were prescribed an antibiotic, take it as told by your health care provider. Do not stop taking the antibiotic even if you start to feel better. Lifestyle   Rest as directed by your health care provider.  Do not use any products that contain nicotine or tobacco, such as cigarettes and e-cigarettes. If you need help quitting, ask your health care provider.  Do not drink alcohol.  Make healthy lifestyle choices as  recommended. These may include: ? Getting regular exercise. Ask your health care provider to suggest some activities that are safe for you. ? Eating a heart-healthy diet. This includes plenty of fresh fruits and vegetables, whole grains, low-fat (lean) protein, and low-fat dairy products. A dietitian can help you find healthy eating options. ? Maintaining a healthy weight. ? Managing any other health conditions you have, such as high blood pressure (hypertension) or diabetes. ? Reducing stress, such as with yoga or relaxation techniques. General instructions  Pay attention to any changes in your symptoms. Tell your health care provider about them or any new symptoms.  Avoid any activities that cause chest pain.  Keep all follow-up visits as told by your health care provider. This is important. This includes visits for any further testing if your chest pain does not go away. Contact a health care provider if:  Your chest pain does not go away.  You feel depressed.  You have a fever. Get help right away if:  Your chest pain gets worse.  You have a cough that gets  worse, or you cough up blood.  You have severe pain in your abdomen.  You faint.  You have sudden, unexplained chest discomfort.  You have sudden, unexplained discomfort in your arms, back, neck, or jaw.  You have shortness of breath at any time.  You suddenly start to sweat, or your skin gets clammy.  You feel nausea or you vomit.  You suddenly feel lightheaded or dizzy.  You have severe weakness, or unexplained weakness or fatigue.  Your heart begins to beat quickly, or it feels like it is skipping beats. These symptoms may represent a serious problem that is an emergency. Do not wait to see if the symptoms will go away. Get medical help right away. Call your local emergency services (911 in the [place] Do not drive yourself to the hospital. Summary  Chest pain can be caused by a condition that is serious and  requires urgent treatment. It may also be caused by something that is not life-threatening.  If you have chest pain, it is very important to see your health care provider. Your health care provider may do lab tests and other studies to find the cause of your pain.  Follow your health care provider's instructions on taking medicines, making lifestyle changes, and getting emergency treatment if symptoms become worse.  Keep all follow-up visits as told by your health care provider. This includes visits for any further testing if your chest pain does not go away. This information is not intended to replace advice given to you by your health care provider. Make sure you discuss any questions you have with your health care provider. Document Revised: 11/21/2017 Document Reviewed: 11/21/2017 Elsevier Patient Education  2020 [employer].

## 2020-03-04 NOTE — Progress Notes (Signed)
Subjective:    Patient ID: Marilyn [name], female    DOB: 12-19-1965, 54 y.o.   MRN: [id number]  No chief complaint on file.   HPI Pt is a 54 yo female with pmh sig for HTN, GERD, Hypothyroidism, HLD, asthma, DM II (diet controlled), fibroids, recurrent sinusitis, obesity who was seen today for acute conerns.  Pt endorses L neck pain since her accident (~9/3) and intermittent CP. Feels like "a fat lady sitting on chest".  Tried muscle relaxer and ice. Pt also endorses lightheadedness and fatigue.  Pt notes increased stress at work.  BP and pulse have been lower than they typically are.  Pulse around 70s and bp in the 1teens/70s.  Past Medical History:  Diagnosis Date  . Allergy   . 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/2007  . ASTHMA 01/30/2007  . Asthma   . Breast tumor    right; benign  . Chest pain 01/24/2017  . GERD 01/31/2007  . Hemorrhoids   . Hiatal hernia   . HTN (hypertension) 09/09/2011  . Hyperlipidemia   . HYPOTHYROIDISM 01/30/2007  . [place] tear 04/2003    Allergies  Allergen Reactions  . Promethazine-Dm   . Shellfish Allergy Anaphylaxis  . Tuna [Fish Allergy] Anaphylaxis  . Codeine Nausea And Vomiting  . Flonase [Fluticasone Propionate] Other (See Comments)    Nose bleeds    ROS General: Denies fever, chills, night sweats, changes in weight, changes in appetite  +fatigue, lightheadedness, changes in bp and pulse HEENT: Denies headaches, ear pain, changes in vision, rhinorrhea, sore throat CV: Denies palpitations, SOB, orthopnea  +CP, chest pressure Pulm: Denies SOB, cough, wheezing GI: Denies abdominal pain, nausea, vomiting, diarrhea, constipation GU: Denies dysuria, hematuria, frequency, vaginal discharge Msk: Denies muscle cramps, joint pains   Neuro: Denies weakness, numbness, tingling Skin: Denies rashes, bruising Psych: Denies depression, anxiety, hallucinations  +stress    Objective:    Blood pressure 112/78, pulse 90, temperature 98.9 F (37.2  C), temperature source Oral, weight 186 lb 6.4 oz (84.6 kg), last menstrual period 03/18/2014, SpO2 96 %.   Gen. Pleasant, well-nourished, in no distress, normal affect   HEENT: [place]/AT, face symmetric, conjunctiva clear, no scleral icterus, PERRLA, EOMI, nares patent without drainage, pharynx without erythema or exudate. Neck: No JVD, no thyromegaly, no carotid bruits Lungs: no accessory muscle use, CTAB, no wheezes or rales Cardiovascular: RRR, no m/r/g, no peripheral edema Musculoskeletal: No deformities, no cyanosis or clubbing, normal tone Neuro:  A&Ox3, CN II-XII intact, normal gait Skin:  Warm, no lesions/ rash   Wt Readings from Last 3 Encounters:  03/04/20 186 lb 6.4 oz (84.6 kg)  12/30/19 191 lb (86.6 kg)  11/30/19 195 lb (88.5 kg)    Lab Results  Component Value Date   WBC 4.4 07/30/2019   HGB 12.3 07/30/2019   HCT 38.3 07/30/2019   PLT 242 07/30/2019   GLUCOSE 122 (H) 07/30/2019   CHOL 178 07/17/2019   TRIG 89.0 07/17/2019   HDL 46.20 07/17/2019   LDLDIRECT 165.3 12/15/2012   LDLCALC 114 (H) 07/17/2019   ALT 21 07/30/2019   AST 24 07/30/2019   NA 137 07/30/2019   K 3.8 07/30/2019   CL 103 07/30/2019   CREATININE 0.88 07/30/2019   BUN 13 07/30/2019   CO2 24 07/30/2019   TSH 1.06 07/17/2019   HGBA1C 5.6 12/30/2019   MICROALBUR 1.0 10/29/2019    Assessment/Plan:  Chest pain, unspecified type  -concern for cardiac/cardiovascular etiology given reported symptoms.  Also consider infection,  GERD,  -EKG this visit with NSR, VR 75, PR interval 200 ms, an low voltage in precordial leads. No ST elevation or depression noted.  -Low voltage seen in previous EKG from 05/14/2019 -will obtain CXR to r/o infection -given precautions - Plan: EKG 12-Lead, Ambulatory referral to Cardiology, CMP with eGFR([facility]), Troponin I, Brain Natriuretic Peptide, DG Chest 2 View  Neck pain -discussed possible causes including muscle strain. Must r/o CV related causes -will obtain  EKG and labs as stated above -continue supportive care including heat, massage, NSAIDs, topical analgesics prn  Fatigue, unspecified type  -consider increased stress, CV etiology -will obtain labs, EKG, and imaging - Plan: Vitamin D, 25-hydroxy, TSH, CBC with Differential/Platelet, CBC with Differential/Platelet, TSH, Vitamin D, 25-hydroxy  F/u prn in the next wk  Grier Mitts, MD

## 2020-03-05 LAB — CBC WITH DIFFERENTIAL/PLATELET
Absolute Monocytes: 344 cells/uL (ref 200–950)
Basophils Absolute: 9 cells/uL (ref 0–200)
Basophils Relative: 0.2 %
Eosinophils Absolute: 120 cells/uL (ref 15–500)
Eosinophils Relative: 2.8 %
HCT: 38.4 % (ref 35.0–45.0)
Hemoglobin: 12.5 g/dL (ref 11.7–15.5)
Lymphs Abs: 1926 cells/uL (ref 850–3900)
MCH: 26.9 pg — ABNORMAL LOW (ref 27.0–33.0)
MCHC: 32.6 g/dL (ref 32.0–36.0)
MCV: 82.8 fL (ref 80.0–100.0)
MPV: 10.5 fL (ref 7.5–12.5)
Monocytes Relative: 8 %
Neutro Abs: 1901 cells/uL (ref 1500–7800)
Neutrophils Relative %: 44.2 %
Platelets: 255 10*3/uL (ref 140–400)
RBC: 4.64 10*6/uL (ref 3.80–5.10)
RDW: 13.8 % (ref 11.0–15.0)
Total Lymphocyte: 44.8 %
WBC: 4.3 10*3/uL (ref 3.8–10.8)

## 2020-03-05 LAB — BRAIN NATRIURETIC PEPTIDE: Brain Natriuretic Peptide: <4 pg/mL (ref <100)

## 2020-03-05 LAB — COMPLETE METABOLIC PANEL WITH GFR
AG Ratio: 1.3 (calc) (ref 1.0–2.5)
ALT: 18 U/L (ref 6–29)
AST: 23 U/L (ref 10–35)
Albumin: 4.3 g/dL (ref 3.6–5.1)
Alkaline phosphatase (APISO): 68 U/L (ref 37–153)
BUN: 12 mg/dL (ref 7–25)
CO2: 28 mmol/L (ref 20–32)
Calcium: 9.1 mg/dL (ref 8.6–10.4)
Chloride: 102 mmol/L (ref 98–110)
Creat: 0.8 mg/dL (ref 0.50–1.05)
GFR, Est African American: 96 mL/min/{1.73_m2} (ref >=60)
GFR, Est Non African American: 82 mL/min/{1.73_m2} (ref >=60)
Globulin: 3.2 g/dL (calc) (ref 1.9–3.7)
Glucose, Bld: 87 mg/dL (ref 65–99)
Potassium: 4.1 mmol/L (ref 3.5–5.3)
Sodium: 139 mmol/L (ref 135–146)
Total Bilirubin: 0.3 mg/dL (ref 0.2–1.2)
Total Protein: 7.5 g/dL (ref 6.1–8.1)

## 2020-03-05 LAB — TIQ-MISC

## 2020-03-05 LAB — VITAMIN D 25 HYDROXY (VIT D DEFICIENCY, FRACTURES): Vit D, 25-Hydroxy: 60 ng/mL (ref 30–100)

## 2020-03-05 LAB — TSH: TSH: 0.52 mIU/L (ref 0.40–4.50)

## 2020-03-05 LAB — TROPONIN I: Troponin I: <3 ng/L (ref <=47)

## 2020-03-08 ENCOUNTER — Encounter: Payer: Self-pay | Admitting: Family Medicine

## 2020-03-09 ENCOUNTER — Ambulatory Visit: Payer: BC Managed Care – PPO | Admitting: Family Medicine

## 2020-03-21 ENCOUNTER — Encounter: Payer: Self-pay | Admitting: Registered"

## 2020-03-21 ENCOUNTER — Encounter: Payer: BC Managed Care – PPO | Attending: Family Medicine | Admitting: Registered"

## 2020-03-21 ENCOUNTER — Other Ambulatory Visit: Payer: Self-pay

## 2020-03-21 DIAGNOSIS — E119 Type 2 diabetes mellitus without complications: Secondary | ICD-10-CM | POA: Insufficient documentation

## 2020-03-21 NOTE — Patient Instructions (Addendum)
Continue with your plan to get out the yoga medication CD, consider making an appointment with yourself. Until your toe gets better, consider trying some arm chair exercises. Continue working on reducing processed meats and saturated fat and getting more unsaturated fat. Continue eating fish 3 times per week. Consider working up to 3 servings of vegetables per day Since lima beans didn't bother you too much, good idea to include regularly

## 2020-03-21 NOTE — Progress Notes (Signed)
Diabetes Self-Management Education  Visit Type: Follow-up  Appt. Start Time: 1525 Appt. End Time: 7829  03/21/2020  Ms. Leathie Weich, identified by name and date of birth, is a 54 y.o. female with a diagnosis of Diabetes: Type 2.   ASSESSMENT  Height 5\' 3"  (1.6 m), weight 181 lb 6.4 oz (82.3 kg), last menstrual period 03/18/2014. Body mass index is 32.13 kg/m.   Patient states at her last MD appt recently her A1c was updated and her MD was pleased with how well her blood sugar is controlled. Pt states after last visit she has cut back on juice and other drinks that may affect blood sugar. Pt states she is eating more fruit. Pt estimates she is eating 1-2 servings of vegetables per day. Pt states although she doesn't eat beans due to uncomfortable gas, lima beans didn't have adverse effect for her.  Pt states she feels she is doing well with her physical activity and is looking to expand what she is doing by incorporating yoga and meditation. Patient states she weights 181.4 lb which is ~5 lb weight loss.  Pt states 4 days ago dropped cast iron skillet on toe preventing from keeping up with exercising. Pt states she was starting to feel better and walked 4 miles and it is hurting again.  Pt continues to eat similar breakfast as last visit which may be high in saturated fat. This visit we discussed ways to cut back saturated fat and include more foods that are higher in unsaturated fats.  RD discussed with patient the difference of a diet that will help control blood sugar but may still be improved on for heart health. Even though patient's current habits are doing well to control blood sugar, patient may benefit from follow-up nutrition visits for heart disease prevention. Patient states she may call back for follow-up visits.    Diabetes Self-Management Education - 03/21/20 1528      Visit Information   Visit Type Follow-up      Initial Visit   Diabetes Type Type 2      Dietary  Intake   Breakfast smoked sausage links, eggs beaters with a little cheese, grits, waffle    Lunch chicken nuggets, no sauce    Snack (afternoon) almonds OR fruit OR yogurt OR fig newtons    Dinner cornbeef hash, cabbage, cornbread, fruit    Beverage(s) water mostly, cutting back on juice and soda      Patient Education   Nutrition management  Other (comment)   saturated vs unsaturated fat   Physical activity and exercise  Helped patient identify appropriate exercises in relation to his/her diabetes, diabetes complications and other health issue.      Individualized Goals (developed by patient)   Nutrition General guidelines for healthy choices and portions discussed    Physical Activity Exercise 3-5 times per week    Reducing Risk increase portions of healthy fats      Outcomes   Expected Outcomes Demonstrated interest in learning. Expect positive outcomes    Future DMSE PRN    Program Status Completed      Subsequent Visit   Since your last visit have you experienced any weight changes? Loss    Weight Loss (lbs) 5           Individualized Plan for Diabetes Self-Management Training:   Learning Objective:  Patient will have a greater understanding of diabetes self-management. Patient education plan is to attend individual and/or group sessions per assessed needs and  concerns.   Patient Instructions  Continue with your plan to get out the yoga medication CD, consider making an appointment with yourself. Until your toe gets better, consider trying some arm chair exercises. Continue working on reducing processed meats and saturated fat and getting more unsaturated fat. Continue eating fish 3 times per week. Consider working up to 3 servings of vegetables per day Since lima beans didn't bother you too much, good idea to include regularly   Expected Outcomes:  Demonstrated interest in learning. Expect positive outcomes  Education material provided: arm chair exercises  If  problems or questions, patient to contact team via:  Phone and MyChart  Future DSME appointment: PRN

## 2020-03-30 ENCOUNTER — Ambulatory Visit (INDEPENDENT_AMBULATORY_CARE_PROVIDER_SITE_OTHER): Payer: BC Managed Care – PPO | Admitting: Internal Medicine

## 2020-03-30 ENCOUNTER — Other Ambulatory Visit: Payer: Self-pay

## 2020-03-30 ENCOUNTER — Encounter: Payer: Self-pay | Admitting: Internal Medicine

## 2020-03-30 VITALS — BP 120/80 | HR 98 | Ht 63.0 in | Wt 184.8 lb

## 2020-03-30 DIAGNOSIS — R079 Chest pain, unspecified: Secondary | ICD-10-CM | POA: Diagnosis not present

## 2020-03-30 DIAGNOSIS — E785 Hyperlipidemia, unspecified: Secondary | ICD-10-CM | POA: Diagnosis not present

## 2020-03-30 DIAGNOSIS — E119 Type 2 diabetes mellitus without complications: Secondary | ICD-10-CM

## 2020-03-30 DIAGNOSIS — R072 Precordial pain: Secondary | ICD-10-CM | POA: Diagnosis not present

## 2020-03-30 DIAGNOSIS — I10 Essential (primary) hypertension: Secondary | ICD-10-CM

## 2020-03-30 MED ORDER — ATORVASTATIN CALCIUM 20 MG PO TABS: 20.0000 mg | ORAL_TABLET | Freq: Every day | ORAL | 2 refills | Status: DC

## 2020-03-30 MED ORDER — ASPIRIN EC 81 MG PO TBEC: 81.0000 mg | DELAYED_RELEASE_TABLET | Freq: Every day | ORAL | 3 refills | Status: DC

## 2020-03-30 MED ORDER — METOPROLOL TARTRATE 100 MG PO TABS: 100.0000 mg | ORAL_TABLET | Freq: Once | ORAL | 0 refills | Status: DC
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
## 2020-03-30 NOTE — Patient Instructions (Signed)
Medication Instructions:   START TAKING ASPIRIN 81 MG BY MOUTH DAILY  START TAKING ATORVASTATIN 20 MG BY MOUTH DAILY  *If you need a refill on your cardiac medications before your next appointment, please call your pharmacy*  Lab Work:  IN 3 MONTHS HERE IN THE OFFICE--WE WILL CHECK LIPIDS AND LFTs--PLEASE COME FASTING TO THIS LAB APPOINTMENT.  If you have labs (blood work) drawn today and your tests are completely normal, you will receive your results only by: [name] MyChart Message (if you have MyChart) OR . A paper copy in the mail If you have any lab test that is abnormal or we need to change your treatment, we will call you to review the results.   Testing/Procedures:  Your cardiac CT will be scheduled at one of the below locations:   [facility] [address] [place], [place] [address] [phone]  If scheduled at [facility], please arrive at the [facility] main entrance of [facility] 30 minutes prior to test start time. Proceed to the [facility] Radiology Department (first floor) to check-in and test prep.  Please follow these instructions carefully (unless otherwise directed):  On the Night Before the Test: . Be sure to Drink plenty of water. . Do not consume any caffeinated/decaffeinated beverages or chocolate 12 hours prior to your test. . Do not take any antihistamines 12 hours prior to your test.  On the Day of the Test: . Drink plenty of water. Do not drink any water within one hour of the test. . Do not eat any food 4 hours prior to the test. . You may take your regular medications prior to the test.  . Take metoprolol (Lopressor) 100 mg by mouth two hours prior to test. . FEMALES- please wear underwire-free bra if available  After the Test: . Drink plenty of water. . After receiving IV contrast, you may experience a mild flushed feeling. This is normal. . On occasion, you may experience a mild rash up to 24 hours after the  test. This is not dangerous. If this occurs, you can take Benadryl 25 mg and increase your fluid intake. . If you experience trouble breathing, this can be serious. If it is severe call 911 IMMEDIATELY. If it is mild, please call our office. . If you take any of these medications: Glipizide/Metformin, Avandament, Glucavance, please do not take 48 hours after completing test unless otherwise instructed.  Once we have confirmed authorization from your insurance company, we will call you to set up a date and time for your test. Based on how quickly your insurance processes prior authorizations requests, please allow up to 4 weeks to be contacted for scheduling your Cardiac CT appointment. Be advised that routine Cardiac CT appointments could be scheduled as many as 8 weeks after your provider has ordered it.  For non-scheduling related questions, please contact the cardiac imaging nurse navigator should you have any questions/concerns: Marchia Bond, Cardiac Imaging Nurse Navigator Burley Saver, Interim Cardiac Imaging Nurse [facility] Direct Office Dial: [phone]   For scheduling needs, including cancellations and rescheduling, please call Vivien Rota at [phone], option 3.    Follow-Up:  4-5 MONTHS IN THE OFFICE WITH DR. Gasper Sells

## 2020-03-31 ENCOUNTER — Ambulatory Visit: Payer: BC Managed Care – PPO | Admitting: Family Medicine

## 2020-03-31 ENCOUNTER — Other Ambulatory Visit: Payer: Self-pay | Admitting: Family Medicine

## 2020-04-05 ENCOUNTER — Telehealth: Payer: Self-pay | Admitting: *Deleted

## 2020-04-05 NOTE — Telephone Encounter (Signed)
-----  Message from Zeb Comfort, RN sent at 04/05/2020  8:21 AM EDT ----- Regarding: RE: ct heart Looks like she doesn't take any diabetes medications and her most recent labs are normal.  So I'd say we don't need labs and typically don't require labs for pre-diabetes.  [place] ----- Message ----- From: Roosvelt Maser Sent: 04/04/2020   4:00 PM EDT To: Nuala Alpha, LPN, Lorenza Evangelist, RN, # Subject: ct heart                                         Scheduled 11/8 @ 3:15pm. Patient states she is pre diabetic not sure if she will labs  Vivien Rota

## 2020-04-05 NOTE — Telephone Encounter (Signed)
-----   Message from Nuala Alpha, LPN sent at 92/06/1939  4:05 PM EDT ----- Regarding: FW: ct heart  ----- Message ----- From: Roosvelt Maser Sent: 04/04/2020   4:00 PM EDT To: Nuala Alpha, LPN, Lorenza Evangelist, RN, # Subject: ct heart                                         Scheduled 11/8 @ 3:15pm. Patient states she is pre diabetic not sure if she will labs  Vivien Rota

## 2020-04-07 ENCOUNTER — Telehealth (HOSPITAL_COMMUNITY): Payer: Self-pay | Admitting: Emergency Medicine

## 2020-04-07 NOTE — Telephone Encounter (Signed)
Attempted to call patient regarding upcoming cardiac CT appointment. °Left message on voicemail with name and callback number °Anneke Cundy RN Navigator Cardiac Imaging °[facility] °[phone] Office °[phone] Cell ° °

## 2020-04-11 ENCOUNTER — Other Ambulatory Visit: Payer: Self-pay

## 2020-04-11 ENCOUNTER — Ambulatory Visit (HOSPITAL_COMMUNITY)
Admission: RE | Admit: 2020-04-11 | Discharge: 2020-04-11 | Disposition: A | Discharge status: Home / Self Care | Payer: BC Managed Care – PPO | Source: Ambulatory Visit | Attending: Internal Medicine | Admitting: Internal Medicine

## 2020-04-11 ENCOUNTER — Encounter: Payer: Self-pay | Admitting: *Deleted

## 2020-04-11 DIAGNOSIS — I10 Essential (primary) hypertension: Secondary | ICD-10-CM | POA: Diagnosis present

## 2020-04-11 DIAGNOSIS — R072 Precordial pain: Secondary | ICD-10-CM | POA: Diagnosis not present

## 2020-04-11 DIAGNOSIS — E785 Hyperlipidemia, unspecified: Secondary | ICD-10-CM | POA: Insufficient documentation

## 2020-04-11 DIAGNOSIS — R079 Chest pain, unspecified: Secondary | ICD-10-CM | POA: Diagnosis present

## 2020-04-11 DIAGNOSIS — E119 Type 2 diabetes mellitus without complications: Secondary | ICD-10-CM | POA: Diagnosis not present

## 2020-04-11 DIAGNOSIS — Z006 Encounter for examination for normal comparison and control in clinical research program: Secondary | ICD-10-CM

## 2020-04-11 MED ORDER — METOPROLOL TARTRATE 5 MG/5ML IV SOLN
INTRAVENOUS | Status: AC
  Filled 2020-04-11: qty 5

## 2020-04-11 MED ORDER — NITROGLYCERIN 0.4 MG SL SUBL: 0.8000 mg | SUBLINGUAL_TABLET | Freq: Once | SUBLINGUAL | Status: DC

## 2020-04-11 MED ORDER — METOPROLOL TARTRATE 5 MG/5ML IV SOLN
5.0000 mg | INTRAVENOUS | Status: DC | PRN
  Administered 2020-04-11: 5 mg via INTRAVENOUS

## 2020-04-11 MED ORDER — IOHEXOL 350 MG/ML SOLN
80.0000 mL | Freq: Once | INTRAVENOUS | Status: AC | PRN
  Administered 2020-04-11: 80 mL via INTRAVENOUS

## 2020-04-11 MED ORDER — NITROGLYCERIN 0.4 MG SL SUBL
SUBLINGUAL_TABLET | SUBLINGUAL | Status: AC
  Administered 2020-04-11: 0.8 mg
  Filled 2020-04-11: qty 2

## 2020-04-11 NOTE — Research (Signed)
IDENTIFY Informed Consent                  Subject Name:   Marilyn [name]   Subject met inclusion and exclusion criteria.  The informed consent form, study requirements and expectations were reviewed with the subject and questions and concerns were addressed prior to the signing of the consent form.  The subject verbalized understanding of the trial requirements.  The subject agreed to participate in the IDENTIFY trial and signed the informed consent.  The informed consent was obtained prior to performance of any protocol-specific procedures for the subject.  A copy of the signed informed consent was given to the subject and a copy was placed in the subject's medical record.   [place] Loran Auguste, Research Assistant  04/11/2020 14:35 p.m.

## 2020-04-12 ENCOUNTER — Telehealth: Payer: Self-pay | Admitting: Family Medicine

## 2020-04-12 NOTE — Telephone Encounter (Signed)
Error

## 2020-04-16 ENCOUNTER — Other Ambulatory Visit: Payer: Self-pay | Admitting: Family Medicine

## 2020-04-16 DIAGNOSIS — E890 Postprocedural hypothyroidism: Secondary | ICD-10-CM

## 2020-04-20 ENCOUNTER — Other Ambulatory Visit: Payer: Self-pay | Admitting: Family Medicine

## 2020-04-20 DIAGNOSIS — Z0000 Encounter for general adult medical examination without abnormal findings: Secondary | ICD-10-CM

## 2020-05-06 ENCOUNTER — Emergency Department (HOSPITAL_COMMUNITY)
Admission: EM | Admit: 2020-05-06 | Discharge: 2020-05-06 | Disposition: A | Discharge status: Home / Self Care | Payer: BC Managed Care – PPO | Attending: Emergency Medicine | Admitting: Emergency Medicine

## 2020-05-06 ENCOUNTER — Encounter (HOSPITAL_COMMUNITY): Payer: Self-pay | Admitting: Emergency Medicine

## 2020-05-06 DIAGNOSIS — Z79899 Other long term (current) drug therapy: Secondary | ICD-10-CM | POA: Diagnosis not present

## 2020-05-06 DIAGNOSIS — X58XXXA Exposure to other specified factors, initial encounter: Secondary | ICD-10-CM | POA: Insufficient documentation

## 2020-05-06 DIAGNOSIS — Z87891 Personal history of nicotine dependence: Secondary | ICD-10-CM | POA: Insufficient documentation

## 2020-05-06 DIAGNOSIS — H5711 Ocular pain, right eye: Secondary | ICD-10-CM | POA: Diagnosis present

## 2020-05-06 DIAGNOSIS — T2691XA Corrosion of right eye and adnexa, part unspecified, initial encounter: Secondary | ICD-10-CM | POA: Insufficient documentation

## 2020-05-06 DIAGNOSIS — Y9289 Other specified places as the place of occurrence of the external cause: Secondary | ICD-10-CM | POA: Diagnosis not present

## 2020-05-06 DIAGNOSIS — T65891A Toxic effect of other specified substances, accidental (unintentional), initial encounter: Secondary | ICD-10-CM | POA: Diagnosis not present

## 2020-05-06 DIAGNOSIS — I10 Essential (primary) hypertension: Secondary | ICD-10-CM | POA: Diagnosis not present

## 2020-05-06 DIAGNOSIS — Z7982 Long term (current) use of aspirin: Secondary | ICD-10-CM | POA: Diagnosis not present

## 2020-05-06 DIAGNOSIS — E119 Type 2 diabetes mellitus without complications: Secondary | ICD-10-CM | POA: Diagnosis not present

## 2020-05-06 DIAGNOSIS — J45909 Unspecified asthma, uncomplicated: Secondary | ICD-10-CM | POA: Diagnosis not present

## 2020-05-06 DIAGNOSIS — E039 Hypothyroidism, unspecified: Secondary | ICD-10-CM | POA: Insufficient documentation

## 2020-05-06 DIAGNOSIS — Z853 Personal history of malignant neoplasm of breast: Secondary | ICD-10-CM | POA: Insufficient documentation

## 2020-05-06 MED ORDER — TETRACAINE HCL 0.5 % OP SOLN
2.0000 [drp] | Freq: Once | OPHTHALMIC | Status: DC
  Filled 2020-05-06: qty 4

## 2020-05-06 MED ORDER — FLUORESCEIN SODIUM 1 MG OP STRP
1.0000 | ORAL_STRIP | Freq: Once | OPHTHALMIC | Status: DC
  Filled 2020-05-06: qty 1

## 2020-05-06 NOTE — ED Provider Notes (Signed)
[facility] DEPT Provider Note   CSN: [id number] Arrival date & time: 05/06/20  1810     History Chief Complaint  Patient presents with  . Eye Problem    Marilyn [name] is a 54 y.o. female.  Pt presents to the ED today with right eye pain.  She accidentally put fluocinonide ear drops into her right eye about 30 min pta.  She washed her eye out with water and came here because it still burns.          Past Medical History:  Diagnosis Date  . Allergy   . 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/2007  . ASTHMA 01/30/2007  . Asthma   . Breast tumor    right; benign  . Chest pain 01/24/2017  . GERD 01/31/2007  . Hemorrhoids   . Hiatal hernia   . HTN (hypertension) 09/09/2011  . Hyperlipidemia   . HYPOTHYROIDISM 01/30/2007  . Mack Guise tear 04/2003    Patient Active Problem List   Diagnosis Date Noted  . Diet-controlled type 2 diabetes mellitus ([place]) 07/20/2019  . Swollen neck 04/09/2018  . Urinary retention 08/05/2017  . Atypical chest pain 01/23/2017  . Nausea & vomiting 07/16/2015  . RUQ abdominal pain 07/16/2015  . RLQ abdominal pain 07/15/2015  . Pain in lower back 11/30/2014  . Acute upper respiratory infection 05/05/2014  . Allergic rhinitis 05/05/2014  . Hoarseness 05/05/2014  . Dysphagia 05/05/2014  . Obesity 04/11/2014  . Edema, peripheral 04/11/2014  . Cough 12/20/2012  . Hypersomnolence 12/15/2012  . DOE (dyspnea on exertion) 12/15/2012  . Right shoulder pain 12/15/2012  . Abdominal pain 11/15/2011  . Diabetes mellitus with coincident hypertension ([place]) 09/09/2011  . Neck pain 09/09/2011  . Preventative health care 04/30/2011  . HOT FLASHES 12/22/2009  . CHEST PAIN 01/29/2009  . Acute non-recurrent maxillary sinusitis 09/25/2008  . HYPERTHYROIDISM 01/31/2007  . GERD 01/31/2007  . Hypothyroidism 01/30/2007  . Hyperlipidemia 01/30/2007  . Iron deficiency anemia 01/30/2007  . ASTHMA 01/30/2007    Past Surgical  History:  Procedure Laterality Date  . BREAST BIOPSY Right 12/2014  . BREAST EXCISIONAL BIOPSY Right 2016  . BREAST LUMPECTOMY WITH RADIOACTIVE SEED LOCALIZATION Right 02/25/2015   Procedure: BREAST LUMPECTOMY WITH RADIOACTIVE SEED LOCALIZATION;  Surgeon: Alphonsa Overall, MD;  Location: [facility];  Service: General;  Laterality: Right;  . BREAST REDUCTION SURGERY  1986  . BREAST SURGERY    . broken jaw    . CARPAL TUNNEL RELEASE  2001  . FRACTURE SURGERY    . REDUCTION MAMMAPLASTY Bilateral   . THYROIDECTOMY    . TUBAL LIGATION  1987     OB History    Gravida  4   Para  3   Term      Preterm      AB  1   Living  3     SAB      TAB  1   Ectopic      Multiple      Live Births              Family History  Problem Relation Age of Onset  . Hypertension Father   . Diabetes Father   . Hyperlipidemia Father   . Asthma Mother   . Breast cancer Sister 29  . Thyroid disease Sister   . Diabetes Maternal Grandmother   . Heart disease Maternal Grandmother   . Heart attack Maternal Grandmother 60  . Stroke Maternal Grandmother   .  Asthma Maternal Grandfather   . Heart attack Maternal Grandfather   . Stroke Maternal Grandfather   . Colon cancer Neg Hx     Social History   Tobacco Use  . Smoking status: Former Smoker    Packs/day: 0.25    Years: 3.00    Pack years: 0.75    Types: Cigarettes    Quit date: 06/04/1980    Years since quitting: 39.9  . Smokeless tobacco: Never Used  Vaping Use  . Vaping Use: Never used  Substance Use Topics  . Alcohol use: No    Alcohol/week: 0.0 standard drinks  . Drug use: No    Home Medications Prior to Admission medications   Medication Sig Start Date End Date Taking? Authorizing Provider  acetaminophen (TYLENOL) 500 MG tablet Take 500 mg by mouth as needed for moderate pain (arthritis pain).    [provider]  albuterol (VENTOLIN HFA) 108 (90 Base) MCG/ACT inhaler Inhale 2 puffs into the lungs  every 4 (four) hours as needed for wheezing or shortness of breath (cough, shortness of breath or wheezing.). 06/15/19   Billie Ruddy, MD  amLODipine (NORVASC) 10 MG tablet TAKE ONE TABLET BY MOUTH DAILY 02/18/20   Billie Ruddy, MD  aspirin EC 81 MG tablet Take 1 tablet (81 mg total) by mouth daily. Swallow whole. 03/30/20   Chandrasekhar, Lyda Kalata A, MD  atorvastatin (LIPITOR) 20 MG tablet Take 1 tablet (20 mg total) by mouth daily. 03/30/20   Chandrasekhar, Mahesh A, MD  azelastine (ASTELIN) 0.1 % nasal spray Place 1 spray into both nostrils 2 (two) times daily as needed for allergies.  12/09/18   [provider]  Blood Glucose Monitoring Suppl ([place]) w/Device KIT Use to check blood glucose daily (E11.69) 11/03/19   Billie Ruddy, MD  Cholecalciferol (VITAMIN D) 125 MCG (5000 UT) CAPS Take 1 tablet by mouth daily.    [provider]  cyclobenzaprine (FLEXERIL) 5 MG tablet Take 1 tablet (5 mg total) by mouth 3 (three) times daily as needed. 12/27/17   Jaynee Eagles, PA-C  EPIPEN 2-PAK 0.3 MG/0.3ML SOAJ injection Inject 0.3 mLs (0.3 mg total) into the muscle as needed for anaphylaxis. 06/15/19   Billie Ruddy, MD  fexofenadine (ALLEGRA ALLERGY) 180 MG tablet Take 1 tablet (180 mg total) by mouth daily. 11/30/19   Billie Ruddy, MD  glucose blood (ONETOUCH VERIO) test strip Use as instructed 11/30/19   Billie Ruddy, MD  hydrOXYzine (ATARAX/VISTARIL) 50 MG tablet Take 1 tablet (50 mg total) by mouth 3 (three) times daily as needed. 02/04/19   Billie Ruddy, MD  Lancets (ONETOUCH DELICA PLUS [id number]) MISC USE TO TEST BLOOD SUGAR DAILY 03/31/20   Billie Ruddy, MD  levothyroxine (SYNTHROID) 100 MCG tablet TAKE ONE TABLET BY MOUTH DAILY 04/18/20   Billie Ruddy, MD  meloxicam (MOBIC) 7.5 MG tablet Take 1 tablet (7.5 mg total) by mouth daily. 10/10/19   Tacy Learn, PA-C  methocarbamol (ROBAXIN) 500 MG tablet Take 1 tablet (500 mg total) by mouth 2  (two) times daily. 10/10/19   Tacy Learn, PA-C  metoprolol tartrate (LOPRESSOR) 100 MG tablet Take 1 tablet (100 mg total) by mouth once for 1 dose. Take 2 hours prior to your Coronary CT. 03/30/20 03/30/20  Werner Lean, MD  omeprazole (PRILOSEC) 40 MG capsule Take 1 capsule (40 mg total) by mouth 2 (two) times a day. 12/24/18   Ladene Artist, MD  ondansetron (ZOFRAN-ODT) 4 MG disintegrating tablet Take 1 tablet (4 mg total) by mouth every 8 (eight) hours as needed for nausea or vomiting. 07/30/19   Davonna Belling, MD  polyethylene glycol powder (GLYCOLAX/MIRALAX) powder Take 17 g by mouth daily. Patient taking differently: Take 17 g by mouth daily as needed for mild constipation.  12/27/17   Jaynee Eagles, PA-C  Probiotic Product (PROBIOTIC PO) Take 2 tablets by mouth daily.    [provider]  sucralfate (CARAFATE) 1 g tablet Take 1 tablet (1 g total) by mouth 4 (four) times daily -  with meals and at bedtime. 04/10/18 07/30/19  Zehr, Laban Emperor, PA-C  vitamin B-12 (CYANOCOBALAMIN) 500 MCG tablet Take 500 mcg by mouth daily.    [provider]    Allergies    Promethazine-dm, Blain Pais allergy], Codeine, Flonase [fluticasone propionate], Fluticasone propionate, and Promethazine  Review of Systems   Review of Systems  Eyes: Positive for pain and redness.  All other systems reviewed and are negative.   Physical Exam Updated Vital Signs BP (!) 139/91 (BP Location: Right Arm)   Pulse 95   Temp 97.8 F (36.6 C) (Oral)   Resp 16   Ht _0  (1.6 m)   Wt 83.5 kg   LMP 03/18/2014   SpO2 100%   BMI 32.59 kg/m   Physical Exam Vitals and nursing note reviewed.  Constitutional:      Appearance: Normal appearance.  HENT:     Head: Normocephalic and atraumatic.     Right Ear: External ear normal.     Left Ear: External ear normal.     Nose: Nose normal.     Mouth/Throat:     Mouth: Mucous membranes are moist.     Pharynx: Oropharynx is clear.  Eyes:      Conjunctiva/sclera:     Right eye: Right conjunctiva is injected.     Pupils: Pupils are equal, round, and reactive to light.     Right eye: No corneal abrasion or fluorescein uptake.  Cardiovascular:     Rate and Rhythm: Normal rate and regular rhythm.     Pulses: Normal pulses.     Heart sounds: Normal heart sounds.  Pulmonary:     Effort: Pulmonary effort is normal.     Breath sounds: Normal breath sounds.  Abdominal:     General: Abdomen is flat. Bowel sounds are normal.     Palpations: Abdomen is soft.  Musculoskeletal:        General: Normal range of motion.     Cervical back: Normal range of motion and neck supple.  Skin:    General: Skin is warm.     Capillary Refill: Capillary refill takes less than 2 seconds.  Neurological:     General: No focal deficit present.     Mental Status: She is alert and oriented to person, place, and time.  Psychiatric:        Mood and Affect: Mood normal.        Behavior: Behavior normal.     ED Results / Procedures / Treatments   Labs (all labs ordered are listed, but only abnormal results are displayed) Labs Reviewed - No data to display  EKG None  Radiology No results found.  Procedures Procedures (including critical care time)  Medications Ordered in ED Medications  fluorescein ophthalmic strip 1 strip (has no administration in time range)  tetracaine (PONTOCAINE) 0.5 % ophthalmic solution 2 drop (has no administration in time range)  ED Course  I have reviewed the triage vital signs and the nursing notes.  Pertinent labs & imaging results that were available during my care of the patient were reviewed by me and considered in my medical decision making (see chart for details).    MDM Rules/Calculators/A&P                          Right eye irrigated with 1L NS.  PH of right eye is 7.0.  Pt is feeling better after irrigation.  No more redness.    Visual acuity is 20/60 right eye and 20/50 on the left.  Pt wears  glasses and this was w/o glasses.  Pt is to f/u with ophthalmology.    Return if worse.  Final Clinical Impression(s) / ED Diagnoses Final diagnoses:  Chemical burn of right eye    Rx / DC Orders ED Discharge Orders    None       Isla Pence, MD 05/06/20 2133

## 2020-05-06 NOTE — ED Notes (Signed)
Visual acuity screening for patient performed. Right eye 20/60, this is the bad eye. Left eye is 20/50. Woods lamp and materials at bedside for exam.

## 2020-05-06 NOTE — ED Triage Notes (Signed)
Pt states she accidentally put her Fluocinonide ear drop solution in her right eye ~ 30 minutes ago. Reports her eye immediately started to burn and pt washed eye out with water. Pt denies vision changes, headache, dizziness. States her eye still burns.

## 2020-05-30 ENCOUNTER — Telehealth: Payer: Self-pay | Admitting: Internal Medicine

## 2020-05-30 NOTE — Telephone Encounter (Signed)
    Pt wanted to verify if she still need to get her blood work done since her CT came back normal

## 2020-05-30 NOTE — Telephone Encounter (Signed)
Lab appointment has been moved to 08/03/20 before her f/u OV on 08/04/20

## 2020-06-02 ENCOUNTER — Other Ambulatory Visit: Payer: BC Managed Care – PPO

## 2020-06-30 ENCOUNTER — Ambulatory Visit
Admission: RE | Admit: 2020-06-30 | Discharge: 2020-06-30 | Disposition: A | Discharge status: Home / Self Care | Payer: BC Managed Care – PPO | Source: Ambulatory Visit | Attending: Family Medicine | Admitting: Family Medicine

## 2020-06-30 ENCOUNTER — Other Ambulatory Visit: Payer: Self-pay

## 2020-06-30 DIAGNOSIS — Z0000 Encounter for general adult medical examination without abnormal findings: Secondary | ICD-10-CM

## 2020-07-01 LAB — HM MAMMOGRAPHY

## 2020-07-03 ENCOUNTER — Other Ambulatory Visit: Payer: Self-pay | Admitting: Family Medicine

## 2020-07-08 ENCOUNTER — Encounter: Payer: Self-pay | Admitting: Family Medicine

## 2020-07-18 ENCOUNTER — Other Ambulatory Visit: Payer: Self-pay | Admitting: Family Medicine

## 2020-07-18 DIAGNOSIS — E890 Postprocedural hypothyroidism: Secondary | ICD-10-CM

## 2020-07-19 ENCOUNTER — Other Ambulatory Visit: Payer: Self-pay | Admitting: Family Medicine

## 2020-07-19 DIAGNOSIS — E119 Type 2 diabetes mellitus without complications: Secondary | ICD-10-CM

## 2020-08-02 ENCOUNTER — Other Ambulatory Visit: Payer: BC Managed Care – PPO

## 2020-08-02 ENCOUNTER — Other Ambulatory Visit: Payer: Self-pay

## 2020-08-02 DIAGNOSIS — R072 Precordial pain: Secondary | ICD-10-CM

## 2020-08-02 DIAGNOSIS — E119 Type 2 diabetes mellitus without complications: Secondary | ICD-10-CM

## 2020-08-02 DIAGNOSIS — R079 Chest pain, unspecified: Secondary | ICD-10-CM

## 2020-08-02 DIAGNOSIS — E785 Hyperlipidemia, unspecified: Secondary | ICD-10-CM

## 2020-08-02 LAB — HEPATIC FUNCTION PANEL
ALT: 17 IU/L (ref 0–32)
AST: 21 IU/L (ref 0–40)
Albumin: 4.4 g/dL (ref 3.8–4.9)
Alkaline Phosphatase: 80 IU/L (ref 44–121)
Bilirubin Total: 0.3 mg/dL (ref 0.0–1.2)
Bilirubin, Direct: <0.1 mg/dL (ref 0.00–0.40)
Total Protein: 7.5 g/dL (ref 6.0–8.5)

## 2020-08-02 LAB — LIPID PANEL
Chol/HDL Ratio: 3.6 ratio (ref 0.0–4.4)
Cholesterol, Total: 170 mg/dL (ref 100–199)
HDL: 47 mg/dL (ref >39)
LDL Chol Calc (NIH): 108 mg/dL — ABNORMAL HIGH (ref 0–99)
Triglycerides: 80 mg/dL (ref 0–149)
VLDL Cholesterol Cal: 15 mg/dL (ref 5–40)

## 2020-08-03 ENCOUNTER — Other Ambulatory Visit: Payer: BC Managed Care – PPO

## 2020-08-04 ENCOUNTER — Encounter: Payer: Self-pay | Admitting: Internal Medicine

## 2020-08-04 ENCOUNTER — Other Ambulatory Visit: Payer: Self-pay

## 2020-08-04 ENCOUNTER — Ambulatory Visit: Payer: BC Managed Care – PPO | Admitting: Internal Medicine

## 2020-08-04 VITALS — BP 122/76 | HR 93 | Ht 63.0 in | Wt 190.0 lb

## 2020-08-04 DIAGNOSIS — I10 Essential (primary) hypertension: Secondary | ICD-10-CM | POA: Diagnosis not present

## 2020-08-04 DIAGNOSIS — E785 Hyperlipidemia, unspecified: Secondary | ICD-10-CM | POA: Insufficient documentation

## 2020-08-04 DIAGNOSIS — E1169 Type 2 diabetes mellitus with other specified complication: Secondary | ICD-10-CM | POA: Diagnosis not present

## 2020-08-04 DIAGNOSIS — E119 Type 2 diabetes mellitus without complications: Secondary | ICD-10-CM

## 2020-08-04 MED ORDER — ATORVASTATIN CALCIUM 40 MG PO TABS: 40.0000 mg | ORAL_TABLET | Freq: Every day | ORAL | 3 refills | Status: DC

## 2020-08-04 NOTE — Progress Notes (Signed)
Cardiology Office Note:    Date:  08/04/2020   ID:  Marilyn [name], DOB [birth year], MRN [id number]  PCP:  Deeann Saint, MD  [facility] Cardiologist:  No primary care provider on file.  [facility] Electrophysiologist:  None   Referring MD: Deeann Saint, MD   CC: Chest Pressure follow up  History of Present Illness:    Marilyn [name] is a 55 y.o. female with a hx of Diabetes (diet controlled) with Hypertension who presented with chest pain 03/30/20.  In interim of this visit, patient widely patent coronary arteries.- seen 04/11/20.  Patient notes that she is doing OK outside of Sunday and Monday.  Has had stomach issues.  Since last visit notes pain in her arm.  Relevant interval testing or therapy include CCTA.  There are no ED visit- because of a chemical burn to her right eye.  No chest pain or pressure- improved with reflux medication  No SOB/DOE and no PND/Orthopnea.  No weight gain or leg swelling.  No palpitations or syncope.  Ambulatory blood pressure  122/76.  Past Medical History:  Diagnosis Date  . Allergy   . 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/2007  . ASTHMA 01/30/2007  . Asthma   . Breast tumor    right; benign  . Chest pain 01/24/2017  . CHEST PAIN 01/29/2009   Qualifier: Diagnosis of  By: Alphonsus Sias MD, Ronnette Hila   . GERD 01/31/2007  . Hemorrhoids   . Hiatal hernia   . HTN (hypertension) 09/09/2011  . Hyperlipidemia   . Hypersomnolence 12/15/2012  . HYPOTHYROIDISM 01/30/2007  . 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 Neck pain 09/09/2011  . Obesity 04/11/2014    Past Surgical History:  Procedure Laterality Date  . BREAST BIOPSY Right 12/2014  . BREAST EXCISIONAL BIOPSY Right 2016  . BREAST LUMPECTOMY WITH RADIOACTIVE SEED LOCALIZATION Right 02/25/2015   Procedure: BREAST LUMPECTOMY WITH RADIOACTIVE SEED LOCALIZATION;  Surgeon: Ovidio Kin, MD;  Location: [facility];  Service: General;  Laterality: Right;  . BREAST REDUCTION SURGERY   1986  . BREAST SURGERY    . broken jaw    . CARPAL TUNNEL RELEASE  2001  . FRACTURE SURGERY    . REDUCTION MAMMAPLASTY Bilateral   . THYROIDECTOMY    . TUBAL LIGATION  1987   Current Medications: Current Meds  Medication Sig  . acetaminophen (TYLENOL) 500 MG tablet Take 500 mg by mouth as needed for moderate pain (arthritis pain).  [name] albuterol (VENTOLIN HFA) 108 (90 Base) MCG/ACT inhaler Inhale 2 puffs into the lungs every 4 (four) hours as needed for wheezing or shortness of breath (cough, shortness of breath or wheezing.).  [name] amLODipine (NORVASC) 10 MG tablet TAKE ONE TABLET BY MOUTH DAILY  . aspirin EC 81 MG tablet Take 1 tablet (81 mg total) by mouth daily. Swallow whole.  [name] atorvastatin (LIPITOR) 20 MG tablet Take 1 tablet (20 mg total) by mouth daily.  [name] azelastine (ASTELIN) 0.1 % nasal spray Place 1 spray into both nostrils 2 (two) times daily as needed for allergies.   . Blood Glucose Monitoring Suppl (ONETOUCH VERIO FLEX SYSTEM) w/Device KIT Use to check blood glucose daily (E11.69)  . Cholecalciferol (VITAMIN D) 125 MCG (5000 UT) CAPS Take 1 tablet by mouth daily.  . cyclobenzaprine (FLEXERIL) 5 MG tablet Take 1 tablet (5 mg total) by mouth 3 (three) times daily as needed.  [name] EPIPEN 2-PAK 0.3 MG/0.3ML SOAJ injection Inject 0.3 mLs (0.3 mg total) into  the muscle as needed for anaphylaxis.  . fexofenadine (ALLEGRA ALLERGY) 180 MG tablet Take 1 tablet (180 mg total) by mouth daily.  [name] glucose blood (ONETOUCH VERIO) test strip USE AS DIRECTED TO CHECK BLOOD SUGAR LEVELS UP TO 3 TIMES DAILY  . hydrOXYzine (ATARAX/VISTARIL) 50 MG tablet Take 1 tablet (50 mg total) by mouth 3 (three) times daily as needed.  . Lancets (ONETOUCH DELICA PLUS [id number]) MISC TEST DAILY  . levothyroxine (SYNTHROID) 100 MCG tablet TAKE ONE TABLET BY MOUTH DAILY  . meloxicam (MOBIC) 7.5 MG tablet Take 1 tablet (7.5 mg total) by mouth daily.  . methocarbamol (ROBAXIN) 500 MG tablet Take 1 tablet (500 mg total)  by mouth 2 (two) times daily.  [name] omeprazole (PRILOSEC) 40 MG capsule Take 1 capsule (40 mg total) by mouth 2 (two) times a day.  . ondansetron (ZOFRAN-ODT) 4 MG disintegrating tablet Take 1 tablet (4 mg total) by mouth every 8 (eight) hours as needed for nausea or vomiting.  . polyethylene glycol powder (GLYCOLAX/MIRALAX) powder Take 17 g by mouth daily. (Patient taking differently: Take 17 g by mouth daily as needed for mild constipation.)  . Probiotic Product (PROBIOTIC PO) Take 2 tablets by mouth daily.  . vitamin B-12 (CYANOCOBALAMIN) 500 MCG tablet Take 500 mcg by mouth daily.    Allergies:   Promethazine-dm, Blain Pais allergy], Codeine, Flonase [fluticasone propionate], Fluticasone propionate, and Promethazine   Social History   Socioeconomic History  . Marital status: Single    Spouse name: Not on file  . Number of children: 3  . Years of education: Not on file  . Highest education level: Not on file  Occupational History  . 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[employer]    Employer: [employer]  Tobacco Use  . Smoking status: Former Smoker    Packs/day: 0.25    Years: 3.00    Pack years: 0.75    Types: Cigarettes    Quit date: 06/04/1980    Years since quitting: 40.1  . Smokeless tobacco: Never Used  Vaping Use  . Vaping Use: Never used  Substance and Sexual Activity  . Alcohol use: No    Alcohol/week: 0.0 standard drinks  . Drug use: No  . Sexual activity: Not Currently    Comment: 1st intercourse 12 yo-5 partners  Other Topics Concern  . Not on file  Social History Narrative   Married   Social Determinants of Health   Financial Resource Strain: Not on file  Food Insecurity: Not on file  Transportation Needs: Not on file  Physical Activity: Not on file  Stress: Not on file  Social Connections: Not on file    Family History: The patient's family history includes Asthma in her maternal grandfather and mother; Breast cancer (age of onset: 33) in her sister; Diabetes in her  father and maternal grandmother; Heart attack in her maternal grandfather; Heart attack (age of onset: 57) in her maternal grandmother; Heart disease in her maternal grandmother; Hyperlipidemia in her father; Hypertension in her father; Stroke in her maternal grandfather and maternal grandmother; Thyroid disease in her sister. There is no history of Colon cancer.  ROS:   Please see the history of present illness.    All other systems reviewed and are negative.  EKGs/Labs/Other Studies Reviewed:    The following studies were reviewed today:  EKG:   03/30/20: SR rate 98 1st HB, similar to 03/04/20 03/04/20 sinus rhythm with first degree heart block with rate of 75  Transthoracic Echocardiogram: Date:2011  Results: Report only Left ventricle: The cavity size was normal. Systolic function was  normal. The estimated ejection fraction was in the range of 55% to  60%. Wall motion was normal; there were no regional wall motion  abnormalities.   Cardiac CT: Date: 04/11/2020 Results: IMPRESSION: 1. Coronary calcium score of 0. This was 1st percentile for age, sex, and race matched control.  2. Normal coronary origin with right dominance.  3. CAD-RADS 1. 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 Consider non-atherosclerotic causes of chest pain. Consider preventive therapy and risk factor modification.  Recent Labs: 03/04/2020: Brain Natriuretic Peptide <4; BUN 12; Creat 0.80; Hemoglobin 12.5; Platelets 255; Potassium 4.1; Sodium 139; TSH 0.52 08/02/2020: ALT 17  Recent Lipid Panel    Component Value Date/Time   CHOL 170 08/02/2020 0738   TRIG 80 08/02/2020 0738   HDL 47 08/02/2020 0738   CHOLHDL 3.6 08/02/2020 0738   CHOLHDL 4 07/17/2019 0732   VLDL 17.8 07/17/2019 0732   LDLCALC 108 (H) 08/02/2020 0738   LDLDIRECT 165.3 12/15/2012 1153   Physical Exam:    VS:  BP 122/76   Pulse 93   Ht $R'5\' 3"'LY$  (1.6 m)   Wt 190 lb (86.2 kg)   LMP 03/18/2014   SpO2 96%   BMI 33.66 kg/m      Wt Readings from Last 3 Encounters:  08/04/20 190 lb (86.2 kg)  05/06/20 184 lb (83.5 kg)  03/30/20 184 lb 12.8 oz (83.8 kg)    GEN: Well nourished, well developed in no acute distress HEENT: Normal NECK: No JVD; No carotid bruits LYMPHATICS: No lymphadenopathy CARDIAC: RRR, no murmurs, rubs, gallops RESPIRATORY:  Clear to auscultation without rales, wheezing or rhonchi  ABDOMEN: Soft, non-tender, non-distended MUSCULOSKELETAL:  No edema; No deformity  SKIN: Warm and dry NEUROLOGIC:  Alert and oriented x 3 PSYCHIATRIC:  Normal affect   ASSESSMENT:    1. Diabetes mellitus with coincident hypertension ([place])   2. Hyperlipidemia associated with type 2 diabetes mellitus ([facility])    PLAN:    In order of problems listed above:  Hypertension with Diabetes - continue home medications  - discussed diet (DASH/low sodium), and exercise/weight loss interventions   Hyperlipidemia (HLD with DM) -LDL goal less than 100 -SHARED DECISION MAKING: will increase to 40 mg atorvastatin; if having new leg aches likes before (prior to me seeing her) will decrease to old dose -Recheck lipid profile LFTs at next visit  - gave education on dietary changes  Time Spent Directly with Patient:   I have spent a total of 30 minutes with the patient reviewing hospital notes, telemetry, EKGs, labs and examining the patient as well as establishing an assessment and plan that was discussed personally with the patient.  > 50% of time was spent in direct patient care discussed statin risks and reviewed CT Scan with patient.  PRN follow up unless new symptoms or abnormal test results warranting change in plan per patient request  Would be reasonable for  Video Visit Follow up Would be reasonable for  APP Follow up   Medication Adjustments/Labs and Tests Ordered: Current medicines are reviewed at length with the patient today.  Concerns regarding medicines are outlined above.  No orders of the defined types  were placed in this encounter.  No orders of the defined types were placed in this encounter.   There are no Patient Instructions on file for this visit.   Signed, Werner Lean, MD  08/04/2020 4:07 PM    [facility]

## 2020-08-04 NOTE — Patient Instructions (Signed)
Medication Instructions:  Your physician has recommended you make the following change in your medication:    INCREASE: atorvastatin (Lipitor) to 40 mg by mouth daily *If you need a refill on your cardiac medications before your next appointment, please call your pharmacy*   Lab Work: IN 3 MONTHS: FLP and LFT If you have labs (blood work) drawn today and your tests are completely normal, you will receive your results only by: [name] MyChart Message (if you have MyChart) OR . A paper copy in the mail If you have any lab test that is abnormal or we need to change your treatment, we will call you to review the results.   Testing/Procedures: NONE   Follow-Up: Follow up as needed At [facility], you and your health needs are our priority.  As part of our continuing mission to provide you with exceptional heart care, we have created designated Provider Care Teams.  These Care Teams include your primary Cardiologist (physician) and Advanced Practice Providers (APPs -  Physician Assistants and Nurse Practitioners) who all work together to provide you with the care you need, when you need it.

## 2020-08-04 NOTE — Addendum Note (Signed)
Addended by: Precious Gilding on: 08/04/2020 04:14 PM   Modules accepted: Orders

## 2020-08-10 ENCOUNTER — Ambulatory Visit: Payer: BC Managed Care – PPO | Admitting: Family Medicine

## 2020-08-10 ENCOUNTER — Other Ambulatory Visit: Payer: Self-pay

## 2020-08-10 ENCOUNTER — Encounter: Payer: Self-pay | Admitting: Family Medicine

## 2020-08-10 VITALS — BP 118/70 | HR 94 | Temp 98.5°F | Wt 191.0 lb

## 2020-08-10 DIAGNOSIS — R1030 Lower abdominal pain, unspecified: Secondary | ICD-10-CM

## 2020-08-10 DIAGNOSIS — Z566 Other physical and mental strain related to work: Secondary | ICD-10-CM | POA: Diagnosis not present

## 2020-08-10 LAB — POC URINALSYSI DIPSTICK (AUTOMATED)
Bilirubin, UA: NEGATIVE
Glucose, UA: NEGATIVE
Ketones, UA: NEGATIVE
Leukocytes, UA: NEGATIVE
Nitrite, UA: NEGATIVE
Protein, UA: NEGATIVE
Spec Grav, UA: 1.02 (ref 1.010–1.025)
Urobilinogen, UA: 0.2 E.U./dL
pH, UA: 6 (ref 5.0–8.0)
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
## 2020-08-10 NOTE — Patient Instructions (Signed)
Textbook of family medicine (9th ed., pp. 1062-1073). [place], [place]: [employer].">  Stress, Adult Stress is a normal reaction to life events. Stress is what you feel when life demands more than you are used to, or more than you think you can handle. Some stress can be useful, such as studying for a test or meeting a deadline at work. Stress that occurs too often or for too long can cause problems. It can affect your emotional health and interfere with relationships and normal daily activities. Too much stress can weaken your body's defense system (immune system) and increase your risk for physical illness. If you already have a medical problem, stress can make it worse. What are the causes? All sorts of life events can cause stress. An event that causes stress for one person may not be stressful for another person. Major life events, whether positive or negative, commonly cause stress. Examples include:  Losing a job or starting a new job.  Losing a loved one.  Moving to a new town or home.  Getting married or divorced.  Having a baby.  Getting injured or sick. Less obvious life events can also cause stress, especially if they occur day after day or in combination with each other. Examples include:  Working long hours.  Driving in traffic.  Caring for children.  Being in debt.  Being in a difficult relationship. What are the signs or symptoms? Stress can cause emotional symptoms, including:  Anxiety. This is feeling worried, afraid, on edge, overwhelmed, or out of control.  Anger, including irritation or impatience.  Depression. This is feeling sad, down, helpless, or guilty.  Trouble focusing, remembering, or making decisions. Stress can cause physical symptoms, including:  Aches and pains. These may affect your head, neck, back, stomach, or other areas of your body.  Tight muscles or a clenched jaw.  Low energy.  Trouble sleeping. Stress can cause unhealthy  behaviors, including:  Eating to feel better (overeating) or skipping meals.  Working too much or putting off tasks.  Smoking, drinking alcohol, or using drugs to feel better. How is this diagnosed? Stress is diagnosed through an assessment by your health care provider. He or she may diagnose this condition based on:  Your symptoms and any stressful life events.  Your medical history.  Tests to rule out other causes of your symptoms. Depending on your condition, your health care provider may refer you to a specialist for further evaluation. How is this treated? Stress management techniques are the recommended treatment for stress. Medicine is not typically recommended for the treatment of stress. Techniques to reduce your reaction to stressful life events include:  Stress identification. Monitor yourself for symptoms of stress and identify what causes stress for you. These skills may help you to avoid or prepare for stressful events.  Time management. Set your priorities, keep a calendar of events, and learn to say no. Taking these actions can help you avoid making too many commitments. Techniques for coping with stress include:  Rethinking the problem. Try to think realistically about stressful events rather than ignoring them or overreacting. Try to find the positives in a stressful situation rather than focusing on the negatives.  Exercise. Physical exercise can release both physical and emotional tension. The key is to find a form of exercise that you enjoy and do it regularly.  Relaxation techniques. These relax the body and mind. The key is to find one or more that you enjoy and use the techniques regularly. Examples include: ?   Meditation, deep breathing, or progressive relaxation techniques. ? Yoga or tai chi. ? Biofeedback, mindfulness techniques, or journaling. ? Listening to music, being out in nature, or participating in other hobbies.  Practicing a healthy lifestyle.  Eat a balanced diet, drink plenty of water, limit or avoid caffeine, and get plenty of sleep.  Having a strong support network. Spend time with family, friends, or other people you enjoy being around. Express your feelings and talk things over with someone you trust. Counseling or talk therapy with a mental health professional may be helpful if you are having trouble managing stress on your own.   Follow these instructions at home: Lifestyle  Avoid drugs.  Do not use any products that contain nicotine or tobacco, such as cigarettes, e-cigarettes, and chewing tobacco. If you need help quitting, ask your health care provider.  Limit alcohol intake to no more than 1 drink a day for nonpregnant women and 2 drinks a day for men. One drink equals 12 oz of beer, 5 oz of wine, or 1 oz of hard liquor  Do not use alcohol or drugs to relax.  Eat a balanced diet that includes fresh fruits and vegetables, whole grains, lean meats, fish, eggs, and beans, and low-fat dairy. Avoid processed foods and foods high in added fat, sugar, and salt.  Exercise at least 30 minutes on 5 or more days each week.  Get 7-8 hours of sleep each night.   General instructions  Practice stress management techniques as discussed with your health care provider.  Drink enough fluid to keep your urine clear or pale yellow.  Take over-the-counter and prescription medicines only as told by your health care provider.  Keep all follow-up visits as told by your health care provider. This is important.   Contact a health care provider if:  Your symptoms get worse.  You have new symptoms.  You feel overwhelmed by your problems and can no longer manage them on your own. Get help right away if:  You have thoughts of hurting yourself or others. If you ever feel like you may hurt yourself or others, or have thoughts about taking your own life, get help right away. You can go to your nearest emergency department or  call:  Your local emergency services (911 in the [place]  A suicide crisis helpline, such as the [facility] at (931)418-4873. This is open 24 hours a day. Summary  Stress is a normal reaction to life events. It can cause problems if it happens too often or for too long.  Practicing stress management techniques is the best way to treat stress.  Counseling or talk therapy with a mental health professional may be helpful if you are having trouble managing stress on your own. This information is not intended to replace advice given to you by your health care provider. Make sure you discuss any questions you have with your health care provider. Document Revised: 02/05/2020 Document Reviewed: 02/05/2020 Elsevier Patient Education  2021 [place].  Mindfulness-Based Stress Reduction Mindfulness-based stress reduction (MBSR) is a program that helps people learn to practice mindfulness. Mindfulness is the practice of intentionally paying attention to the present moment. MBSR focuses on developing self-awareness, which allows you to respond to life stress without judgment or negative emotions. It can be learned and practiced through techniques such as education, breathing exercises, meditation, and yoga. MBSR includes several mindfulness techniques in one program. MBSR works best when you understand the treatment, are willing to try  new things, and can commit to spending time practicing what you learn. MBSR training may include learning about:  How your emotions, thoughts, and reactions affect your body.  New ways to respond to things that cause negative thoughts to start (triggers).  How to notice your thoughts and let go of them.  Practicing awareness of everyday things that you normally do without thinking.  The techniques and goals of different types of meditation. What are the benefits of MBSR? MBSR can have many benefits, which include helping you  to:  Develop self-awareness. This refers to knowing and understanding yourself.  Learn skills and attitudes that help you to participate in your own health care.  Learn new ways to care for yourself.  Be more accepting about how things are, and let things go.  Be less judgmental and approach things with an open mind.  Be patient with yourself and trust yourself more. MBSR has also been shown to:  Reduce negative emotions, such as depression and anxiety.  Improve memory and focus.  Change how you sense and approach pain.  Boost your body's ability to fight infections.  Help you connect better with other people.  Improve your sense of well-being. Follow these instructions at home:  Find a local in-person or online MBSR program.  Set aside some time regularly for mindfulness practice.  Find a mindfulness practice that works best for you. This may include one or more of the following: ? Meditation. Meditation involves focusing your mind on a certain thought or activity. ? Breathing awareness exercises. These help you to stay present by focusing on your breath. ? Body scan. For this practice, you lie down and pay attention to each part of your body from head to toe. You can identify tension and soreness and intentionally relax parts of your body. ? Yoga. Yoga involves stretching and breathing, and it can improve your ability to move and be flexible. It can also provide an experience of testing your body's limits, which can help you release stress. ? Mindful eating. This way of eating involves focusing on the taste, texture, color, and smell of each bite of food. Because this slows down eating and helps you feel full sooner, it can be an important part of a weight-loss plan.  Find a podcast or recording that provides guidance for breathing awareness, body scan, or meditation exercises. You can listen to these any time when you have a free moment to rest without  distractions.  Follow your treatment plan as told by your health care provider. This may include taking regular medicines and making changes to your diet or lifestyle as recommended.   How to practice mindfulness To do a basic awareness exercise:  Find a comfortable place to sit.  Pay attention to the present moment. Observe your thoughts, feelings, and surroundings just as they are.  Avoid placing judgment on yourself, your feelings, or your surroundings. Make note of any judgment that comes up, and let it go.  Your mind may wander, and that is okay. Make note of when your thoughts drift, and return your attention to the present moment. To do basic mindfulness meditation:  Find a comfortable place to sit. This may include a stable chair or a firm floor cushion. ? Sit upright with your back straight. Let your arms fall next to your side with your hands resting on your legs. ? If sitting in a chair, rest your feet flat on the floor. ? If sitting on a cushion, cross  your legs in front of you.  Keep your head in a neutral position with your chin dropped slightly. Relax your jaw and rest the tip of your tongue on the roof of your mouth. Drop your gaze to the floor. You can close your eyes if you like.  Breathe normally and pay attention to your breath. Feel the air moving in and out of your nose. Feel your belly expanding and relaxing with each breath.  Your mind may wander, and that is okay. Make note of when your thoughts drift, and return your attention to your breath.  Avoid placing judgment on yourself, your feelings, or your surroundings. Make note of any judgment or feelings that come up, let them go, and bring your attention back to your breath.  When you are ready, lift your gaze or open your eyes. Pay attention to how your body feels after the meditation. Where to find more information You can find more information about MBSR from:  Your health care provider.  Community-based  meditation centers or programs.  Programs offered near you. Summary  Mindfulness-based stress reduction (MBSR) is a program that teaches you how to intentionally pay attention to the present moment. It is used with other treatments to help you cope better with daily stress, emotions, and pain.  MBSR focuses on developing self-awareness, which allows you to respond to life stress without judgment or negative emotions.  MBSR programs may involve learning different mindfulness practices, such as breathing exercises, meditation, yoga, body scan, or mindful eating. Find a mindfulness practice that works best for you, and set aside time for it on a regular basis. This information is not intended to replace advice given to you by your health care provider. Make sure you discuss any questions you have with your health care provider. Document Revised: 02/05/2020 Document Reviewed: 02/05/2020 Elsevier Patient Education  2021 [place].  Irritable Bowel Syndrome, Adult  Irritable bowel syndrome (IBS) is a group of symptoms that affects the organs responsible for digestion (gastrointestinal or GI tract). IBS is not one specific disease. To regulate how the GI tract works, the body sends signals back and forth between the intestines and the brain. If you have IBS, there may be a problem with these signals. As a result, the GI tract does not function normally. The intestines may become more sensitive and overreact to certain things. This may be especially true when you eat certain foods or when you are under stress. There are four types of IBS. These may be determined based on the consistency of your stool (feces):  IBS with diarrhea.  IBS with constipation.  Mixed IBS.  Unsubtyped IBS. It is important to know which type of IBS you have. Certain treatments are more likely to be helpful for certain types of IBS. What are the causes? The exact cause of IBS is not known. What increases the  risk? You may have a higher risk for IBS if you:  Are female.  Are younger than 28.  Have a family history of IBS.  Have a mental health condition, such as depression, anxiety, or post-traumatic stress disorder.  Have had a bacterial infection of your GI tract. What are the signs or symptoms? Symptoms of IBS vary from person to person. The main symptom is abdominal pain or discomfort. Other symptoms usually include one or more of the following:  Diarrhea, constipation, or both.  Abdominal swelling or bloating.  Feeling full after eating a small or regular-sized meal.  Frequent gas.  Mucus in the stool.  A feeling of having more stool left after a bowel movement. Symptoms tend to come and go. They may be triggered by stress, mental health conditions, or certain foods. How is this diagnosed? This condition may be diagnosed based on a physical exam, your medical history, and your symptoms. You may have tests, such as:  Blood tests.  Stool test.  X-rays.  CT scan.  Colonoscopy. This is a procedure in which your GI tract is viewed with a long, thin, flexible tube. How is this treated? There is no cure for IBS, but treatment can help relieve symptoms. Treatment depends on the type of IBS you have, and may include:  Changes to your diet, such as: ? Avoiding foods that cause symptoms. ? Drinking more water. ? Following a low-FODMAP (fermentable oligosaccharides, disaccharides, monosaccharides, and polyols) diet for up to 6 weeks, or as told by your health care provider. FODMAPs are sugars that are hard for some people to digest. ? Eating more fiber. ? Eating medium-sized meals at the same times every day.  Medicines. These may include: ? Fiber supplements, if you have constipation. ? Medicine to control diarrhea (antidiarrheal medicines). ? Medicine to help control muscle tightening (spasms) in your GI tract (antispasmodic medicines). ? Medicines to help with mental  health conditions, such as antidepressants or tranquilizers.  Talk therapy or counseling.  Working with a diet and nutrition specialist (dietitian) to help create a food plan that is right for you.  Managing your stress. Follow these instructions at home: Eating and drinking  Eat a healthy diet.  Eat medium-sized meals at about the same time every day. Do not eat large meals.  Gradually eat more fiber-rich foods. These include whole grains, fruits, and vegetables. This may be especially helpful if you have IBS with constipation.  Eat a diet low in FODMAPs.  Drink enough fluid to keep your urine pale yellow.  Keep a journal of foods that seem to trigger symptoms.  Avoid foods and drinks that: ? Contain added sugar. ? Make your symptoms worse. Dairy products, caffeinated drinks, and carbonated drinks can make symptoms worse for some people. General instructions  Take over-the-counter and prescription medicines and supplements only as told by your health care provider.  Get enough exercise. Do at least 150 minutes of moderate-intensity exercise each week.  Manage your stress. Getting enough sleep and exercise can help you manage stress.  Keep all follow-up visits as told by your health care provider and therapist. This is important. Alcohol Use  Do not drink alcohol if: ? Your health care provider tells you not to drink. ? You are pregnant, may be pregnant, or are planning to become pregnant.  If you drink alcohol, limit how much you have: ? 0-1 drink a day for women. ? 0-2 drinks a day for men.  Be aware of how much alcohol is in your drink. In the [place] one drink equals one typical bottle of beer (12 oz), one-half glass of wine (5 oz), or one shot of hard liquor (1 oz). Contact a health care provider if you have:  Constant pain.  Weight loss.  Difficulty or pain when swallowing.  Diarrhea that gets worse. Get help right away if you have:  Severe abdominal  pain.  Fever.  Diarrhea with symptoms of dehydration, such as dizziness or dry mouth.  Bright red blood in your stool.  Stool that is black and tarry.  Abdominal swelling.  Vomiting that does not stop.  Blood in your vomit. Summary  Irritable bowel syndrome (IBS) is not one specific disease. It is a group of symptoms that affects digestion.  Your intestines may become more sensitive and overreact to certain things. This may be especially true when you eat certain foods or when you are under stress.  There is no cure for IBS, but treatment can help relieve symptoms. This information is not intended to replace advice given to you by your health care provider. Make sure you discuss any questions you have with your health care provider. Document Revised: 01/21/2020 Document Reviewed: 01/21/2020 Elsevier Patient Education  2021 [place] stands for fermentable oligosaccharides, disaccharides, monosaccharides, and polyols. These are sugars that are hard for some people to digest. A low-FODMAP eating plan may help some people who have irritable bowel syndrome (IBS) and certain other bowel (intestinal) diseases to manage their symptoms. This meal plan can be complicated to follow. Work with a diet and nutrition specialist (dietitian) to make a low-FODMAP eating plan that is right for you. A dietitian can help make sure that you get enough nutrition from this diet. What are tips for following this plan? Reading food labels  Check labels for hidden FODMAPs such as: ? High-fructose syrup. ? Honey. ? Agave. ? Natural fruit flavors. ? Onion or garlic powder.  Choose low-FODMAP foods that contain 3-4 grams of fiber per serving.  Check food labels for serving sizes. Eat only one serving at a time to make sure FODMAP levels stay low. Shopping  Shop with a list of foods that are recommended on this diet and make a meal plan. Meal planning  Follow a  low-FODMAP eating plan for up to 6 weeks, or as told by your health care provider or dietitian.  To follow the eating plan: 1. Eliminate high-FODMAP foods from your diet completely. Choose only low-FODMAP foods to eat. You will do this for 2-6 weeks. 2. Gradually reintroduce high-FODMAP foods into your diet one at a time. Most people should wait a few days before introducing the next new high-FODMAP food into their meal plan. Your dietitian can recommend how quickly you may reintroduce foods. 3. Keep a daily record of what and how much you eat and drink. Make note of any symptoms that you have after eating. 4. Review your daily record with a dietitian regularly to identify which foods you can eat and which foods you should avoid. General tips  Drink enough fluid each day to keep your urine pale yellow.  Avoid processed foods. These often have added sugar and may be high in FODMAPs.  Avoid most dairy products, whole grains, and sweeteners.  Work with a dietitian to make sure you get enough fiber in your diet.  Avoid high FODMAP foods at meals to manage symptoms. Recommended foods Fruits Bananas, oranges, tangerines, lemons, limes, blueberries, raspberries, strawberries, grapes, cantaloupe, honeydew melon, kiwi, papaya, passion fruit, and pineapple. Limited amounts of dried cranberries, banana chips, and shredded coconut. Vegetables Eggplant, zucchini, cucumber, peppers, green beans, bean sprouts, lettuce, arugula, kale, Swiss chard, spinach, collard greens, bok choy, summer squash, potato, and tomato. Limited amounts of corn, carrot, and sweet potato. Green parts of scallions. Grains Gluten-free grains, such as rice, oats, buckwheat, quinoa, corn, polenta, and millet. Gluten-free pasta, bread, or cereal. Rice noodles. Corn tortillas. Meats and other proteins Unseasoned beef, pork, poultry, or fish. Eggs. Berniece Salines. Tofu (firm) and tempeh. Limited amounts of nuts and seeds, such as almonds,  walnuts, [place]  nuts, pecans, peanuts, nut butters, pumpkin seeds, chia seeds, and sunflower seeds. Dairy Lactose-free milk, yogurt, and kefir. Lactose-free cottage cheese and ice cream. Non-dairy milks, such as almond, coconut, hemp, and rice milk. Non-dairy yogurt. Limited amounts of goat cheese, brie, mozzarella, parmesan, swiss, and other hard cheeses. Fats and oils Butter-free spreads. Vegetable oils, such as olive, canola, and sunflower oil. Seasoning and other foods Artificial sweeteners with names that do not end in "ol," such as aspartame, saccharine, and stevia. Maple syrup, white table sugar, raw sugar, brown sugar, and molasses. Mayonnaise, soy sauce, and tamari. Fresh basil, coriander, parsley, rosemary, and thyme. Beverages Water and mineral water. Sugar-sweetened soft drinks. Small amounts of orange juice or cranberry juice. Black and green tea. Most dry wines. Coffee. The items listed above may not be a complete list of foods and beverages you can eat. Contact a dietitian for more information. Foods to avoid Fruits Fresh, dried, and juiced forms of apple, pear, watermelon, peach, plum, cherries, apricots, blackberries, boysenberries, figs, nectarines, and mango. Avocado. Vegetables Chicory root, artichoke, asparagus, cabbage, snow peas, Brussels sprouts, broccoli, sugar snap peas, mushrooms, celery, and cauliflower. Onions, garlic, leeks, and the white part of scallions. Grains Wheat, including kamut, durum, and semolina. Barley and bulgur. Couscous. Wheat-based cereals. Wheat noodles, bread, crackers, and pastries. Meats and other proteins Fried or fatty meat. Sausage. Cashews and pistachios. Soybeans, baked beans, black beans, chickpeas, kidney beans, fava beans, navy beans, lentils, black-eyed peas, and split peas. Dairy Milk, yogurt, ice cream, and soft cheese. Cream and sour cream. Milk-based sauces. Custard. Buttermilk. Soy milk. Seasoning and other foods Any sugar-free  gum or candy. Foods that contain artificial sweeteners such as sorbitol, mannitol, isomalt, or xylitol. Foods that contain honey, high-fructose corn syrup, or agave. Bouillon, vegetable stock, beef stock, and chicken stock. Garlic and onion powder. Condiments made with onion, such as hummus, chutney, pickles, relish, salad dressing, and salsa. Tomato paste. Beverages Chicory-based drinks. Coffee substitutes. Chamomile tea. Fennel tea. Sweet or fortified wines such as port or sherry. Diet soft drinks made with isomalt, mannitol, maltitol, sorbitol, or xylitol. Apple, pear, and mango juice. Juices with high-fructose corn syrup. The items listed above may not be a complete list of foods and beverages you should avoid. Contact a dietitian for more information. Summary  FODMAP stands for fermentable oligosaccharides, disaccharides, monosaccharides, and polyols. These are sugars that are hard for some people to digest.  A low-FODMAP eating plan is a short-term diet that helps to ease symptoms of certain bowel diseases.  The eating plan usually lasts up to 6 weeks. After that, high-FODMAP foods are reintroduced gradually and one at a time. This can help you find out which foods may be causing symptoms.  A low-FODMAP eating plan can be complicated. It is best to work with a dietitian who has experience with this type of plan. This information is not intended to replace advice given to you by your health care provider. Make sure you discuss any questions you have with your health care provider. Document Revised: 10/08/2019 Document Reviewed: 10/08/2019 Elsevier Patient Education  [employer].

## 2020-08-11 LAB — CBC WITH DIFFERENTIAL/PLATELET
Basophils Absolute: 0.1 10*3/uL (ref 0.0–0.1)
Basophils Relative: 1.1 % (ref 0.0–3.0)
Eosinophils Absolute: 0.1 10*3/uL (ref 0.0–0.7)
Eosinophils Relative: 1.5 % (ref 0.0–5.0)
HCT: 38.3 % (ref 36.0–46.0)
Hemoglobin: 12.7 g/dL (ref 12.0–15.0)
Lymphocytes Relative: 36.8 % (ref 12.0–46.0)
Lymphs Abs: 1.7 10*3/uL (ref 0.7–4.0)
MCHC: 33.2 g/dL (ref 30.0–36.0)
MCV: 82 fl (ref 78.0–100.0)
Monocytes Absolute: 0.4 10*3/uL (ref 0.1–1.0)
Monocytes Relative: 8.6 % (ref 3.0–12.0)
Neutro Abs: 2.5 10*3/uL (ref 1.4–7.7)
Neutrophils Relative %: 52 % (ref 43.0–77.0)
Platelets: 252 10*3/uL (ref 150.0–400.0)
RBC: 4.68 Mil/uL (ref 3.87–5.11)
RDW: 13.4 % (ref 11.5–15.5)
WBC: 4.7 10*3/uL (ref 4.0–10.5)

## 2020-08-16 NOTE — Progress Notes (Signed)
Spoke with pt, is aware of results. 

## 2020-08-23 ENCOUNTER — Telehealth: Payer: Self-pay | Admitting: *Deleted

## 2020-08-23 DIAGNOSIS — Z006 Encounter for examination for normal comparison and control in clinical research program: Secondary | ICD-10-CM

## 2020-08-23 NOTE — Telephone Encounter (Signed)
I called patient for 90 day Identify study phone call. I left patient a message to call me back. I also e-mailed patient.

## 2020-09-10 ENCOUNTER — Other Ambulatory Visit: Payer: Self-pay | Admitting: Family Medicine

## 2020-09-23 ENCOUNTER — Encounter: Payer: Self-pay | Admitting: Family Medicine

## 2020-10-06 ENCOUNTER — Telehealth: Payer: Self-pay | Admitting: Cardiology

## 2020-10-06 DIAGNOSIS — Z006 Encounter for examination for normal comparison and control in clinical research program: Secondary | ICD-10-CM

## 2020-10-06 NOTE — Telephone Encounter (Signed)
Called patient for 90 day Identify phone call received no answer I did leave a voicemail stating the reason for calling and our department call back number.

## 2020-10-12 ENCOUNTER — Telehealth: Payer: Self-pay

## 2020-10-12 DIAGNOSIS — Z006 Encounter for examination for normal comparison and control in clinical research program: Secondary | ICD-10-CM

## 2020-10-12 NOTE — Telephone Encounter (Signed)
Called patient for 90 day Identify phone call no answer, I left a voicemail stating the intent of the phone call and our call back number to be reached in our department. 

## 2020-10-13 ENCOUNTER — Other Ambulatory Visit: Payer: Self-pay | Admitting: Family Medicine

## 2020-10-13 DIAGNOSIS — E890 Postprocedural hypothyroidism: Secondary | ICD-10-CM

## 2020-10-19 LAB — HM DIABETES EYE EXAM

## 2020-10-21 ENCOUNTER — Other Ambulatory Visit: Payer: Self-pay

## 2020-10-21 MED ORDER — OMEPRAZOLE 20 MG PO CPDR: 20.0000 mg | DELAYED_RELEASE_CAPSULE | Freq: Every day | ORAL | 2 refills | Status: DC

## 2020-10-21 NOTE — Telephone Encounter (Signed)
Rx sent in

## 2020-11-04 ENCOUNTER — Other Ambulatory Visit: Payer: Self-pay

## 2020-11-04 ENCOUNTER — Other Ambulatory Visit: Payer: BC Managed Care – PPO | Admitting: *Deleted

## 2020-11-04 DIAGNOSIS — E1169 Type 2 diabetes mellitus with other specified complication: Secondary | ICD-10-CM

## 2020-11-04 DIAGNOSIS — E785 Hyperlipidemia, unspecified: Secondary | ICD-10-CM

## 2020-11-04 LAB — HEPATIC FUNCTION PANEL
ALT: 19 IU/L (ref 0–32)
AST: 20 IU/L (ref 0–40)
Albumin: 4.5 g/dL (ref 3.8–4.9)
Alkaline Phosphatase: 73 IU/L (ref 44–121)
Bilirubin Total: 0.2 mg/dL (ref 0.0–1.2)
Bilirubin, Direct: <0.1 mg/dL (ref 0.00–0.40)
Total Protein: 7.8 g/dL (ref 6.0–8.5)

## 2020-11-04 LAB — LIPID PANEL
Chol/HDL Ratio: 4 ratio (ref 0.0–4.4)
Cholesterol, Total: 190 mg/dL (ref 100–199)
HDL: 48 mg/dL (ref >39)
LDL Chol Calc (NIH): 130 mg/dL — ABNORMAL HIGH (ref 0–99)
Triglycerides: 63 mg/dL (ref 0–149)
VLDL Cholesterol Cal: 12 mg/dL (ref 5–40)

## 2020-11-10 ENCOUNTER — Ambulatory Visit: Payer: BC Managed Care – PPO | Admitting: Nurse Practitioner

## 2020-11-10 ENCOUNTER — Encounter: Payer: Self-pay | Admitting: Nurse Practitioner

## 2020-11-10 ENCOUNTER — Telehealth: Payer: Self-pay

## 2020-11-10 ENCOUNTER — Other Ambulatory Visit: Payer: Self-pay

## 2020-11-10 ENCOUNTER — Encounter: Payer: Self-pay | Admitting: Oncology

## 2020-11-10 VITALS — BP 122/80

## 2020-11-10 DIAGNOSIS — R102 Pelvic and perineal pain: Secondary | ICD-10-CM

## 2020-11-10 DIAGNOSIS — D259 Leiomyoma of uterus, unspecified: Secondary | ICD-10-CM

## 2020-11-10 DIAGNOSIS — E785 Hyperlipidemia, unspecified: Secondary | ICD-10-CM

## 2020-11-10 MED ORDER — ATORVASTATIN CALCIUM 20 MG PO TABS: 20.0000 mg | ORAL_TABLET | Freq: Every day | ORAL | 3 refills | Status: DC

## 2020-11-10 NOTE — Telephone Encounter (Signed)
Called pt notified of results and MD recommendations.  Reports that she has not taken atorvastatin as ordered.  She was previously on atorvastatin 20 mg and barely took medication. She was then increased to 40mg  and never took this dose.  She also has not been exercising as she was previously; d/t stress.  She reports that she will start back taking atorvastatin 20 mg PO QD and work on improving diet and exercise. Appointment scheduled for 3 month fu labs.  Will route to MD as an FYI.

## 2020-11-10 NOTE — Progress Notes (Signed)
   Acute Office Visit  Subjective:    Patient ID: Marilyn [name], female    DOB: 09-12-1965, 55 y.o.   MRN: [id number]   HPI 55 y.o. presents today for pelvic pressure that started 2 days ago. She describes it as a pressure sensation mostly in left lower pelvis. 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 C/O pelvic pressure March 2021 - Ultrasound at that time showed 3 large fibroids with largest measuring 6.2 x 5.0 cm, thickened endometrium of 6.3, negative endometrial biopsy. Denies vaginal bleeding. Not sexually active.    Review of Systems  Constitutional: Negative.   Genitourinary:  Positive for pelvic pain. Negative for vaginal bleeding.      Objective:    Physical Exam Abdominal:     Tenderness: There is abdominal tenderness in the right lower quadrant and left lower quadrant. There is no guarding or rebound.  Genitourinary:    General: Normal vulva.     Vagina: Normal.     Cervix: Normal.     Uterus: Enlarged and tender.   Neurological:     Mental Status: She is alert.    BP 122/80   LMP 03/18/2014  Wt Readings from Last 3 Encounters:  08/10/20 191 lb (86.6 kg)  08/04/20 190 lb (86.2 kg)  05/06/20 184 lb (83.5 kg)        Assessment & Plan:   Problem List Items Addressed This Visit   None Visit Diagnoses     Pelvic pain in female    -  Primary   Relevant Orders   US PELVIS TRANSVAGINAL NON-OB (TV ONLY)   Uterine leiomyoma, unspecified location       Relevant Orders   US PELVIS TRANSVAGINAL NON-OB (TV ONLY)      Plan: We discussed her history of uterine fibroids and management options for these. Difficult to differentiate between musculoskeletal injury versus symptomatic fibroids. Provided her with the option to monitor symptoms for 1-2 weeks and if improved then it is likely musculoskeletal or we can proceed with ultrasound now. She would like to proceed with ultrasound.      Tamela Gammon DNP, 11:36 AM  11/10/2020

## 2020-11-10 NOTE — Telephone Encounter (Signed)
-----   Message from Werner Lean, MD sent at 11/06/2020 10:27 AM EDT ----- Results: Worsened cholesterol Plan: Start Zetia 10 mg PO Daily and check lipids and LFTs in three months Clarify statin dose  Werner Lean, MD

## 2020-11-17 ENCOUNTER — Other Ambulatory Visit: Payer: Self-pay | Admitting: Family Medicine

## 2020-11-17 DIAGNOSIS — I10 Essential (primary) hypertension: Secondary | ICD-10-CM

## 2020-11-22 ENCOUNTER — Other Ambulatory Visit: Payer: Self-pay | Admitting: Nurse Practitioner

## 2020-11-22 ENCOUNTER — Ambulatory Visit (INDEPENDENT_AMBULATORY_CARE_PROVIDER_SITE_OTHER): Payer: BC Managed Care – PPO

## 2020-11-22 ENCOUNTER — Other Ambulatory Visit: Payer: Self-pay

## 2020-11-22 ENCOUNTER — Ambulatory Visit: Payer: BC Managed Care – PPO | Admitting: Obstetrics and Gynecology

## 2020-11-22 ENCOUNTER — Encounter: Payer: Self-pay | Admitting: Obstetrics and Gynecology

## 2020-11-22 VITALS — BP 110/74 | HR 84 | Ht 63.0 in

## 2020-11-22 DIAGNOSIS — R102 Pelvic and perineal pain: Secondary | ICD-10-CM | POA: Diagnosis not present

## 2020-11-22 DIAGNOSIS — D259 Leiomyoma of uterus, unspecified: Secondary | ICD-10-CM

## 2020-11-22 NOTE — Progress Notes (Signed)
GYNECOLOGY  VISIT   HPI: 55 y.o.   Single  African American  female   [phone] with Patient's last menstrual period was 03/18/2014.   here for pelvic ultrasound for pelvic pain.  She has known fibroids.   The night after her last day in school, she developed pelvic pain.  She had been moving furniture.  The pain was localized in her left labia.  She feels pressure with bending and walking.  It feels like a poking pain.   Naproxen does help somewhat.   She had spotting in 2020.  She had a benign endometrial biopsy with Dr. Delilah Shan 08/20/19.  No bleeding since then.   No dysuria.   States she has a prescription for Flexeril 5 mg at home.   Not sexually active since about 2015.   GYNECOLOGIC HISTORY: Patient's last menstrual period was 03/18/2014. Contraception: Tubal/PMP Menopausal hormone therapy: none Last mammogram: 06-30-20 Neg/BiRads1 Last pap smear: 08-04-29 Neg, 02-20-17 Neg, 05-12-13 Neg        OB History     Gravida  4   Para  3   Term      Preterm      AB  1   Living  3      SAB      IAB  1   Ectopic      Multiple      Live Births                 Patient Active Problem List   Diagnosis Date Noted   Hyperlipidemia associated with type 2 diabetes mellitus ([place]) 08/04/2020   Diet-controlled type 2 diabetes mellitus ([place]) 07/20/2019   Swollen neck 04/09/2018   Urinary retention 08/05/2017   Atypical chest pain 01/23/2017   Nausea & vomiting 07/16/2015   RUQ abdominal pain 07/16/2015   RLQ abdominal pain 07/15/2015   Pain in lower back 11/30/2014   Acute upper respiratory infection 05/05/2014   Allergic rhinitis 05/05/2014   Hoarseness 05/05/2014   Dysphagia 05/05/2014   Obesity 04/11/2014   Edema, peripheral 04/11/2014   Cough 12/20/2012   Hypersomnolence 12/15/2012   DOE (dyspnea on exertion) 12/15/2012   Right shoulder pain 12/15/2012   Abdominal pain 11/15/2011   Diabetes mellitus with coincident hypertension ([place]) 09/09/2011    Neck pain 09/09/2011   Preventative health care 04/30/2011   HOT FLASHES 12/22/2009   CHEST PAIN 01/29/2009   Acute non-recurrent maxillary sinusitis 09/25/2008   HYPERTHYROIDISM 01/31/2007   GERD 01/31/2007   Hypothyroidism 01/30/2007   Hyperlipidemia 01/30/2007   Iron deficiency anemia 01/30/2007   ASTHMA 01/30/2007    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   Neck pain 09/09/2011   Obesity 04/11/2014    Past Surgical History:  Procedure Laterality Date   BREAST BIOPSY Right 12/2014   BREAST EXCISIONAL BIOPSY Right 2016   BREAST LUMPECTOMY WITH RADIOACTIVE SEED LOCALIZATION Right 02/25/2015   Procedure: BREAST LUMPECTOMY WITH RADIOACTIVE SEED LOCALIZATION;  Surgeon: Alphonsa Overall, MD;  Location: [facility];  Service: General;  Laterality: Right;   BREAST REDUCTION SURGERY  1986   BREAST SURGERY     broken jaw     CARPAL TUNNEL RELEASE  2001   FRACTURE SURGERY     REDUCTION MAMMAPLASTY Bilateral    THYROIDECTOMY     TUBAL LIGATION  1987    Current Outpatient Medications  Medication Sig Dispense Refill   acetaminophen (TYLENOL) 500 MG tablet Take 500 mg by mouth as needed for moderate pain (arthritis pain).     albuterol (VENTOLIN HFA) 108 (90 Base) MCG/ACT inhaler Inhale 2 puffs into the lungs every 4 (four) hours as needed for wheezing or shortness of breath (cough, shortness of breath or wheezing.). 6.7 g 1   amLODipine (NORVASC) 10 MG tablet TAKE ONE TABLET BY MOUTH DAILY 90 tablet 1   aspirin EC 81 MG tablet Take 1 tablet (81 mg total) by mouth daily. Swallow  whole. 90 tablet 3   atorvastatin (LIPITOR) 20 MG tablet Take 1 tablet (20 mg total) by mouth daily. 90 tablet 3   Blood Glucose Monitoring Suppl (ONETOUCH VERIO FLEX SYSTEM) w/Device KIT Use to check blood glucose daily (E11.69) 1 kit 0   Cholecalciferol (VITAMIN D) 125 MCG (5000 UT) CAPS Take 1 tablet by mouth daily.     cyclobenzaprine (FLEXERIL) 5 MG tablet Take 1 tablet (5 mg total) by mouth 3 (three) times daily as needed. 90 tablet 1   EPIPEN 2-PAK 0.3 MG/0.3ML SOAJ injection Inject 0.3 mLs (0.3 mg total) into the muscle as needed for anaphylaxis. 1 each 0   fexofenadine (ALLEGRA ALLERGY) 180 MG tablet Take 1 tablet (180 mg total) by mouth daily. 30 tablet 3   glucose blood (ONETOUCH VERIO) test strip USE AS DIRECTED TO CHECK BLOOD SUGAR LEVELS UP TO 3 TIMES DAILY 100 strip 1   hydrOXYzine (ATARAX/VISTARIL) 50 MG tablet Take 1 tablet (50 mg total) by mouth 3 (three) times daily as needed. 30 tablet 0   Lancets (ONETOUCH DELICA PLUS YHCWCB76E) MISC USE TO TEST DAILY 100 each 0   levothyroxine (SYNTHROID) 100 MCG tablet TAKE ONE TABLET BY MOUTH DAILY 90 tablet 0   omeprazole (PRILOSEC) 40 MG capsule Take 1 capsule (40 mg total) by mouth 2 (two) times a day. 60 capsule 11   ondansetron (ZOFRAN-ODT) 4 MG disintegrating tablet Take 1 tablet (4 mg total) by mouth every 8 (eight) hours as needed for nausea or vomiting. 8 tablet 0   polyethylene glycol powder (GLYCOLAX/MIRALAX) powder Take 17 g by mouth daily. 500 g 1   Probiotic Product (PROBIOTIC PO) Take 2 tablets by mouth daily.     vitamin B-12 (CYANOCOBALAMIN) 500 MCG tablet Take 500 mcg by mouth daily.     No current facility-administered medications for this visit.     ALLERGIES: Promethazine-dm, Blain Pais allergy], Codeine, Fluticasone propionate, Promethazine, and Flonase [fluticasone propionate]  Family History  Problem Relation Age of Onset   Hypertension Father    Diabetes Father    Hyperlipidemia Father    Asthma Mother     Breast cancer Sister 55   Thyroid disease Sister    Diabetes Maternal Grandmother    Heart disease Maternal Grandmother    Heart attack Maternal Grandmother 60   Stroke Maternal Grandmother    Asthma Maternal Grandfather    Heart attack Maternal Grandfather    Stroke Maternal Grandfather    Colon cancer Neg Hx     Social History   Socioeconomic History   Marital status:  Single    Spouse name: Not on file   Number of children: 3   Years of education: Not on file   Highest education level: Not on file  Occupational History   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[employer]    Employer: [employer]  Tobacco Use   Smoking status: Former    Packs/day: 0.25    Years: 3.00    Pack years: 0.75    Types: Cigarettes    Quit date: 06/04/1980    Years since quitting: 40.4   Smokeless tobacco: Never  Vaping Use   Vaping Use: Never used  Substance and Sexual Activity   Alcohol use: No    Alcohol/week: 0.0 standard drinks   Drug use: No   Sexual activity: Not Currently    Comment: 1st intercourse 12 yo-5 partners  Other Topics Concern   Not on file  Social History Narrative   Married   Social Determinants of Health   Financial Resource Strain: Not on file  Food Insecurity: Not on file  Transportation Needs: Not on file  Physical Activity: Not on file  Stress: Not on file  Social Connections: Not on file  Intimate Partner Violence: Not on file    Review of Systems  All other systems reviewed and are negative.  PHYSICAL EXAMINATION:    BP 110/74 (Cuff Size: Large)   Pulse 84   Ht '5\' 3"'  (1.6 m)   LMP 03/18/2014   SpO2 97%   BMI 33.83 kg/m     General appearance: alert, cooperative and appears stated age.  Pelvic: External genitalia:  no lesions.  Tender left labia majora with no mass or lesion.              Urethra:  normal appearing urethra with no masses, tenderness or lesions              Bartholins and Skenes: normal                 Vagina: normal appearing vagina with  normal color and discharge, no lesions              Cervix: no lesions                Bimanual Exam:  Uterus:  normal size, contour, position, consistency, mobility.  Tender anterior vaginal wall.               Adnexa: no mass, fullness, tenderness             Pelvic US Uterus with 3 fibroids - 5.63 cm, 4.00 cm, 1.89 cm.  They are smaller in size compared to prior pelvic US. EMS 4.95 mm. Normal ovaries.  No free fluid.  Normal perfusion to ovaries.   Chaperone was present for exam.  ASSESSMENT  Pelvic pain.  I suspect musculoskeletal in origin.  Hernia less likely.  Fibroids.   PLAN  Pelvic US findings and report reviewed with patient.  I recommend trying her Rx or Flexeril and resting to see if the pain improves.  No treatment needed for fibroids as these are chronic and are not likely the cause of her pain.  Check Urine with reflex culture.  If pain persists, consider pelvic CT to evaluate for hernia formation.  Fu prn.    28 min total time was spent for this patient encounter, including preparation, face-to-face counseling with the patient, coordination of care, and documentation of the encounter.

## 2020-11-23 ENCOUNTER — Other Ambulatory Visit: Payer: Self-pay | Admitting: Family Medicine

## 2020-11-23 DIAGNOSIS — E119 Type 2 diabetes mellitus without complications: Secondary | ICD-10-CM

## 2020-11-24 LAB — URINALYSIS, COMPLETE W/RFL CULTURE
Bacteria, UA: NONE SEEN /HPF
Bilirubin Urine: NEGATIVE
Glucose, UA: NEGATIVE
Hyaline Cast: NONE SEEN /LPF
Ketones, ur: NEGATIVE
Leukocyte Esterase: NEGATIVE
Nitrites, Initial: NEGATIVE
Protein, ur: NEGATIVE
Specific Gravity, Urine: 1.015 (ref 1.001–1.035)
pH: 5.5 (ref 5.0–8.0)

## 2020-11-24 LAB — CULTURE INDICATED

## 2020-11-24 LAB — URINE CULTURE
MICRO NUMBER:: 12032052
Result:: NO GROWTH
SPECIMEN QUALITY:: ADEQUATE

## 2020-12-14 IMAGING — CR DG CHEST 2V
2 series · 2 of 2 positions shown · non-contrast
Comparison: Chest radiographs 03/01/2018 and earlier.

CLINICAL DATA: 52-year-old female with chest pain radiating to the
left arm and jaw.

EXAM:
CHEST - 2 VIEW

[w chest pa]
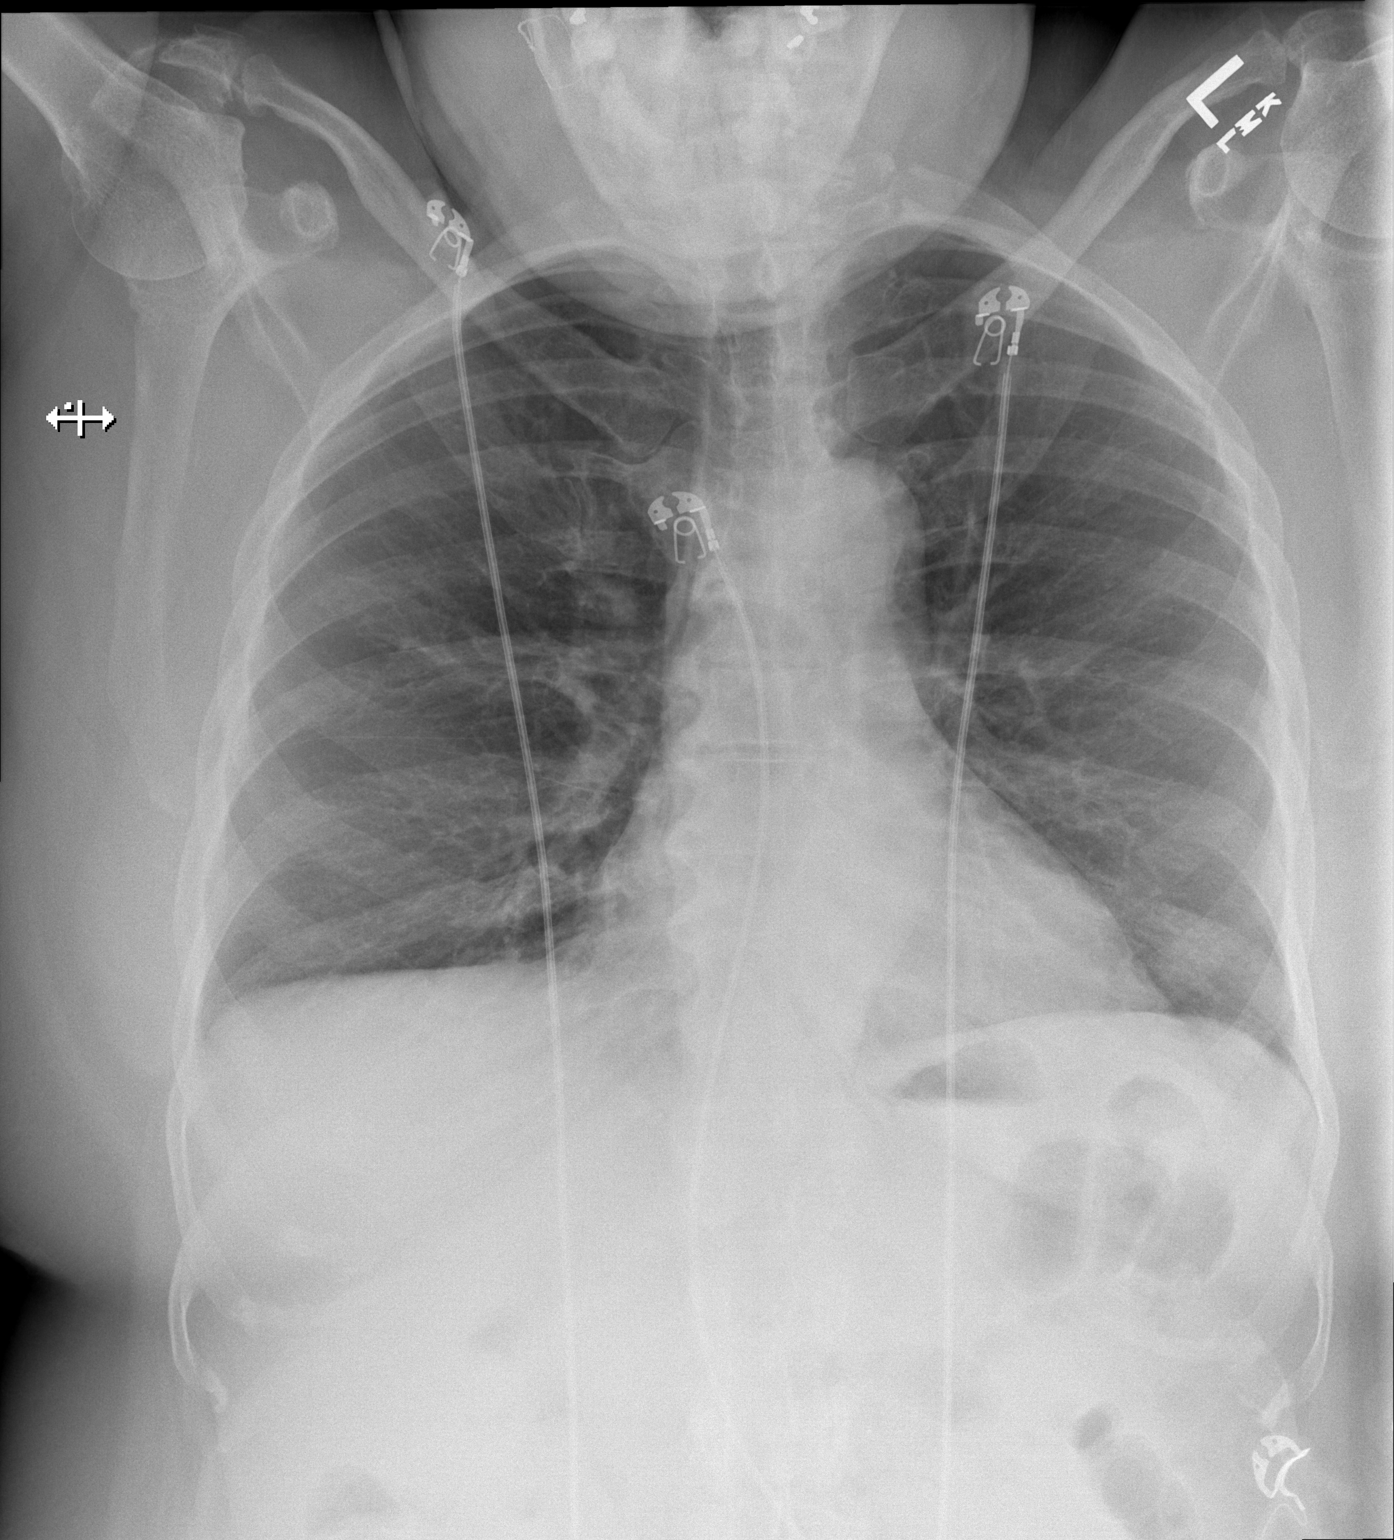

[w chest lat]
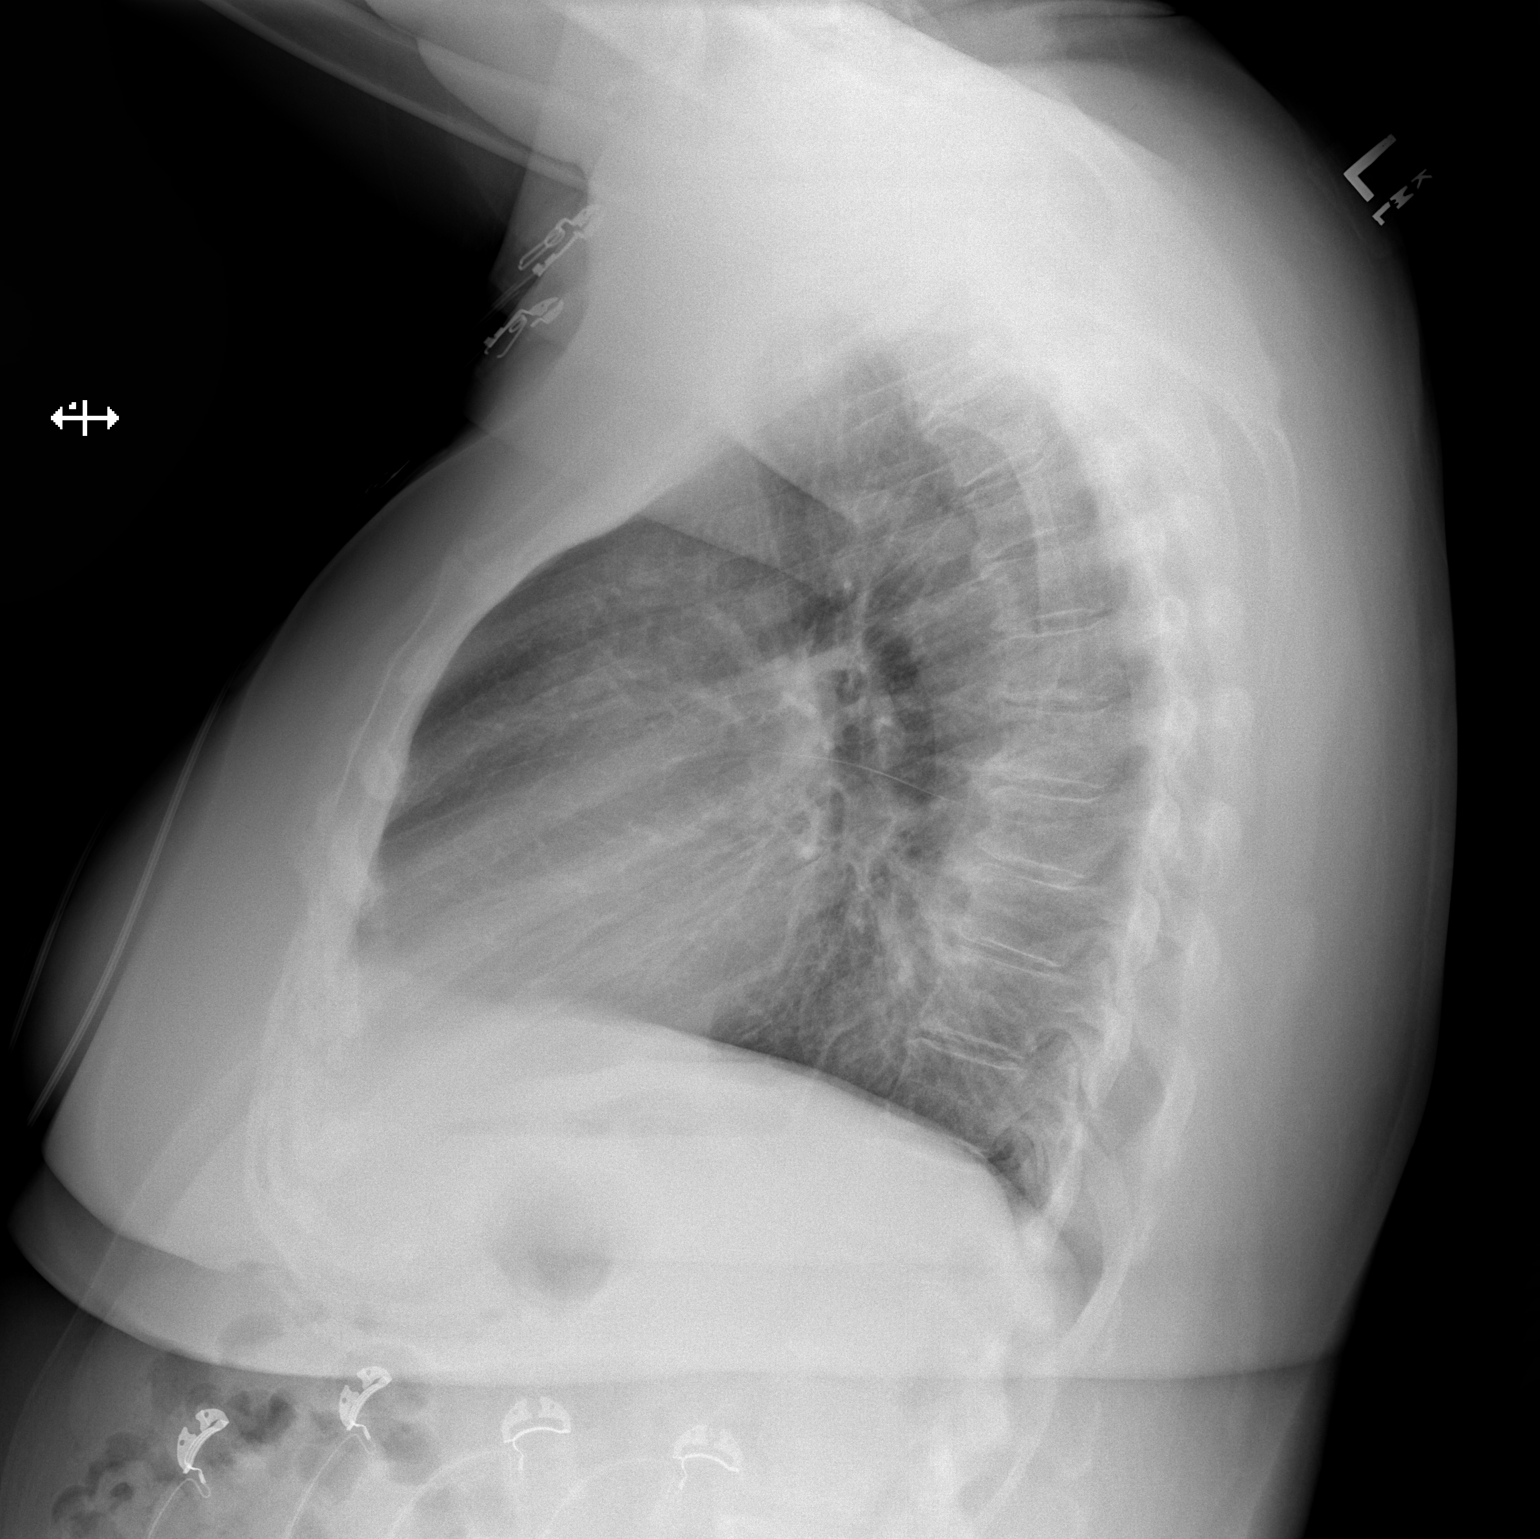

[2 of 2 positions shown; findings below may reference images not displayed]

FINDINGS: Stable somewhat low lung volumes. Stable mediastinal contours with
mild tortuosity of the thoracic aorta. Visualized tracheal air
column is within normal limits. No pneumothorax, pulmonary edema,
pleural effusion or confluent pulmonary opacity. No acute osseous
abnormality identified. Negative visible bowel gas pattern.
IMPRESSION: No acute cardiopulmonary abnormality.

## 2020-12-15 ENCOUNTER — Inpatient Hospital Stay: Admit: 2020-12-15 | Payer: BC Managed Care – PPO

## 2021-01-13 ENCOUNTER — Other Ambulatory Visit: Payer: Self-pay | Admitting: Family Medicine

## 2021-01-13 DIAGNOSIS — E890 Postprocedural hypothyroidism: Secondary | ICD-10-CM

## 2021-02-07 ENCOUNTER — Other Ambulatory Visit: Payer: Self-pay | Admitting: Family Medicine

## 2021-02-10 ENCOUNTER — Other Ambulatory Visit: Payer: BC Managed Care – PPO

## 2021-02-20 ENCOUNTER — Other Ambulatory Visit: Payer: BC Managed Care – PPO | Admitting: *Deleted

## 2021-02-20 ENCOUNTER — Other Ambulatory Visit: Payer: Self-pay

## 2021-02-20 DIAGNOSIS — E1169 Type 2 diabetes mellitus with other specified complication: Secondary | ICD-10-CM

## 2021-02-20 DIAGNOSIS — E785 Hyperlipidemia, unspecified: Secondary | ICD-10-CM

## 2021-02-20 LAB — HEPATIC FUNCTION PANEL
ALT: 24 IU/L (ref 0–32)
AST: 30 IU/L (ref 0–40)
Albumin: 4.6 g/dL (ref 3.8–4.9)
Alkaline Phosphatase: 76 IU/L (ref 44–121)
Bilirubin Total: 0.2 mg/dL (ref 0.0–1.2)
Bilirubin, Direct: <0.1 mg/dL (ref 0.00–0.40)
Total Protein: 7.2 g/dL (ref 6.0–8.5)

## 2021-02-20 LAB — LIPID PANEL
Chol/HDL Ratio: 3.4 ratio (ref 0.0–4.4)
Cholesterol, Total: 157 mg/dL (ref 100–199)
HDL: 46 mg/dL (ref >39)
LDL Chol Calc (NIH): 98 mg/dL (ref 0–99)
Triglycerides: 65 mg/dL (ref 0–149)
VLDL Cholesterol Cal: 13 mg/dL (ref 5–40)

## 2021-02-21 ENCOUNTER — Encounter: Payer: Self-pay | Admitting: *Deleted

## 2021-02-21 ENCOUNTER — Telehealth: Payer: Self-pay | Admitting: Family Medicine

## 2021-02-21 ENCOUNTER — Telehealth: Payer: Self-pay | Admitting: *Deleted

## 2021-02-21 ENCOUNTER — Encounter: Payer: Self-pay | Admitting: Obstetrics and Gynecology

## 2021-02-21 NOTE — Telephone Encounter (Signed)
Spoke with patient, would like to schedule an appointment to discuss pelvic pain. Patient requesting after 4pm appointment due to her work schedule. Provider does not have any open slots this week. I will ask Provider is there a possibility of double booking so patient can be seen. Pt is not available this Thursday any other day will be sufficient.  Last OV 11/22/2020 Primary Dx Pelvic pain

## 2021-02-21 NOTE — Telephone Encounter (Signed)
Called patient to discuss her my chart message more in detail.

## 2021-02-21 NOTE — Telephone Encounter (Signed)
I have reviewed the schedule and I can see possibility for 1:15 this Friday, September 23.

## 2021-02-21 NOTE — Telephone Encounter (Signed)
Unable to reach patient. Left voice mail.My chart message sent to patient to make an appointment to discuss pain.

## 2021-02-21 NOTE — Telephone Encounter (Signed)
PT called to see if it is possible for her to get something called in for the ear ache and pain she is having like a ear drop. Please advise as she would like to talk to [facility] nurse about this.

## 2021-02-21 NOTE — Telephone Encounter (Signed)
Patient called because she needs ear drops for ear ache she is having. Has tried other ear drops but nothing is working. Would like a prescription     Good callback number is [phone]   Please Advise

## 2021-02-22 NOTE — Telephone Encounter (Signed)
I called patient and left detailed message that Dr. Quincy Simmonds reviewed her schedule and can see her on Friday 02/24/21 at 1:15pm.  I asked her to call back and let us know if she would like to be scheduled for this.

## 2021-02-23 NOTE — Telephone Encounter (Signed)
Sounds like pt would benefit from an appt.

## 2021-02-23 NOTE — Telephone Encounter (Signed)
Called patient again and left message that Dr. Quincy Simmonds can see her 1:15pm tomorrow but I need to know if she would like that appointment so I can scheduled appt for her at that time. Asked her to please call and let me know.

## 2021-02-27 NOTE — Telephone Encounter (Signed)
Per pcp, pt needs an appointment, informed pt, and she still asked if something could be sent in.

## 2021-03-17 ENCOUNTER — Ambulatory Visit: Payer: BC Managed Care – PPO | Admitting: Family Medicine

## 2021-03-17 ENCOUNTER — Other Ambulatory Visit: Payer: Self-pay

## 2021-03-17 ENCOUNTER — Telehealth: Payer: Self-pay | Admitting: Family Medicine

## 2021-03-17 VITALS — BP 150/80 | HR 106 | Temp 98.3°F | Wt 194.3 lb

## 2021-03-17 DIAGNOSIS — J019 Acute sinusitis, unspecified: Secondary | ICD-10-CM | POA: Diagnosis not present

## 2021-03-17 DIAGNOSIS — R6889 Other general symptoms and signs: Secondary | ICD-10-CM | POA: Diagnosis not present

## 2021-03-17 LAB — POC INFLUENZA A&B (BINAX/QUICKVUE): Influenza A, POC: NEGATIVE

## 2021-03-17 MED ORDER — AMOXICILLIN-POT CLAVULANATE 875-125 MG PO TABS: 1.0000 | ORAL_TABLET | Freq: Two times a day (BID) | ORAL | 0 refills | Status: DC

## 2021-03-17 NOTE — Telephone Encounter (Signed)
Called pt, no answer. Will try calling again on Monday.

## 2021-03-17 NOTE — Progress Notes (Signed)
Established Patient Office Visit  Subjective:  Patient ID: Marilyn [name], female    DOB: [birth year]  Age: 55 y.o. MRN: [id number]  CC:  Chief Complaint  Patient presents with   sinus pressure    Sinus pressure, ear pressure, slight cough, exposed to the flu.     HPI Marilyn [name] presents for onset of upper respiratory symptoms over a week ago.  Onset about 9 days ago.  She has had sinus pressure.  Some bloody nasal discharge.  Bilateral ear pressure.  Intermittent headaches.  Some cough.  Exposed to a couple of children at her school who reportedly had flu.  Patient denies any major body aches.  Home COVID testing negative.  No dyspnea.  No nausea or vomiting.  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   Neck pain 09/09/2011   Obesity 04/11/2014    Past Surgical History:  Procedure Laterality Date   BREAST BIOPSY Right 12/2014   BREAST EXCISIONAL BIOPSY Right 2016   BREAST LUMPECTOMY WITH RADIOACTIVE SEED LOCALIZATION Right 02/25/2015   Procedure: BREAST LUMPECTOMY WITH RADIOACTIVE SEED LOCALIZATION;  Surgeon: Alphonsa Overall, MD;  Location: [place];  Service: General;  Laterality: Right;   BREAST REDUCTION SURGERY  1986   BREAST SURGERY     broken jaw     CARPAL TUNNEL RELEASE  2001   FRACTURE SURGERY     REDUCTION MAMMAPLASTY Bilateral    THYROIDECTOMY     TUBAL LIGATION  1987    Family History  Problem Relation Age of Onset   Hypertension Father    Diabetes Father    Hyperlipidemia Father    Asthma Mother    Breast cancer Sister 53   Thyroid  disease Sister    Diabetes Maternal Grandmother    Heart disease Maternal Grandmother    Heart attack Maternal Grandmother 60   Stroke Maternal Grandmother    Asthma Maternal Grandfather    Heart attack Maternal Grandfather    Stroke Maternal Grandfather    Colon cancer Neg Hx     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[employer]    Employer: [employer]  Tobacco Use   Smoking status: Former    Packs/day: 0.25    Years: 3.00    Pack years: 0.75    Types: Cigarettes    Quit date: 06/04/1980    Years since quitting: 40.8   Smokeless tobacco: Never  Vaping Use   Vaping Use: Never used  Substance and Sexual Activity   Alcohol use: No    Alcohol/week: 0.0 standard drinks   Drug use: No   Sexual activity: Not Currently    Comment: 1st intercourse 12 yo-5 partners  Other Topics Concern   Not on file  Social History  Narrative   Married   Social Determinants of Radio broadcast assistant Strain: Not on file  Food Insecurity: Not on file  Transportation Needs: Not on file  Physical Activity: Not on file  Stress: Not on file  Social Connections: Not on file  Intimate Partner Violence: Not on file    Outpatient Medications Prior to Visit  Medication Sig Dispense Refill   acetaminophen (TYLENOL) 500 MG tablet Take 500 mg by mouth as needed for moderate pain (arthritis pain).     albuterol (VENTOLIN HFA) 108 (90 Base) MCG/ACT inhaler Inhale 2 puffs into the lungs every 4 (four) hours as needed for wheezing or shortness of breath (cough, shortness of breath or wheezing.). 6.7 g 1   amLODipine (NORVASC) 10 MG tablet TAKE ONE TABLET BY MOUTH DAILY 90 tablet 1   aspirin EC 81 MG tablet Take 1 tablet (81 mg total) by mouth daily. Swallow whole. 90 tablet 3   atorvastatin (LIPITOR)  20 MG tablet Take 1 tablet (20 mg total) by mouth daily. 90 tablet 3   Blood Glucose Monitoring Suppl (ONETOUCH VERIO FLEX SYSTEM) w/Device KIT Use to check blood glucose daily (E11.69) 1 kit 0   Cholecalciferol (VITAMIN D) 125 MCG (5000 UT) CAPS Take 1 tablet by mouth daily.     cyclobenzaprine (FLEXERIL) 5 MG tablet Take 1 tablet (5 mg total) by mouth 3 (three) times daily as needed. 90 tablet 1   EPIPEN 2-PAK 0.3 MG/0.3ML SOAJ injection Inject 0.3 mLs (0.3 mg total) into the muscle as needed for anaphylaxis. 1 each 0   fexofenadine (ALLEGRA ALLERGY) 180 MG tablet Take 1 tablet (180 mg total) by mouth daily. 30 tablet 3   glucose blood (ONETOUCH VERIO) test strip USE AS DIRECTED TO CHECK BLOOD SUGAR LEVELS UP TO THREE TIMES A DAY 100 strip 1   hydrOXYzine (ATARAX/VISTARIL) 50 MG tablet Take 1 tablet (50 mg total) by mouth 3 (three) times daily as needed. 30 tablet 0   Lancets (ONETOUCH DELICA PLUS UQJFHL45G) MISC USE TO TEST ONCE DAILY 100 each 2   levothyroxine (SYNTHROID) 100 MCG tablet TAKE ONE TABLET BY MOUTH DAILY 90 tablet 1   omeprazole (PRILOSEC) 40 MG capsule Take 1 capsule (40 mg total) by mouth 2 (two) times a day. 60 capsule 11   ondansetron (ZOFRAN-ODT) 4 MG disintegrating tablet Take 1 tablet (4 mg total) by mouth every 8 (eight) hours as needed for nausea or vomiting. 8 tablet 0   polyethylene glycol powder (GLYCOLAX/MIRALAX) powder Take 17 g by mouth daily. 500 g 1   Probiotic Product (PROBIOTIC PO) Take 2 tablets by mouth daily.     vitamin B-12 (CYANOCOBALAMIN) 500 MCG tablet Take 500 mcg by mouth daily.     No facility-administered medications prior to visit.    Allergies  Allergen Reactions   Promethazine-Dm    Blain Pais Allergy] Anaphylaxis   Codeine Nausea And Vomiting   Fluticasone Propionate Other (See Comments)   Promethazine Other (See Comments)   Flonase [Fluticasone Propionate] Other (See Comments)    Nose bleeds    ROS Review of Systems  Constitutional:   Positive for fatigue. Negative for chills and fever.  HENT:  Positive for congestion, sinus pressure and sinus pain. Negative for sore throat.   Respiratory:  Positive for cough. Negative for shortness of breath and wheezing.      Objective:    Physical Exam Vitals reviewed.  HENT:     Right Ear: Tympanic membrane normal.  Left Ear: Tympanic membrane normal.  Cardiovascular:     Rate and Rhythm: Normal rate and regular rhythm.  Pulmonary:     Effort: Pulmonary effort is normal.     Breath sounds: Normal breath sounds. No wheezing or rales.  Musculoskeletal:     Cervical back: Neck supple.  Lymphadenopathy:     Cervical: No cervical adenopathy.  Neurological:     Mental Status: She is alert.    BP (!) 150/80 (BP Location: Left Arm, Patient Position: Sitting, Cuff Size: Normal)   Pulse (!) 106   Temp 98.3 F (36.8 C) (Oral)   Wt 194 lb 4.8 oz (88.1 kg)   LMP 03/18/2014   SpO2 96%   BMI 34.42 kg/m  Wt Readings from Last 3 Encounters:  03/17/21 194 lb 4.8 oz (88.1 kg)  08/10/20 191 lb (86.6 kg)  08/04/20 190 lb (86.2 kg)     Health Maintenance Due  Topic Date Due   COVID-19 Vaccine (1) Never done   Hepatitis C Screening  Never done   Zoster Vaccines- Shingrix (1 of 2) Never done   TETANUS/TDAP  12/23/2019   PAP SMEAR-Modifier  02/21/2020   HEMOGLOBIN A1C  07/01/2020   FOOT EXAM  10/28/2020   URINE MICROALBUMIN  10/28/2020   COLONOSCOPY (Pts 45-61yr Insurance coverage will need to be confirmed)  12/24/2020   INFLUENZA VACCINE  01/02/2021    There are no preventive care reminders to display for this patient.  Lab Results  Component Value Date   TSH 0.52 03/04/2020   Lab Results  Component Value Date   WBC 4.7 08/10/2020   HGB 12.7 08/10/2020   HCT 38.3 08/10/2020   MCV 82.0 08/10/2020   PLT 252.0 08/10/2020   Lab Results  Component Value Date   NA 139 03/04/2020   K 4.1 03/04/2020   CHLORIDE 105 06/03/2015   CO2 28 03/04/2020   GLUCOSE 87  03/04/2020   BUN 12 03/04/2020   CREATININE 0.80 03/04/2020   BILITOT 0.2 02/20/2021   ALKPHOS 76 02/20/2021   AST 30 02/20/2021   ALT 24 02/20/2021   PROT 7.2 02/20/2021   ALBUMIN 4.6 02/20/2021   CALCIUM 9.1 03/04/2020   ANIONGAP 10 07/30/2019   EGFR 87 (L) 06/03/2015   GFR 80.54 07/17/2019   Lab Results  Component Value Date   CHOL 157 02/20/2021   Lab Results  Component Value Date   HDL 46 02/20/2021   Lab Results  Component Value Date   LDLCALC 98 02/20/2021   Lab Results  Component Value Date   TRIG 65 02/20/2021   Lab Results  Component Value Date   CHOLHDL 3.4 02/20/2021   Lab Results  Component Value Date   HGBA1C 5.6 12/30/2019      Assessment & Plan:   Upper respiratory symptoms.  Flu screen negative.  May all be viral but she is having increasing sinus pressure about 9 days into illness with some bloody discharge.  -We elected to go and cover with Augmentin 875 mg twice daily with food -Stay well-hydrated -Over-the-counter plain Mucinex twice daily -Follow-up for any persistent or worsening symptoms.   Meds ordered this encounter  Medications   amoxicillin-clavulanate (AUGMENTIN) 875-125 MG tablet    Sig: Take 1 tablet by mouth 2 (two) times daily.    Dispense:  14 tablet    Refill:  0    Follow-up: No follow-ups on file.    BCarolann Littler MD

## 2021-03-17 NOTE — Telephone Encounter (Signed)
Patient is requesting a phone call from [place].  Patient could be contacted at [phone].  Please advise.

## 2021-03-20 NOTE — Telephone Encounter (Signed)
Patient called to speak with Marilyn [name].    Good callback number is [phone]

## 2021-03-20 NOTE — Telephone Encounter (Signed)
Called pt, no answer, left VM to call office.

## 2021-03-22 NOTE — Telephone Encounter (Signed)
Spoke with pt, scheduled an appointment, patient states her flu like symptoms are not getting any better. Informed her after she feels better she can schedule her CPE.

## 2021-03-23 ENCOUNTER — Encounter: Payer: Self-pay | Admitting: Family Medicine

## 2021-03-23 ENCOUNTER — Other Ambulatory Visit: Payer: Self-pay

## 2021-03-23 ENCOUNTER — Ambulatory Visit: Payer: BC Managed Care – PPO | Admitting: Family Medicine

## 2021-03-23 VITALS — BP 126/84 | HR 106 | Temp 98.5°F | Ht 63.0 in | Wt 192.8 lb

## 2021-03-23 DIAGNOSIS — J0140 Acute pansinusitis, unspecified: Secondary | ICD-10-CM

## 2021-03-23 DIAGNOSIS — H6990 Unspecified Eustachian tube disorder, unspecified ear: Secondary | ICD-10-CM

## 2021-03-23 DIAGNOSIS — H6980 Other specified disorders of Eustachian tube, unspecified ear: Secondary | ICD-10-CM

## 2021-03-23 DIAGNOSIS — R052 Subacute cough: Secondary | ICD-10-CM

## 2021-03-23 DIAGNOSIS — R42 Dizziness and giddiness: Secondary | ICD-10-CM

## 2021-03-23 MED ORDER — BECLOMETHASONE DIPROPIONATE 40 MCG/ACT NA AERS: 1.0000 | INHALATION_SPRAY | Freq: Two times a day (BID) | NASAL | 1 refills | Status: DC

## 2021-03-23 MED ORDER — AZITHROMYCIN 200 MG/5ML PO SUSR: ORAL | 0 refills | Status: DC

## 2021-03-23 MED ORDER — AZITHROMYCIN 250 MG PO TABS: ORAL_TABLET | ORAL | 0 refills | Status: DC

## 2021-03-23 NOTE — Progress Notes (Signed)
Subjective:    Patient ID: Marilyn [name], female    DOB: 1965/09/10, 55 y.o.   MRN: [id number]  Chief Complaint  Patient presents with   Sinusitis    HPI Patient was seen today for ongoing concern.  Pt endorses continued nasal congestion, facial pain/pressure, ear pain/pressure, dizziness, and cough x 2 wks.  Had chills.  Pt seen 03/17/21 by Dr. Elease Hashimoto, given Augmentin.  Influenza testing negative.  Seen 03/20/21 at [facility] for URI with cough and covid exposure.  COVID testing negative x 2.  Denies sore throat, fever, n/v.  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   ASTHMA 01/30/2007   Asthma    Breast tumor    right; benign   Chest pain 01/24/2017   CHEST PAIN 01/29/2009   Qualifier: Diagnosis of  By: Silvio Pate MD, [facility]    GERD 01/31/2007   Hemorrhoids    Hiatal hernia    HTN (hypertension) 09/09/2011   Hyperlipidemia    Hypersomnolence 12/15/2012   HYPOTHYROIDISM 01/30/2007   Mallory - Mariel Kansky tear 04/2003   Neck pain 09/09/2011   Obesity 04/11/2014    Allergies  Allergen Reactions   Promethazine-Dm    Geralyn Flash [Fish Allergy] Anaphylaxis   Codeine Nausea And Vomiting   Fluticasone Propionate Other (See Comments)   Promethazine Other (See Comments)   Flonase [Fluticasone Propionate] Other (See Comments)    Nose bleeds    ROS General: Denies fever, chills, night sweats, changes in weight, changes in appetite +dizziness HEENT: Denies headaches, changes in vision, rhinorrhea, sore throat +nasal congestion, ear pain/pressure, facial pain/pressure CV: Denies CP, palpitations, SOB, orthopnea Pulm: Denies SOB, wheezing +cough GI: Denies abdominal pain, nausea, vomiting, diarrhea, constipation GU: Denies dysuria, hematuria, frequency, vaginal discharge Msk: Denies muscle cramps, joint pains Neuro: Denies weakness, numbness, tingling Skin: Denies rashes, bruising Psych: Denies depression, anxiety,  hallucinations  Objective:    Blood pressure 126/84, pulse (!) 106, temperature 98.5 F (36.9 C), temperature source Oral, height 5\' 3"  (1.6 m), weight 192 lb 12.8 oz (87.5 kg), last menstrual period 03/18/2014, SpO2 98 %.  Gen. Pleasant, well-nourished, in no distress, normal affect   HEENT: [place]/AT, face symmetric, conjunctiva clear, no scleral icterus, PERRLA, EOMI, nares patent without drainage, nasal turbinates edematous and boggy.  Pharynx without erythema or exudate.  TMs full bilaterally without erythema.  TTP of maxillary and frontal sinuses. Lungs: no accessory muscle use, CTAB, no wheezes or rales Cardiovascular: Tachycardia, no m/r/g, no peripheral edema Abdomen: BS present, soft, NT/ND, no hepatosplenomegaly. Musculoskeletal: No deformities, no cyanosis or clubbing, normal tone Neuro:  A&Ox3, CN II-XII intact, normal gait Skin:  Warm, no lesions/ rash   Wt Readings from Last 3 Encounters:  03/23/21 192 lb 12.8 oz (87.5 kg)  03/17/21 194 lb 4.8 oz (88.1 kg)  08/10/20 191 lb (86.6 kg)    Lab Results  Component Value Date   WBC 4.7 08/10/2020   HGB 12.7 08/10/2020   HCT 38.3 08/10/2020   PLT 252.0 08/10/2020   GLUCOSE 87 03/04/2020   CHOL 157 02/20/2021   TRIG 65 02/20/2021   HDL 46 02/20/2021   LDLDIRECT 165.3 12/15/2012   LDLCALC 98 02/20/2021   ALT 24 02/20/2021   AST 30 02/20/2021   NA 139 03/04/2020   K 4.1 03/04/2020   CL 102 03/04/2020   CREATININE 0.80 03/04/2020   BUN 12 03/04/2020   CO2 28 03/04/2020   TSH 0.52 03/04/2020   HGBA1C  5.6 12/30/2019   MICROALBUR 1.0 10/29/2019    Assessment/Plan:  Subacute pansinusitis  - Plan: Beclomethasone Dipropionate 40 MCG/ACT AERS, azithromycin (ZITHROMAX) 200 MG/5ML suspension, DISCONTINUED: azithromycin (ZITHROMAX) 250 MG tablet  Subacute cough  Dizziness  Dysfunction of Eustachian tube, unspecified laterality  Given continued/worsened symptoms despite Augmentin we will switch to azithromycin.  Patient  requesting liquid.  Continue supportive care including steam, rest, fluids, Tylenol or ibuprofen as needed.  We will also try a lower dose of Qnasl nasal spray as it worked well for patient in the past.  Flonase caused epistaxis.  Increased sinus pressure/eustachian tube dysfunction likely causing dizziness.  Post nasal drainage contributing to cough.  Given precautions.  F/u prn  Grier Mitts, MD

## 2021-04-03 ENCOUNTER — Other Ambulatory Visit: Payer: Self-pay | Admitting: Family Medicine

## 2021-04-03 DIAGNOSIS — E119 Type 2 diabetes mellitus without complications: Secondary | ICD-10-CM

## 2021-04-14 ENCOUNTER — Other Ambulatory Visit: Payer: Self-pay | Admitting: Family Medicine

## 2021-04-14 DIAGNOSIS — I10 Essential (primary) hypertension: Secondary | ICD-10-CM

## 2021-05-20 ENCOUNTER — Other Ambulatory Visit: Payer: Self-pay | Admitting: Family Medicine

## 2021-05-23 ENCOUNTER — Telehealth (INDEPENDENT_AMBULATORY_CARE_PROVIDER_SITE_OTHER): Payer: BC Managed Care – PPO | Admitting: Family Medicine

## 2021-05-23 ENCOUNTER — Encounter: Payer: Self-pay | Admitting: Family Medicine

## 2021-05-23 DIAGNOSIS — U071 COVID-19: Secondary | ICD-10-CM

## 2021-05-23 MED ORDER — MOLNUPIRAVIR EUA 200MG CAPSULE: 4.0000 | ORAL_CAPSULE | Freq: Two times a day (BID) | ORAL | 0 refills | Status: AC

## 2021-05-23 NOTE — Patient Instructions (Addendum)
HOME CARE TIPS:  -COVID19 testing information: [email or web address]  Most pharmacies also offer testing and home test kits. If the Covid19 test is positive and you desire antiviral treatment, please contact a [facility] or schedule a follow up virtual visit through your primary care office or through the [facility].  Other test to treat options: [email or web address]  -I sent the medication(s) we discussed to your pharmacy: Meds ordered this encounter  Medications   molnupiravir EUA (LAGEVRIO) 200 mg CAPS capsule    Sig: Take 4 capsules (800 mg total) by mouth 2 (two) times daily for 5 days.    Dispense:  40 capsule    Refill:  0     -I sent in the [facility] treatment or referral you requested per our discussion. Please see the information provided below and discuss further with the pharmacist/treatment team.   -there is a chance of rebound illness after finishing your treatment. If you become sick again please isolate for an additional 5 days, plus 5 more days of masking.   -can use tylenol [id number] up to 3 times daily if needed for fevers, aches and pains per instructions  -can use nasal saline a few times per day if you have nasal congestion  -stay hydrated, drink plenty of fluids and eat small healthy meals - avoid dairy  -follow up with your doctor in 2-3 days unless improving and feeling better  -stay home while sick, except to seek medical care. If you have COVID19, you will likely be contagious for 7-10 days. Flu or Influenza is likely contagious for about 7 days. Other respiratory viral infections remain contagious for 5-10+ days depending on the virus and many other factors. Wear a good mask that fits snugly (such as N95 or KN95) if around others to reduce the risk of transmission.  It was nice to meet you today, and I really hope you are feeling better soon. I help [facility] out with telemedicine  visits on Tuesdays and Thursdays and am happy to help if you need a follow up virtual visit on those days. Otherwise, if you have any concerns or questions following this visit please schedule a follow up visit with your Primary Care doctor or seek care at a local urgent care clinic to avoid delays in care.    Seek in person care or schedule a follow up video visit promptly if your symptoms worsen, new concerns arise or you are not improving with treatment. Call 911 and/or seek emergency care if your symptoms are severe or life threatening.    Fact Sheet for Patients And Caregivers Emergency Use Authorization (EUA) Of LAGEVRIO (molnupiravir) capsules For Coronavirus Disease 2019 (COVID-19)  What is the most important information I should know about LAGEVRIO? LAGEVRIO may cause serious side effects, including: ? LAGEVRIO may cause harm to your unborn baby. It is not known if LAGEVRIO will harm your baby if you take LAGEVRIO during pregnancy. o LAGEVRIO is not recommended for use in pregnancy. o LAGEVRIO has not been studied in pregnancy. LAGEVRIO was studied in pregnant animals only. When LAGEVRIO was given to pregnant animals, LAGEVRIO caused harm to their unborn babies. o You and your healthcare provider may decide that you should take LAGEVRIO during pregnancy if there are no other COVID-19 treatment options approved or authorized by the FDA that are accessible or clinically appropriate for you. o If you and your healthcare provider decide that you should take LAGEVRIO during pregnancy, you and your healthcare provider should discuss the known  and potential benefits and the potential risks of taking LAGEVRIO during pregnancy. For individuals who are able to become pregnant: ? You should use a reliable method of birth control (contraception) consistently and correctly during treatment with LAGEVRIO and for 4 days after the last dose of LAGEVRIO. Talk to your healthcare provider about  reliable birth control methods. ? Before starting treatment with [facility] your healthcare provider may do a pregnancy test to see if you are pregnant before starting treatment with LAGEVRIO. ? Tell your healthcare provider right away if you become pregnant or think you may be pregnant during treatment with LAGEVRIO. Pregnancy Surveillance Program: ? There is a pregnancy surveillance program for individuals who take LAGEVRIO during pregnancy. The purpose of this program is to collect information about the health of you and your baby. Talk to your healthcare provider about how to take part in this program. ? If you take LAGEVRIO during pregnancy and you agree to participate in the pregnancy surveillance program and allow your healthcare provider to share your information with [facility], then your healthcare provider will report your use of [place] during pregnancy to [place]. by calling [phone] or [email or web address]. For individuals who are sexually active with partners who are able to become pregnant: ? It is not known if LAGEVRIO can affect sperm. While the risk is regarded as low, animal studies to fully assess the potential for LAGEVRIO to affect the babies of males treated with LAGEVRIO have not been completed. A reliable method of birth control (contraception) should be used consistently and correctly during treatment with LAGEVRIO and for at least 3 months after the last dose. The risk to sperm beyond 3 months is not known. Studies to understand the risk to sperm beyond 3 months are ongoing. Talk to your healthcare provider about reliable birth control methods. Talk to your healthcare provider if you have questions or concerns about how LAGEVRIO may affect sperm. You are being given this fact sheet because your healthcare provider believes it is necessary to provide you with LAGEVRIO for the treatment of adults with mild-to-moderate  coronavirus disease 2019 (COVID-19) with positive results of direct SARS-CoV-2 viral testing, and who are at high risk for progression to severe COVID-19 including hospitalization or death, and for whom other COVID-19 treatment options approved or authorized by the FDA are not accessible or clinically appropriate. The U.S. Food and Drug Administration (FDA) has issued an Emergency Use Authorization (EUA) to make LAGEVRIO available during the COVID-19 pandemic (for more details about an EUA please see What is an Emergency Use Authorization? at the end of this document). LAGEVRIO is not an FDA-approved medicine in the [place]. Read this Fact Sheet for information about LAGEVRIO. Talk to your healthcare provider about your options if you have any questions. It is your choice to take LAGEVRIO.  What is COVID-19? COVID-19 is caused by a virus called a coronavirus. You can get COVID-19 through close contact with another person who has the virus. COVID-19 illnesses have ranged from very mild-to-severe, including illness resulting in death. While information so far suggests that most COVID-19 illness is mild, serious illness can happen and may cause some of your other medical conditions to become worse. Older people and people of all ages with severe, long lasting (chronic) medical conditions like heart disease, lung disease and diabetes, for example seem to be at higher risk of being hospitalized for COVID-19.  What is LAGEVRIO? LAGEVRIO is an investigational medicine used to  treat mild-to-moderate COVID-19 in adults: ? with positive results of direct SARS-CoV-2 viral testing, and ? who are at high risk for progression to severe COVID-19 including hospitalization or death, and for whom other COVID-19 treatment options approved or authorized by the FDA are not accessible or clinically appropriate. The FDA has authorized the emergency use of LAGEVRIO for the treatment of mild-tomoderate  COVID-19 in adults under an EUA. For more information on EUA, see the What is an Emergency Use Authorization (EUA)? section at the end of this Fact Sheet. LAGEVRIO is not authorized: ? for use in people less than 40 years of age. ? for prevention of COVID-19. ? for people needing hospitalization for COVID-19. ? for use for longer than 5 consecutive days.  What should I tell my healthcare provider before I take LAGEVRIO? Tell your healthcare provider if you: ? Have any allergies ? Are breastfeeding or plan to breastfeed ? Have any serious illnesses ? Are taking any medicines (prescription, over-the-counter, vitamins, or herbal products).  How do I take LAGEVRIO? ? Take LAGEVRIO exactly as your healthcare provider tells you to take it. ? Take 4 capsules of LAGEVRIO every 12 hours (for example, at 8 am and at 8 pm) ? Take LAGEVRIO for 5 days. It is important that you complete the full 5 days of treatment with LAGEVRIO. Do not stop taking LAGEVRIO before you complete the full 5 days of treatment, even if you feel better. ? Take LAGEVRIO with or without food. ? You should stay in isolation for as long as your healthcare provider tells you to. Talk to your healthcare provider if you are not sure about how to properly isolate while you have COVID-19. ? Swallow LAGEVRIO capsules whole. Do not open, break, or crush the capsules. If you cannot swallow capsules whole, tell your healthcare provider. ? What to do if you miss a dose: o If it has been less than 10 hours since the missed dose, take it as soon as you remember o If it has been more than 10 hours since the missed dose, skip the missed dose and take your dose at the next scheduled time. ? Do not double the dose of LAGEVRIO to make up for a missed dose.  What are the important possible side effects of LAGEVRIO? ? See, What is the most important information I should know about LAGEVRIO? ? Allergic Reactions. Allergic reactions can  happen in people taking LAGEVRIO, even after only 1 dose. Stop taking LAGEVRIO and call your healthcare provider right away if you get any of the following symptoms of an allergic reaction: o hives o rapid heartbeat o trouble swallowing or breathing o swelling of the mouth, lips, or face o throat tightness o hoarseness o skin rash The most common side effects of LAGEVRIO are: ? diarrhea ? nausea ? dizziness These are not all the possible side effects of LAGEVRIO. Not many people have taken LAGEVRIO. Serious and unexpected side effects may happen. This medicine is still being studied, so it is possible that all of the risks are not known at this time.  What other treatment choices are there?  Veklury (remdesivir) is FDA-approved as an intravenous (IV) infusion for the treatment of mildto-moderate PRFFM-38 in certain adults and children. Talk with your doctor to see if Marijean Heath is appropriate for you. Like LAGEVRIO, FDA may also allow for the emergency use of other medicines to treat people with COVID-19. Go to [email or web address].[email or web address] for more information. It is your choice to be  treated or not to be treated with LAGEVRIO. Should you decide not to take it, it will not change your standard medical care.  What if I am breastfeeding? Breastfeeding is not recommended during treatment with LAGEVRIO and for 4 days after the last dose of LAGEVRIO. If you are breastfeeding or plan to breastfeed, talk to your healthcare provider about your options and specific situation before taking LAGEVRIO.  How do I report side effects with LAGEVRIO? Contact your healthcare provider if you have any side effects that bother you or do not go away. Report side effects to FDA MedWatch at SmoothHits.hu or call 1-800-FDA-1088 (1- 984-799-9419).  How should I store Vandiver? ? Store LAGEVRIO capsules at  room temperature between 56F to 46F (20C to 25C). ? Keep LAGEVRIO and all medicines out of the reach of children and pets. How can I learn more about COVID-19? ? Ask your healthcare provider. ? Visit SeekRooms.co.uk ? Contact your local or state public health department. ? Call [place] at [phone] (toll free in the [place] ? Visit [email or web address].com  What Is an Emergency Use Authorization (EUA)? The [place] FDA has made [place] available under an emergency access mechanism called an Emergency Use Authorization (EUA) The EUA is supported by a Presenter, broadcasting Health and Human Service (HHS) declaration that circumstances exist to justify emergency use of drugs and biological products during the COVID-19 pandemic. LAGEVRIO for the treatment of mild-to-moderate COVID-19 in adults with positive results of direct SARS-CoV-2 viral testing, who are at high risk for progression to severe COVID-19, including hospitalization or death, and for whom alternative COVID-19 treatment options approved or authorized by FDA are not accessible or clinically appropriate, has not undergone the same type of review as an FDA-approved product. In issuing an EUA under the FWYOV-78 public health emergency, the FDA has determined, among other things, that based on the total amount of scientific evidence available including data from adequate and well-controlled clinical trials, if available, it is reasonable to believe that the product may be effective for diagnosing, treating, or preventing COVID-19, or a serious or life-threatening disease or condition caused by COVID-19; that the known and potential benefits of the product, when used to diagnose, treat, or prevent such disease or condition, outweigh the known and potential risks of such product; and that there are no adequate, approved, and available alternatives.  All of these criteria must be met to allow for the product to be used in  the treatment of patients during the COVID-19 pandemic. The EUA for LAGEVRIO is in effect for the duration of the COVID-19 declaration justifying emergency use of LAGEVRIO, unless terminated or revoked (after which LAGEVRIO may no longer be used under the EUA). For patent information: http://rogers.info/ Copyright  2021-2022 [place] [place], [place] and its affiliates. All rights reserved. [id number] Revised: March 2022

## 2021-05-23 NOTE — Progress Notes (Signed)
Virtual Visit via Video Note  I connected with Marilyn [name]  on 05/23/21 at  4:20 PM EST by a video enabled telemedicine application and verified that I am speaking with the correct person using two identifiers.  Location patient: home, [place] Location provider:work or home office Persons participating in the virtual visit: patient, provider  I discussed the limitations of evaluation and management by telemedicine and the availability of in person appointments. The patient expressed understanding and agreed to proceed.   HPI:  Acute telemedicine visit for Covid19: -Onset: 3 days, test on Sunday were positive for covid -Symptoms include: body aches, cough, ha - now resolved, nasal congestion -doing better somewhat -Denies:fever, CP, SOB, NVD -Pertinent past medical history: see below, has not had covid before -Pertinent medication allergies:  Allergies  Allergen Reactions   Promethazine-Dm    Geralyn Flash [Fish Allergy] Anaphylaxis   Codeine Nausea And Vomiting   Fluticasone Propionate Other (See Comments)   Promethazine Other (See Comments)   Flonase [Fluticasone Propionate] Other (See Comments)    Nose bleeds  -COVID-19 vaccine status:  none -no recent lab work Immunization History  Administered Date(s) Administered   Influenza Split 04/30/2011   Influenza Whole 03/04/2012   Influenza,inj,Quad PF,6+ Mos 04/01/2014, 07/20/2015, 02/20/2017   Influenza-Unspecified 02/02/2014   PPD Test 03/25/2017   Pneumococcal Polysaccharide-23 04/01/2014   Td 06/04/1997, 12/22/2009     ROS: See pertinent positives and negatives per HPI.  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   Neck pain 09/09/2011   Obesity 04/11/2014    Past Surgical History:  Procedure Laterality Date   BREAST BIOPSY Right 12/2014   BREAST EXCISIONAL BIOPSY Right 2016   BREAST LUMPECTOMY WITH RADIOACTIVE SEED LOCALIZATION Right 02/25/2015   Procedure: BREAST LUMPECTOMY WITH RADIOACTIVE SEED LOCALIZATION;  Surgeon: Alphonsa Overall, MD;  Location: [facility];  Service: General;  Laterality: Right;   BREAST REDUCTION SURGERY  1986   BREAST SURGERY     broken jaw     CARPAL TUNNEL RELEASE  2001   FRACTURE SURGERY     REDUCTION MAMMAPLASTY Bilateral    THYROIDECTOMY     TUBAL LIGATION  1987     Current Outpatient Medications:    molnupiravir EUA (LAGEVRIO) 200 mg CAPS capsule, Take 4 capsules (800 mg total) by mouth 2 (two) times daily for 5 days., Disp: 40 capsule, Rfl: 0   acetaminophen (TYLENOL) 500 MG tablet, Take 500 mg by mouth as needed for moderate pain (arthritis pain)., Disp: , Rfl:    albuterol (VENTOLIN HFA) 108 (90 Base) MCG/ACT inhaler, Inhale 2 puffs into the lungs every 4 (four) hours as needed for wheezing or shortness of breath (cough, shortness of breath or wheezing.)., Disp: 6.7 g, Rfl: 1   amLODipine (NORVASC) 10 MG tablet, TAKE ONE TABLET BY MOUTH DAILY, Disp: 55 tablet, Rfl: 1   amoxicillin-clavulanate (AUGMENTIN) 875-125 MG tablet, Take 1 tablet by mouth 2 (two) times daily. (Patient not taking: Reported on 03/23/2021), Disp: 14 tablet, Rfl: 0  aspirin EC 81 MG tablet, Take 1 tablet (81 mg total) by mouth daily. Swallow whole., Disp: 90 tablet, Rfl: 3   atorvastatin (LIPITOR) 20 MG tablet, Take 1 tablet (20 mg total) by mouth daily., Disp: 90 tablet, Rfl: 3   azithromycin (ZITHROMAX) 200 MG/5ML suspension, Take 12.5 mLs on day one.  Then take 6.25 mL daily on days 2-5., Disp: 45 mL, Rfl: 0   Beclomethasone Dipropionate 40 MCG/ACT AERS, Place 1 spray into the nose in the  morning and at bedtime., Disp: 6.8 g, Rfl: 1   Blood Glucose Monitoring Suppl (ONETOUCH VERIO FLEX SYSTEM) w/Device KIT, Use to check blood glucose daily (E11.69), Disp: 1 kit, Rfl: 0   Cholecalciferol (VITAMIN D) 125 MCG (5000 UT) CAPS, Take 1 tablet by mouth daily., Disp: , Rfl:    cyclobenzaprine (FLEXERIL) 5 MG tablet, Take 1 tablet (5 mg total) by mouth 3 (three) times daily as needed., Disp: 90 tablet, Rfl: 1   EPIPEN 2-PAK 0.3 MG/0.3ML SOAJ injection, Inject 0.3 mLs (0.3 mg total) into the muscle as needed for anaphylaxis., Disp: 1 each, Rfl: 0   fexofenadine (ALLEGRA ALLERGY) 180 MG tablet, Take 1 tablet (180 mg total) by mouth daily., Disp: 30 tablet, Rfl: 3   glucose blood (ONETOUCH VERIO) test strip, USE AS DIRECTED TO CHECK BLOOD SUGAR UP THREE TIMES A DAY, Disp: 100 strip, Rfl: 1   hydrOXYzine (ATARAX/VISTARIL) 50 MG tablet, Take 1 tablet (50 mg total) by mouth 3 (three) times daily as needed., Disp: 30 tablet, Rfl: 0   Lancets (ONETOUCH DELICA PLUS [id number]) MISC, USE TO TEST ONCE DAILY, Disp: 100 each, Rfl: 2   levothyroxine (SYNTHROID) 100 MCG tablet, TAKE ONE TABLET BY MOUTH DAILY, Disp: 90 tablet, Rfl: 1   omeprazole (PRILOSEC) 40 MG capsule, Take 1 capsule (40 mg total) by mouth 2 (two) times a day., Disp: 60 capsule, Rfl: 11   ondansetron (ZOFRAN-ODT) 4 MG disintegrating tablet, Take 1 tablet (4 mg total) by mouth every 8 (eight) hours as needed for nausea or vomiting., Disp: 8 tablet, Rfl: 0   polyethylene glycol powder (GLYCOLAX/MIRALAX) powder, Take 17 g by mouth daily., Disp: 500 g, Rfl: 1   Probiotic Product (PROBIOTIC PO), Take 2 tablets by mouth daily., Disp: , Rfl:    vitamin B-12 (CYANOCOBALAMIN) 500 MCG tablet, Take 500 mcg by mouth daily., Disp: , Rfl:   EXAM:  VITALS per patient if applicable:  GENERAL: alert, oriented, appears well and in no acute distress  HEENT: atraumatic, conjunttiva clear, no obvious abnormalities on inspection of external nose and  ears  NECK: normal movements of the head and neck  LUNGS: on inspection no signs of respiratory distress, breathing rate appears normal, no obvious gross SOB, gasping or wheezing  CV: no obvious cyanosis  MS: moves all visible extremities without noticeable abnormality  PSYCH/NEURO: pleasant and cooperative, no obvious depression or anxiety, speech and thought processing grossly intact  ASSESSMENT AND PLAN:  Discussed the following assessment and plan:  COVID-19   Discussed treatment options and risk of drug interactions, ideal treatment window, potential complications, isolation and precautions for COVID-19.  Discussed possibility of rebound with antivirals and the need to reisolate if it should occur for 5 days.no recent GFR. After lengthy discussion, the patient opted for treatment with Legevrio  due to being higher risk for complications of covid or severe disease and other factors. Discussed EUA status of this drug and the fact that there is preliminary limited knowledge of risks/interactions/side effects per  EUA document vs possible benefits and precautions. This information was shared with patient during the visit and also was provided in patient instructions.   Other symptomatic care measures summarized in patient instructions. Advised to seek prompt in person care if worsening, new symptoms arise, or if is not improving with treatment. Discussed options for inperson care if PCP office not available. Did let this patient know that I only do telemedicine on Tuesdays and Thursdays for [place]. Advised to schedule follow up visit with PCP or [facility] if any further questions or concerns to avoid delays in care.   I discussed the assessment and treatment plan with the patient. The patient was provided an opportunity to ask questions and all were answered. The patient agreed with the plan and demonstrated an understanding of the instructions.     Lucretia Kern, DO

## 2021-06-05 ENCOUNTER — Encounter: Payer: Self-pay | Admitting: Family Medicine

## 2021-06-06 ENCOUNTER — Encounter: Payer: Self-pay | Admitting: Family Medicine

## 2021-06-07 ENCOUNTER — Ambulatory Visit: Payer: BC Managed Care – PPO | Admitting: Family Medicine

## 2021-06-07 ENCOUNTER — Ambulatory Visit (INDEPENDENT_AMBULATORY_CARE_PROVIDER_SITE_OTHER): Payer: BC Managed Care – PPO

## 2021-06-07 ENCOUNTER — Other Ambulatory Visit: Payer: Self-pay

## 2021-06-07 VITALS — BP 138/90 | HR 110 | Temp 98.7°F | Wt 191.7 lb

## 2021-06-07 DIAGNOSIS — J4541 Moderate persistent asthma with (acute) exacerbation: Secondary | ICD-10-CM

## 2021-06-07 DIAGNOSIS — R059 Cough, unspecified: Secondary | ICD-10-CM

## 2021-06-07 MED ORDER — BENZONATATE 100 MG PO CAPS: 100.0000 mg | ORAL_CAPSULE | Freq: Three times a day (TID) | ORAL | 1 refills | Status: DC | PRN

## 2021-06-07 MED ORDER — METHYLPREDNISOLONE ACETATE 80 MG/ML IJ SUSP
80.0000 mg | Freq: Once | INTRAMUSCULAR | Status: AC
  Administered 2021-06-07: 80 mg via INTRAMUSCULAR

## 2021-06-07 MED ORDER — CEFDINIR 300 MG PO CAPS: 300.0000 mg | ORAL_CAPSULE | Freq: Two times a day (BID) | ORAL | 0 refills | Status: DC

## 2021-06-07 NOTE — Telephone Encounter (Signed)
Pt was seen by  Dr Elease Hashimoto today.

## 2021-06-07 NOTE — Patient Instructions (Signed)
Let me know if cough not improving over next several days.

## 2021-06-07 NOTE — Telephone Encounter (Signed)
Pt was seen by Dr Elease Hashimoto today, chest xray was ordered and completed.

## 2021-06-07 NOTE — Progress Notes (Signed)
Established Patient Office Visit  Subjective:  Patient ID: Marilyn [name], female    DOB: 1966/03/01  Age: 56 y.o. 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  She had COVID infection mid December.  Never treated with antivirals.  She has had persistent cough since then and occasionally even mucus streaked with blood.  She reportedly has history of asthma.  Also has cough productive of thick green-yellow sputum frequently.  No documented fever.  Cough particularly severe at night.  She cannot take hydrocodone or codeine.  She has hypertension.  No ACE inhibitor use.  Smoked only briefly back in her teens.  No chest pains.  No pleuritic pain.  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   Neck pain 09/09/2011   Obesity 04/11/2014    Past Surgical History:  Procedure Laterality Date   BREAST BIOPSY Right 12/2014   BREAST EXCISIONAL BIOPSY Right 2016   BREAST LUMPECTOMY WITH RADIOACTIVE SEED LOCALIZATION Right 02/25/2015   Procedure: BREAST LUMPECTOMY WITH RADIOACTIVE SEED LOCALIZATION;  Surgeon: Alphonsa Overall, MD;  Location: [facility];  Service: General;  Laterality: Right;   BREAST REDUCTION SURGERY  1986   BREAST SURGERY     broken jaw     CARPAL TUNNEL RELEASE  2001   FRACTURE SURGERY     REDUCTION MAMMAPLASTY Bilateral    THYROIDECTOMY     TUBAL LIGATION  1987     Family History  Problem Relation Age of Onset   Hypertension Father    Diabetes Father    Hyperlipidemia Father    Asthma Mother    Breast cancer Sister 18   Thyroid disease Sister    Diabetes Maternal Grandmother    Heart disease Maternal Grandmother    Heart attack Maternal Grandmother 60   Stroke Maternal Grandmother    Asthma Maternal Grandfather    Heart attack Maternal Grandfather    Stroke Maternal Grandfather    Colon cancer Neg Hx     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    Employer: [employer]  Tobacco Use   Smoking status: Former    Packs/day: 0.25    Years: 3.00    Pack years: 0.75    Types: Cigarettes    Quit date: 06/04/1980    Years since quitting: 41.0   Smokeless tobacco: Never  Vaping Use   Vaping Use: Never used  Substance and Sexual Activity   Alcohol use: No    Alcohol/week: 0.0 standard drinks   Drug use: No  Sexual activity: Not Currently    Comment: 1st intercourse 71 yo-5 partners  Other Topics Concern   Not on file  Social History Narrative   Married   Social Determinants of Health   Financial Resource Strain: Not on file  Food Insecurity: Not on file  Transportation Needs: Not on file  Physical Activity: Not on file  Stress: Not on file  Social Connections: Not on file  Intimate Partner Violence: Not on file    Outpatient Medications Prior to Visit  Medication Sig Dispense Refill   acetaminophen (TYLENOL) 500 MG tablet Take 500 mg by mouth as needed for moderate pain (arthritis pain).     albuterol (VENTOLIN HFA) 108 (90 Base) MCG/ACT inhaler Inhale 2 puffs into the lungs every 4 (four) hours as needed for wheezing or shortness of breath (cough, shortness of breath or wheezing.). 6.7 g 1   amLODipine (NORVASC) 10  MG tablet TAKE ONE TABLET BY MOUTH DAILY 55 tablet 1   aspirin EC 81 MG tablet Take 1 tablet (81 mg total) by mouth daily. Swallow whole. 90 tablet 3   atorvastatin (LIPITOR) 20 MG tablet Take 1 tablet (20 mg total) by mouth daily. 90 tablet 3   Beclomethasone Dipropionate 40 MCG/ACT AERS Place 1 spray into the nose in the morning and at bedtime. 6.8 g 1   Blood Glucose Monitoring Suppl (ONETOUCH VERIO FLEX SYSTEM) w/Device KIT Use to check blood glucose daily (E11.69) 1 kit 0   Cholecalciferol (VITAMIN D) 125 MCG (5000 UT) CAPS Take 1 tablet by mouth daily.     cyclobenzaprine (FLEXERIL) 5 MG tablet Take 1 tablet (5 mg total) by mouth 3 (three) times daily as needed. 90 tablet 1   EPIPEN 2-PAK 0.3 MG/0.3ML SOAJ injection Inject 0.3 mLs (0.3 mg total) into the muscle as needed for anaphylaxis. 1 each 0   fexofenadine (ALLEGRA ALLERGY) 180 MG tablet Take 1 tablet (180 mg total) by mouth daily. 30 tablet 3   glucose blood (ONETOUCH VERIO) test strip USE AS DIRECTED TO CHECK BLOOD SUGAR UP THREE TIMES A DAY 100 strip 1   hydrOXYzine (ATARAX/VISTARIL) 50 MG tablet Take 1 tablet (50 mg total) by mouth 3 (three) times daily as needed. 30 tablet 0   Lancets (ONETOUCH DELICA PLUS QASTMH96Q) MISC USE TO TEST ONCE DAILY 100 each 2   levothyroxine (SYNTHROID) 100 MCG tablet TAKE ONE TABLET BY MOUTH DAILY 90 tablet 1   omeprazole (PRILOSEC) 40 MG capsule Take 1 capsule (40 mg total) by mouth 2 (two) times a day. 60 capsule 11   Probiotic Product (PROBIOTIC PO) Take 2 tablets by mouth daily.     ondansetron (ZOFRAN-ODT) 4 MG disintegrating tablet Take 1 tablet (4 mg total) by mouth every 8 (eight) hours as needed for nausea or vomiting. (Patient not taking: Reported on 06/07/2021) 8 tablet 0   polyethylene glycol powder (GLYCOLAX/MIRALAX) powder Take 17 g by mouth daily. (Patient not taking: Reported on 06/07/2021) 500 g 1   vitamin B-12 (CYANOCOBALAMIN) 500 MCG tablet Take 500 mcg by mouth daily. (Patient not  taking: Reported on 06/07/2021)     amoxicillin-clavulanate (AUGMENTIN) 875-125 MG tablet Take 1 tablet by mouth 2 (two) times daily. (Patient not taking: Reported on 06/07/2021) 14 tablet 0   azithromycin (ZITHROMAX) 200 MG/5ML suspension Take 12.5 mLs on day one.  Then take 6.25 mL daily on days 2-5. (Patient not taking: Reported on 06/07/2021) 45 mL 0   No facility-administered medications prior to visit.  Allergies  Allergen Reactions   Promethazine-Dm    Blain Pais Allergy] Anaphylaxis   Codeine Nausea And Vomiting   Fluticasone Propionate Other (See Comments)   Promethazine Other (See Comments)   Flonase [Fluticasone Propionate] Other (See Comments)    Nose bleeds    ROS Review of Systems  Constitutional:  Negative for chills and fever.  HENT:  Negative for sinus pressure and sinus pain.   Respiratory:  Positive for cough.   Cardiovascular:  Negative for chest pain, palpitations and leg swelling.     Objective:    Physical Exam Vitals reviewed.  Constitutional:      Appearance: Normal appearance.  Cardiovascular:     Rate and Rhythm: Regular rhythm.  Pulmonary:     Effort: Pulmonary effort is normal. No respiratory distress.     Breath sounds: Normal breath sounds. No wheezing or rales.  Musculoskeletal:     Right lower leg: No edema.     Left lower leg: No edema.  Neurological:     Mental Status: She is alert.    BP 138/90 (BP Location: Right Arm, Patient Position: Sitting, Cuff Size: Normal)    Pulse (!) 110    Temp 98.7 F (37.1 C) (Oral)    Wt 191 lb 11.2 oz (87 kg)    LMP 03/18/2014    SpO2 95%    BMI 33.96 kg/m  Wt Readings from Last 3 Encounters:  06/07/21 191 lb 11.2 oz (87 kg)  03/23/21 192 lb 12.8 oz (87.5 kg)  03/17/21 194 lb 4.8 oz (88.1 kg)     Health Maintenance Due  Topic Date Due   Hepatitis C Screening  Never done   Zoster Vaccines- Shingrix (1 of 2) Never done   Pneumococcal Vaccine 56-43 Years old (2 - PCV) 04/02/2015   TETANUS/TDAP   12/23/2019   PAP SMEAR-Modifier  02/21/2020   HEMOGLOBIN A1C  07/01/2020   FOOT EXAM  10/28/2020   URINE MICROALBUMIN  10/28/2020   COLONOSCOPY (Pts 45-50yr Insurance coverage will need to be confirmed)  12/24/2020   INFLUENZA VACCINE  01/02/2021    There are no preventive care reminders to display for this patient.  Lab Results  Component Value Date   TSH 0.52 03/04/2020   Lab Results  Component Value Date   WBC 4.7 08/10/2020   HGB 12.7 08/10/2020   HCT 38.3 08/10/2020   MCV 82.0 08/10/2020   PLT 252.0 08/10/2020   Lab Results  Component Value Date   NA 139 03/04/2020   K 4.1 03/04/2020   CHLORIDE 105 06/03/2015   CO2 28 03/04/2020   GLUCOSE 87 03/04/2020   BUN 12 03/04/2020   CREATININE 0.80 03/04/2020   BILITOT 0.2 02/20/2021   ALKPHOS 76 02/20/2021   AST 30 02/20/2021   ALT 24 02/20/2021   PROT 7.2 02/20/2021   ALBUMIN 4.6 02/20/2021   CALCIUM 9.1 03/04/2020   ANIONGAP 10 07/30/2019   EGFR 87 (L) 06/03/2015   GFR 80.54 07/17/2019   Lab Results  Component Value Date   CHOL 157 02/20/2021   Lab Results  Component Value Date   HDL 46 02/20/2021   Lab Results  Component Value Date   LDLCALC 98 02/20/2021   Lab Results  Component Value Date   TRIG 65 02/20/2021   Lab Results  Component Value Date   CHOLHDL 3.4 02/20/2021   Lab Results  Component Value Date   HGBA1C 5.6 12/30/2019      Assessment & Plan:   Recent  COVID infection back in mid December.  Patient presents now with persistent cough which is productive.  No fever.  She has no respiratory distress but does have concern for intermittent wheezing.  -Obtain PA and lateral chest x-ray-no obvious pneumonia or infiltrate and this will be over read -Given duration of productive cough will cover with Omnicef 300 mg twice daily for 7 days -Depo-Medrol 80 mg IM given -Continue rescue inhaler as needed -Follow-up for any persistent cough or other symptoms and sooner for any worsening  symptoms   No orders of the defined types were placed in this encounter.   Follow-up: No follow-ups on file.    Carolann Littler, MD

## 2021-06-14 ENCOUNTER — Encounter: Payer: Self-pay | Admitting: Family Medicine

## 2021-06-15 ENCOUNTER — Other Ambulatory Visit: Payer: Self-pay | Admitting: Family Medicine

## 2021-07-05 ENCOUNTER — Encounter (HOSPITAL_COMMUNITY): Payer: Self-pay

## 2021-07-05 ENCOUNTER — Emergency Department (HOSPITAL_COMMUNITY): Payer: BC Managed Care – PPO

## 2021-07-05 ENCOUNTER — Emergency Department (HOSPITAL_COMMUNITY)
Admission: EM | Admit: 2021-07-05 | Discharge: 2021-07-05 | Disposition: A | Discharge status: Home / Self Care | Payer: BC Managed Care – PPO | Attending: Emergency Medicine | Admitting: Emergency Medicine

## 2021-07-05 DIAGNOSIS — K5730 Diverticulosis of large intestine without perforation or abscess without bleeding: Secondary | ICD-10-CM | POA: Insufficient documentation

## 2021-07-05 DIAGNOSIS — I3139 Other pericardial effusion (noninflammatory): Secondary | ICD-10-CM | POA: Insufficient documentation

## 2021-07-05 DIAGNOSIS — Z8616 Personal history of COVID-19: Secondary | ICD-10-CM | POA: Insufficient documentation

## 2021-07-05 DIAGNOSIS — Z79899 Other long term (current) drug therapy: Secondary | ICD-10-CM | POA: Diagnosis not present

## 2021-07-05 DIAGNOSIS — R072 Precordial pain: Secondary | ICD-10-CM

## 2021-07-05 DIAGNOSIS — R1030 Lower abdominal pain, unspecified: Secondary | ICD-10-CM

## 2021-07-05 DIAGNOSIS — M79604 Pain in right leg: Secondary | ICD-10-CM | POA: Insufficient documentation

## 2021-07-05 DIAGNOSIS — Z7982 Long term (current) use of aspirin: Secondary | ICD-10-CM | POA: Diagnosis not present

## 2021-07-05 DIAGNOSIS — D259 Leiomyoma of uterus, unspecified: Secondary | ICD-10-CM | POA: Diagnosis not present

## 2021-07-05 DIAGNOSIS — R1031 Right lower quadrant pain: Secondary | ICD-10-CM | POA: Diagnosis present

## 2021-07-05 LAB — CBC
HCT: 41.3 % (ref 36.0–46.0)
Hemoglobin: 13.4 g/dL (ref 12.0–15.0)
MCH: 27.2 pg (ref 26.0–34.0)
MCHC: 32.4 g/dL (ref 30.0–36.0)
MCV: 83.8 fL (ref 80.0–100.0)
Platelets: 250 10*3/uL (ref 150–400)
RBC: 4.93 MIL/uL (ref 3.87–5.11)
RDW: 13.3 % (ref 11.5–15.5)
WBC: 5.3 10*3/uL (ref 4.0–10.5)
nRBC: 0 % (ref 0.0–0.2)

## 2021-07-05 LAB — BASIC METABOLIC PANEL
Anion gap: 7 (ref 5–15)
BUN: 14 mg/dL (ref 6–20)
CO2: 29 mmol/L (ref 22–32)
Calcium: 9.3 mg/dL (ref 8.9–10.3)
Chloride: 102 mmol/L (ref 98–111)
Creatinine, Ser: 0.8 mg/dL (ref 0.44–1.00)
GFR, Estimated: >60 mL/min (ref >60)
Glucose, Bld: 95 mg/dL (ref 70–99)
Potassium: 4 mmol/L (ref 3.5–5.1)
Sodium: 138 mmol/L (ref 135–145)

## 2021-07-05 LAB — TROPONIN I (HIGH SENSITIVITY)
Troponin I (High Sensitivity): <2 ng/L (ref <18)
Troponin I (High Sensitivity): <2 ng/L (ref <18)

## 2021-07-05 MED ORDER — ACETAMINOPHEN 325 MG PO TABS
650.0000 mg | ORAL_TABLET | Freq: Once | ORAL | Status: AC
  Administered 2021-07-05: 650 mg via ORAL
  Filled 2021-07-05: qty 2

## 2021-07-05 MED ORDER — IOHEXOL 350 MG/ML SOLN
100.0000 mL | Freq: Once | INTRAVENOUS | Status: AC | PRN
  Administered 2021-07-05: 100 mL via INTRAVENOUS

## 2021-07-05 NOTE — Discharge Instructions (Signed)
Follow-up with your primary care provider and cardiology.  Return for new or worsening symptoms

## 2021-07-05 NOTE — ED Triage Notes (Signed)
Pt arrived via POV, c/o left arm pain and right leg pain as well as lower abd pain and excessive burping. Denies any nausea or vomiting.

## 2021-07-05 NOTE — ED Notes (Signed)
An After Visit Summary was printed and given to the patient. Discharge instructions given and no further questions at this time.  

## 2021-07-05 NOTE — ED Provider Triage Note (Signed)
Emergency Medicine Provider Triage Evaluation Note  Marilyn [name] , a 56 y.o. female  was evaluated in triage.  Pt complains of L arm pain, R leg pain, burping and abd pain since 9:30 am. Mild chest discomfort.  Review of Systems  Positive: Pain in L arm, chest discomfort  Negative: headache  Physical Exam  BP 109/71 (BP Location: Right Arm)    Pulse 89    Temp 98.5 F (36.9 C) (Oral)    Resp 16    LMP 03/18/2014    SpO2 100%  Gen:   Awake, no distress   Resp:  Normal effort  MSK:   Moves extremities without difficulty , 4+/5 strength  Medical Decision Making  Medically screening exam initiated at 11:30 AM.  Appropriate orders placed.  Aly Seidenberg was informed that the remainder of the evaluation will be completed by another provider, this initial triage assessment does not replace that evaluation, and the importance of remaining in the ED until their evaluation is complete.    Varney Biles, MD 07/05/21 1131

## 2021-07-05 NOTE — ED Provider Notes (Signed)
[place] DEPT Provider Note   CSN: [id number] Arrival date & time: 07/05/21  1027     History  Chief Complaint  Patient presents with   Arm Pain   Abdominal Pain    Marilyn [name] is a 56 y.o. female here for evaluation of multiple complaints.  States she was at work when she developed aching left arm pain.  She felt like her arm was swollen.  She also admits to right leg pain, burping, right lower quadrant abdominal pain.  Also admits to some mild generalized chest discomfort.  Feels "off."  She denies any exertional chest pain.  No history of PE, DVT, recent surgery, immobilization or malignancy.  States she does not typically get chest pain with her ADLs with typical activity.  No numbness, weakness.  No back pain.  Denies chance of pregnancy, no urinary complaints.  No HA, facial droop  Was recently diagnosed with COVID a little over 1 month ago.  Her PCP told her to come here to be evaluated for possible blood clot per patient she still has persistent cough since postdiagnosis.  Denies any hemoptysis.  Been seen previously for pelvic pain by gynecology.  HPI     Home Medications Prior to Admission medications   Medication Sig Start Date End Date Taking? Authorizing Provider  acetaminophen (TYLENOL) 500 MG tablet Take 500 mg by mouth as needed for moderate pain (arthritis pain).    [provider]  albuterol (VENTOLIN HFA) 108 (90 Base) MCG/ACT inhaler Inhale 2 puffs into the lungs every 4 (four) hours as needed for wheezing or shortness of breath (cough, shortness of breath or wheezing.). 06/15/19   Billie Ruddy, MD  amLODipine (NORVASC) 10 MG tablet TAKE ONE TABLET BY MOUTH DAILY 04/14/21   Billie Ruddy, MD  aspirin EC 81 MG tablet Take 1 tablet (81 mg total) by mouth daily. Swallow whole. 03/30/20   Chandrasekhar, Lyda Kalata A, MD  atorvastatin (LIPITOR) 20 MG tablet Take 1 tablet (20 mg total) by mouth daily. 11/10/20    Werner Lean, MD  Beclomethasone Dipropionate 40 MCG/ACT AERS Place 1 spray into the nose in the morning and at bedtime. 03/23/21   Billie Ruddy, MD  benzonatate (TESSALON PERLES) 100 MG capsule Take 1 capsule (100 mg total) by mouth 3 (three) times daily as needed for cough. 06/07/21   Burchette, Alinda Sierras, MD  Blood Glucose Monitoring Suppl (ONETOUCH VERIO FLEX SYSTEM) w/Device KIT Use to check blood glucose daily (E11.69) 11/03/19   Billie Ruddy, MD  cefdinir (OMNICEF) 300 MG capsule Take 1 capsule (300 mg total) by mouth 2 (two) times daily. 06/07/21   Burchette, Alinda Sierras, MD  Cholecalciferol (VITAMIN D) 125 MCG (5000 UT) CAPS Take 1 tablet by mouth daily.    [provider]  cyclobenzaprine (FLEXERIL) 5 MG tablet Take 1 tablet (5 mg total) by mouth 3 (three) times daily as needed. 12/27/17   Jaynee Eagles, PA-C  EPIPEN 2-PAK 0.3 MG/0.3ML SOAJ injection Inject 0.3 mLs (0.3 mg total) into the muscle as needed for anaphylaxis. 06/15/19   Billie Ruddy, MD  fexofenadine (ALLEGRA ALLERGY) 180 MG tablet Take 1 tablet (180 mg total) by mouth daily. 11/30/19   Billie Ruddy, MD  glucose blood (ONETOUCH VERIO) test strip USE AS DIRECTED TO CHECK BLOOD SUGAR UP THREE TIMES A DAY 04/04/21   Billie Ruddy, MD  hydrOXYzine (ATARAX/VISTARIL) 50 MG tablet Take 1 tablet (50 mg total) by mouth  3 (three) times daily as needed. 02/04/19   Billie Ruddy, MD  Lancets (ONETOUCH DELICA PLUS [id number]) MISC USE TO TEST ONCE DAILY 02/08/21   Billie Ruddy, MD  levothyroxine (SYNTHROID) 100 MCG tablet TAKE ONE TABLET BY MOUTH DAILY 01/13/21   Billie Ruddy, MD  omeprazole (PRILOSEC) 40 MG capsule Take 1 capsule (40 mg total) by mouth 2 (two) times a day. 12/24/18   Ladene Artist, MD  ondansetron (ZOFRAN-ODT) 4 MG disintegrating tablet Take 1 tablet (4 mg total) by mouth every 8 (eight) hours as needed for nausea or vomiting. Patient not taking: Reported on 06/07/2021 07/30/19   Davonna Belling,  MD  polyethylene glycol powder (GLYCOLAX/MIRALAX) powder Take 17 g by mouth daily. Patient not taking: Reported on 06/07/2021 12/27/17   Jaynee Eagles, PA-C  Probiotic Product (PROBIOTIC PO) Take 2 tablets by mouth daily.    [provider]  vitamin B-12 (CYANOCOBALAMIN) 500 MCG tablet Take 500 mcg by mouth daily. Patient not taking: Reported on 06/07/2021    [provider]      Allergies    Promethazine-dm, Blain Pais allergy], Codeine, Fluticasone propionate, Promethazine, and Flonase [fluticasone propionate]    Review of Systems   Review of Systems  Constitutional: Negative.   HENT: Negative.    Respiratory: Negative.    Cardiovascular:  Positive for chest pain. Negative for palpitations and leg swelling.  Gastrointestinal:  Positive for abdominal pain. Negative for blood in stool, constipation, diarrhea, nausea, rectal pain and vomiting.  Genitourinary: Negative.   Musculoskeletal: Negative.   Skin: Negative.   Neurological: Negative.   All other systems reviewed and are negative.  Physical Exam Updated Vital Signs BP 131/77    Pulse 72    Temp 98.5 F (36.9 C) (Oral)    Resp 18    LMP 03/18/2014    SpO2 99%  Physical Exam Vitals and nursing note reviewed.  Constitutional:      General: She is not in acute distress.    Appearance: She is well-developed. She is not ill-appearing, toxic-appearing or diaphoretic.  HENT:     Head: Normocephalic and atraumatic.  Eyes:     Pupils: Pupils are equal, round, and reactive to light.  Cardiovascular:     Rate and Rhythm: Normal rate.     Heart sounds: Normal heart sounds.  Pulmonary:     Effort: Pulmonary effort is normal. No respiratory distress.     Breath sounds: Normal breath sounds.     Comments: Clear Bilaterally, speaks in full sentences without difficulty Abdominal:     General: Bowel sounds are normal. There is no distension.     Palpations: Abdomen is soft.     Tenderness: There is abdominal tenderness in  the right lower quadrant and suprapubic area. There is no right CVA tenderness, left CVA tenderness, guarding or rebound. Negative signs include Murphy's sign and McBurney's sign.     Comments: Tenderness to lower abdomen right greater than left no rebound or guarding  Musculoskeletal:        General: Normal range of motion.     Cervical back: Normal range of motion.     Comments: Compartments soft.  No bony tenderness, full range of motion  Skin:    General: Skin is warm and dry.     Capillary Refill: Capillary refill takes less than 2 seconds.     Comments: No edema, erythema or warmth.  No fluctuance or induration.  Neurological:     General: No  focal deficit present.     Mental Status: She is alert.     Sensory: Sensation is intact.     Motor: Motor function is intact.     Coordination: Coordination is intact.     Gait: Gait is intact.     Comments: Cranial nerves II through XII grossly intact Full range of motion without difficulty Equal strength bilaterally  Psychiatric:        Mood and Affect: Mood normal.    ED Results / Procedures / Treatments   Labs (all labs ordered are listed, but only abnormal results are displayed) Labs Reviewed  BASIC METABOLIC PANEL  CBC  TROPONIN I (HIGH SENSITIVITY)  TROPONIN I (HIGH SENSITIVITY)    EKG None  Radiology CT Angio Chest PE W and/or Wo Contrast  Result Date: 07/05/2021 CLINICAL DATA:  Left arm pain, right leg pain, burping and abdominal pain since 9:30 a.m. EXAM: CT ANGIOGRAPHY CHEST CT ABDOMEN AND PELVIS WITH CONTRAST TECHNIQUE: Multidetector CT imaging of the chest was performed using the standard protocol during bolus administration of intravenous contrast. Multiplanar CT image reconstructions and MIPs were obtained to evaluate the vascular anatomy. Multidetector CT imaging of the abdomen and pelvis was performed using the standard protocol during bolus administration of intravenous contrast. RADIATION DOSE REDUCTION: This exam  was performed according to the departmental dose-optimization program which includes automated exposure control, adjustment of the mA and/or kV according to patient size and/or use of iterative reconstruction technique. CONTRAST:  142m OMNIPAQUE IOHEXOL 350 MG/ML SOLN COMPARISON:  07/05/2021, 07/10/2017 FINDINGS: CTA CHEST FINDINGS Cardiovascular: This is a technically adequate evaluation of the pulmonary vasculature. No filling defects or pulmonary emboli. The heart is normal in size, with trace pericardial fluid. No evidence of thoracic aortic aneurysm or dissection. Mediastinum/Nodes: No enlarged mediastinal, hilar, or axillary lymph nodes. Thyroid gland, trachea, and esophagus demonstrate no significant findings. Lungs/Pleura: No acute airspace disease, effusion, or pneumothorax. Central airways are widely patent. Musculoskeletal: No acute or destructive bony lesions. Reconstructed images demonstrate no additional findings. Review of the MIP images confirms the above findings. CT ABDOMEN and PELVIS FINDINGS Hepatobiliary: No focal liver abnormality is seen. No gallstones, gallbladder wall thickening, or biliary dilatation. Pancreas: Unremarkable. No pancreatic ductal dilatation or surrounding inflammatory changes. Spleen: Normal in size without focal abnormality. Adrenals/Urinary Tract: Kidneys enhance normally and symmetrically. No urinary tract calculi or obstructive uropathy. Bladder is decompressed, limiting its evaluation. Stomach/Bowel: No bowel obstruction or ileus. Minimal diverticulosis of the sigmoid colon without diverticulitis. Normal appendix right lower quadrant. No bowel wall thickening or inflammatory change. Vascular/Lymphatic: Mild atherosclerosis of the aorta. 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 No other significant vascular findings. No pathologic adenopathy within the abdomen or pelvis. Reproductive: Heterogeneous enlargement of  the uterus again noted, with multiple uterine fibroids. Largest measures 6.4 cm. No adnexal masses. Other: No free fluid or free gas.  No abdominal wall hernia. Musculoskeletal: No acute or destructive bony lesions. Reconstructed images demonstrate no additional findings. Review of the MIP images confirms the above findings. IMPRESSION: 1. No evidence of pulmonary embolus. 2. Trace pericardial effusion. 3. Minimal sigmoid diverticulosis without diverticulitis. 4. Fibroid uterus. 5.  Aortic Atherosclerosis (ICD10-I70.0). Electronically Signed   By: MRanda NgoM.D.   On: 07/05/2021 21:48   CT ABDOMEN PELVIS W CONTRAST  Result Date: 07/05/2021 CLINICAL DATA:  Left arm pain, right leg pain, burping and abdominal pain since 9:30 a.m. EXAM: CT ANGIOGRAPHY CHEST CT ABDOMEN AND  PELVIS WITH CONTRAST TECHNIQUE: Multidetector CT imaging of the chest was performed using the standard protocol during bolus administration of intravenous contrast. Multiplanar CT image reconstructions and MIPs were obtained to evaluate the vascular anatomy. Multidetector CT imaging of the abdomen and pelvis was performed using the standard protocol during bolus administration of intravenous contrast. RADIATION DOSE REDUCTION: This exam was performed according to the departmental dose-optimization program which includes automated exposure control, adjustment of the mA and/or kV according to patient size and/or use of iterative reconstruction technique. CONTRAST:  171m OMNIPAQUE IOHEXOL 350 MG/ML SOLN COMPARISON:  07/05/2021, 07/10/2017 FINDINGS: CTA CHEST FINDINGS Cardiovascular: This is a technically adequate evaluation of the pulmonary vasculature. No filling defects or pulmonary emboli. The heart is normal in size, with trace pericardial fluid. No evidence of thoracic aortic aneurysm or dissection. Mediastinum/Nodes: No enlarged mediastinal, hilar, or axillary lymph nodes. Thyroid gland, trachea, and esophagus demonstrate no significant  findings. Lungs/Pleura: No acute airspace disease, effusion, or pneumothorax. Central airways are widely patent. Musculoskeletal: No acute or destructive bony lesions. Reconstructed images demonstrate no additional findings. Review of the MIP images confirms the above findings. CT ABDOMEN and PELVIS FINDINGS Hepatobiliary: No focal liver abnormality is seen. No gallstones, gallbladder wall thickening, or biliary dilatation. Pancreas: Unremarkable. No pancreatic ductal dilatation or surrounding inflammatory changes. Spleen: Normal in size without focal abnormality. Adrenals/Urinary Tract: Kidneys enhance normally and symmetrically. No urinary tract calculi or obstructive uropathy. Bladder is decompressed, limiting its evaluation. Stomach/Bowel: No bowel obstruction or ileus. Minimal diverticulosis of the sigmoid colon without diverticulitis. Normal appendix right lower quadrant. No bowel wall thickening or inflammatory change. Vascular/Lymphatic: Mild atherosclerosis of the aorta. 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 No other significant vascular findings. No pathologic adenopathy within the abdomen or pelvis. Reproductive: Heterogeneous enlargement of the uterus again noted, with multiple uterine fibroids. Largest measures 6.4 cm. No adnexal masses. Other: No free fluid or free gas.  No abdominal wall hernia. Musculoskeletal: No acute or destructive bony lesions. Reconstructed images demonstrate no additional findings. Review of the MIP images confirms the above findings. IMPRESSION: 1. No evidence of pulmonary embolus. 2. Trace pericardial effusion. 3. Minimal sigmoid diverticulosis without diverticulitis. 4. Fibroid uterus. 5.  Aortic Atherosclerosis (ICD10-I70.0). Electronically Signed   By: MRanda NgoM.D.   On: 07/05/2021 21:48   DG Chest Port 1 View  Result Date: 07/05/2021 CLINICAL DATA:  Chest pain EXAM: PORTABLE CHEST 1 VIEW COMPARISON:   06/07/2021 FINDINGS: The heart size and mediastinal contours are within normal limits. Both lungs are clear. No pleural effusion or pneumothorax. The visualized skeletal structures are unremarkable. IMPRESSION: No active disease. Electronically Signed   By: PMacy MisM.D.   On: 07/05/2021 12:08    Procedures Procedures    Medications Ordered in ED Medications  acetaminophen (TYLENOL) tablet 650 mg (has no administration in time range)  iohexol (OMNIPAQUE) 350 MG/ML injection 100 mL (100 mLs Intravenous Contrast Given 07/05/21 2118)    ED Course/ Medical Decision Making/ A&P    56year old here for evaluation of multiple complaints.  She is afebrile, nonseptic, not ill-appearing.  Today developed some suprapubic, right lower quadrant pain.  No associated urinary symptoms.  She is postmenopausal.  Denies any concerns for any STDs.  Denies any overt pelvic pain.  Also has had some aching chest pain which began earlier today.  States she feels generally unwell.  She still has productive cough post COVID 1 month ago.  No history  of PE or DVT.  She feels like her left arm is swollen and aching.  She denies any recent surgery, immobilization or malignancy.  Not currently anticoagulated.  She is neurovascularly intact.  She has no appreciable edema, erythema to her extremities on exam.  Plan on labs, imaging and reassess  Labs and imaging personally reviewed and interpreted:  CBC without leukocytosis 9 metabolic panel without electrolyte, renal normality Troponin neg, flat CTA chest trace pericardial effusion, will have fu with cards ATCP without acute abnormality EKG without significant abnormality  DG chest without acute abnormality  Patient reassessed.  Discussed labs and imaging.  We will have her follow-up outpatient for reassessment.  At this time I low suspicion for acute ACS, PE, dissection, bacterial infectious process, unstable angina, large effusion.  I will follow-up with cards, PCP.   Patient agreeable  The patient has been appropriately medically screened and/or stabilized in the ED. I have low suspicion for any other emergent medical condition which would require further screening, evaluation or treatment in the ED or require inpatient management.  Patient is hemodynamically stable and in no acute distress.  Patient able to ambulate in department prior to ED.  Evaluation does not show acute pathology that would require ongoing or additional emergent interventions while in the emergency department or further inpatient treatment.  I have discussed the diagnosis with the patient and answered all questions.  Pain is been managed while in the emergency department and patient has no further complaints prior to discharge.  Patient is comfortable with plan discussed in room and is stable for discharge at this time.  I have discussed strict return precautions for returning to the emergency department.  Patient was encouraged to follow-up with PCP/specialist refer to at discharge.                            Medical Decision Making Amount and/or Complexity of Data Reviewed External Data Reviewed: labs, radiology and notes. Labs: ordered. Decision-making details documented in ED Course. Radiology: ordered and independent interpretation performed. Decision-making details documented in ED Course. ECG/medicine tests: ordered and independent interpretation performed. Decision-making details documented in ED Course.  Risk OTC drugs. Prescription drug management. Risk Details: Do not feel needs any additional labs, imaging or hospitalization at this time         Final Clinical Impression(s) / ED Diagnoses Final diagnoses:  Precordial pain  Lower abdominal pain    Rx / DC Orders ED Discharge Orders          Ordered    US Venous Img Upper Uni Left (DVT)        07/05/21 2200              Mazie Fencl A, PA-C 07/05/21 2200    Drenda Freeze, MD 07/05/21  2240

## 2021-07-06 ENCOUNTER — Other Ambulatory Visit: Payer: Self-pay

## 2021-07-06 ENCOUNTER — Other Ambulatory Visit (HOSPITAL_COMMUNITY): Payer: Self-pay | Admitting: Physician Assistant

## 2021-07-06 DIAGNOSIS — M79601 Pain in right arm: Secondary | ICD-10-CM

## 2021-07-07 ENCOUNTER — Encounter: Payer: Self-pay | Admitting: Vascular Surgery

## 2021-07-07 ENCOUNTER — Other Ambulatory Visit: Payer: Self-pay

## 2021-07-07 ENCOUNTER — Ambulatory Visit (HOSPITAL_COMMUNITY)
Admission: RE | Admit: 2021-07-07 | Discharge: 2021-07-07 | Disposition: A | Discharge status: Home / Self Care | Payer: BC Managed Care – PPO | Source: Ambulatory Visit | Attending: Vascular Surgery | Admitting: Vascular Surgery

## 2021-07-07 ENCOUNTER — Ambulatory Visit: Payer: BC Managed Care – PPO | Admitting: Vascular Surgery

## 2021-07-07 VITALS — BP 116/77 | HR 84 | Temp 97.9°F | Resp 20 | Ht 63.0 in | Wt 194.0 lb

## 2021-07-07 DIAGNOSIS — M79602 Pain in left arm: Secondary | ICD-10-CM | POA: Diagnosis not present

## 2021-07-07 DIAGNOSIS — M7989 Other specified soft tissue disorders: Secondary | ICD-10-CM

## 2021-07-07 DIAGNOSIS — M79601 Pain in right arm: Secondary | ICD-10-CM | POA: Diagnosis present

## 2021-07-07 NOTE — Progress Notes (Signed)
Office Note     CC: Left arm swelling Requesting Provider:  Billie Ruddy, MD  HPI: Marilyn [name] is a 56 y.o. (02-03-1966) female presenting at the request of .Billie Ruddy, MD for left arm swelling.  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  Swelling was accompanied by some tingling in her fingertips, which was concerning.  She immediately presented to the [facility], ED where she was seen and evaluated the evening.  Unfortunately, there was no ultrasonography available, she was asked to return to the hospital the following day.  At that time, the order was not placed correctly, and she presented to my office for further evaluation.  On exam, Marilyn [name] was doing well.  She was very concerned for possible deep venous thrombosis.  The swelling in her left arm had decreased significantly.  She denied sensorimotor deficits in the hand.  She had never had an episode like this previously, but has had aches and pains involving all extremities in the past.  She was not aware of any trigger, nor any intervention that improved the edema.  Marilyn [name] continues to feel as though she has a light blood pressure cuff on her left bicep.  The pt is  on a statin for cholesterol management.  The pt is  on a daily aspirin.   Other AC:  - The pt is  on medication for hypertension.   The pt is  diabetic.  Tobacco hx:  former  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   Neck pain 09/09/2011   Obesity 04/11/2014     Past Surgical History:  Procedure Laterality Date   BREAST BIOPSY Right 12/2014   BREAST EXCISIONAL BIOPSY Right 2016   BREAST LUMPECTOMY WITH RADIOACTIVE SEED LOCALIZATION Right 02/25/2015   Procedure: BREAST LUMPECTOMY WITH RADIOACTIVE SEED LOCALIZATION;  Surgeon: Alphonsa Overall, MD;  Location: [place];  Service: General;  Laterality: Right;   BREAST REDUCTION SURGERY  1986   BREAST SURGERY     broken jaw     CARPAL TUNNEL RELEASE  2001   FRACTURE SURGERY     REDUCTION MAMMAPLASTY Bilateral    THYROIDECTOMY     TUBAL LIGATION  1987    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[employer]    Employer: [employer]  Tobacco Use   Smoking status: Former    Packs/day: 0.25    Years: 3.00    Pack years: 0.75    Types: Cigarettes    Quit date: 06/04/1980    Years since quitting: 41.1   Smokeless tobacco: Never  Vaping Use   Vaping Use: Never used  Substance and Sexual Activity   Alcohol use:  No    Alcohol/week: 0.0 standard drinks   Drug use: No   Sexual activity: Not Currently    Comment: 1st intercourse 12 yo-5 partners  Other Topics Concern   Not on file  Social History Narrative   Married   Social Determinants of Health   Financial Resource Strain: Not on file  Food Insecurity: Not on file  Transportation Needs: Not on file  Physical Activity: Not on file  Stress: Not on file  Social Connections: Not on file  Intimate Partner Violence: Not on file    Family History  Problem Relation Age of Onset   Hypertension Father    Diabetes Father    Hyperlipidemia Father    Asthma Mother    Breast cancer Sister 45   Thyroid disease Sister    Diabetes Maternal Grandmother    Heart disease Maternal Grandmother    Heart attack Maternal Grandmother 60    Stroke Maternal Grandmother    Asthma Maternal Grandfather    Heart attack Maternal Grandfather    Stroke Maternal Grandfather    Colon cancer Neg Hx     Current Outpatient Medications  Medication Sig Dispense Refill   acetaminophen (TYLENOL) 500 MG tablet Take 500 mg by mouth as needed for moderate pain (arthritis pain).     albuterol (VENTOLIN HFA) 108 (90 Base) MCG/ACT inhaler Inhale 2 puffs into the lungs every 4 (four) hours as needed for wheezing or shortness of breath (cough, shortness of breath or wheezing.). 6.7 g 1   amLODipine (NORVASC) 10 MG tablet TAKE ONE TABLET BY MOUTH DAILY 55 tablet 1   aspirin EC 81 MG tablet Take 1 tablet (81 mg total) by mouth daily. Swallow whole. 90 tablet 3   atorvastatin (LIPITOR) 20 MG tablet Take 1 tablet (20 mg total) by mouth daily. 90 tablet 3   Beclomethasone Dipropionate 40 MCG/ACT AERS Place 1 spray into the nose in the morning and at bedtime. 6.8 g 1   benzonatate (TESSALON PERLES) 100 MG capsule Take 1 capsule (100 mg total) by mouth 3 (three) times daily as needed for cough. 30 capsule 1   Blood Glucose Monitoring Suppl (ONETOUCH VERIO FLEX SYSTEM) w/Device KIT Use to check blood glucose daily (E11.69) 1 kit 0   Cholecalciferol (VITAMIN D) 125 MCG (5000 UT) CAPS Take 1 tablet by mouth daily.     cyclobenzaprine (FLEXERIL) 5 MG tablet Take 1 tablet (5 mg total) by mouth 3 (three) times daily as needed. 90 tablet 1   EPIPEN 2-PAK 0.3 MG/0.3ML SOAJ injection Inject 0.3 mLs (0.3 mg total) into the muscle as needed for anaphylaxis. 1 each 0   fexofenadine (ALLEGRA ALLERGY) 180 MG tablet Take 1 tablet (180 mg total) by mouth daily. 30 tablet 3   glucose blood (ONETOUCH VERIO) test strip USE AS DIRECTED TO CHECK BLOOD SUGAR UP THREE TIMES A DAY 100 strip 1   hydrOXYzine (ATARAX/VISTARIL) 50 MG tablet Take 1 tablet (50 mg total) by mouth 3 (three) times daily as needed. 30 tablet 0   Lancets (ONETOUCH DELICA PLUS [id number]) MISC USE TO TEST ONCE  DAILY 100 each 2   levothyroxine (SYNTHROID) 100 MCG tablet TAKE ONE TABLET BY MOUTH DAILY 90 tablet 1   omeprazole (PRILOSEC) 40 MG capsule Take 1 capsule (40 mg total) by mouth 2 (two) times a day. 60 capsule 11   Probiotic Product (PROBIOTIC PO) Take 2 tablets by mouth daily.     ondansetron (ZOFRAN-ODT) 4 MG disintegrating tablet Take 1 tablet (  4 mg total) by mouth every 8 (eight) hours as needed for nausea or vomiting. (Patient not taking: Reported on 06/07/2021) 8 tablet 0   polyethylene glycol powder (GLYCOLAX/MIRALAX) powder Take 17 g by mouth daily. (Patient not taking: Reported on 06/07/2021) 500 g 1   vitamin B-12 (CYANOCOBALAMIN) 500 MCG tablet Take 500 mcg by mouth daily. (Patient not taking: Reported on 06/07/2021)     No current facility-administered medications for this visit.    Allergies  Allergen Reactions   Promethazine-Dm    Geralyn Flash [Fish Allergy] Anaphylaxis   Codeine Nausea And Vomiting   Fluticasone Propionate Other (See Comments)   Promethazine Other (See Comments)   Flonase [Fluticasone Propionate] Other (See Comments)    Nose bleeds     REVIEW OF SYSTEMS:   '[X]'  denotes positive finding, '[ ]'  denotes negative finding Cardiac  Comments:  Chest pain or chest pressure:    Shortness of breath upon exertion:    Short of breath when lying flat:    Irregular heart rhythm:        Vascular    Pain in calf, thigh, or hip brought on by ambulation:    Pain in feet at night that wakes you up from your sleep:     Blood clot in your veins:    Leg swelling:         Pulmonary    Oxygen at home:    Productive cough:     Wheezing:         Neurologic    Sudden weakness in arms or legs:     Sudden numbness in arms or legs:     Sudden onset of difficulty speaking or slurred speech:    Temporary loss of vision in one eye:     Problems with dizziness:         Gastrointestinal    Blood in stool:     Vomited blood:         Genitourinary    Burning when urinating:      Blood in urine:        Psychiatric    Major depression:         Hematologic    Bleeding problems:    Problems with blood clotting too easily:        Skin    Rashes or ulcers:        Constitutional    Fever or chills:      PHYSICAL EXAMINATION:  Vitals:   07/07/21 1122  BP: 116/77  Pulse: 84  Resp: 20  Temp: 97.9 F (36.6 C)  SpO2: 98%  Weight: 194 lb (88 kg)  Height: '5\' 3"'  (1.6 m)    General:  WDWN in NAD; vital signs documented above Gait: Not observed HENT: WNL, normocephalic Pulmonary: normal non-labored breathing , without wheezing Cardiac: regular HR,  Abdomen: soft, NT, no masses Skin: without rashes Vascular Exam/Pulses:  Right Left  Radial 2+ (normal) 2+ (normal)  Ulnar 2+ (normal) 2+ (normal)          DP 2+ (normal) 2+ (normal)  PT 2+ (normal) 2+ (normal)   Extremities: without ischemic changes, without Gangrene , without cellulitis; without open wounds;  Musculoskeletal: no muscle wasting or atrophy  Neurologic: A&O X 3;  No focal weakness or paresthesias are detected Psychiatric:  The pt has Normal affect.   Non-Invasive Vascular Imaging:    Left:  No evidence of deep vein thrombosis in the upper extremity. No evidence of  superficial vein  thrombosis in the upper extremity.    ASSESSMENT/PLAN: Sumiye Hirth is a 56 y.o. female presenting with transient left upper extremity swelling, which has improved - now symmetric in size.  She continues to have some tightness in the bicep region.  Noninvasive duplex ultrasonography of the left arm venous system was negative for deep venous thrombosis.  On physical exam, the patient had a palpable radial and ulnar pulse.  Ariyanah appears to have normal arterial flow to the hand as well as normal venous return.  No deep venous thrombosis.  Provocative maneuvers did not elicit pain.  I am unsure as to the etiology for the swelling as well as the tightness in the bicep region, however it is not from a  vascular etiology.  Adisson's left arm continues to improve.  She was very happy that she did not have a DVT. I told Aralyn I am happy to see her should she have any further issues, however at this time her vasculature is normal.   Broadus John, MD [facility] [phone]  Greater than 45 minutes was spent with pre-charting, patient interview, post-interview documentation.

## 2021-07-10 ENCOUNTER — Other Ambulatory Visit: Payer: Self-pay

## 2021-07-10 ENCOUNTER — Encounter: Payer: Self-pay | Admitting: Family Medicine

## 2021-07-10 ENCOUNTER — Other Ambulatory Visit: Payer: Self-pay | Admitting: Family Medicine

## 2021-07-10 ENCOUNTER — Ambulatory Visit: Payer: BC Managed Care – PPO | Admitting: Family Medicine

## 2021-07-10 VITALS — BP 122/80 | HR 85 | Temp 98.7°F | Ht 63.0 in | Wt 198.0 lb

## 2021-07-10 DIAGNOSIS — E119 Type 2 diabetes mellitus without complications: Secondary | ICD-10-CM

## 2021-07-10 DIAGNOSIS — I700 Atherosclerosis of aorta: Secondary | ICD-10-CM

## 2021-07-10 DIAGNOSIS — R600 Localized edema: Secondary | ICD-10-CM

## 2021-07-10 DIAGNOSIS — Z1231 Encounter for screening mammogram for malignant neoplasm of breast: Secondary | ICD-10-CM

## 2021-07-10 DIAGNOSIS — E890 Postprocedural hypothyroidism: Secondary | ICD-10-CM

## 2021-07-10 DIAGNOSIS — K219 Gastro-esophageal reflux disease without esophagitis: Secondary | ICD-10-CM

## 2021-07-10 DIAGNOSIS — R1310 Dysphagia, unspecified: Secondary | ICD-10-CM

## 2021-07-10 DIAGNOSIS — R202 Paresthesia of skin: Secondary | ICD-10-CM

## 2021-07-10 DIAGNOSIS — M7989 Other specified soft tissue disorders: Secondary | ICD-10-CM | POA: Diagnosis not present

## 2021-07-10 DIAGNOSIS — I3139 Other pericardial effusion (noninflammatory): Secondary | ICD-10-CM | POA: Diagnosis not present

## 2021-07-10 DIAGNOSIS — R682 Dry mouth, unspecified: Secondary | ICD-10-CM

## 2021-07-10 MED ORDER — ONETOUCH VERIO FLEX SYSTEM W/DEVICE KIT: PACK | 0 refills | Status: DC

## 2021-07-10 NOTE — Progress Notes (Signed)
Subjective:    Patient ID: Marilyn [name], female    DOB: 1966/01/25, 56 y.o.   MRN: [id number]  Chief Complaint  Patient presents with   Hospitalization Follow-up    HPI Patient was seen today for ED f/u and numerous complaints.  Pt seen in ED on 07/05/21 for L arm pain and edema, and CP.  EKG normal.  CT abd and pelvis and CT angio done; trace pericardial effusion, no evidence of PE, minimal sigmoid diverticulosis without diverticulitis uterine fibroids, atherosclerosis noted.  Patient was advised to follow-up with cardiology.  Ultrasound of left UE ordered and negative per Vascular.   Pt states the edema in her arm is improving.  At the time it started she felt tightness in her upper arm.  Ever since pt has noticed discoloration of fingers on left hand and mild numbness/tingling.  Pt has been having increased burning in mid chest and tightness in upper throat.  Was unsure if symptoms were heart burn related.  Started taking an herbal heart burn medication.  Having a squeezing like chest pain/pressure randomly.  Has noticed intermittent edema of lateral  R face.  Pt states rubbing her R ear causes a deep, sharp, stabbing sensation.  Patient denies parotid stones, grinding teeth at night, pain with opening jaw, increased ear pressure.  Endorses dry mouth, dry eyes.  Has to schedule appointment with eye doctor.  Also feels like R hand is swollen.   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   ASTHMA 01/30/2007   Asthma    Breast tumor    right; benign   Chest pain 01/24/2017   CHEST PAIN 01/29/2009   Qualifier: Diagnosis of  By: Silvio Pate MD, [facility]    GERD 01/31/2007   Hemorrhoids    Hiatal hernia    HTN (hypertension) 09/09/2011   Hyperlipidemia    Hypersomnolence 12/15/2012   HYPOTHYROIDISM 01/30/2007   Mallory - Mariel Kansky tear 04/2003   Neck pain 09/09/2011   Obesity 04/11/2014    Allergies  Allergen Reactions   Promethazine-Dm    Geralyn Flash  [Fish Allergy] Anaphylaxis   Codeine Nausea And Vomiting   Fluticasone Propionate Other (See Comments)   Promethazine Other (See Comments)   Flonase [Fluticasone Propionate] Other (See Comments)    Nose bleeds    ROS General: Denies fever, chills, night sweats, changes in weight, changes in appetite HEENT: Denies headaches, ear pain, changes in vision, rhinorrhea, sore throat +dry mouth, dry eyes, dysphagia CV: Denies CP, palpitations, SOB, orthopnea    +intermittent CP Pulm: Denies SOB, cough, wheezing GI: Denies abdominal pain, nausea, vomiting, diarrhea, constipation +GERD GU: Denies dysuria, hematuria, frequency, vaginal discharge Msk: Denies muscle cramps, joint pains Neuro: Denies weakness +numbness, tingling in L hand Skin: Denies rashes, bruising +edema of LUE, Edema of R lateral face, edema of R hand Psych: Denies depression, anxiety, hallucinations     Objective:    Blood pressure 122/80, pulse 85, temperature 98.7 F (37.1 C), temperature source Oral, height 5\' 3"  (1.6 m), weight 198 lb (89.8 kg), last menstrual period 03/18/2014, SpO2 99 %.  Gen. Pleasant, well-nourished, in no distress, normal affect   HEENT: [place]/AT, face symmetric, conjunctiva clear, no scleral icterus, PERRLA, EOMI, nares patent without drainage, pharynx without erythema or exudate.  R lateral face/parotid gland/preauricular area with edema and TTP or R ear.  No edema of R ear.  No masses in anterior neck. Lungs: no accessory muscle use, CTAB, no wheezes  or rales Cardiovascular: RRR, no m/r/g, no peripheral edema Musculoskeletal: No deformities, no cyanosis or clubbing, normal tone Neuro:  A&Ox3, CN II-XII intact, normal gait Skin:  Warm, no lesions/ rash.  L bicep with soft tissue edema, non mobile without erythema or induration.  Edema of right lateral face/right preauricular area.  Well-healed surgical incision at base of anterior neck.  Wt Readings from Last 3 Encounters:  07/10/21 198 lb (89.8  kg)  07/07/21 194 lb (88 kg)  06/07/21 191 lb 11.2 oz (87 kg)    Lab Results  Component Value Date   WBC 5.3 07/05/2021   HGB 13.4 07/05/2021   HCT 41.3 07/05/2021   PLT 250 07/05/2021   GLUCOSE 95 07/05/2021   CHOL 157 02/20/2021   TRIG 65 02/20/2021   HDL 46 02/20/2021   LDLDIRECT 165.3 12/15/2012   LDLCALC 98 02/20/2021   ALT 24 02/20/2021   AST 30 02/20/2021   NA 138 07/05/2021   K 4.0 07/05/2021   CL 102 07/05/2021   CREATININE 0.80 07/05/2021   BUN 14 07/05/2021   CO2 29 07/05/2021   TSH 0.52 03/04/2020   HGBA1C 5.6 12/30/2019   MICROALBUR 1.0 10/29/2019    Assessment/Plan:  Swelling of upper arm -Mild soft tissue edema of left bicep -Reassured as ultrasound negative for thrombosis -Supportive care including ice -Continue to monitor  - Plan: CBC with Differential/Platelet  Gastroesophageal reflux disease, unspecified whether esophagitis present -Discussed restarting PPI-omeprazole.  Patient wishes to use OTC herbal antacid. -For continued or worsening symptoms consider PPI and follow-up with GI - Plan: CMP  Dysphagia, unspecified type  -Possibly 2/2 increased heartburn symptoms -Discussed antacid. -For continued symptoms follow-up with GI - Plan: CBC with Differential/Platelet  Pericardial effusion -Asymptomatic on exam -Trace pericardial effusion noted on CTA chest on 07/05/2021. - Plan: Ambulatory referral to Cardiology, CBC with Differential/Platelet  Facial edema  -discussed possible causes including parotitis, parotid gland stone, eustachian tube dysfunction - Plan: Sedimentation Rate, CBC with Differential/Platelet, Lupus (SLE) Analysis, Sjogren's syndrome antibods(ssa + ssb)  Dry mouth  - Plan: Sedimentation Rate, Sjogren's syndrome antibods(ssa + ssb)  Postoperative hypothyroidism  -continue synthroid 100 mcg daily - Plan: TSH  Diet-controlled type 2 diabetes mellitus ([place])  -hgb A1C 5.6% on 12/30/19 - Plan: Hemoglobin A1c,  Microalbumin/Creatinine Ratio, Urine  Paresthesia  - Plan: Vitamin B12  Aortic atherosclerosis (HCC) -noted on CT angio 07/05/21 -lifestyle modifications -continue lipitor 20 mg   F/u prn in the next few weeks for CPE  More than 50% of over 36-40 minutes spent in total in caring for this patient was spent face-to-face, reviewing the chart, counseling and/or coordinating care.   Grier Mitts, MD

## 2021-07-11 ENCOUNTER — Other Ambulatory Visit (INDEPENDENT_AMBULATORY_CARE_PROVIDER_SITE_OTHER): Payer: BC Managed Care – PPO

## 2021-07-11 ENCOUNTER — Telehealth: Payer: Self-pay | Admitting: Gastroenterology

## 2021-07-11 DIAGNOSIS — E119 Type 2 diabetes mellitus without complications: Secondary | ICD-10-CM

## 2021-07-11 DIAGNOSIS — R202 Paresthesia of skin: Secondary | ICD-10-CM

## 2021-07-11 DIAGNOSIS — R682 Dry mouth, unspecified: Secondary | ICD-10-CM

## 2021-07-11 DIAGNOSIS — K219 Gastro-esophageal reflux disease without esophagitis: Secondary | ICD-10-CM

## 2021-07-11 DIAGNOSIS — R600 Localized edema: Secondary | ICD-10-CM | POA: Diagnosis not present

## 2021-07-11 DIAGNOSIS — R1310 Dysphagia, unspecified: Secondary | ICD-10-CM

## 2021-07-11 DIAGNOSIS — I3139 Other pericardial effusion (noninflammatory): Secondary | ICD-10-CM

## 2021-07-11 DIAGNOSIS — E890 Postprocedural hypothyroidism: Secondary | ICD-10-CM

## 2021-07-11 DIAGNOSIS — M7989 Other specified soft tissue disorders: Secondary | ICD-10-CM

## 2021-07-11 LAB — VITAMIN B12: Vitamin B-12: 1266 pg/mL — ABNORMAL HIGH (ref 211–911)

## 2021-07-11 LAB — CBC WITH DIFFERENTIAL/PLATELET
Basophils Absolute: 0 10*3/uL (ref 0.0–0.1)
Basophils Relative: 0.4 % (ref 0.0–3.0)
Eosinophils Absolute: 0.1 10*3/uL (ref 0.0–0.7)
Eosinophils Relative: 1.8 % (ref 0.0–5.0)
HCT: 37.6 % (ref 36.0–46.0)
Hemoglobin: 12.4 g/dL (ref 12.0–15.0)
Lymphocytes Relative: 34.9 % (ref 12.0–46.0)
Lymphs Abs: 2.1 10*3/uL (ref 0.7–4.0)
MCHC: 33 g/dL (ref 30.0–36.0)
MCV: 82.2 fl (ref 78.0–100.0)
Monocytes Absolute: 0.4 10*3/uL (ref 0.1–1.0)
Monocytes Relative: 7.4 % (ref 3.0–12.0)
Neutro Abs: 3.3 10*3/uL (ref 1.4–7.7)
Neutrophils Relative %: 55.5 % (ref 43.0–77.0)
Platelets: 234 10*3/uL (ref 150.0–400.0)
RBC: 4.58 Mil/uL (ref 3.87–5.11)
RDW: 13.5 % (ref 11.5–15.5)
WBC: 5.9 10*3/uL (ref 4.0–10.5)

## 2021-07-11 LAB — COMPREHENSIVE METABOLIC PANEL
ALT: 23 U/L (ref 0–35)
AST: 22 U/L (ref 0–37)
Albumin: 4.5 g/dL (ref 3.5–5.2)
Alkaline Phosphatase: 62 U/L (ref 39–117)
BUN: 15 mg/dL (ref 6–23)
CO2: 32 mEq/L (ref 19–32)
Calcium: 9.4 mg/dL (ref 8.4–10.5)
Chloride: 100 mEq/L (ref 96–112)
Creatinine, Ser: 0.8 mg/dL (ref 0.40–1.20)
GFR: 81.69 mL/min (ref >60.00)
Glucose, Bld: 87 mg/dL (ref 70–99)
Potassium: 3.8 mEq/L (ref 3.5–5.1)
Sodium: 137 mEq/L (ref 135–145)
Total Bilirubin: 0.3 mg/dL (ref 0.2–1.2)
Total Protein: 8.4 g/dL — ABNORMAL HIGH (ref 6.0–8.3)

## 2021-07-11 LAB — MICROALBUMIN / CREATININE URINE RATIO
Creatinine,U: 30.1 mg/dL
Microalb Creat Ratio: 2.3 mg/g (ref 0.0–30.0)
Microalb, Ur: <0.7 mg/dL (ref 0.0–1.9)

## 2021-07-11 LAB — TSH: TSH: 0.64 u[IU]/mL (ref 0.35–5.50)

## 2021-07-11 LAB — SEDIMENTATION RATE: Sed Rate: 54 mm/hr — ABNORMAL HIGH (ref 0–30)

## 2021-07-11 LAB — HEMOGLOBIN A1C: Hgb A1c MFr Bld: 6.4 % (ref 4.6–6.5)

## 2021-07-11 NOTE — Telephone Encounter (Signed)
Patient called in complaining that her acid reflux has started to get worse. She is out of refills of omeprazole, and has not taken any since November. She would like to know if she needs to be seen first for an OV, and if it is okay to take "Acidil" plant based heartburn relief that she purchased OTC. Please advise, thank you!

## 2021-07-11 NOTE — Telephone Encounter (Signed)
Left message on machine to call back  

## 2021-07-11 NOTE — Addendum Note (Signed)
Addended by: Sandford Craze on: 07/11/2021 01:14 PM   Modules accepted: Orders

## 2021-07-12 ENCOUNTER — Other Ambulatory Visit: Payer: Self-pay

## 2021-07-12 LAB — LUPUS (SLE) ANALYSIS
Anti Nuclear Antibody (ANA): NEGATIVE
Anti-striation Abs: NEGATIVE
Complement C4, Serum: 50 mg/dL — ABNORMAL HIGH (ref 12–38)
ENA RNP Ab: 0.6 AI (ref 0.0–0.9)
ENA SM Ab Ser-aCnc: <0.2 AI (ref 0.0–0.9)
ENA SSA (RO) Ab: <0.2 AI (ref 0.0–0.9)
ENA SSB (LA) Ab: <0.2 AI (ref 0.0–0.9)
Mitochondrial Ab: <20 Units (ref 0.0–20.0)
Parietal Cell Ab: 1.9 Units (ref 0.0–20.0)
Scleroderma (Scl-70) (ENA) Antibody, IgG: <0.2 AI (ref 0.0–0.9)
Smooth Muscle Ab: 9 Units (ref 0–19)
Thyroperoxidase Ab SerPl-aCnc: <9 IU/mL (ref 0–34)
dsDNA Ab: <1 IU/mL (ref 0–9)

## 2021-07-12 MED ORDER — OMEPRAZOLE 40 MG PO CPDR: 40.0000 mg | DELAYED_RELEASE_CAPSULE | Freq: Two times a day (BID) | ORAL | 11 refills | Status: DC

## 2021-07-12 NOTE — Telephone Encounter (Signed)
Patient is aware that her omeprazole has been refilled, and to follow up with [place] if her symptoms are no better so we can get her set up with an OV.

## 2021-07-13 ENCOUNTER — Other Ambulatory Visit: Payer: Self-pay | Admitting: Family Medicine

## 2021-07-13 DIAGNOSIS — I10 Essential (primary) hypertension: Secondary | ICD-10-CM

## 2021-07-13 DIAGNOSIS — E890 Postprocedural hypothyroidism: Secondary | ICD-10-CM

## 2021-07-13 LAB — SJOGREN'S SYNDROME ANTIBODS(SSA + SSB)
SSA (Ro) (ENA) Antibody, IgG: <1 AI
SSB (La) (ENA) Antibody, IgG: <1 AI

## 2021-07-14 ENCOUNTER — Ambulatory Visit (INDEPENDENT_AMBULATORY_CARE_PROVIDER_SITE_OTHER): Payer: BC Managed Care – PPO | Admitting: Family Medicine

## 2021-07-14 ENCOUNTER — Encounter: Payer: Self-pay | Admitting: Family Medicine

## 2021-07-14 VITALS — BP 114/80 | HR 106 | Ht 63.5 in | Wt 199.0 lb

## 2021-07-14 DIAGNOSIS — R600 Localized edema: Secondary | ICD-10-CM | POA: Diagnosis not present

## 2021-07-14 DIAGNOSIS — J020 Streptococcal pharyngitis: Secondary | ICD-10-CM

## 2021-07-14 DIAGNOSIS — J029 Acute pharyngitis, unspecified: Secondary | ICD-10-CM | POA: Diagnosis not present

## 2021-07-14 DIAGNOSIS — Z0001 Encounter for general adult medical examination with abnormal findings: Secondary | ICD-10-CM

## 2021-07-14 DIAGNOSIS — R779 Abnormality of plasma protein, unspecified: Secondary | ICD-10-CM

## 2021-07-14 DIAGNOSIS — Z0000 Encounter for general adult medical examination without abnormal findings: Secondary | ICD-10-CM

## 2021-07-14 DIAGNOSIS — R899 Unspecified abnormal finding in specimens from other organs, systems and tissues: Secondary | ICD-10-CM | POA: Diagnosis not present

## 2021-07-14 LAB — POCT RAPID STREP A (OFFICE): Rapid Strep A Screen: POSITIVE — AB

## 2021-07-14 MED ORDER — AMOXICILLIN-POT CLAVULANATE 500-125 MG PO TABS: 1.0000 | ORAL_TABLET | Freq: Two times a day (BID) | ORAL | 0 refills | Status: DC

## 2021-07-14 MED ORDER — AMOXICILLIN-POT CLAVULANATE 250-62.5 MG/5ML PO SUSR: 500.0000 mg | Freq: Two times a day (BID) | ORAL | 0 refills | Status: AC

## 2021-07-14 NOTE — Progress Notes (Signed)
Subjective:     Marilyn [name] is a 56 y.o. female and is here for a comprehensive physical exam. The patient reports intermittent drainage and fullness in right ear.  States drainage is clearish yellow in color.  Endorses continued right side of face edema that has gone down some since last OFV.  Patient notes mild sore throat a few days ago that is more scratchy today.  Patient denies fever, chills rhinorrhea, nausea, vomiting.  Recent labs reviewed.  Patient mentions plans to cut down on intake of carbohydrates as she loves bread and crackers.  Patient has mammogram scheduled 07/17/2021.   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[employer]    Employer: [employer]  Tobacco Use   Smoking status: Former    Packs/day: 0.25    Years: 3.00    Pack years: 0.75    Types: Cigarettes    Quit date: 06/04/1980    Years since quitting: 41.1   Smokeless tobacco: Never  Vaping Use   Vaping Use: Never used  Substance and Sexual Activity   Alcohol use: No    Alcohol/week: 0.0 standard drinks   Drug use: No   Sexual activity: Not Currently    Comment: 1st intercourse 12 yo-5 partners  Other Topics Concern   Not on file  Social History Narrative   Married   Social Determinants of Health   Financial Resource Strain: Not on file  Food Insecurity: Not on file  Transportation Needs: Not on file  Physical Activity: Not on file  Stress: Not on file  Social Connections: Not on file  Intimate Partner Violence: Not on file   Health Maintenance  Topic Date Due   Hepatitis C Screening  Never done   Zoster Vaccines- Shingrix (1 of 2) Never done   TETANUS/TDAP  12/23/2019   PAP SMEAR-Modifier  02/21/2020   FOOT EXAM  10/28/2020   COLONOSCOPY (Pts 45-31yrs Insurance coverage will need to be  confirmed)  12/24/2020   INFLUENZA VACCINE  01/02/2021   COVID-19 Vaccine (1) 03/23/2022 (Originally 06/06/1966)   OPHTHALMOLOGY EXAM  10/19/2021   HEMOGLOBIN A1C  01/08/2022   MAMMOGRAM  07/01/2022   URINE MICROALBUMIN  07/11/2022   HIV Screening  Completed   HPV VACCINES  Aged Out    The following portions of the patient's history were reviewed and updated as appropriate: allergies, current medications, past family history, past medical history, past social history, past surgical history, and problem list.  Review of Systems Pertinent items noted in HPI and remainder of comprehensive ROS otherwise negative.   Objective:    BP 114/80    Pulse (!) 106    Ht 5' 3.5" (1.613 m)    Wt 199 lb (90.3 kg)    LMP 03/18/2014    SpO2 96%    BMI 34.70 kg/m  General appearance: alert, cooperative, and no distress Head: Normocephalic, without obvious abnormality, atraumatic Eyes: conjunctivae/corneas clear. PERRL, EOM's intact. Fundi benign. Ears: normal TM's and external ear canals both ears Nose: Nares normal. Septum midline. Mucosa normal. No drainage or sinus tenderness. Throat: lips, mucosa, and tongue normal; teeth and gums normal and mildly erythematous pharynx with exudate on right tonsil. Neck: no adenopathy, no carotid bruit, no JVD, supple, symmetrical, trachea midline,  and thyroid not enlarged, symmetric, no tenderness/mass/nodules Lungs: clear to auscultation bilaterally Heart: regular rate and rhythm, S1, S2 normal, no murmur, click, rub or gallop Abdomen: soft, non-tender; bowel sounds normal; no masses,  no organomegaly Extremities: extremities normal, atraumatic, no cyanosis or edema Pulses: 2+ and symmetric Skin: Skin color, texture, turgor normal. No rashes or lesions Lymph nodes: Cervical, supraclavicular, and axillary nodes normal. Neurologic: Alert and oriented X 3, normal strength and tone. Normal symmetric reflexes. Normal coordination and gait    Assessment:    Healthy  female exam with AOM   Plan:    Anticipatory guidance given including wearing seatbelts, smoke detectors in the home, increasing physical activity, increasing p.o. intake of water and vegetables. -Labs obtained from 07/11/2021 visit reviewed with patient -Mammogram scheduled next week -Colonoscopy due.  Last done 12/25/2010.  Patient encouraged to schedule this. -Immunizations reviewed -Given handout -Next CPE in 1 year See After Visit Summary for Counseling Recommendations   Sore throat  - Plan: POC Rapid Strep A  Strep pharyngitis -Positive rapid strep test in office -Start antibiotic -Tylenol or ibuprofen for any pain/discomfort, gargling with warm salt water or Chloraseptic spray. - Plan: amoxicillin-clavulanate (AUGMENTIN) 250-62.5 MG/5ML suspension  Elevated blood protein -Total protein mildly elevated at 8.4 on 07/11/2021. - Plan: Protein Electrophoresis, Serum  Abnormal laboratory test result -C4 complement elevated at 50 and sed rate elevated at 54 on labs from 07/11/2021. -Patient would like to proceed with rheumatology appointment for further evaluation. - Plan: Ambulatory referral to Rheumatology  Facial edema -Chronic/intermittent.  Improving since last OFV on 07/11/2021 -Discussed possible causes including parotiditis, sialodochitis, infection, or even malignancy. -Given continued symptoms will place referral to ENT -Plan: Ambulatory referral to ENT  F/u prn  Grier Mitts, MD

## 2021-07-17 ENCOUNTER — Ambulatory Visit
Admission: RE | Admit: 2021-07-17 | Discharge: 2021-07-17 | Disposition: A | Discharge status: Home / Self Care | Payer: BC Managed Care – PPO | Source: Ambulatory Visit

## 2021-07-17 DIAGNOSIS — Z1231 Encounter for screening mammogram for malignant neoplasm of breast: Secondary | ICD-10-CM

## 2021-07-18 LAB — PROTEIN ELECTROPHORESIS, SERUM
Albumin ELP: 4 g/dL (ref 3.8–4.8)
Alpha 1: 0.3 g/dL (ref 0.2–0.3)
Alpha 2: 1 g/dL — ABNORMAL HIGH (ref 0.5–0.9)
Beta 2: 0.5 g/dL (ref 0.2–0.5)
Beta Globulin: 0.5 g/dL (ref 0.4–0.6)
Gamma Globulin: 1.4 g/dL (ref 0.8–1.7)
Total Protein: 7.7 g/dL (ref 6.1–8.1)

## 2021-07-22 ENCOUNTER — Other Ambulatory Visit: Payer: Self-pay | Admitting: Family Medicine

## 2021-07-22 ENCOUNTER — Encounter: Payer: Self-pay | Admitting: Family Medicine

## 2021-07-22 DIAGNOSIS — R0602 Shortness of breath: Secondary | ICD-10-CM

## 2021-07-25 ENCOUNTER — Other Ambulatory Visit: Payer: Self-pay

## 2021-07-25 MED ORDER — ALBUTEROL SULFATE HFA 108 (90 BASE) MCG/ACT IN AERS: 2.0000 | INHALATION_SPRAY | RESPIRATORY_TRACT | 1 refills | Status: DC | PRN

## 2021-07-25 MED ORDER — EPIPEN 2-PAK 0.3 MG/0.3ML IJ SOAJ: 0.3000 mg | INTRAMUSCULAR | 0 refills | Status: DC | PRN

## 2021-07-25 NOTE — Progress Notes (Signed)
Cardiology Office Note:    Date:  07/26/2021   ID:  Marilyn [name], DOB 02/25/1966, MRN [id number]  PCP:  Marilyn Ruddy, MD  Cardiologist:  None    Referring MD: Marilyn Ruddy, MD   No chief complaint on file.   History of Present Illness:    Marilyn [name] is a 56 y.o. female with a hx of hypertension, hyperlipidemia, and obesity here today for evaluation of pericardial effusion at the request of Marilyn [name].   She presented to the ED 07/2021 for precordial pain. She was at work when she developed aching L arm pain and swelling. Upon arrival, she also reported R leg pain, R lower quadrant abdominal pain, and generalized chest discomfort. She was diagnosed with COVID over a month prior. She was recommended to be evaluated for possible blood clot due to persistent productive cough after the diagnosis. The pain was managed in the ED and she was encouraged to follow up with her PCP and pertinent specialists upon discharge.   She followed up with Marilyn [name] with concern for deep vein thrombosis. The swelling had decreased significantly. Upper Venous DVT study 07/2021 showed no evidence of DVT or thrombosis.   She followed up with Marilyn [name] and stated the L arm edema was improving. Ever since, she noticed discoloration and mild tingling in her L hand. She reported increased burning in her central chest and tightness in her upper throat. She started taking herbal heartburn medication. She endorsed random intermittent chest pain and R lateral face and hand edema. Chest CTA 07/2021 revealed trace pericardial effusion and aortic arthrosclerosis. She was referred to cardiology.  Today, she is doing fine. She was at work when she started having abdominal pain and L arm swelling. She presented to the ED and was told she had a pericardial effusion. Her PCP referred her to cardiology. She has seen Marilyn [name] in the past who mentioned she was at high risk for MI in the next 5 years.  Her L arm and R lateral face stay swollen constantly. Her L arm also hurts constantly. Occasionally, her legs will swell with the R leg swelling more than her L. She endorses occasional shortness of breath with exertion. Walking from the car to the build can leave her winded. However, the shortness of breath does not occur with every activity. 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 She quit smoking and drinking alcohol as a teenager. Her maternal grandmother had heart disease and died of an MI. Of note, she was infected with COVID 05/2021. She denies any palpitations, lightheadedness, headaches, syncope, orthopnea, or PND.  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   ASTHMA 01/30/2007   Asthma    Breast tumor    right; benign   Chest pain 01/24/2017   CHEST PAIN 01/29/2009   Qualifier: Diagnosis of  By: Marilyn Pate MD, [facility]    Essential hypertension 07/26/2021   GERD 01/31/2007   Hemorrhoids    Hiatal hernia    HTN (hypertension) 09/09/2011   Hyperlipidemia    Hypersomnolence 12/15/2012   HYPOTHYROIDISM 01/30/2007   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   Neck pain 09/09/2011   Obesity 04/11/2014    Past Surgical History:  Procedure Laterality Date   BREAST BIOPSY Right 12/2014   BREAST EXCISIONAL BIOPSY Right 2016   BREAST LUMPECTOMY WITH RADIOACTIVE SEED LOCALIZATION Right 02/25/2015   Procedure: BREAST  LUMPECTOMY WITH RADIOACTIVE SEED LOCALIZATION;  Surgeon: Marilyn Overall, MD;  Location: [facility];  Service: General;  Laterality: Right;   BREAST REDUCTION SURGERY  1986   BREAST SURGERY     broken jaw     CARPAL TUNNEL RELEASE  2001   FRACTURE SURGERY     REDUCTION MAMMAPLASTY Bilateral    THYROIDECTOMY     TUBAL LIGATION  1987    Current Medications: Current Meds  Medication Sig   acetaminophen (TYLENOL) 500 MG tablet Take 500 mg by mouth as needed for moderate pain  (arthritis pain).   albuterol (VENTOLIN HFA) 108 (90 Base) MCG/ACT inhaler Inhale 2 puffs into the lungs every 4 (four) hours as needed for wheezing or shortness of breath (cough, shortness of breath or wheezing.).   amLODipine (NORVASC) 10 MG tablet TAKE ONE TABLET BY MOUTH DAILY   aspirin EC 81 MG tablet Take 1 tablet (81 mg total) by mouth daily. Swallow whole.   atorvastatin (LIPITOR) 20 MG tablet Take 1 tablet (20 mg total) by mouth daily.   Beclomethasone Dipropionate 40 MCG/ACT AERS Place 1 spray into the nose in the morning and at bedtime.   benzonatate (TESSALON PERLES) 100 MG capsule Take 1 capsule (100 mg total) by mouth 3 (three) times daily as needed for cough.   Blood Glucose Monitoring Suppl (ONETOUCH VERIO FLEX SYSTEM) w/Device KIT Use to check blood glucose TID or prn   Cholecalciferol (VITAMIN D) 125 MCG (5000 UT) CAPS Take 1 tablet by mouth daily.   cyclobenzaprine (FLEXERIL) 5 MG tablet Take 1 tablet (5 mg total) by mouth 3 (three) times daily as needed.   EPIPEN 2-PAK 0.3 MG/0.3ML SOAJ injection Inject 0.3 mg into the muscle as needed for anaphylaxis.   glucose blood (ONETOUCH VERIO) test strip USE AS DIRECTED TO CHECK BLOOD SUGAR UP THREE TIMES A DAY   Lancets (ONETOUCH DELICA PLUS [id number]) MISC USE TO TEST ONCE DAILY   levothyroxine (SYNTHROID) 100 MCG tablet TAKE ONE TABLET BY MOUTH DAILY   omeprazole (PRILOSEC) 40 MG capsule Take 1 capsule (40 mg total) by mouth 2 (two) times daily.   ondansetron (ZOFRAN-ODT) 4 MG disintegrating tablet Take 1 tablet (4 mg total) by mouth every 8 (eight) hours as needed for nausea or vomiting.   vitamin B-12 (CYANOCOBALAMIN) 500 MCG tablet Take 500 mcg by mouth daily.     Allergies:   Promethazine-dm, Blain Pais allergy], Codeine, Fluticasone propionate, Promethazine, and Flonase [fluticasone propionate]   Social History   Socioeconomic History   Marital status: Single    Spouse name: Not on file   Number of children: 3   Years of  education: Not on file   Highest education level: Not on file  Occupational History   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[employer]    Employer: [employer]  Tobacco Use   Smoking status: Former    Packs/day: 0.25    Years: 3.00    Pack years: 0.75    Types: Cigarettes    Quit date: 06/04/1980    Years since quitting: 41.1   Smokeless tobacco: Never  Vaping Use   Vaping Use: Never used  Substance and Sexual Activity   Alcohol use: No    Alcohol/week: 0.0 standard drinks   Drug use: No   Sexual activity: Not Currently    Comment: 1st intercourse 12 yo-5 partners  Other Topics Concern   Not on file  Social History Narrative   Married   Social Determinants of Health  Financial Resource Strain: Low Risk    Difficulty of Paying Living Expenses: Not hard at all  Food Insecurity: No Food Insecurity   Worried About Charity fundraiser in the Last Year: Never true   Ran Out of Food in the Last Year: Never true  Transportation Needs: No Transportation Needs   Lack of Transportation (Medical): No   Lack of Transportation (Non-Medical): No  Physical Activity: Insufficiently Active   Days of Exercise per Week: 2 days   Minutes of Exercise per Session: 40 min  Stress: Not on file  Social Connections: Not on file     Family History: The patient's family history includes Asthma in her maternal grandfather and mother; Breast cancer (age of onset: 60) in her sister; Diabetes in her father and maternal grandmother; Heart attack in her maternal grandfather; Heart attack (age of onset: 4) in her maternal grandmother; Heart disease in her maternal grandmother; Hyperlipidemia in her father; Hypertension in her father; Stroke in her maternal grandfather and maternal grandmother; Thyroid disease in her sister. There is no history of Colon cancer.  ROS:   Please see the history of present illness.    (+) Abdominal pain (+) L arm edema/pain (+) R lateral face edema (+) Bilateral LE edema (R leg > L  leg) (+) Dyspnea with exertion All other systems reviewed and negative.   EKGs/Labs/Other Studies Reviewed:    The following studies were reviewed today: Upper Venous Study 07/07/21 Right:  No evidence of thrombosis in the subclavian.     Left:  No evidence of deep vein thrombosis in the upper extremity. No evidence of superficial vein thrombosis in the upper extremity.   CTA Chest 07/05/21 IMPRESSION: 1. No evidence of pulmonary embolus. 2. Trace pericardial effusion. 3. Minimal sigmoid diverticulosis without diverticulitis. 4. Fibroid uterus. 5.  Aortic Atherosclerosis (ICD10-I70.0).  EKG:  EKG was not ordered today  Recent Labs: 07/11/2021: ALT 23; BUN 15; Creatinine, Ser 0.80; Hemoglobin 12.4; Platelets 234.0; Potassium 3.8; Sodium 137; TSH 0.64   Recent Lipid Panel    Component Value Date/Time   CHOL 157 02/20/2021 0751   TRIG 65 02/20/2021 0751   HDL 46 02/20/2021 0751   CHOLHDL 3.4 02/20/2021 0751   CHOLHDL 4 07/17/2019 0732   VLDL 17.8 07/17/2019 0732   LDLCALC 98 02/20/2021 0751   LDLDIRECT 165.3 12/15/2012 1153    Physical Exam:    VS:  BP 124/74 (BP Location: Right Arm, Patient Position: Sitting, Cuff Size: Large)    Pulse 78    Ht 5' 3.5" (1.613 m)    Wt 196 lb 4.8 oz (89 kg)    LMP 03/18/2014    BMI 34.23 kg/m  , BMI Body mass index is 34.23 kg/m. GENERAL:  Well appearing HEENT: Pupils equal round and reactive, fundi not visualized, oral mucosa unremarkable, R-sided lower facial edema NECK:  No jugular venous distention, waveform within normal limits, carotid upstroke brisk and symmetric, no bruits, no thyromegaly LYMPHATICS:  No cervical adenopathy LUNGS:  Clear to auscultation bilaterally HEART:  RRR.  PMI not displaced or sustained,S1 and S2 within normal limits, no S3, no S4, no clicks, no rubs, no murmurs ABD:  Flat, positive bowel sounds normal in frequency in pitch, no bruits, no rebound, no guarding, no midline pulsatile mass, no hepatomegaly, no  splenomegaly EXT:  2 plus pulses throughout, no edema, no cyanosis no clubbing SKIN:  No rashes no nodules NEURO:  Cranial nerves II through XII grossly intact, motor grossly intact throughout  PSYCH:  Cognitively intact, oriented to person place and time  ASSESSMENT:    1. Exertional dyspnea   2. Swollen neck   3. Hyperlipidemia, unspecified hyperlipidemia type   4. Essential hypertension   5. Pericardial effusion    PLAN:    Exertional dyspnea Ms. Meske reports intermittent shortness of breath with exertion.  Sometimes she feels better when she exercises.  However sometimes she notes that she is short of breath even with minimal activity.  I personally reviewed her chest CTA from the hospital.  She had no evidence of any coronary obstruction.  She was negative for pulmonary embolism.  We will get an echocardiogram to evaluate her trivial pericardial effusion, however I really doubt that this is contributing.  No plan for any further ischemic evaluation at this time.  If the above are negative will consider pulmonary referral.  We also discussed the importance of weight loss as obesity and deconditioning may be contributing.   Swollen neck R sided facial swelling.  We will get a CT-V to evaluate the drainage in her neck.   Hyperlipidemia Lipids are reasonable.  Continue atorvastatin 78m daily.   Essential hypertension BP well-controlled on amlodipine.   In order of problems listed above:     Disposition:  FU with APP in 1 month  Medication Adjustments/Labs and Tests Ordered: Current medicines are reviewed at length with the patient today.  Concerns regarding medicines are outlined above.  Orders Placed This Encounter  Procedures   EKG 12-Lead   ECHOCARDIOGRAM COMPLETE   No orders of the defined types were placed in this encounter.  I,Mykaella Javier,acting as a scribe for TSkeet Latch MD.,have documented all relevant documentation on the behalf of TSkeet Latch MD,as directed by  TSkeet Latch MD while in the presence of TSkeet Latch MD.  I, [place]val Linsey MD have reviewed all documentation for this visit.  The documentation of the exam, diagnosis, procedures, and orders on 07/26/2021 are all accurate and complete.   Signed, TSkeet Latch MD  07/26/2021 1:05 PM    [facility]

## 2021-07-26 ENCOUNTER — Encounter (HOSPITAL_BASED_OUTPATIENT_CLINIC_OR_DEPARTMENT_OTHER): Payer: Self-pay | Admitting: Cardiovascular Disease

## 2021-07-26 ENCOUNTER — Ambulatory Visit (HOSPITAL_BASED_OUTPATIENT_CLINIC_OR_DEPARTMENT_OTHER): Payer: BC Managed Care – PPO | Admitting: Cardiovascular Disease

## 2021-07-26 ENCOUNTER — Other Ambulatory Visit: Payer: Self-pay

## 2021-07-26 VITALS — BP 124/74 | HR 78 | Ht 63.5 in | Wt 196.3 lb

## 2021-07-26 DIAGNOSIS — E785 Hyperlipidemia, unspecified: Secondary | ICD-10-CM | POA: Diagnosis not present

## 2021-07-26 DIAGNOSIS — R221 Localized swelling, mass and lump, neck: Secondary | ICD-10-CM

## 2021-07-26 DIAGNOSIS — I10 Essential (primary) hypertension: Secondary | ICD-10-CM

## 2021-07-26 DIAGNOSIS — R0609 Other forms of dyspnea: Secondary | ICD-10-CM | POA: Diagnosis not present

## 2021-07-26 DIAGNOSIS — I3139 Other pericardial effusion (noninflammatory): Secondary | ICD-10-CM

## 2021-07-26 HISTORY — DX: Essential (primary) hypertension: I10

## 2021-07-26 NOTE — Assessment & Plan Note (Signed)
BP well-controlled on amlodipine.

## 2021-07-26 NOTE — Assessment & Plan Note (Signed)
R sided facial swelling.  We will get a CT-V to evaluate the drainage in her neck.

## 2021-07-26 NOTE — Assessment & Plan Note (Signed)
Marilyn [name] reports intermittent shortness of breath with exertion.  Sometimes she feels better when she exercises.  However sometimes she notes that she is short of breath even with minimal activity.  I personally reviewed her chest CTA from the hospital.  She had no evidence of any coronary obstruction.  She was negative for pulmonary embolism.  We will get an echocardiogram to evaluate her trivial pericardial effusion, however I really doubt that this is contributing.  No plan for any further ischemic evaluation at this time.  If the above are negative will consider pulmonary referral.  We also discussed the importance of weight loss as obesity and deconditioning may be contributing.

## 2021-07-26 NOTE — Assessment & Plan Note (Signed)
Lipids are reasonable.  Continue atorvastatin 20mg  daily.

## 2021-07-26 NOTE — Patient Instructions (Signed)
Medication Instructions:  Your physician recommends that you continue on your current medications as directed. Please refer to the Current Medication list given to you today.   *If you need a refill on your cardiac medications before your next appointment, please call your pharmacy*  Lab Work: NONE   Testing/Procedures: Your physician has requested that you have an echocardiogram. Echocardiography is a painless test that uses sound waves to create images of your heart. It provides your doctor with information about the size and shape of your heart and how well your hearts chambers and valves are working. This procedure takes approximately one hour. There are no restrictions for this procedure.  WILL CALL YOU TO SCHEDULE CT   Follow-Up: At [facility], you and your health needs are our priority.  As part of our continuing mission to provide you with exceptional heart care, we have created designated Provider Care Teams.  These Care Teams include your primary Cardiologist (physician) and Advanced Practice Providers (APPs -  Physician Assistants and Nurse Practitioners) who all work together to provide you with the care you need, when you need it.  We recommend signing up for the patient portal called "MyChart".  Sign up information is provided on this After Visit Summary.  MyChart is used to connect with patients for Virtual Visits (Telemedicine).  Patients are able to view lab/test results, encounter notes, upcoming appointments, etc.  Non-urgent messages can be sent to your provider as well.   To learn more about what you can do with MyChart, go to [email or web address].    Your next appointment:   1 month(s)  The format for your next appointment:   In Person  Provider:   Laurann Montana, NP or Coletta Memos, NP

## 2021-08-03 ENCOUNTER — Ambulatory Visit (INDEPENDENT_AMBULATORY_CARE_PROVIDER_SITE_OTHER): Payer: BC Managed Care – PPO

## 2021-08-03 ENCOUNTER — Other Ambulatory Visit: Payer: Self-pay

## 2021-08-03 DIAGNOSIS — I10 Essential (primary) hypertension: Secondary | ICD-10-CM | POA: Diagnosis not present

## 2021-08-03 DIAGNOSIS — I3139 Other pericardial effusion (noninflammatory): Secondary | ICD-10-CM | POA: Diagnosis not present

## 2021-08-03 DIAGNOSIS — R0609 Other forms of dyspnea: Secondary | ICD-10-CM | POA: Diagnosis not present

## 2021-08-03 LAB — ECHOCARDIOGRAM COMPLETE
AR max vel: 2.46 cm2
AV Area VTI: 2.48 cm2
AV Area mean vel: 2.53 cm2
AV Mean grad: 2 mmHg
AV Peak grad: 4.8 mmHg
Ao pk vel: 1.09 m/s
Area-P 1/2: 3.89 cm2
Calc EF: 63.5 %
MV VTI: 3.46 cm2
S' Lateral: 2.39 cm
Single Plane A2C EF: 60.2 %
Single Plane A4C EF: 62 %

## 2021-08-04 ENCOUNTER — Encounter (HOSPITAL_BASED_OUTPATIENT_CLINIC_OR_DEPARTMENT_OTHER): Payer: Self-pay | Admitting: Cardiovascular Disease

## 2021-08-24 ENCOUNTER — Encounter (HOSPITAL_BASED_OUTPATIENT_CLINIC_OR_DEPARTMENT_OTHER): Payer: Self-pay | Admitting: Family

## 2021-08-24 ENCOUNTER — Ambulatory Visit (INDEPENDENT_AMBULATORY_CARE_PROVIDER_SITE_OTHER): Payer: BC Managed Care – PPO | Admitting: Family

## 2021-08-24 ENCOUNTER — Other Ambulatory Visit: Payer: Self-pay

## 2021-08-24 VITALS — BP 132/82 | HR 93 | Ht 63.0 in | Wt 200.1 lb

## 2021-08-24 DIAGNOSIS — E1169 Type 2 diabetes mellitus with other specified complication: Secondary | ICD-10-CM | POA: Diagnosis not present

## 2021-08-24 DIAGNOSIS — R0609 Other forms of dyspnea: Secondary | ICD-10-CM | POA: Diagnosis not present

## 2021-08-24 DIAGNOSIS — I25118 Atherosclerotic heart disease of native coronary artery with other forms of angina pectoris: Secondary | ICD-10-CM | POA: Diagnosis not present

## 2021-08-24 DIAGNOSIS — E785 Hyperlipidemia, unspecified: Secondary | ICD-10-CM

## 2021-08-24 DIAGNOSIS — I10 Essential (primary) hypertension: Secondary | ICD-10-CM | POA: Diagnosis not present

## 2021-08-24 NOTE — Progress Notes (Signed)
? ?Office Visit  ?  ?Patient Name: Marilyn [name] ?Date of Encounter: 08/26/2021 ? ?PCP:  Billie Ruddy, MD ?  ?[place]  ?Cardiologist:  Skeet Latch, MD  ?Advanced Practice Provider:  No care team member to display ?Electrophysiologist:  None    ? ?Chief Complaint  ?  ?Marilyn [name] is a 56 y.o. female with a hx of  nonobstructive CAD, HTN, HLD, obesity  presents today for follow up after echocardiogram  ? ?Past Medical History  ?  ?Past Medical History:  ?Diagnosis Date  ? Allergy   ? 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/2007  ? ASTHMA 01/30/2007  ? Asthma   ? Breast tumor   ? right; benign  ? Chest pain 01/24/2017  ? CHEST PAIN 01/29/2009  ? Qualifier: Diagnosis of  By: Silvio Pate MD, [facility]   ? Essential hypertension 07/26/2021  ? GERD 01/31/2007  ? Hemorrhoids   ? Hiatal hernia   ? HTN (hypertension) 09/09/2011  ? Hyperlipidemia   ? Hypersomnolence 12/15/2012  ? HYPOTHYROIDISM 01/30/2007  ? [place] tear 04/2003  ? Neck pain 09/09/2011  ? Obesity 04/11/2014  ? ?Past Surgical History:  ?Procedure Laterality Date  ? BREAST BIOPSY Right 12/2014  ? BREAST EXCISIONAL BIOPSY Right 2016  ? BREAST LUMPECTOMY WITH RADIOACTIVE SEED LOCALIZATION Right 02/25/2015  ? Procedure: BREAST LUMPECTOMY WITH RADIOACTIVE SEED LOCALIZATION;  Surgeon: Alphonsa Overall, MD;  Location: [facility];  Service: General;  Laterality: Right;  ? [place]  ? BREAST SURGERY    ? broken jaw    ? CARPAL TUNNEL RELEASE  2001  ? FRACTURE SURGERY    ? REDUCTION MAMMAPLASTY Bilateral   ? THYROIDECTOMY    ? TUBAL LIGATION  1987  ? ? ?Allergies ? ?Allergies  ?Allergen Reactions  ? Promethazine-Dm   ? Geralyn Flash [Fish Allergy] Anaphylaxis  ? Codeine Nausea And Vomiting  ? Fluticasone Propionate Other (See Comments)  ? Promethazine Other (See Comments)  ? Flonase [Fluticasone Propionate] Other (See Comments)  ?  Nose bleeds  ? ? ?History of Present Illness  ?  ?Marilyn [name] is a  56 y.o. female with a hx of nonobstructive CAD, HTN, HLD, obesity last seen 07/26/21 by Dr. Oval Linsey. ? ?Previously seen by Dr. Gasper Sells. Cardiac CTA 04/2020 with LAD showing minimal nonobstructive (1-25%) plaque at the take off of the D1 vessel as well as minimal nonobstructive narrowing (1-25%) in the mid LCx.  ? ?ED visit 07/2021 for precordial pain. She was diagnosed with COVID 05/2021. She was Dr. Virl Cagey with concern for arm swelling and concern for DVT - subsequent upper extremity duplex with no evidence of DVT or thrombosis. Chest CTA 07/2021 with trace pericardial effusion and aortic atherosclerosis. ? ?She was seen 07/2021 by Dr. Oval Linsey recommended fro echo due to persistent exertional dyspnea. Echo 08/03/21 with normal LVEF 60-65%, gr1DD, small pericardial effusion, mild MR. ? ?She presents today for follow up. Recently had an episode of sensation of heart pounding after having 2 hot dogs but no other associated symptoms. It resolved after a few seconds without intervention. Her exertional dyspnea is overall unchanged. We reviewed her echocardiogram and she was reassured by the result. She endorses anxiety over not wanting to have a heart attach and we reviewed secondary prevention of coronary disease. She is reducing her sodium intake, monitoring BP at home. Has not yet established formal exercise routine.  ? ?EKGs/Labs/Other Studies Reviewed:  ? ?The following studies were reviewed  today: ?Coronary CTA 04/2020 ?FINDINGS: ?A 100 kV prospective scan was triggered in the descending thoracic ?aorta at 111 HU's. Axial non-contrast 3 mm slices were carried out ?through the heart. The data set was analyzed on a dedicated work ?station and scored using the [place]. Gantry rotation speed ?was 250 msecs and collimation was .6 mm. No beta blockade and 0.8 mg ?of sl NTG was given. The 3D data set was reconstructed in 5% ?intervals of the 67-82 % of the R-R cycle. Diastolic phases were ?analyzed on a  dedicated work station using MPR, MIP and VRT modes. ?The patient received 80 cc of contrast. ?  ?Aorta:  Normal size.  No calcifications.  No dissection. ?  ?Aortic Valve:  Tri-leaflet.  No calcifications. ?  ?Coronary Arteries:  Normal coronary origin.  Right dominance. ?  ?Coronary calcium score of 0. This was 1st percentile for age, sex, ?and race matched control. ?  ?RCA is a large dominant artery that gives rise to PDA and PLA. There ?is no plaque. ?  ?Left main is a large artery that gives rise to LAD and LCX arteries. ?  ?LAD is a large vessel that gives rise to one large D1 branch with a ?minimal non-obstructive (1-24%) soft plaque at the take-off of the ?D1 vessel. ?  ?LCX is a non-dominant artery that gives rise to one large OM1 ?branch. There is a minimal non-obstructive narrowing (1-24%) in the ?mid vessel. ?  ?Other findings: ?  ?Normal pulmonary vein drainage into the left atrium. ?  ?Normal left atrial appendage without a thrombus. ?  ?Normal size of the pulmonary artery. ?  ?Extra-cardiac findings: See attached radiology report for ?non-cardiac structures. ?  ?IMPRESSION: ?1. Coronary calcium score of 0. This was 1st percentile for age, ?sex, and race matched control. ?  ?2. Normal coronary origin with right dominance. ?  ?3. CAD-RADS 1. 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 Consider ?non-atherosclerotic causes of chest pain. Consider preventive ?therapy and risk factor modification. ?Echo 08/2021 ? 1. Left ventricular ejection fraction, by estimation, is 60 to 65%. The  ?left ventricle has normal function. The left ventricle has no regional  ?wall motion abnormalities. There is mild concentric left ventricular  ?hypertrophy. Left ventricular diastolic  ?parameters are consistent with Grade I diastolic dysfunction (impaired  ?relaxation).  ? 2. Right ventricular systolic function is normal. The right ventricular  ?size is normal.  ? 3. Left atrial size was mildly dilated.  ? 4. A small pericardial  effusion is present. The pericardial effusion is  ?circumferential.  ? 5. The mitral valve is normal in structure. Mild mitral valve  ?regurgitation. No evidence of mitral stenosis.  ? 6. The aortic valve is tricuspid. There is mild calcification of the  ?aortic valve. There is mild thickening of the aortic valve. Aortic valve  ?regurgitation is trivial. Aortic valve sclerosis/calcification is present,  ?without any evidence of aortic  ?stenosis.  ? 7. The inferior vena cava is normal in size with greater than 50%  ?respiratory variability, suggesting right atrial pressure of 3 mmHg.  ?EKG:  EKG is not ordered today.   ? ?Recent Labs: ?07/11/2021: ALT 23; BUN 15; Creatinine, Ser 0.80; Hemoglobin 12.4; Platelets 234.0; Potassium 3.8; Sodium 137; TSH 0.64  ?Recent Lipid Panel ?   ?Component Value Date/Time  ? CHOL 157 02/20/2021 0751  ? TRIG 65 02/20/2021 0751  ? HDL 46 02/20/2021 0751  ? CHOLHDL 3.4 02/20/2021 0751  ? CHOLHDL 4 07/17/2019 0732  ? VLDL  17.8 07/17/2019 0732  ? [place] 98 02/20/2021 0751  ? LDLDIRECT 165.3 12/15/2012 1153  ? ? ? ?Home Medications  ? ?Current Meds  ?Medication Sig  ? acetaminophen (TYLENOL) 500 MG tablet Take 500 mg by mouth as needed for moderate pain (arthritis pain).  ? albuterol (VENTOLIN HFA) 108 (90 Base) MCG/ACT inhaler Inhale 2 puffs into the lungs every 4 (four) hours as needed for wheezing or shortness of breath (cough, shortness of breath or wheezing.).  ? amLODipine (NORVASC) 10 MG tablet TAKE ONE TABLET BY MOUTH DAILY  ? aspirin EC 81 MG tablet Take 1 tablet (81 mg total) by mouth daily. Swallow whole.  ? atorvastatin (LIPITOR) 20 MG tablet Take 1 tablet (20 mg total) by mouth daily.  ? Beclomethasone Dipropionate 40 MCG/ACT AERS Place 1 spray into the nose in the morning and at bedtime.  ? benzonatate (TESSALON PERLES) 100 MG capsule Take 1 capsule (100 mg total) by mouth 3 (three) times daily as needed for cough.  ? Blood Glucose Monitoring Suppl ([place])  w/Device KIT Use to check blood glucose TID or prn  ? Cholecalciferol (VITAMIN D) 125 MCG (5000 UT) CAPS Take 1 tablet by mouth daily.  ? cyclobenzaprine (FLEXERIL) 5 MG tablet Take 1 tablet (5 mg total) by mout

## 2021-08-24 NOTE — Patient Instructions (Addendum)
Medication Instructions:  ?Continue your current medications.  ? ?*If you need a refill on your cardiac medications before your next appointment, please call your pharmacy* ? ?Lab Work: ?None ordered today.  ? ?Testing/Procedures: ?Your echocardiogram showed your heart pumping function was normal. This is a good result! Your heart did not relax as well as it used to but this is common with age and very mild. We prevent this from worsening we keep your blood pressure well controlled.  ? ? ?Follow-Up: ?At [facility], you and your health needs are our priority.  As part of our continuing mission to provide you with exceptional heart care, we have created designated Provider Care Teams.  These Care Teams include your primary Cardiologist (physician) and Advanced Practice Providers (APPs -  Physician Assistants and Nurse Practitioners) who all work together to provide you with the care you need, when you need it. ? ?We recommend signing up for the patient portal called "MyChart".  Sign up information is provided on this After Visit Summary.  MyChart is used to connect with patients for Virtual Visits (Telemedicine).  Patients are able to view lab/test results, encounter notes, upcoming appointments, etc.  Non-urgent messages can be sent to your provider as well.   ?To learn more about what you can do with MyChart, go to [email or web address].   ? ?Your next appointment:   ?6 month(s) ? ?The format for your next appointment:   ?In Person ? ?Provider:   ?Skeet Latch, MD  ? ? ?Other Instructions ? ?We have referred you to pulmonology today to discuss your shortness of breath with exertion.  ? ?To prevent palpitations: ?Make sure you are adequately hydrated.  ?Avoid and/or limit caffeine containing beverages like soda or tea. ?Exercise regularly.  ?Manage stress well. ?Some over the counter medications can cause palpitations such as Benadryl, AdvilPM, TylenolPM. Regular Advil or Tylenol do not cause palpitations.    ?If your palpitations start to happen more often please let us know and we can send you a monitor to your home to have you wear.   ?

## 2021-08-27 ENCOUNTER — Other Ambulatory Visit: Payer: Self-pay | Admitting: Family Medicine

## 2021-08-27 DIAGNOSIS — E119 Type 2 diabetes mellitus without complications: Secondary | ICD-10-CM

## 2021-09-01 ENCOUNTER — Telehealth: Payer: Self-pay

## 2021-09-01 NOTE — Telephone Encounter (Signed)
Called to discuss PREP program referral, left voicemail  

## 2021-09-05 ENCOUNTER — Telehealth: Payer: Self-pay

## 2021-09-05 NOTE — Telephone Encounter (Signed)
She returned my call, explained PREP, she wants to attend starting April 17, every M/W 4-5:15p. Assessment visit scheduled for April 10 at 12 noon. ?

## 2021-09-11 NOTE — Progress Notes (Signed)
[facility] PREP Evaluation ? ?Patient Details  ?Name: Marilyn [name] ?MRN: [id number] ?Date of Birth: 1965/08/17 ?Age: 56 y.o. ?PCP: Billie Ruddy, MD ? ?Vitals:  ? 09/11/21 1239  ?BP: 104/64  ?Pulse: 90  ?SpO2: 99%  ?Weight: 195 lb 3.2 oz (88.5 kg)  ? ? ? [facility] Eval - 09/11/21 1200   ? ?  ? [facility] Location  ? [facility]   ?  ? Referral   ? Referring Provider Vella Raring   ? Reason for referral High Cholesterol;Hypertension;Inactivity;Obesitity/Overweight   ? Program Start Date 09/18/21   ?  ? Measurement  ? Waist Circumference 44.5 inches   ? Hip Circumference 45.5 inches   ? Body fat 42 percent   ?  ? Information for Trainer  ? Goals --   Keep A1C/Blood sugar WNL, so does not have to take diabetes meds; lose 10-12 pounds by end of program  ? Current Exercise --   walking  ? Orthopedic Concerns --   knees (OA?)  ? Pertinent Medical History --   HTN, hypothyroid, pre-diabetes, high cholesterol  ?  ? Timed Up and Go (TUGS)  ? Timed Up and Go Low risk <9 seconds   ?  ? Mobility and Daily Activities  ? I find it easy to walk up or down two or more flights of stairs. 3   ? I have no trouble taking out the trash. 4   ? I do housework such as vacuuming and dusting on my own without difficulty. 4   ? I can easily lift a gallon of milk (8lbs). 4   ? I can easily walk a mile. 4   ? I have no trouble reaching into high cupboards or reaching down to pick up something from the floor. 4   ? I do not have trouble doing out-door work such as Armed forces logistics/support/administrative officer, raking leaves, or gardening. 4   ?  ? Mobility and Daily Activities  ? I feel younger than my age. 1   ? I feel independent. 2   ? I feel energetic. 2   ? I live an active life.  2   ? I feel strong. 2   ? I feel healthy. 2   ? I feel active as other people my age. 2   ?  ? How fit and strong are you.  ? Fit and Strong Total Score 40   ? ?  ?  ? ?  ? ?Past Medical History:  ?Diagnosis Date  ? Allergy   ? 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/2007  ?  ASTHMA 01/30/2007  ? Asthma   ? Breast tumor   ? right; benign  ? Chest pain 01/24/2017  ? CHEST PAIN 01/29/2009  ? Qualifier: Diagnosis of  By: Silvio Pate MD, [facility]   ? Essential hypertension 07/26/2021  ? GERD 01/31/2007  ? Hemorrhoids   ? Hiatal hernia   ? HTN (hypertension) 09/09/2011  ? Hyperlipidemia   ? Hypersomnolence 12/15/2012  ? HYPOTHYROIDISM 01/30/2007  ? [place] tear 04/2003  ? Neck pain 09/09/2011  ? Obesity 04/11/2014  ? ?Past Surgical History:  ?Procedure Laterality Date  ? BREAST BIOPSY Right 12/2014  ? BREAST EXCISIONAL BIOPSY Right 2016  ? BREAST LUMPECTOMY WITH RADIOACTIVE SEED LOCALIZATION Right 02/25/2015  ? Procedure: BREAST LUMPECTOMY WITH RADIOACTIVE SEED LOCALIZATION;  Surgeon: Alphonsa Overall, MD;  Location: [place];  Service: General;  Laterality: Right;  ? BREAST REDUCTION SURGERY  1986  ? BREAST SURGERY    ? broken jaw    ? CARPAL TUNNEL RELEASE  2001  ? FRACTURE SURGERY    ? REDUCTION MAMMAPLASTY Bilateral   ? THYROIDECTOMY    ? TUBAL LIGATION  1987  ? ?Social History  ? ?Tobacco Use  ?Smoking Status Former  ? Packs/day: 0.25  ? Years: 3.00  ? Pack years: 0.75  ? Types: Cigarettes  ? Quit date: 06/04/1980  ? Years since quitting: 41.2  ?Smokeless Tobacco Never  ?To begin PREP classes at [facility], starting April 17, every M/W from 4-5:15pm ? ?[place] ?09/11/2021, 12:43 PM ? ? ?

## 2021-09-26 NOTE — Progress Notes (Signed)
[facility] PREP Weekly Session ? ?Patient Details  ?Name: Marilyn [name] ?MRN: [id number] ?Date of Birth: Jul 20, 1965 ?Age: 56 y.o. ?PCP: Billie Ruddy, MD ? ?There were no vitals filed for this visit. ? ? [facility] Weekly seesion - 09/26/21 0900   ? ?  ? [facility] Location  ? [facility]   ?  ? Weekly Session  ? Topic Discussed Goal setting and welcome to the program   Introductions, review of PREP workbook, tour of facility, option for intro to cardio machines  ? ?  ?  ? ?  ? ? ?[place] ?09/26/2021, 9:11 AM ? ? ?

## 2021-10-03 NOTE — Progress Notes (Signed)
[facility] PREP Weekly Session ? ?Patient Details  ?Name: Sarabella Caprio ?MRN: [id number] ?Date of Birth: 1965-12-25 ?Age: 56 y.o. ?PCP: Billie Ruddy, MD ? ?Vitals:  ? 10/03/21 0920  ?Weight: 198 lb (89.8 kg)  ? ? ? [facility] Weekly seesion - 10/03/21 0900   ? ?  ? [facility] Location  ? [facility]   ?  ? Weekly Session  ? Topic Discussed Importance of resistance training;Other ways to be active   Goal to sit no longer than 30 minutes; work up to 150 min cardio/wk and strength training 1-2 times/wk for 20-40 minutes  ? Minutes exercised this week 60 minutes   ? Classes attended to date 3   ? ?  ?  ? ?  ? ? ?[place] ?10/03/2021, 9:20 AM ? ? ?

## 2021-10-10 NOTE — Progress Notes (Signed)
[facility] PREP Weekly Session ? ?Patient Details  ?Name: Marilyn [name] ?MRN: [id number] ?Date of Birth: 09-17-1965 ?Age: 56 y.o. ?PCP: Billie Ruddy, MD ? ?Vitals:  ? 10/10/21 0844  ?Weight: 200 lb 8 oz (90.9 kg)  ? ? ? [facility] Weekly seesion - 10/10/21 0800   ? ?  ? [facility]PREP" Location  ? [facility]   ?  ? Weekly Session  ? Topic Discussed Healthy eating tips   Healthy oils, added sugars, enriched/processed grains, sodium; water intake work up to 1/2 body weigth in ounces  ? Minutes exercised this week 240 minutes   ? Classes attended to date 5   ? ?  ?  ? ?  ? ? ?[place]achery [place] ?10/10/2021, 8:45 AM ? ? ?

## 2021-10-17 NOTE — Progress Notes (Signed)
[facility] PREP Weekly Session ? ?Patient Details  ?Name: Marilyn [name] ?MRN: [id number] ?Date of Birth: 12/03/1965 ?Age: 56 y.o. ?PCP: Billie Ruddy, MD ? ?Vitals:  ? 10/17/21 0923  ?Weight: 196 lb 6.4 oz (89.1 kg)  ? ? ? [facility] Weekly seesion - 10/17/21 0900   ? ?  ? [facility] Location  ? [facility]   ?  ? Weekly Session  ? Topic Discussed Healthy eating tips;Health habits   Sugar demo; YUKA app  ? Minutes exercised this week 410 minutes   ? Classes attended to date 7   ? ?  ?  ? ?  ? ? ?[place] ?10/17/2021, 9:26 AM ? ? ?

## 2021-10-24 ENCOUNTER — Other Ambulatory Visit: Payer: Self-pay

## 2021-10-24 ENCOUNTER — Other Ambulatory Visit: Payer: Self-pay | Admitting: Family Medicine

## 2021-10-24 DIAGNOSIS — E119 Type 2 diabetes mellitus without complications: Secondary | ICD-10-CM

## 2021-10-24 MED ORDER — ONETOUCH VERIO VI STRP: ORAL_STRIP | 2 refills | Status: DC

## 2021-10-24 NOTE — Progress Notes (Signed)
[facility] PREP Weekly Session  Patient Details  Name: Marilyn [name] MRN: [id number] Date of Birth: 1965-07-14 Age: 56 y.o. PCP: Billie Ruddy, MD  Vitals:   10/24/21 0900  Weight: 199 lb 3.2 oz (90.4 kg)     [facility] Weekly seesion - 10/24/21 0900       [facility] "PREP" Location   [facility] "PREP" Location [facility]      Weekly Session   Topic Discussed Restaurant Eating   Salt demo; 1500-'2300mg'$  sodium intake for the day; myfitnesspal app and review of YUKA app   Minutes exercised this week 295 minutes    Classes attended to date [place] 10/24/2021, 9:01 AM

## 2021-11-06 NOTE — Progress Notes (Signed)
[facility] PREP Weekly Session  Patient Details  Name: Marilyn [name] MRN: [id number] Date of Birth: 12/20/1965 Age: 56 y.o. PCP: Billie Ruddy, MD  Vitals:   11/06/21 1656  Weight: 198 lb (89.8 kg)     [facility] Weekly seesion - 11/06/21 1600       [facility] "PREP" Location   [facility] "PREP" Location [facility]      Weekly Session   Topic Discussed Calorie breakdown   Carbs, fats, proteins; nutrient dense foods   Minutes exercised this week 335 minutes    Classes attended to date [place] 11/06/2021, 4:58 PM

## 2021-11-14 ENCOUNTER — Encounter: Payer: Self-pay | Admitting: *Deleted

## 2021-11-15 ENCOUNTER — Encounter (HOSPITAL_BASED_OUTPATIENT_CLINIC_OR_DEPARTMENT_OTHER): Payer: Self-pay | Admitting: *Deleted

## 2021-11-15 NOTE — Progress Notes (Signed)
Exercised today as part of PREP program, resistance band training. At the end of the class, she reported she was having HR>100 at rest and at times feels fluttering. On exam, HR irreg, tachycardia around 115-120; reached out to Alvina Filbert at Dr. Blenda Mounts office, but no physician currently in the office; advised her to either go to ER this evening especially if she develops chest pain, shortness of breath. Otherwise she will contact Dr. Oval Linsey in the am to advise about symptoms for further follow up.

## 2021-11-17 ENCOUNTER — Ambulatory Visit (INDEPENDENT_AMBULATORY_CARE_PROVIDER_SITE_OTHER): Payer: BC Managed Care – PPO | Admitting: Family

## 2021-11-17 ENCOUNTER — Ambulatory Visit (INDEPENDENT_AMBULATORY_CARE_PROVIDER_SITE_OTHER): Payer: BC Managed Care – PPO

## 2021-11-17 ENCOUNTER — Encounter (HOSPITAL_BASED_OUTPATIENT_CLINIC_OR_DEPARTMENT_OTHER): Payer: Self-pay | Admitting: Family

## 2021-11-17 VITALS — BP 114/70 | HR 108 | Ht 63.0 in | Wt 198.0 lb

## 2021-11-17 DIAGNOSIS — E785 Hyperlipidemia, unspecified: Secondary | ICD-10-CM

## 2021-11-17 DIAGNOSIS — E039 Hypothyroidism, unspecified: Secondary | ICD-10-CM

## 2021-11-17 DIAGNOSIS — I25118 Atherosclerotic heart disease of native coronary artery with other forms of angina pectoris: Secondary | ICD-10-CM

## 2021-11-17 DIAGNOSIS — G4730 Sleep apnea, unspecified: Secondary | ICD-10-CM

## 2021-11-17 DIAGNOSIS — I493 Ventricular premature depolarization: Secondary | ICD-10-CM

## 2021-11-17 DIAGNOSIS — R002 Palpitations: Secondary | ICD-10-CM

## 2021-11-17 DIAGNOSIS — R0683 Snoring: Secondary | ICD-10-CM

## 2021-11-17 MED ORDER — METOPROLOL SUCCINATE ER 25 MG PO TB24: 25.0000 mg | ORAL_TABLET | Freq: Every day | ORAL | 5 refills | Status: DC

## 2021-11-17 NOTE — Patient Instructions (Addendum)
Medication Instructions:  Your physician has recommended you make the following change in your medication:   START Metoprolol Succinate one '25mg'$  tablet daily  *If you need a refill on your cardiac medications before your next appointment, please call your pharmacy*   Lab Work: Your physician recommends that you return for lab work today: magnesium, CBC, thyroid panel, BMP  If you have labs (blood work) drawn today and your tests are completely normal, you will receive your results only by: [place] (if you have MyChart) OR A paper copy in the mail If you have any lab test that is abnormal or we need to change your treatment, we will call you to review the results.   Testing/Procedures: Your EKG today showed sinus tachycardia with an occasional early beat in the bottom chamber of your heart.   WatchPAT?  Is a FDA cleared portable home sleep study test that uses a watch and 3 points of contact to monitor 7 different channels, including your heart rate, oxygen saturations, body position, snoring, and chest motion.  The study is easy to use from the comfort of your own home and accurately detect sleep apnea.  Before bed, you attach the chest sensor, attached the sleep apnea bracelet to your nondominant hand, and attach the finger probe.  After the study, the raw data is downloaded from the watch and scored for apnea events.   For more information: [email or web address]  Patient Testing Instructions:  Do not put battery into the device until bedtime when you are ready to begin the test. Please call the support number if you need assistance after following the instructions below: 24 hour support line- [phone] or [facility] support at [phone] (option 2)  Download the [employer] AmericanWatchPAT One" app through the google play store or App Store  Be sure to turn on or enable access to bluetooth in [place] on your smartphone/ device  Make sure no other bluetooth devices  are on and within the vicinity of your smartphone/ device and WatchPAT watch during testing.  Make sure to leave your smart phone/ device plugged in and charging all night.  When ready for bed:  Follow the instructions step by step in the WatchPAT One App to activate the testing device. For additional instructions, including video instruction, visit the WatchPAT One video on Youtube. You can search for [place] One within Youtube (video is 4 minutes and 18 seconds) or enter: https://youtube/watch?v=BCce_vbiwxE Please note: You will be prompted to enter a Pin to connect via bluetooth when starting the test. The PIN will be assigned to you when you receive the test.  The device is disposable, but it recommended that you retain the device until you receive a call letting you know the study has been received and the results have been interpreted.  We will let you know if the study did not transmit to [place] properly after the test is completed. You do not need to call us to confirm the receipt of the test.  Please complete the test within 48 hours of receiving PIN.   Frequently Asked Questions:  What is Watch [place] one?  A single use fully disposable home sleep apnea testing device and will not need to be returned after completion.  What are the requirements to use WatchPAT one?  The be able to have a successful watchpat one sleep study, you should have your Watch pat one device, your smart phone, watch pat one app, your PIN number and Internet access What type of phone do  I need?  You should have a smart phone that uses Android 5.1 and above or any Iphone with IOS 10 and above How can I download the WatchPAT one app?  Based on your device type search for WatchPAT one app either in google play for android devices or APP store for Iphone's Where will I get my PIN for the study?  Your PIN will be provided by your physician's office. It is used for authentication and if you lose/forget your PIN, please reach  out to your providers office.  I do not have Internet at home. Can I do WatchPAT one study?  WatchPAT One needs Internet connection throughout the night to be able to transmit the sleep data. You can use your home/local internet or your cellular's data package. However, it is always recommended to use home/local Internet. It is estimated that between 20MB-30MB will be used with each study.However, the application will be looking for 80MB space in the phone to start the study.  What happens if I lose internet or bluetooth connection?  During the internet disconnection, your phone will not be able to transmit the sleep data. All the data, will be stored in your phone. As soon as the internet connection is back on, the phone will being sending the sleep data. During the bluetooth disconnection, WatchPAT one will not be able to to send the sleep data to your phone. Data will be kept in the [facility] one until two devices have bluetooth connection back on. As soon as the connection is back on, WatchPAT one will send the sleep data to the phone.  How long do I need to wear the WatchPAT one?  After you start the study, you should wear the device at least 6 hours.  How far should I keep my phone from the device?  During the night, your phone should be within 15 feet.  What happens if I leave the room for restroom or other reasons?  Leaving the room for any reason will not cause any problem. As soon as your get back to the room, both devices will reconnect and will continue to send the sleep data. Can I use my phone during the sleep study?  Yes, you can use your phone as usual during the study. But it is recommended to put your watchpat one on when you are ready to go to bed.  How will I get my study results?  A soon as you completed your study, your sleep data will be sent to the provider. They will then share the results with you when they are ready.    Follow-Up: At [facility], you and your health  needs are our priority.  As part of our continuing mission to provide you with exceptional heart care, we have created designated Provider Care Teams.  These Care Teams include your primary Cardiologist (physician) and Advanced Practice Providers (APPs -  Physician Assistants and Nurse Practitioners) who all work together to provide you with the care you need, when you need it.  We recommend signing up for the patient portal called "MyChart".  Sign up information is provided on this After Visit Summary.  MyChart is used to connect with patients for Virtual Visits (Telemedicine).  Patients are able to view lab/test results, encounter notes, upcoming appointments, etc.  Non-urgent messages can be sent to your provider as well.   To learn more about what you can do with MyChart, go to [email or web address].    Your next appointment:  As scheduled with Dr. Oval Linsey in September   Other Instructions  To prevent palpitations: Make sure you are adequately hydrated.  Avoid and/or limit caffeine containing beverages like soda or tea. Exercise regularly.  Manage stress well. Some over the counter medications can cause palpitations such as Benadryl, AdvilPM, TylenolPM. Regular Advil or Tylenol do not cause palpitations.

## 2021-11-17 NOTE — Progress Notes (Signed)
Office Visit    Patient Name: Marilyn [name] Date of Encounter: 11/17/2021  PCP:  Billie Ruddy, MD   [place]  Cardiologist:  Skeet Latch, MD  Advanced Practice Provider:  No care team member to display Electrophysiologist:  None     Chief Complaint    Marilyn [name] is a 56 y.o. female with a hx of  nonobstructive CAD, HTN, HLD, obesity  presents today for tachycardia.  Past Medical History    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   Neck pain 09/09/2011   Obesity 04/11/2014   Past Surgical History:  Procedure Laterality Date   BREAST BIOPSY Right 12/2014   BREAST EXCISIONAL BIOPSY Right 2016   BREAST LUMPECTOMY WITH RADIOACTIVE SEED LOCALIZATION Right 02/25/2015   Procedure: BREAST LUMPECTOMY WITH RADIOACTIVE SEED LOCALIZATION;  Surgeon: Alphonsa Overall, MD;  Location: [facility];  Service: General;  Laterality: Right;   BREAST REDUCTION SURGERY  1986   BREAST SURGERY     broken jaw     CARPAL TUNNEL RELEASE  2001   FRACTURE SURGERY     REDUCTION MAMMAPLASTY Bilateral    THYROIDECTOMY     TUBAL LIGATION  1987    Allergies  Allergies  Allergen Reactions   Promethazine-Dm    Geralyn Flash [Fish Allergy] Anaphylaxis   Codeine Nausea And Vomiting   Fluticasone Propionate Other (See Comments)   Promethazine Other (See Comments)   Flonase [Fluticasone Propionate] Other (See Comments)    Nose bleeds    History of Present Illness    Marilyn [name] is a 56 y.o. female with  a hx of nonobstructive CAD, HTN, HLD, obesity last seen 08/24/21  Previously seen by Dr. Gasper Sells. Cardiac CTA 04/2020 with LAD showing minimal nonobstructive (1-25%) plaque at the take off of the D1 vessel as well as minimal nonobstructive narrowing (1-25%) in the mid LCx.   ED visit 07/2021 for precordial pain. She was diagnosed with COVID 05/2021. She was Dr. Virl Cagey with concern for arm swelling and concern for DVT - subsequent upper extremity duplex with no evidence of DVT or thrombosis. Chest CTA 07/2021 with trace pericardial effusion and aortic atherosclerosis.  She was seen 07/2021 by Dr. Oval Linsey recommended fro echo due to persistent exertional dyspnea. Echo 08/03/21 with normal LVEF 60-65%, gr1DD, small pericardial effusion, mild MR. Follow up 08/24/21 reviewed echo and she was reassured. She noted occasional palpitations which were overall not bothersome. Avoidance of caffeine, adequate hydration, and stress reduction were recommended.   Presents today for tachycardia x 1 week feeling as if her heart is beating quickly not associated with pain nor dyspnea. Drinks two 64 oz bottles of water. 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 Does not routinely monitor BP/HR at home.   EKGs/Labs/Other Studies Reviewed:   The following studies were reviewed today: Coronary CTA 04/2020 FINDINGS: A 100 kV prospective scan was triggered in the descending thoracic aorta at 111  HU's. Axial non-contrast 3 mm slices were carried out through the heart. The data set was analyzed on a dedicated work station and scored using the [place]. Gantry rotation speed was 250 msecs and collimation was .6 mm. No beta blockade and 0.8 mg of sl NTG was given. The 3D data set was reconstructed in 5% intervals of the 67-82 % of the R-R cycle. Diastolic phases were analyzed on a dedicated work station using MPR, MIP and VRT modes. The patient received 80 cc of contrast.   Aorta:  Normal size.  No  calcifications.  No dissection.   Aortic Valve:  Tri-leaflet.  No calcifications.   Coronary Arteries:  Normal coronary origin.  Right dominance.   Coronary calcium score of 0. This was 1st percentile for age, sex, and race matched control.   RCA is a large dominant artery that gives rise to PDA and PLA. There is no plaque.   Left main is a large artery that gives rise to LAD and LCX arteries.   LAD is a large vessel that gives rise to one large D1 branch with a minimal non-obstructive (1-24%) soft plaque at the take-off of the D1 vessel.   LCX is a non-dominant artery that gives rise to one large OM1 branch. There is a minimal non-obstructive narrowing (1-24%) in the mid vessel.   Other findings:   Normal pulmonary vein drainage into the left atrium.   Normal left atrial appendage without a thrombus.   Normal size of the pulmonary artery.   Extra-cardiac findings: See attached radiology report for non-cardiac structures.   IMPRESSION: 1. Coronary calcium score of 0. This was 1st percentile for age, sex, and race matched control.   2. Normal coronary origin with right dominance.   3. CAD-RADS 1. 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 Consider non-atherosclerotic causes of chest pain. Consider preventive therapy and risk factor modification. Echo 08/2021  1. Left ventricular ejection fraction, by estimation, is 60 to 65%. The  left ventricle has normal function. The left ventricle has no regional  wall motion abnormalities. There is mild concentric left ventricular  hypertrophy. Left ventricular diastolic  parameters are consistent with Grade I diastolic dysfunction (impaired  relaxation).   2. Right ventricular systolic function is normal. The right ventricular  size is normal.   3. Left atrial size was mildly dilated.   4. A small pericardial effusion is present. The pericardial effusion is  circumferential.   5. The mitral valve is normal in structure. Mild mitral  valve  regurgitation. No evidence of mitral stenosis.   6. The aortic valve is tricuspid. There is mild calcification of the  aortic valve. There is mild thickening of the aortic valve. Aortic valve  regurgitation is trivial. Aortic valve sclerosis/calcification is present,  without any evidence of aortic  stenosis.   7. The inferior vena cava is normal in size with greater than 50%  respiratory variability, suggesting right atrial pressure of 3 mmHg.   EKG:  EKG is ordered today.  EKG performed today  reveals ST 108 bpm with PV's. No acute ST/T wave changes.   Recent Labs: 07/11/2021: ALT 23; BUN 15; Creatinine, Ser 0.80; Hemoglobin 12.4; Platelets 234.0; Potassium 3.8; Sodium 137; TSH 0.64  Recent Lipid Panel    Component Value Date/Time   CHOL 157 02/20/2021 0751   TRIG 65 02/20/2021 0751   HDL 46 02/20/2021 0751   CHOLHDL 3.4 02/20/2021 0751   CHOLHDL 4 07/17/2019 0732   VLDL 17.8 07/17/2019 0732  [place] 98 02/20/2021 0751   LDLDIRECT 165.3 12/15/2012 1153   Home Medications   Current Meds  Medication Sig   acetaminophen (TYLENOL) 500 MG tablet Take 500 mg by mouth as needed for moderate pain (arthritis pain).   albuterol (VENTOLIN HFA) 108 (90 Base) MCG/ACT inhaler Inhale 2 puffs into the lungs every 4 (four) hours as needed for wheezing or shortness of breath (cough, shortness of breath or wheezing.).   amLODipine (NORVASC) 10 MG tablet TAKE ONE TABLET BY MOUTH DAILY   aspirin EC 81 MG tablet Take 1 tablet (81 mg total) by mouth daily. Swallow whole.   atorvastatin (LIPITOR) 20 MG tablet Take 1 tablet (20 mg total) by mouth daily.   Beclomethasone Dipropionate 40 MCG/ACT AERS Place 1 spray into the nose in the morning and at bedtime.   benzonatate (TESSALON PERLES) 100 MG capsule Take 1 capsule (100 mg total) by mouth 3 (three) times daily as needed for cough.   Blood Glucose Monitoring Suppl (ONETOUCH VERIO FLEX SYSTEM) w/Device KIT Use to check blood glucose TID or prn    Cholecalciferol (VITAMIN D) 125 MCG (5000 UT) CAPS Take 1 tablet by mouth daily.   cyclobenzaprine (FLEXERIL) 5 MG tablet Take 1 tablet (5 mg total) by mouth 3 (three) times daily as needed.   EPIPEN 2-PAK 0.3 MG/0.3ML SOAJ injection Inject 0.3 mg into the muscle as needed for anaphylaxis.   glucose blood (ONETOUCH VERIO) test strip USE ONE STRIP TO TEST THREE TIMES A DAY   Lancets (ONETOUCH DELICA PLUS [id number]) MISC USE 1 LANCET TO TEST BLOOD SUGAR ONCE DAILY   levothyroxine (SYNTHROID) 100 MCG tablet TAKE ONE TABLET BY MOUTH DAILY   ondansetron (ZOFRAN-ODT) 4 MG disintegrating tablet Take 1 tablet (4 mg total) by mouth every 8 (eight) hours as needed for nausea or vomiting.   vitamin B-12 (CYANOCOBALAMIN) 500 MCG tablet Take 500 mcg by mouth daily.     Review of Systems      All other systems reviewed and are otherwise negative except as noted above.  Physical Exam    VS:  BP 114/70   Pulse (!) 108   Ht '5\' 3"'  (1.6 m)   Wt 198 lb (89.8 kg)   LMP 02/21/2013   BMI 35.07 kg/m  , BMI Body mass index is 35.07 kg/m.  Wt Readings from Last 3 Encounters:  11/17/21 198 lb (89.8 kg)  11/14/21 198 lb 6.4 oz (90 kg)  11/06/21 198 lb (89.8 kg)    GEN: Well nourished, overweight, well developed, in no acute distress. HEENT: normal. Neck: Supple, no JVD, carotid bruits, or masses. Cardiac: RRR, no murmurs, rubs, or gallops. No clubbing, cyanosis, edema.  Radials/PT 2+ and equal bilaterally.  Respiratory:  Respirations regular and unlabored, clear to auscultation bilaterally. GI: Soft, nontender, nondistended. MS: No deformity or atrophy. Skin: Warm and dry, no rash. Neuro:  Strength and sensation are intact. Psych: Normal affect.  Assessment & Plan    Exertional dyspnea / Nonobstructive coronary disease - Mild nonobstructive disease by cardiac CTA 04/2020. Secondary prevention through aspirin, atorvastatin. Heart healthy diet and regular cardiovascular exercise encouraged. Recent echo  with normal LVEF, gr1DD, no significant valvular abnormalities.   HLD - LDL goal <70. Continue Atorvastatin 38m QD. Denies myalgias.   Palpitations / PVC - EKG today ST 108 bpm with PVC. Staying hydrated, no etoh, no excessive caffeine. Echo 10/2021 with no significant valvular abnormalities. 14 day ZIO placed today. Start Metoprolol Succinate 261mdaily.   HTN -BP  well controlled. Continue current antihypertensive regimen Amlodipine 35m daily.   Hypothyroidism - Continue to follow with PCP. Update thyroid panel.   Obesity - Weight loss via diet and exercise encouraged. Discussed the impact being overweight would have on cardiovascular risk. Participating in PCitigroupprogram at [employer]  Disposition: Follow up in 6 month(s) with TSkeet Latch MD or APP.  Signed, CLoel Dubonnet NP 08/24/2021, 4:20 PM [facility]

## 2021-11-18 LAB — BASIC METABOLIC PANEL
BUN/Creatinine Ratio: 14 (ref 9–23)
BUN: 14 mg/dL (ref 6–24)
CO2: 22 mmol/L (ref 20–29)
Calcium: 8.7 mg/dL (ref 8.7–10.2)
Chloride: 102 mmol/L (ref 96–106)
Creatinine, Ser: 0.99 mg/dL (ref 0.57–1.00)
Glucose: 98 mg/dL (ref 70–99)
Potassium: 4 mmol/L (ref 3.5–5.2)
Sodium: 140 mmol/L (ref 134–144)
eGFR: 67 mL/min/{1.73_m2} (ref >59)

## 2021-11-18 LAB — CBC
Hematocrit: 37.5 % (ref 34.0–46.6)
Hemoglobin: 12.3 g/dL (ref 11.1–15.9)
MCH: 27.3 pg (ref 26.6–33.0)
MCHC: 32.8 g/dL (ref 31.5–35.7)
MCV: 83 fL (ref 79–97)
Platelets: 231 10*3/uL (ref 150–450)
RBC: 4.5 x10E6/uL (ref 3.77–5.28)
RDW: 13.3 % (ref 11.7–15.4)
WBC: 4.7 10*3/uL (ref 3.4–10.8)

## 2021-11-18 LAB — MAGNESIUM: Magnesium: 2.4 mg/dL — ABNORMAL HIGH (ref 1.6–2.3)

## 2021-11-18 LAB — THYROID PANEL WITH TSH
Free Thyroxine Index: 2.6 (ref 1.2–4.9)
T3 Uptake Ratio: 25 % (ref 24–39)
T4, Total: 10.4 ug/dL (ref 4.5–12.0)
TSH: 0.352 u[IU]/mL — ABNORMAL LOW (ref 0.450–4.500)

## 2021-11-19 ENCOUNTER — Encounter (HOSPITAL_BASED_OUTPATIENT_CLINIC_OR_DEPARTMENT_OTHER): Payer: Self-pay | Admitting: Family

## 2021-11-20 ENCOUNTER — Telehealth: Payer: Self-pay | Admitting: *Deleted

## 2021-11-20 NOTE — Progress Notes (Signed)
[facility] PREP Weekly Session  Patient Details  Name: Marilyn [name] MRN: [id number] Date of Birth: 1966/06/03 Age: 56 y.o. PCP: Billie Ruddy, MD  Vitals:   11/20/21 1703  Weight: 197 lb 6.4 oz (89.5 kg)     [facility] Weekly seesion - 11/20/21 1700       [facility] "PREP" Location   [facility] "PREP" Location [facility]      Weekly Session   Topic Discussed Expectations and non-scale victories   Halfway through program,time to check in for  review/revisit/restate goals   Classes attended to date [place] 11/20/2021, 5:04 PM

## 2021-11-20 NOTE — Addendum Note (Signed)
Addended by: Loel Dubonnet on: 11/20/2021 07:49 AM   Modules accepted: Orders

## 2021-11-20 NOTE — Telephone Encounter (Signed)
Staff message sent to Marilyn [name] ok to activate itamar. Per [employer] no PA is required for sleep studies.

## 2021-12-01 ENCOUNTER — Encounter (HOSPITAL_BASED_OUTPATIENT_CLINIC_OR_DEPARTMENT_OTHER): Payer: Self-pay

## 2021-12-01 MED ORDER — COMFORT TOUCH BP CUFF/LARGE MISC: 0 refills | Status: DC

## 2021-12-01 NOTE — Telephone Encounter (Signed)
Can we try sending a prescription for a BP cuff for this patient?

## 2021-12-01 NOTE — Telephone Encounter (Signed)
Recommend she call her insurance company to see if they will cover BP cuff.  We can go ahead and send a prescription to her local pharmacy but commercial insurance may or may not approve.  She should be able to purchase an upper arm automatic cuff online or at pharmacy for about $35.  At the pharmacy at our location it is $34.04 after tax.   Loel Dubonnet, NP

## 2021-12-06 MED ORDER — COMFORT TOUCH BP CUFF/LARGE MISC: 0 refills | Status: DC

## 2021-12-06 NOTE — Telephone Encounter (Signed)
Patient's regular pharmacy does not have prescription BP cuffs faxing to [employer] [facility] at [phone]

## 2021-12-06 NOTE — Addendum Note (Signed)
Addended by: Gerald Stabs on: 12/06/2021 09:00 AM   Modules accepted: Orders

## 2021-12-11 NOTE — Progress Notes (Signed)
[facility] PREP Weekly Session  Patient Details  Name: Marilyn [name] MRN: [id number] Date of Birth: 1966/02/16 Age: 55 y.o. PCP: Billie Ruddy, MD  Vitals:   12/11/21 1701  Weight: 198 lb 3.2 oz (89.9 kg)     [facility] Weekly seesion - 12/11/21 1700       [facility] "PREP" Location   [facility] "PREP" Location [facility]      Weekly Session   Topic Discussed Finding support   Label review   Minutes exercised this week 900 minutes    Classes attended to date [place] 12/11/2021, 5:02 PM

## 2021-12-14 ENCOUNTER — Encounter (INDEPENDENT_AMBULATORY_CARE_PROVIDER_SITE_OTHER): Payer: BC Managed Care – PPO | Admitting: Cardiology

## 2021-12-14 ENCOUNTER — Telehealth: Payer: Self-pay | Admitting: Cardiovascular Disease

## 2021-12-14 DIAGNOSIS — I10 Essential (primary) hypertension: Secondary | ICD-10-CM

## 2021-12-14 DIAGNOSIS — G4733 Obstructive sleep apnea (adult) (pediatric): Secondary | ICD-10-CM

## 2021-12-14 DIAGNOSIS — G4734 Idiopathic sleep related nonobstructive alveolar hypoventilation: Secondary | ICD-10-CM

## 2021-12-14 MED ORDER — COMFORT TOUCH BP CUFF/LARGE MISC: 0 refills | Status: DC

## 2021-12-14 NOTE — Telephone Encounter (Signed)
  Pt said, she was told by Dr. Oval Linsey to call her insurance to ask if they will cover a blood pressure cuff for her. She was told she need a code so they can look it up. She is asking for the code for blood pressure cuff. Also, she received her equipment for her sleep study but she forgot the code as well to start it up

## 2021-12-14 NOTE — Telephone Encounter (Signed)
Code 3353 given to patient  Rx for monitor sent to [facility], gave code listed on Rx had but advised no procedure code

## 2021-12-15 ENCOUNTER — Ambulatory Visit: Payer: BC Managed Care – PPO

## 2021-12-15 DIAGNOSIS — R0683 Snoring: Secondary | ICD-10-CM

## 2021-12-15 DIAGNOSIS — G4730 Sleep apnea, unspecified: Secondary | ICD-10-CM

## 2021-12-15 NOTE — Procedures (Signed)
   SLEEP STUDY REPORT Patient Information Study Date: 12/15/21 Patient Name: Marilyn [name] Patient ID: [id number] Birth Date: Nov 17, 2065 Age: 56 Gender: Female BMI: 35.2 (W=198 lb, H=5' 3'') Referring Physician: Laurann Montana, NP  TEST DESCRIPTION: Home sleep apnea testing was completed using the WatchPat, a Type 1 device, utilizing  peripheral arterial tonometry (PAT), chest movement, actigraphy, pulse oximetry, pulse rate, body position and snore.  AHI was calculated with apnea and hypopnea using valid sleep time as the denominator. RDI includes apneas,  hypopneas, and RERAs. The data acquired and the scoring of sleep and all associated events were performed in  accordance with the recommended standards and specifications as outlined in the AASM Manual for the Scoring of  Sleep and Associated Events 2.2.0 (2015).  FINDINGS: 1. Moderate Obstructive Sleep Apnea with AHI 28.5/hr overall and 36.3/hr during REM sleep.  2. No Central Sleep Apnea with pAHIc 1.2/hr. 3. Oxygen desaturations as low as 75%. 4. Mild to moderate snoring was present. O2 sats were < 88% for 30.8 min. 5. Total sleep time was 6 hrs and 31 min. 6. 25.2% of total sleep time was spent in REM sleep.  7. Normal sleep onset latency at 22 min 8. Shortened REM sleep onset latency at 25 min.  9. Total awakenings were 13.   DIAGNOSIS:  Moderate Obstructive Sleep Apnea (G47.33) Nocturnal Hypoxemia  RECOMMENDATIONS: 1. Clinical correlation of these findings is necessary. The decision to treat obstructive sleep apnea (OSA) is usually  based on the presence of apnea symptoms or the presence of associated medical conditions such as Hypertension,  Congestive Heart Failure, Atrial Fibrillation or Obesity. The most common symptoms of OSA are snoring, gasping for  breath while sleeping, daytime sleepiness and fatigue.   2. Initiating apnea therapy is recommended given the presence of symptoms and/or associated conditions.   Recommend proceeding with one of the following:   a. Auto-CPAP therapy with a pressure range of 5-20cm H2O.   b. An oral appliance (OA) that can be obtained from certain dentists with expertise in sleep medicine. These are  primarily of use in non-obese patients with mild and moderate disease.   c. An ENT consultation which may be useful to look for specific causes of obstruction and possible treatment  options.   d. If patient is intolerant to PAP therapy, consider referral to ENT for evaluation for hypoglossal nerve stimulator.   3. Close follow-up is necessary to ensure success with CPAP or oral appliance therapy for maximum benefit .  4. A follow-up oximetry study on CPAP is recommended to assess the adequacy of therapy and determine the need  for supplemental oxygen or the potential need for Bi-level therapy. An arterial blood gas to determine the adequacy of  baseline ventilation and oxygenation should also be considered.  5. Healthy sleep recommendations include: adequate nightly sleep (normal 7-9 hrs/night), avoidance of caffeine after  noon and alcohol near bedtime, and maintaining a sleep environment that is cool, dark and quiet.  6. Weight loss for overweight patients is recommended. Even modest amounts of weight loss can significantly  improve the severity of sleep apnea.  7. Snoring recommendations include: weight loss where appropriate, side sleeping, and avoidance of alcohol before  bed.  8. Operation of motor vehicle should not be performed when sleepy.  Signature: Electronically Signed: 12/15/21 Fransico Him, MD; [facility]; [place], American Board of  Sleep Medicine

## 2021-12-18 MED ORDER — COMFORT TOUCH BP CUFF/LARGE MISC: 0 refills | Status: AC

## 2021-12-18 NOTE — Addendum Note (Signed)
Addended by: Alvina Filbert B on: 12/18/2021 04:15 PM   Modules accepted: Orders

## 2021-12-18 NOTE — Telephone Encounter (Signed)
Called [place] and [place] discount to ask about coverage Was told that patient may have to pay up front and be reimbursed but needed to reach out to insurance company to clarify Advised patient and she will let us know what we need to do

## 2021-12-18 NOTE — Progress Notes (Signed)
[facility] PREP Weekly Session  Patient Details  Name: Marilyn [name] MRN: [id number] Date of Birth: 1966-01-07 Age: 56 y.o. PCP: Billie Ruddy, MD  Vitals:   12/18/21 1651  Weight: 199 lb 6.4 oz (90.4 kg)     [facility] Weekly seesion - 12/18/21 1600       [facility] "PREP" Location   [facility] "PREP" Location [facility]      Weekly Session   Topic Discussed Hitting roadblocks   Fit testing completed, final assessment visit scheduled for Wednesday; asked to complete goals and activity for plan for next 12 weeks and complete PREP class survey   Minutes exercised this week 290 minutes    Classes attended to date [place] 12/18/2021, 4:57 PM

## 2021-12-20 NOTE — Progress Notes (Signed)
[facility] PREP Evaluation  Patient Details  Name: Marilyn [name] MRN: [id number] Date of Birth: 1966/01/31 Age: 56 y.o. PCP: Billie Ruddy, MD  Vitals:   12/20/21 0908  BP: 106/64  Pulse: 90  SpO2: 95%  Weight: 197 lb 9.6 oz (89.6 kg)     [facility] Eval - 12/20/21 0900       [facility] "PREP" Location   [facility] "PREP" Location [place]      Referral    Program Start Date 12/20/21   Program end date     Measurement   Waist Circumference 45.5 inches    Hip Circumference 44.5 inches    Body fat 40.9 percent      Mobility and Daily Activities   I find it easy to walk up or down two or more flights of stairs. 4    I have no trouble taking out the trash. 4    I do housework such as vacuuming and dusting on my own without difficulty. 4    I can easily lift a gallon of milk (8lbs). 4    I can easily walk a mile. 4    I have no trouble reaching into high cupboards or reaching down to pick up something from the floor. 4    I do not have trouble doing out-door work such as Armed forces logistics/support/administrative officer, raking leaves, or gardening. 4      Mobility and Daily Activities   I feel younger than my age. 4    I feel independent. 4    I feel energetic. 4    I live an active life.  3    I feel strong. 4    I feel healthy. 4    I feel active as other people my age. 4      How fit and strong are you.   Fit and Strong Total Score 55            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   Neck pain 09/09/2011   Obesity 04/11/2014   Past Surgical History:   Procedure Laterality Date   BREAST BIOPSY Right 12/2014   BREAST EXCISIONAL BIOPSY Right 2016   BREAST LUMPECTOMY WITH RADIOACTIVE SEED LOCALIZATION Right 02/25/2015   Procedure: BREAST LUMPECTOMY WITH RADIOACTIVE SEED LOCALIZATION;  Surgeon: Alphonsa Overall, MD;  Location: [facility];  Service: General;  Laterality: Right;   BREAST REDUCTION SURGERY  1986   BREAST SURGERY     broken jaw     CARPAL TUNNEL RELEASE  2001   FRACTURE SURGERY     REDUCTION MAMMAPLASTY Bilateral    THYROIDECTOMY     TUBAL LIGATION  1987   Social History   Tobacco Use  Smoking Status Former   Packs/day: 0.25   Years: 3.00   Total pack years: 0.75   Types: Cigarettes   Quit date: 06/04/1980   Years since quitting: 41.5  Smokeless Tobacco Never  Education sessions completed: 12  Workout sessions completed: 12 How fit and strong survey: 4/10: 40   7/19: 55  Cyndal Kasson B [place] 12/20/2021, 9:10 AM

## 2022-01-05 ENCOUNTER — Other Ambulatory Visit: Payer: Self-pay | Admitting: Internal Medicine

## 2022-01-05 NOTE — Telephone Encounter (Signed)
Rx request sent to pharmacy.  

## 2022-01-05 NOTE — Telephone Encounter (Signed)
Pt sees Dr. Oval Linsey now. Thanks

## 2022-01-12 ENCOUNTER — Ambulatory Visit: Payer: BC Managed Care – PPO | Admitting: Family Medicine

## 2022-01-12 ENCOUNTER — Encounter: Payer: Self-pay | Admitting: Family Medicine

## 2022-01-12 VITALS — BP 118/80 | HR 83 | Resp 12 | Ht 63.0 in | Wt 198.2 lb

## 2022-01-12 DIAGNOSIS — E890 Postprocedural hypothyroidism: Secondary | ICD-10-CM | POA: Diagnosis not present

## 2022-01-12 DIAGNOSIS — M25542 Pain in joints of left hand: Secondary | ICD-10-CM

## 2022-01-12 DIAGNOSIS — M25541 Pain in joints of right hand: Secondary | ICD-10-CM

## 2022-01-12 DIAGNOSIS — M79631 Pain in right forearm: Secondary | ICD-10-CM

## 2022-01-12 DIAGNOSIS — K219 Gastro-esophageal reflux disease without esophagitis: Secondary | ICD-10-CM

## 2022-01-12 DIAGNOSIS — M79632 Pain in left forearm: Secondary | ICD-10-CM | POA: Diagnosis not present

## 2022-01-12 LAB — URIC ACID: Uric Acid, Serum: 3.9 mg/dL (ref 2.4–7.0)

## 2022-01-12 LAB — TSH: TSH: 0.8 u[IU]/mL (ref 0.35–5.50)

## 2022-01-12 LAB — C-REACTIVE PROTEIN: CRP: <1 mg/dL (ref 0.5–20.0)

## 2022-01-12 LAB — T4, FREE: Free T4: 0.99 ng/dL (ref 0.60–1.60)

## 2022-01-12 MED ORDER — MELOXICAM 15 MG PO TABS: 15.0000 mg | ORAL_TABLET | Freq: Every day | ORAL | 0 refills | Status: DC

## 2022-01-12 NOTE — Patient Instructions (Signed)
A few things to remember from today's visit:  Postoperative hypothyroidism - Plan: T4, free, TSH  Arthralgia of both hands - Plan: Uric acid, C-reactive protein  Gastroesophageal reflux disease, unspecified whether esophagitis present  Pain in both forearms - Plan: C-reactive protein  If you need refills please call your pharmacy. Do not use My Chart to request refills or for acute issues that need immediate attention.   Upper extremities pain seems musculoskeletal. Hand osteoarthritis could be causing pain. Meloxicam 15 mg daily for 7-10 days then as needed.  Try to work on GERD precautions. Avoid going to bed 3-4 hours after eating. Continue Omeprazole.  Please be sure medication list is accurate. If a new problem present, please set up appointment sooner than planned today.

## 2022-01-12 NOTE — Progress Notes (Unsigned)
ACUTE VISIT Chief Complaint  Patient presents with   Joint Pain    In her hand, started on the left side but now it's more on the right. X a week.    HPI: Marilyn [name] is a 56 y.o. female, who is here today complaining of about a week of proximal forearm pain and IP hand pain as described above. She has had left forearm pain intermittently since 03/2021. States that problem started after episodes of whole LUE edema and pain, she was evaluated I the ED and reporting cardiac work up as negative. "Squeezing" tightness like sensation, intermittently.  No associated neck pain. Bilateral hand numbness noted when she first wakes up in the morning, alleviated by movement and massage. Hx of right carpal tuner synd, s/p surgical treatment a few years ago.  Lab Results  Component Value Date   VITAMINB12 1,266 (H) 07/11/2021   She feels some "bumps" when palpating forearm muscle and has noted that left arm is bigger than right, intermittent episodes of pain. She has right elbow injury a few weeks ago. She has not noted elbow edema or erythema. IP joint pain, bilateral. Intermittent edema of MCP joints, this time index and middle finger. No erythema or limitation of ROM. She thinks her fingers are "blue."  She is also concerned about recent TSH.  According to patient, her cardiologist recommended follow-up with PCP and to have medication adjusted. Negative for palpitations, tremor, or abnormal weight loss. Currently she is on levothyroxine at 100 mcg daily.  Lab Results  Component Value Date   TSH 0.352 (L) 11/17/2021   She wonders if joint pain is being caused by medication were recently added by her cardiologist, metoprolol succinate 25 mg daily, started about 3 weeks ago.  Retrosternal burning like pain this morning after drinking water.  Negative for exertional CP or dyspnea.  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 Yesterday she had moderate to severe heartburn.   Nausea 2 days ago after eating pizza. Frequently she goes to bed after earing. He is currently on omeprazole 40 mg twice daily. Negative for abdominal pain, changes in bowel habits, vomiting, melena, or blood in the stool.  Review of Systems  Constitutional:  Negative for activity change, appetite change and fever.  HENT:  Negative for mouth sores, nosebleeds and trouble swallowing.   Respiratory:  Negative for cough and wheezing.   Cardiovascular:  Negative for palpitations and leg swelling.  Endocrine: Negative for cold intolerance and heat intolerance.  Genitourinary:  Negative for decreased urine volume and hematuria.  Skin:  Negative for rash.  Neurological:  Negative for syncope, facial asymmetry and weakness.  Psychiatric/Behavioral:  Negative for confusion. The patient is nervous/anxious.   Rest see pertinent positives and negatives per HPI.  Current Outpatient Medications on File Prior to Visit  Medication Sig Dispense Refill   acetaminophen (TYLENOL) 500 MG tablet Take 500 mg by mouth as needed for moderate pain (arthritis pain).     albuterol (VENTOLIN HFA) 108 (90 Base) MCG/ACT inhaler Inhale 2 puffs into the lungs every 4 (four) hours as needed for wheezing or shortness of breath (cough, shortness of breath or wheezing.). 6.7 g 1   amLODipine (NORVASC) 10 MG tablet TAKE ONE TABLET BY MOUTH DAILY 90 tablet 1   aspirin EC 81 MG tablet Take 1 tablet (81 mg total) by mouth daily. Swallow whole. 90 tablet 3   atorvastatin (LIPITOR) 20 MG tablet TAKE ONE  TABLET BY MOUTH DAILY 90 tablet 1   Beclomethasone Dipropionate 40 MCG/ACT AERS Place 1 spray into the nose in the morning and at bedtime. 6.8 g 1   Blood Glucose Monitoring Suppl (Holt) w/Device KIT Use to check blood glucose TID or prn 1 kit 0   Blood Pressure Monitoring (COMFORT TOUCH BP CUFF/LARGE) MISC Please measure blood pressure 1 time per day 1-2  hours after taking medication and resting for 5-10 minutes, please keep a log and bring to all future appointments. DX I10 1 each 0   Cholecalciferol (VITAMIN D) 125 MCG (5000 UT) CAPS Take 1 tablet by mouth daily.     cyclobenzaprine (FLEXERIL) 5 MG tablet Take 1 tablet (5 mg total) by mouth 3 (three) times daily as needed. 90 tablet 1   EPIPEN 2-PAK 0.3 MG/0.3ML SOAJ injection Inject 0.3 mg into the muscle as needed for anaphylaxis. 1 each 0   glucose blood (ONETOUCH VERIO) test strip USE ONE STRIP TO TEST THREE TIMES A DAY 100 strip 2   Lancets (ONETOUCH DELICA PLUS [id number]) MISC USE 1 LANCET TO TEST BLOOD SUGAR ONCE DAILY 100 each 2   levothyroxine (SYNTHROID) 100 MCG tablet TAKE ONE TABLET BY MOUTH DAILY 90 tablet 1   metoprolol succinate (TOPROL XL) 25 MG 24 hr tablet Take 1 tablet (25 mg total) by mouth daily. 30 tablet 5   ondansetron (ZOFRAN-ODT) 4 MG disintegrating tablet Take 1 tablet (4 mg total) by mouth every 8 (eight) hours as needed for nausea or vomiting. 8 tablet 0   vitamin B-12 (CYANOCOBALAMIN) 500 MCG tablet Take 500 mcg by mouth daily.     omeprazole (PRILOSEC) 40 MG capsule Take 1 capsule (40 mg total) by mouth 2 (two) times daily. 30 capsule 11   No current facility-administered medications on file prior to visit.   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   Neck pain 09/09/2011   Obesity 04/11/2014   Allergies  Allergen Reactions   Promethazine-Dm    Geralyn Flash [Fish Allergy] Anaphylaxis   Codeine Nausea And Vomiting   Fluticasone Propionate  Other (See Comments)   Promethazine Other (See Comments)   Flonase [Fluticasone Propionate] Other (See Comments)    Nose bleeds   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[employer]    Employer: [employer]  Tobacco Use   Smoking status: Former    Packs/day: 0.25    Years: 3.00    Total pack years: 0.75    Types: Cigarettes    Quit date: 06/04/1980    Years since quitting: 41.6   Smokeless tobacco: Never  Vaping Use   Vaping Use: Never used  Substance and Sexual Activity   Alcohol use: No  Alcohol/week: 0.0 standard drinks of alcohol   Drug use: No   Sexual activity: Not Currently    Comment: 1st intercourse 69 yo-5 partners  Other Topics Concern   Not on file  Social History Narrative   Married   Social Determinants of Health   Financial Resource Strain: Low Risk  (07/26/2021)   Overall Financial Resource Strain (CARDIA)    Difficulty of Paying Living Expenses: Not hard at all  Food Insecurity: No Food Insecurity (07/26/2021)   Hunger Vital Sign    Worried About Running Out of Food in the Last Year: Never true    Ran Out of Food in the Last Year: Never true  Transportation Needs: No Transportation Needs (07/26/2021)   PRAPARE - Hydrologist (Medical): No    Lack of Transportation (Non-Medical): No  Physical Activity: Insufficiently Active (07/26/2021)   Exercise Vital Sign    Days of Exercise per Week: 2 days    Minutes of Exercise per Session: 40 min  Stress: Not on file  Social Connections: Not on file   Vitals:   01/12/22 1146  BP: 118/80  Pulse: 83  Resp: 12  SpO2: 92%   Body mass index is 35.12 kg/m.  Physical Exam Vitals and nursing note reviewed.  Constitutional:      General: She is not in acute distress.    Appearance:  She is well-developed.  HENT:     Head: Normocephalic and atraumatic.     Mouth/Throat:     Mouth: Mucous membranes are moist.     Pharynx: Oropharynx is clear.  Eyes:     Conjunctiva/sclera: Conjunctivae normal.  Cardiovascular:     Rate and Rhythm: Normal rate and regular rhythm.     Heart sounds: No murmur heard. Pulmonary:     Effort: Pulmonary effort is normal. No respiratory distress.     Breath sounds: Normal breath sounds.  Abdominal:     Palpations: Abdomen is soft. There is no hepatomegaly or mass.     Tenderness: There is no abdominal tenderness.  Musculoskeletal:     Right elbow: No swelling. Normal range of motion. Tenderness (Pain with pronation against resistance, not upon palpation.) present in lateral epicondyle. No olecranon process tenderness.     Left elbow: No swelling or deformity. Normal range of motion. No tenderness.     Right hand: Tenderness present. Normal range of motion. Normal sensation. Normal capillary refill. Normal pulse.     Left hand: No bony tenderness. Normal range of motion. Normal capillary refill. Normal pulse.       Arms:     Comments: Some IP with Heberden's node and Bouchard's nodes. Right hand with mild edema or MCP joints 2-3rd, no erythema or local heat. Tenderness with light palpation, mainly palmar area.  Tinel and Phalen negative. Proximal forearm tenderness with internal rotation.  Lymphadenopathy:     Cervical: No cervical adenopathy.  Skin:    General: Skin is warm.     Findings: No erythema or rash.  Neurological:     General: No focal deficit present.     Mental Status: She is alert and oriented to person, place, and time.     Cranial Nerves: No cranial nerve deficit.     Gait: Gait normal.  Psychiatric:        Mood and Affect: Mood is anxious.     Comments: Well groomed, good eye contact.   ASSESSMENT AND PLAN:  Ms.Marilyn [name] was seen today for joint  pain.  Diagnoses and all orders for this visit: Orders Placed This  Encounter  Procedures   T4, free   TSH   Uric acid   C-reactive protein   Lab Results  Component Value Date   TSH 0.80 01/12/2022   Lab Results  Component Value Date   LABURIC 3.9 01/12/2022   Lab Results  Component Value Date   CRP <1.0 01/12/2022   Arthralgia of both hands ?  OA.  We discussed differential diagnosis. Today we will obtain CRP and uric acid. Mobic 15 mg daily for 7 to 10 days will help.  Son side effect discussed.  -     meloxicam (MOBIC) 15 MG tablet; Take 1 tablet (15 mg total) by mouth daily.  Pain in both forearms We discussed possible etiologies. History and examination today do not suggest a serious process.  It seems musculoskeletal,?  Tendinitis. Radicular pain also to be consider, may need cervical imaging if persistent. I do not think imaging is needed at this time. Reassured about "bumpy" areas of concerned in proximal forearms, adipose tissue most likely.Continue monitoring for changes.  I think she would benefit from a short course of NSAIDs, she denies any contraindication, she has taken naproxen in the past. Recommend meloxicam 15 mg daily for 7 to 10 days and then as needed.  -     meloxicam (MOBIC) 15 MG tablet; Take 1 tablet (15 mg total) by mouth daily.  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 For now recommend continue levothyroxine 100 mcg daily, if TSH is still abnormal medication will be adjusted I will plan on repeating labs in 6 to 8 weeks. Continue following with PCP.  Gastroesophageal reflux disease, unspecified whether esophagitis present Problem is not well controlled. Stressed the importance of following GERD precautions. We discussed other pharmacologic options, she prefers to continue omeprazole, she just got a refill, so continue 40 mg twice daily. Follow with PCP in 5 weeks.  I spent a total of 42 minutes in both face to face and non face to face activities for this visit on the date of this  encounter. During this time history was obtained and documented, examination was performed, prior labs/imaging reviewed, and assessment/plan discussed.  Return in about 5 weeks (around 02/16/2022) for GERD and pain with Dr Volanda Napoleon.  Chrisa Hassan G. Martin[place], MD  [facility]. [place] office.

## 2022-01-17 ENCOUNTER — Other Ambulatory Visit: Payer: Self-pay

## 2022-01-17 DIAGNOSIS — I10 Essential (primary) hypertension: Secondary | ICD-10-CM

## 2022-01-17 DIAGNOSIS — E890 Postprocedural hypothyroidism: Secondary | ICD-10-CM

## 2022-01-17 MED ORDER — LEVOTHYROXINE SODIUM 100 MCG PO TABS: 100.0000 ug | ORAL_TABLET | Freq: Every day | ORAL | 1 refills | Status: DC

## 2022-01-17 MED ORDER — AMLODIPINE BESYLATE 10 MG PO TABS: 10.0000 mg | ORAL_TABLET | Freq: Every day | ORAL | 1 refills | Status: DC

## 2022-01-22 ENCOUNTER — Telehealth: Payer: Self-pay | Admitting: *Deleted

## 2022-01-22 NOTE — Telephone Encounter (Signed)
-----   Message from Cleon Gustin, RN sent at 01/13/2022  4:13 PM EDT ----- Jaynie Crumble ordered by Laurann Montana ----- Message ----- From: Lauralee Evener, CMA Sent: 01/02/2022   8:57 AM EDT To: Freada Bergeron, CMA   ----- Message ----- From: Sueanne Margarita, MD Sent: 12/15/2021   2:00 PM EDT To: Cv Div Sleep Studies  Please let patient know that they have sleep apnea.  Recommend therapeutic CPAP titration for treatment of patient's sleep disordered breathing.  If unable to perform an in lab titration then initiate ResMed auto CPAP from 4 to 15cm H2O with heated humidity and mask of choice and overnight pulse ox on CPAP.

## 2022-01-22 NOTE — Telephone Encounter (Signed)
Left message to return a call to discuss sleep study results. 

## 2022-01-23 ENCOUNTER — Other Ambulatory Visit: Payer: Self-pay | Admitting: Cardiology

## 2022-01-23 DIAGNOSIS — G4736 Sleep related hypoventilation in conditions classified elsewhere: Secondary | ICD-10-CM

## 2022-01-23 DIAGNOSIS — G4733 Obstructive sleep apnea (adult) (pediatric): Secondary | ICD-10-CM

## 2022-01-23 NOTE — Telephone Encounter (Signed)
Patient returned a call and was given sleep study results and recommendations. She agrees to proceed with CPAP titration.

## 2022-01-31 ENCOUNTER — Emergency Department (HOSPITAL_BASED_OUTPATIENT_CLINIC_OR_DEPARTMENT_OTHER)
Admission: EM | Admit: 2022-01-31 | Discharge: 2022-01-31 | Disposition: A | Discharge status: Home / Self Care | Payer: BC Managed Care – PPO | Attending: Emergency Medicine | Admitting: Emergency Medicine

## 2022-01-31 ENCOUNTER — Encounter (HOSPITAL_BASED_OUTPATIENT_CLINIC_OR_DEPARTMENT_OTHER): Payer: Self-pay

## 2022-01-31 DIAGNOSIS — J029 Acute pharyngitis, unspecified: Secondary | ICD-10-CM | POA: Diagnosis present

## 2022-01-31 DIAGNOSIS — E039 Hypothyroidism, unspecified: Secondary | ICD-10-CM | POA: Insufficient documentation

## 2022-01-31 DIAGNOSIS — R079 Chest pain, unspecified: Secondary | ICD-10-CM | POA: Insufficient documentation

## 2022-01-31 DIAGNOSIS — Z7982 Long term (current) use of aspirin: Secondary | ICD-10-CM | POA: Diagnosis not present

## 2022-01-31 DIAGNOSIS — R059 Cough, unspecified: Secondary | ICD-10-CM

## 2022-01-31 DIAGNOSIS — Z20822 Contact with and (suspected) exposure to covid-19: Secondary | ICD-10-CM | POA: Insufficient documentation

## 2022-01-31 DIAGNOSIS — J45909 Unspecified asthma, uncomplicated: Secondary | ICD-10-CM | POA: Insufficient documentation

## 2022-01-31 DIAGNOSIS — E119 Type 2 diabetes mellitus without complications: Secondary | ICD-10-CM | POA: Diagnosis not present

## 2022-01-31 LAB — CBC WITH DIFFERENTIAL/PLATELET
Abs Immature Granulocytes: 0.01 10*3/uL (ref 0.00–0.07)
Basophils Absolute: 0 10*3/uL (ref 0.0–0.1)
Basophils Relative: 0 %
Eosinophils Absolute: 0.1 10*3/uL (ref 0.0–0.5)
Eosinophils Relative: 3 %
HCT: 34.9 % — ABNORMAL LOW (ref 36.0–46.0)
Hemoglobin: 11.5 g/dL — ABNORMAL LOW (ref 12.0–15.0)
Immature Granulocytes: 0 %
Lymphocytes Relative: 45 %
Lymphs Abs: 2 10*3/uL (ref 0.7–4.0)
MCH: 26.9 pg (ref 26.0–34.0)
MCHC: 33 g/dL (ref 30.0–36.0)
MCV: 81.7 fL (ref 80.0–100.0)
Monocytes Absolute: 0.4 10*3/uL (ref 0.1–1.0)
Monocytes Relative: 9 %
Neutro Abs: 1.9 10*3/uL (ref 1.7–7.7)
Neutrophils Relative %: 43 %
Platelets: 231 10*3/uL (ref 150–400)
RBC: 4.27 MIL/uL (ref 3.87–5.11)
RDW: 13.2 % (ref 11.5–15.5)
WBC: 4.5 10*3/uL (ref 4.0–10.5)
nRBC: 0 % (ref 0.0–0.2)

## 2022-01-31 LAB — TROPONIN I (HIGH SENSITIVITY): Troponin I (High Sensitivity): <2 ng/L (ref <18)

## 2022-01-31 LAB — BRAIN NATRIURETIC PEPTIDE: B Natriuretic Peptide: 4.7 pg/mL (ref 0.0–100.0)

## 2022-01-31 LAB — SARS CORONAVIRUS 2 BY RT PCR: SARS Coronavirus 2 by RT PCR: NEGATIVE

## 2022-01-31 LAB — GROUP A STREP BY PCR: Group A Strep by PCR: NOT DETECTED

## 2022-01-31 MED ORDER — ACETAMINOPHEN 500 MG PO TABS
1000.0000 mg | ORAL_TABLET | Freq: Once | ORAL | Status: AC
  Administered 2022-01-31: 1000 mg via ORAL
  Filled 2022-01-31: qty 2

## 2022-01-31 NOTE — ED Provider Notes (Signed)
[facility] EMERGENCY DEPT Provider Note   CSN: [id number] Arrival date & time: 01/31/22  0546     History  Chief Complaint  Patient presents with   Sore Throat   Chest Pain    Marilyn [name] is a 56 y.o. female.  Patient is a 56 year old female with past medical history of hypothyroidism, hyperlipidemia, anemia, asthma, diabetes.  Patient presenting today with complaints of sore throat and chest discomfort.  This started yesterday.  She describes congestion, cough, sore throat, and seeing "calcium deposits" in the back of her throat.  She denies to me she is having any fevers or chills.  She does describe some palpitations when she ambulates.  She denies any ill contacts.  The history is provided by the patient.       Home Medications Prior to Admission medications   Medication Sig Start Date End Date Taking? Authorizing Provider  acetaminophen (TYLENOL) 500 MG tablet Take 500 mg by mouth as needed for moderate pain (arthritis pain).    [provider]  albuterol (VENTOLIN HFA) 108 (90 Base) MCG/ACT inhaler Inhale 2 puffs into the lungs every 4 (four) hours as needed for wheezing or shortness of breath (cough, shortness of breath or wheezing.). 07/25/21   Billie Ruddy, MD  amLODipine (NORVASC) 10 MG tablet Take 1 tablet (10 mg total) by mouth daily. 01/17/22   Billie Ruddy, MD  aspirin EC 81 MG tablet Take 1 tablet (81 mg total) by mouth daily. Swallow whole. 03/30/20   Werner Lean, MD  atorvastatin (LIPITOR) 20 MG tablet TAKE ONE TABLET BY MOUTH DAILY 01/05/22   Skeet Latch, MD  Beclomethasone Dipropionate 40 MCG/ACT AERS Place 1 spray into the nose in the morning and at bedtime. 03/23/21   Billie Ruddy, MD  Blood Glucose Monitoring Suppl (ONETOUCH VERIO FLEX SYSTEM) w/Device KIT Use to check blood glucose TID or prn 07/10/21   Billie Ruddy, MD  Blood Pressure Monitoring (COMFORT TOUCH BP CUFF/LARGE) MISC Please measure  blood pressure 1 time per day 1-2 hours after taking medication and resting for 5-10 minutes, please keep a log and bring to all future appointments. DX I10 12/18/21   Skeet Latch, MD  Cholecalciferol (VITAMIN D) 125 MCG (5000 UT) CAPS Take 1 tablet by mouth daily.    [provider]  cyclobenzaprine (FLEXERIL) 5 MG tablet Take 1 tablet (5 mg total) by mouth 3 (three) times daily as needed. 12/27/17   Jaynee Eagles, PA-C  EPIPEN 2-PAK 0.3 MG/0.3ML SOAJ injection Inject 0.3 mg into the muscle as needed for anaphylaxis. 07/25/21   Mosie Lukes, MD  glucose blood (ONETOUCH VERIO) test strip USE ONE STRIP TO TEST THREE TIMES A DAY 10/24/21   Billie Ruddy, MD  Lancets (ONETOUCH DELICA PLUS [id number]) MISC USE 1 LANCET TO TEST BLOOD SUGAR ONCE DAILY 10/24/21   Billie Ruddy, MD  levothyroxine (SYNTHROID) 100 MCG tablet Take 1 tablet (100 mcg total) by mouth daily. 01/17/22   Billie Ruddy, MD  meloxicam (MOBIC) 15 MG tablet Take 1 tablet (15 mg total) by mouth daily. 01/12/22 02/11/22  Martinique, Betty G, MD  metoprolol succinate (TOPROL XL) 25 MG 24 hr tablet Take 1 tablet (25 mg total) by mouth daily. 11/17/21   Loel Dubonnet, NP  omeprazole (PRILOSEC) 40 MG capsule Take 1 capsule (40 mg total) by mouth 2 (two) times daily. 07/12/21 08/11/21  Ladene Artist, MD  ondansetron (ZOFRAN-ODT) 4 MG disintegrating tablet Take  1 tablet (4 mg total) by mouth every 8 (eight) hours as needed for nausea or vomiting. 07/30/19   Davonna Belling, MD  vitamin B-12 (CYANOCOBALAMIN) 500 MCG tablet Take 500 mcg by mouth daily.    [provider]      Allergies    Promethazine-dm, Blain Pais allergy], Codeine, Fluticasone propionate, Promethazine, and Flonase [fluticasone propionate]    Review of Systems   Review of Systems  All other systems reviewed and are negative.   Physical Exam Updated Vital Signs BP 119/84   Pulse 86   Temp 98.6 F (37 C)   Resp 18   LMP 02/21/2013   SpO2 97%   Physical Exam Vitals and nursing note reviewed.  Constitutional:      General: She is not in acute distress.    Appearance: She is well-developed. She is not diaphoretic.  HENT:     Head: Normocephalic and atraumatic.     Mouth/Throat:     Mouth: Mucous membranes are moist. No oral lesions.     Pharynx: No oropharyngeal exudate or posterior oropharyngeal erythema.     Tonsils: No tonsillar exudate or tonsillar abscesses.     Comments: There is perhaps 1 small area of debris within a cryptic tonsil to the left of the posterior oropharynx. Cardiovascular:     Rate and Rhythm: Normal rate and regular rhythm.     Heart sounds: No murmur heard.    No friction rub. No gallop.  Pulmonary:     Effort: Pulmonary effort is normal. No respiratory distress.     Breath sounds: Normal breath sounds. No wheezing.  Abdominal:     General: Bowel sounds are normal. There is no distension.     Palpations: Abdomen is soft.     Tenderness: There is no abdominal tenderness.  Musculoskeletal:        General: Normal range of motion.     Cervical back: Normal range of motion and neck supple.  Skin:    General: Skin is warm and dry.  Neurological:     General: No focal deficit present.     Mental Status: She is alert and oriented to person, place, and time.     ED Results / Procedures / Treatments   Labs (all labs ordered are listed, but only abnormal results are displayed) Labs Reviewed  SARS CORONAVIRUS 2 BY RT PCR  BRAIN NATRIURETIC PEPTIDE  CBC WITH DIFFERENTIAL/PLATELET  TROPONIN I (HIGH SENSITIVITY)    EKG None  Radiology No results found.  Procedures Procedures  {Document cardiac monitor, telemetry assessment procedure when appropriate:1}  Medications Ordered in ED Medications - No data to display  ED Course/ Medical Decision Making/ A&P                           Medical Decision Making Amount and/or Complexity of Data Reviewed Labs: ordered.   ***  {Document  critical care time when appropriate:1} {Document review of labs and clinical decision tools ie heart score, Chads2Vasc2 etc:1}  {Document your independent review of radiology images, and any outside records:1} {Document your discussion with family members, caretakers, and with consultants:1} {Document social determinants of health affecting pt's care:1} {Document your decision making why or why not admission, treatments were needed:1} Final Clinical Impression(s) / ED Diagnoses Final diagnoses:  None    Rx / DC Orders ED Discharge Orders     None

## 2022-01-31 NOTE — Discharge Instructions (Addendum)
Your strep test was negative. Follow up with primary doctor for further chest pain evaluation. Your tests for your heart were reassuring.  Return for new or worsening signs or symptoms.

## 2022-01-31 NOTE — ED Triage Notes (Signed)
Pt states that yesterday she began to have congestion, cough, sore throat, pt also endorses some central CP and SOB

## 2022-01-31 NOTE — ED Provider Notes (Addendum)
Patient care signed out to follow-up troponin results with likely plan for outpatient follow-up.  Patient present with primarily cough congestion, sore throat and chest pressure.  Blood work reviewed independently showing normal BNP no signs of heart failure, negative troponin and atypical chest pain presentation.  Discussed importance of follow-up with primary doctor and cardiology as needed.  Patient is on baby aspirin each day.  Patient still having sore throat on recheck, no signs of abscess, mild exudate and minimal erythema.  Patient overall well-appearing.  Strep test added, Tylenol for pain.  EKG independently reviewed no signs of acute ischemia.  With patient being borderline diabetic we will hold on Decadron.  Reasons to return discussed patient comfortable with plan.  Strep test reviewed negative.  Golda Acre, MD 01/31/22 5747    Elnora Morrison, MD 01/31/22 [phone]

## 2022-01-31 NOTE — ED Notes (Signed)
She is awake, alert and in no distress. Dr. Reather Converse has spoken with her. We have just sent a rapid strep test to the lab.

## 2022-02-06 NOTE — Progress Notes (Incomplete)
Cardiology Office Note:    Date:  02/07/2022   ID:  Marilyn [name], DOB [birth year], MRN [id number]  PCP:  Billie Ruddy, MD  Cardiologist:  Skeet Latch, MD    Referring MD: Billie Ruddy, MD   No chief complaint on file.   History of Present Illness:    Marilyn [name] is a 56 y.o. female with a hx of hypertension, hyperlipidemia, and obesity here today for follow-up. She was initially seen 07/26/2021 for the evaluation of pericardial effusion at the request of Dr. Volanda Napoleon.   She presented to the ED 07/2021 for precordial pain. She was at work when she developed aching L arm pain and swelling. Upon arrival, she also reported R leg pain, R lower quadrant abdominal pain, and generalized chest discomfort. She was diagnosed with COVID over a month prior. She was recommended to be evaluated for possible blood clot due to persistent productive cough after the diagnosis. The pain was managed in the ED and she was encouraged to follow up with her PCP and pertinent specialists upon discharge.   She followed up with Dr. Virl Cagey with concern for deep vein thrombosis. The swelling had decreased significantly. Upper Venous DVT study 07/2021 showed no evidence of DVT or thrombosis.   She followed up with Dr. Volanda Napoleon and stated the L arm edema was improving. Ever since, she noticed discoloration and mild tingling in her L hand. She reported increased burning in her central chest and tightness in her upper throat. She started taking herbal heartburn medication. She endorsed random intermittent chest pain and R lateral face and hand edema. Chest CTA 07/2021 revealed trace pericardial effusion and aortic arthrosclerosis. She was referred to cardiology.  She reported exertional dyspnea. On her chest CT there was no evidence of coronary calcification and she was negative for PE. She had an Echo  08/2021 that revealed LVEF 60-65% with mild LVH and grade 1 diastolic dysfunction. She had a small  pericardial effusion but no tamponade. She followed up with Laurann Montana, NP and reported palpitations. She wore a monitor that showed rare PACs and PVCs.  Today, she reports feeling fatigued and out of breath. She will often become short winded and breathe heavily while talking. In clinic today she has gained some weight which she believes is exacerbating her symptoms, currently at 203 lbs. At home her weight was 196 lbs recently; she continues to work on weight loss. Also, she is concerned about swelling and puffiness in her right hand, with pain if she tries to bend her fingers too much. This has waxed and waned, but not improved. She was prescribed meloxicam but she felt "crazy" and her heart raced so she stopped taking it. Otherwise, she continues to have intermittent chest pressure. She also complains of random rapid palpitations while sitting or lying in bed. Her palpitations may occur every few days. Sometimes she develops jittery feelings which she relates to taking metoprolol or atorvastatin. Regarding her regimen, she tries to take synthroid at 4-5 AM, amlodipine at 7-8 AM, metoprolol between 5-6 PM, and atorvastatin right before bed, which may be as late as 10-11 PM. She also reports taking her 81 mg ASA intermittently. She was not certain if she could take her medications at the same time due to being wary of contraindications with synthroid. For exercise she will go for walks routinely. Lately she has only been eating twice a day due to loss of appetite. She may have vegetables or a sandwich. Later this  month she is scheduled for a sleep study. She denies any lightheadedness, headaches, syncope, orthopnea, or PND.   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   Neck pain 09/09/2011   Obesity 04/11/2014   Pericardial effusion 02/07/2022    Past Surgical History:  Procedure Laterality Date   BREAST BIOPSY Right 12/2014   BREAST EXCISIONAL BIOPSY Right 2016   BREAST LUMPECTOMY WITH RADIOACTIVE SEED LOCALIZATION Right 02/25/2015   Procedure: BREAST LUMPECTOMY WITH RADIOACTIVE SEED LOCALIZATION;  Surgeon: Alphonsa Overall, MD;  Location: [facility];  Service: General;  Laterality: Right;   BREAST REDUCTION SURGERY  1986   BREAST SURGERY     broken jaw     CARPAL TUNNEL RELEASE  2001   FRACTURE SURGERY     REDUCTION MAMMAPLASTY Bilateral    THYROIDECTOMY     TUBAL LIGATION  1987    Current Medications: Current Meds  Medication Sig   acetaminophen (TYLENOL) 500 MG tablet Take 500 mg by mouth as needed for moderate pain (arthritis pain).   albuterol (VENTOLIN HFA) 108 (90 Base) MCG/ACT inhaler Inhale 2 puffs into the lungs every 4 (four) hours as needed for wheezing or shortness of breath (cough, shortness of breath or wheezing.).   amLODipine (NORVASC) 10 MG tablet Take 1 tablet (10 mg total) by mouth daily.   aspirin EC 81 MG tablet Take 1 tablet (81 mg total) by mouth daily. Swallow whole.   atorvastatin (LIPITOR) 20 MG tablet TAKE ONE TABLET BY MOUTH DAILY   Beclomethasone Dipropionate 40 MCG/ACT AERS Place 1 spray into the nose in the morning and at bedtime.   Blood Glucose Monitoring Suppl ([place]) w/Device KIT Use to check blood glucose TID or prn   Blood Pressure Monitoring (COMFORT TOUCH BP CUFF/LARGE) MISC Please measure blood pressure 1 time per day 1-2 hours after taking medication and resting for 5-10 minutes, please keep a log and bring to all future appointments. DX I10   Cholecalciferol (VITAMIN D)  125 MCG (5000 UT) CAPS Take 1 tablet by mouth daily.   cyclobenzaprine (FLEXERIL) 5 MG tablet Take 1 tablet (5 mg total) by mouth 3 (three) times daily as needed.   EPIPEN 2-PAK 0.3 MG/0.3ML SOAJ injection Inject 0.3 mg into the muscle as needed for anaphylaxis.   glucose blood (ONETOUCH VERIO) test strip USE ONE STRIP TO TEST THREE TIMES A DAY   Lancets (ONETOUCH DELICA PLUS [id number]) MISC USE 1 LANCET TO TEST BLOOD SUGAR ONCE DAILY   levothyroxine (SYNTHROID) 100 MCG tablet Take 1 tablet (100 mcg total) by mouth daily.   metoprolol succinate (TOPROL XL) 25 MG 24 hr tablet Take 1 tablet (25 mg total) by mouth daily.   omeprazole (PRILOSEC) 40 MG capsule Take 1 capsule (40 mg total) by mouth 2 (two) times daily.   ondansetron (ZOFRAN-ODT) 4 MG disintegrating tablet Take 1 tablet (4 mg total) by mouth every 8 (eight) hours as needed for nausea or vomiting.  vitamin B-12 (CYANOCOBALAMIN) 500 MCG tablet Take 500 mcg by mouth daily.   [DISCONTINUED] meloxicam (MOBIC) 15 MG tablet Take 1 tablet (15 mg total) by mouth daily.     Allergies:   Promethazine-dm, Blain Pais allergy], Codeine, Fluticasone propionate, Promethazine, and Flonase [fluticasone propionate]   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[employer]    Employer: [employer]  Tobacco Use   Smoking status: Former    Packs/day: 0.25    Years: 3.00    Total pack years: 0.75    Types: Cigarettes    Quit date: 06/04/1980    Years since quitting: 41.7   Smokeless tobacco: Never  Vaping Use   Vaping Use: Never used  Substance and Sexual Activity   Alcohol use: No    Alcohol/week: 0.0 standard drinks of alcohol   Drug use: No   Sexual activity: Not Currently    Comment: 1st intercourse 12 yo-5 partners  Other Topics Concern   Not  on file  Social History Narrative   Married   Social Determinants of Health   Financial Resource Strain: Low Risk  (07/26/2021)   Overall Financial Resource Strain (CARDIA)    Difficulty of Paying Living Expenses: Not hard at all  Food Insecurity: No Food Insecurity (07/26/2021)   Hunger Vital Sign    Worried About Running Out of Food in the Last Year: Never true    [place] in the Last Year: Never true  Transportation Needs: No Transportation Needs (07/26/2021)   PRAPARE - Transportation    Lack of Transportation (Medical): No    Lack of Transportation (Non-Medical): No  Physical Activity: Insufficiently Active (07/26/2021)   Exercise Vital Sign    Days of Exercise per Week: 2 days    Minutes of Exercise per Session: 40 min  Stress: Not on file  Social Connections: Not on file     Family History: The patient's family history includes Asthma in her maternal grandfather and mother; Breast cancer (age of onset: 47) in her sister; Diabetes in her father and maternal grandmother; Heart attack in her maternal grandfather; Heart attack (age of onset: 19) in her maternal grandmother; Heart disease in her maternal grandmother; Hyperlipidemia in her father; Hypertension in her father; Stroke in her maternal grandfather and maternal grandmother; Thyroid disease in her sister. There is no history of Colon cancer.  ROS:   Please see the history of present illness.    (+) Fatigue (+) Shortness of breath (+) Swelling/puffiness of right hand with intermittent pain (+) Chest pressure (+) Palpitations All other systems reviewed and negative.   EKGs/Labs/Other Studies Reviewed:    The following studies were reviewed today:  Monitor  12/2021:  14 Day Event Monitor   Quality: Fair.  Baseline artifact. Predominant rhythm: Sinus rhythm Average heart rate: 89 bpm Max heart rate: 140 bpm Min heart rate: 56 bpm Pauses >2.5 seconds: None   Rare PACs.  1.2% PVCs occasional ventricular  couplets, ventricular bigeminy and ventricular trigeminy  Echo  08/03/2021: Sonographer Comments: Technically difficult study due to poor echo windows and patient is morbidly obese. Image acquisition challenging due to patient body habitus and Image acquisition challenging due to respiratory motion.  IMPRESSIONS    1. Left ventricular ejection fraction,  by estimation, is 60 to 65%. The  left ventricle has normal function. The left ventricle has no regional  wall motion abnormalities. There is mild concentric left ventricular  hypertrophy. Left ventricular diastolic  parameters are consistent with Grade I diastolic dysfunction (impaired  relaxation).   2. Right ventricular systolic function is normal. The right ventricular  size is normal.   3. Left atrial size was mildly dilated.   4. A small pericardial effusion is present. The pericardial effusion is  circumferential.   5. The mitral valve is normal in structure. Mild mitral valve  regurgitation. No evidence of mitral stenosis.   6. The aortic valve is tricuspid. There is mild calcification of the  aortic valve. There is mild thickening of the aortic valve. Aortic valve  regurgitation is trivial. Aortic valve sclerosis/calcification is present,  without any evidence of aortic  stenosis.   7. The inferior vena cava is normal in size with greater than 50%  respiratory variability, suggesting right atrial pressure of 3 mmHg.   Upper Venous Study 07/07/21 Right:  No evidence of thrombosis in the subclavian.     Left:  No evidence of deep vein thrombosis in the upper extremity. No evidence of superficial vein thrombosis in the upper extremity.   CTA Chest 07/05/21 IMPRESSION: 1. No evidence of pulmonary embolus. 2. Trace pericardial effusion. 3. Minimal sigmoid diverticulosis without diverticulitis. 4. Fibroid uterus. 5.  Aortic Atherosclerosis (ICD10-I70.0).  EKG:  EKG is personally reviewed. 02/07/2022:  EKG was not  ordered. 11/17/2021 Laurann Montana, NP):  ST 108 bpm with PV's. No acute ST/T wave changes.   Recent Labs: 07/11/2021: ALT 23 11/17/2021: BUN 14; Creatinine, Ser 0.99; Magnesium 2.4; Potassium 4.0; Sodium 140 01/12/2022: TSH 0.80 01/31/2022: B Natriuretic Peptide 4.7; Hemoglobin 11.5; Platelets 231   Recent Lipid Panel    Component Value Date/Time   CHOL 157 02/20/2021 0751   TRIG 65 02/20/2021 0751   HDL 46 02/20/2021 0751   CHOLHDL 3.4 02/20/2021 0751   CHOLHDL 4 07/17/2019 0732   VLDL 17.8 07/17/2019 0732   LDLCALC 98 02/20/2021 0751   LDLDIRECT 165.3 12/15/2012 1153    Physical Exam:    VS:  BP 112/70 (BP Location: Right Arm, Patient Position: Sitting, Cuff Size: Large)   Pulse 91   Ht '5\' 3"'  (1.6 m)   Wt 203 lb 1.6 oz (92.1 kg)   LMP 02/21/2013   SpO2 96%   BMI 35.98 kg/m  , BMI Body mass index is 35.98 kg/m. GENERAL:  Well appearing HEENT: Pupils equal round and reactive, fundi not visualized, oral mucosa unremarkable, R-sided lower facial edema NECK:  No jugular venous distention, waveform within normal limits, carotid upstroke brisk and symmetric, no bruits, no thyromegaly LUNGS:  Clear to auscultation bilaterally HEART:  RRR.  PMI not displaced or sustained,S1 and S2 within normal limits, no S3, no S4, no clicks, no rubs, no murmurs ABD:  Flat, positive bowel sounds normal in frequency in pitch, no bruits, no rebound, no guarding, no midline pulsatile mass, no hepatomegaly, no splenomegaly EXT:  2 plus pulses throughout, no edema, no cyanosis no clubbing SKIN:  No rashes no nodules NEURO:  Cranial nerves II through XII grossly intact, motor grossly intact throughout PSYCH:  Cognitively intact, oriented to person place and time  ASSESSMENT:    1. Chronic diastolic heart failure ([place])   2. Hyperlipidemia LDL goal <70   3. Essential hypertension   4. Joint swelling   5. Elevated sed rate  6. Pericardial effusion   7. Hyperlipidemia associated with type 2 diabetes  mellitus ([place])   8. Aortic atherosclerosis ([facility])     PLAN:    Essential hypertension Blood pressures well controlled.  Continue amlodipine and metoprolol.  Pericardial effusion She has a small pericardial effusion with no evidence of tamponade.  She also has a history of bilateral carpal tunnel and joint swelling.  I suspect that she has an autoimmune etiology.  However we need to also rule out ATTR amyloidosis.  We will get a PYP scan to do this.  Sed rate is elevated.  Recommend getting a rheumatology evaluation.  Hyperlipidemia associated with type 2 diabetes mellitus ([facility]) LDL goal is less than 70.  She will come back for fasting lipids and a CMP.  Aortic atherosclerosis ([facility]) LDL goal less than 70.  Continue aspirin and atorvastatin.  She will come back for fasting lipids and a CMP.   Disposition:  FU with Amad Mau C. Oval Linsey, MD, [facility] in 3-4 months.  Medication Adjustments/Labs and Tests Ordered: Current medicines are reviewed at length with the patient today.  Concerns regarding medicines are outlined above.   Orders Placed This Encounter  Procedures   Lipid panel   Comprehensive metabolic panel   Ambulatory referral to Rheumatology   MYOCARDIAL AMYLOID IMAGING PLANAR AND SPECT   No orders of the defined types were placed in this encounter.  I,Mathew Stumpf,acting as a Education administrator for Skeet Latch, MD.,have documented all relevant documentation on the behalf of Skeet Latch, MD,as directed by  Skeet Latch, MD while in the presence of Skeet Latch, MD.  I, [place] Oval Linsey, MD have reviewed all documentation for this visit.  The documentation of the exam, diagnosis, procedures, and orders on 02/07/2022 are all accurate and complete.  Signed, Skeet Latch, MD  02/07/2022 5:35 PM    [facility]

## 2022-02-07 ENCOUNTER — Ambulatory Visit (INDEPENDENT_AMBULATORY_CARE_PROVIDER_SITE_OTHER): Payer: BC Managed Care – PPO | Admitting: Cardiovascular Disease

## 2022-02-07 ENCOUNTER — Encounter (HOSPITAL_BASED_OUTPATIENT_CLINIC_OR_DEPARTMENT_OTHER): Payer: Self-pay | Admitting: Cardiovascular Disease

## 2022-02-07 VITALS — BP 112/70 | HR 91 | Ht 63.0 in | Wt 203.1 lb

## 2022-02-07 DIAGNOSIS — I5032 Chronic diastolic (congestive) heart failure: Secondary | ICD-10-CM | POA: Diagnosis not present

## 2022-02-07 DIAGNOSIS — I10 Essential (primary) hypertension: Secondary | ICD-10-CM | POA: Diagnosis not present

## 2022-02-07 DIAGNOSIS — I700 Atherosclerosis of aorta: Secondary | ICD-10-CM

## 2022-02-07 DIAGNOSIS — M2540 Effusion, unspecified joint: Secondary | ICD-10-CM

## 2022-02-07 DIAGNOSIS — R700 Elevated erythrocyte sedimentation rate: Secondary | ICD-10-CM

## 2022-02-07 DIAGNOSIS — I3139 Other pericardial effusion (noninflammatory): Secondary | ICD-10-CM

## 2022-02-07 DIAGNOSIS — E785 Hyperlipidemia, unspecified: Secondary | ICD-10-CM

## 2022-02-07 DIAGNOSIS — E1169 Type 2 diabetes mellitus with other specified complication: Secondary | ICD-10-CM

## 2022-02-07 HISTORY — DX: Atherosclerosis of aorta: I70.0

## 2022-02-07 HISTORY — DX: Other pericardial effusion (noninflammatory): I31.39

## 2022-02-07 NOTE — Assessment & Plan Note (Signed)
Blood pressures well controlled.  Continue amlodipine and metoprolol.

## 2022-02-07 NOTE — Assessment & Plan Note (Signed)
LDL goal less than 70.  Continue aspirin and atorvastatin.  She will come back for fasting lipids and a CMP.

## 2022-02-07 NOTE — Assessment & Plan Note (Signed)
LDL goal is less than 70.  She will come back for fasting lipids and a CMP.

## 2022-02-07 NOTE — Patient Instructions (Addendum)
Medication Instructions:  Your physician recommends that you continue on your current medications as directed. Please refer to the Current Medication list given to you today.   *If you need a refill on your cardiac medications before your next appointment, please call your pharmacy*  Lab Work: FASTING LP/CMET SOON   If you have labs (blood work) drawn today and your tests are completely normal, you will receive your results only by: [place] (if you have MyChart) OR A paper copy in the mail If you have any lab test that is abnormal or we need to change your treatment, we will call you to review the results.  Testing/Procedures: PYP SCAN SOON   Follow-Up: At [facility], you and your health needs are our priority.  As part of our continuing mission to provide you with exceptional heart care, we have created designated Provider Care Teams.  These Care Teams include your primary Cardiologist (physician) and Advanced Practice Providers (APPs -  Physician Assistants and Nurse Practitioners) who all work together to provide you with the care you need, when you need it.  We recommend signing up for the patient portal called "MyChart".  Sign up information is provided on this After Visit Summary.  MyChart is used to connect with patients for Virtual Visits (Telemedicine).  Patients are able to view lab/test results, encounter notes, upcoming appointments, etc.  Non-urgent messages can be sent to your provider as well.   To learn more about what you can do with MyChart, go to [email or web address].    Your next appointment:   3 month(s)  The format for your next appointment:   In Person  Provider:   Skeet Latch, MD    You have been referred to RHEUMATOLOGY

## 2022-02-07 NOTE — Assessment & Plan Note (Addendum)
She has a small pericardial effusion with no evidence of tamponade.  She also has a history of bilateral carpal tunnel and joint swelling.  I suspect that she has an autoimmune etiology.  However we need to also rule out ATTR amyloidosis.  We will get a PYP scan to do this.  Sed rate is elevated.  Recommend getting a rheumatology evaluation.

## 2022-02-13 ENCOUNTER — Telehealth (HOSPITAL_BASED_OUTPATIENT_CLINIC_OR_DEPARTMENT_OTHER): Payer: Self-pay | Admitting: *Deleted

## 2022-02-13 NOTE — Telephone Encounter (Signed)
At office visit with Dr Oval Linsey she wanted patient referred to Rheumatologist  Referred to Dr Arlean Hopping office however they are higher copay for patients with her [employer] coverage Left message to call back to discuss

## 2022-02-15 ENCOUNTER — Encounter: Payer: Self-pay | Admitting: Family

## 2022-02-15 ENCOUNTER — Other Ambulatory Visit: Payer: Self-pay

## 2022-02-15 ENCOUNTER — Telehealth: Payer: Self-pay | Admitting: Family

## 2022-02-15 ENCOUNTER — Ambulatory Visit: Payer: BC Managed Care – PPO | Admitting: Family

## 2022-02-15 VITALS — BP 126/85 | HR 90 | Temp 98.1°F | Ht 63.0 in | Wt 197.0 lb

## 2022-02-15 DIAGNOSIS — U071 COVID-19: Secondary | ICD-10-CM

## 2022-02-15 DIAGNOSIS — R059 Cough, unspecified: Secondary | ICD-10-CM

## 2022-02-15 DIAGNOSIS — R0602 Shortness of breath: Secondary | ICD-10-CM | POA: Diagnosis not present

## 2022-02-15 LAB — POCT INFLUENZA A/B
Influenza A, POC: NEGATIVE
Influenza B, POC: NEGATIVE

## 2022-02-15 LAB — POC COVID19 BINAXNOW: SARS Coronavirus 2 Ag: POSITIVE — AB

## 2022-02-15 MED ORDER — MOLNUPIRAVIR EUA 200MG CAPSULE: 4.0000 | ORAL_CAPSULE | Freq: Two times a day (BID) | ORAL | 0 refills | Status: DC

## 2022-02-15 MED ORDER — MOLNUPIRAVIR EUA 200MG CAPSULE: 4.0000 | ORAL_CAPSULE | Freq: Two times a day (BID) | ORAL | 0 refills | Status: AC

## 2022-02-15 NOTE — Patient Instructions (Signed)
It was very nice to see you today!  I have sent over the antiviral medication, Molnupiravir, to treat your Covid infection.  Start this asap, and get in both doses today. OK to continue taking your OTC sinus medication or Tylenol. Drink at least 2 liters of water every day.  Let us know if you are not feeling better by next week!      PLEASE NOTE:  If you had any lab tests please let us know if you have not heard back within a few days. You may see your results on MyChart before we have a chance to review them but we will give you a call once they are reviewed by [place]. If we ordered any referrals today, please let us know if you have not heard from their office within the next week.

## 2022-02-15 NOTE — Telephone Encounter (Signed)
RX sent, I called and informed the pt. Pt gave a verbalized understanding.

## 2022-02-15 NOTE — Telephone Encounter (Signed)
Pharmacy states pt wants to keep the original rx but have it sent to another pharmacy that has it in stock.  MEDICATION: molnupiravir EUA (LAGEVRIO) 200 mg CAPS capsule  PHARMACY: [facility] [id number], [address], [place],  [address] Phone: [phone]

## 2022-02-15 NOTE — Progress Notes (Signed)
Patient ID: Marilyn [name], female    DOB: 03/25/1966, 56 y.o.   MRN: [id number]  Chief Complaint  Patient presents with   Sinus Problem    Pt c/o sinus problem since 9/12. Symptoms include Congestion, Cough(Mucus milky red to clear), body aches, ear and facial pressure and chills. Pt has tried mucinex which did help some.     HPI:      Upper Respiratory Infection: Symptoms include congestion and sinus drainage, productive cough .  Onset of symptoms was 3 days ago, unchanged since that time. She is drinking moderate amounts of fluids. Evaluation to date: none.  Treatment to date: antihistamines and cough suppressants.    Assessment & Plan:  1. Cough, unspecified type neg. for flu.  - POCT Influenza A/B - POC COVID-19  2. COVID-19 Sending Molnupiravir, pt advised of FDA label for emergency use, how to take, & SE. Advised of CDC guidelines for masking if out in public. OK to continue taking OTC sinus or pain meds. Encouraged to monitor & notify office of any worsening symptoms: increased shortness of breath, weakness, and signs of dehydration. Instructed to rest and hydrate well.   - POC COVID-19 - molnupiravir EUA (LAGEVRIO) 200 mg CAPS capsule; Take 4 capsules (800 mg total) by mouth 2 (two) times daily for 5 days.  Dispense: 40 capsule; Refill: 0  Subjective:    Outpatient Medications Prior to Visit  Medication Sig Dispense Refill   acetaminophen (TYLENOL) 500 MG tablet Take 500 mg by mouth as needed for moderate pain (arthritis pain).     albuterol (VENTOLIN HFA) 108 (90 Base) MCG/ACT inhaler Inhale 2 puffs into the lungs every 4 (four) hours as needed for wheezing or shortness of breath (cough, shortness of breath or wheezing.). 6.7 g 1   amLODipine (NORVASC) 10 MG tablet Take 1 tablet (10 mg total) by mouth daily. 90 tablet 1   aspirin EC 81 MG tablet Take 1 tablet (81 mg total) by mouth daily. Swallow whole. 90 tablet 3   atorvastatin (LIPITOR) 20 MG tablet TAKE ONE  TABLET BY MOUTH DAILY 90 tablet 1   Beclomethasone Dipropionate 40 MCG/ACT AERS Place 1 spray into the nose in the morning and at bedtime. 6.8 g 1   Blood Glucose Monitoring Suppl ([place]) w/Device KIT Use to check blood glucose TID or prn 1 kit 0   Blood Pressure Monitoring (COMFORT TOUCH BP CUFF/LARGE) MISC Please measure blood pressure 1 time per day 1-2 hours after taking medication and resting for 5-10 minutes, please keep a log and bring to all future appointments. DX I10 1 each 0   Cholecalciferol (VITAMIN D) 125 MCG (5000 UT) CAPS Take 1 tablet by mouth daily.     cyclobenzaprine (FLEXERIL) 5 MG tablet Take 1 tablet (5 mg total) by mouth 3 (three) times daily as needed. 90 tablet 1   EPIPEN 2-PAK 0.3 MG/0.3ML SOAJ injection Inject 0.3 mg into the muscle as needed for anaphylaxis. 1 each 0   glucose blood (ONETOUCH VERIO) test strip USE ONE STRIP TO TEST THREE TIMES A DAY 100 strip 2   Lancets (ONETOUCH DELICA PLUS [id number]) MISC USE 1 LANCET TO TEST BLOOD SUGAR ONCE DAILY 100 each 2   levothyroxine (SYNTHROID) 100 MCG tablet Take 1 tablet (100 mcg total) by mouth daily. 90 tablet 1   metoprolol succinate (TOPROL XL) 25 MG 24 hr tablet Take 1 tablet (25 mg total) by mouth daily. 30 tablet 5   ondansetron (ZOFRAN-ODT)  4 MG disintegrating tablet Take 1 tablet (4 mg total) by mouth every 8 (eight) hours as needed for nausea or vomiting. 8 tablet 0   vitamin B-12 (CYANOCOBALAMIN) 500 MCG tablet Take 500 mcg by mouth daily.     omeprazole (PRILOSEC) 40 MG capsule Take 1 capsule (40 mg total) by mouth 2 (two) times daily. 30 capsule 11   No facility-administered medications prior to visit.   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   Neck pain 09/09/2011   Obesity 04/11/2014   Pericardial effusion 02/07/2022   Past Surgical History:  Procedure Laterality Date   BREAST BIOPSY Right 12/2014   BREAST EXCISIONAL BIOPSY Right 2016   BREAST LUMPECTOMY WITH RADIOACTIVE SEED LOCALIZATION Right 02/25/2015   Procedure: BREAST LUMPECTOMY WITH RADIOACTIVE SEED LOCALIZATION;  Surgeon: Alphonsa Overall, MD;  Location: [facility];  Service: General;  Laterality: Right;   BREAST REDUCTION SURGERY  1986   BREAST SURGERY     broken jaw     CARPAL TUNNEL RELEASE  2001   FRACTURE SURGERY     REDUCTION MAMMAPLASTY Bilateral    THYROIDECTOMY     TUBAL LIGATION  1987   Allergies  Allergen Reactions   Promethazine-Dm    Geralyn Flash [Fish Allergy] Anaphylaxis   Codeine Nausea And Vomiting   Fluticasone Propionate Other (See Comments)   Promethazine Other (See Comments)   Flonase [Fluticasone Propionate] Other (See Comments)    Nose bleeds      Objective:    Physical Exam Vitals and nursing note reviewed.  Constitutional:      Appearance: Normal appearance.  HENT:     Right Ear: Tympanic membrane and ear canal normal.     Left Ear: Tympanic membrane and ear canal normal.     Nose:     Right Turbinates: Swollen.     Left Turbinates: Swollen.     Right Sinus: No frontal sinus tenderness.     Left Sinus: No frontal sinus tenderness.     Mouth/Throat:     Mouth: Mucous membranes are moist.     Pharynx: No pharyngeal swelling, oropharyngeal exudate, posterior oropharyngeal erythema or uvula swelling.  Cardiovascular:     Rate and Rhythm: Normal rate and regular rhythm.  Pulmonary:     Effort: Pulmonary effort is normal.     Breath sounds: Normal breath sounds.  Musculoskeletal:        General: Normal range of motion.   Lymphadenopathy:     Head:     Right side of head: No preauricular or posterior auricular adenopathy.     Left side of head: No preauricular or posterior auricular adenopathy.     Cervical: No cervical adenopathy.  Skin:    General: Skin is warm and dry.  Neurological:     Mental Status: She is alert.  Psychiatric:        Mood and Affect: Mood normal.        Behavior: Behavior normal.  BP 126/85 (BP Location: Left Arm, Patient Position: Sitting, Cuff Size: Large)   Pulse 90   Temp 98.1 F (36.7 C) (Temporal)   Ht '5\' 3"'  (1.6 m)   Wt 197 lb (89.4 kg)   LMP 02/21/2013   SpO2 99%   BMI 34.90 kg/m  Wt Readings from Last 3 Encounters:  02/15/22 197 lb (89.4 kg)  02/07/22 203 lb 1.6 oz (92.1 kg)  01/12/22 198 lb 4 oz (89.9 kg)       Jeanie Sewer, NP

## 2022-02-15 NOTE — Telephone Encounter (Signed)
Calling to let office know they do not have molnupiravir 200 mg in stock.  States they do have paxlovid in stock.    Please follow up with pharmacy in regard.

## 2022-02-23 ENCOUNTER — Ambulatory Visit (HOSPITAL_BASED_OUTPATIENT_CLINIC_OR_DEPARTMENT_OTHER): Payer: BC Managed Care – PPO | Attending: Cardiology | Admitting: Cardiology

## 2022-02-23 VITALS — Ht 63.0 in | Wt 197.0 lb

## 2022-02-23 DIAGNOSIS — G4733 Obstructive sleep apnea (adult) (pediatric): Secondary | ICD-10-CM | POA: Insufficient documentation

## 2022-02-23 DIAGNOSIS — G4736 Sleep related hypoventilation in conditions classified elsewhere: Secondary | ICD-10-CM | POA: Insufficient documentation

## 2022-02-25 NOTE — Procedures (Signed)
      Patient Name: Marilyn, [name] Date: 02/23/2022 Gender: Female D.O.B: 1965-11-07 Age (years): 63 Referring Provider: Fransico Him MD, ABSM Height (inches): 63 Interpreting Physician: Fransico Him MD, ABSM Weight (lbs): 197 RPSGT: Jorge Ny BMI: 35 MRN: [id number] Neck Size: 16.00  CLINICAL INFORMATION The patient is referred for a CPAP titration to treat sleep apnea.  SLEEP STUDY TECHNIQUE As per the AASM Manual for the Scoring of Sleep and Associated Events v2.3 (April 2016) with a hypopnea requiring 4% desaturations.  The channels recorded and monitored were frontal, central and occipital EEG, electrooculogram (EOG), submentalis EMG (chin), nasal and oral airflow, thoracic and abdominal wall motion, anterior tibialis EMG, snore microphone, electrocardiogram, and pulse oximetry. Continuous positive airway pressure (CPAP) was initiated at the beginning of the study and titrated to treat sleep-disordered breathing.  MEDICATIONS Medications self-administered by patient taken the night of the study : ATORVASTATIN, METOPROLOL SUCCINATE  TECHNICIAN COMMENTS Comments added by technician: None Comments added by scorer: N/A  RESPIRATORY PARAMETERS Optimal PAP Pressure (cm): 6  AHI at Optimal Pressure (/hr):2.78 Overall Minimal O2 (%):87.0  Supine % at Optimal Pressure (%): 90.0 Minimal O2 at Optimal Pressure (%): 87.0   SLEEP ARCHITECTURE The study was initiated at 11:08:34 PM and ended at 5:30:24 AM.  Sleep onset time was 3.2 minutes and the sleep efficiency was 97.1%. The total sleep time was 370.7 minutes.  The patient spent 1.6% of the night in stage N1 sleep, 68.3% in stage N2 sleep, 0.0% in stage N3 and 30.1% in REM.Stage REM latency was 30.0 minutes  Wake after sleep onset was 8.0. Alpha intrusion was absent. Supine sleep was 0.00%.  CARDIAC DATA The 2 lead EKG demonstrated sinus rhythm. The mean heart rate was 73.6 beats per minute. Other EKG  findings include: None.  LEG MOVEMENT DATA The total Periodic Limb Movements of Sleep (PLMS) were 0. The PLMS index was 0.0. A PLMS index of <15 is considered normal in adults.  IMPRESSIONS - An optimal PAP pressure or 7cm H2O was obtained. - Central sleep apnea was not noted during this titration (CAI = 2.3/h). - Mild oxygen desaturations were observed during this titration (min O2 = 87.0%). - No snoring was audible during this study. - No cardiac abnormalities were observed during this study. - Clinically significant periodic limb movements were not noted during this study. Arousals associated with PLMs were rare.  DIAGNOSIS - Obstructive Sleep Apnea (G47.33)  RECOMMENDATIONS - Recommend a trial of CPAP at 7cm H2O with heated humidity and mask of choice.  - Avoid alcohol, sedatives and other CNS depressants that may worsen sleep apnea and disrupt normal sleep architecture. - Sleep hygiene should be reviewed to assess factors that may improve sleep quality. - Weight management and regular exercise should be initiated or continued. - Return to [facility] Center for re-evaluation after 6 weeks of therapy  [Electronically signed] 02/25/2022 08:20 PM  Fransico Him MD, ABSM Diplomate, American Board of Sleep Medicine

## 2022-02-26 LAB — LIPID PANEL
Chol/HDL Ratio: 3.7 ratio (ref 0.0–4.4)
Cholesterol, Total: 167 mg/dL (ref 100–199)
HDL: 45 mg/dL (ref >39)
LDL Chol Calc (NIH): 105 mg/dL — ABNORMAL HIGH (ref 0–99)
Triglycerides: 90 mg/dL (ref 0–149)
VLDL Cholesterol Cal: 17 mg/dL (ref 5–40)

## 2022-02-26 LAB — COMPREHENSIVE METABOLIC PANEL
ALT: 19 IU/L (ref 0–32)
AST: 21 IU/L (ref 0–40)
Albumin/Globulin Ratio: 1.3 (ref 1.2–2.2)
Albumin: 4.2 g/dL (ref 3.8–4.9)
Alkaline Phosphatase: 83 IU/L (ref 44–121)
BUN/Creatinine Ratio: 13 (ref 9–23)
BUN: 12 mg/dL (ref 6–24)
Bilirubin Total: 0.2 mg/dL (ref 0.0–1.2)
CO2: 22 mmol/L (ref 20–29)
Calcium: 8.4 mg/dL — ABNORMAL LOW (ref 8.7–10.2)
Chloride: 105 mmol/L (ref 96–106)
Creatinine, Ser: 0.9 mg/dL (ref 0.57–1.00)
Globulin, Total: 3.2 g/dL (ref 1.5–4.5)
Glucose: 102 mg/dL — ABNORMAL HIGH (ref 70–99)
Potassium: 4.2 mmol/L (ref 3.5–5.2)
Sodium: 140 mmol/L (ref 134–144)
Total Protein: 7.4 g/dL (ref 6.0–8.5)
eGFR: 74 mL/min/{1.73_m2} (ref >59)

## 2022-03-01 ENCOUNTER — Telehealth (HOSPITAL_COMMUNITY): Payer: Self-pay | Admitting: *Deleted

## 2022-03-01 NOTE — Telephone Encounter (Signed)
No call back from patient, mychart message sent

## 2022-03-01 NOTE — Telephone Encounter (Signed)
Close encounter 

## 2022-03-02 ENCOUNTER — Telehealth: Payer: Self-pay | Admitting: *Deleted

## 2022-03-02 ENCOUNTER — Ambulatory Visit (HOSPITAL_COMMUNITY)
Admission: RE | Admit: 2022-03-02 | Discharge: 2022-03-02 | Disposition: A | Discharge status: Home / Self Care | Payer: BC Managed Care – PPO | Source: Ambulatory Visit | Attending: Cardiovascular Disease | Admitting: Cardiovascular Disease

## 2022-03-02 DIAGNOSIS — I5032 Chronic diastolic (congestive) heart failure: Secondary | ICD-10-CM | POA: Insufficient documentation

## 2022-03-02 MED ORDER — TECHNETIUM TC 99M PYROPHOSPHATE
20.9000 | Freq: Once | INTRAVENOUS | Status: AC
  Administered 2022-03-02: 20.9 via INTRAVENOUS

## 2022-03-02 NOTE — Telephone Encounter (Signed)
The patient has been notified of the result. Left detailed message on voicemail and informed patient to call back..Tommy Goostree Green, CMA   

## 2022-03-02 NOTE — Telephone Encounter (Signed)
-----   Message from Lauralee Evener, [place] sent at 02/26/2022 10:49 AM EDT -----  ----- Message ----- From: Sueanne Margarita, MD Sent: 02/25/2022   8:25 PM EDT To: Cv Div Sleep Studies  Please let patient know that they had a successful PAP titration and let DME know that orders are in EPIC.  Please set up 6 week OV with me.

## 2022-03-02 NOTE — Telephone Encounter (Signed)
Return Call :  Upon patient request DME selection is [facility]. Patient understands he will be contacted by [facility] to set up his cpap. Patient understands to call if [facility] does not contact him with new setup in a timely manner. Patient understands they will be called once confirmation has been received from [facility]/ that they have received their new machine to schedule 10 week follow up appointment.   [facility] notified of new cpap order  Please add to airview Patient was grateful for the call and thanked me.

## 2022-03-09 ENCOUNTER — Telehealth (HOSPITAL_BASED_OUTPATIENT_CLINIC_OR_DEPARTMENT_OTHER): Payer: Self-pay | Admitting: *Deleted

## 2022-03-09 NOTE — Telephone Encounter (Deleted)
-----   Message from Loel Dubonnet, NP sent at 03/02/2022  8:44 PM EDT ----- LDL (bad cholesterol) of 105 with goal less than 70. Increase Atorvastatin to '40mg'$  daily, increase physical activity, follow cholesterol lowering diet. Repeat FLP/LFT in 3 months. Normal kidney and liver function.

## 2022-03-09 NOTE — Telephone Encounter (Signed)
Need to advise of labs and Amyloid test  Also need to advise of Rheumatology referral, see phone note 9/12  Left message to call back

## 2022-03-09 NOTE — Telephone Encounter (Signed)
-----   Message from Skeet Latch, MD sent at 03/07/2022  3:43 AM EDT ----- Findings were not consistent with amyloidosis.

## 2022-03-09 NOTE — Telephone Encounter (Signed)
-----   Message from Loel Dubonnet, NP sent at 03/02/2022  8:44 PM EDT ----- LDL (bad cholesterol) of 105 with goal less than 70. Increase Atorvastatin to '40mg'$  daily, increase physical activity, follow cholesterol lowering diet. Repeat FLP/LFT in 3 months. Normal kidney and liver function.

## 2022-03-16 NOTE — Telephone Encounter (Signed)
Me     03/09/22 12:47 PM Note Need to advise of labs and Amyloid test   Also need to advise of Rheumatology referral, see phone note 9/12   Left message to call back       No return call, will remove from basket

## 2022-04-03 ENCOUNTER — Telehealth (HOSPITAL_BASED_OUTPATIENT_CLINIC_OR_DEPARTMENT_OTHER): Payer: Self-pay | Admitting: Family

## 2022-04-03 NOTE — Telephone Encounter (Signed)
Hey have you been trying to reach this patient?

## 2022-04-03 NOTE — Telephone Encounter (Signed)
Follow Up:      Patient is returning Marilyn [name]'s call.

## 2022-04-03 NOTE — Telephone Encounter (Signed)
Hey there, I think this patient may have been returning your calls.

## 2022-04-03 NOTE — Telephone Encounter (Addendum)
See phones note 9/12 and 10/6  Left message to call back

## 2022-04-06 NOTE — Telephone Encounter (Signed)
Left message to call back  

## 2022-04-09 NOTE — Telephone Encounter (Signed)
Left message to call back  

## 2022-04-09 NOTE — Telephone Encounter (Signed)
Here ya go!  

## 2022-04-09 NOTE — Telephone Encounter (Signed)
Follow Up:    Patient is returning Marilyn [name]'s call from last week.

## 2022-04-13 ENCOUNTER — Encounter: Payer: Self-pay | Admitting: Nurse Practitioner

## 2022-04-13 ENCOUNTER — Ambulatory Visit: Payer: BC Managed Care – PPO | Admitting: Nurse Practitioner

## 2022-04-13 VITALS — BP 140/84 | HR 104 | Temp 97.1°F | Ht 63.0 in | Wt 196.6 lb

## 2022-04-13 DIAGNOSIS — M19049 Primary osteoarthritis, unspecified hand: Secondary | ICD-10-CM

## 2022-04-13 NOTE — Patient Instructions (Signed)
Continue meloxicam once a day with food Ok to take with tylenol '650mg'$  every 8hrs as needed Alternate between warm and cold compress as needed Avoid all OTC NSIADs while taking meloxicam

## 2022-04-13 NOTE — Progress Notes (Signed)
Established Patient Visit  Patient: Marilyn [name]   DOB: [birth year]   56 y.o. Female  MRN: [id number] Visit Date: 04/13/2022  Subjective:    Chief Complaint  Patient presents with   Office Visit    Hand & legs swelling , mainly in right  No other concerns    Hand Pain  The incident occurred 5 to 7 days ago. There was no injury mechanism. The pain is present in the right hand and right fingers. The quality of the pain is described as aching. The pain does not radiate. The pain is moderate. The pain has been Intermittent since the incident. Pertinent negatives include no chest pain, muscle weakness, numbness or tingling. The symptoms are aggravated by movement and palpation. She has tried NSAIDs for the symptoms. The treatment provided significant relief.  Use of mobic (does not remember dose). Right hand dorminant. Uric acid, CRP checked 01/2022: normal   Reviewed medical, surgical, and social history today  Medications: Outpatient Medications Prior to Visit  Medication Sig   acetaminophen (TYLENOL) 500 MG tablet Take 500 mg by mouth as needed for moderate pain (arthritis pain).   albuterol (VENTOLIN HFA) 108 (90 Base) MCG/ACT inhaler Inhale 2 puffs into the lungs every 4 (four) hours as needed for wheezing or shortness of breath (cough, shortness of breath or wheezing.).   amLODipine (NORVASC) 10 MG tablet Take 1 tablet (10 mg total) by mouth daily.   aspirin EC 81 MG tablet Take 1 tablet (81 mg total) by mouth daily. Swallow whole.   atorvastatin (LIPITOR) 20 MG tablet TAKE ONE TABLET BY MOUTH DAILY   Beclomethasone Dipropionate 40 MCG/ACT AERS Place 1 spray into the nose in the morning and at bedtime.   Blood Glucose Monitoring Suppl ([place]) w/Device KIT Use to check blood glucose TID or prn   Blood Pressure Monitoring (COMFORT TOUCH BP CUFF/LARGE) MISC Please measure blood pressure 1 time per day 1-2 hours after taking medication and  resting for 5-10 minutes, please keep a log and bring to all future appointments. DX I10   Cholecalciferol (VITAMIN D) 125 MCG (5000 UT) CAPS Take 1 tablet by mouth daily.   cyclobenzaprine (FLEXERIL) 5 MG tablet Take 1 tablet (5 mg total) by mouth 3 (three) times daily as needed.   EPIPEN 2-PAK 0.3 MG/0.3ML SOAJ injection Inject 0.3 mg into the muscle as needed for anaphylaxis.   glucose blood (ONETOUCH VERIO) test strip USE ONE STRIP TO TEST THREE TIMES A DAY   Lancets (ONETOUCH DELICA PLUS [id number]) MISC USE 1 LANCET TO TEST BLOOD SUGAR ONCE DAILY   levothyroxine (SYNTHROID) 100 MCG tablet Take 1 tablet (100 mcg total) by mouth daily.   metoprolol succinate (TOPROL XL) 25 MG 24 hr tablet Take 1 tablet (25 mg total) by mouth daily.   vitamin B-12 (CYANOCOBALAMIN) 500 MCG tablet Take 500 mcg by mouth daily.   omeprazole (PRILOSEC) 40 MG capsule Take 1 capsule (40 mg total) by mouth 2 (two) times daily.   ondansetron (ZOFRAN-ODT) 4 MG disintegrating tablet Take 1 tablet (4 mg total) by mouth every 8 (eight) hours as needed for nausea or vomiting. (Patient not taking: Reported on 04/13/2022)   No facility-administered medications prior to visit.   Reviewed past medical and social history.   ROS per HPI above  Last metabolic panel Lab Results  Component Value Date   GLUCOSE 102 (H) 02/26/2022  NA 140 02/26/2022   K 4.2 02/26/2022   CL 105 02/26/2022   CO2 22 02/26/2022   BUN 12 02/26/2022   CREATININE 0.90 02/26/2022   EGFR 74 02/26/2022   CALCIUM 8.4 (L) 02/26/2022   PROT 7.4 02/26/2022   ALBUMIN 4.2 02/26/2022   LABGLOB 3.2 02/26/2022   AGRATIO 1.3 02/26/2022   BILITOT 0.2 02/26/2022   ALKPHOS 83 02/26/2022   AST 21 02/26/2022   ALT 19 02/26/2022   ANIONGAP 7 07/05/2021        Objective:  BP (!) 140/84 (BP Location: Right Arm, Patient Position: Sitting, Cuff Size: Normal)   Pulse (!) 104   Temp (!) 97.1 F (36.2 C) (Temporal)   Ht _0  (1.6 m)   Wt 196 lb 9.6 oz  (89.2 kg)   LMP 02/21/2013   SpO2 98%   BMI 34.83 kg/m      Physical Exam Vitals reviewed.  Cardiovascular:     Rate and Rhythm: Normal rate.     Pulses: Normal pulses.  Pulmonary:     Effort: Pulmonary effort is normal.  Musculoskeletal:     Right wrist: Normal.     Right hand: Swelling and tenderness present. Normal pulse.     Right knee: Normal.     Left knee: Normal.     Right lower leg: Normal. No edema.     Left lower leg: Normal. No edema.     Right ankle: Normal.     Left ankle: Normal.     Comments: Mild swelling in 1st and 2nd MCP joints with pain and guarded ROM, no effusion, no erythema, no crepitus  Skin:    General: Skin is warm and dry.     Findings: No erythema or rash.  Neurological:     Mental Status: She is alert and oriented to person, place, and time.     No results found for any visits on 04/13/22.    Assessment & Plan:    Problem List Items Addressed This Visit   None Visit Diagnoses     Arthritis pain of hand    -  Primary     Arthritis pain: advised to Continue meloxicam once a day with food Ok to take with tylenol 683m every 8hrs as needed Alternate between warm and cold compress as needed Avoid all OTC NSIADs while taking meloxicam.  No follow-ups on file.     CWilfred Lacy NP

## 2022-04-16 ENCOUNTER — Encounter (HOSPITAL_BASED_OUTPATIENT_CLINIC_OR_DEPARTMENT_OTHER): Payer: Self-pay | Admitting: Cardiovascular Disease

## 2022-04-16 NOTE — Telephone Encounter (Signed)
Please advise 

## 2022-04-20 ENCOUNTER — Encounter (HOSPITAL_BASED_OUTPATIENT_CLINIC_OR_DEPARTMENT_OTHER): Payer: Self-pay | Admitting: Emergency Medicine

## 2022-04-20 ENCOUNTER — Telehealth: Payer: Self-pay | Admitting: Family

## 2022-04-20 ENCOUNTER — Emergency Department (HOSPITAL_BASED_OUTPATIENT_CLINIC_OR_DEPARTMENT_OTHER): Payer: BC Managed Care – PPO | Admitting: Radiology

## 2022-04-20 ENCOUNTER — Emergency Department (HOSPITAL_BASED_OUTPATIENT_CLINIC_OR_DEPARTMENT_OTHER): Payer: BC Managed Care – PPO

## 2022-04-20 ENCOUNTER — Emergency Department (HOSPITAL_BASED_OUTPATIENT_CLINIC_OR_DEPARTMENT_OTHER)
Admission: EM | Admit: 2022-04-20 | Discharge: 2022-04-20 | Disposition: A | Discharge status: Home / Self Care | Payer: BC Managed Care – PPO | Attending: Emergency Medicine | Admitting: Emergency Medicine

## 2022-04-20 ENCOUNTER — Encounter (HOSPITAL_BASED_OUTPATIENT_CLINIC_OR_DEPARTMENT_OTHER): Payer: Self-pay

## 2022-04-20 ENCOUNTER — Other Ambulatory Visit: Payer: Self-pay

## 2022-04-20 DIAGNOSIS — Z7982 Long term (current) use of aspirin: Secondary | ICD-10-CM | POA: Insufficient documentation

## 2022-04-20 DIAGNOSIS — R1084 Generalized abdominal pain: Secondary | ICD-10-CM | POA: Diagnosis not present

## 2022-04-20 DIAGNOSIS — M79604 Pain in right leg: Secondary | ICD-10-CM | POA: Diagnosis not present

## 2022-04-20 DIAGNOSIS — M79641 Pain in right hand: Secondary | ICD-10-CM

## 2022-04-20 LAB — COMPREHENSIVE METABOLIC PANEL
ALT: 20 U/L (ref 0–44)
AST: 20 U/L (ref 15–41)
Albumin: 4.3 g/dL (ref 3.5–5.0)
Alkaline Phosphatase: 55 U/L (ref 38–126)
Anion gap: 7 (ref 5–15)
BUN: 11 mg/dL (ref 6–20)
CO2: 28 mmol/L (ref 22–32)
Calcium: 8.6 mg/dL — ABNORMAL LOW (ref 8.9–10.3)
Chloride: 104 mmol/L (ref 98–111)
Creatinine, Ser: 0.9 mg/dL (ref 0.44–1.00)
GFR, Estimated: >60 mL/min (ref >60)
Glucose, Bld: 89 mg/dL (ref 70–99)
Potassium: 4.1 mmol/L (ref 3.5–5.1)
Sodium: 139 mmol/L (ref 135–145)
Total Bilirubin: 0.3 mg/dL (ref 0.3–1.2)
Total Protein: 7.7 g/dL (ref 6.5–8.1)

## 2022-04-20 LAB — CBC WITH DIFFERENTIAL/PLATELET
Abs Immature Granulocytes: 0 10*3/uL (ref 0.00–0.07)
Basophils Absolute: 0 10*3/uL (ref 0.0–0.1)
Basophils Relative: 0 %
Eosinophils Absolute: 0.1 10*3/uL (ref 0.0–0.5)
Eosinophils Relative: 2 %
HCT: 36.9 % (ref 36.0–46.0)
Hemoglobin: 11.9 g/dL — ABNORMAL LOW (ref 12.0–15.0)
Immature Granulocytes: 0 %
Lymphocytes Relative: 44 %
Lymphs Abs: 2.1 10*3/uL (ref 0.7–4.0)
MCH: 26.8 pg (ref 26.0–34.0)
MCHC: 32.2 g/dL (ref 30.0–36.0)
MCV: 83.1 fL (ref 80.0–100.0)
Monocytes Absolute: 0.6 10*3/uL (ref 0.1–1.0)
Monocytes Relative: 12 %
Neutro Abs: 2 10*3/uL (ref 1.7–7.7)
Neutrophils Relative %: 42 %
Platelets: 225 10*3/uL (ref 150–400)
RBC: 4.44 MIL/uL (ref 3.87–5.11)
RDW: 13.1 % (ref 11.5–15.5)
WBC: 4.7 10*3/uL (ref 4.0–10.5)
nRBC: 0 % (ref 0.0–0.2)

## 2022-04-20 LAB — URINALYSIS, ROUTINE W REFLEX MICROSCOPIC
Bilirubin Urine: NEGATIVE
Glucose, UA: NEGATIVE mg/dL
Hgb urine dipstick: NEGATIVE
Ketones, ur: NEGATIVE mg/dL
Leukocytes,Ua: NEGATIVE
Nitrite: NEGATIVE
Protein, ur: NEGATIVE mg/dL
Specific Gravity, Urine: 1.018 (ref 1.005–1.030)
pH: 6.5 (ref 5.0–8.0)

## 2022-04-20 LAB — LIPASE, BLOOD: Lipase: 71 U/L — ABNORMAL HIGH (ref 11–51)

## 2022-04-20 NOTE — Telephone Encounter (Signed)
Patient responded via [employer], advised patient in that encounter

## 2022-04-20 NOTE — ED Provider Notes (Signed)
[facility] Provider Note   CSN: [id number] Arrival date & time: 04/20/22  1129     History  Chief Complaint  Patient presents with   Leg Pain    Marilyn [name] is a 56 y.o. female.  She is here for multiple complaints.  She said her right hand has been hurting her for about 2 weeks and was swollen although the swelling is improved.  Starting yesterday she had some posterior right leg pain behind her knee and into her calf.  No known trauma.  About an hour ago she also started with some crampy diffuse abdominal pain.  She is also felt short of breath.  She called her primary care doctor and they did not have any appointments so recommended she come here to make sure she does not have a blood clot.  No fevers or chills.  No chest pain.  No vomiting or diarrhea no urinary symptoms.  The history is provided by the patient.  Leg Pain Location:  Knee and leg Time since incident:  2 days Injury: no   Leg location:  R lower leg Knee location:  R knee Pain details:    Quality:  Aching, sharp and shooting   Severity:  Moderate   Onset quality:  Gradual   Progression:  Unchanged Chronicity:  New Dislocation: no   Relieved by:  None tried Worsened by:  Activity Ineffective treatments:  None tried Associated symptoms: no decreased ROM, no fever, no numbness, no swelling and no tingling        Home Medications Prior to Admission medications   Medication Sig Start Date End Date Taking? Authorizing Provider  acetaminophen (TYLENOL) 500 MG tablet Take 500 mg by mouth as needed for moderate pain (arthritis pain).    [provider]  albuterol (VENTOLIN HFA) 108 (90 Base) MCG/ACT inhaler Inhale 2 puffs into the lungs every 4 (four) hours as needed for wheezing or shortness of breath (cough, shortness of breath or wheezing.). 07/25/21   Billie Ruddy, MD  amLODipine (NORVASC) 10 MG tablet Take 1 tablet (10 mg total) by mouth daily. 01/17/22    Billie Ruddy, MD  aspirin EC 81 MG tablet Take 1 tablet (81 mg total) by mouth daily. Swallow whole. 03/30/20   Werner Lean, MD  atorvastatin (LIPITOR) 20 MG tablet TAKE ONE TABLET BY MOUTH DAILY 01/05/22   Skeet Latch, MD  Beclomethasone Dipropionate 40 MCG/ACT AERS Place 1 spray into the nose in the morning and at bedtime. 03/23/21   Billie Ruddy, MD  Blood Glucose Monitoring Suppl (ONETOUCH VERIO FLEX SYSTEM) w/Device KIT Use to check blood glucose TID or prn 07/10/21   Billie Ruddy, MD  Blood Pressure Monitoring (COMFORT TOUCH BP CUFF/LARGE) MISC Please measure blood pressure 1 time per day 1-2 hours after taking medication and resting for 5-10 minutes, please keep a log and bring to all future appointments. DX I10 12/18/21   Skeet Latch, MD  Cholecalciferol (VITAMIN D) 125 MCG (5000 UT) CAPS Take 1 tablet by mouth daily.    [provider]  cyclobenzaprine (FLEXERIL) 5 MG tablet Take 1 tablet (5 mg total) by mouth 3 (three) times daily as needed. 12/27/17   Jaynee Eagles, PA-C  EPIPEN 2-PAK 0.3 MG/0.3ML SOAJ injection Inject 0.3 mg into the muscle as needed for anaphylaxis. 07/25/21   Mosie Lukes, MD  glucose blood (ONETOUCH VERIO) test strip USE ONE STRIP TO TEST THREE TIMES A DAY 10/24/21  Billie Ruddy, MD  Lancets [facility] DELICA PLUS [id number]) MISC USE 1 LANCET TO TEST BLOOD SUGAR ONCE DAILY 10/24/21   Billie Ruddy, MD  levothyroxine (SYNTHROID) 100 MCG tablet Take 1 tablet (100 mcg total) by mouth daily. 01/17/22   Billie Ruddy, MD  metoprolol succinate (TOPROL XL) 25 MG 24 hr tablet Take 1 tablet (25 mg total) by mouth daily. 11/17/21   Loel Dubonnet, NP  omeprazole (PRILOSEC) 40 MG capsule Take 1 capsule (40 mg total) by mouth 2 (two) times daily. 07/12/21 02/07/22  Ladene Artist, MD  ondansetron (ZOFRAN-ODT) 4 MG disintegrating tablet Take 1 tablet (4 mg total) by mouth every 8 (eight) hours as needed for nausea or vomiting. Patient  not taking: Reported on 04/13/2022 07/30/19   Davonna Belling, MD  vitamin B-12 (CYANOCOBALAMIN) 500 MCG tablet Take 500 mcg by mouth daily.    [provider]      Allergies    Promethazine-dm, Blain Pais allergy], Codeine, Fluticasone propionate, Promethazine, and Flonase [fluticasone propionate]    Review of Systems   Review of Systems  Constitutional:  Negative for fever.  Respiratory:  Positive for shortness of breath.   Cardiovascular:  Negative for chest pain.  Gastrointestinal:  Positive for abdominal pain. Negative for diarrhea, nausea and vomiting.  Genitourinary:  Negative for dysuria.  Skin:  Negative for rash and wound.    Physical Exam Updated Vital Signs BP (!) 151/93 (BP Location: Left Arm)   Pulse 90   Temp 98.2 F (36.8 C) (Oral)   Resp 20   LMP 02/21/2013   SpO2 98%  Physical Exam Vitals and nursing note reviewed.  Constitutional:      General: She is not in acute distress.    Appearance: Normal appearance. She is well-developed.  HENT:     Head: Normocephalic and atraumatic.  Eyes:     Conjunctiva/sclera: Conjunctivae normal.  Cardiovascular:     Rate and Rhythm: Normal rate and regular rhythm.     Pulses: Normal pulses.     Heart sounds: No murmur heard. Pulmonary:     Effort: Pulmonary effort is normal. No respiratory distress.     Breath sounds: Normal breath sounds.  Abdominal:     Palpations: Abdomen is soft.     Tenderness: There is no abdominal tenderness. There is no guarding or rebound.  Musculoskeletal:        General: Tenderness present. No deformity. Normal range of motion.     Cervical back: Neck supple.     Comments: She has some mild tenderness over the metacarpals of her right hand.  Full range of motion.  Wrist nontender.  Distal pulses motor and sensation intact.  Right lower extremity full range of motion without any limitations.  She has some tenderness behind her right knee and into her right calf.  There is no  swelling or warmth.  Distal pulses are and sensation intact.  No cords appreciated.  Skin:    General: Skin is warm and dry.     Capillary Refill: Capillary refill takes less than 2 seconds.  Neurological:     General: No focal deficit present.     Mental Status: She is alert.     Sensory: No sensory deficit.     Motor: No weakness.     Gait: Gait normal.     ED Results / Procedures / Treatments   Labs (all labs ordered are listed, but only abnormal results are displayed) Labs Reviewed  COMPREHENSIVE METABOLIC PANEL - Abnormal; Notable for the following components:      Result Value   Calcium 8.6 (*)    All other components within normal limits  LIPASE, BLOOD - Abnormal; Notable for the following components:   Lipase 71 (*)    All other components within normal limits  CBC WITH DIFFERENTIAL/PLATELET - Abnormal; Notable for the following components:   Hemoglobin 11.9 (*)    All other components within normal limits  URINALYSIS, ROUTINE W REFLEX MICROSCOPIC    EKG EKG Interpretation  Date/Time:  Friday April 20 2022 12:27:46 EST Ventricular Rate:  79 PR Interval:  206 QRS Duration: 76 QT Interval:  380 QTC Calculation: 436 R Axis:   54 Text Interpretation: Sinus rhythm Borderline prolonged PR interval Low voltage, precordial leads No significant change since prior 8/23 Confirmed by Aletta Edouard [phone]) on 04/20/2022 12:28:59 PM  Radiology US Venous Img Lower Right (DVT Study)  Result Date: 04/20/2022 CLINICAL DATA:  56 year old female shortness of breath and leg pain. EXAM: RIGHT LOWER EXTREMITY VENOUS DOPPLER ULTRASOUND TECHNIQUE: Gray-scale sonography with graded compression, as well as color Doppler and duplex ultrasound were performed to evaluate the right lower extremity deep venous systems from the level of the common femoral vein and including the common femoral, femoral, profunda femoral, popliteal and calf veins including the posterior tibial, peroneal and  gastrocnemius veins when visible. Spectral Doppler was utilized to evaluate flow at rest and with distal augmentation maneuvers in the common femoral, femoral and popliteal veins. The contralateral common femoral vein was also evaluated for comparison. COMPARISON:  None Available. FINDINGS: RIGHT LOWER EXTREMITY Common Femoral Vein: No evidence of thrombus. Normal compressibility, respiratory phasicity and response to augmentation. Central Greater Saphenous Vein: No evidence of thrombus. Normal compressibility and flow on color Doppler imaging. Central Profunda Femoral Vein: No evidence of thrombus. Normal compressibility and flow on color Doppler imaging. Femoral Vein: No evidence of thrombus. Normal compressibility, respiratory phasicity and response to augmentation. Popliteal Vein: No evidence of thrombus. Normal compressibility, respiratory phasicity and response to augmentation. Calf Veins: No evidence of thrombus. Normal compressibility and flow on color Doppler imaging. Other Findings:  None. LEFT LOWER EXTREMITY Common Femoral Vein: No evidence of thrombus. Normal compressibility, respiratory phasicity and response to augmentation. IMPRESSION: No evidence of right lower extremity deep venous thrombosis. Ruthann Cancer, MD [facility] Radiology Electronically Signed   By: Ruthann Cancer M.D.   On: 04/20/2022 13:10   DG Hand Complete Right  Result Date: 04/20/2022 CLINICAL DATA:  RIGHT hand pain for 2 weeks EXAM: RIGHT HAND - COMPLETE 3+ VIEW COMPARISON:  None available FINDINGS: Mild radiocarpal joint space narrowing. Remaining joint spaces preserved. Osseous mineralization normal. No acute fracture, dislocation, or bone destruction. IMPRESSION: Mild degenerative changes radiocarpal joint. Electronically Signed   By: Lavonia Dana M.D.   On: 04/20/2022 12:42   DG Chest 2 View  Result Date: 04/20/2022 CLINICAL DATA: Shortness of breath EXAM: CHEST - 2 VIEW  COMPARISON:  Radiograph 07/05/2021 FINDINGS: Unchanged cardiomediastinal silhouette. There is no focal airspace consolidation. There is no pleural effusion or pneumothorax. There is no acute osseous abnormality. Thoracic spondylosis. IMPRESSION: No evidence of acute cardiopulmonary disease. Electronically Signed   By: Maurine Simmering M.D.   On: 04/20/2022 12:30    Procedures Procedures    Medications Ordered in ED Medications - No data to display  ED Course/ Medical Decision Making/ A&P Clinical Course as of 04/20/22 1715  Fri Apr 20, 2022  1229 Chest x-ray and right hand x-ray do not show any acute Findings.  Awaiting radiology reading. [MB]  1327 Patient's work-up has been fairly unremarkable.  Lipase is nonspecifically elevated at 71.  Reviewed everything with patient.  She is going to symptomatically treat with Tylenol and follow-up with her PCP.  Return instructions discussed [MB]    Clinical Course User Index [MB] Hayden Rasmussen, MD                           Medical Decision Making Amount and/or Complexity of Data Reviewed Labs: ordered. Radiology: ordered.   This patient complains of right hand pain right leg pain shortness of breath and chest pain; this involves an extensive number of treatment Options and is a complaint that carries with it a high risk of complications and morbidity. The differential includes musculoskeletal pain, fracture, dislocation, DVT, CHF, pneumonia  I ordered, reviewed and interpreted labs, which included CBC with normal white count stable hemoglobin, chemistries and LFTs normal, urinalysis without signs of infection I ordered imaging studies which included chest x-ray right hand x-ray and DVT study and I independently    visualized and interpreted imaging which showed no acute findings Previous records obtained and reviewed in epic including recent PCP visit  Cardiac monitoring reviewed, normal sinus rhythm Social determinants considered, no  significant barriers Critical Interventions: None  After the interventions stated above, I reevaluated the patient and found patient to be well-appearing in no distress Admission and further testing considered, no indications for admission or further work-up at this time.  Recommended close follow-up with PCP.  Return instructions discussed         Final Clinical Impression(s) / ED Diagnoses Final diagnoses:  Right hand pain  Right leg pain  Generalized abdominal pain    Rx / DC Orders ED Discharge Orders     None         Hayden Rasmussen, MD 04/20/22 [id number]

## 2022-04-20 NOTE — ED Triage Notes (Signed)
Pt c/o right posterior leg pain since yesterday, and right hand pain for 2 weeks.

## 2022-04-20 NOTE — Telephone Encounter (Signed)
Returned call to patient, no answer, will send follow up mychart message and leave a VM

## 2022-04-20 NOTE — Discharge Instructions (Signed)
You were seen in the emergency department for right hand pain right leg pain and generalized abdominal pain.  You had an ultrasound that showed no evidence of a blood clot in your leg, and an x-ray of your right hand that showed some arthritis changes.  Your lab work was unremarkable.  Please follow-up with your primary care doctor.  Return to the emergency department if any worsening or concerning symptoms.

## 2022-04-20 NOTE — Telephone Encounter (Signed)
Patient calling in regards to mycahrt message. She requests a call back.

## 2022-04-20 NOTE — ED Notes (Signed)
Discharge paperwork given and verbally understood. 

## 2022-04-20 NOTE — ED Notes (Signed)
Current pain in the right hand and right knee while straight. CMS intact for both extremities. No current left hand numbness, occasional.

## 2022-04-23 ENCOUNTER — Telehealth: Payer: Self-pay

## 2022-04-23 NOTE — Telephone Encounter (Signed)
Transition Care Management Unsuccessful Follow-up Telephone Call  Date of discharge and from where:  04/20/22 from [facility] for Pain in right hand  Attempts:  1st Attempt  Reason for unsuccessful TCM follow-up call:  Left voice message

## 2022-05-12 ENCOUNTER — Other Ambulatory Visit: Payer: Self-pay | Admitting: Family Medicine

## 2022-05-12 ENCOUNTER — Other Ambulatory Visit (HOSPITAL_BASED_OUTPATIENT_CLINIC_OR_DEPARTMENT_OTHER): Payer: Self-pay | Admitting: Family

## 2022-05-12 DIAGNOSIS — E119 Type 2 diabetes mellitus without complications: Secondary | ICD-10-CM

## 2022-05-12 DIAGNOSIS — I493 Ventricular premature depolarization: Secondary | ICD-10-CM

## 2022-05-12 DIAGNOSIS — R002 Palpitations: Secondary | ICD-10-CM

## 2022-05-14 ENCOUNTER — Encounter (HOSPITAL_BASED_OUTPATIENT_CLINIC_OR_DEPARTMENT_OTHER): Payer: Self-pay | Admitting: Cardiovascular Disease

## 2022-05-14 ENCOUNTER — Ambulatory Visit (INDEPENDENT_AMBULATORY_CARE_PROVIDER_SITE_OTHER): Payer: BC Managed Care – PPO | Admitting: Cardiovascular Disease

## 2022-05-14 VITALS — BP 126/74 | HR 91 | Ht 63.0 in | Wt 201.0 lb

## 2022-05-14 DIAGNOSIS — R1310 Dysphagia, unspecified: Secondary | ICD-10-CM | POA: Diagnosis not present

## 2022-05-14 DIAGNOSIS — I3139 Other pericardial effusion (noninflammatory): Secondary | ICD-10-CM

## 2022-05-14 DIAGNOSIS — I10 Essential (primary) hypertension: Secondary | ICD-10-CM

## 2022-05-14 DIAGNOSIS — R700 Elevated erythrocyte sedimentation rate: Secondary | ICD-10-CM

## 2022-05-14 DIAGNOSIS — I700 Atherosclerosis of aorta: Secondary | ICD-10-CM

## 2022-05-14 NOTE — Patient Instructions (Addendum)
Medication Instructions:  Your physician recommends that you continue on your current medications as directed. Please refer to the Current Medication list given to you today.   *If you need a refill on your cardiac medications before your next appointment, please call your pharmacy*   Lab Work: FASTING LP/CMET SOON   If you have labs (blood work) drawn today and your tests are completely normal, you will receive your results only by: [facility] (if you have MyChart) OR A paper copy in the mail If you have any lab test that is abnormal or we need to change your treatment, we will call you to review the results.   Testing/Procedures:  Call [facility] [phone] to inquire about your cpap set up.   Your physician has requested that you have an echocardiogram. Echocardiography is a painless test that uses sound waves to create images of your heart. It provides your doctor with information about the size and shape of your heart and how well your heart's chambers and valves are working. This procedure takes approximately one hour. There are no restrictions for this procedure. Please do NOT wear cologne, perfume, aftershave, or lotions (deodorant is allowed). Please arrive 15 minutes prior to your appointment time.  Follow-Up: At [facility], you and your health needs are our priority.  As part of our continuing mission to provide you with exceptional heart care, we have created designated Provider Care Teams.  These Care Teams include your primary Cardiologist (physician) and Advanced Practice Providers (APPs -  Physician Assistants and Nurse Practitioners) who all work together to provide you with the care you need, when you need it.  We recommend signing up for the patient portal called "MyChart".  Sign up information is provided on this After Visit Summary.  MyChart is used to connect with patients for Virtual Visits (Telemedicine).  Patients are able to view lab/test  results, encounter notes, upcoming appointments, etc.  Non-urgent messages can be sent to your provider as well.   To learn more about what you can do with MyChart, go to [email or web address].    Your next appointment:   6 month(s)  The format for your next appointment:   In Person  Provider:   Skeet Latch, MD or Laurann Montana, NP    FOLLOW UP WITH YOUR GASTROENTEROLOGIST   You have been referred to RHEUMATOLOGY IF YOU DO NOT HEAR FROM THEM IN 2 WEEKS CALL THE OFFICE TO FOLLOW UP

## 2022-05-14 NOTE — Progress Notes (Signed)
Cardiology Office Note:    Date:  06/10/2022   ID:  [name], DOB 11-30-1965, MRN [id number]  PCP:  Billie Ruddy, MD  Cardiologist:  Skeet Latch, MD    Referring MD: Billie Ruddy, MD   No chief complaint on file.   History of Present Illness:    Marilyn [name] is a 56 y.o. female with a hx of hypertension, hyperlipidemia, and obesity here today for follow-up. She was initially seen 07/26/2021 for the evaluation of pericardial effusion at the request of Dr. Volanda Napoleon.   She presented to the ED 07/2021 for precordial pain. She was at work when she developed aching L arm pain and swelling. Upon arrival, she also reported R leg pain, R lower quadrant abdominal pain, and generalized chest discomfort. She was diagnosed with COVID over a month prior. She was recommended to be evaluated for possible blood clot due to persistent productive cough after the diagnosis. The pain was managed in the ED and she was encouraged to follow up with her PCP and pertinent specialists upon discharge.   She followed up with Dr. Virl Cagey with concern for deep vein thrombosis. The swelling had decreased significantly. Upper Venous DVT study 07/2021 showed no evidence of DVT or thrombosis.   She followed up with Dr. Volanda Napoleon and stated the L arm edema was improving. Ever since, she noticed discoloration and mild tingling in her L hand. She reported increased burning in her central chest and tightness in her upper throat. She started taking herbal heartburn medication. She endorsed random intermittent chest pain and R lateral face and hand edema. Chest CTA 07/2021 revealed trace pericardial effusion and aortic arthrosclerosis. She was referred to cardiology.  She reported exertional dyspnea. On her chest CT there was no evidence of coronary calcification and she was negative for PE. She had an Echo  08/2021 that revealed LVEF 60-65% with mild LVH and grade 1 diastolic dysfunction. She had a small  pericardial effusion but no tamponade. She followed up with Laurann Montana, NP and reported palpitations. She wore a monitor that showed rare PACs and PVCs. She was referred for a PYP scan 03/2022 that were equivocal for amyloidosis. She was started on Atorvastatin due to coronary calcification.  Today, she notes that she has been doing well overall. However, she does have occasional episodes of palpitations if she forgets her medications. She continues to have right leg pain and BLE swelling. She is wearing compression stockings from time to time with relief. She has been using pink salt and a vegetable blend to season her foods. She notes that her blood pressure at home has been running around 117/70s. The highest BP she has seen at home has been 135/80.  The patient denies chest pain, chest pressure, dyspnea at rest or with exertion, PND, and orthopnea. Denies cough, fever, chills, nausea, or vomiting. Denies syncope, presyncope, or snoring. Denies dizziness or lightheadedness.    She notes occasional episodes of difficulty swallowing causing her to chop up her food smaller. She does not see a GI provider. She is working on establishing new endocrinology care for management of her hypothyroidism and would like a referral for this today. She has not yet received the rheumatology referral that we discussed at our last visit. She states that her son has concerns about her memory at times and she has occasional tremors. She does not feel that she is particularly stressed or anxious. She states that she does have an upcoming annual visit with  Dr. Volanda Napoleon and plans to address these concerns with her as well.  She reports that she still has not received her CPAP. She had her sleep study in September 2023. She notes that she has called multiple times about her machine.   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   Neck pain 09/09/2011   Obesity 04/11/2014   Pericardial effusion 02/07/2022    Past Surgical History:  Procedure Laterality Date   BREAST BIOPSY Right 12/2014   BREAST EXCISIONAL BIOPSY Right 2016   BREAST LUMPECTOMY WITH RADIOACTIVE SEED LOCALIZATION Right 02/25/2015   Procedure: BREAST LUMPECTOMY WITH RADIOACTIVE SEED LOCALIZATION;  Surgeon: Alphonsa Overall, MD;  Location: [facility];  Service: General;  Laterality: Right;   BREAST REDUCTION SURGERY  1986   BREAST SURGERY     broken jaw     CARPAL TUNNEL RELEASE  2001   FRACTURE SURGERY     REDUCTION MAMMAPLASTY Bilateral    THYROIDECTOMY     TUBAL LIGATION  1987    Current Medications: Current Meds  Medication Sig   albuterol (VENTOLIN HFA) 108 (90 Base) MCG/ACT inhaler Inhale 2 puffs into the lungs every 4 (four) hours as needed for wheezing or shortness of breath (cough, shortness of breath or wheezing.).   amLODipine (NORVASC) 10 MG tablet Take 1 tablet (10 mg total) by mouth daily.   aspirin EC 81 MG tablet Take 1 tablet (81 mg total) by mouth daily. Swallow whole.   Beclomethasone Dipropionate 40 MCG/ACT AERS Place 1 spray into the nose in the morning and at bedtime.   Blood Glucose Monitoring Suppl ([place]) w/Device KIT Use to check blood glucose TID or prn   Blood Pressure Monitoring (COMFORT TOUCH BP CUFF/LARGE) MISC Please measure blood pressure 1 time per day 1-2 hours after taking medication and resting for 5-10 minutes, please keep a log and bring to all future appointments. DX I10   Cholecalciferol (VITAMIN D) 125 MCG (5000 UT) CAPS Take 1  tablet by mouth daily.   cyclobenzaprine (FLEXERIL) 5 MG tablet Take 1 tablet (5 mg total) by mouth 3 (three) times daily as needed.   EPIPEN 2-PAK 0.3 MG/0.3ML SOAJ injection Inject 0.3 mg into the muscle as needed for anaphylaxis.   levothyroxine (SYNTHROID) 100 MCG tablet Take 1 tablet (100 mcg total) by mouth daily.   metoprolol succinate (TOPROL-XL) 25 MG 24 hr tablet TAKE 1 TABLET BY MOUTH DAILY   omeprazole (PRILOSEC) 40 MG capsule Take 1 capsule (40 mg total) by mouth 2 (two) times daily.   ondansetron (ZOFRAN-ODT) 4 MG disintegrating tablet Take 1 tablet (4 mg total) by mouth every 8 (eight) hours as needed for nausea or vomiting.   vitamin B-12 (CYANOCOBALAMIN) 500 MCG tablet Take 500 mcg by mouth daily.   [DISCONTINUED] acetaminophen (TYLENOL) 500 MG tablet Take 500 mg by mouth as needed for moderate pain (arthritis pain).   [DISCONTINUED] atorvastatin (LIPITOR) 20 MG tablet TAKE ONE TABLET BY  MOUTH DAILY   [DISCONTINUED] glucose blood (ONETOUCH VERIO) test strip USE ONE STRIP TO TEST THREE TIMES A DAY   [DISCONTINUED] Lancets (ONETOUCH DELICA PLUS [id number]) MISC USE 1 LANCET TO TEST BLOOD SUGAR ONCE DAILY     Allergies:   Promethazine-dm, Tuna [fish allergy], Codeine, Fluticasone propionate, Promethazine, and Flonase [fluticasone propionate]   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[employer]    Employer: [employer]  Tobacco Use   Smoking status: Former    Packs/day: 0.25    Years: 3.00    Total pack years: 0.75    Types: Cigarettes    Quit date: 06/04/1980    Years since quitting: 42.0   Smokeless tobacco: Never  Vaping Use   Vaping Use: Never used  Substance and Sexual Activity   Alcohol use: No    Alcohol/week: 0.0 standard drinks of alcohol   Drug use: No   Sexual  activity: Not Currently    Comment: 1st intercourse 12 yo-5 partners  Other Topics Concern   Not on file  Social History Narrative   Married   Social Determinants of Health   Financial Resource Strain: Low Risk  (07/26/2021)   Overall Financial Resource Strain (CARDIA)    Difficulty of Paying Living Expenses: Not hard at all  Food Insecurity: No Food Insecurity (07/26/2021)   Hunger Vital Sign    Worried About Running Out of Food in the Last Year: Never true    Ran Out of Food in the Last Year: Never true  Transportation Needs: No Transportation Needs (07/26/2021)   PRAPARE - Transportation    Lack of Transportation (Medical): No    Lack of Transportation (Non-Medical): No  Physical Activity: Insufficiently Active (07/26/2021)   Exercise Vital Sign    Days of Exercise per Week: 2 days    Minutes of Exercise per Session: 40 min  Stress: Not on file  Social Connections: Not on file     Family History: The patient's family history includes Asthma in her maternal grandfather and mother; Breast cancer (age of onset: 85) in her sister; Diabetes in her father and maternal grandmother; Heart attack in her maternal grandfather; Heart attack (age of onset: 81) in her maternal grandmother; Heart disease in her maternal grandmother; Hyperlipidemia in her father; Hypertension in her father; Stroke in her maternal grandfather and maternal grandmother; Thyroid disease in her sister. There is no history of Colon cancer.  ROS:   Please see the history of present illness.    + Palpitations + RLE pain + BLE swelling + Difficulty swallowing + Memory difficulty + Tremors All other systems reviewed and negative.   EKGs/Labs/Other Studies Reviewed:    The following studies were reviewed today:  Myocardial Amyloid Imaging 03/05/22:   By semi-quantitative assessment scan is consistent with increased heart uptake but less than rib uptake-Grade 1. Heart to contralateral lung ratio is between 1-1.5,  indeterminate for amyloid.   Study is equivocal for TTR amyloidosis (visual score of 1/ratio between 1-1.5).   Prior study not available for comparison. Conclusion: Equivocal for TTR amyloidosis (Equivocal: A semi-quantitative visual score of 1 or H/CL ratio 1-1.5)  Monitor  12/2021: 14 Day Event Monitor Quality: Fair.  Baseline artifact. Predominant rhythm: Sinus rhythm Average heart rate: 89  bpm Max heart rate: 140 bpm Min heart rate: 56 bpm Pauses >2.5 seconds: None Rare PACs.  1.2% PVCs occasional ventricular couplets, ventricular bigeminy and ventricular trigeminy  Echo  08/03/2021: Sonographer Comments: Technically difficult study due to poor echo windows and patient is morbidly obese. Image acquisition challenging due to patient body habitus and Image acquisition challenging due to respiratory motion.   1. Left ventricular ejection fraction, by estimation, is 60 to 65%. The  left ventricle has normal function. The left ventricle has no regional  wall motion abnormalities. There is mild concentric left ventricular  hypertrophy. Left ventricular diastolic  parameters are consistent with Grade I diastolic dysfunction (impaired  relaxation).   2. Right ventricular systolic function is normal. The right ventricular  size is normal.   3. Left atrial size was mildly dilated.   4. A small pericardial effusion is present. The pericardial effusion is  circumferential.   5. The mitral valve is normal in structure. Mild mitral valve  regurgitation. No evidence of mitral stenosis.   6. The aortic valve is tricuspid. There is mild calcification of the  aortic valve. There is mild thickening of the aortic valve. Aortic valve  regurgitation is trivial. Aortic valve sclerosis/calcification is present,  without any evidence of aortic  stenosis.   7. The inferior vena cava is normal in size with greater than 50%  respiratory variability, suggesting right atrial pressure of 3 mmHg.   Upper  Venous Study 07/07/21 Right:  No evidence of thrombosis in the subclavian.  Left:  No evidence of deep vein thrombosis in the upper extremity. No evidence of superficial vein thrombosis in the upper extremity.   CTA Chest 07/05/21 IMPRESSION: 1. No evidence of pulmonary embolus. 2. Trace pericardial effusion. 3. Minimal sigmoid diverticulosis without diverticulitis. 4. Fibroid uterus. 5.  Aortic Atherosclerosis (ICD10-I70.0).  EKG:  EKG was not ordered today, 05/14/22. 02/07/2022:  EKG was not ordered. 11/17/2021 Laurann Montana, NP):  ST 108 bpm with PV's. No acute ST/T wave changes.   Recent Labs: 11/17/2021: Magnesium 2.4 01/12/2022: TSH 0.80 01/31/2022: B Natriuretic Peptide 4.7 05/17/2022: ALT 24 06/07/2022: BUN 13; Creatinine, Ser 0.85; Hemoglobin 12.6; Platelets 230; Potassium 4.2; Sodium 140   Recent Lipid Panel    Component Value Date/Time   CHOL 164 05/17/2022 0756   TRIG 95 05/17/2022 0756   HDL 47 05/17/2022 0756   CHOLHDL 3.5 05/17/2022 0756   CHOLHDL 4 07/17/2019 0732   VLDL 17.8 07/17/2019 0732   LDLCALC 99 05/17/2022 0756   LDLDIRECT 165.3 12/15/2012 1153    Physical Exam:    VS:  BP 126/74 (BP Location: Right Arm, Patient Position: Sitting)   Pulse 91   Ht _0  (1.6 m)   Wt 201 lb (91.2 kg)   LMP 02/21/2013   SpO2 98%   BMI 35.61 kg/m  , BMI Body mass index is 35.61 kg/m. GENERAL:  Well appearing HEENT: Pupils equal round and reactive, fundi not visualized, oral mucosa unremarkable NECK:  No jugular venous distention, waveform within normal limits, carotid upstroke brisk and symmetric, no bruits, no thyromegaly LUNGS:  Clear to auscultation bilaterally HEART:  RRR.  PMI not displaced or sustained,S1 and S2 within normal limits, no S3, no S4, no clicks, no rubs, no murmurs ABD:  Flat, positive bowel sounds normal in frequency in pitch, no bruits, no rebound, no guarding, no midline pulsatile mass, no hepatomegaly, no splenomegaly EXT:  2 plus pulses  throughout, no edema, no cyanosis no clubbing SKIN:  No  rashes no nodules NEURO:  Cranial nerves II through XII grossly intact, motor grossly intact throughout PSYCH:  Cognitively intact, oriented to person place and time   ASSESSMENT:    1. Essential hypertension   2. Pericardial effusion   3. Aortic atherosclerosis ([place])   4. Dysphagia, unspecified type   5. Elevated sed rate      PLAN:    Essential hypertension Blood pressure is initially mildly elevated but better on repeat.  Continue amlodipine and metoprolol.  Pericardial effusion Echo 08/2021 showed a small pericardial effusion.  Her autoimmune labs were also elevated.  She was previously referred to rheumatology and has not yet seen them.  We will refer her again.  We will also repeat her echocardiogram.  She is not having any heart failure or tamponade symptoms.  PYP scan was equivocal for amyloidosis.  Aortic atherosclerosis ([place]) She was started on atorvastatin.  She has not yet had fasting lipids and a CMP.  She will come back for this.  LDL goal is less than 70.  Dysphagia She is noted symptoms of dysphagia and sometimes has a hard time swallowing her pills.  She has a gastroenterologist and will schedule follow-up with them.    Disposition: f/u in 6 months  Medication Adjustments/Labs and Tests Ordered: Current medicines are reviewed at length with the patient today.  Concerns regarding medicines are outlined above.   Orders Placed This Encounter  Procedures   Comprehensive metabolic panel   Lipid panel   Ambulatory referral to Rheumatology   ECHOCARDIOGRAM COMPLETE   No orders of the defined types were placed in this encounter.   I,Alexis Herring,acting as a Education administrator for Skeet Latch, MD.,have documented all relevant documentation on the behalf of Skeet Latch, MD,as directed by  Skeet Latch, MD while in the presence of Skeet Latch, MD.  I, Hastings Oval Linsey, MD have reviewed all  documentation for this visit.  The documentation of the exam, diagnosis, procedures, and orders on 06/10/2022 are all accurate and complete.   Signed, Skeet Latch, MD  06/10/2022 7:18 AM    [place]

## 2022-05-14 NOTE — Assessment & Plan Note (Signed)
She was started on atorvastatin.  She has not yet had fasting lipids and a CMP.  She will come back for this.  LDL goal is less than 70.

## 2022-05-14 NOTE — Assessment & Plan Note (Signed)
Echo 08/2021 showed a small pericardial effusion.  Her autoimmune labs were also elevated.  She was previously referred to rheumatology and has not yet seen them.  We will refer her again.  We will also repeat her echocardiogram.  She is not having any heart failure or tamponade symptoms.  PYP scan was equivocal for amyloidosis.

## 2022-05-14 NOTE — Assessment & Plan Note (Signed)
She is noted symptoms of dysphagia and sometimes has a hard time swallowing her pills.  She has a gastroenterologist and will schedule follow-up with them.

## 2022-05-14 NOTE — Telephone Encounter (Signed)
Rx(s) sent to pharmacy electronically.  

## 2022-05-14 NOTE — Assessment & Plan Note (Signed)
Blood pressure is initially mildly elevated but better on repeat.  Continue amlodipine and metoprolol.

## 2022-05-17 LAB — COMPREHENSIVE METABOLIC PANEL
ALT: 24 IU/L (ref 0–32)
AST: 25 IU/L (ref 0–40)
Albumin/Globulin Ratio: 1.6 (ref 1.2–2.2)
Albumin: 4.7 g/dL (ref 3.8–4.9)
Alkaline Phosphatase: 97 IU/L (ref 44–121)
BUN/Creatinine Ratio: 13 (ref 9–23)
BUN: 14 mg/dL (ref 6–24)
Bilirubin Total: <0.2 mg/dL (ref 0.0–1.2)
CO2: 23 mmol/L (ref 20–29)
Calcium: 8.5 mg/dL — ABNORMAL LOW (ref 8.7–10.2)
Chloride: 102 mmol/L (ref 96–106)
Creatinine, Ser: 1.07 mg/dL — ABNORMAL HIGH (ref 0.57–1.00)
Globulin, Total: 3 g/dL (ref 1.5–4.5)
Glucose: 105 mg/dL — ABNORMAL HIGH (ref 70–99)
Potassium: 4.4 mmol/L (ref 3.5–5.2)
Sodium: 140 mmol/L (ref 134–144)
Total Protein: 7.7 g/dL (ref 6.0–8.5)
eGFR: 60 mL/min/{1.73_m2} (ref >59)

## 2022-05-17 LAB — LIPID PANEL
Chol/HDL Ratio: 3.5 ratio (ref 0.0–4.4)
Cholesterol, Total: 164 mg/dL (ref 100–199)
HDL: 47 mg/dL (ref >39)
LDL Chol Calc (NIH): 99 mg/dL (ref 0–99)
Triglycerides: 95 mg/dL (ref 0–149)
VLDL Cholesterol Cal: 18 mg/dL (ref 5–40)

## 2022-06-05 ENCOUNTER — Telehealth: Payer: Self-pay

## 2022-06-05 ENCOUNTER — Telehealth: Payer: Self-pay | Admitting: Cardiovascular Disease

## 2022-06-05 DIAGNOSIS — E785 Hyperlipidemia, unspecified: Secondary | ICD-10-CM

## 2022-06-05 DIAGNOSIS — I10 Essential (primary) hypertension: Secondary | ICD-10-CM

## 2022-06-05 DIAGNOSIS — Z5181 Encounter for therapeutic drug level monitoring: Secondary | ICD-10-CM

## 2022-06-05 MED ORDER — ATORVASTATIN CALCIUM 40 MG PO TABS: 40.0000 mg | ORAL_TABLET | Freq: Every day | ORAL | 3 refills | Status: DC

## 2022-06-05 NOTE — Telephone Encounter (Signed)
Called and lvm for pt to call back to schedule NP appt. Provider blocked out 08/03/2022 at 1:40 if pt is available to come in.

## 2022-06-05 NOTE — Telephone Encounter (Signed)
-----   Message from Loel Dubonnet, NP sent at 06/02/2022  1:57 PM EST ----- Creatinine slightly elevated, recommend increasing oral hydration. Normal liver. LDL (bad cholesterol) not at goal of <70. Increase atorvastatin to '40mg'$  daily with repeat FLP/LFT in 3 months.

## 2022-06-05 NOTE — Telephone Encounter (Signed)
Patient has now been scheduled

## 2022-06-05 NOTE — Telephone Encounter (Signed)
Discussed results with patient  She actually has been taking Atorvastatin 20 mg twice a day since October, maybe November Discussed with Overton Mam NP who recommended 2 options, 1 add Zetia 10 mg daily or continue Atorvastatin 40 mg for another 3 months and recheck labs Patient would like to try for another 3 months  New Rx for Atorvastatin 40  mg sent to pharmacy  Mailed lab orders  Patient also stated she still had not received return call from [facility] regarding CPAP machine   [phone] [place]  [place] out to both offices and neither can find patient in system   Will reach out to sleep pool and ask them to follow up on orders

## 2022-06-05 NOTE — Telephone Encounter (Signed)
Pt is returning call in regards to results. Requesting call back.  

## 2022-06-06 ENCOUNTER — Ambulatory Visit (INDEPENDENT_AMBULATORY_CARE_PROVIDER_SITE_OTHER): Payer: BC Managed Care – PPO

## 2022-06-06 ENCOUNTER — Encounter (HOSPITAL_BASED_OUTPATIENT_CLINIC_OR_DEPARTMENT_OTHER): Payer: Self-pay | Admitting: Cardiovascular Disease

## 2022-06-06 DIAGNOSIS — I3139 Other pericardial effusion (noninflammatory): Secondary | ICD-10-CM | POA: Diagnosis not present

## 2022-06-06 LAB — ECHOCARDIOGRAM COMPLETE
Area-P 1/2: 4.06 cm2
S' Lateral: 1.74 cm

## 2022-06-06 NOTE — Telephone Encounter (Signed)
Spoke with patient 1/2 has appointment with Rheumatology in March  Saw Dr Oval Linsey 12/11

## 2022-06-06 NOTE — Telephone Encounter (Addendum)
Spoke with patient 1/2 has appointment with Rheumatology in March  Saw Dr Oval Linsey 12/11

## 2022-06-07 ENCOUNTER — Emergency Department (HOSPITAL_BASED_OUTPATIENT_CLINIC_OR_DEPARTMENT_OTHER): Payer: BC Managed Care – PPO

## 2022-06-07 ENCOUNTER — Other Ambulatory Visit: Payer: Self-pay | Admitting: Gastroenterology

## 2022-06-07 ENCOUNTER — Telehealth: Payer: Self-pay | Admitting: Cardiovascular Disease

## 2022-06-07 ENCOUNTER — Emergency Department (HOSPITAL_BASED_OUTPATIENT_CLINIC_OR_DEPARTMENT_OTHER)
Admission: EM | Admit: 2022-06-07 | Discharge: 2022-06-07 | Disposition: A | Discharge status: Home / Self Care | Payer: BC Managed Care – PPO | Attending: Emergency Medicine | Admitting: Emergency Medicine

## 2022-06-07 ENCOUNTER — Encounter (HOSPITAL_BASED_OUTPATIENT_CLINIC_OR_DEPARTMENT_OTHER): Payer: Self-pay | Admitting: Emergency Medicine

## 2022-06-07 ENCOUNTER — Other Ambulatory Visit: Payer: Self-pay

## 2022-06-07 DIAGNOSIS — R1030 Lower abdominal pain, unspecified: Secondary | ICD-10-CM | POA: Diagnosis not present

## 2022-06-07 DIAGNOSIS — Z7982 Long term (current) use of aspirin: Secondary | ICD-10-CM | POA: Insufficient documentation

## 2022-06-07 DIAGNOSIS — J069 Acute upper respiratory infection, unspecified: Secondary | ICD-10-CM | POA: Insufficient documentation

## 2022-06-07 DIAGNOSIS — R519 Headache, unspecified: Secondary | ICD-10-CM | POA: Diagnosis present

## 2022-06-07 DIAGNOSIS — Z20822 Contact with and (suspected) exposure to covid-19: Secondary | ICD-10-CM | POA: Diagnosis not present

## 2022-06-07 LAB — CBC WITH DIFFERENTIAL/PLATELET
Abs Immature Granulocytes: 0.01 10*3/uL (ref 0.00–0.07)
Basophils Absolute: 0 10*3/uL (ref 0.0–0.1)
Basophils Relative: 0 %
Eosinophils Absolute: 0 10*3/uL (ref 0.0–0.5)
Eosinophils Relative: 1 %
HCT: 38.9 % (ref 36.0–46.0)
Hemoglobin: 12.6 g/dL (ref 12.0–15.0)
Immature Granulocytes: 0 %
Lymphocytes Relative: 31 %
Lymphs Abs: 1.3 10*3/uL (ref 0.7–4.0)
MCH: 26.7 pg (ref 26.0–34.0)
MCHC: 32.4 g/dL (ref 30.0–36.0)
MCV: 82.4 fL (ref 80.0–100.0)
Monocytes Absolute: 0.4 10*3/uL (ref 0.1–1.0)
Monocytes Relative: 9 %
Neutro Abs: 2.5 10*3/uL (ref 1.7–7.7)
Neutrophils Relative %: 59 %
Platelets: 230 10*3/uL (ref 150–400)
RBC: 4.72 MIL/uL (ref 3.87–5.11)
RDW: 13.5 % (ref 11.5–15.5)
WBC: 4.2 10*3/uL (ref 4.0–10.5)
nRBC: 0 % (ref 0.0–0.2)

## 2022-06-07 LAB — RESP PANEL BY RT-PCR (RSV, FLU A&B, COVID)  RVPGX2
Influenza A by PCR: NEGATIVE
Influenza B by PCR: NEGATIVE
Resp Syncytial Virus by PCR: NEGATIVE
SARS Coronavirus 2 by RT PCR: NEGATIVE

## 2022-06-07 LAB — BASIC METABOLIC PANEL
Anion gap: 10 (ref 5–15)
BUN: 13 mg/dL (ref 6–20)
CO2: 28 mmol/L (ref 22–32)
Calcium: 9.4 mg/dL (ref 8.9–10.3)
Chloride: 102 mmol/L (ref 98–111)
Creatinine, Ser: 0.85 mg/dL (ref 0.44–1.00)
GFR, Estimated: >60 mL/min (ref >60)
Glucose, Bld: 107 mg/dL — ABNORMAL HIGH (ref 70–99)
Potassium: 4.2 mmol/L (ref 3.5–5.1)
Sodium: 140 mmol/L (ref 135–145)

## 2022-06-07 MED ORDER — NAPROXEN 250 MG PO TABS
500.0000 mg | ORAL_TABLET | Freq: Once | ORAL | Status: AC
  Administered 2022-06-07: 500 mg via ORAL
  Filled 2022-06-07: qty 2

## 2022-06-07 MED ORDER — IBUPROFEN 600 MG PO TABS: 600.0000 mg | ORAL_TABLET | Freq: Four times a day (QID) | ORAL | 0 refills | Status: DC | PRN

## 2022-06-07 MED ORDER — CETIRIZINE HCL 10 MG PO TABS: 10.0000 mg | ORAL_TABLET | Freq: Every day | ORAL | 0 refills | Status: DC

## 2022-06-07 MED ORDER — ACETAMINOPHEN 325 MG PO TABS
650.0000 mg | ORAL_TABLET | Freq: Once | ORAL | Status: AC
  Administered 2022-06-07: 650 mg via ORAL
  Filled 2022-06-07: qty 2

## 2022-06-07 MED ORDER — ACETAMINOPHEN 500 MG PO TABS: 500.0000 mg | ORAL_TABLET | Freq: Four times a day (QID) | ORAL | 0 refills | Status: AC | PRN

## 2022-06-07 NOTE — ED Provider Notes (Signed)
[facility] EMERGENCY DEPT Provider Note   CSN: [id number] Arrival date & time: 06/07/22  1236     History  Chief Complaint  Patient presents with   Headache    Marilyn [name] is a 57 y.o. female.  HPI     57 year old female comes in with chief complaint of headache and abdominal discomfort Patient has been having headache for the last 2 days.  Headaches started as intermittent, and remain intermittent but are more severe.  She has been taking Tylenol, which gives her transient relief.  Headaches are at the top of her head, described as sharp and pressure type headache.  She also has some URI.  Patient denies any associated one-sided weakness, numbness, vision change (may be some "fogginess") such as loss of vision or double vision, balance issues.  She has been having some congestion and pain over her ear as well.  Patient believes that the headache is worse when she is up and walking.  Otherwise there is no specific triggers, aggravating or relieving factors.  Tylenol has given transient relief.  There is no family history of brain tumor or brain bleed.  Daughter used to have migraines.  Patient does not have history of similar headaches or any headache disorder.  She denies any history of clotting disease or any neck pain.  Additionally patient also reports that today she had some abdominal discomfort.  Abdominal discomfort was mild, in the lower quadrant.  She had a lot of spinach and salad yesterday.  She denies any diarrhea, bloody stools, nausea, vomiting.  Review of system is negative for any UTI-like symptoms.   Home Medications Prior to Admission medications   Medication Sig Start Date End Date Taking? Authorizing Provider  acetaminophen (TYLENOL) 500 MG tablet Take 1 tablet (500 mg total) by mouth every 6 (six) hours as needed. 06/07/22  Yes Varney Biles, MD  cetirizine (ZYRTEC) 10 MG tablet Take 1 tablet (10 mg total) by mouth daily. 06/07/22  Yes  Varney Biles, MD  ibuprofen (ADVIL) 600 MG tablet Take 1 tablet (600 mg total) by mouth every 6 (six) hours as needed. 06/07/22  Yes Varney Biles, MD  albuterol (VENTOLIN HFA) 108 (90 Base) MCG/ACT inhaler Inhale 2 puffs into the lungs every 4 (four) hours as needed for wheezing or shortness of breath (cough, shortness of breath or wheezing.). 07/25/21   Billie Ruddy, MD  amLODipine (NORVASC) 10 MG tablet Take 1 tablet (10 mg total) by mouth daily. 01/17/22   Billie Ruddy, MD  aspirin EC 81 MG tablet Take 1 tablet (81 mg total) by mouth daily. Swallow whole. 03/30/20   Chandrasekhar, Lyda Kalata A, MD  atorvastatin (LIPITOR) 40 MG tablet Take 1 tablet (40 mg total) by mouth daily. 06/05/22   Loel Dubonnet, NP  Beclomethasone Dipropionate 40 MCG/ACT AERS Place 1 spray into the nose in the morning and at bedtime. 03/23/21   Billie Ruddy, MD  Blood Glucose Monitoring Suppl (ONETOUCH VERIO FLEX SYSTEM) w/Device KIT Use to check blood glucose TID or prn 07/10/21   Billie Ruddy, MD  Blood Pressure Monitoring (COMFORT TOUCH BP CUFF/LARGE) MISC Please measure blood pressure 1 time per day 1-2 hours after taking medication and resting for 5-10 minutes, please keep a log and bring to all future appointments. DX I10 12/18/21   Skeet Latch, MD  Cholecalciferol (VITAMIN D) 125 MCG (5000 UT) CAPS Take 1 tablet by mouth daily.    [provider]  cyclobenzaprine (FLEXERIL) 5  MG tablet Take 1 tablet (5 mg total) by mouth 3 (three) times daily as needed. 12/27/17   Jaynee Eagles, PA-C  EPIPEN 2-PAK 0.3 MG/0.3ML SOAJ injection Inject 0.3 mg into the muscle as needed for anaphylaxis. 07/25/21   Mosie Lukes, MD  glucose blood (ONETOUCH VERIO) test strip TEST THREE TIMES A DAY 05/15/22   Billie Ruddy, MD  Lancets [facility] DELICA PLUS [id number]) MISC 1 - DAILY 05/15/22   Billie Ruddy, MD  levothyroxine (SYNTHROID) 100 MCG tablet Take 1 tablet (100 mcg total) by mouth daily. 01/17/22    Billie Ruddy, MD  metoprolol succinate (TOPROL-XL) 25 MG 24 hr tablet TAKE 1 TABLET BY MOUTH DAILY 05/14/22   Skeet Latch, MD  omeprazole (PRILOSEC) 40 MG capsule Take 1 capsule (40 mg total) by mouth 2 (two) times daily. 07/12/21 05/14/22  Ladene Artist, MD  ondansetron (ZOFRAN-ODT) 4 MG disintegrating tablet Take 1 tablet (4 mg total) by mouth every 8 (eight) hours as needed for nausea or vomiting. 07/30/19   Davonna Belling, MD  vitamin B-12 (CYANOCOBALAMIN) 500 MCG tablet Take 500 mcg by mouth daily.    [provider]      Allergies    Promethazine-dm, Blain Pais allergy], Codeine, Fluticasone propionate, Promethazine, and Flonase [fluticasone propionate]    Review of Systems   Review of Systems  All other systems reviewed and are negative.   Physical Exam Updated Vital Signs BP 135/82 (BP Location: Right Arm)   Pulse 61   Temp 98.5 F (36.9 C) (Oral)   Resp 16   LMP 02/21/2013   SpO2 100%  Physical Exam Vitals and nursing note reviewed.  Constitutional:      Appearance: She is well-developed.  HENT:     Head: Atraumatic.  Cardiovascular:     Rate and Rhythm: Normal rate.  Pulmonary:     Effort: Pulmonary effort is normal.  Abdominal:     General: There is no distension.     Palpations: Abdomen is soft.     Tenderness: There is no guarding.     Comments: Mild suprapubic tenderness, but no left lower quadrant tenderness, no Murphy's or McBurney's.  Musculoskeletal:     Cervical back: Normal range of motion and neck supple.  Skin:    General: Skin is warm and dry.  Neurological:     Mental Status: She is alert and oriented to person, place, and time.     ED Results / Procedures / Treatments   Labs (all labs ordered are listed, but only abnormal results are displayed) Labs Reviewed  BASIC METABOLIC PANEL - Abnormal; Notable for the following components:      Result Value   Glucose, Bld 107 (*)    All other components within normal limits   RESP PANEL BY RT-PCR (RSV, FLU A&B, COVID)  [id number]  CBC WITH DIFFERENTIAL/PLATELET    EKG None  Radiology CT Head Wo Contrast  Result Date: 06/07/2022 CLINICAL DATA:  Headache EXAM: CT HEAD WITHOUT CONTRAST TECHNIQUE: Contiguous axial images were obtained from the base of the skull through the vertex without intravenous contrast. RADIATION DOSE REDUCTION: This exam was performed according to the departmental dose-optimization program which includes automated exposure control, adjustment of the mA and/or kV according to patient size and/or use of iterative reconstruction technique. COMPARISON:  None Available. FINDINGS: Brain: No evidence of acute infarction, hemorrhage, hydrocephalus, extra-axial collection or mass lesion/mass effect. Vascular: No hyperdense vessel or unexpected calcification. Skull: Normal. Negative for fracture or  focal lesion. Sinuses/Orbits: No acute finding. Other: None. IMPRESSION: No specific etiology for headaches identified. Electronically Signed   By: Marin Roberts M.D.   On: 06/07/2022 16:27   ECHOCARDIOGRAM COMPLETE  Result Date: 06/06/2022    ECHOCARDIOGRAM REPORT   Patient Name:   Marilyn [name] Date of Exam: 06/06/2022 Medical Rec #:  [id number]              Height:       63.0 in Accession #:    [id number]             Weight:       201.0 lb Date of Birth:  01-26-1966               BSA:          1.938 m Patient Age:    71 years               BP:           126/74 mmHg Patient Gender: F                      HR:           85 bpm. Exam Location:  Outpatient Procedure: 2D Echo, 3D Echo, Cardiac Doppler, Color Doppler and Strain Analysis Indications:    Pericardial Effusion  History:        Patient has prior history of Echocardiogram examinations, most                 recent 08/03/2021. Risk Factors:Diabetes, Hypertension,                 Dyslipidemia and Former Smoker.  Sonographer:    Leavy Cella RDCS Referring Phys: [id number] TIFFANY [place] IMPRESSIONS  1. Left  ventricular ejection fraction, by estimation, is 65 to 70%. The left ventricle has normal function. The left ventricle has no regional wall motion abnormalities. There is mild concentric left ventricular hypertrophy. Indeterminate diastolic filling due to E-A fusion.  2. Right ventricular systolic function is normal. The right ventricular size is normal. There is normal pulmonary artery systolic pressure.  3. No evidence of mitral valve regurgitation.  4. The aortic valve was not well visualized. Aortic valve regurgitation is not visualized.  5. The inferior vena cava is normal in size with greater than 50% respiratory variability, suggesting right atrial pressure of 3 mmHg. Comparison(s): No significant change from prior study. FINDINGS  Left Ventricle: Left ventricular ejection fraction, by estimation, is 65 to 70%. The left ventricle has normal function. The left ventricle has no regional wall motion abnormalities. The left ventricular internal cavity size was normal in size. There is  mild concentric left ventricular hypertrophy. Indeterminate diastolic filling due to E-A fusion. Right Ventricle: The right ventricular size is normal. Right ventricular systolic function is normal. There is normal pulmonary artery systolic pressure. The tricuspid regurgitant velocity is 2.28 m/s, and with an assumed right atrial pressure of 3 mmHg,  the estimated right ventricular systolic pressure is 03.5 mmHg. Left Atrium: Left atrial size was normal in size. Right Atrium: Right atrial size was normal in size. Pericardium: Trivial pericardial effusion is present. Mitral Valve: No evidence of mitral valve regurgitation. Tricuspid Valve: Tricuspid valve regurgitation is trivial. Aortic Valve: The aortic valve was not well visualized. Aortic valve regurgitation is not visualized. Pulmonic Valve: Pulmonic valve regurgitation is not visualized. Aorta: The aortic root and ascending aorta are structurally normal, with no evidence of  dilitation. Venous: The inferior vena cava is normal in size with greater than 50% respiratory variability, suggesting right atrial pressure of 3 mmHg. IAS/Shunts: No atrial level shunt detected by color flow Doppler.  LEFT VENTRICLE PLAX 2D LVIDd:         2.89 cm   Diastology LVIDs:         1.74 cm   LV e' medial:    9.50 cm/s LV PW:         1.20 cm   LV E/e' medial:  8.5 LV IVS:        1.24 cm   LV e' lateral:   8.49 cm/s LVOT diam:     2.10 cm   LV E/e' lateral: 9.6 LV SV:         45 LV SV Index:   23 LVOT Area:     3.46 cm  RIGHT VENTRICLE RV Basal diam:  3.02 cm RV Mid diam:    3.61 cm RV S prime:     7.29 cm/s TAPSE (M-mode): 1.0 cm LEFT ATRIUM             Index        RIGHT ATRIUM          Index LA diam:        4.00 cm 2.06 cm/m   RA Area:     8.23 cm LA Vol (A2C):   39.3 ml 20.28 ml/m  RA Volume:   14.10 ml 7.28 ml/m LA Vol (A4C):   31.3 ml 16.15 ml/m LA Biplane Vol: 35.2 ml 18.17 ml/m  AORTIC VALVE LVOT Vmax:   83.80 cm/s LVOT Vmean:  54.000 cm/s LVOT VTI:    0.131 m  AORTA Ao Root diam: 3.00 cm Ao Asc diam:  3.30 cm MITRAL VALVE               TRICUSPID VALVE MV Area (PHT): 4.06 cm    TR Peak grad:   20.8 mmHg MV Decel Time: 187 msec    TR Vmax:        228.00 cm/s MV E velocity: 81.20 cm/s MV A velocity: 93.80 cm/s  SHUNTS MV E/A ratio:  0.87        Systemic VTI:  0.13 m                            Systemic Diam: 2.10 cm Phineas Inches Electronically signed by Phineas Inches Signature Date/Time: 06/06/2022/2:35:55 PM    Final     Procedures Procedures    Medications Ordered in ED Medications  acetaminophen (TYLENOL) tablet 650 mg (has no administration in time range)  naproxen (NAPROSYN) tablet 500 mg (has no administration in time range)    ED Course/ Medical Decision Making/ A&P                           Medical Decision Making Amount and/or Complexity of Data Reviewed Labs: ordered. Radiology: ordered.  Risk OTC drugs. Prescription drug management.  This patient presents to the ED  with chief complaint(s) of headache with a secondary complaint of abdominal discomfort with pertinent past medical history of hyperlipidemia, hypertension, pericardial effusion, hypothyroidism.The complaint involves an extensive differential diagnosis and also carries with it a high risk of complications and morbidity.    The differential diagnosis for the headache includes: Primary headaches - including migrainous headaches, cluster headaches, tension headaches. Sinus headaches ICH Cavernous sinus  thrombosis Tumor Vascular headaches AV malformation Brain aneurysm Muscular headaches  Patient does not have any red flags suggesting elevated ICP.  Given that she is age over 21, she has new headaches, CT scan of the brain was ordered.  CT scan does not reveal any evidence of brain bleed, tumor or mass effect.  Additionally, patient has abdominal discomfort.  Abdominal exam is reassuring.  No rebound or guarding.  Labs are reassuring.  She has no associated UTI-like symptoms.  I reviewed previous CT scans.  It appears that patient has a diverticulosis, but clinically she does not have any diverticulitis right now.  Differential considered includes UTI, diverticulitis, colitis.  I do not think a CT scan of the abdomen is indicated at this time.  Clinically, this does not appear to be diverticulitis and we will not start patient on any antibiotics.  Return precautions however have been discussed with the headache and the abdominal pain.  Additional history obtained: Additional history obtained from family Records reviewed  previous CT scans have been reviewed  Independent labs interpretation:  The following labs were independently interpreted: CBC, BMP which are completely reassuring.  Independent visualization and interpretation of imaging: - I independently visualized the following imaging with scope of interpretation limited to determining acute life threatening conditions related to emergency  care: CT scan of the brain, which revealed no evidence of acute brain bleed   Final Clinical Impression(s) / ED Diagnoses Final diagnoses:  Generalized headaches  Upper respiratory tract infection, unspecified type  Lower abdominal pain    Rx / DC Orders ED Discharge Orders          Ordered    acetaminophen (TYLENOL) 500 MG tablet  Every 6 hours PRN        06/07/22 1820    ibuprofen (ADVIL) 600 MG tablet  Every 6 hours PRN        06/07/22 1820    cetirizine (ZYRTEC) 10 MG tablet  Daily        06/07/22 1820              Varney Biles, MD 06/07/22 1830

## 2022-06-07 NOTE — Telephone Encounter (Signed)
Pt c/o of Chest Pain: STAT if CP now or developed within 24 hours  1. Are you having CP right now? Yes, soreness tenderness in the middle of her chest  2. Are you experiencing any other symptoms (ex. SOB, nausea, vomiting, sweating)? Nausea, dizziness  3. How long have you been experiencing CP? yesterday  4. Is your CP continuous or coming and going? Comes and goes  5. Have you taken Nitroglycerin? No, tylenol ?   Patient states ever since she had her test yesterday she has been sore in her chest. She says yesterday it was very painful so she took a tylenol. She says she has also been dizzy and nauseas. She says she has also had a headache for 3 days.

## 2022-06-07 NOTE — Telephone Encounter (Signed)
Call transferred as a stat call    Headache, nauseated, the room is spinning, active chest pain started yesterday. Verified that there was someone in the home with the patient. She states that her son is with her, advised patient to be driven to the ED for prompt evaluation.

## 2022-06-07 NOTE — Discharge Instructions (Addendum)
We saw you in the ER for headache, abdominal discomfort and upper respiratory symptoms. All the results in the ER are normal, labs and imaging.  No evidence of any brain mass, brain bleed on the CT scan.  The workup in the ER is not complete, and is limited to screening for life threatening and emergent conditions only, so please see a primary care doctor for further evaluation.  Recommend that he take combination of Tylenol, ibuprofen over the next few days for your headaches. Diet should be simple in nature.  Ensure that you are drinking plenty of water. We have sent a COVID-19/flu/RSV test.  Please follow-up on the results on your MyChart app.  Please return to the ER if the headache gets severe and in not improving, you have associated new one sided numbness, tingling, weakness or confusion, seizures, poor balance or poor vision.

## 2022-06-07 NOTE — ED Triage Notes (Signed)
Pt was at her routine cardiology appointment yesterday, had a mild headache, the MD did a CT scan, sent pt home. Vss at that time. Pt states she has continued to have headache and is much worse.

## 2022-06-07 NOTE — ED Triage Notes (Signed)
Correction, pt did not have head CT yesterday ,she had a heart ultrasound

## 2022-06-08 ENCOUNTER — Encounter: Payer: Self-pay | Admitting: Family Medicine

## 2022-06-10 ENCOUNTER — Encounter (HOSPITAL_BASED_OUTPATIENT_CLINIC_OR_DEPARTMENT_OTHER): Payer: Self-pay | Admitting: Cardiovascular Disease

## 2022-06-13 ENCOUNTER — Telehealth: Payer: Self-pay | Admitting: Family

## 2022-06-13 NOTE — Telephone Encounter (Addendum)
Spoke with patient and she finally did hear back from [employer] regarding her CPAP machine  She was told that it would be $1200 because she has not met her deductible  Patient started trying to reach [employer] several months ago and had met her deductible at that time Explained to me could not pay $1200 for machine and is disappointed that this has happened  Discussed with Paulino Door RN clinical supervisor who suggested I reach out to Social worker Drema Dallas   Message sent

## 2022-06-13 NOTE — Telephone Encounter (Signed)
  Pt requesting to speak with Urban Gibson or [place]. She said, its about her cpap machine

## 2022-06-14 ENCOUNTER — Telehealth (HOSPITAL_BASED_OUTPATIENT_CLINIC_OR_DEPARTMENT_OTHER): Payer: Self-pay | Admitting: Licensed Clinical Social Worker

## 2022-06-14 NOTE — Telephone Encounter (Signed)
CSW received referral to assist patient with CPAP. CSW attempted contact and left message for return call. Raquel Sarna, [place], [place]

## 2022-06-18 ENCOUNTER — Telehealth (HOSPITAL_COMMUNITY): Payer: Self-pay | Admitting: Licensed Clinical Social Worker

## 2022-06-18 NOTE — Telephone Encounter (Signed)
Patient returned call stating that she attempted to pick up her CPAP machine and was told it would cost $1200 as she has not met her deductible yet for the new year. Patient shared her frustrations as she started the process last Fall when she had met the deductible and it took so long for a response back regarding picking up the equipment. She reports that she spoke with Olin Hauser at [facility] at [phone]. CSW will contact and request further information on Patient assistance Programs through [employer] for CPAP and return call to patient. Raquel Sarna, [place], [place]

## 2022-06-18 NOTE — Telephone Encounter (Signed)
CSW spoke with Darlina Guys from [facility] and shared the financial concerns form the patient. CSW inquired about eligibility for the Hardship program for CPAP at [facility] and provided the application. CSW contacted patient and emailed the application to her and discussed process. Patient verbalizes understanding and will follow up with application and reach out to CSW as needed. Raquel Sarna, [place], [place]

## 2022-06-18 NOTE — Telephone Encounter (Signed)
Patient called to say Marilyn [name] states they just received her cpap order on January 4th and they called her on the 8th and it is now another new year and she has not met her deductible and has to pay $1200. Down payment of $244 and 3 payments of $75 and one more payment of $75. Patient says she can not afford these charges. Marilyn [name] new suggested she could ask if she could get a cheaper payment plan. Patient says she has other charges for other medical bills as well. She is looking to receive a patient assistance form to see if they can help from Marilyn [name].

## 2022-06-20 NOTE — Telephone Encounter (Signed)
Marilyn Liv, LCSW  Alvina Filbert B, LPN [place], I was able to get the patient a Hardship application from [place] to see if some or all of the deductible could be waived. The patient will complete and submit to [employer] and keep me posted on her progress. If needed, I will see able accessing the Patient Care Fund if this doesn't work. Thanks, Kennyth Lose

## 2022-06-27 ENCOUNTER — Telehealth (HOSPITAL_COMMUNITY): Payer: Self-pay | Admitting: Licensed Clinical Social Worker

## 2022-06-27 NOTE — Telephone Encounter (Signed)
CSW contacted patient to follow up on CPAP. Patient states she is still struggling to get the equipment. She reported that she will not need the Patient Assistance program as her insurance has minimal fees to obtain the CPAP although patient does not have the monies at the [facility]. CSW discussed assistance form the Patient Care Fund and she will send needed documents to [facility] tomorrow for further follow up. Patient grateful for the support and assistance. Raquel Sarna, [place], [place]

## 2022-06-29 ENCOUNTER — Encounter: Payer: Self-pay | Admitting: Gastroenterology

## 2022-07-02 ENCOUNTER — Telehealth (HOSPITAL_COMMUNITY): Payer: Self-pay | Admitting: Licensed Clinical Social Worker

## 2022-07-02 NOTE — Telephone Encounter (Signed)
CSW attempted to assist patient with funds for her CPAP from the Patient [place]. CSW contact [place] and patient will need to pick up the machine first and they will monitor for 3 month compliance before insurance will cover the cost. Patient will need to make an appointment to pick up machine and go from there. Patient verbalizes understanding and will return call to CSW for financial assistance post receipt of the machine. Marilyn [name], [place], [place]

## 2022-07-18 ENCOUNTER — Other Ambulatory Visit: Payer: Self-pay | Admitting: Family Medicine

## 2022-07-18 DIAGNOSIS — I10 Essential (primary) hypertension: Secondary | ICD-10-CM

## 2022-07-18 DIAGNOSIS — E890 Postprocedural hypothyroidism: Secondary | ICD-10-CM

## 2022-07-19 NOTE — Telephone Encounter (Signed)
Patient saw Dr Oval Linsey November 2023

## 2022-07-31 ENCOUNTER — Other Ambulatory Visit: Payer: Self-pay

## 2022-07-31 MED ORDER — ONETOUCH DELICA PLUS LANCET30G MISC: 2 refills | Status: DC

## 2022-08-03 ENCOUNTER — Encounter: Payer: Self-pay | Admitting: Psychiatry

## 2022-08-03 ENCOUNTER — Ambulatory Visit: Payer: BC Managed Care – PPO | Admitting: Internal Medicine

## 2022-08-03 ENCOUNTER — Ambulatory Visit: Payer: BC Managed Care – PPO | Admitting: Psychiatry

## 2022-08-03 ENCOUNTER — Encounter: Payer: Self-pay | Admitting: Internal Medicine

## 2022-08-03 VITALS — BP 114/69 | HR 74 | Ht 63.0 in | Wt 197.0 lb

## 2022-08-03 VITALS — BP 116/68 | HR 73 | Ht 63.0 in | Wt 197.6 lb

## 2022-08-03 DIAGNOSIS — E890 Postprocedural hypothyroidism: Secondary | ICD-10-CM | POA: Diagnosis not present

## 2022-08-03 DIAGNOSIS — I493 Ventricular premature depolarization: Secondary | ICD-10-CM

## 2022-08-03 DIAGNOSIS — M5481 Occipital neuralgia: Secondary | ICD-10-CM | POA: Diagnosis not present

## 2022-08-03 DIAGNOSIS — M542 Cervicalgia: Secondary | ICD-10-CM | POA: Diagnosis not present

## 2022-08-03 DIAGNOSIS — M5412 Radiculopathy, cervical region: Secondary | ICD-10-CM | POA: Diagnosis not present

## 2022-08-03 DIAGNOSIS — R1310 Dysphagia, unspecified: Secondary | ICD-10-CM

## 2022-08-03 DIAGNOSIS — R002 Palpitations: Secondary | ICD-10-CM

## 2022-08-03 LAB — T4, FREE: Free T4: 1.01 ng/dL (ref 0.60–1.60)

## 2022-08-03 LAB — TSH: TSH: 0.58 u[IU]/mL (ref 0.35–5.50)

## 2022-08-03 MED ORDER — GABAPENTIN 300 MG PO CAPS: 300.0000 mg | ORAL_CAPSULE | Freq: Every day | ORAL | 8 refills | Status: DC

## 2022-08-03 NOTE — Progress Notes (Signed)
Referring:  Billie Ruddy, MD [place],  [place] [id number]  PCP: Billie Ruddy, MD  Neurology was asked to evaluate Marilyn [name], a 57 year old female for a chief complaint of headaches.  Our recommendations of care will be communicated by shared medical record.    CC:  headaches  History provided from self  HPI:  Medical co-morbidities: DM, HTN, HLD, CAD, hypothyroidism  The patient presents for evaluation of headaches which began one month ago. They are associated with photophobia, no phonophobia or nausea. Headaches are described as right sided pressure and shooting pain which radiates down her neck. She does report tension and stiffness of the neck as well. Pain will occasionally radiate down her arm and she has noticed decreased grip strength on the right. Headaches can last from 20 minutes to 2 hours at a time. She is currently averaging 5 headaches per month. She takes Tylenol or Flexeril as needed which does help.  [place] 06/07/22: unremarkable  Headache History: Onset: 1 month ago Triggers: no Aura: blurry vision Location: right neck, occiput Quality/Description: pressure, sharp, throbbing Associated Symptoms:  Photophobia: yes  Phonophobia: no  Nausea: no Worse with activity?: yes Duration of headaches: 20 minutes to a few hours  Headache days per month: 5 Headache free days per month: 25  Current Treatment: Abortive Tylenol  Preventative none  Prior Therapies                                 Amlodipine 10 mg daily Metoprolol 25 mg daily Flexeril 5 mg PRN Zofran Tylenol   LABS: CBC    Component Value Date/Time   WBC 4.2 06/07/2022 1406   RBC 4.72 06/07/2022 1406   HGB 12.6 06/07/2022 1406   HGB 12.3 11/17/2021 1532   HGB 13.0 12/30/2015 1434   HCT 38.9 06/07/2022 1406   HCT 37.5 11/17/2021 1532   HCT 37.9 12/30/2015 1434   PLT 230 06/07/2022 1406   PLT 231 11/17/2021 1532   MCV 82.4 06/07/2022 1406   MCV 83  11/17/2021 1532   MCV 80.6 12/30/2015 1434   MCH 26.7 06/07/2022 1406   MCHC 32.4 06/07/2022 1406   RDW 13.5 06/07/2022 1406   RDW 13.3 11/17/2021 1532   RDW 13.1 12/30/2015 1434   LYMPHSABS 1.3 06/07/2022 1406   LYMPHSABS 2.5 12/30/2015 1434   MONOABS 0.4 06/07/2022 1406   MONOABS 0.3 12/30/2015 1434   EOSABS 0.0 06/07/2022 1406   EOSABS 0.1 12/30/2015 1434   BASOSABS 0.0 06/07/2022 1406   BASOSABS 0.0 12/30/2015 1434      Latest Ref Rng & Units 06/07/2022    2:06 PM 05/17/2022    7:56 AM 04/20/2022   11:52 AM  CMP  Glucose 70 - 99 mg/dL 107  105  89   BUN 6 - 20 mg/dL '13  14  11   '$ Creatinine 0.44 - 1.00 mg/dL 0.85  1.07  0.90   Sodium 135 - 145 mmol/L 140  140  139   Potassium 3.5 - 5.1 mmol/L 4.2  4.4  4.1   Chloride 98 - 111 mmol/L 102  102  104   CO2 22 - 32 mmol/L '28  23  28   '$ Calcium 8.9 - 10.3 mg/dL 9.4  8.5  8.6   Total Protein 6.0 - 8.5 g/dL  7.7  7.7   Total Bilirubin 0.0 - 1.2 mg/dL  <0.2  0.3  Alkaline Phos 44 - 121 IU/L  97  55   AST 0 - 40 IU/L  25  20   ALT 0 - 32 IU/L  24  20      IMAGING:  [facility] 06/07/22: unremarkable  Imaging independently reviewed on August 03, 2022   Current Outpatient Medications on File Prior to Visit  Medication Sig Dispense Refill   acetaminophen (TYLENOL) 500 MG tablet Take 1 tablet (500 mg total) by mouth every 6 (six) hours as needed. 20 tablet 0   albuterol (VENTOLIN HFA) 108 (90 Base) MCG/ACT inhaler Inhale 2 puffs into the lungs every 4 (four) hours as needed for wheezing or shortness of breath (cough, shortness of breath or wheezing.). 6.7 g 1   amLODipine (NORVASC) 10 MG tablet TAKE 1 TABLET BY MOUTH DAILY 90 tablet 1   aspirin EC 81 MG tablet Take 1 tablet (81 mg total) by mouth daily. Swallow whole. 90 tablet 3   atorvastatin (LIPITOR) 40 MG tablet Take 1 tablet (40 mg total) by mouth daily. 90 tablet 3   Beclomethasone Dipropionate 40 MCG/ACT AERS Place 1 spray into the nose in the morning and at bedtime. 6.8 g 1   Blood  Glucose Monitoring Suppl ([place]) w/Device KIT Use to check blood glucose TID or prn 1 kit 0   Blood Pressure Monitoring (COMFORT TOUCH BP CUFF/LARGE) MISC Please measure blood pressure 1 time per day 1-2 hours after taking medication and resting for 5-10 minutes, please keep a log and bring to all future appointments. DX I10 1 each 0   cetirizine (ZYRTEC) 10 MG tablet Take 1 tablet (10 mg total) by mouth daily. 5 tablet 0   Cholecalciferol (VITAMIN D) 125 MCG (5000 UT) CAPS Take 1 tablet by mouth daily.     cyclobenzaprine (FLEXERIL) 5 MG tablet Take 1 tablet (5 mg total) by mouth 3 (three) times daily as needed. 90 tablet 1   EPIPEN 2-PAK 0.3 MG/0.3ML SOAJ injection Inject 0.3 mg into the muscle as needed for anaphylaxis. 1 each 0   glucose blood (ONETOUCH VERIO) test strip TEST THREE TIMES A DAY 100 strip 2   ibuprofen (ADVIL) 600 MG tablet Take 1 tablet (600 mg total) by mouth every 6 (six) hours as needed. 12 tablet 0   Lancets (ONETOUCH DELICA PLUS Q000111Q) MISC Check  3 times daily. 100 each 2   levothyroxine (SYNTHROID) 100 MCG tablet TAKE 1 TABLET BY MOUTH DAILY 90 tablet 1   metoprolol succinate (TOPROL-XL) 25 MG 24 hr tablet TAKE 1 TABLET BY MOUTH DAILY 90 tablet 2   ondansetron (ZOFRAN-ODT) 4 MG disintegrating tablet Take 1 tablet (4 mg total) by mouth every 8 (eight) hours as needed for nausea or vomiting. 8 tablet 0   vitamin B-12 (CYANOCOBALAMIN) 500 MCG tablet Take 500 mcg by mouth daily.     omeprazole (PRILOSEC) 40 MG capsule Take 1 capsule (40 mg total) by mouth 2 (two) times daily. 30 capsule 11   No current facility-administered medications on file prior to visit.     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   Aortic atherosclerosis ([place]) 02/07/2022   ASTHMA 01/30/2007   Asthma    Breast tumor    right; benign   Chest pain 01/24/2017   CHEST PAIN 01/29/2009   Qualifier: Diagnosis of  By: Silvio Pate MD, [facility]    Essential hypertension 07/26/2021   GERD 01/31/2007   Hemorrhoids    Hiatal hernia    Hyperlipidemia    Hypersomnolence 12/15/2012   HYPOTHYROIDISM 01/30/2007   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   Neck pain 09/09/2011   Obesity 04/11/2014   Pericardial effusion 02/07/2022    Past Surgical History Past Surgical History:  Procedure Laterality Date   BREAST BIOPSY Right 12/2014   BREAST EXCISIONAL BIOPSY Right 2016   BREAST LUMPECTOMY WITH RADIOACTIVE SEED LOCALIZATION Right 02/25/2015   Procedure: BREAST LUMPECTOMY WITH RADIOACTIVE SEED LOCALIZATION;  Surgeon: Alphonsa Overall, MD;  Location: [facility];  Service: General;  Laterality: Right;   [place]     broken jaw     CARPAL TUNNEL RELEASE  2001   FRACTURE SURGERY     REDUCTION MAMMAPLASTY Bilateral    THYROIDECTOMY     TUBAL LIGATION  1987    Social History: Social History   Tobacco Use   Smoking status: Former    Packs/day: 0.25    Years: 3.00    Total pack years: 0.75    Types: Cigarettes    Quit date: 06/04/1980    Years since quitting: 42.1   Smokeless tobacco: Never  Vaping Use   Vaping Use: Never used  Substance Use Topics   Alcohol use: No    Alcohol/week: 0.0 standard drinks of alcohol   Drug use: No    ROS: Negative for fevers, chills. Positive for headaches. All other systems reviewed and negative unless stated otherwise in  HPI.   Physical Exam:   Vital Signs: BP 114/69   Pulse 74   Ht '5\' 3"'$  (1.6 m)   Wt 197 lb (89.4 kg)   LMP 02/21/2013   BMI 34.90 kg/m  GENERAL: well appearing,in no acute distress,alert SKIN:  Color, texture, turgor normal. No rashes or lesions HEAD:  Normocephalic/atraumatic. CV:  RRR RESP: Normal respiratory effort MSK: +tenderness to palpation over right occiput and neck  NEUROLOGICAL: Mental Status: Alert, oriented to person, place and time,Follows commands Cranial Nerves: PERRL, visual fields intact to confrontation, extraocular movements intact, facial sensation intact, no facial droop or ptosis, hearing grossly intact, no dysarthria Motor: decreased grip strength right hand, otherwise muscle strength 5/5 both upper and lower extremities Reflexes: 2+ throughout Sensation: intact to light touch all 4 extremities Coordination: Finger-to- nose-finger intact bilaterally Gait: normal-based   IMPRESSION: 57 year old female with a history of DM, HTN, HLD, CAD, hypothyroidism who presents for evaluation of new headaches which began one month ago. CTH was normal. Her exam is significant for right occipital notch tenderness, suggestive of occipital neuralgia. MRI C-spine ordered as she reports radicular pain and weakness of her right upper extremity. Will start low dose gabapentin and refer to neck PT for treatment of cervicalgia/occipital neuralgia.  PLAN: -MRI C-spine -Start gabapentin 300 mg QHS -Continue Tylenol and Flexeril 5 mg as needed for headaches/neck tension -Referral to neck PT    I spent a total of 36  minutes chart reviewing and counseling the patient. Headache education was done. Discussed treatment options including preventive and acute medications, and physical therapy. Discussed medication side effects, adverse reactions and drug interactions. Written educational materials and patient instructions outlining all of the above were given.  Follow-up: 8  months   Genia Harold, MD 08/03/2022   11:19 AM

## 2022-08-03 NOTE — Patient Instructions (Addendum)
Referral to physical therapy for the neck  Start gabapentin 300 mg at bedtime for headaches  MRI of the cervical spine

## 2022-08-03 NOTE — Patient Instructions (Addendum)
Please continue Levothyroxine 100 mcg daily.  Take the thyroid hormone every day, with water, at least 30 minutes before breakfast, separated by at least 4 hours from: - acid reflux medications - calcium - iron - multivitamins  Move the multivitamins and acid reflux medication >3h after levothyroxine.  When we check thyroid tests, you cannot be on steroids or Biotin.  Please return in 6 months.

## 2022-08-03 NOTE — Progress Notes (Signed)
Patient ID: Marilyn [name], female   DOB: February 29, 1966, 57 y.o.   MRN: [id number]  HPI  Marilyn [name] is a 57 y.o.-year-old female, referred by her PCP, Dr. Volanda Napoleon, for management of hypothyroidism. Her mother, Iona Coach, and sister, Vermont Page, are also my pts.   Pt. 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 She saw Dr. Electa Sniff in the past until he retired.   She was initially on Synthroid DAW, then switched to generic.  Pt. is on levothyroxine 100 mcg daily, taken: - in am - fasting - at least 2h from b'fast - no calcium - no iron - + multivitamins - 30 min to 1h later - + PPIs - prn, in am - not on Biotin  I reviewed pt's thyroid tests: Lab Results  Component Value Date   TSH 0.80 01/12/2022   TSH 0.352 (L) 11/17/2021   TSH 0.64 07/11/2021   TSH 0.52 03/04/2020   TSH 1.06 07/17/2019   TSH 1.288 05/14/2019   TSH 1.63 05/12/2018   TSH 1.30 06/18/2017   TSH 0.174 (L) 02/20/2017   TSH 0.195 (L) 01/25/2017   FREET4 0.99 01/12/2022   FREET4 0.83 07/17/2019   FREET4 0.94 06/18/2017   T3FREE 2.1 07/18/2015   Antithyroid antibodies: No results found for: "THGAB" No components found for: "TPOAB"  Pt mentions: - fatigue - weight gain and increased appetite - both heat and cold intolerance - constipation - hair loss - joint and muscle pains  However, at today's visit, patient mentions that she is mostly bothered by the following:  -Dysphagia with pills -Pressure in the back of her neck so that she cannot swallow well and sometimes has to spit -She feels a blockage in her throat -She cannot spit phlegm -Also, problems breathing-had a panic attack when she had a sleep study -Feeling strangled especially when more anxious but also with drinking water or eating something hot -she is scared to eat in public but also at home. -voice giving out -She also has pressure in R ear and HAs. Sees neurology.  She saw ENT in  the past and had the following studies: CT neck (10/28/2018): Pharynx and larynx: No mucosal or submucosal lesion is seen.   Salivary glands: Parotid and submandibular glands appear symmetric and normal. No evidence of inflammation or stone disease.   Thyroid: Diminutive thyroid tissue.  No mass.   Lymph nodes: No enlarged or low-density nodes on either side of the neck.   Vascular: No abnormal vascular finding. Jugular veins widely patent.   Limited intracranial: Normal   Visualized orbits: Normal   Mastoids and visualized paranasal sinuses: Clear   Skeleton: Old mandibular fracture on the right, healed. No significant bone finding. No evidence of significant dental or periodontal disease.  Barium swallow (11/21/2018): There is premature spillage of the barium bolus into the vallecula and hypopharynx with every swallow. Otherwise normal oral and pharyngeal phase of swallowing, with no laryngeal penetration or tracheobronchial aspiration. No significant barium retention in the pharynx. No evidence of pharyngeal mass, stricture or diverticulum. No significant cricopharyngeus muscle dysfunction.   Marked esophageal dysmotility, characterized by intermittent prominent weakening of primary peristalsis throughout the thoracic esophagus. Small hiatal hernia, which appears fixed. Severe gastroesophageal reflux elicited to the level of the thoracic inlet with water siphon test and Valsalva maneuver. There is granularity of the esophageal mucosa throughout the thoracic esophagus compatible with mild reflux esophagitis. No evidence of esophageal mass, stricture or ulcer. Patient  was unable to swallow the barium tablet, which limits evaluation.   IMPRESSION: 1. Small hiatal hernia.  Severe gastroesophageal reflux elicited. 2. 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 3. Mild reflux esophagitis. No evidence of esophageal  mass, stricture or ulcer, see comments. 4. Premature spillage of barium bolus into the vallecula and hypopharynx with every swallow. Otherwise normal oral and pharyngeal phases of swallowing.   She has + FH of thyroid disorders in: sister and mother (also my pts). No FH of thyroid cancer. No h/o radiation tx to head or neck. No recent use of iodine supplements.  No herbal supplements.  No recent steroid use.  Pt. also has a history of CAD, h/o pericardial effusion, HTN, HL, OSA, GERD, vitamin D deficiency.  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   Aortic atherosclerosis ([place]) 02/07/2022   ASTHMA 01/30/2007   Asthma    Breast tumor    right; benign   Chest pain 01/24/2017   CHEST PAIN 01/29/2009   Qualifier: Diagnosis of  By: Silvio Pate MD, [facility]    Essential hypertension 07/26/2021   GERD 01/31/2007   Hemorrhoids    Hiatal hernia    Hyperlipidemia    Hypersomnolence 12/15/2012   HYPOTHYROIDISM 01/30/2007   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   Neck pain 09/09/2011   Obesity 04/11/2014   Pericardial effusion 02/07/2022   Past Surgical History:  Procedure Laterality Date   BREAST BIOPSY Right 12/2014   BREAST EXCISIONAL BIOPSY Right 2016   BREAST LUMPECTOMY WITH RADIOACTIVE SEED LOCALIZATION Right 02/25/2015   Procedure: BREAST LUMPECTOMY WITH RADIOACTIVE SEED LOCALIZATION;  Surgeon: Alphonsa Overall, MD;  Location: [facility];  Service: General;  Laterality: Right;   BREAST REDUCTION SURGERY  1986   BREAST SURGERY     broken jaw     CARPAL TUNNEL RELEASE  2001   FRACTURE SURGERY     REDUCTION MAMMAPLASTY Bilateral    THYROIDECTOMY     TUBAL LIGATION  1987   Social History   Socioeconomic History   Marital status: Single    Spouse name: Not  on file   Number of children: 3   Years of education: Not on file   Highest education level: Not on file  Occupational History   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[employer]    Employer: [employer]  Tobacco Use   Smoking status: Former    Packs/day: 0.25    Years: 3.00    Total pack years: 0.75    Types: Cigarettes    Quit date: 06/04/1980    Years since quitting: 42.1   Smokeless tobacco: Never  Vaping Use   Vaping Use: Never used  Substance and Sexual Activity   Alcohol use: No    Alcohol/week: 0.0 standard drinks of alcohol   Drug use: No   Sexual activity: Not Currently    Comment: 1st intercourse 12 yo-5 partners  Other Topics Concern   Not on file  Social History Narrative   Married   Social Determinants of Health   Financial Resource Strain: Low Risk  (07/26/2021)   Overall Financial Resource Strain (CARDIA)    Difficulty of Paying Living Expenses: Not hard at all  Food Insecurity: No Food Insecurity (07/26/2021)   Hunger Vital Sign    Worried About Running Out of  Food in the Last Year: Never true    [place] in the Last Year: Never true  Transportation Needs: No Transportation Needs (07/26/2021)   PRAPARE - Hydrologist (Medical): No    Lack of Transportation (Non-Medical): No  Physical Activity: Insufficiently Active (07/26/2021)   Exercise Vital Sign    Days of Exercise per Week: 2 days    Minutes of Exercise per Session: 40 min  Stress: Not on file  Social Connections: Not on file  Intimate Partner Violence: Not on file   Current Outpatient Medications on File Prior to Visit  Medication Sig Dispense Refill   acetaminophen (TYLENOL) 500 MG tablet Take 1 tablet (500 mg total) by mouth every 6 (six) hours as needed. 20 tablet 0   albuterol (VENTOLIN HFA) 108 (90 Base) MCG/ACT inhaler Inhale 2 puffs into the lungs every 4 (four) hours as needed for wheezing or shortness of breath (cough, shortness of breath or wheezing.). 6.7 g 1    amLODipine (NORVASC) 10 MG tablet TAKE 1 TABLET BY MOUTH DAILY 90 tablet 1   aspirin EC 81 MG tablet Take 1 tablet (81 mg total) by mouth daily. Swallow whole. 90 tablet 3   atorvastatin (LIPITOR) 40 MG tablet Take 1 tablet (40 mg total) by mouth daily. 90 tablet 3   Beclomethasone Dipropionate 40 MCG/ACT AERS Place 1 spray into the nose in the morning and at bedtime. 6.8 g 1   Blood Glucose Monitoring Suppl ([place]) w/Device KIT Use to check blood glucose TID or prn 1 kit 0   Blood Pressure Monitoring (COMFORT TOUCH BP CUFF/LARGE) MISC Please measure blood pressure 1 time per day 1-2 hours after taking medication and resting for 5-10 minutes, please keep a log and bring to all future appointments. DX I10 1 each 0   cetirizine (ZYRTEC) 10 MG tablet Take 1 tablet (10 mg total) by mouth daily. 5 tablet 0   Cholecalciferol (VITAMIN D) 125 MCG (5000 UT) CAPS Take 1 tablet by mouth daily.     cyclobenzaprine (FLEXERIL) 5 MG tablet Take 1 tablet (5 mg total) by mouth 3 (three) times daily as needed. 90 tablet 1   EPIPEN 2-PAK 0.3 MG/0.3ML SOAJ injection Inject 0.3 mg into the muscle as needed for anaphylaxis. 1 each 0   glucose blood (ONETOUCH VERIO) test strip TEST THREE TIMES A DAY 100 strip 2   ibuprofen (ADVIL) 600 MG tablet Take 1 tablet (600 mg total) by mouth every 6 (six) hours as needed. 12 tablet 0   Lancets (ONETOUCH DELICA PLUS Q000111Q) MISC Check  3 times daily. 100 each 2   levothyroxine (SYNTHROID) 100 MCG tablet TAKE 1 TABLET BY MOUTH DAILY 90 tablet 1   metoprolol succinate (TOPROL-XL) 25 MG 24 hr tablet TAKE 1 TABLET BY MOUTH DAILY 90 tablet 2   omeprazole (PRILOSEC) 40 MG capsule Take 1 capsule (40 mg total) by mouth 2 (two) times daily. 30 capsule 11   ondansetron (ZOFRAN-ODT) 4 MG disintegrating tablet Take 1 tablet (4 mg total) by mouth every 8 (eight) hours as needed for nausea or vomiting. 8 tablet 0   vitamin B-12 (CYANOCOBALAMIN) 500 MCG tablet Take 500 mcg  by mouth daily.     No current facility-administered medications on file prior to visit.   Allergies  Allergen Reactions   Promethazine-Dm    Geralyn Flash [Fish Allergy] Anaphylaxis   Codeine Nausea And Vomiting   Fluticasone Propionate Other (See Comments)  Promethazine Other (See Comments)   Flonase [Fluticasone Propionate] Other (See Comments)    Nose bleeds   Family History  Problem Relation Age of Onset   Asthma Mother    Hypertension Father    Diabetes Father    Hyperlipidemia Father    Breast cancer Sister 87   Thyroid disease Sister    Diabetes Maternal Grandmother    Heart disease Maternal Grandmother    Heart attack Maternal Grandmother 60   Stroke Maternal Grandmother    Asthma Maternal Grandfather    Heart attack Maternal Grandfather    Stroke Maternal Grandfather    Migraines Daughter    Colon cancer Neg Hx    PE: BP 116/68 (BP Location: Right Arm, Patient Position: Sitting, Cuff Size: Normal)   Pulse 73   Ht '5\' 3"'$  (1.6 m)   Wt 197 lb 9.6 oz (89.6 kg)   LMP 02/21/2013   SpO2 99%   BMI 35.00 kg/m  Wt Readings from Last 3 Encounters:  08/03/22 197 lb 9.6 oz (89.6 kg)  08/03/22 197 lb (89.4 kg)  05/14/22 201 lb (91.2 kg)   Constitutional: overweight, in NAD Eyes:  EOMI, no exophthalmos ENT: no neck masses, no cervical lymphadenopathy Cardiovascular: RRR, No MRG Respiratory: CTA B Musculoskeletal: no deformities Skin:no rashes Neurological: no tremor with outstretched hands  ASSESSMENT: 1. Hypothyroidism  2.  Dysphagia  PLAN:  1. Patient with long-standing hypothyroidism, on levothyroxine therapy. - she appears euthyroid.  - she does not appear to have a goiter, thyroid nodules, or neck compression symptoms - latest thyroid labs reviewed with pt. >> normal: Lab Results  Component Value Date   TSH 0.80 01/12/2022  - she continues on LT4 100 mcg daily - pt feels good on this dose. - we discussed about taking the thyroid hormone every day, with  water, >30 minutes before breakfast, separated by >4 hours from acid reflux medications, calcium, iron, multivitamins. Pt. is taking it correctly with the exception of taking multivitamins and PPI too close to levothyroxine.  I advised her to move these at least 3 hours later.  - will check thyroid tests today: TSH and fT4 - If labs are abnormal, she will need to return for repeat TFTs in 1.5 months - Otherwise, I will see her back in 6 months  2.  Dysphagia -Patient has bothersome neck symptoms including dysphagia, getting strangled, and voice changes -she is worried that they may be related to her thyroid.  She does mention that her thyroid vitamin was done when she was 57 years old but the symptoms started much later. -She was not sure how much thyroid was taken out at the time of her surgery but was told that the majority of the gland was resected.  We reviewed together the report of her CT scan of the neck from 10/28/2018 which mentions that the thyroid tissue was diminutive.  There were no masses.  I explained that she appeared to have had total thyroidectomy and the standard of care is to leave a sliver of thyroid tissue posteriorly to avoid damage to the parathyroid glands and recurrent laryngeal nerves.  Based on this report, the thyroid tissue left behind does not appear to be the culprit for her neck symptoms.  Reviewing her barium swallow report from 11/21/2018, it appears that she has severe esophageal dysmotility apparently caused by reflux.  Therefore, her symptoms appear to be functional, rather than structural.  She is already on PPI ands and will see Dr. Lucio Edward  with GI soon.  I advised him to discuss with him about the symptoms and see if there is any treatment for her severe esophageal dysmotility.  Component     Latest Ref Rng 08/03/2022  TSH     0.35 - 5.50 uIU/mL 0.58   T4,Free([place])     0.60 - 1.60 ng/dL 1.01   Thyroid tests are normal.  Philemon Kingdom, MD PhD [facility]  Endocrinology

## 2022-08-07 ENCOUNTER — Telehealth (HOSPITAL_BASED_OUTPATIENT_CLINIC_OR_DEPARTMENT_OTHER): Payer: Self-pay

## 2022-08-07 DIAGNOSIS — E785 Hyperlipidemia, unspecified: Secondary | ICD-10-CM

## 2022-08-07 LAB — COMPREHENSIVE METABOLIC PANEL
ALT: 19 IU/L (ref 0–32)
AST: 27 IU/L (ref 0–40)
Albumin/Globulin Ratio: 1.5 (ref 1.2–2.2)
Albumin: 4.3 g/dL (ref 3.8–4.9)
Alkaline Phosphatase: 86 IU/L (ref 44–121)
BUN/Creatinine Ratio: 11 (ref 9–23)
BUN: 10 mg/dL (ref 6–24)
Bilirubin Total: <0.2 mg/dL (ref 0.0–1.2)
CO2: 25 mmol/L (ref 20–29)
Calcium: 8.7 mg/dL (ref 8.7–10.2)
Chloride: 104 mmol/L (ref 96–106)
Creatinine, Ser: 0.89 mg/dL (ref 0.57–1.00)
Globulin, Total: 2.9 g/dL (ref 1.5–4.5)
Glucose: 76 mg/dL (ref 70–99)
Potassium: 4.4 mmol/L (ref 3.5–5.2)
Sodium: 141 mmol/L (ref 134–144)
Total Protein: 7.2 g/dL (ref 6.0–8.5)
eGFR: 75 mL/min/{1.73_m2} (ref >59)

## 2022-08-07 LAB — LIPID PANEL
Chol/HDL Ratio: 3.7 ratio (ref 0.0–4.4)
Cholesterol, Total: 139 mg/dL (ref 100–199)
HDL: 38 mg/dL — ABNORMAL LOW (ref >39)
LDL Chol Calc (NIH): 85 mg/dL (ref 0–99)
Triglycerides: 82 mg/dL (ref 0–149)
VLDL Cholesterol Cal: 16 mg/dL (ref 5–40)

## 2022-08-07 MED ORDER — ATORVASTATIN CALCIUM 80 MG PO TABS: 80.0000 mg | ORAL_TABLET | Freq: Every day | ORAL | 3 refills | Status: DC

## 2022-08-07 NOTE — Telephone Encounter (Addendum)
Called results to patient and left results on VM (ok per DPR), instructions left to call office back if patient has any questions! Labs ordered and mailed, prescriptions updated and sent to pharmacy on file     ----- Message from Loel Dubonnet, NP sent at 08/07/2022  7:58 AM EST ----- Normal kidneys, liver, electrolytes.  LDL (bad cholesterol) of 85 which is improved from previous but not yet at goal of less than 70.  Recommend increase atorvastatin to 80 mg daily with FLP/LFT in 3 months.

## 2022-08-08 ENCOUNTER — Ambulatory Visit (INDEPENDENT_AMBULATORY_CARE_PROVIDER_SITE_OTHER): Payer: BC Managed Care – PPO

## 2022-08-08 DIAGNOSIS — M5412 Radiculopathy, cervical region: Secondary | ICD-10-CM

## 2022-08-16 ENCOUNTER — Encounter: Payer: Self-pay | Admitting: Gastroenterology

## 2022-08-16 ENCOUNTER — Ambulatory Visit: Payer: BC Managed Care – PPO | Admitting: Gastroenterology

## 2022-08-16 VITALS — BP 118/68 | HR 65 | Ht 63.0 in | Wt 198.0 lb

## 2022-08-16 DIAGNOSIS — Z1211 Encounter for screening for malignant neoplasm of colon: Secondary | ICD-10-CM

## 2022-08-16 DIAGNOSIS — K219 Gastro-esophageal reflux disease without esophagitis: Secondary | ICD-10-CM | POA: Diagnosis not present

## 2022-08-16 DIAGNOSIS — R070 Pain in throat: Secondary | ICD-10-CM

## 2022-08-16 DIAGNOSIS — Z800 Family history of malignant neoplasm of digestive organs: Secondary | ICD-10-CM

## 2022-08-16 DIAGNOSIS — Z1212 Encounter for screening for malignant neoplasm of rectum: Secondary | ICD-10-CM | POA: Diagnosis not present

## 2022-08-16 MED ORDER — NA SULFATE-K SULFATE-MG SULF 17.5-3.13-1.6 GM/177ML PO SOLN: 1.0000 | Freq: Once | ORAL | 0 refills | Status: AC

## 2022-08-16 NOTE — Patient Instructions (Addendum)
You have been scheduled for a colonoscopy. Please follow written instructions given to you at your visit today.  Please pick up your prep supplies at the pharmacy within the next 1-3 days. If you use inhalers (even only as needed), please bring them with you on the day of your procedure.  Please follow up with your ENT doctor   Follow up as needed.   _______________________________________________________  If your blood pressure at your visit was 140/90 or greater, please contact your primary care physician to follow up on this.  _______________________________________________________  If you are age 72 or older, your body mass index should be between 23-30. Your Body mass index is 35.07 kg/m. If this is out of the aforementioned range listed, please consider follow up with your Primary Care Provider.  If you are age 59 or younger, your body mass index should be between 19-25. Your Body mass index is 35.07 kg/m. If this is out of the aformentioned range listed, please consider follow up with your Primary Care Provider.   ________________________________________________________  The [facility] providers would like to encourage you to use [facility] to communicate with providers for non-urgent requests or questions.  Due to long hold times on the telephone, sending your provider a message by [facility] may be a faster and more efficient way to get a response.  Please allow 48 business hours for a response.  Please remember that this is for non-urgent requests.  _______________________________________________________ Thank you for choosing me and [facility].  Pricilla Riffle. Dagoberto Ligas., MD., Marval Regal

## 2022-08-16 NOTE — Progress Notes (Addendum)
    Assessment     Throat burning, throat pain - not GI related GERD, history of an esophageal stricture, history of esophageal dysmotility Family history of colon cancer, 1st-degree relative    Recommendations    Return to ENT for further mgmt Continue omeprazole 40 mg twice daily and follow antireflux measures Schedule colonoscopy. The risks (including bleeding, perforation, infection, missed lesions, medication reactions and possible hospitalization or surgery if complications occur), benefits, and alternatives to colonoscopy with possible biopsy and possible polypectomy were discussed with the patient and they consent to proceed.     HPI    This is a 57 year old female with GERD and frequent throat burning and throat pain.  She relates infrequent minimal heartburn symptoms.  She infrequent difficulty swallowing water.  No difficulty swallowing solids.  Overall her reflux and swallowing problems are under good control.  Her main complaint is of frequent throat burning and sharp throat pains.  The symptoms do not correlate with episodes of reflux.  She has been evaluated by ENT in the past.  She is due for colon cancer screening. Denies weight loss, abdominal pain, constipation, diarrhea, change in stool caliber, melena, hematochezia, nausea, vomiting, chest pain.   EGD November 2019 - Benign-appearing esophageal stenosis - dilated - Small hiatal hernia - Otherwise normal  Colon July 2012 Normal   Labs / Imaging       Latest Ref Rng & Units 08/03/2022    1:02 PM 05/17/2022    7:56 AM 04/20/2022   11:52 AM  Hepatic Function  Total Protein 6.0 - 8.5 g/dL 7.2  7.7  7.7   Albumin 3.8 - 4.9 g/dL 4.3  4.7  4.3   AST 0 - 40 IU/L 27  25  20    ALT 0 - 32 IU/L 19  24  20    Alk Phosphatase 44 - 121 IU/L 86  97  55   Total Bilirubin 0.0 - 1.2 mg/dL <0.2  <0.2  0.3        Latest Ref Rng & Units 06/07/2022    2:06 PM 04/20/2022   11:52 AM 01/31/2022    6:12 AM  CBC  WBC 4.0 - 10.5  K/uL 4.2  4.7  4.5   Hemoglobin 12.0 - 15.0 g/dL 12.6  11.9  11.5   Hematocrit 36.0 - 46.0 % 38.9  36.9  34.9   Platelets 150 - 400 K/uL 230  225  231     Current Medications, Allergies, Past Medical History, Past Surgical History, Family History and Social History were reviewed in [employer] record.   Physical Exam: General: Well developed, well nourished, no acute distress Head: Normocephalic and atraumatic Eyes: Sclerae anicteric, EOMI Ears: Normal auditory acuity Mouth: No deformities or lesions noted Lungs: Clear throughout to auscultation Heart: Regular rate and rhythm; No murmurs, rubs or bruits Abdomen: Soft, non tender and non distended. No masses, hepatosplenomegaly or hernias noted. Normal Bowel sounds Rectal: Deferred to colonoscopy Musculoskeletal: Symmetrical with no gross deformities  Pulses:  Normal pulses noted Extremities: No edema or deformities noted Neurological: Alert oriented x 4, grossly nonfocal Psychological:  Alert and cooperative. Normal mood and affect   Izik Bingman T. Fuller Plan, MD 08/16/2022, 8:59 AM

## 2022-08-19 NOTE — Progress Notes (Unsigned)
Office Visit Note  Patient: Marilyn [name]             Date of Birth: 1966/02/29           MRN: [id number]             PCP: Billie Ruddy, MD Referring: Skeet Latch, MD Visit Date: 08/20/2022 Occupation: Elementary school teacher  Subjective:  Edema and Pain of the Right Hand, Pain and Edema of the Left Hand, and New Patient (Initial Visit) (Patient states hand pain alternates.)   History of Present Illness: Marilyn [name] is a 57 y.o. female here for evaluation of episodic pain and swelling affecting hands and feet with elevated sedimentation rate, also with pericardial effusion and equivocal imaging findings for possible amyloid.  She reports bilateral hand pain has been noticeable or more of a problem since last year around the time she started regular follow-up with Dr. Volanda Napoleon.  She notices swelling worst around the MCP joints in the hand alternating from 1 side to the other usually about 1 to 2 weeks at a time.  This is associated with morning pain and stiffness lasting several minutes but also has mild swelling redness and difficulty fully closing her grip on the affected side.  Foot pain is more recent just within the past 3 months notices pain across the top of the midfoot usually provoked with prolonged time on her feet and walking.  She describes sensation as if something is applying pressure or standing on her foot.  Does not see much swelling in the feet compared to the hands.  She is taken Tylenol and Aleve as needed for symptoms.  Currently avoiding NSAIDs in anticipation of routine screening colonoscopy coming up.  Previous history of right-sided jaw fracture no other major joint injury or surgeries.  Has family history of rheumatoid arthritis in her maternal grandmother.  07/2021 ANA neg   Activities of Daily Living:  Patient reports morning stiffness for 5-8 minutes.   Patient Reports nocturnal pain.  Difficulty dressing/grooming: Denies Difficulty  climbing stairs: Denies Difficulty getting out of chair: Denies Difficulty using hands for taps, buttons, cutlery, and/or writing: Reports  Review of Systems  Constitutional:  Negative for fatigue.  HENT:  Positive for mouth dryness. Negative for mouth sores.   Eyes:  Positive for dryness.  Respiratory:  Positive for shortness of breath.   Cardiovascular:  Positive for chest pain. Negative for palpitations.  Gastrointestinal:  Positive for constipation. Negative for blood in stool and diarrhea.  Endocrine: Positive for increased urination.  Genitourinary:  Negative for involuntary urination.  Musculoskeletal:  Positive for joint pain, joint pain, joint swelling, myalgias, morning stiffness, muscle tenderness and myalgias. Negative for gait problem and muscle weakness.  Skin:  Negative for color change, rash, hair loss and sensitivity to sunlight.  Allergic/Immunologic: Negative for susceptible to infections.  Neurological:  Positive for dizziness. Negative for headaches.  Hematological:  Negative for swollen glands.  Psychiatric/Behavioral:  Negative for depressed mood and sleep disturbance. The patient is not nervous/anxious.     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   Hypoxemia associated with sleep 02/23/2022   Pericardial effusion 02/07/2022   Aortic atherosclerosis ([place]) 02/07/2022   Essential hypertension 07/26/2021   Hyperlipidemia associated with type 2 diabetes mellitus ([place]) 08/04/2020   Diet-controlled type 2 diabetes mellitus ([place]) 07/20/2019   Swollen neck 04/09/2018   Urinary retention 08/05/2017   Atypical chest pain  01/23/2017   Nausea & vomiting 07/16/2015   RUQ abdominal pain 07/16/2015   RLQ abdominal pain 07/15/2015   Pain in lower back 11/30/2014   Acute upper respiratory infection 05/05/2014   Allergic rhinitis 05/05/2014   Hoarseness 05/05/2014   Dysphagia 05/05/2014   Obesity  04/11/2014   Edema, peripheral 04/11/2014   Cough 12/20/2012   Hypersomnolence 12/15/2012   Exertional dyspnea 12/15/2012   Right shoulder pain 12/15/2012   Abdominal pain 11/15/2011   Diabetes mellitus with coincident hypertension ([place]) 09/09/2011   Neck pain 09/09/2011   Preventative health care 04/30/2011   HOT FLASHES 12/22/2009   CHEST PAIN 01/29/2009   Acute non-recurrent maxillary sinusitis 09/25/2008   HYPERTHYROIDISM 01/31/2007   GERD 01/31/2007   Hypothyroidism 01/30/2007   Hyperlipidemia 01/30/2007   Iron deficiency anemia 01/30/2007   ASTHMA 01/30/2007    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   Aortic atherosclerosis ([place]) 02/07/2022   ASTHMA 01/30/2007   Asthma    Breast tumor    right; benign   Chest pain 01/24/2017   CHEST PAIN 01/29/2009   Qualifier: Diagnosis of  By: Silvio Pate MD, [facility]    Essential hypertension 07/26/2021   GERD 01/31/2007   Hemorrhoids    Hiatal hernia    Hyperlipidemia    Hypersomnolence 12/15/2012   HYPOTHYROIDISM 01/30/2007   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   Neck pain 09/09/2011   Obesity 04/11/2014   Pericardial effusion 02/07/2022   Sleep apnea     Family History  Problem Relation Age of Onset   Asthma Mother    Hypertension Father    Diabetes Father    Hyperlipidemia Father    Breast cancer Sister 89   Thyroid disease Sister    Diabetes Maternal Grandmother    Heart disease Maternal Grandmother    Heart attack Maternal Grandmother 60   Stroke Maternal Grandmother    Asthma Maternal Grandfather    Heart attack Maternal Grandfather    Stroke Maternal Grandfather    Migraines Daughter    Colon cancer Neg Hx    Past Surgical History:  Procedure Laterality Date   BREAST BIOPSY Right 12/2014   BREAST EXCISIONAL BIOPSY Right 2016   BREAST LUMPECTOMY WITH RADIOACTIVE SEED LOCALIZATION Right 02/25/2015   Procedure: BREAST LUMPECTOMY WITH RADIOACTIVE SEED LOCALIZATION;   Surgeon: Alphonsa Overall, MD;  Location: [facility];  Service: General;  Laterality: Right;   BREAST REDUCTION SURGERY  1986   BREAST SURGERY     broken jaw     CARPAL TUNNEL RELEASE  2001   FRACTURE SURGERY     REDUCTION MAMMAPLASTY Bilateral    THYROIDECTOMY     TUBAL LIGATION  1987   Social History   Social History Narrative   Married   Immunization History  Administered Date(s) Administered   Influenza Split 04/30/2011   Influenza Whole 03/04/2012   Influenza,inj,Quad PF,6+ Mos 04/01/2014, 07/20/2015, 02/20/2017   Influenza-Unspecified 02/02/2014   PPD Test 03/25/2017   Pneumococcal Polysaccharide-23 04/01/2014   Td 06/04/1997, 12/22/2009     Objective: Vital Signs: BP 132/80 (BP Location: Right Arm, Patient Position: Sitting, Cuff Size: Normal)   Pulse 72   Resp 14   Ht 5\' 3"  (1.6 m)   Wt 197 lb (89.4 kg)   LMP 02/21/2013   BMI 34.90 kg/m    Physical Exam Constitutional:      Appearance: She is obese.  Eyes:     Conjunctiva/sclera: Conjunctivae  normal.  Cardiovascular:     Rate and Rhythm: Normal rate and regular rhythm.  Pulmonary:     Effort: Pulmonary effort is normal.     Breath sounds: Normal breath sounds.  Musculoskeletal:     Right lower leg: No edema.     Left lower leg: No edema.  Lymphadenopathy:     Cervical: No cervical adenopathy.  Skin:    General: Skin is warm and dry.     Findings: No rash.  Neurological:     Mental Status: She is alert.  Psychiatric:        Mood and Affect: Mood normal.      Musculoskeletal Exam:  Shoulders full ROM no tenderness or swelling Elbows full ROM no tenderness or swelling Wrists full ROM no tenderness or swelling Fingers full ROM right 2nd-3rd MCP pain with full flexion, mild tenderness to pressure without palpable synovitis, bony changes 1st IP joint also without synovitis Knees full ROM no tenderness or swelling Ankles full ROM no tenderness or swelling MTPs full ROM no tenderness or  swelling   Investigation: No additional findings.  Imaging: MR CERVICAL SPINE WO CONTRAST  Result Date: 08/09/2022  [facility] [address] [place], [place] [address] [phone] NEUROIMAGING REPORT STUDY DATE: 08/08/2022 PATIENT NAME: Latory Deloera DOB: 1965/11/30 MRN: [id number] ORDERING CLINICIAN: Dr Billey Gosling CLINICAL HISTORY: 57 year old patient being evaluated in follow-up cervical radiculopathy COMPARISON FILMS: CT cervical spine 10/19/2013 EXAM: MRI cervical spine without contrast TECHNIQUE: Sagittal T1, T2, STIR, axial T2 and GRE sequences were obtained through cervical spine CONTRAST: None IMAGING SITE: [facility] imaging at [address] FINDINGS: The cervical vertebrae demonstrate loss of forward lordotic curvature with mild posterior subluxation of 2 over C3.  The vertebral body heights appear normal.  There are prominent endplate marrow degenerative changes at C6/7 with anterior and posterior osteophytes. C2-3 shows minor disc signal abnormalities facet hypertrophy but no significant compression. C3-4 also shows mild disc degenerative change and asymmetric facet hypertrophy and mild ligamentum flavum hypertrophy but no definite compression. C4-5 shows broad-based disc osteophyte protrusion along with mild facet hypertrophy resulting in mild right-sided foraminal narrowing and slight effacement of thecal sac ventrally but no definite compression. C5-6 shows left paracentral disc osteophyte protrusion resulting in effacement of thecal sac ventrally but no definite compression.  C6 7 shows prominent disc degenerative change and endplate marrow degenerative changes resulting in broad-based disc protrusion and mild ligamentum flavum and facet hypertrophy resulting in mild effacement of thecal sac ventrally but no nerve compression.  Spinal cord parenchyma shows no abnormal signal intensities.  Paraspinal soft tissue appear unremarkable.  Visualized portion of the lower brainstem  and craniovertebral junction appear unremarkable.   MRI scan of cervical spine without contrast showing prominent disc and endplate spondylitic changes at C6/7 but without definite compression. INTERPRETING PHYSICIAN: Antony Contras, MD Certified in  Neuroimaging by [place] of [employer] and [place] [employer] for Neurological Subspecialities   Recent Labs: Lab Results  Component Value Date   WBC 4.2 06/07/2022   HGB 12.6 06/07/2022   PLT 230 06/07/2022   NA 141 08/03/2022   K 4.4 08/03/2022   CL 104 08/03/2022   CO2 25 08/03/2022   GLUCOSE 76 08/03/2022   BUN 10 08/03/2022   CREATININE 0.89 08/03/2022   BILITOT <0.2 08/03/2022   ALKPHOS 86 08/03/2022   AST 27 08/03/2022   ALT 19 08/03/2022   PROT 7.2 08/03/2022   ALBUMIN 4.3 08/03/2022   CALCIUM 8.7 08/03/2022  GFRAA 96 03/04/2020   QFTBGOLDPLUS Negative 03/29/2017    Speciality Comments: No specialty comments available.  Procedures:  No procedures performed Allergies: Promethazine-dm, Blain Pais allergy], Codeine, Fluticasone propionate, Promethazine, and Flonase [fluticasone propionate]   Assessment / Plan:     Visit Diagnoses: Bilateral hand pain - Plan: Rheumatoid factor, Cyclic citrul peptide antibody, IgG, Sedimentation rate, C-reactive protein, XR Hand 2 View Right, XR Hand 2 View Left  No appreciable synovitis on exam today but reported swelling and in outside clinic exam documented. Checked xrays in clinic today with findings of osteoarthritis otherwise unremarkable. Lab workup with RF and CCP as well as repeating previously high inflammatory markers. Follow up after results to discuss medication options if needed. If workup negative plan to follow up with ultrasound exam an discuss symptomatic management.  Pericardial effusion  Previous non hemodynamically significant effusion unclear etiology. Unusual to be rheumatoid associated process without established joint disease history. Lab evaluations as  above.  Bilateral foot pain - Plan: XR Foot 2 Views Right, XR Foot 2 Views Left  Foot pain localizing to the midfoot without significant change in ROM and no focal swelling on exam. Xray shows mild degenerative arthritis in this area without much soft tissue swelling or any injury related problem. Suspect more tendinopathy and OA issue might also benefit with podiatry input if pain is ongoing.  Orders: Orders Placed This Encounter  Procedures   XR Hand 2 View Right   XR Hand 2 View Left   XR Foot 2 Views Right   XR Foot 2 Views Left   Rheumatoid factor   Cyclic citrul peptide antibody, IgG   Sedimentation rate   C-reactive protein   No orders of the defined types were placed in this encounter.    Follow-Up Instructions: No follow-ups on file.   Collier Salina, MD  Note - This record has been created using [employer].  Chart creation errors have been sought, but may not always  have been located. Such creation errors do not reflect on  the standard of medical care.

## 2022-08-20 ENCOUNTER — Ambulatory Visit: Payer: BC Managed Care – PPO | Attending: Internal Medicine | Admitting: Internal Medicine

## 2022-08-20 ENCOUNTER — Ambulatory Visit: Payer: BC Managed Care – PPO

## 2022-08-20 ENCOUNTER — Encounter: Payer: Self-pay | Admitting: Internal Medicine

## 2022-08-20 ENCOUNTER — Ambulatory Visit (INDEPENDENT_AMBULATORY_CARE_PROVIDER_SITE_OTHER): Payer: BC Managed Care – PPO

## 2022-08-20 VITALS — BP 132/80 | HR 72 | Resp 14 | Ht 63.0 in | Wt 197.0 lb

## 2022-08-20 DIAGNOSIS — I3139 Other pericardial effusion (noninflammatory): Secondary | ICD-10-CM

## 2022-08-20 DIAGNOSIS — M79641 Pain in right hand: Secondary | ICD-10-CM

## 2022-08-20 DIAGNOSIS — M79642 Pain in left hand: Secondary | ICD-10-CM | POA: Diagnosis not present

## 2022-08-20 DIAGNOSIS — M79671 Pain in right foot: Secondary | ICD-10-CM

## 2022-08-20 DIAGNOSIS — M79672 Pain in left foot: Secondary | ICD-10-CM | POA: Diagnosis not present

## 2022-08-22 LAB — SEDIMENTATION RATE: Sed Rate: 46 mm/h — ABNORMAL HIGH (ref 0–30)

## 2022-08-22 LAB — C-REACTIVE PROTEIN: CRP: 4.7 mg/L (ref <8.0)

## 2022-08-22 LAB — CYCLIC CITRUL PEPTIDE ANTIBODY, IGG: Cyclic Citrullin Peptide Ab: <16 UNITS

## 2022-08-22 LAB — RHEUMATOID FACTOR: Rheumatoid fact SerPl-aCnc: <14 IU/mL (ref <14)

## 2022-08-23 ENCOUNTER — Encounter: Payer: Self-pay | Admitting: Gastroenterology

## 2022-08-23 ENCOUNTER — Ambulatory Visit (AMBULATORY_SURGERY_CENTER): Payer: BC Managed Care – PPO | Admitting: Gastroenterology

## 2022-08-23 VITALS — BP 107/81 | HR 75 | Temp 97.8°F | Resp 12 | Ht 63.0 in | Wt 198.0 lb

## 2022-08-23 DIAGNOSIS — K633 Ulcer of intestine: Secondary | ICD-10-CM

## 2022-08-23 DIAGNOSIS — Z800 Family history of malignant neoplasm of digestive organs: Secondary | ICD-10-CM

## 2022-08-23 DIAGNOSIS — Z1211 Encounter for screening for malignant neoplasm of colon: Secondary | ICD-10-CM

## 2022-08-23 DIAGNOSIS — K635 Polyp of colon: Secondary | ICD-10-CM | POA: Diagnosis not present

## 2022-08-23 DIAGNOSIS — K630 Abscess of intestine: Secondary | ICD-10-CM | POA: Diagnosis not present

## 2022-08-23 DIAGNOSIS — D123 Benign neoplasm of transverse colon: Secondary | ICD-10-CM

## 2022-08-23 MED ORDER — SODIUM CHLORIDE 0.9 % IV SOLN: 500.0000 mL | Freq: Once | INTRAVENOUS | Status: DC

## 2022-08-23 NOTE — Progress Notes (Signed)
Uneventful anesthetic. Report to pacu rn. Vss. Care resumed by rn. 

## 2022-08-23 NOTE — Progress Notes (Signed)
Called to room to assist during endoscopic procedure.  Patient ID and intended procedure confirmed with present staff. Received instructions for my participation in the procedure from the performing physician.  

## 2022-08-23 NOTE — Op Note (Signed)
[facility] Patient Name: Marilyn [name] Procedure Date: 08/23/2022 11:18 AM MRN: [id number] Endoscopist: Ladene Artist , MD, [id number] Age: 57 Referring MD:  Date of Birth: Aug 23, 1965 Gender: Female Account #: [id number] Procedure:                Colonoscopy Indications:              Screening in patient at increased risk: Family                            history of 1st-degree relative with colorectal                            cancer Medicines:                Monitored Anesthesia Care Procedure:                Pre-Anesthesia Assessment:                           - Prior to the procedure, a History and Physical                            was performed, and patient medications and                            allergies were reviewed. The patient's tolerance of                            previous anesthesia was also reviewed. The risks                            and benefits of the procedure and the sedation                            options and risks were discussed with the patient.                            All questions were answered, and informed consent                            was obtained. Prior Anticoagulants: The patient has                            taken no anticoagulant or antiplatelet agents. ASA                            Grade Assessment: III - A patient with severe                            systemic disease. After reviewing the risks and                            benefits, the patient was deemed in satisfactory  condition to undergo the procedure.                           After obtaining informed consent, the colonoscope                            was passed under direct vision. Throughout the                            procedure, the patient's blood pressure, pulse, and                            oxygen saturations were monitored continuously. The                            Olympus CF-HQ190L ([id number]) Colonoscope was                             introduced through the anus and advanced to the the                            terminal ileum, with identification of the                            appendiceal orifice and IC valve. The ileocecal                            valve, appendiceal orifice, and rectum were                            photographed. The quality of the bowel preparation                            was good. The colonoscopy was performed without                            difficulty. The patient tolerated the procedure                            well. Scope In: 11:34:31 AM Scope Out: 11:49:43 AM Scope Withdrawal Time: 0 hours 12 minutes 30 seconds  Total Procedure Duration: 0 hours 15 minutes 12 seconds  Findings:                 The perianal and digital rectal examinations were                            normal.                           The terminal ileum appeared normal.                           Multiple localized small non-bleeding erosions were  found in the ascending colon, in the cecum and at                            the ileocecal valve. Biopsies were taken with a                            cold forceps for histology.                           A 6 mm polyp was found in the transverse colon. The                            polyp was sessile. The polyp was removed with a                            cold snare. Resection and retrieval were complete.                           External and internal hemorrhoids were found during                            retroflexion. The hemorrhoids were small and Grade                            I (internal hemorrhoids that do not prolapse).                           The exam was otherwise without abnormality on                            direct and retroflexion views. Complications:            No immediate complications. Estimated blood loss:                            None. Estimated Blood Loss:     Estimated blood loss:  none. Impression:               - The examined portion of the terminal ileum was                            normal.                           - One 6 mm polyp in the transverse colon, removed                            with a cold snare. Resected and retrieved.                           - External and internal hemorrhoids.                           - Multiple small erosions in the ascending colon,  in the cecum and at the IC valve. Biopsied.                           - The examination was otherwise normal on direct                            and retroflexion views. Recommendation:           - Repeat colonoscopy, likely 5 years, after studies                            are complete for surveillance based on pathology                            results.                           - Patient has a contact number available for                            emergencies. The signs and symptoms of potential                            delayed complications were discussed with the                            patient. Return to normal activities tomorrow.                            Written discharge instructions were provided to the                            patient.                           - Resume previous diet.                           - Continue present medications.                           - Await pathology results. Ladene Artist, MD 08/23/2022 11:54:52 AM This report has been signed electronically.

## 2022-08-23 NOTE — Progress Notes (Signed)
See 08/16/2022 H&P, no changes

## 2022-08-23 NOTE — Patient Instructions (Signed)
Thank you for letting us take care of your healthcare needs today. Please see handouts given to you on Polyps and Hemorrhoids. You may resume your previous medications. Pathology results will be back in 1-2 weeks and Dr. Fuller Plan will notify you.     YOU HAD AN ENDOSCOPIC PROCEDURE TODAY AT [facility]:   Refer to the procedure report that was given to you for any specific questions about what was found during the examination.  If the procedure report does not answer your questions, please call your gastroenterologist to clarify.  If you requested that your care partner not be given the details of your procedure findings, then the procedure report has been included in a sealed envelope for you to review at your convenience later.  YOU SHOULD EXPECT: Some feelings of bloating in the abdomen. Passage of more gas than usual.  Walking can help get rid of the air that was put into your GI tract during the procedure and reduce the bloating. If you had a lower endoscopy (such as a colonoscopy or flexible sigmoidoscopy) you may notice spotting of blood in your stool or on the toilet paper. If you underwent a bowel prep for your procedure, you may not have a normal bowel movement for a few days.  Please Note:  You might notice some irritation and congestion in your nose or some drainage.  This is from the oxygen used during your procedure.  There is no need for concern and it should clear up in a day or so.  SYMPTOMS TO REPORT IMMEDIATELY:  Following lower endoscopy (colonoscopy or flexible sigmoidoscopy):  Excessive amounts of blood in the stool  Significant tenderness or worsening of abdominal pains  Swelling of the abdomen that is new, acute  Fever of 100F or higher   For urgent or emergent issues, a gastroenterologist can be reached at any hour by calling [phone]. Do not use MyChart messaging for urgent concerns.    DIET:  We do recommend a small meal at first, but then  you may proceed to your regular diet.  Drink plenty of fluids but you should avoid alcoholic beverages for 24 hours.  ACTIVITY:  You should plan to take it easy for the rest of today and you should NOT DRIVE or use heavy machinery until tomorrow (because of the sedation medicines used during the test).    FOLLOW UP: Our staff will call the number listed on your records the next business day following your procedure.  We will call around 7:15- 8:00 am to check on you and address any questions or concerns that you may have regarding the information given to you following your procedure. If we do not reach you, we will leave a message.     If any biopsies were taken you will be contacted by phone or by letter within the next 1-3 weeks.  Please call us at [phone] if you have not heard about the biopsies in 3 weeks.    SIGNATURES/CONFIDENTIALITY: You and/or your care partner have signed paperwork which will be entered into your electronic medical record.  These signatures attest to the fact that that the information above on your After Visit Summary has been reviewed and is understood.  Full responsibility of the confidentiality of this discharge information lies with you and/or your care-partner.

## 2022-08-24 ENCOUNTER — Encounter (HOSPITAL_BASED_OUTPATIENT_CLINIC_OR_DEPARTMENT_OTHER): Payer: Self-pay

## 2022-08-24 ENCOUNTER — Telehealth: Payer: Self-pay

## 2022-08-24 NOTE — Telephone Encounter (Signed)
  Follow up Call-     08/23/2022   10:45 AM  Call back number  Post procedure Call Back phone  # [phone]  Permission to leave phone message Yes     Patient questions:  Do you have a fever, pain , or abdominal swelling? No. Pain Score  0 *  Have you tolerated food without any problems? Yes.    Have you been able to return to your normal activities? Yes.    Do you have any questions about your discharge instructions: Diet   No. Medications  No. Follow up visit  No.  Do you have questions or concerns about your Care? No.  Actions: * If pain score is 4 or above: No action needed, pain <4.

## 2022-08-28 ENCOUNTER — Ambulatory Visit: Payer: BC Managed Care – PPO | Attending: Psychiatry | Admitting: Physical Therapy

## 2022-08-28 ENCOUNTER — Other Ambulatory Visit: Payer: Self-pay | Admitting: Family Medicine

## 2022-08-28 DIAGNOSIS — M6281 Muscle weakness (generalized): Secondary | ICD-10-CM | POA: Diagnosis not present

## 2022-08-28 DIAGNOSIS — M542 Cervicalgia: Secondary | ICD-10-CM | POA: Insufficient documentation

## 2022-08-28 DIAGNOSIS — M5412 Radiculopathy, cervical region: Secondary | ICD-10-CM | POA: Diagnosis present

## 2022-08-28 DIAGNOSIS — Z1231 Encounter for screening mammogram for malignant neoplasm of breast: Secondary | ICD-10-CM

## 2022-08-28 NOTE — Therapy (Signed)
OUTPATIENT PHYSICAL THERAPY CERVICAL EVALUATION   Patient Name: Marilyn [name] MRN: [id number] DOB:1966-03-17, 57 y.o., female Today's Date: 08/28/2022  END OF SESSION:  PT End of Session - 08/28/22 1015     Visit Number 1    Number of Visits 9   with eval   Date for PT Re-Evaluation 10/09/22    Authorization Type Blue [employer]    PT Start Time 1015    PT Stop Time 1055    PT Time Calculation (min) 40 min    Activity Tolerance Patient tolerated treatment well    Behavior During Therapy [facility] for tasks assessed/performed;Anxious             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   Aortic atherosclerosis ([place]) 02/07/2022   Arthritis    ASTHMA 01/30/2007   Asthma    Breast tumor    right; benign   Chest pain 01/24/2017   CHEST PAIN 01/29/2009   Qualifier: Diagnosis of  By: Silvio Pate MD, [facility]    Essential hypertension 07/26/2021   GERD 01/31/2007   Hemorrhoids    Hiatal hernia    Hyperlipidemia    Hypersomnolence 12/15/2012   HYPOTHYROIDISM 01/30/2007   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   Neck pain 09/09/2011   Obesity 04/11/2014   Pericardial effusion 02/07/2022   Sleep apnea    Past Surgical History:  Procedure Laterality Date   BREAST BIOPSY Right 12/2014   BREAST EXCISIONAL BIOPSY Right 2016   BREAST LUMPECTOMY WITH RADIOACTIVE SEED LOCALIZATION Right 02/25/2015   Procedure: BREAST LUMPECTOMY WITH RADIOACTIVE SEED LOCALIZATION;  Surgeon: Alphonsa Overall, MD;  Location: [facility];  Service: General;  Laterality: Right;   BREAST REDUCTION SURGERY  1986   BREAST SURGERY     broken jaw     CARPAL TUNNEL RELEASE  2001   COLONOSCOPY     FRACTURE SURGERY     REDUCTION MAMMAPLASTY Bilateral    THYROIDECTOMY     TUBAL LIGATION  1987   UPPER GASTROINTESTINAL ENDOSCOPY     Patient Active Problem List   Diagnosis Date Noted   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   Hypoxemia associated  with sleep 02/23/2022   Pericardial effusion 02/07/2022   Aortic atherosclerosis ([place]) 02/07/2022   Essential hypertension 07/26/2021   Hyperlipidemia associated with type 2 diabetes mellitus ([place]) 08/04/2020   Diet-controlled type 2 diabetes mellitus ([place]) 07/20/2019   Swollen neck 04/09/2018   Urinary retention 08/05/2017   Atypical chest pain 01/23/2017   Nausea & vomiting 07/16/2015   RUQ abdominal pain 07/16/2015   RLQ abdominal pain 07/15/2015   Pain in lower back 11/30/2014   Acute upper respiratory infection 05/05/2014   Allergic rhinitis 05/05/2014   Hoarseness 05/05/2014   Dysphagia 05/05/2014   Obesity 04/11/2014   Edema, peripheral 04/11/2014   Cough 12/20/2012   Hypersomnolence 12/15/2012   Exertional dyspnea 12/15/2012   Right shoulder pain 12/15/2012   Abdominal pain 11/15/2011   Diabetes mellitus with coincident hypertension ([place]) 09/09/2011   Neck pain 09/09/2011   Preventative health care 04/30/2011   HOT FLASHES 12/22/2009   CHEST PAIN 01/29/2009   Acute non-recurrent maxillary sinusitis 09/25/2008   HYPERTHYROIDISM 01/31/2007   GERD 01/31/2007   Hypothyroidism 01/30/2007   Hyperlipidemia 01/30/2007   Iron deficiency anemia 01/30/2007   ASTHMA 01/30/2007    PCP: Billie Ruddy, MD  REFERRING PROVIDER: Genia Harold, MD  REFERRING DIAG: M54.2 (ICD-10-CM) - Cervicalgia  THERAPY  DIAG:  Muscle weakness (generalized)  Cervicalgia  Radiculopathy, cervical region  Rationale for Evaluation and Treatment: Rehabilitation  ONSET DATE: 08/03/2022  SUBJECTIVE:                                                                                                                                                                                                         SUBJECTIVE STATEMENT: Pt is unsure what specifically she has been referred to PT for. Pt reports having issues with her throat and with swallowing lately, had an asthma attack after using  mouthwash (possible aspiration?). Pt also reports that she has pain had radiating pain down her R arm but that has improved lately, worried about ongoing swelling in her L bicep for the past year. Pt reports having chest pain and pain down her LUE last year and went to ED, was told it was not a heart attack and has residual swelling in L bicep since that incident. Pt also endorses having bad headaches for the past month, she woke up and couldn't open her eyes without feeling extreme pain so went to the ED for that. Pt has these headaches about 1-2 times per week, takes Tylenol and tries to rest when headaches start. Pt also reports that recently she has noticed more short-term memory issues, can be driving and will forget where she is going. Pt very emotional about this and has not mentioned this to her family yet. Pt also notes that she has had vision changes over the past week, has increased swelling in the R side of her face, and has a history of intermittent dizziness that she does take medication to control. Pt reports she monitors her BP closely at home, most recent readings were 127/89; 125/80.  Hand dominance: Right  PERTINENT HISTORY:  DM, HTN, HLD, CAD, hypothyroidism  Per Dr. Billey Gosling note 08/03/2022: The patient presents for evaluation of headaches which began one month ago. They are associated with photophobia, no phonophobia or nausea. Headaches are described as right sided pressure and shooting pain which radiates down her neck. She does report tension and stiffness of the neck as well. Pain will occasionally radiate down her arm and she has noticed decreased grip strength on the right. Headaches can last from 20 minutes to 2 hours at a time. She is currently averaging 5 headaches per month. She takes Tylenol or Flexeril as needed which does help.  PAIN:  Are you having pain? No  PRECAUTIONS: None  WEIGHT BEARING RESTRICTIONS: No  FALLS:  Has patient fallen in last 6 months? 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 Billey Gosling  OBJECTIVE:   DIAGNOSTIC FINDINGS:  Cervical Spine MRI 08/09/2022 IMPRESSION: MRI scan of cervical spine without contrast showing prominent disc and endplate spondylitic changes at C6/7 but without definite compression.  PATIENT SURVEYS:  Headache Disability Index: 26  COGNITION: Overall cognitive status: Within functional limits for tasks assessed; pt does report some issues with short-term memory more recently  SENSATION: Occasional N/T into R hand  POSTURE: rounded shoulders and forward head  PALPATION: TTP R suboccipitals and L/R upper traps  CERVICAL ROM:   Active ROM A/PROM (deg) Eval 08/28/2022  Flexion 40 (pain in sternum)  Extension 25 (pain in back of neck)  Right lateral flexion 30 (Pain in R side of neck)  Left lateral flexion 20 (Pain in R side of neck)  Right rotation 38  Left rotation 44 (Pain in R side of neck)   (Blank rows = not tested)  UPPER EXTREMITY ROM:  Active ROM Right eval Left eval  Shoulder flexion WFL* WFL*  Shoulder extension    Shoulder abduction WFL* WFL*  Shoulder adduction    Shoulder extension    Shoulder internal rotation    Shoulder external rotation    Elbow flexion    Elbow extension    Wrist flexion    Wrist extension    Wrist ulnar deviation    Wrist radial deviation    Wrist pronation    Wrist supination     (Blank rows = not tested)  *pain in mid-low back at end-range  UPPER EXTREMITY MMT:  MMT Right eval Left eval  Shoulder flexion 5 5  Shoulder extension    Shoulder abduction 5 5   Shoulder adduction    Shoulder extension    Shoulder internal rotation    Shoulder external rotation    Middle trapezius    Lower trapezius    Elbow flexion 4+ 4+  Elbow extension 4+ 4+  Wrist flexion    Wrist extension    Wrist ulnar deviation    Wrist radial deviation    Wrist pronation    Wrist supination    Grip strength Decreased, not formally assessed Decreased, not formally assessed   (Blank rows = not tested)  CERVICAL SPECIAL TESTS:  Upper limb tension test (ULTT): Positive (for median, ulnar, and radial nerve)   TODAY'S TREATMENT:                                                                                                                              PT Evaluation  PATIENT EDUCATION:  Education details: Eval findings, PT POC Person  educated: Patient Education method: Customer service manager Education comprehension: verbalized understanding and needs further education  HOME EXERCISE PROGRAM: To be initiated  ASSESSMENT:  CLINICAL IMPRESSION: Patient is a 57 year old female referred to Neuro OPPT for cervicalgia.   Pt's PMH is significant for: DM, HTN, HLD, CAD, hypothyroidism. The following deficits were present during the exam: decreased cervical ROM, positive neural tension testing, increased pain. Pt would benefit from skilled PT to address these impairments and functional limitations to maximize functional mobility independence   OBJECTIVE IMPAIRMENTS: decreased activity tolerance, decreased knowledge of condition, decreased ROM, decreased strength, dizziness, impaired perceived functional ability, impaired sensation, impaired UE functional use, postural dysfunction, and pain.   ACTIVITY LIMITATIONS: carrying and lifting  PARTICIPATION LIMITATIONS: meal prep, interpersonal relationship, driving, community activity, and occupation  PERSONAL FACTORS: 1-2 comorbidities:    DM, HTN, HLD, CAD, hypothyroidismare also affecting patient's functional  outcome.   REHAB POTENTIAL: Good  CLINICAL DECISION MAKING: Stable/uncomplicated  EVALUATION COMPLEXITY: Moderate   GOALS: Goals reviewed with patient? Yes  SHORT TERM GOALS=LONG TERM GOALS due to length of POC  LONG TERM GOALS: Target date: 09/28/2022  Pt will be independent with final HEP for improved strength, balance, transfers and gait. Baseline: not established at eval Goal status: INITIAL  2.  Pt will improve cervical ROM by 10 degrees in all planes to demonstrate improved overall function Baseline: see below Active ROM A/PROM (deg) Eval 08/28/2022  Flexion 40 (pain in sternum)  Extension 25 (pain in back of neck)  Right lateral flexion 30 (Pain in R side of neck)  Left lateral flexion 20 (Pain in R side of neck)  Right rotation 38  Left rotation 44 (Pain in R side of neck)   Goal status: INITIAL  3.  Pt will decrease score on Headache Disability Index to 20 points to demonstrate decreased pain and improved function Baseline: 26 Goal status: INITIAL   PLAN:  PT FREQUENCY: 2x/week  PT DURATION: 4 weeks  PLANNED INTERVENTIONS: Therapeutic exercises, Therapeutic activity, Neuromuscular re-education, Balance training, Gait training, Patient/Family education, Self Care, Joint mobilization, Vestibular training, Canalith repositioning, Visual/preceptual remediation/compensation, DME instructions, Aquatic Therapy, Dry Needling, Electrical stimulation, Spinal manipulation, Spinal mobilization, Cryotherapy, Moist heat, Taping, Traction, Manual therapy, and Re-evaluation  PLAN FOR NEXT SESSION: did we get SLP and OT referrals? Did pt get more imaging scheduled?, initiate HEP for cervicalgia: suboccipital release, UT and levator stretches, chin tucks, cervical retraction, postural stability, thoracic mobilization stretch, TPDN to suboccipitals and UT   Excell Seltzer, PT, DPT, CSRS 08/28/2022, 10:56 AM

## 2022-08-29 ENCOUNTER — Telehealth: Payer: Self-pay | Admitting: Physical Therapy

## 2022-08-29 DIAGNOSIS — H547 Unspecified visual loss: Secondary | ICD-10-CM

## 2022-08-29 DIAGNOSIS — R1310 Dysphagia, unspecified: Secondary | ICD-10-CM

## 2022-08-29 DIAGNOSIS — R29898 Other symptoms and signs involving the musculoskeletal system: Secondary | ICD-10-CM

## 2022-08-29 NOTE — Telephone Encounter (Signed)
Dr. Billey Gosling,  Marilyn [name] was evaluated by Physical Therapy on 08/28/22 for her occipital neuralgia. The patient also reports new onset of swelling in the R side of her face (same side as her neck/headache pain) as well as vision changes. Would you be agreeable to order further imaging due to these additional symptoms she is experiencing?  Additionally, the patient would benefit from an Occupational Therapy evaluation for RUE weakness and decreased grip strength and a Speech Therapy evaluation for difficulty swallowing and occasional instances of aspiration.    If you agree, please place an order in [facility] workque in [facility] or fax the order to [phone]. Thank you, Excell Seltzer, PT, DPT, [facility] [address] [place], [place]  [address] Phone:  [phone] Fax:  [phone]

## 2022-08-30 ENCOUNTER — Ambulatory Visit
Admission: RE | Admit: 2022-08-30 | Discharge: 2022-08-30 | Disposition: A | Discharge status: Home / Self Care | Payer: BC Managed Care – PPO | Source: Ambulatory Visit | Attending: Family Medicine | Admitting: Family Medicine

## 2022-08-30 DIAGNOSIS — Z1231 Encounter for screening mammogram for malignant neoplasm of breast: Secondary | ICD-10-CM

## 2022-09-05 ENCOUNTER — Ambulatory Visit: Payer: BC Managed Care – PPO | Attending: Psychiatry

## 2022-09-05 DIAGNOSIS — M542 Cervicalgia: Secondary | ICD-10-CM | POA: Diagnosis present

## 2022-09-05 DIAGNOSIS — M6281 Muscle weakness (generalized): Secondary | ICD-10-CM | POA: Insufficient documentation

## 2022-09-05 DIAGNOSIS — M5412 Radiculopathy, cervical region: Secondary | ICD-10-CM | POA: Diagnosis present

## 2022-09-05 DIAGNOSIS — R490 Dysphonia: Secondary | ICD-10-CM | POA: Diagnosis present

## 2022-09-05 DIAGNOSIS — M79642 Pain in left hand: Secondary | ICD-10-CM | POA: Diagnosis present

## 2022-09-05 DIAGNOSIS — R1310 Dysphagia, unspecified: Secondary | ICD-10-CM | POA: Diagnosis present

## 2022-09-05 DIAGNOSIS — M79641 Pain in right hand: Secondary | ICD-10-CM | POA: Insufficient documentation

## 2022-09-05 DIAGNOSIS — R202 Paresthesia of skin: Secondary | ICD-10-CM | POA: Diagnosis present

## 2022-09-05 NOTE — Therapy (Signed)
OUTPATIENT PHYSICAL THERAPY CERVICAL TREATMENT   Patient Name: Marilyn [name] MRN: [id number] DOB:[birth year], 57 y.o., female Today's Date: 09/05/2022  END OF SESSION:  PT End of Session - 09/05/22 1517     Visit Number 2    Number of Visits 9    Date for PT Re-Evaluation 10/09/22    Authorization Type Blue [employer] Blue [employer]    PT Start Time 1520    PT Stop Time 1605    PT Time Calculation (min) 45 min    Activity Tolerance Patient tolerated treatment well    Behavior During Therapy [facility] for tasks assessed/performed             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   Aortic atherosclerosis 02/07/2022   Arthritis    ASTHMA 01/30/2007   Asthma    Breast tumor    right; benign   Chest pain 01/24/2017   CHEST PAIN 01/29/2009   Qualifier: Diagnosis of  By: Silvio Pate MD, [facility]    Essential hypertension 07/26/2021   GERD 01/31/2007   Hemorrhoids    Hiatal hernia    Hyperlipidemia    Hypersomnolence 12/15/2012   HYPOTHYROIDISM 01/30/2007   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   Neck pain 09/09/2011   Obesity 04/11/2014   Pericardial effusion 02/07/2022   Sleep apnea    Past Surgical History:  Procedure Laterality Date   BREAST BIOPSY Right 12/2014   BREAST EXCISIONAL BIOPSY Right 2016   BREAST LUMPECTOMY WITH RADIOACTIVE SEED LOCALIZATION Right 02/25/2015   Procedure: BREAST LUMPECTOMY WITH RADIOACTIVE SEED LOCALIZATION;  Surgeon: Alphonsa Overall, MD;  Location: [facility];  Service: General;  Laterality: Right;   BREAST REDUCTION SURGERY  1986   BREAST SURGERY     broken jaw     CARPAL TUNNEL RELEASE  2001   COLONOSCOPY     FRACTURE SURGERY     REDUCTION MAMMAPLASTY Bilateral    THYROIDECTOMY     TUBAL LIGATION  1987   UPPER GASTROINTESTINAL ENDOSCOPY     Patient Active Problem List   Diagnosis Date Noted   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   Hypoxemia associated with sleep 02/23/2022    Pericardial effusion 02/07/2022   Aortic atherosclerosis 02/07/2022   Essential hypertension 07/26/2021   Hyperlipidemia associated with type 2 diabetes mellitus 08/04/2020   Diet-controlled type 2 diabetes mellitus 07/20/2019   Swollen neck 04/09/2018   Urinary retention 08/05/2017   Atypical chest pain 01/23/2017   Nausea & vomiting 07/16/2015   RUQ abdominal pain 07/16/2015   RLQ abdominal pain 07/15/2015   Pain in lower back 11/30/2014   Acute upper respiratory infection 05/05/2014   Allergic rhinitis 05/05/2014   Hoarseness 05/05/2014   Dysphagia 05/05/2014   Obesity 04/11/2014   Edema, peripheral 04/11/2014   Cough 12/20/2012   Hypersomnolence 12/15/2012   Exertional dyspnea 12/15/2012   Right shoulder pain 12/15/2012   Abdominal pain 11/15/2011   Diabetes mellitus with coincident hypertension 09/09/2011   Neck pain 09/09/2011   Preventative health care 04/30/2011   HOT FLASHES 12/22/2009   CHEST PAIN 01/29/2009   Acute non-recurrent maxillary sinusitis 09/25/2008   HYPERTHYROIDISM 01/31/2007   GERD 01/31/2007   Hypothyroidism 01/30/2007   Hyperlipidemia 01/30/2007   Iron deficiency anemia 01/30/2007   ASTHMA 01/30/2007    PCP: Billie Ruddy, MD  REFERRING PROVIDER: Genia Harold, MD  REFERRING DIAG: M54.2 (ICD-10-CM) - Cervicalgia  THERAPY DIAG:  Muscle weakness (generalized)  Cervicalgia  Rationale for Evaluation and Treatment: Rehabilitation  ONSET DATE: 08/03/2022  SUBJECTIVE:                                                                                                                                                                                                         SUBJECTIVE STATEMENT: Patient reports doing fair. Has had 4 rather significant headaches so far this week. All in different areas it seems. Does have increased welling in L bicep and R anterior shin. Denies falls/near falls.   Hand dominance: Right  PERTINENT HISTORY:  DM,  HTN, HLD, CAD, hypothyroidism  Per Dr. Billey Gosling note 08/03/2022: The patient presents for evaluation of headaches which began one month ago. They are associated with photophobia, no phonophobia or nausea. Headaches are described as right sided pressure and shooting pain which radiates down her neck. She does report tension and stiffness of the neck as well. Pain will occasionally radiate down her arm and she has noticed decreased grip strength on the right. Headaches can last from 20 minutes to 2 hours at a time. She is currently averaging 5 headaches per month. She takes Tylenol or Flexeril as needed which does help.  PAIN:  Are you having pain? No  BP: 125/75  PRECAUTIONS: None  PATIENT GOALS: "improve overall function"   TODAY'S TREATMENT:                                                                                                                              -HEP (see below)  -x5 mins theracane to reduce upper trap trigger points independently  -extensive education on keeping HA diary and various potential causes for her symptoms    PATIENT EDUCATION:  Education details: initial HEP, see above Person educated: Patient Education method: Customer service manager Education comprehension: verbalized understanding and needs further education  HOME EXERCISE PROGRAM: Access Code: [email or web address] URL: [email or web address] Date: 09/05/2022 Prepared by: Estevan Ryder  Exercises - Seated Scapular Retraction  - 1 x daily - 7 x weekly - 3  sets - 10 reps - Seated Upper Trapezius Stretch  - 1 x daily - 7 x weekly - 3 sets - 30 hold - Seated Levator Scapulae Stretch  - 1 x daily - 7 x weekly - 3 sets - 30 hold - Seated Cervical Flexion Stretch with Finger Support Behind Neck  - 1 x daily - 7 x weekly - 3 sets - 30 hold - Seated Thoracic Self Mobilization  - 1 x daily - 7 x weekly - 3 sets - 30 hold  ASSESSMENT:  CLINICAL IMPRESSION: Patient seen for skilled PT services with  emphasis on initiating HEP for cervicalgia. She does have significant tone in B upper traps, L >R with noted L shoulder elevated as well. Tolerated theracane well and PT provided patient with information on where to purchase one and how to use. She describes a lump-like feeling in her throat that makes it difficult to swallow large boluses of food or cough up secretions when sick. PT advising patient to see an ENT for further evaluation of this. PT also questioning hormonal involvement for recurrent HA as patient had a thyroidectomy and is now peri/post menopausal. Continue POC.    OBJECTIVE IMPAIRMENTS: decreased activity tolerance, decreased knowledge of condition, decreased ROM, decreased strength, dizziness, impaired perceived functional ability, impaired sensation, impaired UE functional use, postural dysfunction, and pain.   ACTIVITY LIMITATIONS: carrying and lifting  PARTICIPATION LIMITATIONS: meal prep, interpersonal relationship, driving, community activity, and occupation  PERSONAL FACTORS: 1-2 comorbidities:    DM, HTN, HLD, CAD, hypothyroidismare also affecting patient's functional outcome.   REHAB POTENTIAL: Good  CLINICAL DECISION MAKING: Stable/uncomplicated  EVALUATION COMPLEXITY: Moderate   GOALS: Goals reviewed with patient? Yes  SHORT TERM GOALS=LONG TERM GOALS due to length of POC  LONG TERM GOALS: Target date: 09/28/2022  Pt will be independent with final HEP for improved strength, balance, transfers and gait. Baseline: not established at eval Goal status: INITIAL  2.  Pt will improve cervical ROM by 10 degrees in all planes to demonstrate improved overall function Baseline: see below Active ROM A/PROM (deg) Eval 08/28/2022  Flexion 40 (pain in sternum)  Extension 25 (pain in back of neck)  Right lateral flexion 30 (Pain in R side of neck)  Left lateral flexion 20 (Pain in R side of neck)  Right rotation 38  Left rotation 44 (Pain in R side of neck)   Goal  status: INITIAL  3.  Pt will decrease score on Headache Disability Index to 20 points to demonstrate decreased pain and improved function Baseline: 26 Goal status: INITIAL   PLAN:  PT FREQUENCY: 2x/week  PT DURATION: 4 weeks  PLANNED INTERVENTIONS: Therapeutic exercises, Therapeutic activity, Neuromuscular re-education, Balance training, Gait training, Patient/Family education, Self Care, Joint mobilization, Vestibular training, Canalith repositioning, Visual/preceptual remediation/compensation, DME instructions, Aquatic Therapy, Dry Needling, Electrical stimulation, Spinal manipulation, Spinal mobilization, Cryotherapy, Moist heat, Taping, Traction, Manual therapy, and Re-evaluation  PLAN FOR NEXT SESSION: did we get SLP and OT referrals? Did pt get more imaging scheduled?, initiate HEP for cervicalgia: suboccipital release, UT and levator stretches, chin tucks, cervical retraction, postural stability, thoracic mobilization stretch, TPDN to suboccipitals and UT   Debbora Dus, PT, DPT, CBIS 09/05/2022, 4:10 PM

## 2022-09-06 ENCOUNTER — Ambulatory Visit: Payer: BC Managed Care – PPO | Admitting: Cardiology

## 2022-09-07 ENCOUNTER — Ambulatory Visit: Payer: BC Managed Care – PPO

## 2022-09-07 DIAGNOSIS — M542 Cervicalgia: Secondary | ICD-10-CM

## 2022-09-07 DIAGNOSIS — M6281 Muscle weakness (generalized): Secondary | ICD-10-CM | POA: Diagnosis not present

## 2022-09-07 NOTE — Therapy (Signed)
OUTPATIENT PHYSICAL THERAPY CERVICAL TREATMENT   Patient Name: Marilyn [name] MRN: [id number] DOB:Oct 14, 1965, 57 y.o., female Today's Date: 09/07/2022  END OF SESSION:  PT End of Session - 09/07/22 1454     Visit Number 3    Number of Visits 9    Date for PT Re-Evaluation 10/09/22    Authorization Type Blue Cross Blue Shield    PT Start Time 1452   PT running over from previous session   PT Stop Time 1534    PT Time Calculation (min) 42 min    Activity Tolerance Patient tolerated treatment well    Behavior During Therapy [facility] for tasks assessed/performed             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   Aortic atherosclerosis 02/07/2022   Arthritis    ASTHMA 01/30/2007   Asthma    Breast tumor    right; benign   Chest pain 01/24/2017   CHEST PAIN 01/29/2009   Qualifier: Diagnosis of  By: Alphonsus Sias MD, Ronnette Hila    Essential hypertension 07/26/2021   GERD 01/31/2007   Hemorrhoids    Hiatal hernia    Hyperlipidemia    Hypersomnolence 12/15/2012   HYPOTHYROIDISM 01/30/2007   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   Neck pain 09/09/2011   Obesity 04/11/2014   Pericardial effusion 02/07/2022   Sleep apnea    Past Surgical History:  Procedure Laterality Date   BREAST BIOPSY Right 12/2014   BREAST EXCISIONAL BIOPSY Right 2016   BREAST LUMPECTOMY WITH RADIOACTIVE SEED LOCALIZATION Right 02/25/2015   Procedure: BREAST LUMPECTOMY WITH RADIOACTIVE SEED LOCALIZATION;  Surgeon: Ovidio Kin, MD;  Location: [facility];  Service: General;  Laterality: Right;   BREAST REDUCTION SURGERY  1986   BREAST SURGERY     broken jaw     CARPAL TUNNEL RELEASE  2001   COLONOSCOPY     FRACTURE SURGERY     REDUCTION MAMMAPLASTY Bilateral    THYROIDECTOMY     TUBAL LIGATION  1987   UPPER GASTROINTESTINAL ENDOSCOPY     Patient Active Problem List   Diagnosis Date Noted   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    Hypoxemia associated with sleep 02/23/2022   Pericardial effusion 02/07/2022   Aortic atherosclerosis 02/07/2022   Essential hypertension 07/26/2021   Hyperlipidemia associated with type 2 diabetes mellitus 08/04/2020   Diet-controlled type 2 diabetes mellitus 07/20/2019   Swollen neck 04/09/2018   Urinary retention 08/05/2017   Atypical chest pain 01/23/2017   Nausea & vomiting 07/16/2015   RUQ abdominal pain 07/16/2015   RLQ abdominal pain 07/15/2015   Pain in lower back 11/30/2014   Acute upper respiratory infection 05/05/2014   Allergic rhinitis 05/05/2014   Hoarseness 05/05/2014   Dysphagia 05/05/2014   Obesity 04/11/2014   Edema, peripheral 04/11/2014   Cough 12/20/2012   Hypersomnolence 12/15/2012   Exertional dyspnea 12/15/2012   Right shoulder pain 12/15/2012   Abdominal pain 11/15/2011   Diabetes mellitus with coincident hypertension 09/09/2011   Neck pain 09/09/2011   Preventative health care 04/30/2011   HOT FLASHES 12/22/2009   CHEST PAIN 01/29/2009   Acute non-recurrent maxillary sinusitis 09/25/2008   HYPERTHYROIDISM 01/31/2007   GERD 01/31/2007   Hypothyroidism 01/30/2007   Hyperlipidemia 01/30/2007   Iron deficiency anemia 01/30/2007   ASTHMA 01/30/2007    PCP: Deeann Saint, MD  REFERRING PROVIDER: Ocie Doyne, MD  REFERRING DIAG: M54.2 (ICD-10-CM) - Cervicalgia  THERAPY DIAG:  Muscle weakness (generalized)  Cervicalgia  Rationale for Evaluation and Treatment: Rehabilitation  ONSET DATE: 08/03/2022  SUBJECTIVE:                                                                                                                                                                                                         SUBJECTIVE STATEMENT: Patient reports doing well. Does have some L LQ abdominal pain today, but states it may be related to what she had for lunch. She had a very stressful day at work yesterday and thus had a "splitting headache."  Denies falls/near falls.   Hand dominance: Right  PERTINENT HISTORY:  DM, HTN, HLD, CAD, hypothyroidism  Per Dr. Delena Balihima note 08/03/2022: The patient presents for evaluation of headaches which began one month ago. They are associated with photophobia, no phonophobia or nausea. Headaches are described as right sided pressure and shooting pain which radiates down her neck. She does report tension and stiffness of the neck as well. Pain will occasionally radiate down her arm and she has noticed decreased grip strength on the right. Headaches can last from 20 minutes to 2 hours at a time. She is currently averaging 5 headaches per month. She takes Tylenol or Flexeril as needed which does help.  PAIN:  Are you having pain? No  BP: 125/75  PRECAUTIONS: None  PATIENT GOALS: "improve overall function"   TODAY'S TREATMENT:                                                                                                                              -extensive conversation on stress HA vs other etiologies  - supine cervical retraction  -supine chin tuck  -supine scap retraction  -anterior ball roll out -lateral ball roll out  -theracane x5 mins for TP release   PATIENT EDUCATION:  Education details: continue HEP Person educated: Patient Education method: Medical illustratorxplanation and Demonstration Education comprehension: verbalized understanding and needs further education  HOME EXERCISE PROGRAM: Access Code: [email or web address] URL: [email or web address] Date: 09/05/2022 Prepared  by: Merry LoftyJennifer Antoine Fiallos  Exercises - Seated Scapular Retraction  - 1 x daily - 7 x weekly - 3 sets - 10 reps - Seated Upper Trapezius Stretch  - 1 x daily - 7 x weekly - 3 sets - 30 hold - Seated Levator Scapulae Stretch  - 1 x daily - 7 x weekly - 3 sets - 30 hold - Seated Cervical Flexion Stretch with Finger Support Behind Neck  - 1 x daily - 7 x weekly - 3 sets - 30 hold - Seated Thoracic Self Mobilization  - 1 x daily -  7 x weekly - 3 sets - 30 hold  ASSESSMENT:  CLINICAL IMPRESSION: Patient seen for skilled PT services with emphasis on cervicalgia relief. Patient tolerating exercises well with some relief to sub occipital tone. Discussed seeing if there's a subjective difference in stress-mediated HA vs tension HA. Continue POC.    OBJECTIVE IMPAIRMENTS: decreased activity tolerance, decreased knowledge of condition, decreased ROM, decreased strength, dizziness, impaired perceived functional ability, impaired sensation, impaired UE functional use, postural dysfunction, and pain.   ACTIVITY LIMITATIONS: carrying and lifting  PARTICIPATION LIMITATIONS: meal prep, interpersonal relationship, driving, community activity, and occupation  PERSONAL FACTORS: 1-2 comorbidities:    DM, HTN, HLD, CAD, hypothyroidismare also affecting patient's functional outcome.   REHAB POTENTIAL: Good  CLINICAL DECISION MAKING: Stable/uncomplicated  EVALUATION COMPLEXITY: Moderate   GOALS: Goals reviewed with patient? Yes  SHORT TERM GOALS=LONG TERM GOALS due to length of POC  LONG TERM GOALS: Target date: 09/28/2022  Pt will be independent with final HEP for improved strength, balance, transfers and gait. Baseline: not established at eval Goal status: INITIAL  2.  Pt will improve cervical ROM by 10 degrees in all planes to demonstrate improved overall function Baseline: see below Active ROM A/PROM (deg) Eval 08/28/2022  Flexion 40 (pain in sternum)  Extension 25 (pain in back of neck)  Right lateral flexion 30 (Pain in R side of neck)  Left lateral flexion 20 (Pain in R side of neck)  Right rotation 38  Left rotation 44 (Pain in R side of neck)   Goal status: INITIAL  3.  Pt will decrease score on Headache Disability Index to 20 points to demonstrate decreased pain and improved function Baseline: 26 Goal status: INITIAL   PLAN:  PT FREQUENCY: 2x/week  PT DURATION: 4 weeks  PLANNED INTERVENTIONS:  Therapeutic exercises, Therapeutic activity, Neuromuscular re-education, Balance training, Gait training, Patient/Family education, Self Care, Joint mobilization, Vestibular training, Canalith repositioning, Visual/preceptual remediation/compensation, DME instructions, Aquatic Therapy, Dry Needling, Electrical stimulation, Spinal manipulation, Spinal mobilization, Cryotherapy, Moist heat, Taping, Traction, Manual therapy, and Re-evaluation  PLAN FOR NEXT SESSION:  suboccipital release, UT and levator stretches, chin tucks, cervical retraction, postural stability, thoracic mobilization stretch, TPDN to suboccipitals and UT   Westley FootsJennifer A Ilya Ess, PT, DPT, CBIS 09/07/2022, 3:40 PM

## 2022-09-12 ENCOUNTER — Ambulatory Visit: Payer: BC Managed Care – PPO

## 2022-09-12 DIAGNOSIS — M6281 Muscle weakness (generalized): Secondary | ICD-10-CM | POA: Diagnosis not present

## 2022-09-12 DIAGNOSIS — M542 Cervicalgia: Secondary | ICD-10-CM

## 2022-09-12 NOTE — Therapy (Unsigned)
OUTPATIENT PHYSICAL THERAPY CERVICAL TREATMENT   Patient Name: Marilyn [name] MRN: [id number] DOB:[birth year], 57 y.o., female Today's Date: 09/12/2022  END OF SESSION:  PT End of Session - 09/12/22 1440     Visit Number 4    Number of Visits 9    Date for PT Re-Evaluation 10/09/22    Authorization Type [employer] [place]    PT Start Time [phone]    PT Stop Time 1612    PT Time Calculation (min) 40 min    Activity Tolerance Patient tolerated treatment well    Behavior During Therapy [facility] for tasks assessed/performed             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   Aortic atherosclerosis 02/07/2022   Arthritis    ASTHMA 01/30/2007   Asthma    Breast tumor    right; benign   Chest pain 01/24/2017   CHEST PAIN 01/29/2009   Qualifier: Diagnosis of  By: Alphonsus Sias MD, Ronnette Hila    Essential hypertension 07/26/2021   GERD 01/31/2007   Hemorrhoids    Hiatal hernia    Hyperlipidemia    Hypersomnolence 12/15/2012   HYPOTHYROIDISM 01/30/2007   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   Neck pain 09/09/2011   Obesity 04/11/2014   Pericardial effusion 02/07/2022   Sleep apnea    Past Surgical History:  Procedure Laterality Date   BREAST BIOPSY Right 12/2014   BREAST EXCISIONAL BIOPSY Right 2016   BREAST LUMPECTOMY WITH RADIOACTIVE SEED LOCALIZATION Right 02/25/2015   Procedure: BREAST LUMPECTOMY WITH RADIOACTIVE SEED LOCALIZATION;  Surgeon: Ovidio Kin, MD;  Location: [facility];  Service: General;  Laterality: Right;   BREAST REDUCTION SURGERY  1986   BREAST SURGERY     broken jaw     CARPAL TUNNEL RELEASE  2001   COLONOSCOPY     FRACTURE SURGERY     REDUCTION MAMMAPLASTY Bilateral    THYROIDECTOMY     TUBAL LIGATION  1987   UPPER GASTROINTESTINAL ENDOSCOPY     Patient Active Problem List   Diagnosis Date Noted   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   Hypoxemia associated with sleep 02/23/2022    Pericardial effusion 02/07/2022   Aortic atherosclerosis 02/07/2022   Essential hypertension 07/26/2021   Hyperlipidemia associated with type 2 diabetes mellitus 08/04/2020   Diet-controlled type 2 diabetes mellitus 07/20/2019   Swollen neck 04/09/2018   Urinary retention 08/05/2017   Atypical chest pain 01/23/2017   Nausea & vomiting 07/16/2015   RUQ abdominal pain 07/16/2015   RLQ abdominal pain 07/15/2015   Pain in lower back 11/30/2014   Acute upper respiratory infection 05/05/2014   Allergic rhinitis 05/05/2014   Hoarseness 05/05/2014   Dysphagia 05/05/2014   Obesity 04/11/2014   Edema, peripheral 04/11/2014   Cough 12/20/2012   Hypersomnolence 12/15/2012   Exertional dyspnea 12/15/2012   Right shoulder pain 12/15/2012   Abdominal pain 11/15/2011   Diabetes mellitus with coincident hypertension 09/09/2011   Neck pain 09/09/2011   Preventative health care 04/30/2011   HOT FLASHES 12/22/2009   CHEST PAIN 01/29/2009   Acute non-recurrent maxillary sinusitis 09/25/2008   HYPERTHYROIDISM 01/31/2007   GERD 01/31/2007   Hypothyroidism 01/30/2007   Hyperlipidemia 01/30/2007   Iron deficiency anemia 01/30/2007   ASTHMA 01/30/2007    PCP: Deeann Saint, MD  REFERRING PROVIDER: Ocie Doyne, MD  REFERRING DIAG: M54.2 (ICD-10-CM) - Cervicalgia  THERAPY DIAG:  Cervicalgia  Muscle weakness (generalized)  Rationale for Evaluation and Treatment: Rehabilitation  ONSET DATE: 08/03/2022  SUBJECTIVE:                                                                                                                                                                                                         SUBJECTIVE STATEMENT: Patient reports doing well. Had 1 HA since last visit, but think it's because she didn't eat on time. Has not had much neck pain. Complains most about R hand pain. Has not been contacted regarding imaging yet.   Hand dominance: Right  PERTINENT HISTORY:   DM, HTN, HLD, CAD, hypothyroidism  Per Dr. Delena Bali note 08/03/2022: The patient presents for evaluation of headaches which began one month ago. They are associated with photophobia, no phonophobia or nausea. Headaches are described as right sided pressure and shooting pain which radiates down her neck. She does report tension and stiffness of the neck as well. Pain will occasionally radiate down her arm and she has noticed decreased grip strength on the right. Headaches can last from 20 minutes to 2 hours at a time. She is currently averaging 5 headaches per month. She takes Tylenol or Flexeril as needed which does help.  PAIN:  Are you having pain? No  BP: 125/75  PRECAUTIONS: None  PATIENT GOALS: "improve overall function"   TODAY'S TREATMENT:                                                                                                                              -reviewed HEP stretches below -anterior ball roll out-> on an angle for mid/low back stretch  -assessment of suboccipital tone (none noted and no provocation of pain)  -shoulder/scapular stability ball circles on wall  -attempted wall angels, unable to complete and so did supine   PATIENT EDUCATION:  Education details: continue HEP Person educated: Patient Education method: Explanation and Demonstration Education comprehension: verbalized understanding and needs further education  HOME EXERCISE PROGRAM: Access Code: [email or web address] URL: [email or web address] Date: 09/05/2022 Prepared by: Victorino Dike  Kellar Westberg  Exercises - Seated Scapular Retraction  - 1 x daily - 7 x weekly - 3 sets - 10 reps - Seated Upper Trapezius Stretch  - 1 x daily - 7 x weekly - 3 sets - 30 hold - Seated Levator Scapulae Stretch  - 1 x daily - 7 x weekly - 3 sets - 30 hold - Seated Cervical Flexion Stretch with Finger Support Behind Neck  - 1 x daily - 7 x weekly - 3 sets - 30 hold - Seated Thoracic Self Mobilization  - 1 x daily - 7 x  weekly - 3 sets - 30 hold - Single Arm Wall Angel  - 1 x daily - 7 x weekly - 3 sets - 10 reps  ASSESSMENT:  CLINICAL IMPRESSION: Patient seen for skilled PT session with emphasis on postural retraining and proximal stability. She reports that her vision is blurry, but feels as though there's a film over her eyes at times that she has to blink/rub away. Patient has an apt with eye dr at some point this month. Difficulty isolating distal UE movements without engaging upper traps. However, on palpation, no increased tone in B upper traps or suboccipitals noted and no recreation of pain. Continue POC.    OBJECTIVE IMPAIRMENTS: decreased activity tolerance, decreased knowledge of condition, decreased ROM, decreased strength, dizziness, impaired perceived functional ability, impaired sensation, impaired UE functional use, postural dysfunction, and pain.   ACTIVITY LIMITATIONS: carrying and lifting  PARTICIPATION LIMITATIONS: meal prep, interpersonal relationship, driving, community activity, and occupation  PERSONAL FACTORS: 1-2 comorbidities:    DM, HTN, HLD, CAD, hypothyroidismare also affecting patient's functional outcome.   REHAB POTENTIAL: Good  CLINICAL DECISION MAKING: Stable/uncomplicated  EVALUATION COMPLEXITY: Moderate   GOALS: Goals reviewed with patient? Yes  SHORT TERM GOALS=LONG TERM GOALS due to length of POC  LONG TERM GOALS: Target date: 09/28/2022  Pt will be independent with final HEP for improved strength, balance, transfers and gait. Baseline: not established at eval Goal status: INITIAL  2.  Pt will improve cervical ROM by 10 degrees in all planes to demonstrate improved overall function Baseline: see below Active ROM A/PROM (deg) Eval 08/28/2022  Flexion 40 (pain in sternum)  Extension 25 (pain in back of neck)  Right lateral flexion 30 (Pain in R side of neck)  Left lateral flexion 20 (Pain in R side of neck)  Right rotation 38  Left rotation 44 (Pain in  R side of neck)   Goal status: INITIAL  3.  Pt will decrease score on Headache Disability Index to 20 points to demonstrate decreased pain and improved function Baseline: 26 Goal status: INITIAL   PLAN:  PT FREQUENCY: 2x/week  PT DURATION: 4 weeks  PLANNED INTERVENTIONS: Therapeutic exercises, Therapeutic activity, Neuromuscular re-education, Balance training, Gait training, Patient/Family education, Self Care, Joint mobilization, Vestibular training, Canalith repositioning, Visual/preceptual remediation/compensation, DME instructions, Aquatic Therapy, Dry Needling, Electrical stimulation, Spinal manipulation, Spinal mobilization, Cryotherapy, Moist heat, Taping, Traction, Manual therapy, and Re-evaluation  PLAN FOR NEXT SESSION:  suboccipital release, UT and levator stretches, chin tucks, cervical retraction, postural stability, thoracic mobilization stretch, TPDN to suboccipitals and UT   Westley FootsJennifer A Kayana Thoen, PT, DPT, CBIS 09/12/2022, 4:15 PM

## 2022-09-13 ENCOUNTER — Encounter: Payer: Self-pay | Admitting: Gastroenterology

## 2022-09-14 ENCOUNTER — Ambulatory Visit: Payer: BC Managed Care – PPO | Admitting: Physical Therapy

## 2022-09-14 DIAGNOSIS — M542 Cervicalgia: Secondary | ICD-10-CM

## 2022-09-14 DIAGNOSIS — M6281 Muscle weakness (generalized): Secondary | ICD-10-CM

## 2022-09-14 DIAGNOSIS — M5412 Radiculopathy, cervical region: Secondary | ICD-10-CM

## 2022-09-14 NOTE — Therapy (Signed)
OUTPATIENT PHYSICAL THERAPY CERVICAL TREATMENT   Patient Name: Marilyn [name] MRN: [id number] DOB:Mar 07, 1966, 57 y.o., female Today's Date: 09/14/2022  END OF SESSION:  PT End of Session - 09/14/22 1448     Visit Number 5    Number of Visits 9    Date for PT Re-Evaluation 10/09/22    Authorization Type [employer] [place]    PT Start Time 1445    PT Stop Time 1525    PT Time Calculation (min) 40 min    Activity Tolerance Patient limited by pain    Behavior During Therapy [facility] for tasks assessed/performed              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   Aortic atherosclerosis 02/07/2022   Arthritis    ASTHMA 01/30/2007   Asthma    Breast tumor    right; benign   Chest pain 01/24/2017   CHEST PAIN 01/29/2009   Qualifier: Diagnosis of  By: Alphonsus Sias MD, Ronnette Hila    Essential hypertension 07/26/2021   GERD 01/31/2007   Hemorrhoids    Hiatal hernia    Hyperlipidemia    Hypersomnolence 12/15/2012   HYPOTHYROIDISM 01/30/2007   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   Neck pain 09/09/2011   Obesity 04/11/2014   Pericardial effusion 02/07/2022   Sleep apnea    Past Surgical History:  Procedure Laterality Date   BREAST BIOPSY Right 12/2014   BREAST EXCISIONAL BIOPSY Right 2016   BREAST LUMPECTOMY WITH RADIOACTIVE SEED LOCALIZATION Right 02/25/2015   Procedure: BREAST LUMPECTOMY WITH RADIOACTIVE SEED LOCALIZATION;  Surgeon: Ovidio Kin, MD;  Location: [facility];  Service: General;  Laterality: Right;   BREAST REDUCTION SURGERY  1986   BREAST SURGERY     broken jaw     CARPAL TUNNEL RELEASE  2001   COLONOSCOPY     FRACTURE SURGERY     REDUCTION MAMMAPLASTY Bilateral    THYROIDECTOMY     TUBAL LIGATION  1987   UPPER GASTROINTESTINAL ENDOSCOPY     Patient Active Problem List   Diagnosis Date Noted   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   Hypoxemia associated with sleep 02/23/2022    Pericardial effusion 02/07/2022   Aortic atherosclerosis 02/07/2022   Essential hypertension 07/26/2021   Hyperlipidemia associated with type 2 diabetes mellitus 08/04/2020   Diet-controlled type 2 diabetes mellitus 07/20/2019   Swollen neck 04/09/2018   Urinary retention 08/05/2017   Atypical chest pain 01/23/2017   Nausea & vomiting 07/16/2015   RUQ abdominal pain 07/16/2015   RLQ abdominal pain 07/15/2015   Pain in lower back 11/30/2014   Acute upper respiratory infection 05/05/2014   Allergic rhinitis 05/05/2014   Hoarseness 05/05/2014   Dysphagia 05/05/2014   Obesity 04/11/2014   Edema, peripheral 04/11/2014   Cough 12/20/2012   Hypersomnolence 12/15/2012   Exertional dyspnea 12/15/2012   Right shoulder pain 12/15/2012   Abdominal pain 11/15/2011   Diabetes mellitus with coincident hypertension 09/09/2011   Neck pain 09/09/2011   Preventative health care 04/30/2011   HOT FLASHES 12/22/2009   CHEST PAIN 01/29/2009   Acute non-recurrent maxillary sinusitis 09/25/2008   HYPERTHYROIDISM 01/31/2007   GERD 01/31/2007   Hypothyroidism 01/30/2007   Hyperlipidemia 01/30/2007   Iron deficiency anemia 01/30/2007   ASTHMA 01/30/2007    PCP: Deeann Saint, MD  REFERRING PROVIDER: Ocie Doyne, MD  REFERRING DIAG: M54.2 (ICD-10-CM) - Cervicalgia  THERAPY DIAG:  Cervicalgia  Muscle weakness (generalized)  Radiculopathy, cervical region  Rationale for Evaluation and Treatment: Rehabilitation  ONSET DATE: 08/03/2022  SUBJECTIVE:                                                                                                                                                                                                         SUBJECTIVE STATEMENT: Pt reports that she has a headache now right now, reports it has been a long day and it's Friday and everyone is crazy. Pt states that her headaches start in the back of her head and come up the side of her head to her  forehead. Pt rates her current headache is 2/10. Pt does report that since starting therapy her symptoms and headaches have improved overall. Pt believes that she does have an eye doctor appt sometime in April, still has ongoing blurred vision and a flim over her eyes at times.  Hand dominance: Right  PERTINENT HISTORY:  DM, HTN, HLD, CAD, hypothyroidism  Per Dr. Delena Bali note 08/03/2022: The patient presents for evaluation of headaches which began one month ago. They are associated with photophobia, no phonophobia or nausea. Headaches are described as right sided pressure and shooting pain which radiates down her neck. She does report tension and stiffness of the neck as well. Pain will occasionally radiate down her arm and she has noticed decreased grip strength on the right. Headaches can last from 20 minutes to 2 hours at a time. She is currently averaging 5 headaches per month. She takes Tylenol or Flexeril as needed which does help.  PAIN:  Are you having pain? No  BP: 125/75  PRECAUTIONS: None  PATIENT GOALS: "improve overall function"   TODAY'S TREATMENT:                                                                                                                               TherAct Trigger Point Dry-Needling  Treatment instructions: Expect mild to moderate muscle soreness. S/S of pneumothorax if dry needled over a lung field, and  to seek immediate medical attention should they occur. Patient verbalized understanding of these instructions and education.  Patient Consent Given: Yes Education handout provided: Yes Muscles treated: L and R suboccipitals, L and R upper traps Treatment response/outcome: deep ache/muscle cramp; muscle twitch detected  Trigger Point Dry Needling  What is Trigger Point Dry Needling (DN)? DN is a physical therapy technique used to treat muscle pain and dysfunction. Specifically, DN helps deactivate muscle trigger points (muscle knots).  A thin  filiform needle is used to penetrate the skin and stimulate the underlying trigger point. The goal is for a local twitch response (LTR) to occur and for the trigger point to relax. No medication of any kind is injected during the procedure.   What Does Trigger Point Dry Needling Feel Like?  The procedure feels different for each individual patient. Some patients report that they do not actually feel the needle enter the skin and overall the process is not painful. Very mild bleeding may occur. However, many patients feel a deep cramping in the muscle in which the needle was inserted. This is the local twitch response.   How Will I feel after the treatment? Soreness is normal, and the onset of soreness may not occur for a few hours. Typically this soreness does not last longer than two days.  Bruising is uncommon, however; ice can be used to decrease any possible bruising.  In rare cases feeling tired or nauseous after the treatment is normal. In addition, your symptoms may get worse before they get better, this period will typically not last longer than 24 hours.   What Can I do After My Treatment? Increase your hydration by drinking more water for the next 24 hours. You may place ice or heat on the areas treated that have become sore, however, do not use heat on inflamed or bruised areas. Heat often brings more relief post needling. You can continue your regular activities, but vigorous activity is not recommended initially after the treatment for 24 hours. DN is best combined with other physical therapy such as strengthening, stretching, and other therapies.     TherEx Tried cervical rotation stretch with towel, onset of cramping in thoracic paraspinals Child's pose on mat table 4 x 15 sec with some relief of tightness Standing thoracic rotation at countertop 2 x 15 sec B, increase in cramping on L side > R side but when turning to the R  Added child's pose to HEP, see bolded  below   PATIENT EDUCATION:  Education details: continue HEP, added to HEP Person educated: Patient Education method: Explanation, Demonstration, Tactile cues, Verbal cues, and Handouts Education comprehension: verbalized understanding and needs further education  HOME EXERCISE PROGRAM: Access Code: [email or web address] URL: [email or web address] Date: 09/05/2022 Prepared by: Merry Lofty  Exercises - Seated Scapular Retraction  - 1 x daily - 7 x weekly - 3 sets - 10 reps - Seated Upper Trapezius Stretch  - 1 x daily - 7 x weekly - 3 sets - 30 hold - Seated Levator Scapulae Stretch  - 1 x daily - 7 x weekly - 3 sets - 30 hold - Seated Cervical Flexion Stretch with Finger Support Behind Neck  - 1 x daily - 7 x weekly - 3 sets - 30 hold - Seated Thoracic Self Mobilization  - 1 x daily - 7 x weekly - 3 sets - 30 hold - Single Arm Wall Angel  - 1 x daily - 7 x weekly - 3 sets - 10 reps -  Child's Pose Stretch  - 1 x daily - 7 x weekly - 1 sets - 5 reps - 15-30 sec hold  ASSESSMENT:  CLINICAL IMPRESSION: Emphasis of skilled PT session on initiating TPDN followed by stretching to address ongoing muscle tightness leading to cervical headaches. Pt with good response to TPDN this session with no increase in pain. Pt is limited in her ability to perform cervical and thoracic stretches by onset of cramping of thoracic paraspinal muscles. Pt continues to benefit from skilled therapy services to address ongoing pain limiting her functional mobility. Continue POC.    OBJECTIVE IMPAIRMENTS: decreased activity tolerance, decreased knowledge of condition, decreased ROM, decreased strength, dizziness, impaired perceived functional ability, impaired sensation, impaired UE functional use, postural dysfunction, and pain.   ACTIVITY LIMITATIONS: carrying and lifting  PARTICIPATION LIMITATIONS: meal prep, interpersonal relationship, driving, community activity, and occupation  PERSONAL FACTORS: 1-2  comorbidities:    DM, HTN, HLD, CAD, hypothyroidismare also affecting patient's functional outcome.   REHAB POTENTIAL: Good  CLINICAL DECISION MAKING: Stable/uncomplicated  EVALUATION COMPLEXITY: Moderate   GOALS: Goals reviewed with patient? Yes  SHORT TERM GOALS=LONG TERM GOALS due to length of POC  LONG TERM GOALS: Target date: 09/28/2022  Pt will be independent with final HEP for improved strength, balance, transfers and gait. Baseline: not established at eval Goal status: INITIAL  2.  Pt will improve cervical ROM by 10 degrees in all planes to demonstrate improved overall function Baseline: see below Active ROM A/PROM (deg) Eval 08/28/2022  Flexion 40 (pain in sternum)  Extension 25 (pain in back of neck)  Right lateral flexion 30 (Pain in R side of neck)  Left lateral flexion 20 (Pain in R side of neck)  Right rotation 38  Left rotation 44 (Pain in R side of neck)   Goal status: INITIAL  3.  Pt will decrease score on Headache Disability Index to 20 points to demonstrate decreased pain and improved function Baseline: 26 Goal status: INITIAL   PLAN:  PT FREQUENCY: 2x/week  PT DURATION: 4 weeks  PLANNED INTERVENTIONS: Therapeutic exercises, Therapeutic activity, Neuromuscular re-education, Balance training, Gait training, Patient/Family education, Self Care, Joint mobilization, Vestibular training, Canalith repositioning, Visual/preceptual remediation/compensation, DME instructions, Aquatic Therapy, Dry Needling, Electrical stimulation, Spinal manipulation, Spinal mobilization, Cryotherapy, Moist heat, Taping, Traction, Manual therapy, and Re-evaluation  PLAN FOR NEXT SESSION:  suboccipital release, UT and levator stretches, chin tucks, cervical retraction, postural stability, thoracic mobilization stretch, TPDN to suboccipitals and UT, how is thoracic cramping and childs pose?   Peter Congo, PT, DPT, CSRS 09/14/2022, 3:27 PM

## 2022-09-19 ENCOUNTER — Ambulatory Visit: Payer: BC Managed Care – PPO | Admitting: Physical Therapy

## 2022-09-19 ENCOUNTER — Ambulatory Visit: Payer: BC Managed Care – PPO | Admitting: Speech Pathology

## 2022-09-19 DIAGNOSIS — R1310 Dysphagia, unspecified: Secondary | ICD-10-CM

## 2022-09-19 DIAGNOSIS — M6281 Muscle weakness (generalized): Secondary | ICD-10-CM

## 2022-09-19 DIAGNOSIS — M542 Cervicalgia: Secondary | ICD-10-CM

## 2022-09-19 DIAGNOSIS — M5412 Radiculopathy, cervical region: Secondary | ICD-10-CM

## 2022-09-19 DIAGNOSIS — R490 Dysphonia: Secondary | ICD-10-CM

## 2022-09-19 NOTE — Therapy (Signed)
OUTPATIENT PHYSICAL THERAPY CERVICAL TREATMENT   Patient Name: Marilyn [name] MRN: [id number] DOB:14-Aug-1965, 57 y.o., female Today's Date: 09/19/2022  END OF SESSION:  PT End of Session - 09/19/22 1613     Visit Number 6    Number of Visits 9    Date for PT Re-Evaluation 10/09/22    Authorization Type [employer] [place]    PT Start Time [phone]    PT Stop Time 1655    PT Time Calculation (min) 42 min    Activity Tolerance Patient tolerated treatment well    Behavior During Therapy [facility] for tasks assessed/performed               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   Aortic atherosclerosis 02/07/2022   Arthritis    ASTHMA 01/30/2007   Asthma    Breast tumor    right; benign   Chest pain 01/24/2017   CHEST PAIN 01/29/2009   Qualifier: Diagnosis of  By: Alphonsus Sias MD, Ronnette Hila    Essential hypertension 07/26/2021   GERD 01/31/2007   Hemorrhoids    Hiatal hernia    Hyperlipidemia    Hypersomnolence 12/15/2012   HYPOTHYROIDISM 01/30/2007   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   Neck pain 09/09/2011   Obesity 04/11/2014   Pericardial effusion 02/07/2022   Sleep apnea    Past Surgical History:  Procedure Laterality Date   BREAST BIOPSY Right 12/2014   BREAST EXCISIONAL BIOPSY Right 2016   BREAST LUMPECTOMY WITH RADIOACTIVE SEED LOCALIZATION Right 02/25/2015   Procedure: BREAST LUMPECTOMY WITH RADIOACTIVE SEED LOCALIZATION;  Surgeon: Ovidio Kin, MD;  Location: [facility];  Service: General;  Laterality: Right;   BREAST REDUCTION SURGERY  1986   BREAST SURGERY     broken jaw     CARPAL TUNNEL RELEASE  2001   COLONOSCOPY     FRACTURE SURGERY     REDUCTION MAMMAPLASTY Bilateral    THYROIDECTOMY     TUBAL LIGATION  1987   UPPER GASTROINTESTINAL ENDOSCOPY     Patient Active Problem List   Diagnosis Date Noted   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   Hypoxemia associated with sleep 02/23/2022    Pericardial effusion 02/07/2022   Aortic atherosclerosis 02/07/2022   Essential hypertension 07/26/2021   Hyperlipidemia associated with type 2 diabetes mellitus 08/04/2020   Diet-controlled type 2 diabetes mellitus 07/20/2019   Swollen neck 04/09/2018   Urinary retention 08/05/2017   Atypical chest pain 01/23/2017   Nausea & vomiting 07/16/2015   RUQ abdominal pain 07/16/2015   RLQ abdominal pain 07/15/2015   Pain in lower back 11/30/2014   Acute upper respiratory infection 05/05/2014   Allergic rhinitis 05/05/2014   Hoarseness 05/05/2014   Dysphagia 05/05/2014   Obesity 04/11/2014   Edema, peripheral 04/11/2014   Cough 12/20/2012   Hypersomnolence 12/15/2012   Exertional dyspnea 12/15/2012   Right shoulder pain 12/15/2012   Abdominal pain 11/15/2011   Diabetes mellitus with coincident hypertension 09/09/2011   Neck pain 09/09/2011   Preventative health care 04/30/2011   HOT FLASHES 12/22/2009   CHEST PAIN 01/29/2009   Acute non-recurrent maxillary sinusitis 09/25/2008   HYPERTHYROIDISM 01/31/2007   GERD 01/31/2007   Hypothyroidism 01/30/2007   Hyperlipidemia 01/30/2007   Iron deficiency anemia 01/30/2007   ASTHMA 01/30/2007    PCP: Deeann Saint, MD  REFERRING PROVIDER: Ocie Doyne, MD  REFERRING DIAG: M54.2 (ICD-10-CM) - Cervicalgia  THERAPY DIAG:  Cervicalgia  Muscle weakness (  generalized)  Radiculopathy, cervical region  Rationale for Evaluation and Treatment: Rehabilitation  ONSET DATE: 08/03/2022  SUBJECTIVE:                                                                                                                                                                                                         SUBJECTIVE STATEMENT: Pt reports her pain and symptoms were good up until yesterday when she had onset of neck pain again. Pt reports that she did her exercises and her pain eased up, she did have stressful day at work the past few days. Pt  has not really had headaches since last session. Pt still dealing with stress from incident at school causing an increase in overall pain.  Hand dominance: Right  PERTINENT HISTORY:  DM, HTN, HLD, CAD, hypothyroidism  Per Dr. Delena Bali note 08/03/2022: The patient presents for evaluation of headaches which began one month ago. They are associated with photophobia, no phonophobia or nausea. Headaches are described as right sided pressure and shooting pain which radiates down her neck. She does report tension and stiffness of the neck as well. Pain will occasionally radiate down her arm and she has noticed decreased grip strength on the right. Headaches can last from 20 minutes to 2 hours at a time. She is currently averaging 5 headaches per month. She takes Tylenol or Flexeril as needed which does help.  PAIN:  Are you having pain? No  BP: 125/75  PRECAUTIONS: None  PATIENT GOALS: "improve overall function"   TODAY'S TREATMENT:                                                                                                                               TherAct Trigger Point Dry-Needling  Treatment instructions: Expect mild to moderate muscle soreness. S/S of pneumothorax if dry needled over a lung field, and to seek immediate medical attention should they occur. Patient verbalized understanding of these instructions and education.  Patient Consent Given: Yes Education handout provided: Yes Muscles  treated: L and R suboccipitals, L and R upper traps Treatment response/outcome: deep ache/muscle cramp; muscle twitch detected   Printout provided for where to purchase theracane online.  TherEx Seated cervical rotation stretch with towel 3 x 30 sec B Supine cervical retraction x 10 reps  Manual Therapy Suboccipital release 5 x 45 sec each   PATIENT EDUCATION:  Education details: continue HEP, added to HEP Person educated: Patient Education method: Explanation, Demonstration, Tactile  cues, Verbal cues, and Handouts Education comprehension: verbalized understanding and needs further education  HOME EXERCISE PROGRAM: Access Code: [email or web address] URL: [email or web address] Date: 09/05/2022 Prepared by: Merry Lofty  Exercises - Seated Scapular Retraction  - 1 x daily - 7 x weekly - 3 sets - 10 reps - Seated Upper Trapezius Stretch  - 1 x daily - 7 x weekly - 3 sets - 30 hold - Seated Levator Scapulae Stretch  - 1 x daily - 7 x weekly - 3 sets - 30 hold - Seated Cervical Flexion Stretch with Finger Support Behind Neck  - 1 x daily - 7 x weekly - 3 sets - 30 hold - Seated Thoracic Self Mobilization  - 1 x daily - 7 x weekly - 3 sets - 30 hold - Single Arm Wall Angel  - 1 x daily - 7 x weekly - 3 sets - 10 reps - Child's Pose Stretch  - 1 x daily - 7 x weekly - 1 sets - 5 reps - 15-30 sec hold  ASSESSMENT:  CLINICAL IMPRESSION: Emphasis of skilled PT session on performing TPDN, manual therapy, and stretches to address ongoing neck pain. Overall pt appears to have an improvement in her symptoms and decreased incidence of headaches since starting PT. Pt continues to benefit from skilled therapy services to work towards LTGs and management of pain symptoms. Continue POC.    OBJECTIVE IMPAIRMENTS: decreased activity tolerance, decreased knowledge of condition, decreased ROM, decreased strength, dizziness, impaired perceived functional ability, impaired sensation, impaired UE functional use, postural dysfunction, and pain.   ACTIVITY LIMITATIONS: carrying and lifting  PARTICIPATION LIMITATIONS: meal prep, interpersonal relationship, driving, community activity, and occupation  PERSONAL FACTORS: 1-2 comorbidities:    DM, HTN, HLD, CAD, hypothyroidismare also affecting patient's functional outcome.   REHAB POTENTIAL: Good  CLINICAL DECISION MAKING: Stable/uncomplicated  EVALUATION COMPLEXITY: Moderate   GOALS: Goals reviewed with patient? Yes  SHORT TERM  GOALS=LONG TERM GOALS due to length of POC  LONG TERM GOALS: Target date: 09/28/2022  Pt will be independent with final HEP for improved strength, balance, transfers and gait. Baseline: not established at eval Goal status: INITIAL  2.  Pt will improve cervical ROM by 10 degrees in all planes to demonstrate improved overall function Baseline: see below Active ROM A/PROM (deg) Eval 08/28/2022  Flexion 40 (pain in sternum)  Extension 25 (pain in back of neck)  Right lateral flexion 30 (Pain in R side of neck)  Left lateral flexion 20 (Pain in R side of neck)  Right rotation 38  Left rotation 44 (Pain in R side of neck)   Goal status: INITIAL  3.  Pt will decrease score on Headache Disability Index to 20 points to demonstrate decreased pain and improved function Baseline: 26 Goal status: INITIAL   PLAN:  PT FREQUENCY: 2x/week  PT DURATION: 4 weeks  PLANNED INTERVENTIONS: Therapeutic exercises, Therapeutic activity, Neuromuscular re-education, Balance training, Gait training, Patient/Family education, Self Care, Joint mobilization, Vestibular training, Canalith repositioning, Visual/preceptual remediation/compensation, DME instructions, Aquatic Therapy, Dry Needling, Electrical stimulation,  Spinal manipulation, Spinal mobilization, Cryotherapy, Moist heat, Taping, Traction, Manual therapy, and Re-evaluation  PLAN FOR NEXT SESSION:  suboccipital release, UT and levator stretches, chin tucks, cervical retraction, postural stability, thoracic mobilization stretch, TPDN to suboccipitals and UT, how is thoracic cramping and childs pose?   Peter Congo, PT, DPT, CSRS 09/19/2022, 4:56 PM

## 2022-09-19 NOTE — Patient Instructions (Addendum)
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   Advise you return to see an ENT so they can do imaging!   ACID REFLUX PRECAUTIONS   ACID REFLUX can be a possible cause of a voice disorder or throat irritation. Acid reflux is a disorder where acid from your stomach is abnormally spilled over onto your voice box after eating, during sleep, or even during singing. Acid reflux causes irritation and inflammation to your vocal folds and should be avoided and treated by changing eating habits, changing lifestyle, and taking medication (if prescribed by your doctor).   CHANGE EATING HABITS   Avoid "trigger" foods. Certain foods and drinks can trigger acid reflux.  1. Caffeine- in coffee, tea, chocolate, sodas  2. Carbonated beverages  3. Mint and menthol  4. Fatty/fried foods  5. Citrus fruits  6. Tomato products  7. Spicy foods  8. Alcohol    CHANGING LIFESTYLE HABITS   Drink 8 glasses of water per day (64oz)    Stop smoking   Avoid clearing your throat   Allow 3 hours between last big meal and going to bed at night   Keep yourself upright for one hour after you eat   Elevate the head of your bed using 6-inch blocks under the head of the bed or a bed wedge between the box spring and the mattress.   Eat small meals throughout the day rather than 3 big meals   Eat slowly   Wear loose clothing   TAKING MEDICATION   If prescribed one time a day, take 15-30 minutes before breakfast   If prescribed two times a day, take 15-30 minutes before breakfast and 15- 30 minutes before dinner   CHANGING THE WAY YOU USE YOUR VOICE  "Best voice/ Least effort"

## 2022-09-19 NOTE — Therapy (Unsigned)
OUTPATIENT SPEECH LANGUAGE PATHOLOGY SWALLOW EVALUATION   Patient Name: Marilyn [name] MRN: [id number] DOB:Dec 20, 1965, 57 y.o., female Today's Date: 09/20/2022  PCP: Dr Abbe Amsterdam REFERRING PROVIDER: Ocie Doyne, MD   END OF SESSION:  End of Session - 09/19/22 1535     Visit Number 1    Number of Visits 1    Authorization Type [employer]    SLP Start Time 1530    SLP Stop Time  1615    SLP Time Calculation (min) 45 min    Activity Tolerance Patient tolerated treatment well             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   Aortic atherosclerosis 02/07/2022   Arthritis    ASTHMA 01/30/2007   Asthma    Breast tumor    right; benign   Chest pain 01/24/2017   CHEST PAIN 01/29/2009   Qualifier: Diagnosis of  By: Alphonsus Sias MD, Ronnette Hila    Essential hypertension 07/26/2021   GERD 01/31/2007   Hemorrhoids    Hiatal hernia    Hyperlipidemia    Hypersomnolence 12/15/2012   HYPOTHYROIDISM 01/30/2007   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   Neck pain 09/09/2011   Obesity 04/11/2014   Pericardial effusion 02/07/2022   Sleep apnea    Past Surgical History:  Procedure Laterality Date   BREAST BIOPSY Right 12/2014   BREAST EXCISIONAL BIOPSY Right 2016   BREAST LUMPECTOMY WITH RADIOACTIVE SEED LOCALIZATION Right 02/25/2015   Procedure: BREAST LUMPECTOMY WITH RADIOACTIVE SEED LOCALIZATION;  Surgeon: Ovidio Kin, MD;  Location: [facility];  Service: General;  Laterality: Right;   BREAST REDUCTION SURGERY  1986   BREAST SURGERY     broken jaw     CARPAL TUNNEL RELEASE  2001   COLONOSCOPY     FRACTURE SURGERY     REDUCTION MAMMAPLASTY Bilateral    THYROIDECTOMY     TUBAL LIGATION  1987   UPPER GASTROINTESTINAL ENDOSCOPY     Patient Active Problem List   Diagnosis Date Noted   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   Hypoxemia associated with sleep 02/23/2022   Pericardial effusion 02/07/2022    Aortic atherosclerosis 02/07/2022   Essential hypertension 07/26/2021   Hyperlipidemia associated with type 2 diabetes mellitus 08/04/2020   Diet-controlled type 2 diabetes mellitus 07/20/2019   Swollen neck 04/09/2018   Urinary retention 08/05/2017   Atypical chest pain 01/23/2017   Nausea & vomiting 07/16/2015   RUQ abdominal pain 07/16/2015   RLQ abdominal pain 07/15/2015   Pain in lower back 11/30/2014   Acute upper respiratory infection 05/05/2014   Allergic rhinitis 05/05/2014   Hoarseness 05/05/2014   Dysphagia 05/05/2014   Obesity 04/11/2014   Edema, peripheral 04/11/2014   Cough 12/20/2012   Hypersomnolence 12/15/2012   Exertional dyspnea 12/15/2012   Right shoulder pain 12/15/2012   Abdominal pain 11/15/2011   Diabetes mellitus with coincident hypertension 09/09/2011   Neck pain 09/09/2011   Preventative health care 04/30/2011   HOT FLASHES 12/22/2009   CHEST PAIN 01/29/2009   Acute non-recurrent maxillary sinusitis 09/25/2008   HYPERTHYROIDISM 01/31/2007   GERD 01/31/2007   Hypothyroidism 01/30/2007   Hyperlipidemia 01/30/2007   Iron deficiency anemia 01/30/2007   ASTHMA 01/30/2007    ONSET DATE: Referral 08-30-22   REFERRING DIAG: R13.10 (ICD-10-CM) - Dysphagia, unspecified type   THERAPY DIAG:  Dysphagia, unspecified type  Dysphonia  Rationale for Evaluation and Treatment: Rehabilitation  SUBJECTIVE:   SUBJECTIVE STATEMENT: "  It feels like something is blocking my throat. I can't cough up phlegm. I have to suck it up to get it out. I'm starting to get strangled on saliva when I bend down" Pt accompanied by: self  PERTINENT HISTORY: DM, HTN, HLD, CAD, hypothyroidism   Per Dr. Delena Bali note 08/03/2022: The patient presents for evaluation of headaches which began one month ago. They are associated with photophobia, no phonophobia or nausea. Headaches are described as right sided pressure and shooting pain which radiates down her neck. She does report tension  and stiffness of the neck as well. Pain will occasionally radiate down her arm and she has noticed decreased grip strength on the right. Headaches can last from 20 minutes to 2 hours at a time. She is currently averaging 5 headaches per month. She takes Tylenol or Flexeril as needed which does help.    PAIN:  Are you having pain? No  FALLS: Has patient fallen in last 6 months?  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 Oropharynx is clear with slightly prominent lingual tonsils. 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 No pooling in valleculae or piriforms. Incompletely controlled reflux. Dysphagia. Laryngospasm.   BARIUM SWALLOW 11/21/2018 IMPRESSION: 1. Small hiatal hernia.  Severe gastroesophageal reflux elicited. 2. 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 3. Mild reflux esophagitis. No evidence of esophageal mass, stricture or ulcer, see comments. 4. Premature spillage of barium bolus into the vallecula and hypopharynx with every swallow. Otherwise normal oral and pharyngeal phases of swallowing.  COGNITION: Overall cognitive status: Within functional limits for tasks assessed  OBJECTIVE VOICE MEASUREMENTS Maximum phonation time: 15 seconds   s/z ratio: 1 (suggestive of dysfunction > 1.0)   PERCEPTUAL VOICE ASSESSMENT: Vocal quality: Normal in connected speech, pt reporting is c/w baseline with exception of reports that her voice will give  out, especially when yelling. Respiration and resonance: appear within gross functional limits Vocal abuse: throat clears and coughing (not with PO) observed throughout session (20+ over 45 minute session). Reports that this is new 2/2 allergies.  Comments: Intermittent periods of aphonia observed during sustained phonation tasks  ORAL MOTOR EXAMINATION: Overall status: Did not assess  CLINICAL SWALLOW ASSESSMENT:   Current diet: regular Dentition: adequate natural dentition Patient directly observed with POs: Yes: dysphagia 1 (puree) and thin liquids  Feeding: able to feed self Liquids provided by: cup Ninf[facility] Swallow Protocol completed)  Oral phase signs and symptoms:  WFL  Pharyngeal phase signs and symptoms:  No overt s/sx aspiration observed   Comments: Pt denies challenges with PO intake, has not altered diet. Primary concern is "choking" on her secretions.   PATIENT REPORTED OUTCOME MEASURES (PROM): Did not complete   TODAY'S TREATMENT:  09-19-22: Education provided on 2020 results from ENT visits which were remarkable for paralyzed R vocal fold and laryngospasm. Education provided on potential for increased sensitivity in laryngeal area 2/2 aforementioned deficits and known hx of GERD. Pt reporting ongoing sensation of "something stuck in throat." Advised that chronic throat clearing and coughing can also have impact on sensitivity and encourage mucus production. Provided recommendations for reflux management including diet modifications. Demonstration of breathing techniques which may aid in inhalation when episodes occur (pursed lip exhale), with pt able to teach back with mod-I. Recommend referral to laryngologist (e.g. [facility]) to assess current laryngeal functioning and provided targeted  interventions based on instrumental findings. Pt agreeable.    PATIENT EDUCATION: Education details: See above Person educated: Patient Education method: Explanation Education comprehension: verbalized understanding   ASSESSMENT:  CLINICAL IMPRESSION: Patient is a 57 y.o. female who was seen today for dysphagia evaluation. 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 Voice assessment notable for inconsistent phonation during sustained phonation tasks (periods of aphonia), with pt reporting this occurs when she is "yelling." Otherwise voice when speaking is at baseline. Aphonia is not appreciated during conversational speech with vocal quality noted to be strong and clear. Pt reports dysphagia only occurring with her secretions, denies challenges with PO. [facility] Swallow Protocol completed, pt passed, though did report sequential swallows was challenging to coordinate. Barium swallow study (2020) was notable for premature spillage though no aspiration/penetration was appreciated. SLP suspects that pt's reported symptoms are related to documented esophageal dysfunction and laryngeal spasms/VF paralysis, though imaging is necessary to support. Educated pt on breathing technique (breathing through pursed lips) to A in successful inhaltion when episodes occur. Education provided on evaluation results and recommendations.  Recommend consideration for consultation with laryngologist to further assess patient's current laryngeal function. Pt is in agreement. No ST f/u is indicated at this time until imaging is completed. Swallow evaluation is not recommended d/t patient complaints not concerning PO intake.    PLAN:  SLP FREQUENCY: one time visit   Maia Breslow, CCC-SLP 09/20/2022, 8:51 AM

## 2022-09-20 ENCOUNTER — Encounter: Payer: Self-pay | Admitting: Speech Pathology

## 2022-09-21 ENCOUNTER — Ambulatory Visit: Payer: BC Managed Care – PPO | Admitting: Physical Therapy

## 2022-09-21 DIAGNOSIS — M6281 Muscle weakness (generalized): Secondary | ICD-10-CM | POA: Diagnosis not present

## 2022-09-21 DIAGNOSIS — M5412 Radiculopathy, cervical region: Secondary | ICD-10-CM

## 2022-09-21 DIAGNOSIS — M542 Cervicalgia: Secondary | ICD-10-CM

## 2022-09-21 NOTE — Therapy (Signed)
OUTPATIENT PHYSICAL THERAPY CERVICAL TREATMENT   Patient Name: Marilyn [name] MRN: [id number] DOB:Sep 10, 1965, 57 y.o., female Today's Date: 09/21/2022  END OF SESSION:  PT End of Session - 09/21/22 1452     Visit Number 7    Number of Visits 9    Date for PT Re-Evaluation 10/09/22    Authorization Type [employer]    PT Start Time 1447    PT Stop Time 1530    PT Time Calculation (min) 43 min    Activity Tolerance Patient tolerated treatment well    Behavior During Therapy [facility] for tasks assessed/performed                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   Aortic atherosclerosis 02/07/2022   Arthritis    ASTHMA 01/30/2007   Asthma    Breast tumor    right; benign   Chest pain 01/24/2017   CHEST PAIN 01/29/2009   Qualifier: Diagnosis of  By: Alphonsus Sias MD, Ronnette Hila    Essential hypertension 07/26/2021   GERD 01/31/2007   Hemorrhoids    Hiatal hernia    Hyperlipidemia    Hypersomnolence 12/15/2012   HYPOTHYROIDISM 01/30/2007   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   Neck pain 09/09/2011   Obesity 04/11/2014   Pericardial effusion 02/07/2022   Sleep apnea    Past Surgical History:  Procedure Laterality Date   BREAST BIOPSY Right 12/2014   BREAST EXCISIONAL BIOPSY Right 2016   BREAST LUMPECTOMY WITH RADIOACTIVE SEED LOCALIZATION Right 02/25/2015   Procedure: BREAST LUMPECTOMY WITH RADIOACTIVE SEED LOCALIZATION;  Surgeon: Ovidio Kin, MD;  Location: [facility];  Service: General;  Laterality: Right;   BREAST REDUCTION SURGERY  1986   BREAST SURGERY     broken jaw     CARPAL TUNNEL RELEASE  2001   COLONOSCOPY     FRACTURE SURGERY     REDUCTION MAMMAPLASTY Bilateral    THYROIDECTOMY     TUBAL LIGATION  1987   UPPER GASTROINTESTINAL ENDOSCOPY     Patient Active Problem List   Diagnosis Date Noted   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   Hypoxemia associated with sleep  02/23/2022   Pericardial effusion 02/07/2022   Aortic atherosclerosis 02/07/2022   Essential hypertension 07/26/2021   Hyperlipidemia associated with type 2 diabetes mellitus 08/04/2020   Diet-controlled type 2 diabetes mellitus 07/20/2019   Swollen neck 04/09/2018   Urinary retention 08/05/2017   Atypical chest pain 01/23/2017   Nausea & vomiting 07/16/2015   RUQ abdominal pain 07/16/2015   RLQ abdominal pain 07/15/2015   Pain in lower back 11/30/2014   Acute upper respiratory infection 05/05/2014   Allergic rhinitis 05/05/2014   Hoarseness 05/05/2014   Dysphagia 05/05/2014   Obesity 04/11/2014   Edema, peripheral 04/11/2014   Cough 12/20/2012   Hypersomnolence 12/15/2012   Exertional dyspnea 12/15/2012   Right shoulder pain 12/15/2012   Abdominal pain 11/15/2011   Diabetes mellitus with coincident hypertension 09/09/2011   Neck pain 09/09/2011   Preventative health care 04/30/2011   HOT FLASHES 12/22/2009   CHEST PAIN 01/29/2009   Acute non-recurrent maxillary sinusitis 09/25/2008   HYPERTHYROIDISM 01/31/2007   GERD 01/31/2007   Hypothyroidism 01/30/2007   Hyperlipidemia 01/30/2007   Iron deficiency anemia 01/30/2007   ASTHMA 01/30/2007    PCP: Deeann Saint, MD  REFERRING PROVIDER: Ocie Doyne, MD  REFERRING DIAG: M54.2 (ICD-10-CM) - Cervicalgia  THERAPY DIAG:  Cervicalgia  Muscle  weakness (generalized)  Radiculopathy, cervical region  Rationale for Evaluation and Treatment: Rehabilitation  ONSET DATE: 08/03/2022  SUBJECTIVE:                                                                                                                                                                                                         SUBJECTIVE STATEMENT: Pt reports that her neck pain has improved, no neck pain right now. Pt reports decreased tightness in her shoulders after DN last session.  Hand dominance: Right  PERTINENT HISTORY:  DM, HTN, HLD, CAD,  hypothyroidism  Per Dr. Delena Bali note 08/03/2022: The patient presents for evaluation of headaches which began one month ago. They are associated with photophobia, no phonophobia or nausea. Headaches are described as right sided pressure and shooting pain which radiates down her neck. She does report tension and stiffness of the neck as well. Pain will occasionally radiate down her arm and she has noticed decreased grip strength on the right. Headaches can last from 20 minutes to 2 hours at a time. She is currently averaging 5 headaches per month. She takes Tylenol or Flexeril as needed which does help.  PAIN:  Are you having pain? No  BP: 125/75  PRECAUTIONS: None  PATIENT GOALS: "improve overall function"   TODAY'S TREATMENT:                                                                                                                               TherAct Trigger Point Dry-Needling  Treatment instructions: Expect mild to moderate muscle soreness. S/S of pneumothorax if dry needled over a lung field, and to seek immediate medical attention should they occur. Patient verbalized understanding of these instructions and education.  Patient Consent Given: Yes Education handout provided: Yes Muscles treated: L and R suboccipitals, L and R upper traps Treatment response/outcome: deep ache/muscle cramp; muscle twitch detected; most sensitive in R subocc and L UT   TherEx Supine thoracic mobilization stretch over towel roll x 10 reps with 5  sec hold Standing wall angels x 10 reps Pt noted to have decreased shoulder ER ROM, when attempted to stretch into ER pt with onset of cramping in thoracic musculature  Supine PROM B shoulder ER, appears to be [facility] but pt does have muscle guarding with increase in repetitions   PATIENT EDUCATION:  Education details: continue HEP, added to HEP Person educated: Patient Education method: Explanation, Demonstration, Tactile cues, Verbal cues, and  Handouts Education comprehension: verbalized understanding and needs further education  HOME EXERCISE PROGRAM: Access Code: [email or web address] URL: [email or web address] Date: 09/05/2022 Prepared by: Merry Lofty  Exercises - Seated Scapular Retraction  - 1 x daily - 7 x weekly - 3 sets - 10 reps - Seated Upper Trapezius Stretch  - 1 x daily - 7 x weekly - 3 sets - 30 hold - Seated Levator Scapulae Stretch  - 1 x daily - 7 x weekly - 3 sets - 30 hold - Seated Cervical Flexion Stretch with Finger Support Behind Neck  - 1 x daily - 7 x weekly - 3 sets - 30 hold - Seated Thoracic Self Mobilization  - 1 x daily - 7 x weekly - 3 sets - 30 hold - Single Arm Wall Angel  - 1 x daily - 7 x weekly - 3 sets - 10 reps - Child's Pose Stretch  - 1 x daily - 7 x weekly - 1 sets - 5 reps - 15-30 sec hold - Supine Thoracic Mobilization Towel Roll Vertical with Arm Stretch  - 1 x daily - 7 x weekly - 2 sets - 10 reps - 5 sec hold  ASSESSMENT:  CLINICAL IMPRESSION: Emphasis of skilled PT session on performing TPDN and stretching to address ongoing tightness leading to increase pain and headaches. Pt has had an improvement in symptoms over the past week with decreased tightness in neck and shoulder musculature. Pt continues to benefit from skilled therapy services to improve symptom management and improve her overall functional level. Continue POC.   OBJECTIVE IMPAIRMENTS: decreased activity tolerance, decreased knowledge of condition, decreased ROM, decreased strength, dizziness, impaired perceived functional ability, impaired sensation, impaired UE functional use, postural dysfunction, and pain.   ACTIVITY LIMITATIONS: carrying and lifting  PARTICIPATION LIMITATIONS: meal prep, interpersonal relationship, driving, community activity, and occupation  PERSONAL FACTORS: 1-2 comorbidities:    DM, HTN, HLD, CAD, hypothyroidismare also affecting patient's functional outcome.   REHAB POTENTIAL:  Good  CLINICAL DECISION MAKING: Stable/uncomplicated  EVALUATION COMPLEXITY: Moderate   GOALS: Goals reviewed with patient? Yes  SHORT TERM GOALS=LONG TERM GOALS due to length of POC  LONG TERM GOALS: Target date: 09/28/2022  Pt will be independent with final HEP for improved strength, balance, transfers and gait. Baseline: not established at eval Goal status: INITIAL  2.  Pt will improve cervical ROM by 10 degrees in all planes to demonstrate improved overall function Baseline: see below Active ROM A/PROM (deg) Eval 08/28/2022  Flexion 40 (pain in sternum)  Extension 25 (pain in back of neck)  Right lateral flexion 30 (Pain in R side of neck)  Left lateral flexion 20 (Pain in R side of neck)  Right rotation 38  Left rotation 44 (Pain in R side of neck)   Goal status: INITIAL  3.  Pt will decrease score on Headache Disability Index to 20 points to demonstrate decreased pain and improved function Baseline: 26 Goal status: INITIAL   PLAN:  PT FREQUENCY: 2x/week  PT DURATION: 4 weeks  PLANNED INTERVENTIONS: Therapeutic exercises, Therapeutic activity,  Neuromuscular re-education, Balance training, Gait training, Patient/Family education, Self Care, Joint mobilization, Vestibular training, Canalith repositioning, Visual/preceptual remediation/compensation, DME instructions, Aquatic Therapy, Dry Needling, Electrical stimulation, Spinal manipulation, Spinal mobilization, Cryotherapy, Moist heat, Taping, Traction, Manual therapy, and Re-evaluation  PLAN FOR NEXT SESSION:  suboccipital release, UT and levator stretches, chin tucks, cervical retraction, postural stability, thoracic mobilization stretch, TPDN to suboccipitals and UT, how is thoracic cramping and childs pose?   Peter Congo, PT, DPT, CSRS 09/21/2022, 3:34 PM

## 2022-09-23 ENCOUNTER — Other Ambulatory Visit: Payer: Self-pay | Admitting: Family Medicine

## 2022-09-23 DIAGNOSIS — R0602 Shortness of breath: Secondary | ICD-10-CM

## 2022-09-24 ENCOUNTER — Ambulatory Visit: Payer: BC Managed Care – PPO | Admitting: Internal Medicine

## 2022-09-24 NOTE — Progress Notes (Deleted)
Office Visit Note  Patient: Marilyn [name]             Date of Birth: March 05, 1966           MRN: [id number]             PCP: Deeann Saint, MD Referring: Deeann Saint, MD Visit Date: 09/24/2022   Subjective:  No chief complaint on file.   History of Present Illness: Marilyn [name] is a 57 y.o. female here for follow up ***   Previous HPI 08/20/22 Marilyn [name] is a 57 y.o. female here for evaluation of episodic pain and swelling affecting hands and feet with elevated sedimentation rate, also with pericardial effusion and equivocal imaging findings for possible amyloid.  She reports bilateral hand pain has been noticeable or more of a problem since last year around the time she started regular follow-up with Dr. Salomon Fick.  She notices swelling worst around the MCP joints in the hand alternating from 1 side to the other usually about 1 to 2 weeks at a time.  This is associated with morning pain and stiffness lasting several minutes but also has mild swelling redness and difficulty fully closing her grip on the affected side.  Foot pain is more recent just within the past 3 months notices pain across the top of the midfoot usually provoked with prolonged time on her feet and walking.  She describes sensation as if something is applying pressure or standing on her foot.  Does not see much swelling in the feet compared to the hands.  She is taken Tylenol and Aleve as needed for symptoms.  Currently avoiding NSAIDs in anticipation of routine screening colonoscopy coming up.  Previous history of right-sided jaw fracture no other major joint injury or surgeries.  Has family history of rheumatoid arthritis in her maternal grandmother.   07/2021 ANA neg   No Rheumatology ROS completed.   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   Hypoxemia associated with sleep 02/23/2022   Pericardial effusion  02/07/2022   Aortic atherosclerosis 02/07/2022   Essential hypertension 07/26/2021   Hyperlipidemia associated with type 2 diabetes mellitus 08/04/2020   Diet-controlled type 2 diabetes mellitus 07/20/2019   Swollen neck 04/09/2018   Urinary retention 08/05/2017   Atypical chest pain 01/23/2017   Nausea & vomiting 07/16/2015   RUQ abdominal pain 07/16/2015   RLQ abdominal pain 07/15/2015   Pain in lower back 11/30/2014   Acute upper respiratory infection 05/05/2014   Allergic rhinitis 05/05/2014   Hoarseness 05/05/2014   Dysphagia 05/05/2014   Obesity 04/11/2014   Edema, peripheral 04/11/2014   Cough 12/20/2012   Hypersomnolence 12/15/2012   Exertional dyspnea 12/15/2012   Right shoulder pain 12/15/2012   Abdominal pain 11/15/2011   Diabetes mellitus with coincident hypertension 09/09/2011   Neck pain 09/09/2011   Preventative health care 04/30/2011   HOT FLASHES 12/22/2009   CHEST PAIN 01/29/2009   Acute non-recurrent maxillary sinusitis 09/25/2008   HYPERTHYROIDISM 01/31/2007   GERD 01/31/2007   Hypothyroidism 01/30/2007   Hyperlipidemia 01/30/2007   Iron deficiency anemia 01/30/2007   ASTHMA 01/30/2007    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   Aortic atherosclerosis 02/07/2022   Arthritis    ASTHMA 01/30/2007   Asthma    Breast tumor    right; benign   Chest pain 01/24/2017   CHEST PAIN 01/29/2009  Qualifier: Diagnosis of  By: Alphonsus Sias MD, Ronnette Hila    Essential hypertension 07/26/2021   GERD 01/31/2007   Hemorrhoids    Hiatal hernia    Hyperlipidemia    Hypersomnolence 12/15/2012   HYPOTHYROIDISM 01/30/2007   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   Neck pain 09/09/2011   Obesity 04/11/2014   Pericardial effusion 02/07/2022   Sleep apnea     Family History  Problem Relation Age of Onset   Asthma Mother    Hypertension Father    Diabetes Father    Hyperlipidemia Father    Breast cancer Sister 74   Thyroid  disease Sister    Migraines Daughter    Breast cancer Maternal Aunt    Diabetes Maternal Grandmother    Heart disease Maternal Grandmother    Heart attack Maternal Grandmother 60   Stroke Maternal Grandmother    Asthma Maternal Grandfather    Heart attack Maternal Grandfather    Stroke Maternal Grandfather    Colon cancer Brother    Esophageal cancer Neg Hx    Stomach cancer Neg Hx    Rectal cancer Neg Hx    Past Surgical History:  Procedure Laterality Date   BREAST BIOPSY Right 12/2014   BREAST EXCISIONAL BIOPSY Right 2016   BREAST LUMPECTOMY WITH RADIOACTIVE SEED LOCALIZATION Right 02/25/2015   Procedure: BREAST LUMPECTOMY WITH RADIOACTIVE SEED LOCALIZATION;  Surgeon: Ovidio Kin, MD;  Location: [facility];  Service: General;  Laterality: Right;   BREAST REDUCTION SURGERY  1986   BREAST SURGERY     broken jaw     CARPAL TUNNEL RELEASE  2001   COLONOSCOPY     FRACTURE SURGERY     REDUCTION MAMMAPLASTY Bilateral    THYROIDECTOMY     TUBAL LIGATION  1987   UPPER GASTROINTESTINAL ENDOSCOPY     Social History   Social History Narrative   Married   Immunization History  Administered Date(s) Administered   Influenza Split 04/30/2011   Influenza Whole 03/04/2012   Influenza,inj,Quad PF,6+ Mos 04/01/2014, 07/20/2015, 02/20/2017   Influenza-Unspecified 02/02/2014   PPD Test 03/25/2017   Pneumococcal Polysaccharide-23 04/01/2014   Td 06/04/1997, 12/22/2009     Objective: Vital Signs: LMP 02/21/2013    Physical Exam   Musculoskeletal Exam: ***  CDAI Exam: CDAI Score: -- Patient Global: --; Provider Global: -- Swollen: --; Tender: -- Joint Exam 09/24/2022   No joint exam has been documented for this visit   There is currently no information documented on the homunculus. Go to the Rheumatology activity and complete the homunculus joint exam.  Investigation: No additional findings.  Imaging: MM 3D SCREENING MAMMOGRAM BILATERAL BREAST  Result  Date: 09/03/2022 CLINICAL DATA:  Screening. EXAM: DIGITAL SCREENING BILATERAL MAMMOGRAM WITH TOMOSYNTHESIS AND CAD TECHNIQUE: Bilateral screening digital craniocaudal and mediolateral oblique mammograms were obtained. Bilateral screening digital breast tomosynthesis was performed. The images were evaluated with computer-aided detection. COMPARISON:  Previous exam(s). ACR Breast Density Category a: The breasts are almost entirely fatty. FINDINGS: There are no findings suspicious for malignancy. IMPRESSION: No mammographic evidence of malignancy. A result letter of this screening mammogram will be mailed directly to the patient. RECOMMENDATION: Screening mammogram in one year. (Code:SM-B-01Y) BI-RADS CATEGORY  1: Negative. Electronically Signed   By: Norva Pavlov M.D.   On: 09/03/2022 09:44    Recent Labs: Lab Results  Component Value Date   WBC 4.2 06/07/2022   HGB 12.6 06/07/2022   PLT 230 06/07/2022   NA 141 08/03/2022   K  4.4 08/03/2022   CL 104 08/03/2022   CO2 25 08/03/2022   GLUCOSE 76 08/03/2022   BUN 10 08/03/2022   CREATININE 0.89 08/03/2022   BILITOT <0.2 08/03/2022   ALKPHOS 86 08/03/2022   AST 27 08/03/2022   ALT 19 08/03/2022   PROT 7.2 08/03/2022   ALBUMIN 4.3 08/03/2022   CALCIUM 8.7 08/03/2022   GFRAA 96 03/04/2020   QFTBGOLDPLUS Negative 03/29/2017    Speciality Comments: No specialty comments available.  Procedures:  No procedures performed Allergies: Promethazine-dm, [place] allergy], Codeine, Fluticasone propionate, Promethazine, and Flonase [fluticasone propionate]   Assessment / Plan:     Visit Diagnoses: No diagnosis found.  ***  Orders: No orders of the defined types were placed in this encounter.  No orders of the defined types were placed in this encounter.    Follow-Up Instructions: No follow-ups on file.   Fuller Plan, MD  Note - This record has been created using [employer].  Chart creation errors have been sought, but  may not always  have been located. Such creation errors do not reflect on  the standard of medical care.

## 2022-09-25 NOTE — Therapy (Signed)
OUTPATIENT OCCUPATIONAL THERAPY ORTHO EVALUATION  Patient Name: Marilyn [name] MRN: [id number] DOB:Nov 02, 1965, 57 y.o., female Today's Date: 09/26/2022  PCP: Abbe Amsterdam, MD REFERRING PROVIDER: Ocie Doyne, MD   END OF SESSION:  OT End of Session - 09/26/22 1756     Visit Number 1    Number of Visits 7    Date for OT Re-Evaluation 11/09/22    Authorization Type BC[employer]    OT Start Time 1613    OT Stop Time 1659    OT Time Calculation (min) 46 min    Equipment Utilized During Treatment band-aide    Activity Tolerance Patient tolerated treatment well;Patient limited by fatigue;Patient limited by pain    Behavior During Therapy [facility] for tasks assessed/performed             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   Aortic atherosclerosis 02/07/2022   Arthritis    ASTHMA 01/30/2007   Asthma    Breast tumor    right; benign   Chest pain 01/24/2017   CHEST PAIN 01/29/2009   Qualifier: Diagnosis of  By: Alphonsus Sias MD, Ronnette Hila    Essential hypertension 07/26/2021   GERD 01/31/2007   Hemorrhoids    Hiatal hernia    Hyperlipidemia    Hypersomnolence 12/15/2012   HYPOTHYROIDISM 01/30/2007   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   Neck pain 09/09/2011   Obesity 04/11/2014   Pericardial effusion 02/07/2022   Sleep apnea    Past Surgical History:  Procedure Laterality Date   BREAST BIOPSY Right 12/2014   BREAST EXCISIONAL BIOPSY Right 2016   BREAST LUMPECTOMY WITH RADIOACTIVE SEED LOCALIZATION Right 02/25/2015   Procedure: BREAST LUMPECTOMY WITH RADIOACTIVE SEED LOCALIZATION;  Surgeon: Ovidio Kin, MD;  Location: [facility];  Service: General;  Laterality: Right;   BREAST REDUCTION SURGERY  1986   BREAST SURGERY     broken jaw     CARPAL TUNNEL RELEASE  2001   COLONOSCOPY     FRACTURE SURGERY     REDUCTION MAMMAPLASTY Bilateral    THYROIDECTOMY     TUBAL LIGATION  1987   UPPER GASTROINTESTINAL  ENDOSCOPY     Patient Active Problem List   Diagnosis Date Noted   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   Hypoxemia associated with sleep 02/23/2022   Pericardial effusion 02/07/2022   Aortic atherosclerosis 02/07/2022   Essential hypertension 07/26/2021   Hyperlipidemia associated with type 2 diabetes mellitus 08/04/2020   Diet-controlled type 2 diabetes mellitus 07/20/2019   Swollen neck 04/09/2018   Urinary retention 08/05/2017   Atypical chest pain 01/23/2017   Nausea & vomiting 07/16/2015   RUQ abdominal pain 07/16/2015   RLQ abdominal pain 07/15/2015   Pain in lower back 11/30/2014   Acute upper respiratory infection 05/05/2014   Allergic rhinitis 05/05/2014   Hoarseness 05/05/2014   Dysphagia 05/05/2014   Obesity 04/11/2014   Edema, peripheral 04/11/2014   Cough 12/20/2012   Hypersomnolence 12/15/2012   Exertional dyspnea 12/15/2012   Right shoulder pain 12/15/2012   Abdominal pain 11/15/2011   Diabetes mellitus with coincident hypertension 09/09/2011   Neck pain 09/09/2011   Preventative health care 04/30/2011   HOT FLASHES 12/22/2009   CHEST PAIN 01/29/2009   Acute non-recurrent maxillary sinusitis 09/25/2008   HYPERTHYROIDISM 01/31/2007   GERD 01/31/2007   Hypothyroidism 01/30/2007   Hyperlipidemia 01/30/2007   Iron deficiency anemia 01/30/2007   ASTHMA 01/30/2007    ONSET DATE: ~1-2 months onset (acute)  REFERRING DIAG: R29.898 (ICD-10-CM) - Hand weakness   THERAPY DIAG:  Pain in right hand  Muscle weakness (generalized)  Paresthesia of skin  Pain in left hand  Rationale for Evaluation and Treatment: Rehabilitation  SUBJECTIVE:   SUBJECTIVE STATEMENT: She is a Engineer, 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 She states "stabbing" pain in Rt hand 2nd MCP J (though none now and non-tender now). Also describes locking fingers and need to force them back up, though that's not happening today. She states having  CTR in Rt wrist, but also describes tingling in digits 1-3 of both hands, worse in night/evening. She is pre-diabetic and checks her sugars.    PERTINENT HISTORY: seeing PT for cervicalgia.   PRECAUTIONS: Other: cervical issues may be effecting hands, is in PT for cervical  WEIGHT BEARING RESTRICTIONS: No  PAIN:  Are you having pain? Yes: NPRS scale: 3-4/10 at rest now in Rt hand but at times up to 10/10 Pain location: Rt hand 2nd MCP volarly, med nerve distribution b/l  Pain description: burning, stabbing Aggravating factors: strong gripping Relieving factors: rest  FALLS: Has patient fallen in last 6 months? No  LIVING ENVIRONMENT: Lives with: lives with their spouse   PLOF: Independent  PATIENT GOALS: to have less pain in hands    OBJECTIVE: (All objective assessments below are from initial evaluation on: 09/26/22 unless otherwise specified.)   HAND DOMINANCE: Right   ADLs: Overall ADLs: States decreased ability to grab, hold household objects, pain and decreased ability to open containers, perform FMS tasks (manipulate fasteners on clothing   FUNCTIONAL OUTCOME MEASURES: Eval: Quck DASH 27% impairment today  (Higher % Score  =  More Impairment)     UPPER EXTREMITY ROM     Shoulder to Wrist AROM Right eval Left eval  Forearm supination 72 80  Forearm pronation  70 70  Wrist flexion 68 65  Wrist extension 65 80  (Blank rows = not tested)   Hand AROM Right eval Left eval  Full Fist Ability (or Gap to Distal Palmar Crease) full full  Thumb Opposition  (Kapandji Scale)  10 10  (Blank rows = not tested)   UPPER EXTREMITY MMT:    Eval: When strength testing median nerve muscles groups of hand, thumb was 5/5, but Rt IF was painful and had catching sensation.   HAND FUNCTION: Eval: Observed weakness in affected hand.  Grip strength Right: 37 lbs painful between MF & RF and felt neck "pop", Left: 67 lbs no pain   COORDINATION: Eval: Observed coordination  impairments with affected hand. 9 Hole Peg Test Right: 18sec, Left: 20 sec  SENSATION: Eval: States Rt IF less sensitive than Lt, but Lt thumb less sensitive than Rt.  SF's feel similar.   Static 2-P D: Rt Med: 4mm, Lt Med: 4mm, Rt Ulan: 5mm, Lt Ulnar: 4mm    Light touch intact today  EDEMA:   Eval: Very mildly swollen in Rt hand today  COGNITION: Eval: Overall cognitive status: WFL for evaluation today   OBSERVATIONS:   Eval: She presents like tight wrists, tight intrinsic hand muscles, and acute onset of triggering fingers in the right hand (mainly IF) and less so in Left hand. She also presents as median nerve compression in both hands from likely sleep postures, repetitive tasks, and/or other habits.  OT feels that her complaints that pain "switches hands" and lack of clarity on which fingers trigger is due to acuity of  issues and her attempting to compensate/rest when it occurs.    TODAY'S TREATMENT:  Post-evaluation treatment:  Edu to avoid nerve entrapment, sleeping on hands, etc., also edu for management of trigger finger with massage, ice, and PIP J restriction with band-aides as needed. Also given the following nerve movements and exercises to help symptoms, she performs back, stating understanding at least for today.    Exercises - Median Nerve Flossing  - 4-6 x daily - 1 sets - 10-15 reps - Seated Median Nerve Glide  - 3-4 x daily - 5 reps - Wrist Prayer Stretch  - 4 x daily - 3-5 reps - 15 sec hold - HOOK Stretch  - 4 x daily - 3-5 reps - 15-20 sec hold - BACK KNUCKLE STRETCHES   - 4 x daily - 3-5 reps - 15 sec hold - Tendon Glides  - 4-6 x daily - 3-5 reps - 2-3 seconds hold    PATIENT EDUCATION: Education details: See tx section above for details  Person educated: Patient Education method: Verbal Instruction, Teach back, Handouts  Education comprehension: States and demonstrates understanding, Additional Education required   HOME EXERCISE PROGRAM: Access Code:  [email or web address] URL: [email or web address] Date: 09/26/2022 Prepared by: Fannie Knee   GOALS: Goals reviewed with patient? Yes   SHORT TERM GOALS: (STG required if POC>30 days) Target Date: 10/12/22   Pt will demo/state understanding of initial HEP to improve pain levels and prerequisite motion. Goal status: INITIAL   LONG TERM GOALS: Target Date: 11/09/22  Pt will improve functional ability by decreased impairment per Quick DASH assessment from 27% to 15% or better, for better quality of life. Goal status: INITIAL  2.  Pt will improve grip strength in Rt hand from 37lbs to at least 50lbs for functional use at home and in IADLs. Goal status: INITIAL  3.  Pt will improve A/ROM in Rt wrist ext from 65* to at least 70*, to have functional motion for tasks like reach and grasp.  Goal status: INITIAL  4.  Pt will improve strength in Rt IF flexion from painful 3+/5 MMT to at least 4+/5 MMT without pain/locking to have increased functional ability to carry out selfcare and higher-level homecare tasks with no difficulty. Goal status: INITIAL  5.  Pt will decrease pain at worst from 10/10 to 4/10 or better to have better sleep and occupational participation in daily roles. Goal status: INITIAL   ASSESSMENT:  CLINICAL IMPRESSION: Patient is a 57 y.o. female who was seen today for occupational therapy evaluation for bil hand pain, paresthesia, "locking" of fingers, and decreased strength and fnl reports. She will benefit from OP OT to increase quality of life.    PERFORMANCE DEFICITS: in functional skills including ADLs, IADLs, coordination, dexterity, sensation, ROM, strength, pain, fascial restrictions, flexibility, body mechanics, endurance, decreased knowledge of precautions, and UE functional use, cognitive skills including problem solving and safety awareness, and psychosocial skills including coping strategies, environmental adaptation, habits, and routines and behaviors.    IMPAIRMENTS: are limiting patient from ADLs, IADLs, rest and sleep, work, and leisure.   COMORBIDITIES: may have co-morbidities  that affects occupational performance. Patient will benefit from skilled OT to address above impairments and improve overall function.  MODIFICATION OR ASSISTANCE TO COMPLETE EVALUATION: No modification of tasks or assist necessary to complete an evaluation.  OT OCCUPATIONAL PROFILE AND HISTORY: Problem focused assessment: Including review of records relating to presenting problem.  CLINICAL DECISION MAKING: LOW - limited treatment options, no task modification necessary  REHAB POTENTIAL: Good  EVALUATION COMPLEXITY: Low      PLAN:  OT FREQUENCY: 1x/week  OT DURATION: 6 weeks  PLANNED INTERVENTIONS: self care/ADL training, therapeutic exercise, therapeutic activity, neuromuscular re-education, manual therapy, passive range of motion, splinting, paraffin, fluidotherapy, compression bandaging, moist heat, cryotherapy, contrast bath, patient/family education, coping strategies training, DME and/or AE instructions, and Dry needling  RECOMMENDED OTHER SERVICES: is getting PT for cervicalgia   CONSULTED AND AGREED WITH PLAN OF CARE: Patient  PLAN FOR NEXT SESSION:  Review HEP for wrist stiff, hand pain at volar MCP Js/triggering, and mild carpal tunnel syndrome bil.    Fannie Knee, OT 09/26/2022, 6:10 PM

## 2022-09-26 ENCOUNTER — Other Ambulatory Visit: Payer: Self-pay

## 2022-09-26 ENCOUNTER — Ambulatory Visit: Payer: BC Managed Care – PPO | Admitting: Rehabilitative and Restorative Service Providers"

## 2022-09-26 ENCOUNTER — Encounter: Payer: Self-pay | Admitting: Rehabilitative and Restorative Service Providers"

## 2022-09-26 ENCOUNTER — Ambulatory Visit: Payer: BC Managed Care – PPO | Admitting: Physical Therapy

## 2022-09-26 DIAGNOSIS — M542 Cervicalgia: Secondary | ICD-10-CM

## 2022-09-26 DIAGNOSIS — M6281 Muscle weakness (generalized): Secondary | ICD-10-CM | POA: Diagnosis not present

## 2022-09-26 DIAGNOSIS — R202 Paresthesia of skin: Secondary | ICD-10-CM

## 2022-09-26 DIAGNOSIS — M79641 Pain in right hand: Secondary | ICD-10-CM

## 2022-09-26 DIAGNOSIS — M79642 Pain in left hand: Secondary | ICD-10-CM

## 2022-09-26 DIAGNOSIS — M5412 Radiculopathy, cervical region: Secondary | ICD-10-CM

## 2022-09-26 NOTE — Therapy (Signed)
OUTPATIENT PHYSICAL THERAPY CERVICAL TREATMENT   Patient Name: Azhia Siefken MRN: [id number] DOB:02-01-1966, 57 y.o., female Today's Date: 09/26/2022  END OF SESSION:  PT End of Session - 09/26/22 1533     Visit Number 8    Number of Visits 9    Date for PT Re-Evaluation 10/09/22    Authorization Type [employer]    PT Start Time 1530    PT Stop Time 1615    PT Time Calculation (min) 45 min    Activity Tolerance Patient tolerated treatment well    Behavior During Therapy [facility] for tasks assessed/performed                 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   Aortic atherosclerosis 02/07/2022   Arthritis    ASTHMA 01/30/2007   Asthma    Breast tumor    right; benign   Chest pain 01/24/2017   CHEST PAIN 01/29/2009   Qualifier: Diagnosis of  By: Alphonsus Sias MD, Ronnette Hila    Essential hypertension 07/26/2021   GERD 01/31/2007   Hemorrhoids    Hiatal hernia    Hyperlipidemia    Hypersomnolence 12/15/2012   HYPOTHYROIDISM 01/30/2007   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   Neck pain 09/09/2011   Obesity 04/11/2014   Pericardial effusion 02/07/2022   Sleep apnea    Past Surgical History:  Procedure Laterality Date   BREAST BIOPSY Right 12/2014   BREAST EXCISIONAL BIOPSY Right 2016   BREAST LUMPECTOMY WITH RADIOACTIVE SEED LOCALIZATION Right 02/25/2015   Procedure: BREAST LUMPECTOMY WITH RADIOACTIVE SEED LOCALIZATION;  Surgeon: Ovidio Kin, MD;  Location: [facility];  Service: General;  Laterality: Right;   BREAST REDUCTION SURGERY  1986   BREAST SURGERY     broken jaw     CARPAL TUNNEL RELEASE  2001   COLONOSCOPY     FRACTURE SURGERY     REDUCTION MAMMAPLASTY Bilateral    THYROIDECTOMY     TUBAL LIGATION  1987   UPPER GASTROINTESTINAL ENDOSCOPY     Patient Active Problem List   Diagnosis Date Noted   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   Hypoxemia associated with sleep  02/23/2022   Pericardial effusion 02/07/2022   Aortic atherosclerosis 02/07/2022   Essential hypertension 07/26/2021   Hyperlipidemia associated with type 2 diabetes mellitus 08/04/2020   Diet-controlled type 2 diabetes mellitus 07/20/2019   Swollen neck 04/09/2018   Urinary retention 08/05/2017   Atypical chest pain 01/23/2017   Nausea & vomiting 07/16/2015   RUQ abdominal pain 07/16/2015   RLQ abdominal pain 07/15/2015   Pain in lower back 11/30/2014   Acute upper respiratory infection 05/05/2014   Allergic rhinitis 05/05/2014   Hoarseness 05/05/2014   Dysphagia 05/05/2014   Obesity 04/11/2014   Edema, peripheral 04/11/2014   Cough 12/20/2012   Hypersomnolence 12/15/2012   Exertional dyspnea 12/15/2012   Right shoulder pain 12/15/2012   Abdominal pain 11/15/2011   Diabetes mellitus with coincident hypertension 09/09/2011   Neck pain 09/09/2011   Preventative health care 04/30/2011   HOT FLASHES 12/22/2009   CHEST PAIN 01/29/2009   Acute non-recurrent maxillary sinusitis 09/25/2008   HYPERTHYROIDISM 01/31/2007   GERD 01/31/2007   Hypothyroidism 01/30/2007   Hyperlipidemia 01/30/2007   Iron deficiency anemia 01/30/2007   ASTHMA 01/30/2007    PCP: Deeann Saint, MD  REFERRING PROVIDER: Ocie Doyne, MD  REFERRING DIAG: M54.2 (ICD-10-CM) - Cervicalgia  THERAPY DIAG:  Cervicalgia  Radiculopathy, cervical region  Muscle weakness (generalized)  Rationale for Evaluation and Treatment: Rehabilitation  ONSET DATE: 08/03/2022  SUBJECTIVE:                                                                                                                                                                                                         SUBJECTIVE STATEMENT: Pt reports she only had 2 slight headaches since last visit, thinks it is due to sinus pressure. Pt states that her neck feels "fine", not as bad as it usually is. Pt does feel like the TPDN is helpful. Pt  with ongoing thoracic muscle cramps at times that she has had for years, not sure of the cause.  Hand dominance: Right  PERTINENT HISTORY:  DM, HTN, HLD, CAD, hypothyroidism  Per Dr. Delena Bali note 08/03/2022: The patient presents for evaluation of headaches which began one month ago. They are associated with photophobia, no phonophobia or nausea. Headaches are described as right sided pressure and shooting pain which radiates down her neck. She does report tension and stiffness of the neck as well. Pain will occasionally radiate down her arm and she has noticed decreased grip strength on the right. Headaches can last from 20 minutes to 2 hours at a time. She is currently averaging 5 headaches per month. She takes Tylenol or Flexeril as needed which does help.  PAIN:  Are you having pain? No  BP: 125/75  PRECAUTIONS: None  PATIENT GOALS: "improve overall function"   TODAY'S TREATMENT:                                                                                                                               TherAct Trigger Point Dry-Needling  Treatment instructions: Expect mild to moderate muscle soreness. S/S of pneumothorax if dry needled over a lung field, and to seek immediate medical attention should they occur. Patient verbalized understanding of these instructions and education.  Patient Consent Given: Yes Education handout provided: Yes Muscles treated: L and R suboccipitals,  L and R upper traps, R splenius capitus Treatment response/outcome: deep ache/muscle cramp   TherEx Sidelying shoulder abduction x 5 reps B with 15-20 sec hold at end-range, no cramping in thoracic muscles  Seated I, Y, T's x 5 reps each  Seated cervical retraction x 10 reps  Standing plank with rotation at countertop x 5 reps each direction, onset of L-side thoracic mm cramping with rotation to the R  Child's pose on mat table 5 x 30 sec each    PATIENT EDUCATION:  Education details: continue  HEP Person educated: Patient Education method: Explanation, Demonstration, Tactile cues, and Verbal cues Education comprehension: verbalized understanding and needs further education  HOME EXERCISE PROGRAM: Access Code: [email or web address] URL: [email or web address] Date: 09/05/2022 Prepared by: Merry Lofty  Exercises - Seated Scapular Retraction  - 1 x daily - 7 x weekly - 3 sets - 10 reps - Seated Upper Trapezius Stretch  - 1 x daily - 7 x weekly - 3 sets - 30 hold - Seated Levator Scapulae Stretch  - 1 x daily - 7 x weekly - 3 sets - 30 hold - Seated Cervical Flexion Stretch with Finger Support Behind Neck  - 1 x daily - 7 x weekly - 3 sets - 30 hold - Seated Thoracic Self Mobilization  - 1 x daily - 7 x weekly - 3 sets - 30 hold - Single Arm Wall Angel  - 1 x daily - 7 x weekly - 3 sets - 10 reps - Child's Pose Stretch  - 1 x daily - 7 x weekly - 1 sets - 5 reps - 15-30 sec hold - Supine Thoracic Mobilization Towel Roll Vertical with Arm Stretch  - 1 x daily - 7 x weekly - 2 sets - 10 reps - 5 sec hold  ASSESSMENT:  CLINICAL IMPRESSION: Emphasis of skilled PT session on performing TPDN and stretching to address ongoing pain and dysfunction. Pt exhibits decreased pain and sensitivity in her neck and upper shoulder musculature with TPDN. Pt does have ongoing cramping of thoracic musculature with certain movements, but she has had these symptoms for years. Pt continues to benefit from skilled therapy services to ensure she is independent with her HEP prior to d/c from PT services next session. Continue POC.   OBJECTIVE IMPAIRMENTS: decreased activity tolerance, decreased knowledge of condition, decreased ROM, decreased strength, dizziness, impaired perceived functional ability, impaired sensation, impaired UE functional use, postural dysfunction, and pain.   ACTIVITY LIMITATIONS: carrying and lifting  PARTICIPATION LIMITATIONS: meal prep, interpersonal relationship, driving,  community activity, and occupation  PERSONAL FACTORS: 1-2 comorbidities:    DM, HTN, HLD, CAD, hypothyroidismare also affecting patient's functional outcome.   REHAB POTENTIAL: Good  CLINICAL DECISION MAKING: Stable/uncomplicated  EVALUATION COMPLEXITY: Moderate   GOALS: Goals reviewed with patient? Yes  SHORT TERM GOALS=LONG TERM GOALS due to length of POC  LONG TERM GOALS: Target date: 09/28/2022  Pt will be independent with final HEP for improved strength, balance, transfers and gait. Baseline: not established at eval Goal status: INITIAL  2.  Pt will improve cervical ROM by 10 degrees in all planes to demonstrate improved overall function Baseline: see below Active ROM A/PROM (deg) Eval 08/28/2022  Flexion 40 (pain in sternum)  Extension 25 (pain in back of neck)  Right lateral flexion 30 (Pain in R side of neck)  Left lateral flexion 20 (Pain in R side of neck)  Right rotation 38  Left rotation 44 (Pain in R side of neck)  Goal status: INITIAL  3.  Pt will decrease score on Headache Disability Index to 20 points to demonstrate decreased pain and improved function Baseline: 26 Goal status: INITIAL   PLAN:  PT FREQUENCY: 2x/week  PT DURATION: 4 weeks  PLANNED INTERVENTIONS: Therapeutic exercises, Therapeutic activity, Neuromuscular re-education, Balance training, Gait training, Patient/Family education, Self Care, Joint mobilization, Vestibular training, Canalith repositioning, Visual/preceptual remediation/compensation, DME instructions, Aquatic Therapy, Dry Needling, Electrical stimulation, Spinal manipulation, Spinal mobilization, Cryotherapy, Moist heat, Taping, Traction, Manual therapy, and Re-evaluation  PLAN FOR NEXT SESSION:  TPDN, assess LTG and d/c?   Peter Congo, PT, DPT, CSRS 09/26/2022, 4:16 PM

## 2022-09-28 ENCOUNTER — Ambulatory Visit: Payer: BC Managed Care – PPO | Admitting: Physical Therapy

## 2022-09-28 DIAGNOSIS — M542 Cervicalgia: Secondary | ICD-10-CM

## 2022-09-28 DIAGNOSIS — M6281 Muscle weakness (generalized): Secondary | ICD-10-CM

## 2022-09-28 DIAGNOSIS — M5412 Radiculopathy, cervical region: Secondary | ICD-10-CM

## 2022-09-28 NOTE — Therapy (Signed)
OUTPATIENT PHYSICAL THERAPY CERVICAL TREATMENT-DISCHARGE NOTE   Patient Name: Marilyn [name] MRN: [id number] DOB:18-Dec-1965, 57 y.o., female Today's Date: 09/28/2022  PHYSICAL THERAPY DISCHARGE SUMMARY  Visits from Start of Care: 9  Current functional level related to goals / functional outcomes: Independent   Remaining deficits: Decreased cervical ROM   Education / Equipment: Handout for HEP   Patient agrees to discharge. Patient goals were partially met. Patient is being discharged due to being pleased with the current functional level.    END OF SESSION:  PT End of Session - 09/28/22 1446     Visit Number 9    Number of Visits 9    Date for PT Re-Evaluation 10/09/22    Authorization Type [employer]    PT Start Time 1445    PT Stop Time 1525    PT Time Calculation (min) 40 min    Activity Tolerance Patient tolerated treatment well    Behavior During Therapy [facility] for tasks assessed/performed                  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   Neck pain 09/09/2011   Obesity 04/11/2014   Pericardial effusion 02/07/2022   Sleep apnea    Past Surgical History:  Procedure Laterality Date   BREAST BIOPSY Right 12/2014   BREAST EXCISIONAL BIOPSY Right 2016   BREAST LUMPECTOMY WITH RADIOACTIVE SEED LOCALIZATION Right 02/25/2015   Procedure: BREAST LUMPECTOMY WITH RADIOACTIVE SEED LOCALIZATION;  Surgeon: Ovidio Kin, MD;  Location: [facility];  Service:  General;  Laterality: Right;   BREAST REDUCTION SURGERY  1986   BREAST SURGERY     broken jaw     CARPAL TUNNEL RELEASE  2001   COLONOSCOPY     FRACTURE SURGERY     REDUCTION MAMMAPLASTY Bilateral    THYROIDECTOMY     TUBAL LIGATION  1987   UPPER GASTROINTESTINAL ENDOSCOPY     Patient Active Problem List   Diagnosis Date Noted   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   Hypoxemia associated with sleep 02/23/2022   Pericardial effusion 02/07/2022   Aortic atherosclerosis (HCC) 02/07/2022   Essential hypertension 07/26/2021   Hyperlipidemia associated with type 2 diabetes mellitus (HCC) 08/04/2020   Diet-controlled type 2 diabetes mellitus (HCC) 07/20/2019   Swollen neck 04/09/2018   Urinary retention 08/05/2017   Atypical chest pain 01/23/2017   Nausea & vomiting 07/16/2015   RUQ abdominal pain 07/16/2015   RLQ abdominal pain 07/15/2015   Pain in lower back 11/30/2014   Acute upper respiratory infection 05/05/2014   Allergic rhinitis 05/05/2014   Hoarseness 05/05/2014   Dysphagia 05/05/2014   Obesity 04/11/2014   Edema, peripheral 04/11/2014   Cough 12/20/2012   Hypersomnolence 12/15/2012   Exertional dyspnea 12/15/2012   Right shoulder pain 12/15/2012   Abdominal pain 11/15/2011   Diabetes mellitus with coincident hypertension (HCC) 09/09/2011   Neck pain 09/09/2011   Preventative health care  04/30/2011   HOT FLASHES 12/22/2009   CHEST PAIN 01/29/2009   Acute non-recurrent maxillary sinusitis 09/25/2008   HYPERTHYROIDISM 01/31/2007   GERD 01/31/2007   Hypothyroidism 01/30/2007   Hyperlipidemia 01/30/2007   Iron deficiency anemia 01/30/2007   ASTHMA 01/30/2007    PCP: Deeann Saint, MD  REFERRING PROVIDER: Ocie Doyne, MD  REFERRING DIAG: M54.2 (ICD-10-CM) - Cervicalgia  THERAPY DIAG:  Muscle weakness (generalized)  Cervicalgia  Radiculopathy, cervical region  Rationale for Evaluation and Treatment: Rehabilitation  ONSET DATE:  08/03/2022  SUBJECTIVE:                                                                                                                                                                                                         SUBJECTIVE STATEMENT: Pt has only had one small headache since last visit, either related to sinuses or to being tired/hungry, etc. Pt with 2/10 neck pain at rest right now. Pt says her symptoms and pain are "a lot better" since initiation of therapy.  Hand dominance: Right  PERTINENT HISTORY:  DM, HTN, HLD, CAD, hypothyroidism  Per Dr. Delena Bali note 08/03/2022: The patient presents for evaluation of headaches which began one month ago. They are associated with photophobia, no phonophobia or nausea. Headaches are described as right sided pressure and shooting pain which radiates down her neck. She does report tension and stiffness of the neck as well. Pain will occasionally radiate down her arm and she has noticed decreased grip strength on the right. Headaches can last from 20 minutes to 2 hours at a time. She is currently averaging 5 headaches per month. She takes Tylenol or Flexeril as needed which does help.  PAIN:  Are you having pain? No  BP: 125/75  PRECAUTIONS: None  PATIENT GOALS: "improve overall function"   TODAY'S TREATMENT:                                                                                                                               TherAct  Trigger Point Dry-Needling  Treatment instructions: Expect mild to moderate muscle soreness. S/S of pneumothorax if dry needled over a lung field, and to seek immediate medical attention should they occur. Patient verbalized understanding of these instructions and education.  Patient Consent Given: Yes Education handout provided: Yes Muscles treated: L and R suboccipitals, L and R upper traps, R splenius capitus Treatment response/outcome: deep ache/muscle cramp   HDI: 18  CERVICAL ROM:    Active  ROM A/PROM (deg) Eval 08/28/2022 AROM (deg) discharge 09/28/22  Flexion 40 (pain in sternum) 65  Extension 25 (pain in back of neck) 15  Right lateral flexion 30 (Pain in R side of neck) 30  Left lateral flexion 20 (Pain in R side of neck) 32  Right rotation 38 45  Left rotation 44 (Pain in R side of neck) 55   (Blank rows = not tested)   PATIENT EDUCATION:  Education details: continue HEP, d/c from PT Person educated: Patient Education method: Explanation, Demonstration, Tactile cues, and Verbal cues Education comprehension: verbalized understanding  HOME EXERCISE PROGRAM: Access Code: [email or web address] URL: [email or web address] Date: 09/05/2022 Prepared by: Merry Lofty  Exercises - Seated Scapular Retraction  - 1 x daily - 7 x weekly - 3 sets - 10 reps - Seated Upper Trapezius Stretch  - 1 x daily - 7 x weekly - 3 sets - 30 hold - Seated Levator Scapulae Stretch  - 1 x daily - 7 x weekly - 3 sets - 30 hold - Seated Cervical Flexion Stretch with Finger Support Behind Neck  - 1 x daily - 7 x weekly - 3 sets - 30 hold - Seated Thoracic Self Mobilization  - 1 x daily - 7 x weekly - 3 sets - 30 hold - Single Arm Wall Angel  - 1 x daily - 7 x weekly - 3 sets - 10 reps - Child's Pose Stretch  - 1 x daily - 7 x weekly - 1 sets - 5 reps - 15-30 sec hold - Supine Thoracic Mobilization Towel Roll Vertical with Arm Stretch  - 1 x daily - 7 x weekly - 2 sets - 10 reps - 5 sec hold - Supine Lower Trunk Rotation  - 1 x daily - 7 x weekly - 3 sets - 10 reps  ASSESSMENT:  CLINICAL IMPRESSION: Emphasis of skilled PT session on reassessing LTG in preparation for d/c from PT services this date. Pt has met 2/3 LTG due to being independent with her HEP and improving her HDI score from 26 to 18, demonstrating decreased disability level. Pt did improve her cervical ROM in most directions but did exhibit decreased cervical extension due to some pain/tightness in posterior region of neck this  session. Pt agreeable to d/c from OPPT and continue with her HEP.   OBJECTIVE IMPAIRMENTS: decreased activity tolerance, decreased knowledge of condition, decreased ROM, decreased strength, dizziness, impaired perceived functional ability, impaired sensation, impaired UE functional use, postural dysfunction, and pain.   ACTIVITY LIMITATIONS: carrying and lifting  PARTICIPATION LIMITATIONS: meal prep, interpersonal relationship, driving, community activity, and occupation  PERSONAL FACTORS: 1-2 comorbidities:    DM, HTN, HLD, CAD, hypothyroidismare also affecting patient's functional outcome.   REHAB POTENTIAL: Good  CLINICAL DECISION MAKING: Stable/uncomplicated  EVALUATION COMPLEXITY: Moderate   GOALS: Goals reviewed with patient? Yes  SHORT TERM GOALS=LONG TERM GOALS due to length of POC  LONG TERM GOALS: Target date: 09/28/2022  Pt will be independent with final HEP for improved strength, balance, transfers and gait. Baseline:  not established at eval Goal status: MET  2.  Pt will improve cervical ROM by 10 degrees in all planes to demonstrate improved overall function Baseline: see below Active ROM A/PROM (deg) Eval 08/28/2022 AROM (deg) discharge 09/28/22  Flexion 40 (pain in sternum) 65  Extension 25 (pain in back of neck) 15  Right lateral flexion 30 (Pain in R side of neck) 30  Left lateral flexion 20 (Pain in R side of neck) 32  Right rotation 38 45  Left rotation 44 (Pain in R side of neck) 55   (Blank rows = not tested)  Goal status: NOT MET  3.  Pt will decrease score on Headache Disability Index to 20 points to demonstrate decreased pain and improved function Baseline: 26, 18 (4/26) Goal status: MET      Peter Congo, PT, DPT, CSRS 09/28/2022, 3:30 PM

## 2022-10-01 ENCOUNTER — Encounter (HOSPITAL_BASED_OUTPATIENT_CLINIC_OR_DEPARTMENT_OTHER): Payer: Self-pay

## 2022-10-01 DIAGNOSIS — E785 Hyperlipidemia, unspecified: Secondary | ICD-10-CM

## 2022-10-03 ENCOUNTER — Encounter: Payer: Self-pay | Admitting: Oncology

## 2022-10-03 ENCOUNTER — Telehealth: Payer: Self-pay | Admitting: Psychiatry

## 2022-10-03 MED ORDER — ATORVASTATIN CALCIUM 40 MG PO TABS: 40.0000 mg | ORAL_TABLET | Freq: Every day | ORAL | 0 refills | Status: DC

## 2022-10-03 NOTE — Telephone Encounter (Signed)
[facility] Numbers: [id number] exp. 10/03/22-11/01/22 sent to [facility] [phone]

## 2022-10-10 ENCOUNTER — Ambulatory Visit: Payer: BC Managed Care – PPO | Attending: Psychiatry | Admitting: Occupational Therapy

## 2022-10-10 DIAGNOSIS — M6281 Muscle weakness (generalized): Secondary | ICD-10-CM | POA: Insufficient documentation

## 2022-10-10 DIAGNOSIS — M79641 Pain in right hand: Secondary | ICD-10-CM | POA: Diagnosis present

## 2022-10-10 DIAGNOSIS — R29898 Other symptoms and signs involving the musculoskeletal system: Secondary | ICD-10-CM | POA: Diagnosis present

## 2022-10-10 DIAGNOSIS — R278 Other lack of coordination: Secondary | ICD-10-CM | POA: Insufficient documentation

## 2022-10-10 NOTE — Therapy (Addendum)
OUTPATIENT OCCUPATIONAL THERAPY ORTHO TREATMENT  Patient Name: Tannaz Eckard MRN: [id number] DOB:1965-09-20, 57 y.o., female Today's Date: 10/10/2022  PCP: Abbe Amsterdam, MD REFERRING PROVIDER: Ocie Doyne, MD   END OF SESSION:  OT End of Session - 10/10/22 1524     Visit Number 2    Number of Visits 7    Date for OT Re-Evaluation 11/09/22    Authorization Type [employer]    OT Start Time 1528    OT Stop Time 1616    OT Time Calculation (min) 48 min    Activity Tolerance Patient tolerated treatment well;No increased pain    Behavior During Therapy [facility] for tasks assessed/performed;Restless             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   Neck pain 09/09/2011   Obesity 04/11/2014   Pericardial effusion 02/07/2022   Sleep apnea    Past Surgical History:  Procedure Laterality Date   BREAST BIOPSY Right 12/2014   BREAST EXCISIONAL BIOPSY Right 2016   BREAST LUMPECTOMY WITH RADIOACTIVE SEED LOCALIZATION Right 02/25/2015   Procedure: BREAST LUMPECTOMY WITH RADIOACTIVE SEED LOCALIZATION;  Surgeon: Ovidio Kin, MD;  Location: 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   Hypoxemia associated with sleep 02/23/2022   Pericardial effusion 02/07/2022   Aortic atherosclerosis (HCC) 02/07/2022   Essential hypertension 07/26/2021   Hyperlipidemia associated with type 2 diabetes mellitus (HCC) 08/04/2020   Diet-controlled type 2 diabetes mellitus (HCC) 07/20/2019   Swollen neck 04/09/2018   Urinary retention 08/05/2017   Atypical chest pain 01/23/2017   Nausea & vomiting 07/16/2015   RUQ abdominal pain 07/16/2015   RLQ abdominal pain 07/15/2015   Pain in lower back 11/30/2014   Acute upper respiratory infection 05/05/2014   Allergic rhinitis 05/05/2014   Hoarseness 05/05/2014   Dysphagia 05/05/2014   Obesity 04/11/2014   Edema, peripheral 04/11/2014   Cough 12/20/2012   Hypersomnolence 12/15/2012   Exertional dyspnea 12/15/2012   Right shoulder pain 12/15/2012   Abdominal pain 11/15/2011   Diabetes mellitus with coincident hypertension (HCC) 09/09/2011   Neck pain 09/09/2011   Preventative health care 04/30/2011   HOT FLASHES 12/22/2009   CHEST PAIN 01/29/2009   Acute non-recurrent maxillary sinusitis 09/25/2008   HYPERTHYROIDISM 01/31/2007   GERD 01/31/2007   Hypothyroidism 01/30/2007   Hyperlipidemia 01/30/2007   Iron deficiency anemia 01/30/2007   ASTHMA 01/30/2007    ONSET DATE: ~1-2 months onset (acute)   REFERRING DIAG: R29.898 (ICD-10-CM) - Hand  weakness   THERAPY DIAG:  Muscle weakness (generalized)  Pain in right hand  Hand weakness  Rationale for Evaluation and Treatment: Rehabilitation  SUBJECTIVE:   SUBJECTIVE STATEMENT:  Patient went to Urgent Care April 27 and has been on an antibiotic (last dose today) and Prednisone pack which she finished Tuesday.  She was out of work last week and went back this past Monday.  She has been doing her exercises at home over the past week at least 3x/day while she was off work and reports significant improvement in grip today including being able  to make a tight fist for the first time in a long time.  PERTINENT HISTORY: seeing PT for cervicalgia.   PRECAUTIONS: Other: cervical issues may be effecting hands, is in PT for cervical  WEIGHT BEARING RESTRICTIONS: No  PAIN:  Are you having pain? None today in hand but she has occasional twinges  Previously  Yes: NPRS scale: 3-4/10 at rest now in Rt hand but at times up to 10/10 Pain location: Rt hand 2nd MCP volarly, med nerve distribution b/l  Pain description: burning, stabbing Aggravating factors: strong gripping Relieving factors: rest  FALLS: Has patient fallen in last 6 months? No  LIVING ENVIRONMENT: Lives with: lives with their spouse   PLOF: Independent  PATIENT GOALS: to have less pain in hands    OBJECTIVE: (All objective assessments below are from initial evaluation on: 09/26/22 unless otherwise specified.)   HAND DOMINANCE: Right   ADLs: Overall ADLs: States decreased ability to grab, hold household objects, pain and decreased ability to open containers, perform FMS tasks (manipulate fasteners on clothing   FUNCTIONAL OUTCOME MEASURES: Eval: Quck DASH 27% impairment today  (Higher % Score  =  More Impairment)     UPPER EXTREMITY ROM     Shoulder to Wrist AROM Right eval Left eval  Forearm supination 72 80  Forearm pronation  70 70  Wrist flexion 68 65  Wrist extension 65 80  (Blank rows = not tested)   Hand AROM Right eval Left eval  Full Fist Ability (or Gap to Distal Palmar Crease) full full  Thumb Opposition  (Kapandji Scale)  10 10  (Blank rows = not tested)   UPPER EXTREMITY MMT:    Eval: When strength testing median nerve muscles groups of hand, thumb was 5/5, but Rt IF was painful and had catching sensation.   HAND FUNCTION: Eval: Observed weakness in affected hand.  Grip strength Right: 37 lbs painful between MF & RF and felt neck "pop", Left: 67 lbs no pain   10/10/22 Right - 58.8 lbs with no increased pain  Left 60.1 lbs    COORDINATION: Eval: Observed coordination impairments with affected hand. 9 Hole Peg Test Right: 18sec, Left: 20 sec  SENSATION: Eval: States Rt IF less sensitive than Lt, but Lt thumb less sensitive than Rt.  SF's feel similar.   Static 2-P D: Rt Med: 4mm, Lt Med: 4mm, Rt Ulan: 5mm, Lt Ulnar: 4mm    Light touch intact today  EDEMA:   Eval: Very mildly swollen in Rt hand today  COGNITION: Eval: Overall cognitive status: WFL for evaluation today   OBSERVATIONS:   Eval: She presents like tight wrists, tight intrinsic hand muscles, and acute onset of triggering fingers in the right hand (mainly IF) and less so in Left hand. She also presents as median nerve compression in both hands from likely sleep postures, repetitive tasks, and/or other habits.  OT feels that her complaints  that pain "switches hands" and lack of clarity on which fingers trigger is due to acuity of issues and her attempting to compensate/rest when it occurs.    TODAY'S TREATMENT:  Therapeutic Activities: Extensive education re sleep positions, hand coordination and joint protection to minimize hand pain (modified CT protection).  Retested Grip strength 10/10/22 Right - 58.8 lbs with no increased pain (up from 37 lbs at eval)  Sleep position handout provided and education completed re: avoiding flexion of wrist and digits as much as possible to minimize sensory deficits and trigger finger-like incidents.  Coordination activities chosen form list and demonstrated by OT and practiced by patient with cues and guidance for improved techniques.  Tasks included rotating ball in fingertips, shuffling, dealing and sorting cards with BUE, picking up small objects ie) multiple pennies with one hand and placing them in containers, twirling pen in hand etc.  She had good success with each task and is encouraged to do several tasks daily to determine affect on UE discomfort/pain in hand before returning to OT next week.  Reviewed  joint protection for use of stapler at work.  Finally reviewed ideas from Carpal Tunnel Prevention handout with modifications to "Prevent Hand Pain" in general.  Recommendations listed in patient instructions and patient reports good understanding s/p reviewing information as she had forgotten some of the info s/p previous CTS.   PATIENT EDUCATION: Education details: Sleep positions, coordination and pain management (modified Carpal Tunnel protection ideas)  Person educated: Patient Education method: Verbal Instruction, Teach back, Handouts  Education comprehension: States and demonstrates understanding, Additional Education required   HOME EXERCISE PROGRAM: Access Code: [email or web address] URL: [email or web address] Date: 09/26/2022 Prepared by: Fannie Knee Exercises - Median Nerve Flossing  - 4-6 x daily - 1 sets - 10-15 reps - Seated Median Nerve Glide  - 3-4 x daily - 5 reps - Wrist Prayer Stretch  - 4 x daily - 3-5 reps - 15 sec hold - HOOK Stretch  - 4 x daily - 3-5 reps - 15-20 sec hold - BACK KNUCKLE STRETCHES   - 4 x daily - 3-5 reps - 15 sec hold - Tendon Glides  - 4-6 x daily - 3-5 reps - 2-3 seconds hold  10/10/22 - handouts - sleep position, Coordination activities and modified carpal tunnel protection ideas   GOALS: Goals reviewed with patient? Yes   SHORT TERM GOALS: (STG required if POC>30 days) Target Date: 10/12/22   Pt will demo/state understanding of initial HEP to improve pain levels and prerequisite motion. Goal status: In Progress   LONG TERM GOALS: Target Date: 11/09/22  Pt will improve functional ability by decreased impairment per Quick DASH assessment from 27% to 15% or better, for better quality of life. Goal status: INITIAL  2.  Pt will improve grip strength in Rt hand from 37lbs to at least 50lbs for functional use at home and in IADLs. Goal status: In Progress  3.  Pt will improve A/ROM in Rt wrist ext from 65* to at least 70*, to have  functional motion for tasks like reach and grasp.  Goal status: In Progress  4.  Pt will improve strength in Rt IF flexion from painful 3+/5 MMT to at least 4+/5 MMT without pain/locking to have increased functional ability to carry out selfcare and higher-level homecare tasks with no difficulty. Goal status: In Progress  5.  Pt will decrease pain at worst from 10/10 to 4/10 or better to have better sleep and occupational participation in daily  roles. Goal status: In Progress   ASSESSMENT:  CLINICAL IMPRESSION: Patient is a 57 y.o. female who was seen today for visit today s/p OT evaluation for bil hand pain, paresthesia, "locking" of fingers, and decreased strength and fnl reports. She has had significant improvement in grip including able to make tight grip today.  Much of session focussed on education for joint protection and pain management. She will benefit from OP OT to ensure max carryover of HEP and good use of B UEs without pain for max quality of life prior to DC.    PERFORMANCE DEFICITS: in functional skills including ADLs, IADLs, coordination, dexterity, sensation, ROM, strength, pain, fascial restrictions, flexibility, body mechanics, endurance, decreased knowledge of precautions, and UE functional use, cognitive skills including problem solving and safety awareness, and psychosocial skills including coping strategies, environmental adaptation, habits, and routines and behaviors.   IMPAIRMENTS: are limiting patient from ADLs, IADLs, rest and sleep, work, and leisure.   COMORBIDITIES: may have co-morbidities  that affects occupational performance. Patient will benefit from skilled OT to address above impairments and improve overall function.  REHAB POTENTIAL: Good    PLAN:  OT FREQUENCY: 1x/week  OT DURATION: 6 weeks  PLANNED INTERVENTIONS: self care/ADL training, therapeutic exercise, therapeutic activity, neuromuscular re-education, manual therapy, passive range of  motion, splinting, paraffin, fluidotherapy, compression bandaging, moist heat, cryotherapy, contrast bath, patient/family education, coping strategies training, DME and/or AE instructions, and Dry needling  RECOMMENDED OTHER SERVICES: is getting PT for cervicalgia   CONSULTED AND AGREED WITH PLAN OF CARE: Patient  PLAN FOR NEXT SESSION:  Review HEPs and ensure no increased stiffness, pain.  DC may occur prior to full POC if progress is maintained.  Victorino Sparrow, OT 10/10/2022, 4:46 PM

## 2022-10-10 NOTE — Patient Instructions (Signed)
Positioning: Lying on Affected Side    Elevate affected arm: Shoulder is positioned forward. Elbow out and palm turned upward. Position leg: Hip and knee slightly bent. Place other leg on pillow to support weight. May place pillow behind back    Positioning: Lying on Unaffected Side    Elevate affected arm: Shoulder is positioned forward. Elbow straightened as able. Hand with palm down. Position leg: Hip and knee slightly bent. Toes pointing forward. May place another pillow behind back to support trunk position.    Coordination Activities  Perform the following activities for 10-15 minutes 1-3 times per day with right hand(s).  Rotate ball in fingertips (clockwise and counter-clockwise). Flip cards 1 at a time as fast as you can. Deal cards with your thumb (Hold deck in hand and push card off top with thumb). Pick up coins, buttons, marbles, dried beans/pasta of different sizes and place in container. Pick up coins and place in container or coin bank. Pick up coins one at a time until you get 5-10 in your hand, then move coins from palm to fingertips to stack one at a time. Twirl pen between fingers.   Preventing Hand Pain  How can hand pain affect me? Carpal tunnel syndrome can affect your ability to do jobs or activities that involve hand, wrist, and finger action. It can cause symptoms such as: Pain in the wrist, hand, and fingers. Burning, tingling, or numbness in the affected area. A weak feeling in your hands. You may have trouble grabbing and holding items. Symptoms may get worse over time. For some people, symptoms get worse at night.   What actions can I take to help prevent carpal tunnel syndrome and hand pain?     Avoid making repetitive or forceful hand and wrist motions that cause your wrist to bend or get stiff or painful. Take frequent breaks, about every 30 minutes, if you use your hands and wrists for many hours at a time. Avoid sitting for long  stretches of time. Try to get up and move every 30 minutes. Stretch your hands and fingers often to increase blood flow and relieve tension. Keep your wrists in the natural position when using a computer keyboard or mouse. Do not bend your wrists downward or sideways. Arms and shoulders should be relaxed with elbows at your sides. If you use your hands and wrists for many hours at work, make changes to your work space to ease pressure on your wrists. You may want to use: A padded wrist rest for computer work. Use this to lightly rest your wrist and hands when you are not actively keying. A keyboard at a height in which your wrists are straight when typing. You may need to flatten the keyboard or even tilt it away from you. Hand tools with padded handles or work gloves with padding to reduce vibrations. Talk to your health provider about wearing a wrist brace or support. This will not prevent carpal tunnel syndrome but it may keep it from getting worse. A wrist brace may help reduce bending and stress. Closely manage any medical conditions you have that can put you at risk for carpal tunnel syndrome. Have your blood sugar checked to make sure you are not developing diabetes. If you have diabetes, work with your health care provider to keep your blood sugar under control. Physical activity and exercise may help with this condition. Some people find yoga or aerobic exercise helpful. Where to find more information [employer] of Neurological  Disorders and Stroke: [email or web address] American Academy of Family Physicians: familydoctor.[email or web address] Contact a health care provider if: You have numbness or tingling in your wrist, hand, or fingers. You have pain or a burning sensation in your wrist, hand, or fingers. Pain, tingling, or burning wakes you up at night. Your hand becomes weak and clumsy. You frequently drop objects. You are unable to use your wrists and hands without pain. Summary Carpal tunnel  syndrome is a condition that causes pain, numbness, and weakness in the wrist, hand, and fingers. You can take steps to relieve pressure on your wrist and reduce your risk of developing this condition. Avoid making repetitive hand and wrist motions that cause your wrist to get stiff or painful. If you use your hands and wrists for many hours at work, you may want to make changes to your work space to ease pressure on your wrists. Take frequent breaks to stretch your hands and fingers. This information is not intended to replace advice given to you by your health care provider.   Make sure you discuss any questions you have with your health care provider. Document Revised: 11/05/2019 Document Reviewed: 10/01/2019 Elsevier Patient Education  2023 [employer].

## 2022-10-17 ENCOUNTER — Ambulatory Visit: Payer: BC Managed Care – PPO | Admitting: Occupational Therapy

## 2022-10-17 DIAGNOSIS — M79641 Pain in right hand: Secondary | ICD-10-CM

## 2022-10-17 DIAGNOSIS — M6281 Muscle weakness (generalized): Secondary | ICD-10-CM

## 2022-10-17 NOTE — Therapy (Signed)
OUTPATIENT OCCUPATIONAL THERAPY ORTHO TREATMENT  Patient Name: [name] MRN: [id number] DOB:Apr 19, 1966, 57 y.o., female Today's Date: 10/17/2022  PCP: Abbe Amsterdam, MD REFERRING PROVIDER: Ocie Doyne, MD   END OF SESSION:  OT End of Session - 10/17/22 1447     Visit Number 3    Number of Visits 7    Date for OT Re-Evaluation 11/09/22    Authorization Type BC[employer]    OT Start Time 1446    OT Stop Time 1527    OT Time Calculation (min) 41 min    Activity Tolerance Patient tolerated treatment well;No increased pain    Behavior During Therapy [facility] for tasks assessed/performed;Restless             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   Neck pain 09/09/2011   Obesity 04/11/2014   Pericardial effusion 02/07/2022   Sleep apnea    Past Surgical History:  Procedure Laterality Date   BREAST BIOPSY Right 12/2014   BREAST EXCISIONAL BIOPSY Right 2016   BREAST LUMPECTOMY WITH RADIOACTIVE SEED LOCALIZATION Right 02/25/2015   Procedure: BREAST LUMPECTOMY WITH RADIOACTIVE SEED LOCALIZATION;  Surgeon: Ovidio Kin, MD;  Location: [facility];  Service: General;  Laterality: Right;   BREAST REDUCTION SURGERY  1986   BREAST SURGERY     broken jaw     CARPAL TUNNEL RELEASE  2001   COLONOSCOPY     FRACTURE SURGERY     REDUCTION MAMMAPLASTY Bilateral    THYROIDECTOMY     TUBAL LIGATION  1987   UPPER GASTROINTESTINAL ENDOSCOPY     Patient Active Problem List   Diagnosis Date Noted    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   Hypoxemia associated with sleep 02/23/2022   Pericardial effusion 02/07/2022   Aortic atherosclerosis (HCC) 02/07/2022   Essential hypertension 07/26/2021   Hyperlipidemia associated with type 2 diabetes mellitus (HCC) 08/04/2020   Diet-controlled type 2 diabetes mellitus (HCC) 07/20/2019   Swollen neck 04/09/2018   Urinary retention 08/05/2017   Atypical chest pain 01/23/2017   Nausea & vomiting 07/16/2015   RUQ abdominal pain 07/16/2015   RLQ abdominal pain 07/15/2015   Pain in lower back 11/30/2014   Acute upper respiratory infection 05/05/2014   Allergic rhinitis 05/05/2014   Hoarseness 05/05/2014   Dysphagia 05/05/2014   Obesity 04/11/2014   Edema, peripheral 04/11/2014   Cough 12/20/2012   Hypersomnolence 12/15/2012   Exertional dyspnea 12/15/2012   Right shoulder pain 12/15/2012   Abdominal pain 11/15/2011   Diabetes mellitus with coincident hypertension (HCC) 09/09/2011   Neck pain 09/09/2011   Preventative health care 04/30/2011   HOT FLASHES 12/22/2009   CHEST PAIN 01/29/2009   Acute non-recurrent maxillary sinusitis 09/25/2008   HYPERTHYROIDISM 01/31/2007   GERD 01/31/2007   Hypothyroidism 01/30/2007   Hyperlipidemia 01/30/2007   Iron deficiency anemia 01/30/2007   ASTHMA 01/30/2007    ONSET DATE: ~1-2 months onset (acute)   REFERRING DIAG: R29.898 (ICD-10-CM) - Hand  weakness   THERAPY DIAG:  Pain in right hand  Muscle weakness (generalized)  Rationale for Evaluation and Treatment: Rehabilitation  SUBJECTIVE:   SUBJECTIVE STATEMENT:  She has been doing her exercises at home over the past week and is not having any pain with the coordination activities provided to her last week.  PERTINENT HISTORY: completed recent PT for cervicalgia.   PRECAUTIONS: N/A  WEIGHT BEARING RESTRICTIONS: No  PAIN:  Are you having pain? None today in hand but she has had a bit of a headache the last couple of days   FALLS:  Has patient fallen in last 6 months? No  LIVING ENVIRONMENT: Lives with: lives with their spouse   PLOF: Independent  PATIENT GOALS: to have less pain in hands    OBJECTIVE: (All objective assessments below are from initial evaluation on: 09/26/22 unless otherwise specified.)   HAND DOMINANCE: Right   ADLs: Overall ADLs: States decreased ability to grab, hold household objects, pain and decreased ability to open containers, perform FMS tasks (manipulate fasteners on clothing   FUNCTIONAL OUTCOME MEASURES: Eval: Quck DASH 27% impairment today  (Higher % Score  =  More Impairment)    10/17/22 -- 6.8% impairment today    UPPER EXTREMITY ROM     Shoulder to Wrist AROM Right eval Left eval  Forearm supination 72 80  Forearm pronation  70 70  Wrist flexion 68 65  Wrist extension 65 80  (Blank rows = not tested)   Hand AROM Right eval Left eval  Full Fist Ability (or Gap to Distal Palmar Crease) full full  Thumb Opposition  (Kapandji Scale)  10 10  (Blank rows = not tested)   UPPER EXTREMITY MMT:    Eval: When strength testing median nerve muscles groups of hand, thumb was 5/5, but Rt IF was painful and had catching sensation.   HAND FUNCTION: Eval: Observed weakness in affected hand.  Grip strength Right: 37 lbs painful between MF & RF and felt neck "pop", Left: 67 lbs no pain   10/10/22 Right - 58.8 lbs with no increased pain  Left 60.1 lbs   COORDINATION: Eval: Observed coordination impairments with affected hand. 9 Hole Peg Test Right: 18sec, Left: 20 sec  SENSATION: Eval: States Rt IF less sensitive than Lt, but Lt thumb less sensitive than Rt.  SF's feel similar.   Static 2-P D: Rt Med: 4mm, Lt Med: 4mm, Rt Ulan: 5mm, Lt Ulnar: 4mm    Light touch intact today  EDEMA:   Eval: Very mildly swollen in Rt hand today  COGNITION: Eval: Overall cognitive status: WFL for evaluation today   OBSERVATIONS:   Eval: She presents like tight wrists, tight  intrinsic hand muscles, and acute onset of triggering fingers in the right hand (mainly IF) and less so in Left hand. She also presents as median nerve compression in both hands from likely sleep postures, repetitive tasks, and/or other habits.  OT feels that her complaints that pain "switches hands" and lack of clarity on which fingers trigger is due to acuity of issues and her attempting to compensate/rest when it occurs.    TODAY'S TREATMENT:  Therapeutic Activities: Patient is engaged in learning new putty exercises for strengthening and range of motion tasks that she needs to work on including pinching things and picking up things. The following activities were reviewed and modified as needed.   Exercises - Putty Squeezes  - 1 x daily - 2 sets - 10 reps   Encouraged to  squeeze putty into a tube for other pinch activities.  - Tip Pinch with Putty  - 1 x daily - 2 sets - 10 reps  Encouraged to work on repeatedly pulling putty to the side  - Finger Extension with Putty  - 1 x daily - 2 sets - 10 reps  Encouraged to open digits as wide a possible to stretch open her hand Unable to find images for Twisting putty to simulate water bottle cap removal but added that in writing to bottom of list.  Discussed in depth the LESS tips for Joint protection including:  L - Listen to your Body E - Energy Conservation S - Stronger Joints take the Lead S - Strategize   Recommendations under listen to your body includes stopping activities before they reach the point of pain or discomfort, using a brace to support hands and wrist and adapting equipment to make tasks easier.  Patient is encouraged to consider AE as needed such as a bottle/jar opener.   Recommendations under energy conservation include balancing activity and rest, taking breaks and avoiding activities that cannot be stopped.  An example is alternating chores for short durations at a time.   Recommendations under stronger joints take the  lead include using larger joints to conduct tasks with demonstrations provided to patient on how to use her forearm to carry her purse which is quite heavy rather than squeezing the handle with her hand, and demonstration provided in the kitchen re: sliding heavy objects such as a pot along the table/counter and hugging bigger items close to your body with demonstration with a laundry basket ie) holding with open hand around front of basket rather than tight grip on handle.     Recommendations under strategize include working with healthcare provider to find strategies that worked best for him with various assistive devices and modifications provided.  Various adaptive equipment recommendations include consideration of a bottle/jar opener, larger handle L-shaped knife for cutting as well as built-up handles if necessary.      PATIENT EDUCATION: Education details: Putty exercises and Joint protection Person educated: Patient Education method: Engineer, structural, Teach back, Handouts  Education comprehension: States and demonstrates understanding, Additional Education required   HOME EXERCISE PROGRAM: Access Code: [email or web address] URL: [email or web address] Date: 09/26/2022 Prepared by: Fannie Knee Exercises - Median Nerve Flossing  - 4-6 x daily - 1 sets - 10-15 reps - Seated Median Nerve Glide  - 3-4 x daily - 5 reps - Wrist Prayer Stretch  - 4 x daily - 3-5 reps - 15 sec hold - HOOK Stretch  - 4 x daily - 3-5 reps - 15-20 sec hold - BACK KNUCKLE STRETCHES   - 4 x daily - 3-5 reps - 15 sec hold - Tendon Glides  - 4-6 x daily - 3-5 reps - 2-3 seconds hold  10/10/22 - handouts - sleep position, Coordination activities and modified carpal tunnel protection ideas  10/17/22 Access Code: [facility] URL: [email or web address] Date: 10/17/2022 Prepared by: Amada Kingfisher  GOALS: Goals reviewed with patient? Yes   SHORT TERM GOALS: (STG required if POC>30 days) Target  Date: 10/12/22   Pt will demo/state understanding of initial HEP to improve pain levels and prerequisite motion. Goal status: In Progress   LONG TERM GOALS: Target Date: 11/09/22  Pt will improve functional ability by decreased impairment per Quick DASH assessment from 27% to 15% or better, for better quality of life. Goal status: MET  2.  Pt will improve grip strength in Rt  hand from 37lbs to at least 50lbs for functional use at home and in IADLs. Goal status: In Progress  3.  Pt will improve A/ROM in Rt wrist ext from 65* to at least 70*, to have functional motion for tasks like reach and grasp.  Goal status: In Progress  4.  Pt will improve strength in Rt IF flexion from painful 3+/5 MMT to at least 4+/5 MMT without pain/locking to have increased functional ability to carry out selfcare and higher-level homecare tasks with no difficulty. Goal status: In Progress  5.  Pt will decrease pain at worst from 10/10 to 4/10 or better to have better sleep and occupational participation in daily roles. Goal status: In Progress   ASSESSMENT:  CLINICAL IMPRESSION: Patient is a 57 y.o. female who was seen today for 3rd OT visit with considerable improvement in R UE hand pain, paresthesia, and decreased "locking" of fingers. She has had significant improvement in grip including able to make tight grip today.  Much of session focussed on education for joint protection and progression of strengthening activities with putty. She will benefit from a at least 1 further OT session to ensure max carryover of HEP and good use of B UEs without pain for max quality of life prior to DC, with patient aware of possible DC nest week.    PERFORMANCE DEFICITS: in functional skills including ADLs, IADLs, coordination, dexterity, sensation, ROM, strength, pain, fascial restrictions, flexibility, body mechanics, endurance, decreased knowledge of precautions, and UE functional use, cognitive skills including problem  solving and safety awareness, and psychosocial skills including coping strategies, environmental adaptation, habits, and routines and behaviors.   IMPAIRMENTS: are limiting patient from ADLs, IADLs, rest and sleep, work, and leisure.   COMORBIDITIES: may have co-morbidities  that affects occupational performance. Patient will benefit from skilled OT to address above impairments and improve overall function.  REHAB POTENTIAL: Good    PLAN:  OT FREQUENCY: 1x/week  OT DURATION: 6 weeks  PLANNED INTERVENTIONS: self care/ADL training, therapeutic exercise, therapeutic activity, neuromuscular re-education, manual therapy, passive range of motion, splinting, paraffin, fluidotherapy, compression bandaging, moist heat, cryotherapy, contrast bath, patient/family education, coping strategies training, DME and/or AE instructions, and Dry needling  RECOMMENDED OTHER SERVICES: recently completed PT for cervicalgia   CONSULTED AND AGREED WITH PLAN OF CARE: Patient  PLAN FOR NEXT SESSION:   Consider DC from OT - review goals, HEP and ensure she has ideas of AE/splint if necessary for locking fingers if necessary.  Review HEPs and ensure no increased stiffness, pain.     Victorino Sparrow, OT 10/17/2022, 5:16 PM

## 2022-10-24 ENCOUNTER — Ambulatory Visit: Payer: BC Managed Care – PPO | Admitting: Occupational Therapy

## 2022-10-24 DIAGNOSIS — M79641 Pain in right hand: Secondary | ICD-10-CM

## 2022-10-24 DIAGNOSIS — R278 Other lack of coordination: Secondary | ICD-10-CM

## 2022-10-24 DIAGNOSIS — M6281 Muscle weakness (generalized): Secondary | ICD-10-CM | POA: Diagnosis not present

## 2022-10-24 DIAGNOSIS — R29898 Other symptoms and signs involving the musculoskeletal system: Secondary | ICD-10-CM

## 2022-10-24 NOTE — Therapy (Signed)
OUTPATIENT OCCUPATIONAL THERAPY ORTHO TREATMENT & DISCHARGE SUMMARY  Patient Name: Marilyn [name] MRN: [id number] DOB:04-07-1966, 57 y.o., female Today's Date: 10/24/2022  OCCUPATIONAL THERAPY DISCHARGE SUMMARY  Visits from Start of Care: 4  Current functional level related to goals / functional outcomes:   Patient has met 1/1 short-term goals and 5/5 long-term goals to date.   Remaining deficits: R grip strength still slightly less than L (but only 1.3 lbs)   Education / Equipment:  HEP - coordination/tendon/nerve gliding & putty, Joint protection and sleep positions  Patient agrees to discharge. Patient goals were met. Patient is being discharged due to meeting the stated rehab goals.[name]     PCP: Abbe Amsterdam, MD REFERRING PROVIDER: Ocie Doyne, MD   END OF SESSION:  OT End of Session - 10/24/22 1441     Visit Number 4    Number of Visits 7    Date for OT Re-Evaluation 11/09/22    Authorization Type [employer]    OT Start Time 1445    OT Stop Time 1530    OT Time Calculation (min) 45 min    Activity Tolerance Patient tolerated treatment well;No increased pain    Behavior During Therapy [facility] for tasks assessed/performed;Restless             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   Neck pain 09/09/2011   Obesity 04/11/2014   Pericardial effusion 02/07/2022   Sleep apnea    Past Surgical History:  Procedure Laterality Date   BREAST BIOPSY Right 12/2014   BREAST EXCISIONAL BIOPSY Right 2016    BREAST LUMPECTOMY WITH RADIOACTIVE SEED LOCALIZATION Right 02/25/2015   Procedure: BREAST LUMPECTOMY WITH RADIOACTIVE SEED LOCALIZATION;  Surgeon: Ovidio Kin, MD;  Location: [facility];  Service: General;  Laterality: Right;   BREAST REDUCTION SURGERY  1986   BREAST SURGERY     broken jaw     CARPAL TUNNEL RELEASE  2001   COLONOSCOPY     FRACTURE SURGERY     REDUCTION MAMMAPLASTY Bilateral    THYROIDECTOMY     TUBAL LIGATION  1987   UPPER GASTROINTESTINAL ENDOSCOPY     Patient Active Problem List   Diagnosis Date Noted   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   Hypoxemia associated with sleep 02/23/2022   Pericardial effusion 02/07/2022   Aortic atherosclerosis (HCC) 02/07/2022   Essential hypertension 07/26/2021   Hyperlipidemia associated with type 2 diabetes mellitus (HCC) 08/04/2020   Diet-controlled type 2 diabetes mellitus (HCC) 07/20/2019   Swollen neck 04/09/2018   Urinary retention 08/05/2017   Atypical chest pain 01/23/2017   Nausea & vomiting 07/16/2015   RUQ abdominal pain 07/16/2015   RLQ abdominal pain 07/15/2015   Pain in lower back 11/30/2014   Acute upper respiratory infection 05/05/2014   Allergic rhinitis 05/05/2014   Hoarseness 05/05/2014   Dysphagia 05/05/2014   Obesity 04/11/2014   Edema, peripheral 04/11/2014   Cough 12/20/2012   Hypersomnolence 12/15/2012  Exertional dyspnea 12/15/2012   Right shoulder pain 12/15/2012   Abdominal pain 11/15/2011   Diabetes mellitus with coincident hypertension (HCC) 09/09/2011   Neck pain 09/09/2011   Preventative health care 04/30/2011   HOT FLASHES 12/22/2009   CHEST PAIN 01/29/2009   Acute non-recurrent maxillary sinusitis 09/25/2008   HYPERTHYROIDISM 01/31/2007   GERD 01/31/2007   Hypothyroidism 01/30/2007   Hyperlipidemia 01/30/2007   Iron deficiency anemia 01/30/2007   ASTHMA 01/30/2007    ONSET DATE: ~1-2 months onset (acute)   REFERRING DIAG: R29.898 (ICD-10-CM) - Hand  weakness   THERAPY DIAG:  No diagnosis found.  Rationale for Evaluation and Treatment: Rehabilitation  SUBJECTIVE:   SUBJECTIVE STATEMENT:  She has been doing her exercises at home, sleeping while hugging a pillow to support her arm and has not had any pain this past week.  PERTINENT HISTORY: completed recent PT for cervicalgia.   PRECAUTIONS: N/A  WEIGHT BEARING RESTRICTIONS: No  PAIN:  Are you having pain? None today in hand and none over the past week either   FALLS: Has patient fallen in last 6 months? No  LIVING ENVIRONMENT: Lives with: lives with their spouse   PLOF: Independent  PATIENT GOALS: to have less pain in hands    OBJECTIVE: (All objective assessments below are from initial evaluation on: 09/26/22 unless otherwise specified.)   HAND DOMINANCE: Right   ADLs: Overall ADLs: States decreased ability to grab, hold household objects, pain and decreased ability to open containers, perform FMS tasks (manipulate fasteners on clothing   FUNCTIONAL OUTCOME MEASURES: Eval: Quck DASH 27% impairment today  (Higher % Score  =  More Impairment)    10/24/22 -- 0.0% impairment today    UPPER EXTREMITY ROM     Shoulder to Wrist AROM Right eval Right DC 5/22 Left eval  Forearm supination 72 80 80  Forearm pronation  70 70 70  Wrist flexion 68 75 65  Wrist extension 65 80 80  (Blank rows = not tested)   Hand AROM Right eval Left eval  Full Fist Ability (or Gap to Distal Palmar Crease) full full  Thumb Opposition  (Kapandji Scale)  10 10  (Blank rows = not tested)   UPPER EXTREMITY MMT:    Eval: When strength testing median nerve muscles groups of hand, thumb was 5/5, but Rt IF was painful and had catching sensation.   HAND FUNCTION: Eval: Observed weakness in affected hand.  Grip strength Right: 37 lbs painful between MF & RF and felt neck "pop", Left: 67 lbs no pain   10/10/22 Right - 58.8 lbs with no increased pain  Left 60.1 lbs    COORDINATION: Eval: Observed coordination impairments with affected hand. 9 Hole Peg Test Right: 18sec, Left: 20 sec  SENSATION: Eval: States Rt IF less sensitive than Lt, but Lt thumb less sensitive than Rt.  SF's feel similar.   Static 2-P D: Rt Med: 4mm, Lt Med: 4mm, Rt Ulan: 5mm, Lt Ulnar: 4mm    Light touch intact today  EDEMA:   Eval: Very mildly swollen in Rt hand today  COGNITION: Eval: Overall cognitive status: WFL for evaluation today   OBSERVATIONS:   Eval: She presents like tight wrists, tight intrinsic hand muscles, and acute onset of triggering fingers in the right hand (mainly IF) and less so in Left hand. She also presents as median nerve compression in both hands from likely sleep postures, repetitive tasks, and/or other habits.  OT feels that her complaints that pain "switches hands"  and lack of clarity on which fingers trigger is due to acuity of issues and her attempting to compensate/rest when it occurs.    TODAY'S TREATMENT:  Therapeutic Activities: Patient is engaged in reviewing HEPs/coordination activities as well as joint protection recommendations and options to help maintain current gains with patient in agreement with DC from skilled OT today due to all goals met.     Patient has and is comfortable with tendon/nerve gliding activities, putty exercises, coordination activities and is encouraged to do things daily to maintain good mobility in R UE, continue to progress strength and adhere to joint protection.  Education and sample images provided re: commercially available splints to help with locking of digits if this happens in the future.  Reviewed the the LESS tips for Joint protection per handouts and detailed infor in last week's note  L - Listen to your Body E - Energy Conservation S - Stronger Joints take the Lead S - Strategize      PATIENT EDUCATION: Education details: DC instructions: re: HE, Putty exercises and Joint protection Person  educated: Patient Education method: Verbal Instruction, Teach back, Handouts  Education comprehension: States and demonstrates understanding  HOME EXERCISE PROGRAM: Access Code: [email or web address] URL: [email or web address] Date: 09/26/2022 Prepared by: Fannie Knee Exercises - Median Nerve Flossing  - 4-6 x daily - 1 sets - 10-15 reps - Seated Median Nerve Glide  - 3-4 x daily - 5 reps - Wrist Prayer Stretch  - 4 x daily - 3-5 reps - 15 sec hold - HOOK Stretch  - 4 x daily - 3-5 reps - 15-20 sec hold - BACK KNUCKLE STRETCHES   - 4 x daily - 3-5 reps - 15 sec hold - Tendon Glides  - 4-6 x daily - 3-5 reps - 2-3 seconds hold  10/10/22 - handouts - sleep position, Coordination activities and modified carpal tunnel protection ideas  10/17/22 Access Code: [facility] URL: [email or web address] Date: 10/17/2022 Prepared by: Amada Kingfisher  GOALS: Goals reviewed with patient? Yes   SHORT TERM GOALS: (STG required if POC>30 days) Target Date: 10/12/22   Pt will demo/state understanding of initial HEP to improve pain levels and prerequisite motion. Goal status: MET   LONG TERM GOALS: Target Date: 11/09/22  Pt will improve functional ability by decreased impairment per Quick DASH assessment from 27% to 15% or better, for better quality of life. Goal status: MET - 0% at DC  2.  Pt will improve grip strength in Rt hand from 37lbs to at least 50lbs for functional use at home and in IADLs. Goal status: MET - R 58.8 lbs  3.  Pt will improve A/ROM in Rt wrist ext from 65* to at least 70*, to have functional motion for tasks like reach and grasp.  Goal status: MET - ROM equal R and L  4.  Pt will improve strength in Rt IF flexion from painful 3+/5 MMT to at least 4+/5 MMT without pain/locking to have increased functional ability to carry out selfcare and higher-level homecare tasks with no difficulty. Goal status: MET - no pain  5.  Pt will decrease pain at worst from  10/10 to 4/10 or better to have better sleep and occupational participation in daily roles. Goal status: MET 0/10, using pillow for support at night   ASSESSMENT:  CLINICAL IMPRESSION: Patient is seen today for her last OT visit as she has had no pain with any exercises or activities over the past week, continues to not experience any "locking"  of fingers and has met 6/6 goals. She has:  --Equal Left and right wrist range of motion --No pain with manual muscle testing --No catching in any movements with hand or fingers --20 pound improvement in right grip strength although still slightly less than left upper extremity --Timely coordination tests with no impairments in motions as noted at eval  Session focussed on discharge instructions ie) reviewing HEP and joint protection for carryover up on DC from OT today with patient comfortable and confident in her ability to carryover tasks, aware of modifications and available splints as needed for wrist or fingers if issues arise ie) locking/catching etc.   REHAB POTENTIAL: Good    PLAN:  Discharge OT services today s/p reviewing goals, and she has met all 6/6 goals. HEP reviewed and she is aware of AE/splint if necessary for locking fingers.   Patient is appropriate for discharge and no longer demonstrates medical necessity for continued skilled occupational therapy services.    Victorino Sparrow, OT 10/24/2022, 3:33 PM

## 2022-10-30 ENCOUNTER — Other Ambulatory Visit: Payer: Self-pay | Admitting: Family Medicine

## 2022-10-30 DIAGNOSIS — E119 Type 2 diabetes mellitus without complications: Secondary | ICD-10-CM

## 2022-10-31 ENCOUNTER — Ambulatory Visit: Payer: BC Managed Care – PPO | Admitting: Occupational Therapy

## 2022-11-05 ENCOUNTER — Encounter (HOSPITAL_BASED_OUTPATIENT_CLINIC_OR_DEPARTMENT_OTHER): Payer: Self-pay | Admitting: *Deleted

## 2022-11-05 ENCOUNTER — Encounter (HOSPITAL_BASED_OUTPATIENT_CLINIC_OR_DEPARTMENT_OTHER): Payer: Self-pay | Admitting: Cardiovascular Disease

## 2022-11-05 ENCOUNTER — Ambulatory Visit (INDEPENDENT_AMBULATORY_CARE_PROVIDER_SITE_OTHER): Payer: BC Managed Care – PPO | Admitting: Cardiovascular Disease

## 2022-11-05 VITALS — BP 117/75 | HR 89 | Ht 63.0 in | Wt 196.6 lb

## 2022-11-05 DIAGNOSIS — I700 Atherosclerosis of aorta: Secondary | ICD-10-CM

## 2022-11-05 DIAGNOSIS — I10 Essential (primary) hypertension: Secondary | ICD-10-CM | POA: Diagnosis not present

## 2022-11-05 DIAGNOSIS — I3139 Other pericardial effusion (noninflammatory): Secondary | ICD-10-CM

## 2022-11-05 DIAGNOSIS — E785 Hyperlipidemia, unspecified: Secondary | ICD-10-CM

## 2022-11-05 DIAGNOSIS — G4733 Obstructive sleep apnea (adult) (pediatric): Secondary | ICD-10-CM

## 2022-11-05 DIAGNOSIS — E1169 Type 2 diabetes mellitus with other specified complication: Secondary | ICD-10-CM

## 2022-11-05 NOTE — Progress Notes (Signed)
Cardiology Office Note:    Date:  11/05/2022   ID:  Marilyn [name], DOB 02/21/1966, MRN [id number]  PCP:  Deeann Saint, MD  Cardiologist:  Chilton Si, MD    Referring MD: Deeann Saint, MD   No chief complaint on file.   History of Present Illness:    Marilyn [name] is a 57 y.o. female with a hx of hypertension, hyperlipidemia, aortic atherosclerosis, OSA on CPAP, and obesity here today for follow-up. She was initially seen 07/26/2021 for the evaluation of pericardial effusion at the request of Dr. Salomon Fick.   She presented to the ED 07/2021 for precordial pain. She was at work when she developed aching L arm pain and swelling.  She had COVID-19 one month prior.  Chest CTA 07/2021 revealed trace pericardial effusion and aortic arthrosclerosis. She reported exertional dyspnea. On her chest CT there was no evidence of coronary calcification and she was negative for PE. She had an Echo  08/2021 that revealed LVEF 60-65% with mild LVH and grade 1 diastolic dysfunction. She had a small pericardial effusion but no tamponade. She followed up with Gillian Shields, NP and reported palpitations. She wore a monitor that showed rare PACs and PVCs. She was referred for a PYP scan 03/2022 that were equivocal for amyloidosis. She was started on Atorvastatin due to coronary calcification.  She was diagnosed with OSA 02/2022.  [name] reports intermittent chest discomfort described as a 'sting' or 'pinch,' which occurs at rest and is not associated with exertion. The patient also reports occasional ankle swelling, which she attributes to footwear. She has noticed improvement in hand swelling and mobility following treatment by a neurologist.  She also reports a persistent issue with a swelling in the neck, which has not been resolved despite previous consultations. She has been using a CPAP machine for sleep apnea, but has recently had a sinus infection which has disrupted her use of the  machine. She reports feeling better when using the machine, but has noticed issues with it, such as water leakage and it coming off during sleep.  [name] has been trying to increase her exercise, but this has been disrupted by illness and work stress. She reports a high-stress environment at work, which has been causing emotional distress. She has been monitoring her blood pressure at home, which has been stable, but has noticed an increase in her glucose levels.  The patient has been trying to manage her health through lifestyle changes, including exercise and diet, with the goal of reducing her weight and BMI to avoid medication. She has been monitoring her heart rate during exercise and has expressed concerns about it going too high. She has also expressed interest in an alternative to the CPAP machine, the Inspire device.      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   Neck pain 09/09/2011   Obesity 04/11/2014   Pericardial effusion 02/07/2022   Sleep apnea  Past Surgical History:  Procedure Laterality Date   BREAST BIOPSY Right 12/2014   BREAST EXCISIONAL BIOPSY Right 2016   BREAST LUMPECTOMY WITH RADIOACTIVE SEED LOCALIZATION Right 02/25/2015   Procedure: BREAST LUMPECTOMY WITH RADIOACTIVE SEED LOCALIZATION;  Surgeon: Ovidio Kin, MD;  Location: [facility];  Service: General;  Laterality: Right;   BREAST REDUCTION SURGERY  1986   BREAST SURGERY     broken jaw     CARPAL TUNNEL RELEASE  2001    COLONOSCOPY     FRACTURE SURGERY     REDUCTION MAMMAPLASTY Bilateral    THYROIDECTOMY     TUBAL LIGATION  1987   UPPER GASTROINTESTINAL ENDOSCOPY      Current Medications: Current Meds  Medication Sig   acetaminophen (TYLENOL) 500 MG tablet Take 1 tablet (500 mg total) by mouth every 6 (six) hours as needed.   albuterol (VENTOLIN HFA) 108 (90 Base) MCG/ACT inhaler INHALE 2 PUFFS BY MOUTH EVERY 4 HOURS AS NEEDED FOR WHEEZING,COUGHING OR FOR SHORTNESS OF BREATH   amLODipine (NORVASC) 10 MG tablet TAKE 1 TABLET BY MOUTH DAILY   aspirin EC 81 MG tablet Take 1 tablet (81 mg total) by mouth daily. Swallow whole.   atorvastatin (LIPITOR) 40 MG tablet Take 1 tablet (40 mg total) by mouth daily.   Beclomethasone Dipropionate 40 MCG/ACT AERS Place 1 spray into the nose in the morning and at bedtime.   Blood Glucose Monitoring Suppl (ONETOUCH VERIO FLEX SYSTEM) w/Device KIT Use to check blood glucose TID or prn   Blood Pressure Monitoring (COMFORT TOUCH BP CUFF/LARGE) MISC Please measure blood pressure 1 time per day 1-2 hours after taking medication and resting for 5-10 minutes, please keep a log and bring to all future appointments. DX I10   cetirizine (ZYRTEC) 10 MG tablet Take 1 tablet (10 mg total) by mouth daily.   Cholecalciferol (VITAMIN D) 125 MCG (5000 UT) CAPS Take 1 tablet by mouth daily.   cyclobenzaprine (FLEXERIL) 5 MG tablet Take 1 tablet (5 mg total) by mouth 3 (three) times daily as needed.   EPIPEN 2-PAK 0.3 MG/0.3ML SOAJ injection Inject 0.3 mg into the muscle as needed for anaphylaxis.   gabapentin (NEURONTIN) 300 MG capsule Take 1 capsule (300 mg total) by mouth at bedtime.   glucose blood (ONETOUCH VERIO) test strip TEST AS DIRECTED THREE TIMES A DAY   ibuprofen (ADVIL) 600 MG tablet Take 1 tablet (600 mg total) by mouth every 6 (six) hours as needed.   Lancets (ONETOUCH DELICA PLUS LANCET30G) MISC Check  3 times daily.   levothyroxine (SYNTHROID) 100 MCG tablet TAKE 1 TABLET  BY MOUTH DAILY   metoprolol succinate (TOPROL-XL) 25 MG 24 hr tablet TAKE 1 TABLET BY MOUTH DAILY   omeprazole (PRILOSEC) 40 MG capsule Take 1 capsule (40 mg total) by mouth 2 (two) times daily.   ondansetron (ZOFRAN-ODT) 4 MG disintegrating tablet Take 1 tablet (4 mg total) by mouth every 8 (eight) hours as needed for nausea or vomiting.   vitamin B-12 (CYANOCOBALAMIN) 500 MCG tablet Take 500 mcg by mouth daily.     Allergies:   Promethazine-dm, [place] allergy], Codeine, Fluticasone propionate, Promethazine, and Flonase [fluticasone propionate]   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[employer]  Employer: [employer]  Tobacco Use   Smoking status: Former    Packs/day: 0.25    Years: 3.00    Additional pack years: 0.00    Total pack years: 0.75    Types: Cigarettes    Quit date: 06/04/1980    Years since quitting: 42.4   Smokeless tobacco: Never  Vaping Use   Vaping Use: Never used  Substance and Sexual Activity   Alcohol use: No    Alcohol/week: 0.0 standard drinks of alcohol   Drug use: No   Sexual activity: Not Currently    Comment: 1st intercourse 12 yo-5 partners  Other Topics Concern   Not on file  Social History Narrative   Married   Social Determinants of Health   Financial Resource Strain: Low Risk  (07/26/2021)   Overall Financial Resource Strain (CARDIA)    Difficulty of Paying Living Expenses: Not hard at all  Food Insecurity: No Food Insecurity (07/26/2021)   Hunger Vital Sign    Worried About Running Out of Food in the Last Year: Never true    Ran Out of Food in the Last Year: Never true  Transportation Needs: No Transportation Needs (07/26/2021)   PRAPARE - Administrator, Civil Service (Medical): No    Lack of Transportation (Non-Medical):  No  Physical Activity: Insufficiently Active (07/26/2021)   Exercise Vital Sign    Days of Exercise per Week: 2 days    Minutes of Exercise per Session: 40 min  Stress: Not on file  Social Connections: Not on file     Family History: The patient's family history includes Asthma in her maternal grandfather and mother; Breast cancer in her maternal aunt; Breast cancer (age of onset: 37) in her sister; Colon cancer in her brother; Diabetes in her father and maternal grandmother; Heart attack in her maternal grandfather; Heart attack (age of onset: 74) in her maternal grandmother; Heart disease in her maternal grandmother; Hyperlipidemia in her father; Hypertension in her father; Migraines in her daughter; Stroke in her maternal grandfather and maternal grandmother; Thyroid disease in her sister. There is no history of Esophageal cancer, Stomach cancer, or Rectal cancer.  ROS:   Please see the history of present illness.    + Palpitations + RLE pain + BLE swelling + Difficulty swallowing + Memory difficulty + Tremors All other systems reviewed and negative.   EKGs/Labs/Other Studies Reviewed:    The following studies were reviewed today:  Myocardial Amyloid Imaging 03/05/22:   By semi-quantitative assessment scan is consistent with increased heart uptake but less than rib uptake-Grade 1. Heart to contralateral lung ratio is between 1-1.5, indeterminate for amyloid.   Study is equivocal for TTR amyloidosis (visual score of 1/ratio between 1-1.5).   Prior study not available for comparison. Conclusion: Equivocal for TTR amyloidosis (Equivocal: A semi-quantitative visual score of 1 or H/CL ratio 1-1.5)  Monitor  12/2021: 14 Day Event Monitor Quality: Fair.  Baseline artifact. Predominant rhythm: Sinus rhythm Average heart rate: 89 bpm Max heart rate: 140 bpm Min heart rate: 56 bpm Pauses >2.5 seconds: None Rare PACs.  1.2% PVCs occasional ventricular couplets, ventricular bigeminy and  ventricular trigeminy  Echo  08/03/2021: Sonographer Comments: Technically difficult study due to poor echo windows and patient is morbidly obese. Image acquisition challenging due to patient body habitus and Image acquisition challenging due to respiratory motion.   1. Left ventricular ejection fraction, by estimation, is 60 to 65%. The  left ventricle has normal function. The  left ventricle has no regional  wall motion abnormalities. There is mild concentric left ventricular  hypertrophy. Left ventricular diastolic  parameters are consistent with Grade I diastolic dysfunction (impaired  relaxation).   2. Right ventricular systolic function is normal. The right ventricular  size is normal.   3. Left atrial size was mildly dilated.   4. A small pericardial effusion is present. The pericardial effusion is  circumferential.   5. The mitral valve is normal in structure. Mild mitral valve  regurgitation. No evidence of mitral stenosis.   6. The aortic valve is tricuspid. There is mild calcification of the  aortic valve. There is mild thickening of the aortic valve. Aortic valve  regurgitation is trivial. Aortic valve sclerosis/calcification is present,  without any evidence of aortic  stenosis.   7. The inferior vena cava is normal in size with greater than 50%  respiratory variability, suggesting right atrial pressure of 3 mmHg.   Upper Venous Study 07/07/21 Right:  No evidence of thrombosis in the subclavian.  Left:  No evidence of deep vein thrombosis in the upper extremity. No evidence of superficial vein thrombosis in the upper extremity.   CTA Chest 07/05/21 IMPRESSION: 1. No evidence of pulmonary embolus. 2. Trace pericardial effusion. 3. Minimal sigmoid diverticulosis without diverticulitis. 4. Fibroid uterus. 5.  Aortic Atherosclerosis (ICD10-I70.0).  EKG:  EKG was not ordered today, 05/14/22. 02/07/2022:  EKG was not ordered. 11/17/2021 Gillian Shields, NP):  ST 108 bpm with  PV's. No acute ST/T wave changes.   Recent Labs: 11/17/2021: Magnesium 2.4 01/31/2022: B Natriuretic Peptide 4.7 06/07/2022: Hemoglobin 12.6; Platelets 230 08/03/2022: ALT 19; BUN 10; Creatinine, Ser 0.89; Potassium 4.4; Sodium 141; TSH 0.58   Recent Lipid Panel    Component Value Date/Time   CHOL 139 08/03/2022 1302   TRIG 82 08/03/2022 1302   HDL 38 (L) 08/03/2022 1302   CHOLHDL 3.7 08/03/2022 1302   CHOLHDL 4 07/17/2019 0732   VLDL 17.8 07/17/2019 0732   LDLCALC 85 08/03/2022 1302   LDLDIRECT 165.3 12/15/2012 1153    Physical Exam:    VS:  BP 117/75 (BP Location: Right Arm, Patient Position: Sitting, Cuff Size: Large)   Pulse 89   Ht 5\' 3"  (1.6 m)   Wt 196 lb 9.6 oz (89.2 kg)   LMP 02/21/2013   SpO2 96%   BMI 34.83 kg/m  , BMI Body mass index is 34.83 kg/m. GENERAL:  Well appearing HEENT: Pupils equal round and reactive, fundi not visualized, oral mucosa unremarkable NECK:  No jugular venous distention, waveform within normal limits, carotid upstroke brisk and symmetric, no bruits, no thyromegaly LUNGS:  Clear to auscultation bilaterally HEART:  RRR.  PMI not displaced or sustained,S1 and S2 within normal limits, no S3, no S4, no clicks, no rubs, no murmurs ABD:  Flat, positive bowel sounds normal in frequency in pitch, no bruits, no rebound, no guarding, no midline pulsatile mass, no hepatomegaly, no splenomegaly EXT:  2 plus pulses throughout, no edema, no cyanosis no clubbing SKIN:  No rashes no nodules NEURO:  Cranial nerves II through XII grossly intact, motor grossly intact throughout PSYCH:  Cognitively intact, oriented to person place and time   ASSESSMENT:    1. Essential hypertension   2. Pericardial effusion   3. Aortic atherosclerosis (HCC)   4. Hyperlipidemia associated with type 2 diabetes mellitus (HCC)   5. OSA (obstructive sleep apnea)       PLAN:       Non-obstructive CAD:  Chest Discomfort:  Hyperlipidemia: Intermittent, brief,  non-exertional chest discomfort. Likely non-cardiac given the nature of the symptoms and the patient's stable cardiac history. -No repeat ischemia evaluation at this time.   -Continue aspirin and statin -Check lipids/CMP today  Sleep Apnea:  Patient has been non-compliant with CPAP therapy for the past 2-3 weeks due to a sinus infection and possible device malfunction. -Resume CPAP therapy as soon as possible. -Consider having the device checked for possible malfunction. -ENT referral for Inspire device consultation  Exercise and Weight Management: Patient is trying to increase physical activity and manage weight. -Encourage continuation of exercise regimen and healthy diet to manage weight and improve overall health.        Disposition: f/u in 6 months  Medication Adjustments/Labs and Tests Ordered: Current medicines are reviewed at length with the patient today.  Concerns regarding medicines are outlined above.   Orders Placed This Encounter  Procedures   Ambulatory referral to ENT   No orders of the defined types were placed in this encounter.     Signed, Chilton Si, MD  11/05/2022 9:43 AM    [facility]

## 2022-11-05 NOTE — Patient Instructions (Signed)
Medication Instructions:  Your physician recommends that you continue on your current medications as directed. Please refer to the Current Medication list given to you today.   *If you need a refill on your cardiac medications before your next appointment, please call your pharmacy*  Lab Work: NONE  Testing/Procedures: NONE  Follow-Up: At [facility], you and your health needs are our priority.  As part of our continuing mission to provide you with exceptional heart care, we have created designated Provider Care Teams.  These Care Teams include your primary Cardiologist (physician) and Advanced Practice Providers (APPs -  Physician Assistants and Nurse Practitioners) who all work together to provide you with the care you need, when you need it.  We recommend signing up for the patient portal called "MyChart".  Sign up information is provided on this After Visit Summary.  MyChart is used to connect with patients for Virtual Visits (Telemedicine).  Patients are able to view lab/test results, encounter notes, upcoming appointments, etc.  Non-urgent messages can be sent to your provider as well.   To learn more about what you can do with MyChart, go to [email or web address].    Your next appointment:   12 month(s)  Provider:   Chilton Si, MD or Gillian Shields, NP    Other Instructions You have been referred to DR [facility]AK[facility] E[facility] FOR INSPIRA DEVICE DISCUSSION

## 2022-11-06 ENCOUNTER — Telehealth (HOSPITAL_BASED_OUTPATIENT_CLINIC_OR_DEPARTMENT_OTHER): Payer: Self-pay

## 2022-11-06 DIAGNOSIS — E785 Hyperlipidemia, unspecified: Secondary | ICD-10-CM

## 2022-11-06 LAB — HEPATIC FUNCTION PANEL
ALT: 18 IU/L (ref 0–32)
AST: 20 IU/L (ref 0–40)
Albumin: 4.4 g/dL (ref 3.8–4.9)
Alkaline Phosphatase: 83 IU/L (ref 44–121)
Bilirubin Total: 0.3 mg/dL (ref 0.0–1.2)
Bilirubin, Direct: <0.1 mg/dL (ref 0.00–0.40)
Total Protein: 7.5 g/dL (ref 6.0–8.5)

## 2022-11-06 LAB — LIPID PANEL
Chol/HDL Ratio: 3.1 ratio (ref 0.0–4.4)
Cholesterol, Total: 175 mg/dL (ref 100–199)
HDL: 57 mg/dL (ref >39)
LDL Chol Calc (NIH): 105 mg/dL — ABNORMAL HIGH (ref 0–99)
Triglycerides: 70 mg/dL (ref 0–149)
VLDL Cholesterol Cal: 13 mg/dL (ref 5–40)

## 2022-11-06 NOTE — Telephone Encounter (Addendum)
Call attempted, no answer, unable to leave message due to full mailbox.     ----- Message from Alver Sorrow, NP sent at 11/06/2022 10:04 AM EDT ----- Normal liver enzymes.  LDL of 105 which is not at goal of less than 70.  This is actually increased from her last check 3 months ago.  Please inquire which dose of Atorvastatin she is presently taking.  If taking 20mg , increase to 40mg .  If taking 40mg , increase to 80mg .   Repeat FLP/LFT in 3 months.

## 2022-11-07 ENCOUNTER — Ambulatory Visit: Payer: BC Managed Care – PPO | Admitting: Occupational Therapy

## 2022-11-08 NOTE — Telephone Encounter (Signed)
2nd call attempt, Left message for patient to call back  

## 2022-11-09 MED ORDER — ATORVASTATIN CALCIUM 80 MG PO TABS
80.0000 mg | ORAL_TABLET | Freq: Every day | ORAL | 3 refills | Status: DC
Start: 2022-11-09 — End: 2023-02-18

## 2022-11-09 NOTE — Telephone Encounter (Signed)
Called patient and reviewed the following results. She is agreeable to check increasing the medication up to 80mg   and having labs checked again in 3 months. Rx to pharm, labs mailed to patient.        ----- Message from Alver Sorrow, NP sent at 11/06/2022 10:04 AM EDT ----- Normal liver enzymes.  LDL of 105 which is not at goal of less than 70.  This is actually increased from her last check 3 months ago.   Please inquire which dose of Atorvastatin she is presently taking.  If taking 20mg , increase to 40mg .  If taking 40mg , increase to 80mg .    Repeat FLP/LFT in 3 months.

## 2022-11-09 NOTE — Addendum Note (Signed)
Addended by: Marlene Lard on: 11/09/2022 10:07 AM   Modules accepted: Orders

## 2022-11-14 ENCOUNTER — Encounter: Payer: BC Managed Care – PPO | Admitting: Occupational Therapy

## 2022-11-14 LAB — HM DIABETES EYE EXAM

## 2022-11-26 ENCOUNTER — Ambulatory Visit: Payer: BC Managed Care – PPO | Admitting: Cardiology

## 2022-11-27 ENCOUNTER — Encounter (HOSPITAL_BASED_OUTPATIENT_CLINIC_OR_DEPARTMENT_OTHER): Payer: Self-pay | Admitting: Cardiovascular Disease

## 2022-11-27 NOTE — Telephone Encounter (Signed)
Please advise if supplement okay to take for patient.

## 2022-12-21 ENCOUNTER — Telehealth: Payer: Self-pay | Admitting: Cardiovascular Disease

## 2022-12-21 NOTE — Telephone Encounter (Signed)
Patient is requesting a copy of her medication list for an appointment at 10:30 AM.

## 2022-12-21 NOTE — Telephone Encounter (Signed)
Returned call to patient, medication list printed and left at front desk. Patient will come pick up.

## 2023-01-01 ENCOUNTER — Telehealth (HOSPITAL_COMMUNITY): Payer: Self-pay | Admitting: Licensed Clinical Social Worker

## 2023-01-01 NOTE — Telephone Encounter (Signed)
Patient called to share concerns of billing issues with [employer] regarding her CPAP Machine. Patient requesting assistance with navigating this issue with the billing office. CSW contacted the billing office and discussed at length and informed that patient will owe $15.08 monthly until November and at that time she will own the CPAP machine. Patient grateful for the explanation and appears to understand going forward. CSW available as needed. Lasandra Beech, LCSW, CCSW-MCS [phone]

## 2023-01-12 ENCOUNTER — Other Ambulatory Visit: Payer: Self-pay | Admitting: Family Medicine

## 2023-01-12 DIAGNOSIS — E890 Postprocedural hypothyroidism: Secondary | ICD-10-CM

## 2023-01-12 DIAGNOSIS — I10 Essential (primary) hypertension: Secondary | ICD-10-CM

## 2023-01-14 ENCOUNTER — Other Ambulatory Visit: Payer: Self-pay | Admitting: Gastroenterology

## 2023-01-24 ENCOUNTER — Ambulatory Visit (INDEPENDENT_AMBULATORY_CARE_PROVIDER_SITE_OTHER): Payer: BC Managed Care – PPO

## 2023-01-24 ENCOUNTER — Telehealth: Payer: BC Managed Care – PPO | Admitting: Family Medicine

## 2023-01-24 ENCOUNTER — Encounter: Payer: Self-pay | Admitting: Family Medicine

## 2023-01-24 VITALS — BP 129/75 | Temp 97.8°F

## 2023-01-24 DIAGNOSIS — Z20822 Contact with and (suspected) exposure to covid-19: Secondary | ICD-10-CM | POA: Diagnosis not present

## 2023-01-24 DIAGNOSIS — R110 Nausea: Secondary | ICD-10-CM

## 2023-01-24 LAB — POC COVID19 BINAXNOW: SARS Coronavirus 2 Ag: NEGATIVE

## 2023-01-24 MED ORDER — NIRMATRELVIR/RITONAVIR (PAXLOVID)TABLET
3.0000 | ORAL_TABLET | Freq: Two times a day (BID) | ORAL | 0 refills | Status: AC
Start: 2023-01-24 — End: 2023-01-29

## 2023-01-24 MED ORDER — ONDANSETRON HCL 4 MG PO TABS: 4.0000 mg | ORAL_TABLET | Freq: Three times a day (TID) | ORAL | 0 refills | Status: AC | PRN

## 2023-01-24 NOTE — Progress Notes (Signed)
Virtual Visit via Video Note  I connected with Marilyn [name] on 01/24/23 at  4:15 PM EDT by a video enabled telemedicine application and verified that I am speaking with the correct person using two identifiers.  Location patient: home Location provider:work or home office Persons participating in the virtual visit: patient, provider  I discussed the limitations of evaluation and management by telemedicine and the availability of in person appointments. The patient expressed understanding and agreed to proceed.  Chief Complaint  Patient presents with   Cough    Yesterday patient started feeling sick sinus drainage, stomach pain, has been in contact with someone that had covid    HPI: Patient is a 57 year old female seen today for acute concern.  Pt was in contact with her co-worker yesterday who tested positive for COVID.  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  Pt started feeling tired yesterday evening 8/21, stomach was hurting, had post-nasal drip, stuffiness, dull intermittent HA, dull pains in neck, ST, and nausea.  No fever, cough. Home COVID test was negative.  COVID test in clinic also negative.  Took vitamin C, Zinc, tea, cough drops, Coricidin HBP.  Cut lipitor down from 80 to 40 mg 2/2 causing chest discomfort.  ROS: See pertinent positives and negatives per HPI.  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   Neck pain 09/09/2011   Obesity 04/11/2014   Pericardial  effusion 02/07/2022   Sleep apnea     Past Surgical History:  Procedure Laterality Date   BREAST BIOPSY Right 12/2014   BREAST EXCISIONAL BIOPSY Right 2016   BREAST LUMPECTOMY WITH RADIOACTIVE SEED LOCALIZATION Right 02/25/2015   Procedure: BREAST LUMPECTOMY WITH RADIOACTIVE SEED LOCALIZATION;  Surgeon: Ovidio Kin, MD;  Location: [facility];  Service: General;  Laterality: Right;   BREAST REDUCTION SURGERY  1986   BREAST SURGERY     broken jaw     CARPAL TUNNEL RELEASE  2001   COLONOSCOPY     FRACTURE SURGERY     REDUCTION MAMMAPLASTY Bilateral    THYROIDECTOMY     TUBAL LIGATION  1987   UPPER GASTROINTESTINAL ENDOSCOPY      Family History  Problem Relation Age of Onset   Asthma Mother    Hypertension Father    Diabetes Father    Hyperlipidemia Father    Breast cancer Sister 55   Thyroid disease Sister    Migraines Daughter    Breast cancer Maternal Aunt    Diabetes Maternal Grandmother    Heart disease Maternal Grandmother    Heart attack Maternal Grandmother 60   Stroke Maternal Grandmother    Asthma Maternal Grandfather    Heart attack Maternal Grandfather    Stroke Maternal Grandfather    Colon cancer Brother    Esophageal cancer Neg Hx    Stomach cancer Neg Hx    Rectal cancer Neg Hx  Current Outpatient Medications:    acetaminophen (TYLENOL) 500 MG tablet, Take 1 tablet (500 mg total) by mouth every 6 (six) hours as needed., Disp: 20 tablet, Rfl: 0   albuterol (VENTOLIN HFA) 108 (90 Base) MCG/ACT inhaler, INHALE 2 PUFFS BY MOUTH EVERY 4 HOURS AS NEEDED FOR WHEEZING,COUGHING OR FOR SHORTNESS OF BREATH, Disp: 8.5 g, Rfl: 0   amLODipine (NORVASC) 10 MG tablet, TAKE 1 TABLET BY MOUTH DAILY, Disp: 30 tablet, Rfl: 0   aspirin EC 81 MG tablet, Take 1 tablet (81 mg total) by mouth daily. Swallow whole., Disp: 90 tablet, Rfl: 3   atorvastatin (LIPITOR) 80 MG tablet, Take 1 tablet (80 mg total) by mouth daily., Disp: 90 tablet, Rfl: 3    Beclomethasone Dipropionate 40 MCG/ACT AERS, Place 1 spray into the nose in the morning and at bedtime., Disp: 6.8 g, Rfl: 1   Blood Glucose Monitoring Suppl (ONETOUCH VERIO FLEX SYSTEM) w/Device KIT, Use to check blood glucose TID or prn, Disp: 1 kit, Rfl: 0   Blood Pressure Monitoring (COMFORT TOUCH BP CUFF/LARGE) MISC, Please measure blood pressure 1 time per day 1-2 hours after taking medication and resting for 5-10 minutes, please keep a log and bring to all future appointments. DX I10, Disp: 1 each, Rfl: 0   cetirizine (ZYRTEC) 10 MG tablet, Take 1 tablet (10 mg total) by mouth daily., Disp: 5 tablet, Rfl: 0   Cholecalciferol (VITAMIN D) 125 MCG (5000 UT) CAPS, Take 1 tablet by mouth daily., Disp: , Rfl:    cyclobenzaprine (FLEXERIL) 5 MG tablet, Take 1 tablet (5 mg total) by mouth 3 (three) times daily as needed., Disp: 90 tablet, Rfl: 1   EPIPEN 2-PAK 0.3 MG/0.3ML SOAJ injection, Inject 0.3 mg into the muscle as needed for anaphylaxis., Disp: 1 each, Rfl: 0   gabapentin (NEURONTIN) 300 MG capsule, Take 1 capsule (300 mg total) by mouth at bedtime., Disp: 30 capsule, Rfl: 8   glucose blood (ONETOUCH VERIO) test strip, TEST AS DIRECTED THREE TIMES A DAY, Disp: 100 strip, Rfl: 2   ibuprofen (ADVIL) 600 MG tablet, Take 1 tablet (600 mg total) by mouth every 6 (six) hours as needed., Disp: 12 tablet, Rfl: 0   Lancets (ONETOUCH DELICA PLUS LANCET30G) MISC, Check  3 times daily., Disp: 100 each, Rfl: 2   levothyroxine (SYNTHROID) 100 MCG tablet, TAKE 1 TABLET BY MOUTH DAILY, Disp: 30 tablet, Rfl: 0   metoprolol succinate (TOPROL-XL) 25 MG 24 hr tablet, TAKE 1 TABLET BY MOUTH DAILY, Disp: 90 tablet, Rfl: 2   omeprazole (PRILOSEC) 40 MG capsule, TAKE ONE CAPSULE BY MOUTH TWO TIMES A DAY, Disp: 180 capsule, Rfl: 1   ondansetron (ZOFRAN-ODT) 4 MG disintegrating tablet, Take 1 tablet (4 mg total) by mouth every 8 (eight) hours as needed for nausea or vomiting., Disp: 8 tablet, Rfl: 0   vitamin B-12  (CYANOCOBALAMIN) 500 MCG tablet, Take 500 mcg by mouth daily., Disp: , Rfl:   EXAM:  VITALS per patient if applicable:  RR between 12-20 bpm  GENERAL: alert, oriented, appears well and in no acute distress  HEENT: atraumatic, conjunctiva clear, no obvious abnormalities on inspection of external nose and ears  NECK: normal movements of the head and neck  LUNGS: on inspection no signs of respiratory distress, breathing rate appears normal, no obvious gross SOB, gasping or wheezing  CV: no obvious cyanosis  MS: moves all visible extremities without noticeable abnormality  PSYCH/NEURO: pleasant and cooperative, no obvious depression or anxiety, speech and thought  processing grossly intact  ASSESSMENT AND PLAN:  Discussed the following assessment and plan:  Close exposure to COVID-19 virus - Plan: nirmatrelvir/ritonavir (PAXLOVID) 20 x 150 MG & 10 x 100MG  TABS  Nausea - Plan: ondansetron (ZOFRAN) 4 MG tablet  Acute viral URI symptoms starting 01/23/2023 after close contact with COVID-positive coworker.  COVID-19 testing negative.  Discussed r/b/a of antiviral medications.  Patient wishes to start Paxlovid.  Continue supportive care.  Given note for work.  Given strict precautions.    Follow-up.   I discussed the assessment and treatment plan with the patient. The patient was provided an opportunity to ask questions and all were answered. The patient agreed with the plan and demonstrated an understanding of the instructions.   The patient was advised to call back or seek an in-person evaluation if the symptoms worsen or if the condition fails to improve as anticipated.   Deeann Saint, MD

## 2023-02-07 ENCOUNTER — Ambulatory Visit: Payer: BC Managed Care – PPO | Admitting: Internal Medicine

## 2023-02-07 ENCOUNTER — Encounter: Payer: Self-pay | Admitting: Internal Medicine

## 2023-02-07 VITALS — BP 120/70 | HR 90 | Ht 63.0 in | Wt 201.8 lb

## 2023-02-07 DIAGNOSIS — R1310 Dysphagia, unspecified: Secondary | ICD-10-CM | POA: Diagnosis not present

## 2023-02-07 DIAGNOSIS — E890 Postprocedural hypothyroidism: Secondary | ICD-10-CM | POA: Diagnosis not present

## 2023-02-07 NOTE — Patient Instructions (Addendum)
Please continue Levothyroxine 100 mcg daily.  Take the thyroid hormone every day, with water, at least 30 minutes before breakfast, separated by at least 4 hours from: - acid reflux medications - calcium - iron - multivitamins  Please return in 1 year.

## 2023-02-07 NOTE — Progress Notes (Signed)
Patient ID: Marilyn [name], female   DOB: [birth year], 57 y.o.   MRN: [id number]  HPI  Marilyn [name] is a 57 y.o.-year-old female, initially referred by her PCP, Dr. Salomon Fick, returning for follow-up for hypothyroidism. Her mother, Marilyn [name], and sister, Marilyn [name], are also my pts. Last visit 6 months ago.  Interim history: She  has R knee pain. She also complains of weight gain/inability lose weight, constipation, leg swelling. She continues to have problems swallowing with foods and pills getting stuck in her throat.  Reviewed history: Pt. 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 She saw Dr. Dagoberto Ligas in the past until he retired.  She was initially on Synthroid DAW, then switched to generic.  Pt. is on levothyroxine 100 mcg daily, taken: - in am (4-4:30) - fasting - at least 2h from b'fast - no calcium - no iron - + multivitamins - 30 min to 1h later >> moved 4h later - + PPIs - prn, in am >> moved 4h later - not on Biotin  I reviewed pt's thyroid tests: Lab Results  Component Value Date   TSH 0.58 08/03/2022   TSH 0.80 01/12/2022   TSH 0.352 (L) 11/17/2021   TSH 0.64 07/11/2021   TSH 0.52 03/04/2020   TSH 1.06 07/17/2019   TSH 1.288 05/14/2019   TSH 1.63 05/12/2018   TSH 1.30 06/18/2017   TSH 0.174 (L) 02/20/2017   FREET4 1.01 08/03/2022   FREET4 0.99 01/12/2022   FREET4 0.83 07/17/2019   FREET4 0.94 06/18/2017   T3FREE 2.1 07/18/2015   Antithyroid antibodies: No results found for: "THGAB" No components found for: "TPOAB"  At last visit she mentioned: - fatigue - weight gain and increased appetite - both heat and cold intolerance - constipation - hair loss - joint and muscle pains This symptoms persist.  She continues to have: -Dysphagia with pills -Pressure in the back of her neck so that she cannot swallow well and sometimes has to spit -She feels a blockage in her throat -She cannot spit  phlegm -Also, problems breathing-had a panic attack when she had a sleep study -Feeling strangled especially when more anxious but also with drinking water or eating something hot -she is scared to eat in public but also at home. -voice giving out -She also has pressure in R ear and HAs. Sees neurology.  She saw ENT in the past and had the following studies: CT neck (10/28/2018): Pharynx and larynx: No mucosal or submucosal lesion is seen. Salivary glands: Parotid and submandibular glands appear symmetric and normal. No evidence of inflammation or stone disease. Thyroid: Diminutive thyroid tissue.  No mass. Lymph nodes: No enlarged or low-density nodes on either side of the neck. Vascular: No abnormal vascular finding. Jugular veins widely patent. Limited intracranial: Normal Visualized orbits: Normal Mastoids and visualized paranasal sinuses: Clear Skeleton: Old mandibular fracture on the right, healed. No significant bone finding. No evidence of significant dental or periodontal disease.  Barium swallow (11/21/2018): There is premature spillage of the barium bolus into the vallecula and hypopharynx with every swallow. Otherwise normal oral and pharyngeal phase of swallowing, with no laryngeal penetration or tracheobronchial aspiration. No significant barium retention in the pharynx. No evidence of pharyngeal mass, stricture or diverticulum. No significant cricopharyngeus muscle dysfunction.   Marked esophageal dysmotility, characterized by intermittent prominent weakening of primary peristalsis throughout the thoracic esophagus. Small hiatal hernia, which appears fixed. Severe gastroesophageal reflux elicited to the level of the  thoracic inlet with water siphon test and Valsalva maneuver. There is granularity of the esophageal mucosa throughout the thoracic esophagus compatible with mild reflux esophagitis. No evidence of esophageal mass, stricture or ulcer. Patient was unable to  swallow the barium tablet, which limits evaluation.   IMPRESSION: 1. Small hiatal hernia.  Severe gastroesophageal reflux elicited. 2. 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 3. Mild reflux esophagitis. No evidence of esophageal mass, stricture or ulcer, see comments. 4. Premature spillage of barium bolus into the vallecula and hypopharynx with every swallow. Otherwise normal oral and pharyngeal phases of swallowing.   She has + FH of thyroid disorders in: sister and mother (also my pts). No FH of thyroid cancer. No h/o radiation tx to head or neck. No recent use of iodine supplements.  No herbal supplements.  No recent steroid use.  Pt. also has a history of CAD, h/o pericardial effusion, HTN, HL, OSA, GERD, vitamin D deficiency, TMJ.  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   Neck pain 09/09/2011   Obesity 04/11/2014   Pericardial effusion 02/07/2022   Sleep apnea    Past Surgical History:  Procedure Laterality Date   BREAST BIOPSY Right 12/2014   BREAST EXCISIONAL BIOPSY Right 2016   BREAST LUMPECTOMY WITH RADIOACTIVE SEED LOCALIZATION Right 02/25/2015   Procedure: BREAST LUMPECTOMY WITH RADIOACTIVE SEED LOCALIZATION;  Surgeon: Ovidio Kin, MD;  Location: [facility];  Service: General;  Laterality: Right;   BREAST REDUCTION SURGERY  1986   BREAST SURGERY     broken jaw     CARPAL TUNNEL RELEASE  2001   COLONOSCOPY     FRACTURE SURGERY     REDUCTION MAMMAPLASTY Bilateral    THYROIDECTOMY     TUBAL LIGATION  1987   UPPER GASTROINTESTINAL ENDOSCOPY     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[employer]    Employer: [employer]  Tobacco Use   Smoking status: Former    Current packs/day: 0.00    Average packs/day: 0.3 packs/day for 3.0 years (0.8 ttl pk-yrs)    Types: Cigarettes    Start date: 06/04/1977    Quit date: 06/04/1980    Years since quitting: 42.7   Smokeless tobacco: Never  Vaping Use   Vaping status: Never Used  Substance and Sexual Activity   Alcohol use: No    Alcohol/week: 0.0 standard drinks of alcohol   Drug use: No   Sexual activity: Not Currently    Comment: 1st intercourse 12 yo-5 partners  Other Topics Concern  Not on file  Social History Narrative   Married   Social Determinants of Health   Financial Resource Strain: Low Risk  (07/26/2021)   Overall Financial Resource Strain (CARDIA)    Difficulty of Paying Living Expenses: Not hard at all  Food Insecurity: No Food Insecurity (07/26/2021)   Hunger Vital Sign    Worried About Running Out of Food in the Last Year: Never true    Ran Out of Food in the Last Year: Never true  Transportation Needs: No Transportation Needs (07/26/2021)   PRAPARE - Administrator, Civil Service (Medical): No    Lack of Transportation (Non-Medical): No  Physical Activity: Insufficiently Active (07/26/2021)   Exercise Vital Sign    Days of Exercise per Week: 2 days    Minutes of Exercise per Session: 40 min  Stress: Not on file  Social Connections: Unknown  (10/16/2021)   Received from [facility]   Social Network    Social Network: Not on file  Intimate Partner Violence: Unknown (09/07/2021)   Received from [facility]   HITS    Physically Hurt: Not on file    Insult or Talk Down To: Not on file    Threaten Physical Harm: Not on file    Scream or Curse: Not on file   Current Outpatient Medications on File Prior to Visit  Medication Sig Dispense Refill   acetaminophen (TYLENOL) 500 MG tablet Take 1 tablet (500 mg total) by mouth every 6 (six) hours as needed. 20 tablet 0   albuterol (VENTOLIN HFA) 108 (90 Base) MCG/ACT inhaler INHALE 2 PUFFS BY MOUTH EVERY 4 HOURS AS NEEDED FOR WHEEZING,COUGHING OR FOR SHORTNESS OF BREATH 8.5 g 0   amLODipine (NORVASC) 10 MG tablet TAKE 1 TABLET BY MOUTH DAILY 30 tablet 0   aspirin EC 81 MG tablet Take 1 tablet (81 mg total) by mouth daily. Swallow whole. 90 tablet 3   atorvastatin (LIPITOR) 80 MG tablet Take 1 tablet (80 mg total) by mouth daily. 90 tablet 3   Beclomethasone Dipropionate 40 MCG/ACT AERS Place 1 spray into the nose in the morning and at bedtime. 6.8 g 1   Blood Glucose Monitoring Suppl (ONETOUCH VERIO FLEX SYSTEM) w/Device KIT Use to check blood glucose TID or prn 1 kit 0   Blood Pressure Monitoring (COMFORT TOUCH BP CUFF/LARGE) MISC Please measure blood pressure 1 time per day 1-2 hours after taking medication and resting for 5-10 minutes, please keep a log and bring to all future appointments. DX I10 1 each 0   cetirizine (ZYRTEC) 10 MG tablet Take 1 tablet (10 mg total) by mouth daily. 5 tablet 0   Cholecalciferol (VITAMIN D) 125 MCG (5000 UT) CAPS Take 1 tablet by mouth daily.     cyclobenzaprine (FLEXERIL) 5 MG tablet Take 1 tablet (5 mg total) by mouth 3 (three) times daily as needed. 90 tablet 1   EPIPEN 2-PAK 0.3 MG/0.3ML SOAJ injection Inject 0.3 mg into the muscle as needed for anaphylaxis. 1 each 0   gabapentin (NEURONTIN) 300 MG capsule Take 1 capsule (300 mg total) by mouth at  bedtime. 30 capsule 8   glucose blood (ONETOUCH VERIO) test strip TEST AS DIRECTED THREE TIMES A DAY 100 strip 2   ibuprofen (ADVIL) 600 MG tablet Take 1 tablet (600 mg total) by mouth every 6 (six) hours as needed. 12 tablet 0   Lancets (ONETOUCH DELICA PLUS LANCET30G) MISC Check  3 times daily. 100 each 2  levothyroxine (SYNTHROID) 100 MCG tablet TAKE 1 TABLET BY MOUTH DAILY 30 tablet 0   metoprolol succinate (TOPROL-XL) 25 MG 24 hr tablet TAKE 1 TABLET BY MOUTH DAILY 90 tablet 2   omeprazole (PRILOSEC) 40 MG capsule TAKE ONE CAPSULE BY MOUTH TWO TIMES A DAY 180 capsule 1   ondansetron (ZOFRAN) 4 MG tablet Take 1 tablet (4 mg total) by mouth every 8 (eight) hours as needed for nausea or vomiting. 10 tablet 0   ondansetron (ZOFRAN-ODT) 4 MG disintegrating tablet Take 1 tablet (4 mg total) by mouth every 8 (eight) hours as needed for nausea or vomiting. 8 tablet 0   vitamin B-12 (CYANOCOBALAMIN) 500 MCG tablet Take 500 mcg by mouth daily.     No current facility-administered medications on file prior to visit.   Allergies  Allergen Reactions   Promethazine-Dm    [place] Gum [Fish Allergy] Anaphylaxis   Codeine Nausea And Vomiting   Fluticasone Propionate Other (See Comments)   Promethazine Other (See Comments)   Flonase [Fluticasone Propionate] Other (See Comments)    Nose bleeds   Family History  Problem Relation Age of Onset   Asthma Mother    Hypertension Father    Diabetes Father    Hyperlipidemia Father    Breast cancer Sister 82   Thyroid disease Sister    Migraines Daughter    Breast cancer Maternal Aunt    Diabetes Maternal Grandmother    Heart disease Maternal Grandmother    Heart attack Maternal Grandmother 60   Stroke Maternal Grandmother    Asthma Maternal Grandfather    Heart attack Maternal Grandfather    Stroke Maternal Grandfather    Colon cancer Brother    Esophageal cancer Neg Hx    Stomach cancer Neg Hx    Rectal cancer Neg Hx    PE: BP 120/70   Pulse 90    Ht 5\' 3"  (1.6 m)   Wt 201 lb 12.8 oz (91.5 kg)   LMP 02/21/2013   SpO2 97%   BMI 35.75 kg/m  Wt Readings from Last 3 Encounters:  02/07/23 201 lb 12.8 oz (91.5 kg)  11/05/22 196 lb 9.6 oz (89.2 kg)  08/23/22 198 lb (89.8 kg)   Constitutional: overweight, in NAD Eyes:  EOMI, no exophthalmos ENT: no neck masses, no cervical lymphadenopathy Cardiovascular: RRR, No MRG Respiratory: CTA B Musculoskeletal: no deformities Skin:no rashes Neurological: no tremor with outstretched hands  ASSESSMENT: 1. Hypothyroidism  2.  Dysphagia  PLAN:  1. Patient with long standing hypothyroidism, on levothyroxine therapy - latest thyroid labs reviewed with pt. >> normal: Lab Results  Component Value Date   TSH 0.58 08/03/2022  - she continues on LT4 100 mcg daily - pt feels good on this dose.  She is frustrated about inability to lose weight.  She lost 4 pounds since last visit.  We discussed about trying to do a clear liquid only fast 1 day a week.  She would be interested to try this. - we discussed about taking the thyroid hormone every day, with water, >30 minutes before breakfast, separated by >4 hours from acid reflux medications, calcium, iron, multivitamins. Pt. is taking it correctly.  At last visit, she was taking it too close to multivitamins and PPIs and we moved these later - will check thyroid tests today: TSH and fT4 - If labs are abnormal, she will need to return for repeat TFTs in 1.5 months  2.  Dysphagia -Patient continues to have bothersome neck symptoms including dysphagia,  getting strangled, and voice changes and at last visit she was worried that this may be related to the thyroid.  Her thyroid surgery was done when she was a child and the symptoms started much later.  Reviewing the report of the CT scan of the neck from 10/2018, this mentions that the thyroid tissue was diminutive.  There were no masses.  We again discussed that she appears to have had total thyroidectomy.   Based on this report, the thyroid tissue left behind did not appear to be the culprit for her neck symptoms.  At this visit, we again reviewed her barium swallow report from 11/2018, it appears that she had severe esophageal dysmotility apparently caused by reflux.  Therefore, symptoms appears to be functional, rather than structural.  She saw GI (Dr. Russella Dar) and continues on PPIs.  He mentioned that her dysphagia appears to be stable. -I am not sure whether there are any treatments for esophageal dysmotility, but I did advise her to discuss with PCP and Dr. Russella Dar. -No endocrine intervention needed for now  Needs refills.  Component     Latest Ref Rng 02/07/2023  TSH     0.35 - 5.50 uIU/mL 0.60   T4,Free(Direct)     0.60 - 1.60 ng/dL 1.61   Thyroid tests are normal.  Will refill her levothyroxine.  Carlus Pavlov, MD PhD [facility]

## 2023-02-08 ENCOUNTER — Encounter: Payer: Self-pay | Admitting: Internal Medicine

## 2023-02-08 LAB — TSH: TSH: 0.6 u[IU]/mL (ref 0.35–5.50)

## 2023-02-08 LAB — T4, FREE: Free T4: 1.01 ng/dL (ref 0.60–1.60)

## 2023-02-08 MED ORDER — LEVOTHYROXINE SODIUM 100 MCG PO TABS
100.0000 ug | ORAL_TABLET | Freq: Every day | ORAL | 3 refills | Status: DC
Start: 2023-02-08 — End: 2023-02-14

## 2023-02-12 ENCOUNTER — Other Ambulatory Visit: Payer: Self-pay | Admitting: Family Medicine

## 2023-02-12 DIAGNOSIS — E890 Postprocedural hypothyroidism: Secondary | ICD-10-CM

## 2023-02-12 DIAGNOSIS — I10 Essential (primary) hypertension: Secondary | ICD-10-CM

## 2023-02-14 ENCOUNTER — Other Ambulatory Visit: Payer: Self-pay | Admitting: Family Medicine

## 2023-02-14 DIAGNOSIS — E890 Postprocedural hypothyroidism: Secondary | ICD-10-CM

## 2023-02-14 DIAGNOSIS — I10 Essential (primary) hypertension: Secondary | ICD-10-CM

## 2023-02-18 ENCOUNTER — Telehealth (HOSPITAL_BASED_OUTPATIENT_CLINIC_OR_DEPARTMENT_OTHER): Payer: Self-pay | Admitting: Cardiovascular Disease

## 2023-02-18 NOTE — Telephone Encounter (Signed)
  Per MyChart scheduling message:  Initial Complaint:  Chest pain and taking the 80 mg of adtrovastin   Pt c/o of Chest Pain: STAT if active CP, including tightness, pressure, jaw pain, radiating pain to shoulder/upper arm/back, CP unrelieved by Nitro. Symptoms reported of SOB, nausea, vomiting, sweating.  1. Are you having CP right now?   2. Are you experiencing any other symptoms (ex. SOB, nausea, vomiting, sweating)?   3. Is your CP continuous or coming and going?   4. Have you taken Nitroglycerin?   5. How long have you been experiencing CP?   6. If NO CP at time of call then end call with telling Pt to call back or call 911 if Chest pain returns prior to return call from triage team.    I'm no longer having the cp I stop talking the 80 and took a break from them .Marland KitchenStarted taking the 20 every other day . As of this week.

## 2023-02-18 NOTE — Telephone Encounter (Signed)
Advised patient, verbalized understanding  Patient preferred seeing Pharm D, scheduled appointment next week

## 2023-02-18 NOTE — Telephone Encounter (Signed)
Spoke with patient regarding chest pain Per patient she was having hurting/soreness in her chest with movement Described as an ache which was worse bending over  Stopped for a week and pain improved. Went back on the 40 mg and had soreness in chest so stopped completely. Pain has subsided Wanted to start back on the Atorvastatin at 20 mg  Patient was asking for something natural, explained that was unlikely given she was not at goal on previous dose  Advised patient would forward to Ronn Melena NP for review

## 2023-02-18 NOTE — Telephone Encounter (Signed)
Given aortic atherosclerosis LDL goal <70. Unfortunately natural methods do have not been found to be effective in lowering cholesterol and preventing progression of aortic atherosclerosis. (Article reference if she is interested can send via MyChart: [email or web address]). Low suspicion the discomfort is related to Atorvastatin and myalgias from statin usually are in more than one area.   If she really wishes to take Atorvastatin 20mg  will need to take with Zetia 10mg  daily and repeat FLP/FLT in 8 weeks.   If she wishes to meet with pharmacy lipid clinic to discuss that is also fine.   Alver Sorrow, NP

## 2023-02-21 ENCOUNTER — Ambulatory Visit: Payer: BC Managed Care – PPO | Admitting: Psychiatry

## 2023-02-25 ENCOUNTER — Other Ambulatory Visit: Payer: Self-pay | Admitting: *Deleted

## 2023-02-25 DIAGNOSIS — I493 Ventricular premature depolarization: Secondary | ICD-10-CM

## 2023-02-25 DIAGNOSIS — R002 Palpitations: Secondary | ICD-10-CM

## 2023-02-25 MED ORDER — METOPROLOL SUCCINATE ER 25 MG PO TB24: 25.0000 mg | ORAL_TABLET | Freq: Every day | ORAL | 3 refills | Status: DC

## 2023-03-01 ENCOUNTER — Other Ambulatory Visit (HOSPITAL_BASED_OUTPATIENT_CLINIC_OR_DEPARTMENT_OTHER): Payer: Self-pay | Admitting: *Deleted

## 2023-03-01 ENCOUNTER — Ambulatory Visit
Payer: BC Managed Care – PPO | Attending: Internal Medicine | Admitting: Pharmacist Clinician (PhC)/ Clinical Pharmacy Specialist

## 2023-03-01 ENCOUNTER — Encounter: Payer: Self-pay | Admitting: Pharmacist Clinician (PhC)/ Clinical Pharmacy Specialist

## 2023-03-01 DIAGNOSIS — E785 Hyperlipidemia, unspecified: Secondary | ICD-10-CM

## 2023-03-01 DIAGNOSIS — I10 Essential (primary) hypertension: Secondary | ICD-10-CM

## 2023-03-01 NOTE — Progress Notes (Signed)
Office Visit    Patient Name: Marilyn [name] Date of Encounter: 03/01/2023  Primary Care Provider:  Deeann Saint, MD Primary Cardiologist:  Chilton Si, MD  Chief Complaint    Hyperlipidemia   Significant Past Medical History   HTN Controlled at last visit, on metoprolol succ, amlodipine  CAD Aortic atherosclerosis (I 70)  OSA On CPAP  obesity BMI 34.83  Hypothyroid 9/24 TSH 0.6 - on levothyroxine 100 mcg     Allergies  Allergen Reactions   Promethazine-Dm    [place] Gum [Fish Allergy] Anaphylaxis   Codeine Nausea And Vomiting   Fluticasone Propionate Other (See Comments)   Promethazine Other (See Comments)   Atorvastatin     Muscle aches    Flonase [Fluticasone Propionate] Other (See Comments)    Nose bleeds    History of Present Illness    Marilyn [name] is a 57 y.o. female patient of Dr Duke Salvia, in the office today to discuss options for cholesterol management.   She was previously on atorvastatin 20 mg, but when the dose was increased to 40 then 80 mg, developed myalgias.  She stopped completely for a few weeks then rechallenged with the 80 mg dose and the pains came back.  She has since stopped it completely.  In reviewing her chart, her LDL was as high as 192 back in 2015.   Today she is concerned, as her grandmother died from an MI while in her 63's and patient has been found on scan to have aortic atherosclerosis.  She has also been fairly stressed recently because multiple family members have been diagnosed with cancer.    Insurance Carrier:   [employer] plan  LDL Cholesterol goal:  LDL < 70  Current Medications:   none    Previously tried:  atorvastatin 20and 40 were okay, when increased to 80 mg developed chest pain (across entire chest wall); cut back to 40 and still had intermittent pains.  Felt fine on the 20 mg dose   Family Hx: maternal GM had MI/CVA died in her early 44's, mother died asthma in her early 20's;  father DM,  HTN; no ASCVD in siblings  Social Hx: Tobacco:  no Alcohol: no   Diet:  has eaten a lot of fast foods over past few months, getting ribs on Fridays; trying to get back to healthier eating  Exercise: previously walked up to 6 miles per day, hasn't done much for past few months   Accessory Clinical Findings   Lab Results  Component Value Date   CHOL 216 (H) 02/19/2023   HDL 39 (L) 02/19/2023   LDLCALC 156 (H) 02/19/2023   LDLDIRECT 165.3 12/15/2012   TRIG 117 02/19/2023   CHOLHDL 5.5 (H) 02/19/2023    No results found for: "LIPOA"  Lab Results  Component Value Date   ALT 25 02/19/2023   AST 22 02/19/2023   ALKPHOS 89 02/19/2023   BILITOT 0.3 02/19/2023   Lab Results  Component Value Date   CREATININE 0.89 08/03/2022   BUN 10 08/03/2022   NA 141 08/03/2022   K 4.4 08/03/2022   CL 104 08/03/2022   CO2 25 08/03/2022   Lab Results  Component Value Date   HGBA1C 6.4 07/11/2021    Home Medications    Current Outpatient Medications  Medication Sig Dispense Refill   acetaminophen (TYLENOL) 500 MG tablet Take 1 tablet (500 mg total) by mouth every 6 (six) hours as needed. 20 tablet 0   albuterol (  VENTOLIN HFA) 108 (90 Base) MCG/ACT inhaler INHALE 2 PUFFS BY MOUTH EVERY 4 HOURS AS NEEDED FOR WHEEZING,COUGHING OR FOR SHORTNESS OF BREATH 8.5 g 0   amLODipine (NORVASC) 10 MG tablet TAKE 1 TABLET BY MOUTH DAILY 90 tablet 0   aspirin EC 81 MG tablet Take 1 tablet (81 mg total) by mouth daily. Swallow whole. 90 tablet 3   Beclomethasone Dipropionate 40 MCG/ACT AERS Place 1 spray into the nose in the morning and at bedtime. 6.8 g 1   Blood Glucose Monitoring Suppl (ONETOUCH VERIO FLEX SYSTEM) w/Device KIT Use to check blood glucose TID or prn 1 kit 0   Blood Pressure Monitoring (COMFORT TOUCH BP CUFF/LARGE) MISC Please measure blood pressure 1 time per day 1-2 hours after taking medication and resting for 5-10 minutes, please keep a log and bring to all future appointments. DX I10  1 each 0   cetirizine (ZYRTEC) 10 MG tablet Take 1 tablet (10 mg total) by mouth daily. 5 tablet 0   Cholecalciferol (VITAMIN D) 125 MCG (5000 UT) CAPS Take 1 tablet by mouth daily.     cyclobenzaprine (FLEXERIL) 5 MG tablet Take 1 tablet (5 mg total) by mouth 3 (three) times daily as needed. 90 tablet 1   EPIPEN 2-PAK 0.3 MG/0.3ML SOAJ injection Inject 0.3 mg into the muscle as needed for anaphylaxis. 1 each 0   glucose blood (ONETOUCH VERIO) test strip TEST AS DIRECTED THREE TIMES A DAY 100 strip 2   ibuprofen (ADVIL) 600 MG tablet Take 1 tablet (600 mg total) by mouth every 6 (six) hours as needed. 12 tablet 0   Lancets (ONETOUCH DELICA PLUS LANCET30G) MISC Check  3 times daily. 100 each 2   levothyroxine (SYNTHROID) 100 MCG tablet TAKE 1 TABLET BY MOUTH DAILY 90 tablet 0   metoprolol succinate (TOPROL-XL) 25 MG 24 hr tablet Take 1 tablet (25 mg total) by mouth daily. 90 tablet 3   omeprazole (PRILOSEC) 40 MG capsule TAKE ONE CAPSULE BY MOUTH TWO TIMES A DAY 180 capsule 1   ondansetron (ZOFRAN) 4 MG tablet Take 1 tablet (4 mg total) by mouth every 8 (eight) hours as needed for nausea or vomiting. 10 tablet 0   ondansetron (ZOFRAN-ODT) 4 MG disintegrating tablet Take 1 tablet (4 mg total) by mouth every 8 (eight) hours as needed for nausea or vomiting. 8 tablet 0   vitamin B-12 (CYANOCOBALAMIN) 500 MCG tablet Take 500 mcg by mouth daily.     No current facility-administered medications for this visit.     Assessment & Plan    Hyperlipidemia Assessment: Patient with ASCVD/familial hyperlipidemia not at LDL goal of < 70 Most recent LDL 156 on 02/19/23  Not able to tolerate higher doses of statin secondary to myalgias Reviewed options for lowering LDL cholesterol, including ezetimibe, PCSK-9 inhibitors, bempedoic acid and inclisiran.  Discussed mechanisms of action, dosing, side effects, potential decreases in LDL cholesterol and costs.  Also reviewed potential options for patient  assistance.  Plan: Patient agreeable to getting Coronary calcium scoring to further determine coronary risk Restart atorvastatin 20 mg once daily Will talk to patient after calcium score results to determine what best course of treatment will be.     Phillips Hay, PharmD CPP [facility] [address]  [place], [place] [address] [phone]  03/01/2023, 4:05 PM

## 2023-03-01 NOTE — Patient Instructions (Addendum)
Your Results:             Your most recent labs Goal  Total Cholesterol 216 < 200  Triglycerides 117 < 150  HDL (happy/good cholesterol) 39 > 40  LDL (lousy/bad cholesterol 156 < 70   Medication changes:  Restart the atorvastatin at 20 mg daily.  You can cut the 40 mg tablets in half.     I will have Dr. Duke Salvia order a coronary calcium test.   When we get the results of this we can discuss the  need for further medication (Leqvio or Repatha)  If you have any further questions or concerns, please reach out to me via MyChart or [phone]   Thank you for choosing [facility]  Phillips Hay PharmD

## 2023-03-01 NOTE — Assessment & Plan Note (Signed)
Assessment: Patient with ASCVD/familial hyperlipidemia not at LDL goal of < 70 Most recent LDL 156 on 02/19/23  Not able to tolerate higher doses of statin secondary to myalgias Reviewed options for lowering LDL cholesterol, including ezetimibe, PCSK-9 inhibitors, bempedoic acid and inclisiran.  Discussed mechanisms of action, dosing, side effects, potential decreases in LDL cholesterol and costs.  Also reviewed potential options for patient assistance.  Plan: Patient agreeable to getting Coronary calcium scoring to further determine coronary risk Restart atorvastatin 20 mg once daily Will talk to patient after calcium score results to determine what best course of treatment will be.

## 2023-03-07 ENCOUNTER — Other Ambulatory Visit: Payer: Self-pay

## 2023-03-07 ENCOUNTER — Emergency Department (HOSPITAL_BASED_OUTPATIENT_CLINIC_OR_DEPARTMENT_OTHER)
Admission: EM | Admit: 2023-03-07 | Discharge: 2023-03-07 | Disposition: A | Discharge status: Home / Self Care | Payer: BC Managed Care – PPO | Attending: Emergency Medicine | Admitting: Emergency Medicine

## 2023-03-07 ENCOUNTER — Encounter (HOSPITAL_BASED_OUTPATIENT_CLINIC_OR_DEPARTMENT_OTHER): Payer: Self-pay

## 2023-03-07 ENCOUNTER — Emergency Department (HOSPITAL_BASED_OUTPATIENT_CLINIC_OR_DEPARTMENT_OTHER): Payer: BC Managed Care – PPO

## 2023-03-07 DIAGNOSIS — R079 Chest pain, unspecified: Secondary | ICD-10-CM | POA: Diagnosis not present

## 2023-03-07 DIAGNOSIS — E119 Type 2 diabetes mellitus without complications: Secondary | ICD-10-CM | POA: Insufficient documentation

## 2023-03-07 DIAGNOSIS — I10 Essential (primary) hypertension: Secondary | ICD-10-CM | POA: Insufficient documentation

## 2023-03-07 DIAGNOSIS — Z1152 Encounter for screening for COVID-19: Secondary | ICD-10-CM | POA: Diagnosis not present

## 2023-03-07 DIAGNOSIS — R519 Headache, unspecified: Secondary | ICD-10-CM | POA: Insufficient documentation

## 2023-03-07 DIAGNOSIS — Z7982 Long term (current) use of aspirin: Secondary | ICD-10-CM | POA: Insufficient documentation

## 2023-03-07 DIAGNOSIS — R0602 Shortness of breath: Secondary | ICD-10-CM | POA: Insufficient documentation

## 2023-03-07 DIAGNOSIS — Z79899 Other long term (current) drug therapy: Secondary | ICD-10-CM | POA: Diagnosis not present

## 2023-03-07 DIAGNOSIS — E039 Hypothyroidism, unspecified: Secondary | ICD-10-CM | POA: Insufficient documentation

## 2023-03-07 LAB — TROPONIN I (HIGH SENSITIVITY)
Troponin I (High Sensitivity): 2 ng/L (ref <18)
Troponin I (High Sensitivity): 2 ng/L (ref <18)

## 2023-03-07 LAB — BASIC METABOLIC PANEL
Anion gap: 8 (ref 5–15)
BUN: 13 mg/dL (ref 6–20)
CO2: 25 mmol/L (ref 22–32)
Calcium: 8.3 mg/dL — ABNORMAL LOW (ref 8.9–10.3)
Chloride: 105 mmol/L (ref 98–111)
Creatinine, Ser: 0.82 mg/dL (ref 0.44–1.00)
GFR, Estimated: >60 mL/min (ref >60)
Glucose, Bld: 127 mg/dL — ABNORMAL HIGH (ref 70–99)
Potassium: 3.4 mmol/L — ABNORMAL LOW (ref 3.5–5.1)
Sodium: 138 mmol/L (ref 135–145)

## 2023-03-07 LAB — CBC
HCT: 35.8 % — ABNORMAL LOW (ref 36.0–46.0)
Hemoglobin: 12 g/dL (ref 12.0–15.0)
MCH: 27 pg (ref 26.0–34.0)
MCHC: 33.5 g/dL (ref 30.0–36.0)
MCV: 80.6 fL (ref 80.0–100.0)
Platelets: 211 10*3/uL (ref 150–400)
RBC: 4.44 MIL/uL (ref 3.87–5.11)
RDW: 12.9 % (ref 11.5–15.5)
WBC: 4.8 10*3/uL (ref 4.0–10.5)
nRBC: 0 % (ref 0.0–0.2)

## 2023-03-07 LAB — SARS CORONAVIRUS 2 BY RT PCR: SARS Coronavirus 2 by RT PCR: NEGATIVE

## 2023-03-07 LAB — D-DIMER, QUANTITATIVE: D-Dimer, Quant: 0.89 ug{FEU}/mL — ABNORMAL HIGH (ref 0.00–0.50)

## 2023-03-07 MED ORDER — ALBUTEROL SULFATE HFA 108 (90 BASE) MCG/ACT IN AERS
1.0000 | INHALATION_SPRAY | Freq: Four times a day (QID) | RESPIRATORY_TRACT | 0 refills | Status: AC | PRN
Start: 2023-03-07 — End: ?

## 2023-03-07 MED ORDER — MAGNESIUM SULFATE 2 GM/50ML IV SOLN
2.0000 g | Freq: Once | INTRAVENOUS | Status: AC
  Administered 2023-03-07: 2 g via INTRAVENOUS
  Filled 2023-03-07: qty 50

## 2023-03-07 MED ORDER — IOHEXOL 350 MG/ML SOLN
100.0000 mL | Freq: Once | INTRAVENOUS | Status: AC | PRN
  Administered 2023-03-07: 90 mL via INTRAVENOUS

## 2023-03-07 MED ORDER — METHYLPREDNISOLONE SODIUM SUCC 125 MG IJ SOLR
125.0000 mg | Freq: Once | INTRAMUSCULAR | Status: AC
  Administered 2023-03-07: 125 mg via INTRAVENOUS
  Filled 2023-03-07: qty 2

## 2023-03-07 MED ORDER — IPRATROPIUM-ALBUTEROL 0.5-2.5 (3) MG/3ML IN SOLN
3.0000 mL | Freq: Once | RESPIRATORY_TRACT | Status: AC
  Administered 2023-03-07: 3 mL via RESPIRATORY_TRACT
  Filled 2023-03-07: qty 3

## 2023-03-07 NOTE — Discharge Instructions (Addendum)
Please follow-up with the pulmonologist that attached your for you today along with your primary care provider.  Today your labs and imaging were all reassuring.  You most likely have a viral illness.  Please pick up the albuterol prescription I have sent in for you.  Please take Tylenol every 6 hours as needed for pain is unchanged worsen please return to ER.

## 2023-03-07 NOTE — ED Notes (Signed)
Patient verbalizes understanding of discharge instructions. Opportunity for questioning and answers were provided. Pt & family comfortable w discharge plan

## 2023-03-07 NOTE — ED Triage Notes (Signed)
Pov from home, A&O x 4, gcs 15, amb to triage  Pt c/o headache and chest pain that started approx 1pm today, sts posterior headache and midsternal chest pain that doesn't radiate. Associated nausea.

## 2023-03-07 NOTE — ED Provider Notes (Signed)
[facility] Provider Note   CSN: [id number] Arrival date & time: 03/07/23  1924     History  Chief Complaint  Patient presents with   Chest Pain   Headache    Marilyn [name] is a 57 y.o. female history of hypothyroidism, diabetes, hypertension presented for shortness of breath and chest pain that began earlier today.  Patient notes that she was having walnuts and a salad and began to feel short of breath after this.  Patient was finding difficult to breathe but states she is not wheezing.  Patient has not used her albuterol inhaler.  Patient also notes that she had a headache while this was going on as well but denies any sick contacts and is not able to fully describe the headache on if it is bilateral or unilateral.  Patient is not taking any pain meds at this time.  Patient states chest pain is substernal but does not radiate.  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  Patient denies hemoptysis, leg swelling, recent travel/hospitalization/surgery, estrogen use, previous blood clots, fevers  Home Medications Prior to Admission medications   Medication Sig Start Date End Date Taking? Authorizing Provider  acetaminophen (TYLENOL) 500 MG tablet Take 1 tablet (500 mg total) by mouth every 6 (six) hours as needed. 06/07/22   Derwood Kaplan, MD  albuterol (VENTOLIN HFA) 108 (90 Base) MCG/ACT inhaler Inhale 1-2 puffs into the lungs every 6 (six) hours as needed for wheezing or shortness of breath. 03/07/23   Netta Corrigan, PA-C  amLODipine (NORVASC) 10 MG tablet TAKE 1 TABLET BY MOUTH DAILY 02/14/23   Deeann Saint, MD  aspirin EC 81 MG tablet Take 1 tablet (81 mg total) by mouth daily. Swallow whole. 03/30/20   Christell Constant, MD  Beclomethasone Dipropionate 40 MCG/ACT AERS Place 1 spray into the nose in  the morning and at bedtime. 03/23/21   Deeann Saint, MD  Blood Glucose Monitoring Suppl (ONETOUCH VERIO FLEX SYSTEM) w/Device KIT Use to check blood glucose TID or prn 07/10/21   Deeann Saint, MD  Blood Pressure Monitoring (COMFORT TOUCH BP CUFF/LARGE) MISC Please measure blood pressure 1 time per day 1-2 hours after taking medication and resting for 5-10 minutes, please keep a log and bring to all future appointments. DX I10 12/18/21   Chilton Si, MD  cetirizine (ZYRTEC) 10 MG tablet Take 1 tablet (10 mg total) by mouth daily. 06/07/22   Derwood Kaplan, MD  Cholecalciferol (VITAMIN D) 125 MCG (5000 UT) CAPS Take 1 tablet by mouth daily.    [provider]  cyclobenzaprine (FLEXERIL) 5 MG tablet Take 1 tablet (5 mg total) by mouth 3 (three) times daily as needed. 12/27/17   Wallis Bamberg, PA-C  EPIPEN 2-PAK 0.3 MG/0.3ML SOAJ injection Inject 0.3 mg into the muscle as needed for anaphylaxis. 07/25/21   Bradd Canary, MD  glucose blood (ONETOUCH VERIO) test strip TEST AS DIRECTED THREE TIMES A DAY 10/31/22   Deeann Saint, MD  ibuprofen (ADVIL) 600 MG tablet Take 1 tablet (600 mg total) by mouth every 6 (six) hours as needed. 06/07/22   Derwood Kaplan, MD  Lancets (ONETOUCH DELICA PLUS LANCET30G) MISC Check  3 times daily. 07/31/22   Deeann Saint, MD  levothyroxine (SYNTHROID) 100 MCG tablet TAKE 1 TABLET BY MOUTH DAILY  02/14/23   Deeann Saint, MD  metoprolol succinate (TOPROL-XL) 25 MG 24 hr tablet Take 1 tablet (25 mg total) by mouth daily. 02/25/23   Chilton Si, MD  omeprazole (PRILOSEC) 40 MG capsule TAKE ONE CAPSULE BY MOUTH TWO TIMES A DAY 01/14/23   Meryl Dare, MD  ondansetron (ZOFRAN) 4 MG tablet Take 1 tablet (4 mg total) by mouth every 8 (eight) hours as needed for nausea or vomiting. 01/24/23   Deeann Saint, MD  ondansetron (ZOFRAN-ODT) 4 MG disintegrating tablet Take 1 tablet (4 mg total) by mouth every 8 (eight) hours as needed for nausea or vomiting.  07/30/19   Benjiman Core, MD  vitamin B-12 (CYANOCOBALAMIN) 500 MCG tablet Take 500 mcg by mouth daily.    [provider]      Allergies    Promethazine-dm, [place] Bing allergy], Codeine, Fluticasone propionate, Promethazine, Atorvastatin, and Flonase [fluticasone propionate]    Review of Systems   Review of Systems  Cardiovascular:  Positive for chest pain.  Neurological:  Positive for headaches.    Physical Exam Updated Vital Signs BP 116/77   Pulse 78   Temp 98 F (36.7 C)   Resp (!) 23   Ht 5\' 3"  (1.6 m)   Wt 90.3 kg   LMP 02/21/2013   SpO2 92%   BMI 35.25 kg/m  Physical Exam Vitals reviewed.  Constitutional:      General: She is in acute distress.  HENT:     Head: Normocephalic and atraumatic.  Eyes:     Extraocular Movements: Extraocular movements intact.     Conjunctiva/sclera: Conjunctivae normal.     Pupils: Pupils are equal, round, and reactive to light.  Cardiovascular:     Rate and Rhythm: Normal rate and regular rhythm.     Pulses: Normal pulses.     Heart sounds: Normal heart sounds.     Comments: 2+ bilateral radial/dorsalis pedis pulses with regular rate Pulmonary:     Effort: No respiratory distress.     Breath sounds: Normal breath sounds.     Comments: Increased work of breathing Abdominal:     Palpations: Abdomen is soft.     Tenderness: There is no abdominal tenderness. There is no guarding or rebound.  Musculoskeletal:        General: Normal range of motion.     Cervical back: Normal range of motion and neck supple.     Right lower leg: No edema.     Left lower leg: No edema.     Comments: 5 out of 5 bilateral grip/leg extension strength No calf tenderness noted  Skin:    General: Skin is warm and dry.     Capillary Refill: Capillary refill takes less than 2 seconds.  Neurological:     General: No focal deficit present.     Mental Status: She is alert and oriented to person, place, and time.     Comments: Sensation intact  in all 4 limbs  Psychiatric:        Mood and Affect: Mood normal.     ED Results / Procedures / Treatments   Labs (all labs ordered are listed, but only abnormal results are displayed) Labs Reviewed  BASIC METABOLIC PANEL - Abnormal; Notable for the following components:      Result Value   Potassium 3.4 (*)    Glucose, Bld 127 (*)    Calcium 8.3 (*)    All other components within normal limits  CBC - Abnormal;  Notable for the following components:   HCT 35.8 (*)    All other components within normal limits  D-DIMER, QUANTITATIVE - Abnormal; Notable for the following components:   D-Dimer, Quant 0.89 (*)    All other components within normal limits  SARS CORONAVIRUS 2 BY RT PCR  TROPONIN I (HIGH SENSITIVITY)  TROPONIN I (HIGH SENSITIVITY)    EKG None  Radiology CT Angio Chest PE W/Cm &/Or Wo Cm  Result Date: 03/07/2023 CLINICAL DATA:  Positive D-dimer and chest pain, initial encounter EXAM: CT ANGIOGRAPHY CHEST WITH CONTRAST TECHNIQUE: Multidetector CT imaging of the chest was performed using the standard protocol during bolus administration of intravenous contrast. Multiplanar CT image reconstructions and MIPs were obtained to evaluate the vascular anatomy. RADIATION DOSE REDUCTION: This exam was performed according to the departmental dose-optimization program which includes automated exposure control, adjustment of the mA and/or kV according to patient size and/or use of iterative reconstruction technique. CONTRAST:  90mL OMNIPAQUE IOHEXOL 350 MG/ML SOLN COMPARISON:  None Available. FINDINGS: Cardiovascular: Thoracic aorta is within normal limits. Thoracic aorta shows no aneurysmal dilatation or dissection. No cardiac enlargement is seen. The pulmonary artery shows a normal branching pattern bilaterally. No intraluminal filling defect to suggest pulmonary embolism is seen. No significant coronary calcifications are noted. Mediastinum/Nodes: Thoracic inlet is within normal limits.  No hilar or mediastinal adenopathy is noted. The esophagus is within normal limits. Lungs/Pleura: Lungs are clear. No pleural effusion or pneumothorax. Upper Abdomen: Visualized upper abdomen shows no acute abnormality. Musculoskeletal: No chest wall abnormality. No acute or significant osseous findings. Review of the MIP images confirms the above findings. IMPRESSION: No evidence of pulmonary emboli. No acute abnormality noted. Electronically Signed   By: Alcide Clever M.D.   On: 03/07/2023 21:52   DG Chest 1 View  Result Date: 03/07/2023 CLINICAL DATA:  Chest pain and shortness of breath. EXAM: CHEST  1 VIEW COMPARISON:  April 20, 2022 FINDINGS: The heart size and mediastinal contours are within normal limits. Low lung volumes are noted. Both lungs are clear. Multilevel degenerative changes seen throughout the thoracic spine. IMPRESSION: Low lung volumes without acute or active cardiopulmonary disease. Electronically Signed   By: Aram Candela M.D.   On: 03/07/2023 20:23    Procedures Procedures    Medications Ordered in ED Medications  ipratropium-albuterol (DUONEB) 0.5-2.5 (3) MG/3ML nebulizer solution 3 mL (3 mLs Nebulization Given 03/07/23 2059)  methylPREDNISolone sodium succinate (SOLU-MEDROL) 125 mg/2 mL injection 125 mg (125 mg Intravenous Given 03/07/23 2102)  magnesium sulfate IVPB 2 g 50 mL (0 g Intravenous Stopped 03/07/23 2211)  iohexol (OMNIPAQUE) 350 MG/ML injection 100 mL (90 mLs Intravenous Contrast Given 03/07/23 2130)    ED Course/ Medical Decision Making/ A&P                                 Medical Decision Making Amount and/or Complexity of Data Reviewed Labs: ordered. Radiology: ordered.  Risk Prescription drug management.   Dominga Mcduffie 57 y.o. presented today for shortness of breath.  Working DDx that I considered at this time includes, but not limited to, asthma/COPD exacerbation, URI, viral illness, anemia, ACS, PE, pneumonia, pleural effusion,  lung cancer.  R/o DDx: asthma/COPD exacerbation, anemia, ACS, PE, pneumonia, pleural effusion, lung cancer: These are considered less likely due to history of present illness, physical exam, labs/imaging findings  Review of prior external notes: 02/27/2023 office visit  Unique Tests and  My Interpretation:  CBC: Unremarkable BMP: Unremarkable EKG: Sinus 86 bpm, possible first-degree heart block noted, no ST elevations or blocks noted Troponin: 2, 2 CXR: No acute findings CTA Chest PE: No acute findings COVID: Negative D-dimer: 0.89  Discussion with Independent Historian:  Daughter  Discussion of Management of Tests: None  Risk: Medium: prescription drug management  Risk Stratification Score:  None  Plan: On exam patient was initially in distress with increased work of breathing however had clear lungs bilaterally and no signs of facial or neck swelling.  Patient's labs in triage are reassuring and patient stated at 1 point she had walnuts and then start having symptoms which suspicious of possible allergic reaction.  Patient does have allergy to steroids in the EMR however patient verbally declined this and states that she wants to have the steroids.  Patient given mag steroids and DuoNeb.  D-dimer was added on as patient was breathing heavier than expected and had clear lungs which did come back elevated and a CTA was ordered which was ultimately negative.  Delta troponins were negative.  Patient is been in the ER for 3 and half hours and is improved with the medications.  Daughter does state that she has a "tight " esophagus and that this happens often.  Patient does not see pulmonology and so mother history of asthma we will send ambulatory referral to pulmonology and encouraged patient to follow-up primary care provider to take anti-inflammatories every 6 hours and monitor symptoms and change or worsen to return to ER.  At patient's request we will refill her albuterol  inhaler.  Patient was given return precautions. Patient stable for discharge at this time.  Patient verbalized understanding of plan.  This chart was dictated using voice recognition software.  Despite best efforts to proofread,  errors can occur which can change the documentation meaning.         Final Clinical Impression(s) / ED Diagnoses Final diagnoses:  Shortness of breath    Rx / DC Orders ED Discharge Orders          Ordered    Ambulatory referral to Pulmonology        03/07/23 2251    albuterol (VENTOLIN HFA) 108 (90 Base) MCG/ACT inhaler  Every 6 hours PRN        03/07/23 2259              Remi Deter 03/07/23 2303    Lorre Nick, MD 03/08/23 1539

## 2023-03-28 ENCOUNTER — Ambulatory Visit (HOSPITAL_BASED_OUTPATIENT_CLINIC_OR_DEPARTMENT_OTHER)
Admission: RE | Admit: 2023-03-28 | Discharge: 2023-03-28 | Disposition: A | Discharge status: Home / Self Care | Payer: BC Managed Care – PPO | Source: Ambulatory Visit | Attending: Cardiovascular Disease | Admitting: Cardiovascular Disease

## 2023-03-28 DIAGNOSIS — I10 Essential (primary) hypertension: Secondary | ICD-10-CM | POA: Insufficient documentation

## 2023-03-30 ENCOUNTER — Other Ambulatory Visit: Payer: Self-pay | Admitting: Family Medicine

## 2023-04-15 ENCOUNTER — Telehealth: Payer: Self-pay | Admitting: Family Medicine

## 2023-04-15 ENCOUNTER — Ambulatory Visit: Payer: BC Managed Care – PPO | Admitting: Psychiatry

## 2023-04-15 ENCOUNTER — Telehealth: Payer: Self-pay | Admitting: Cardiovascular Disease
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
## 2023-04-15 NOTE — Telephone Encounter (Signed)
This provider made aware pt called office requesting appointment however no availability today or tomorrow with this provider.  Unclear on conversation between triage and patient.  This provider's nurse attempted to contact patient regarding current symptoms and appointment availability but patient was less than cooperative and hung up.  Per chart review patient also contacted cardiology this morning stating this provider would not see her until she went to the ED which is false.

## 2023-04-15 NOTE — Telephone Encounter (Signed)
Pt was told to go to the ER but refuse to go.

## 2023-04-15 NOTE — Telephone Encounter (Signed)
Called to speak with patient and patient stated that Dr. Salomon Fick is not going to see her and she is not going to the ED and patient hung up the phone.
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
## 2023-04-18 ENCOUNTER — Emergency Department (HOSPITAL_BASED_OUTPATIENT_CLINIC_OR_DEPARTMENT_OTHER)
Admission: EM | Admit: 2023-04-18 | Discharge: 2023-04-18 | Disposition: A | Discharge status: Home / Self Care | Payer: BC Managed Care – PPO | Attending: Emergency Medicine | Admitting: Emergency Medicine

## 2023-04-18 ENCOUNTER — Other Ambulatory Visit: Payer: Self-pay

## 2023-04-18 ENCOUNTER — Emergency Department (HOSPITAL_BASED_OUTPATIENT_CLINIC_OR_DEPARTMENT_OTHER): Payer: BC Managed Care – PPO | Admitting: Radiology

## 2023-04-18 ENCOUNTER — Encounter (HOSPITAL_BASED_OUTPATIENT_CLINIC_OR_DEPARTMENT_OTHER): Payer: Self-pay | Admitting: Emergency Medicine

## 2023-04-18 DIAGNOSIS — M25531 Pain in right wrist: Secondary | ICD-10-CM | POA: Diagnosis present

## 2023-04-18 DIAGNOSIS — Z7982 Long term (current) use of aspirin: Secondary | ICD-10-CM | POA: Diagnosis not present

## 2023-04-18 DIAGNOSIS — X58XXXA Exposure to other specified factors, initial encounter: Secondary | ICD-10-CM | POA: Insufficient documentation

## 2023-04-18 DIAGNOSIS — E039 Hypothyroidism, unspecified: Secondary | ICD-10-CM | POA: Diagnosis not present

## 2023-04-18 DIAGNOSIS — M7711 Lateral epicondylitis, right elbow: Secondary | ICD-10-CM | POA: Diagnosis not present

## 2023-04-18 DIAGNOSIS — S63501A Unspecified sprain of right wrist, initial encounter: Secondary | ICD-10-CM | POA: Insufficient documentation

## 2023-04-18 DIAGNOSIS — I10 Essential (primary) hypertension: Secondary | ICD-10-CM | POA: Insufficient documentation

## 2023-04-18 DIAGNOSIS — Z79899 Other long term (current) drug therapy: Secondary | ICD-10-CM | POA: Diagnosis not present

## 2023-04-18 MED ORDER — NAPROXEN 500 MG PO TABS: 500.0000 mg | ORAL_TABLET | Freq: Two times a day (BID) | ORAL | 0 refills | Status: DC

## 2023-04-18 MED ORDER — KETOROLAC TROMETHAMINE 15 MG/ML IJ SOLN
15.0000 mg | Freq: Once | INTRAMUSCULAR | Status: DC
  Filled 2023-04-18: qty 1

## 2023-04-18 MED ORDER — KETOROLAC TROMETHAMINE 15 MG/ML IJ SOLN
15.0000 mg | Freq: Once | INTRAMUSCULAR | Status: AC
  Administered 2023-04-18: 15 mg via INTRAMUSCULAR

## 2023-04-18 MED ORDER — ACETAMINOPHEN 500 MG PO TABS
1000.0000 mg | ORAL_TABLET | Freq: Once | ORAL | Status: AC
  Administered 2023-04-18: 1000 mg via ORAL
  Filled 2023-04-18: qty 2

## 2023-04-18 NOTE — Discharge Instructions (Addendum)
You were seen in the emergency room for elbow and wrist pain.  Your exam is consistent with lateral epicondylitis.  I recommended getting an elbow strap for compression or elbow sleeve.  Make sure taking anti-inflammatories regularly including naproxen which have sent to your pharmacy.  You can also apply ice and heat over the area.  Make sure you continue taking Tylenol.  You also have findings on x-ray consistent with wrist sprain.  I have prescribed wrist brace however I would like you to follow-up with sports medicine for further evaluation of symptoms. Please return to emergency room if you have any new or worsening symptoms.

## 2023-04-18 NOTE — ED Triage Notes (Signed)
Right arm wrist to shoulder pain Started about 1.5weeks ago. Seen at [facility], xray done at [facility]. Neg Continued pain. Worse with activity/ movement
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
## 2023-04-27 NOTE — Telephone Encounter (Signed)
Care team updated. Will send for records.

## 2023-05-09 ENCOUNTER — Other Ambulatory Visit: Payer: Self-pay

## 2023-05-09 ENCOUNTER — Encounter (HOSPITAL_COMMUNITY): Payer: Self-pay | Admitting: *Deleted

## 2023-05-09 ENCOUNTER — Emergency Department (HOSPITAL_COMMUNITY): Payer: BC Managed Care – PPO

## 2023-05-09 ENCOUNTER — Emergency Department (HOSPITAL_COMMUNITY)
Admission: EM | Admit: 2023-05-09 | Discharge: 2023-05-09 | Disposition: A | Discharge status: Home / Self Care | Payer: BC Managed Care – PPO | Attending: Emergency Medicine | Admitting: Emergency Medicine

## 2023-05-09 DIAGNOSIS — E119 Type 2 diabetes mellitus without complications: Secondary | ICD-10-CM | POA: Diagnosis not present

## 2023-05-09 DIAGNOSIS — I10 Essential (primary) hypertension: Secondary | ICD-10-CM | POA: Insufficient documentation

## 2023-05-09 DIAGNOSIS — R079 Chest pain, unspecified: Secondary | ICD-10-CM | POA: Insufficient documentation

## 2023-05-09 DIAGNOSIS — Z7982 Long term (current) use of aspirin: Secondary | ICD-10-CM | POA: Diagnosis not present

## 2023-05-09 DIAGNOSIS — Z79899 Other long term (current) drug therapy: Secondary | ICD-10-CM | POA: Diagnosis not present

## 2023-05-09 LAB — CBC
HCT: 36.1 % (ref 36.0–46.0)
Hemoglobin: 11.4 g/dL — ABNORMAL LOW (ref 12.0–15.0)
MCH: 26.1 pg (ref 26.0–34.0)
MCHC: 31.6 g/dL (ref 30.0–36.0)
MCV: 82.6 fL (ref 80.0–100.0)
Platelets: 229 10*3/uL (ref 150–400)
RBC: 4.37 MIL/uL (ref 3.87–5.11)
RDW: 13.7 % (ref 11.5–15.5)
WBC: 4.4 10*3/uL (ref 4.0–10.5)
nRBC: 0 % (ref 0.0–0.2)

## 2023-05-09 LAB — BASIC METABOLIC PANEL
Anion gap: 7 (ref 5–15)
BUN: 15 mg/dL (ref 6–20)
CO2: 25 mmol/L (ref 22–32)
Calcium: 8.4 mg/dL — ABNORMAL LOW (ref 8.9–10.3)
Chloride: 108 mmol/L (ref 98–111)
Creatinine, Ser: 0.82 mg/dL (ref 0.44–1.00)
GFR, Estimated: >60 mL/min (ref >60)
Glucose, Bld: 101 mg/dL — ABNORMAL HIGH (ref 70–99)
Potassium: 3.6 mmol/L (ref 3.5–5.1)
Sodium: 140 mmol/L (ref 135–145)

## 2023-05-09 LAB — HEPATIC FUNCTION PANEL
ALT: 19 U/L (ref 0–44)
AST: 22 U/L (ref 15–41)
Albumin: 3.4 g/dL — ABNORMAL LOW (ref 3.5–5.0)
Alkaline Phosphatase: 58 U/L (ref 38–126)
Bilirubin, Direct: 0.2 mg/dL (ref 0.0–0.2)
Indirect Bilirubin: 0.6 mg/dL (ref 0.3–0.9)
Total Bilirubin: 0.8 mg/dL (ref <1.2)
Total Protein: 7 g/dL (ref 6.5–8.1)

## 2023-05-09 LAB — TROPONIN I (HIGH SENSITIVITY)
Troponin I (High Sensitivity): <2 ng/L (ref <18)
Troponin I (High Sensitivity): <2 ng/L (ref <18)

## 2023-05-09 LAB — LIPASE, BLOOD: Lipase: 38 U/L (ref 11–51)

## 2023-05-09 MED ORDER — ALUM & MAG HYDROXIDE-SIMETH 200-200-20 MG/5ML PO SUSP
30.0000 mL | Freq: Once | ORAL | Status: AC
  Administered 2023-05-09: 30 mL via ORAL
  Filled 2023-05-09: qty 30

## 2023-05-09 MED ORDER — KETOROLAC TROMETHAMINE 15 MG/ML IJ SOLN
15.0000 mg | Freq: Once | INTRAMUSCULAR | Status: AC
  Administered 2023-05-09: 15 mg via INTRAVENOUS
  Filled 2023-05-09: qty 1

## 2023-05-09 NOTE — Progress Notes (Signed)
Cardiology on-call:  ED provider reached out to discuss her case as she presented to the hospital with a chief complaint of chest pain.  Patient was at work  Chest pain this is similar to her GERD like symptoms.  The discomfort is not brought on by effort related activities and does not resolve with rest.  She had a coronary CTA back in November 2021 which noted minimal CAD. She also had a repeat coronary calcium score which was 0. I sensitive troponins were negative x 2 and EKG did not illustrate STEMI.  From a cardiovascular standpoint the discomfort is likely noncardiac and no additional workup is warranted inpatient.  I have asked her to follow-up with her primary cardiologist and/or APP in 2 to 4 weeks for reevaluation of symptoms.  She has an appointment set up now with [facility] walker on June 07, 2023.  No additional recommendations warranted at this time.  Of note, ED PA notes that she has history of esophageal strictures in the past requiring dilatation.  This is also on the differential diagnosis given her symptoms.  Recommended outpatient GI follow-up as well.  Marilyn Kook Hopkins, DO, [facility]

## 2023-05-09 NOTE — ED Provider Notes (Signed)
EMERGENCY DEPARTMENT AT [facility] Provider Note   CSN: [id number] Arrival date & time: 05/09/23  1039     History  Chief Complaint  Patient presents with   Chest Pain    Marilyn [name] is a 57 y.o. female.  HPI 57 year old female with a history of hyperthyroidism, esophageal stricture s/p dilation, esophageal dysmotility, GERD, DM type II, hyperlipidemia, hypertension presents to the ER with complaints of chest pain.  She was sitting at her desk this morning while at work when she felt sudden sharp stabbing right-sided chest pain that was 10/10.  She initially thought it was her GERD as she had eaten a cinnamon roll 2 hours prior to onset.  She took some omeprazole and the pain seemed to subside.  She then stated that the pain returned just as severe, she started to feel palpitations, went to the bathroom and had a presyncopal event.  She also reported associated shortness of breath.  She states that the chest pain has now slightly subsided and she does not feel short of breath.  She states that she has been having issues with right shoulder pain but reports that this has been worked up and seems to be secondary to arthritis.  Per chart review, patient was found to have a small pericardial effusion in September 2023, underwent workup for amyloidosis which was negative.  She had a coronary CT in October 24 which was negative.  She denies any lower extremity edema, recent travel.  Per chart review, she was here in October with shortness of breath, underwent a PE study which was negative.  She reports that she does follow with Dr. Duke Salvia with cardiology.  Denies any cough, fevers, neck stiffness, nausea, vomiting, diarrhea, dizziness, syncope     Home Medications Prior to Admission medications   Medication Sig Start Date End Date Taking? Authorizing Provider  acetaminophen (TYLENOL) 500 MG tablet Take 1 tablet (500 mg total) by mouth every 6 (six) hours as  needed. 06/07/22   Derwood Kaplan, MD  albuterol (VENTOLIN HFA) 108 (90 Base) MCG/ACT inhaler Inhale 1-2 puffs into the lungs every 6 (six) hours as needed for wheezing or shortness of breath. 03/07/23   Netta Corrigan, PA-C  amLODipine (NORVASC) 10 MG tablet TAKE 1 TABLET BY MOUTH DAILY 02/14/23   Deeann Saint, MD  aspirin EC 81 MG tablet Take 1 tablet (81 mg total) by mouth daily. Swallow whole. 03/30/20   Christell Constant, MD  Beclomethasone Dipropionate 40 MCG/ACT AERS Place 1 spray into the nose in the morning and at bedtime. 03/23/21   Deeann Saint, MD  Blood Glucose Monitoring Suppl (ONETOUCH VERIO FLEX SYSTEM) w/Device KIT Use to check blood glucose TID or prn 07/10/21   Deeann Saint, MD  Blood Pressure Monitoring (COMFORT TOUCH BP CUFF/LARGE) MISC Please measure blood pressure 1 time per day 1-2 hours after taking medication and resting for 5-10 minutes, please keep a log and bring to all future appointments. DX I10 12/18/21   Chilton Si, MD  cetirizine (ZYRTEC) 10 MG tablet Take 1 tablet (10 mg total) by mouth daily. 06/07/22   Derwood Kaplan, MD  Cholecalciferol (VITAMIN D) 125 MCG (5000 UT) CAPS Take 1 tablet by mouth daily.    [provider]  cyclobenzaprine (FLEXERIL) 5 MG tablet Take 1 tablet (5 mg total) by mouth 3 (three) times daily as needed. 12/27/17   Wallis Bamberg, PA-C  EPIPEN 2-PAK 0.3 MG/0.3ML SOAJ injection Inject 0.3 mg  into the muscle as needed for anaphylaxis. 07/25/21   Bradd Canary, MD  glucose blood (ONETOUCH VERIO) test strip TEST AS DIRECTED THREE TIMES A DAY 10/31/22   Deeann Saint, MD  ibuprofen (ADVIL) 600 MG tablet Take 1 tablet (600 mg total) by mouth every 6 (six) hours as needed. 06/07/22   Derwood Kaplan, MD  Lancets (ONETOUCH DELICA PLUS LANCET30G) MISC USE LANCETS THREE TIMES A DAY IN CONJUNCTION WITH BLOOD GLUCOSE MONITORING 04/01/23   Deeann Saint, MD  levothyroxine (SYNTHROID) 100 MCG tablet TAKE 1 TABLET BY MOUTH DAILY  02/14/23   Deeann Saint, MD  metoprolol succinate (TOPROL-XL) 25 MG 24 hr tablet Take 1 tablet (25 mg total) by mouth daily. 02/25/23   Chilton Si, MD  naproxen (NAPROSYN) 500 MG tablet Take 1 tablet (500 mg total) by mouth 2 (two) times daily. 04/18/23   Barrett, Horald Chestnut, PA-C  omeprazole (PRILOSEC) 40 MG capsule TAKE ONE CAPSULE BY MOUTH TWO TIMES A DAY 01/14/23   Meryl Dare, MD  ondansetron (ZOFRAN) 4 MG tablet Take 1 tablet (4 mg total) by mouth every 8 (eight) hours as needed for nausea or vomiting. 01/24/23   Deeann Saint, MD  ondansetron (ZOFRAN-ODT) 4 MG disintegrating tablet Take 1 tablet (4 mg total) by mouth every 8 (eight) hours as needed for nausea or vomiting. 07/30/19   Benjiman Core, MD  vitamin B-12 (CYANOCOBALAMIN) 500 MCG tablet Take 500 mcg by mouth daily.    [provider]      Allergies    Promethazine-dm, Ensign Bing allergy], Codeine, Fluticasone propionate, Promethazine, Atorvastatin, and Flonase [fluticasone propionate]    Review of Systems   Review of Systems Ten systems reviewed and are negative for acute change, except as noted in the HPI.   Physical Exam Updated Vital Signs BP 121/83   Pulse 68   Temp 98.3 F (36.8 C) (Oral)   Resp 17   LMP 02/21/2013   SpO2 98%  Physical Exam Vitals reviewed.  Constitutional:      Appearance: Normal appearance.  HENT:     Head: Normocephalic.  Cardiovascular:     Rate and Rhythm: Normal rate and regular rhythm.     Pulses: Normal pulses.     Heart sounds: Normal heart sounds, S1 normal and S2 normal. No murmur heard.    No friction rub.  Pulmonary:     Effort: Pulmonary effort is normal.     Breath sounds: Normal breath sounds. No decreased breath sounds, wheezing or rhonchi.  Chest:     Comments: No reproducible chest wall tenderness Abdominal:     General: Abdomen is flat.     Palpations: Abdomen is soft.  Musculoskeletal:     Right lower leg: No edema.     Left lower leg: No  edema.  Neurological:     Mental Status: She is alert.     ED Results / Procedures / Treatments   Labs (all labs ordered are listed, but only abnormal results are displayed) Labs Reviewed  BASIC METABOLIC PANEL - Abnormal; Notable for the following components:      Result Value   Glucose, Bld 101 (*)    Calcium 8.4 (*)    All other components within normal limits  CBC - Abnormal; Notable for the following components:   Hemoglobin 11.4 (*)    All other components within normal limits  HEPATIC FUNCTION PANEL - Abnormal; Notable for the following components:   Albumin 3.4 (*)  All other components within normal limits  LIPASE, BLOOD  TROPONIN I (HIGH SENSITIVITY)  TROPONIN I (HIGH SENSITIVITY)    EKG EKG Interpretation Date/Time:  Thursday May 09 2023 10:33:24 EST Ventricular Rate:  80 PR Interval:  204 QRS Duration:  70 QT Interval:  402 QTC Calculation: 463 R Axis:   50  Text Interpretation: Normal sinus rhythm Low voltage QRS Confirmed by Cathren Laine ([id number]) on 05/09/2023 11:52:11 AM  Radiology DG Chest 2 View  Result Date: 05/09/2023 CLINICAL DATA:  Chest pain EXAM: CHEST - 2 VIEW COMPARISON:  X-ray and CTA 03/07/2023. FINDINGS: No consolidation, pneumothorax or effusion. No edema. Normal cardiopericardial silhouette. Degenerative changes along the spine. IMPRESSION: No acute cardiopulmonary disease. Electronically Signed   By: Karen Kays M.D.   On: 05/09/2023 12:09    Procedures Procedures    Medications Ordered in ED Medications  alum & mag hydroxide-simeth (MAALOX/MYLANTA) 200-200-20 MG/5ML suspension 30 mL (30 mLs Oral Given 05/09/23 1203)  ketorolac (TORADOL) 15 MG/ML injection 15 mg (15 mg Intravenous Given 05/09/23 1502)    ED Course/ Medical Decision Making/ A&P Clinical Course as of 05/09/23 1524  Thu May 09, 2023  [id number] 57 year old female presents to the ER with chest pain.  On arrival, she is well-appearing, vitals overall reassuring.   Physical exam is unremarkable. Differential is broad, but cannot include ACS, PE, pericardial effusion/tamponade, CHF, esophageal spasm, GERD, anxiety, pneumonia    [MB]  1217 Labs ordered, chest x-ray ordered.  EKG without signs of ischemia [MB]  1411 BMP and CBC unremarkable, lipase normal, hepatic function panel normal.  Initial troponin undetectable.  Repeat troponin pending.  Chest x-ray reviewed, no acute findings.  She was given a GI cocktail.  I discussed the case with Dr. Odis Hollingshead with cardiology, given unremarkable CTA coronaries 3 years ago along with calcium score of 0, as long as her repeat troponin is negative she would likely be stable with outpatient cardiology follow-up with Dr. Brynda Greathouse.  Could also consider esophageal spasm/stricture [MB]  1518 Repeat troponin negative.  Patient also complaining of right elbow pain and weakness when trying to open jars.  She has intact strength and sensation on my exam.  She has pending hand surgery follow-up.  I encouraged her to follow-up with hand surgery.  Will give Toradol for pain. Overall workup reassuring. We discussed return precautions. She voiced understanding and is agreeable. Stable for discharge  [MB]    Clinical Course User Index [MB] Mare Ferrari, PA-C                                 Medical Decision Making Amount and/or Complexity of Data Reviewed Labs: ordered. Radiology: ordered.  Risk OTC drugs. Prescription drug management.           Final Clinical Impression(s) / ED Diagnoses Final diagnoses:  Chest pain, unspecified type    Rx / DC Orders ED Discharge Orders     None         Mare Ferrari, PA-C 05/09/23 1524    Cathren Laine, MD 05/10/23 1549

## 2023-05-09 NOTE — Discharge Instructions (Signed)
You were evaluated in the Emergency Department and after careful evaluation, we did not find any emergent condition requiring admission or further testing in the hospital.  Discussed please follow-up with cardiology as well as your GI doctor.  Also make sure to follow-up with your hand doctor regarding the issues with your elbow and grip strength.  Please return to the Emergency Department if you experience any worsening of your condition.  We encourage you to follow up with a primary care provider.  Thank you for allowing [place] to be a part of your care.

## 2023-05-09 NOTE — ED Triage Notes (Signed)
Sitting at her desk and had 3 episodes of sharp stabbing pain, caused short of breath and dizzy. EMS reported some anxiety upon their arrival. IV 18 LAC, Chest and stomach pain. Zofran 4mg . Recent sinus symptoms post anbx. BP 116/64, p-78

## 2023-05-10 ENCOUNTER — Telehealth: Payer: Self-pay | Admitting: Gastroenterology

## 2023-05-10 ENCOUNTER — Encounter (HOSPITAL_BASED_OUTPATIENT_CLINIC_OR_DEPARTMENT_OTHER): Payer: Self-pay

## 2023-05-10 NOTE — Telephone Encounter (Signed)
The pt states that she just wanted to have an appt scheduled to see Shanda Bumps and be put on a wait list.  I did confirm with her that the appt has been made and she will be called if there is an opening prior to that date.

## 2023-05-10 NOTE — Telephone Encounter (Signed)
Patient called and stated she need to make a follow up appointment with Doug Sou. I scheduled patient for 07/24/2023 at 2:30 pm. Patient stated she would still like to speak to Doug Sou about her ED visit. Please advise.

## 2023-05-14 ENCOUNTER — Other Ambulatory Visit: Payer: Self-pay | Admitting: Family Medicine

## 2023-05-14 DIAGNOSIS — I10 Essential (primary) hypertension: Secondary | ICD-10-CM

## 2023-05-16 ENCOUNTER — Encounter: Payer: Self-pay | Admitting: Family Medicine

## 2023-05-16 NOTE — Telephone Encounter (Signed)
 Care team updated and letter sent for eye exam notes.

## 2023-05-21 ENCOUNTER — Other Ambulatory Visit: Payer: Self-pay

## 2023-05-21 DIAGNOSIS — I10 Essential (primary) hypertension: Secondary | ICD-10-CM

## 2023-05-21 MED ORDER — AMLODIPINE BESYLATE 10 MG PO TABS: 10.0000 mg | ORAL_TABLET | Freq: Every day | ORAL | 0 refills | Status: DC

## 2023-05-30 ENCOUNTER — Encounter (HOSPITAL_BASED_OUTPATIENT_CLINIC_OR_DEPARTMENT_OTHER): Payer: BC Managed Care – PPO | Admitting: Certified Nurse Midwife

## 2023-05-30 ENCOUNTER — Other Ambulatory Visit: Payer: Self-pay | Admitting: Family Medicine

## 2023-05-30 DIAGNOSIS — E119 Type 2 diabetes mellitus without complications: Secondary | ICD-10-CM

## 2023-06-03 ENCOUNTER — Encounter: Payer: Self-pay | Admitting: Oncology

## 2023-06-06 ENCOUNTER — Encounter: Payer: Self-pay | Admitting: Oncology

## 2023-06-07 ENCOUNTER — Encounter (HOSPITAL_BASED_OUTPATIENT_CLINIC_OR_DEPARTMENT_OTHER): Payer: Self-pay | Admitting: Family

## 2023-06-07 ENCOUNTER — Ambulatory Visit (HOSPITAL_BASED_OUTPATIENT_CLINIC_OR_DEPARTMENT_OTHER): Payer: 59 | Admitting: Family

## 2023-06-07 VITALS — BP 112/64 | HR 85 | Ht 63.0 in | Wt 199.8 lb

## 2023-06-07 DIAGNOSIS — I10 Essential (primary) hypertension: Secondary | ICD-10-CM | POA: Diagnosis not present

## 2023-06-07 DIAGNOSIS — G4733 Obstructive sleep apnea (adult) (pediatric): Secondary | ICD-10-CM | POA: Diagnosis not present

## 2023-06-07 DIAGNOSIS — E7849 Other hyperlipidemia: Secondary | ICD-10-CM

## 2023-06-07 DIAGNOSIS — K219 Gastro-esophageal reflux disease without esophagitis: Secondary | ICD-10-CM

## 2023-06-07 MED ORDER — PANTOPRAZOLE SODIUM 40 MG PO TBEC: 40.0000 mg | DELAYED_RELEASE_TABLET | Freq: Two times a day (BID) | ORAL | 2 refills | Status: DC

## 2023-06-07 NOTE — Patient Instructions (Addendum)
 Medication Instructions:  Your physician has recommended you make the following change in your medication:   STOP OMEPRAZOLE  START PANTOPRAZOLE  40 MG TWICE A DAY  *If you need a refill on your cardiac medications before your next appointment, please call your pharmacy*   Follow-Up: At [facility], you and your health needs are our priority.  As part of our continuing mission to provide you with exceptional heart care, we have created designated Provider Care Teams.  These Care Teams include your primary Cardiologist (physician) and Advanced Practice Providers (APPs -  Physician Assistants and Nurse Practitioners) who all work together to provide you with the care you need, when you need it.  We recommend signing up for the patient portal called MyChart.  Sign up information is provided on this After Visit Summary.  MyChart is used to connect with patients for Virtual Visits (Telemedicine).  Patients are able to view lab/test results, encounter notes, upcoming appointments, etc.  Non-urgent messages can be sent to your provider as well.   To learn more about what you can do with MyChart, go to [email or web address].    Your next appointment:   May 2025  Provider:   Annabella Scarce, MD    Other Instructions  Your CT scan 03/2023 showed calcium  score of 0 which means there is no plaque build up in your heart arteries which is good!  Recommend resuming your Zyrtec  (Cetirizine ) to help with post nasal drip.   Recommend follow up with your eye doctor about eye drops.   Recommend follow up with neurology regarding headaches.   We have changed your Omeprazole  to Pantoprazole  today to try to help with your reflux symptoms. Follow up with gastroenterology for further workup. Recommend following a reflux-friendly diet.  We have referred you to pulmonary due to your sleep apnea.

## 2023-06-07 NOTE — Progress Notes (Signed)
 Cardiology Office Note:  .   Date:  06/07/2023  ID:  Marilyn [name], DOB [birth year], MRN [id number] PCP: Mercer Clotilda SAUNDERS, MD  [facility] Providers Cardiologist:  Annabella Scarce, MD    History of Present Illness: .   Marilyn [name] is a 58 y.o. female with history of HLD, aortic atherosclerosis, HTN, OSA, obesity, coronary calcification, hypothyroidism.  Cardiac CTA 04/2020 LAD minimal nonobstructive (1-35%) plaque at take off of the D1 vessel and minimal nonobstructive narrowing (1-25%) in the mid Cx. Sleep study 12/14/21 moderate ODSA AKI 28.5/hr overall and 36.3/hr during REM. O2 as low as 75%. PYp scan 03/2022 equivocal fro amyloidosis. Subsequent coronary calcium  score 03/2023 of 0. Previously did not tolerate higher doses of Atorvastatin .  ED visit 05/09/2023 with sharp stabbing right-sided chest pain which improved with omeprazole .  ED workup unremarkable.  Present today for follow-up independently with myriad of concerns.  No recurrent chest pain since ED visit.   She is taking blood pressure medications in the morning as did not tolerate taking in the evening. She has not worn CPAP in some times as has many concerns. She was referred to ENT for discussion of Inspire and while she has seen ENT does not recall discussing Inspire. She feels she cannot breath well when wearing CPAP while having present respiratory/post nasal drip concerns and is concerned about the cleanliness of CPAP long term. Prefers not to return to CPAP management with our sleep team and requests referral to pulmonology.   Non cardiac concerns: GERD. More recent burning in her stomach which she attributed to omeprazole . Discussed more likely that the OMeprazole  is not adequately controlling her GERD. Also feels it may be time to have her esophagus stretched again, has upcoming follow up with GI.  Headaches. Reports headaches were improved with PT but started coming back a week ago. Her prior  neurologist is no longer at the practice. Encouraged to schedule follow up with their office.  Eye drops. Started new eye drop ivizya and notes she had a red bump under her eye and on her right cheek. Encouraged to discuss with her eye doctor.  Post nasal drip. Not taking zyrtec  routinely, encouraged to resume and follow up with PCP. Reports allergy to Flonase .  ROS: Please see the history of present illness.    All other systems reviewed and are negative.   Studies Reviewed: .        Cardiac Studies & Procedures     STRESS TESTS  NM MYOCAR MULTI W/SPECT W 01/24/2017  Narrative CLINICAL DATA:  58 year old female presented number to prior with chest pain. Negative cardiac enzymes. Negative EKG  EXAM: MYOCARDIAL IMAGING WITH SPECT (REST AND PHARMACOLOGIC-STRESS)  GATED LEFT VENTRICULAR WALL MOTION STUDY  LEFT VENTRICULAR EJECTION FRACTION  TECHNIQUE: Standard myocardial SPECT imaging was performed after resting intravenous injection of 10 mCi Tc-36m tetrofosmin . Subsequently, intravenous infusion of Lexiscan  was performed under the supervision of the Cardiology staff. At peak effect of the drug, 30 mCi Tc-36m tetrofosmin  was injected intravenously and standard myocardial SPECT imaging was performed. Quantitative gated imaging was also performed to evaluate left ventricular wall motion, and estimate left ventricular ejection fraction.  COMPARISON:  None.  FINDINGS: EKG stress: No ST changes  Perfusion: There decrease counts in the mid and basilar segment of the inferior wall which are fixed on rest and stress. No foci of reversible ischemia evident. No wall motion abnormality.  Wall Motion: Normal left ventricular wall motion. No left ventricular dilation.  Left Ventricular Ejection Fraction: 61 %  End diastolic volume 79 ml  End systolic volume 30 ml  IMPRESSION: 1. No reversible ischemia. Fixed decrease counts in the inferior wall are favored artifactual, cannot  exclude scar.  2. Normal left ventricular wall motion.  3. Left ventricular ejection fraction 61%  4. Non invasive risk stratification*: Low  *2012 Appropriate Use Criteria for Coronary Revascularization Focused Update: J Am Coll Cardiol. 2012;59(9):857-881. http://content.dementiazones.com.aspx?articleid=1201161   Electronically Signed By: Jackquline Boxer M.D. On: 01/24/2017 15:58  ECHOCARDIOGRAM  ECHOCARDIOGRAM COMPLETE 06/06/2022  Narrative ECHOCARDIOGRAM REPORT    Patient Name:   Marilyn [name] Date of Exam: 06/06/2022 Medical Rec #:  [id number]              Height:       63.0 in Accession #:    [id number]             Weight:       201.0 lb Date of Birth:  1965-10-15               BSA:          1.938 m Patient Age:    56 years               BP:           126/74 mmHg Patient Gender: F                      HR:           85 bpm. Exam Location:  Outpatient  Procedure: 2D Echo, 3D Echo, Cardiac Doppler, Color Doppler and Strain Analysis  Indications:    Pericardial Effusion  History:        Patient has prior history of Echocardiogram examinations, most recent 08/03/2021. Risk Factors:Diabetes, Hypertension, Dyslipidemia and Former Smoker.  Sonographer:    Orvil Holmes RDCS Referring Phys: [id number] TIFFANY Limon  IMPRESSIONS   1. Left ventricular ejection fraction, by estimation, is 65 to 70%. The left ventricle has normal function. The left ventricle has no regional wall motion abnormalities. There is mild concentric left ventricular hypertrophy. Indeterminate diastolic filling due to E-A fusion. 2. Right ventricular systolic function is normal. The right ventricular size is normal. There is normal pulmonary artery systolic pressure. 3. No evidence of mitral valve regurgitation. 4. The aortic valve was not well visualized. Aortic valve regurgitation is not visualized. 5. The inferior vena cava is normal in size with greater than 50% respiratory  variability, suggesting right atrial pressure of 3 mmHg.  Comparison(s): No significant change from prior study.  FINDINGS Left Ventricle: Left ventricular ejection fraction, by estimation, is 65 to 70%. The left ventricle has normal function. The left ventricle has no regional wall motion abnormalities. The left ventricular internal cavity size was normal in size. There is mild concentric left ventricular hypertrophy. Indeterminate diastolic filling due to E-A fusion.  Right Ventricle: The right ventricular size is normal. Right ventricular systolic function is normal. There is normal pulmonary artery systolic pressure. The tricuspid regurgitant velocity is 2.28 m/s, and with an assumed right atrial pressure of 3 mmHg, the estimated right ventricular systolic pressure is 23.8 mmHg.  Left Atrium: Left atrial size was normal in size.  Right Atrium: Right atrial size was normal in size.  Pericardium: Trivial pericardial effusion is present.  Mitral Valve: No evidence of mitral valve regurgitation.  Tricuspid Valve: Tricuspid valve regurgitation is trivial.  Aortic Valve: The aortic valve was not well  visualized. Aortic valve regurgitation is not visualized.  Pulmonic Valve: Pulmonic valve regurgitation is not visualized.  Aorta: The aortic root and ascending aorta are structurally normal, with no evidence of dilitation.  Venous: The inferior vena cava is normal in size with greater than 50% respiratory variability, suggesting right atrial pressure of 3 mmHg.  IAS/Shunts: No atrial level shunt detected by color flow Doppler.   LEFT VENTRICLE PLAX 2D LVIDd:         2.89 cm   Diastology LVIDs:         1.74 cm   LV e' medial:    9.50 cm/s LV PW:         1.20 cm   LV E/e' medial:  8.5 LV IVS:        1.24 cm   LV e' lateral:   8.49 cm/s LVOT diam:     2.10 cm   LV E/e' lateral: 9.6 LV SV:         45 LV SV Index:   23 LVOT Area:     3.46 cm   RIGHT VENTRICLE RV Basal diam:  3.02  cm RV Mid diam:    3.61 cm RV S prime:     7.29 cm/s TAPSE (M-mode): 1.0 cm  LEFT ATRIUM             Index        RIGHT ATRIUM          Index LA diam:        4.00 cm 2.06 cm/m   RA Area:     8.23 cm LA Vol (A2C):   39.3 ml 20.28 ml/m  RA Volume:   14.10 ml 7.28 ml/m LA Vol (A4C):   31.3 ml 16.15 ml/m LA Biplane Vol: 35.2 ml 18.17 ml/m AORTIC VALVE LVOT Vmax:   83.80 cm/s LVOT Vmean:  54.000 cm/s LVOT VTI:    0.131 m  AORTA Ao Root diam: 3.00 cm Ao Asc diam:  3.30 cm  MITRAL VALVE               TRICUSPID VALVE MV Area (PHT): 4.06 cm    TR Peak grad:   20.8 mmHg MV Decel Time: 187 msec    TR Vmax:        228.00 cm/s MV E velocity: 81.20 cm/s MV A velocity: 93.80 cm/s  SHUNTS MV E/A ratio:  0.87        Systemic VTI:  0.13 m Systemic Diam: 2.10 cm  Ronal Ross Electronically signed by Ronal Ross Signature Date/Time: 06/06/2022/2:35:55 PM    Final   MONITORS  LONG TERM MONITOR (3-14 DAYS) 12/14/2021  Narrative 14 Day Event Monitor  Quality: Fair.  Baseline artifact. Predominant rhythm: Sinus rhythm Average heart rate: 89 bpm Max heart rate: 140 bpm Min heart rate: 56 bpm Pauses >2.5 seconds: None  Rare PACs.  1.2% PVCs occasional ventricular couplets, ventricular bigeminy and ventricular trigeminy   Tiffany C. Raford, MD, [facility] 12/28/2021 6:02 PM  CT SCANS  CT CARDIAC SCORING (SELF PAY ONLY) 03/28/2023  Addendum 04/18/2023  6:00 PM ADDENDUM REPORT: 04/18/2023 17:58  EXAM: OVER-READ INTERPRETATION  CT CHEST  The following report is an over-read performed by radiologist Dr. Andrea Gasman of [facility] on 04/18/2023. This over-read does not include interpretation of cardiac or coronary anatomy or pathology. The coronary calcium  score interpretation by the cardiologist is attached.  COMPARISON:  Chest CT 03/07/2023  FINDINGS: Vascular: No aortic atherosclerosis. The included aorta is normal  in caliber.  Mediastinum/nodes: No  adenopathy or mass.  Tiny hiatal hernia.  Lungs: No focal airspace disease. Unchanged 3 mm perifissural lymph node in the right middle lobe, benign needing no further imaging follow-up. No pleural fluid. The included airways are patent.  Upper abdomen: No acute or unexpected findings.  Musculoskeletal: There are no acute or suspicious osseous abnormalities. Scattered degenerative spurring in the thoracic spine.  IMPRESSION: 1. No acute or unexpected findings. 2. Tiny hiatal hernia.   Electronically Signed By: Andrea Gasman M.D. On: 04/18/2023 17:58  Narrative CLINICAL DATA:  Cardiovascular Disease Risk stratification  EXAM: Coronary Calcium  Score  TECHNIQUE: A gated, non-contrast computed tomography scan of the heart was performed using 3 mm slice thickness. Axial images were analyzed on a dedicated workstation. Calcium  scoring of the coronary arteries was performed using the Agatston method.  FINDINGS: Coronary arteries: Normal origins.  Coronary Calcium  Score:  Left main: 0  Left anterior descending artery: 0  Left circumflex artery: 0  Right coronary artery: 0  Total: 0  Pericardium: Normal.  Aorta: Normal caliber.  Non-cardiac: See separate report from [facility].  IMPRESSION: Coronary calcium  score of 0.  This is a low risk study.  RECOMMENDATIONS: Coronary artery calcium  (CAC) score is a strong predictor of incident coronary heart disease (CHD) and provides predictive information beyond traditional risk factors. CAC scoring is reasonable to use in the decision to withhold, postpone, or initiate statin therapy in intermediate-risk or selected borderline-risk asymptomatic adults (age 68-75 years and LDL-C >=70 to <190 mg/dL) who do not have diabetes or established atherosclerotic cardiovascular disease (ASCVD).* In intermediate-risk (10-year ASCVD risk >=7.5% to <20%) adults or selected borderline-risk (10-year ASCVD risk >=5% to  <7.5%) adults in whom a CAC score is measured for the purpose of making a treatment decision the following recommendations have been made:  If CAC=0, it is reasonable to withhold statin therapy and reassess in 5 to 10 years, as long as higher risk conditions are absent (diabetes mellitus, family history of premature CHD in first degree relatives (males <55 years; females <65 years), cigarette smoking, or LDL >=190 mg/dL).  If CAC is 1 to 99, it is reasonable to initiate statin therapy for patients >=45 years of age.  If CAC is >=100 or >=75th percentile, it is reasonable to initiate statin therapy at any age.  Cardiology referral should be considered for patients with CAC scores >=400 or >=75th percentile.  *2018 AHA/ACC/AACVPR/AAPA/ABC/ACPM/ADA/AGS/APhA/ASPC/NLA/PCNA Guideline on the Management of Blood Cholesterol: A Report of the American College of Cardiology/American Heart Association Task Force on Clinical Practice Guidelines. J Am Coll Cardiol. 2019;73(24):3168-3209.  Vinie Maxcy, MD  Electronically Signed: By: Vinie JAYSON Maxcy M.D. On: 03/28/2023 20:51   CT SCANS  CT CORONARY MORPH W/CTA COR W/SCORE 04/11/2020  Addendum 04/11/2020  4:57 PM ADDENDUM REPORT: 04/11/2020 16:55  CLINICAL DATA:  58 Year old African American female  EXAM: Cardiac/Coronary  CTA  TECHNIQUE: The patient was scanned on a [employer].  FINDINGS: A 100 kV prospective scan was triggered in the descending thoracic aorta at 111 HU's. Axial non-contrast 3 mm slices were carried out through the heart. The data set was analyzed on a dedicated work station and scored using the Agatson method. Gantry rotation speed was 250 msecs and collimation was .6 mm. No beta blockade and 0.8 mg of sl NTG was given. The 3D data set was reconstructed in 5% intervals of the 67-82 % of the R-R cycle. Diastolic phases were analyzed on a  dedicated work station using MPR, MIP and VRT modes. The  patient received 80 cc of contrast.  Aorta:  Normal size.  No calcifications.  No dissection.  Aortic Valve:  Tri-leaflet.  No calcifications.  Coronary Arteries:  Normal coronary origin.  Right dominance.  Coronary calcium  score of 0. This was 1st percentile for age, sex, and race matched control.  RCA is a large dominant artery that gives rise to PDA and PLA. There is no plaque.  Left main is a large artery that gives rise to LAD and LCX arteries.  LAD is a large vessel that gives rise to one large D1 branch with a minimal non-obstructive (1-24%) soft plaque at the take-off of the D1 vessel.  LCX is a non-dominant artery that gives rise to one large OM1 branch. There is a minimal non-obstructive narrowing (1-24%) in the mid vessel.  Other findings:  Normal pulmonary vein drainage into the left atrium.  Normal left atrial appendage without a thrombus.  Normal size of the pulmonary artery.  Extra-cardiac findings: See attached radiology report for non-cardiac structures.  IMPRESSION: 1. Coronary calcium  score of 0. This was 1st percentile for age, sex, and race matched control.  2. Normal coronary origin with right dominance.  3. CAD-RADS 1. 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 Consider non-atherosclerotic causes of chest pain. Consider preventive therapy and risk factor modification.  Stanly Leavens MD   Electronically Signed By: Stanly Leavens MD On: 04/11/2020 16:55  Narrative EXAM: OVER-READ INTERPRETATION  CT CHEST  The following report is an over-read performed by radiologist Dr. Toribio Aye of [facility] on 04/11/2020. This over-read does not include interpretation of cardiac or coronary anatomy or pathology. The coronary calcium  score/coronary CTA interpretation by the cardiologist is attached.  COMPARISON:  None.  FINDINGS: Within the visualized portions of the thorax there are no suspicious appearing pulmonary  nodules or masses, there is no acute consolidative airspace disease, no pleural effusions, no pneumothorax and no lymphadenopathy. Visualized portions of the upper abdomen are unremarkable. There are no aggressive appearing lytic or blastic lesions noted in the visualized portions of the skeleton.  IMPRESSION: No significant incidental noncardiac findings are noted.  Electronically Signed: By: Toribio Aye M.D. On: 04/11/2020 16:08   PYP SCAN  MYOCARDIAL AMYLOID PLANAR AND SPECT 03/05/2022  Narrative   By semi-quantitative assessment scan is consistent with increased heart uptake but less than rib uptake-Grade 1. Heart to contralateral lung ratio is between 1-1.5, indeterminate for amyloid.   Study is equivocal for TTR amyloidosis (visual score of 1/ratio between 1-1.5).   Prior study not available for comparison.  Conclusion  Equivocal for TTR amyloidosis (Equivocal: A semi-quantitative visual score of 1 or H/CL ratio 1-1.5)  **If echo/CMR are strongly positive, and negative, consider further evaluation including endomyocardial biopsy  (Note: A negative or mildly positive PYP does not exclude AL amyloid. In addition, equivocal results could represent AL amyloid or early TTR amyloid)  31mTechnetium-Pyrophosphate Imaging  for Transthyretin Cardiac Amyloidosis - [facility] Practice Points, CANDIE Blackbird, et. Al.        Risk Assessment/Calculations:         STOP-Bang Score:         Physical Exam:   VS:  BP 112/64   Pulse 85   Ht 5' 3 (1.6 m)   Wt 199 lb 12.8 oz (90.6 kg)   LMP 02/21/2013   SpO2 95%   BMI 35.39 kg/m    Wt Readings from Last 3 Encounters:  06/07/23 199 lb 12.8 oz (90.6 kg)  03/07/23 199 lb (90.3 kg)  02/07/23 201 lb 12.8 oz (91.5 kg)    GEN: Well nourished, well developed in no acute distress NECK: No JVD; No carotid bruits CARDIAC: RRR, no murmurs, rubs, gallops RESPIRATORY:  Clear to auscultation without rales, wheezing or rhonchi  ABDOMEN:  Soft, non-tender, non-distended EXTREMITIES:  No edema; No deformity   ASSESSMENT AND PLAN: .    HTN -  BP well controlled. Continue current antihypertensive regimen.    Aortic atherosclerosis / HLD, LDL goal <70 - chest pain atypical for angina, as below. Calcium  score 03/2023 0. Continue Atorvastatin  20mg  daily. Did not tolerate higher doses.   Chest pain / GERD - ED visit 12/4 with atypical right sided chest pain relieved by Omeprazole . Likely etiology GERD. Coronary calcium  score 03/2023 of 0. Recommend follow up with GI as scheduled. Due to more recent reflux, stop Omeprazole  and start Pantoprazole  40mg  BID. Handouts on GERD diet provided.  OSA - Nonadherent to CPAP as reports not tolerating with present respiratory cold symptoms and concerns about cleanliness of CPAP. Has not established good relationship with sleep medicine within cardiology, requests referral to pulmonology for further management of OSA which we will place today. Previously referred to ENT to discuss Inspire and while she did see multiple ENT providers does not recall discussing Inspire.        Dispo: follow up with Dr. Raford 10/2023  Signed, Reche GORMAN Finder, NP

## 2023-07-16 ENCOUNTER — Encounter (HOSPITAL_BASED_OUTPATIENT_CLINIC_OR_DEPARTMENT_OTHER): Payer: Self-pay

## 2023-07-22 ENCOUNTER — Encounter: Payer: Self-pay | Admitting: Oncology

## 2023-07-22 ENCOUNTER — Other Ambulatory Visit: Payer: Self-pay | Admitting: Family Medicine

## 2023-07-22 DIAGNOSIS — Z0000 Encounter for general adult medical examination without abnormal findings: Secondary | ICD-10-CM

## 2023-07-23 ENCOUNTER — Encounter: Payer: Self-pay | Admitting: Oncology

## 2023-07-24 ENCOUNTER — Ambulatory Visit: Payer: 59 | Admitting: Gastroenterology

## 2023-08-07 ENCOUNTER — Other Ambulatory Visit: Payer: Self-pay | Admitting: Family Medicine

## 2023-08-09 ENCOUNTER — Telehealth: Payer: Self-pay | Admitting: Family Medicine

## 2023-08-09 MED ORDER — EPINEPHRINE 0.3 MG/0.3ML IJ SOAJ: 0.3000 mg | INTRAMUSCULAR | 0 refills | Status: AC | PRN

## 2023-08-09 MED ORDER — EPIPEN 2-PAK 0.3 MG/0.3ML IJ SOAJ: 0.3000 mg | INTRAMUSCULAR | 0 refills | Status: DC | PRN

## 2023-08-09 NOTE — Telephone Encounter (Signed)
 Resent as generic.

## 2023-08-09 NOTE — Telephone Encounter (Signed)
 duplicate

## 2023-08-09 NOTE — Telephone Encounter (Signed)
 Copied from CRM [id number]. Topic: Clinical - Prescription Issue >> Aug 09, 2023  4:31 PM Denese Killings wrote: Reason for CRM: Trey Paula with [facility] has question regarding EPIPEN 2-PAK 0.3 MG/0.3ML SOAJ injection that was sent over. He states that the brand DAW1 is not covered under insurance. He wants to know if it has to be a brand or can patient get a generic. If patient can have a generic please change prescription to generic or call so they can fill it.

## 2023-08-09 NOTE — Telephone Encounter (Signed)
 Routing to office for follow up  Copied from CRM [id number]. Topic: Clinical - Prescription Issue >> Aug 09, 2023  4:31 PM Denese Killings wrote: Reason for CRM: Trey Paula with [facility] has question regarding EPIPEN 2-PAK 0.3 MG/0.3ML SOAJ injection that was sent over. He states that the brand DAW1 is not covered under insurance. He wants to know if it has to be a brand or can patient get a generic. If patient can have a generic please change prescription to generic or call so they can fill it.

## 2023-08-27 ENCOUNTER — Ambulatory Visit (HOSPITAL_BASED_OUTPATIENT_CLINIC_OR_DEPARTMENT_OTHER): Payer: 59 | Admitting: Primary Care

## 2023-08-27 ENCOUNTER — Encounter (HOSPITAL_BASED_OUTPATIENT_CLINIC_OR_DEPARTMENT_OTHER): Payer: Self-pay | Admitting: Primary Care

## 2023-08-27 VITALS — BP 118/74 | HR 104 | Ht 63.0 in | Wt 199.2 lb

## 2023-08-27 DIAGNOSIS — R490 Dysphonia: Secondary | ICD-10-CM

## 2023-08-27 DIAGNOSIS — R0602 Shortness of breath: Secondary | ICD-10-CM

## 2023-08-27 DIAGNOSIS — K219 Gastro-esophageal reflux disease without esophagitis: Secondary | ICD-10-CM | POA: Diagnosis not present

## 2023-08-27 DIAGNOSIS — G4733 Obstructive sleep apnea (adult) (pediatric): Secondary | ICD-10-CM

## 2023-08-27 NOTE — Patient Instructions (Addendum)
 -  OBSTRUCTIVE SLEEP APNEA: Obstructive sleep apnea is a condition where your airway becomes blocked during sleep, causing breathing pauses. We adjusted your CPAP settings to auto (5-10 cm H2O) and increased the humidification level. We also recommended replacing your CPAP supplies and encouraged consistent use. If CPAP is not tolerated, we will discuss the Inspire device with an ENT specialist.  -CHRONIC SINUSITIS: Chronic sinusitis is long-term inflammation of the sinuses, which can cause congestion and other symptoms. We referred you to an ENT specialist, Dr. Burnadette Peter, to evaluate your chronic sinusitis and vocal hoarseness. We also adjusted your CPAP humidification to help with nasal dryness and nosebleeds. Ok to take either over the counter loratadine (Claritin) or cetirizine (zyretc). Do not take pseudoephedrine, sudafed   -VOCAL CORD PARALYSIS: Vocal cord paralysis is when one or both vocal cords do not move properly, affecting your voice. We referred you to Dr. Irene Pap with [facility] for further evaluation and possible laryngoscopy. We may also consider speech therapy for voice and breathing exercises.  -ASTHMA: Asthma is a condition where your airways narrow and swell, causing breathing difficulties. We ordered pulmonary function tests to check for obstructive lung disease and will assess the need for a maintenance inhaler based on the results.  -GASTROESOPHAGEAL REFLUX DISEASE (GERD): GERD is a condition where stomach acid frequently flows back into the tube connecting your mouth and stomach, causing irritation. We advised you to continue taking pantoprazole 40 mg twice daily.  INSTRUCTIONS:  Please follow up with the ENT specialist, Dr. Deboraha Sprang, for your chronic sinusitis and vocal cord paralysis evaluations. Additionally, complete the pulmonary function tests as ordered. Continue using your CPAP machine consistently and replace the supplies as recommended. If you experience any issues or  have concerns, please contact our office.  Follow-up 3-4 months with Dr. Vassie Loll or APP

## 2023-08-27 NOTE — Progress Notes (Signed)
 @Patient  ID: Marilyn [name], female    DOB: 24-Jan-1966, 58 y.o.   MRN: [id number]  Chief Complaint  Patient presents with   Establish Care    Obstructive sleep apnea, pt has machine and uses [employer]    Referring provider: Alver Sorrow, NP  HPI: 58 year old female, former smoker quit in 1982. PMH significant for aortic atherosclerosis, diabetes, HTN, asthma, allergic rhinitis, GERD, hyperthyroidism, obesity.    08/27/2023 Discussed the use of AI scribe software for clinical note transcription with the patient, who gave verbal consent to proceed.  History of Present Illness   Marilyn [name] is a 58 year old female with sleep apnea who presents for a sleep consult.  She has a history of sleep apnea diagnosed in July 2023 following a sleep study and CPAP titration study. She was prescribed a CPAP machine, which she uses intermittently due to concerns about a recall she read about online, although her machine, a ResMed, was not affected. She experiences sinus infections, epistaxis, and fluid in her ears, which she associates with CPAP use. She also reports dizziness, swelling on the left side of her face, and chronic congestion. She describes a sensation of choking and difficulty breathing, particularly when using the CPAP for extended periods.  She has a history of thyroid removal at age 51 and reports changes in her voice, which her son has also noticed. She experiences shortness of breath when talking and has a history of asthma, for which she uses albuterol as needed, approximately once or twice a year. She has not undergone a full breathing test. She has a history of vocal cord issues, with a past diagnosis of one-sided vocal cord paralysis. She has seen a speech therapist for evaluation but not for therapy. She reports hearing difficulties and pressure in her ears, which affects her hearing.  She takes pantoprazole 40 mg twice daily for reflux, which was recently changed  due to stomach soreness. She experiences occasional stomach pain, which she associates with certain foods.  She reports waking up two to three times a night to urinate and occasional insomnia due to stress. She feels she sleeps well unless stressed and tries to maintain a regular sleep schedule.       Allergies  Allergen Reactions   Promethazine-Dm    [place] Gum [Fish Allergy] Anaphylaxis   Codeine Nausea And Vomiting   Fluticasone Propionate Other (See Comments)   Promethazine Other (See Comments)   Atorvastatin     Muscle aches    Flonase [Fluticasone Propionate] Other (See Comments)    Nose bleeds    Immunization History  Administered Date(s) Administered   Influenza Split 04/30/2011   Influenza Whole 03/04/2012   Influenza,inj,Quad PF,6+ Mos 04/01/2014, 07/20/2015, 02/20/2017   Influenza-Unspecified 02/02/2014   PPD Test 03/25/2017   Pneumococcal Polysaccharide-23 04/01/2014   Td 06/04/1997, 12/22/2009    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   Neck pain 09/09/2011  Obesity 04/11/2014   Pericardial effusion 02/07/2022   Sleep apnea     Tobacco History: Social History   Tobacco Use  Smoking Status Former   Current packs/day: 0.00   Average packs/day: 0.3 packs/day for 3.0 years (0.8 ttl pk-yrs)   Types: Cigarettes   Start date: 06/04/1977   Quit date: 06/04/1980   Years since quitting: 43.2  Smokeless Tobacco Never   Counseling given: Not Answered   Outpatient Medications Prior to Visit  Medication  Sig Dispense Refill   acetaminophen (TYLENOL) 500 MG tablet Take 1 tablet (500 mg total) by mouth every 6 (six) hours as needed. 20 tablet 0   albuterol (VENTOLIN HFA) 108 (90 Base) MCG/ACT inhaler Inhale 1-2 puffs into the lungs every 6 (six) hours as needed for wheezing or shortness of breath. 8.5 g 0   amLODipine (NORVASC) 10 MG tablet Take 1 tablet (10 mg total) by mouth daily. 90 tablet 0   aspirin EC 81 MG tablet Take 1 tablet (81 mg total) by mouth daily. Swallow whole. 90 tablet 3   atorvastatin (LIPITOR) 20 MG tablet Take 20 mg by mouth daily.     Beclomethasone Dipropionate 40 MCG/ACT AERS Place 1 spray into the nose in the morning and at bedtime. 6.8 g 1   Blood Glucose Monitoring Suppl (ONETOUCH VERIO FLEX SYSTEM) w/Device KIT Use to check blood glucose TID or prn 1 kit 0   Blood Pressure Monitoring (COMFORT TOUCH BP CUFF/LARGE) MISC Please measure blood pressure 1 time per day 1-2 hours after taking medication and resting for 5-10 minutes, please keep a log and bring to all future appointments. DX I10 1 each 0   cetirizine (ZYRTEC) 10 MG tablet Take 1 tablet (10 mg total) by mouth daily. 5 tablet 0   Cholecalciferol (VITAMIN D) 125 MCG (5000 UT) CAPS Take 1 tablet by mouth daily.     cyclobenzaprine (FLEXERIL) 5 MG tablet Take 1 tablet (5 mg total) by mouth 3 (three) times daily as needed. 90 tablet 1   EPINEPHrine (EPIPEN 2-PAK) 0.3 mg/0.3 mL IJ SOAJ injection Inject 0.3 mg into the muscle as needed for anaphylaxis. 1 each 0   ibuprofen (ADVIL) 600 MG tablet Take 1 tablet (600 mg total) by mouth every 6 (six) hours as needed. 12 tablet 0   Lancets (ONETOUCH DELICA PLUS LANCET30G) MISC USE LANCETS THREE TIMES A DAY IN CONJUNCTION WITH BLOOD GLUCOSE MONITORING 100 each 2   levothyroxine (SYNTHROID) 100 MCG tablet TAKE 1 TABLET BY MOUTH DAILY 90 tablet 0   metoprolol succinate (TOPROL-XL) 25 MG 24 hr tablet Take 1 tablet (25 mg total) by mouth daily. 90 tablet 3   naproxen (NAPROSYN) 500  MG tablet Take 1 tablet (500 mg total) by mouth 2 (two) times daily. 30 tablet 0   ondansetron (ZOFRAN) 4 MG tablet Take 1 tablet (4 mg total) by mouth every 8 (eight) hours as needed for nausea or vomiting. 10 tablet 0   ondansetron (ZOFRAN-ODT) 4 MG disintegrating tablet Take 1 tablet (4 mg total) by mouth every 8 (eight) hours as needed for nausea or vomiting. 8 tablet 0   ONETOUCH VERIO test strip TEST AS DIRECTED THREE TIMES A DAY 100 strip 2   pantoprazole (PROTONIX) 40 MG tablet Take 1 tablet (40 mg total) by mouth 2 (two) times daily. 60 tablet 2   vitamin B-12 (CYANOCOBALAMIN) 500 MCG tablet Take 500 mcg by mouth daily.     No facility-administered medications prior to visit.   Review  of Systems  Review of Systems  Constitutional: Negative.   HENT:  Positive for congestion, ear pain, postnasal drip and voice change.   Respiratory:  Positive for shortness of breath.   Cardiovascular: Negative.    Physical Exam  BP 118/74   Pulse (!) 104   Ht 5\' 3"  (1.6 m)   Wt 199 lb 3.2 oz (90.4 kg)   LMP 02/21/2013   SpO2 96%   BMI 35.29 kg/m  Physical Exam Constitutional:      Appearance: Normal appearance. She is not ill-appearing.     Comments: Vocal dystonia   HENT:     Head: Normocephalic and atraumatic.     Right Ear: Tympanic membrane and external ear normal. There is no impacted cerumen.     Left Ear: Tympanic membrane and external ear normal. There is no impacted cerumen.  Cardiovascular:     Rate and Rhythm: Normal rate and regular rhythm.  Pulmonary:     Effort: Pulmonary effort is normal.     Breath sounds: Normal breath sounds. No wheezing.  Musculoskeletal:        General: Normal range of motion.  Skin:    General: Skin is warm and dry.  Neurological:     General: No focal deficit present.     Mental Status: She is alert and oriented to person, place, and time. Mental status is at baseline.  Psychiatric:        Mood and Affect: Mood normal.        Behavior:  Behavior normal.        Thought Content: Thought content normal.        Judgment: Judgment normal.      Lab Results:  CBC    Component Value Date/Time   WBC 4.4 05/09/2023 1119   RBC 4.37 05/09/2023 1119   HGB 11.4 (L) 05/09/2023 1119   HGB 12.3 11/17/2021 1532   HGB 13.0 12/30/2015 1434   HCT 36.1 05/09/2023 1119   HCT 37.5 11/17/2021 1532   HCT 37.9 12/30/2015 1434   PLT 229 05/09/2023 1119   PLT 231 11/17/2021 1532   MCV 82.6 05/09/2023 1119   MCV 83 11/17/2021 1532   MCV 80.6 12/30/2015 1434   MCH 26.1 05/09/2023 1119   MCHC 31.6 05/09/2023 1119   RDW 13.7 05/09/2023 1119   RDW 13.3 11/17/2021 1532   RDW 13.1 12/30/2015 1434   LYMPHSABS 1.3 06/07/2022 1406   LYMPHSABS 2.5 12/30/2015 1434   MONOABS 0.4 06/07/2022 1406   MONOABS 0.3 12/30/2015 1434   EOSABS 0.0 06/07/2022 1406   EOSABS 0.1 12/30/2015 1434   BASOSABS 0.0 06/07/2022 1406   BASOSABS 0.0 12/30/2015 1434    BMET    Component Value Date/Time   NA 140 05/09/2023 1119   NA 141 08/03/2022 1302   NA 140 06/03/2015 1416   K 3.6 05/09/2023 1119   K 4.1 06/03/2015 1416   CL 108 05/09/2023 1119   CL 104 08/12/2012 1329   CO2 25 05/09/2023 1119   CO2 27 06/03/2015 1416   GLUCOSE 101 (H) 05/09/2023 1119   GLUCOSE 86 06/03/2015 1416   GLUCOSE 95 08/12/2012 1329   BUN 15 05/09/2023 1119   BUN 10 08/03/2022 1302   BUN 12.6 06/03/2015 1416   CREATININE 0.82 05/09/2023 1119   CREATININE 0.80 03/04/2020 0200   CREATININE 0.9 06/03/2015 1416   CALCIUM 8.4 (L) 05/09/2023 1119   CALCIUM 9.2 06/03/2015 1416   GFRNONAA >60 05/09/2023 1119   GFRNONAA 82  03/04/2020 0200   GFRAA 96 03/04/2020 0200    BNP    Component Value Date/Time   BNP 4.7 01/31/2022 0612   BNP <4 03/04/2020 0200    ProBNP No results found for: "PROBNP"  Imaging: No results found.   Assessment & Plan:   No problem-specific Assessment & Plan notes found for this encounter.  Assessment and Plan    Obstructive Sleep  Apnea Sleep study in July 2024 showed moderate to severe obstructive sleep apnea with 28.5 apneic events per hour and oxygen desaturation to 75%. Stressed the importance of CPAP therapy to reduce cardiovascular and pulmonary risks. No recalls for ResMed machines. Inspire device is an alternative if CPAP is not tolerated. - Adjust CPAP pressure settings to auto (5-10 cm H2O). - Increase humidification level on CPAP. - Replace CPAP supplies including tubing, mask, and filter. - Encourage consistent CPAP use nightly - Discuss potential Inspire device with ENT if CPAP is not tolerated.  Chronic Sinusitis Chronic sinus congestion with possible laryngeal pharyngeal reflux. ENT evaluation needed to assess symptoms  - Refer to ENT (Dr. Irene Pap) for evaluation of chronic sinusitis and vocal hoarseness. - Adjust CPAP humidification to alleviate nasal dryness and epistaxis.  Vocal Cord Paralysis One-sided vocal cord paralysis with voice changes and hoarseness. ENT evaluation required for extent and intervention. - Refer to ENT (Dr. Irene Pap) for further evaluation and possible laryngoscopy. - Consider referral to speech therapy for voice and breathing exercises.  Asthma Intermittent asthma with rare albuterol use. No maintenance inhaler. Pulmonary function testing needed to evaluate for obstructive lung disease. - Order pulmonary function tests to evaluate for obstructive lung disease. - Assess need for maintenance inhaler based on test results.  Gastroesophageal Reflux Disease (GERD) GERD managed with pantoprazole 40 mg twice daily. Possible contribution to laryngeal symptoms. - Continue pantoprazole 40 mg twice daily.      Glenford Bayley, NP 08/27/2023

## 2023-08-27 NOTE — Progress Notes (Signed)

## 2023-08-28 ENCOUNTER — Encounter (INDEPENDENT_AMBULATORY_CARE_PROVIDER_SITE_OTHER): Payer: Self-pay

## 2023-08-28 ENCOUNTER — Telehealth (INDEPENDENT_AMBULATORY_CARE_PROVIDER_SITE_OTHER): Payer: Self-pay | Admitting: Otolaryngology

## 2023-08-28 NOTE — Telephone Encounter (Signed)
 Called patient to schedule an appointment.  I was unable to LVM because the mailbox was full and could not accept messages.  Sent a Radio broadcast assistant requesting patient call our office.

## 2023-09-05 ENCOUNTER — Encounter: Payer: Self-pay | Admitting: Family

## 2023-09-05 ENCOUNTER — Ambulatory Visit: Admission: RE | Admit: 2023-09-05 | Payer: 59 | Source: Ambulatory Visit

## 2023-09-05 ENCOUNTER — Ambulatory Visit (INDEPENDENT_AMBULATORY_CARE_PROVIDER_SITE_OTHER): Admitting: Family

## 2023-09-05 ENCOUNTER — Ambulatory Visit: Admitting: Family

## 2023-09-05 ENCOUNTER — Ambulatory Visit: Admitting: Family Medicine

## 2023-09-05 VITALS — BP 123/85 | HR 93 | Temp 98.1°F | Ht 63.0 in | Wt 199.2 lb

## 2023-09-05 DIAGNOSIS — J4521 Mild intermittent asthma with (acute) exacerbation: Secondary | ICD-10-CM | POA: Diagnosis not present

## 2023-09-05 DIAGNOSIS — J028 Acute pharyngitis due to other specified organisms: Secondary | ICD-10-CM | POA: Diagnosis not present

## 2023-09-05 DIAGNOSIS — H1031 Unspecified acute conjunctivitis, right eye: Secondary | ICD-10-CM | POA: Diagnosis not present

## 2023-09-05 LAB — POCT RAPID STREP A (OFFICE): Rapid Strep A Screen: NEGATIVE

## 2023-09-05 MED ORDER — POLYMYXIN B-TRIMETHOPRIM 10000-0.1 UNIT/ML-% OP SOLN: 1.0000 [drp] | OPHTHALMIC | 0 refills | Status: AC

## 2023-09-05 MED ORDER — NAPROXEN 500 MG PO TABS: 500.0000 mg | ORAL_TABLET | Freq: Two times a day (BID) | ORAL | 0 refills | Status: DC

## 2023-09-05 MED ORDER — BUDESONIDE-FORMOTEROL FUMARATE 80-4.5 MCG/ACT IN AERO: 1.0000 | INHALATION_SPRAY | Freq: Two times a day (BID) | RESPIRATORY_TRACT | 3 refills | Status: AC

## 2023-09-05 NOTE — Progress Notes (Signed)
 Patient ID: Marilyn [name], female    DOB: [birth year], 58 y.o.   MRN: [id number]  Chief Complaint  Patient presents with   Sore Throat    Pt c/o Sore throat and Right eye pain, itching and redness. Present for 2 days. Has tried Mucinex which did help sx.   Discussed the use of AI scribe software for clinical note transcription with the patient, who gave verbal consent to proceed.  History of Present Illness The patient, with a history of asthma, presents with right eye pain, redness, and discharge that started the previous day. The patient initially attributed these symptoms to allergies due to recent exposure to pollen. However, the unilateral nature of the symptoms and the development of a greenish discharge raised concern. The patient attempted to manage the eye symptoms with over-the-counter Visine drops but noted increased redness after application. The patient also reports itchiness in the affected eye. In addition to the eye symptoms, the patient has been experiencing a sore throat and has been taking over-the-counter Mucinex for relief. The patient describes the throat as red and painful, particularly when swallowing. The patient has not been diagnosed with strep throat. The patient also has a long-standing fluid-filled lesion near the eye. The lesion has been evaluated by multiple doctors, including an eye doctor and a dermatologist, without a definitive diagnosis or treatment plan. The patient expresses a desire to have the lesion removed but has concerns about the procedure. The patient also mentions a history of asthma and is scheduled to see a pulmonary specialist later in the month. The patient reports occasional shortness of breath and wheezing, which have been increasing over the past year.  Assessment & Plan Conjunctivitis - Acute right eye pain with redness, itching, and greenish discharge, likely bacterial. Myosin drops may have worsened redness. Expected improvement with  antibiotics. - Prescribe Polymyxin eye drops, one drop four times a day for five to seven days. - Discontinue Visine and use prescribed drops instead. - Inform that improvement should be seen in two days, but continue treatment for five to seven days to ensure resolution. - She can return to work after 24 hours of antibiotic use. - Call office if no improvement after 5 days.  Asthma - Asthma with shortness of breath and wheezing, possibly exacerbated by allergy season. Discussed Symbicort for better control due to longer-acting properties. - Prescribe generic low dose Symbicort, one or two puffs twice a day, to be used for the next week and assess effectiveness. - Continue using albuterol HHI as needed. - Follow up with a pulmonary specialist for further evaluation.  Allergic Rhinitis - Nasal congestion, drainage, and hoarseness likely due to seasonal allergies. Discussed Flonase or Nasacort with caution for epistaxis. - Use Flonase or Nasacort as effective allergy treatments, with caution regarding potential epistaxis. - Consider using nasal sprays every other day to minimize side effects.  Viral Pharyngitis - Sore throat with negative strep test, likely viral. Discussed naproxen for pain and inflammation. - Prescribe naproxen for pain and inflammation, one pill twice a day for a couple of days. - Continue Mucinex if it is providing relief.   Assessment & Plan:   Subjective:    Outpatient Medications Prior to Visit  Medication Sig Dispense Refill   acetaminophen (TYLENOL) 500 MG tablet Take 1 tablet (500 mg total) by mouth every 6 (six) hours as needed. 20 tablet 0   albuterol (VENTOLIN HFA) 108 (90 Base) MCG/ACT inhaler Inhale 1-2 puffs into the lungs every 6 (  six) hours as needed for wheezing or shortness of breath. 8.5 g 0   amLODipine (NORVASC) 10 MG tablet Take 1 tablet (10 mg total) by mouth daily. 90 tablet 0   aspirin EC 81 MG tablet Take 1 tablet (81 mg total) by mouth  daily. Swallow whole. 90 tablet 3   atorvastatin (LIPITOR) 20 MG tablet Take 20 mg by mouth daily.     Beclomethasone Dipropionate 40 MCG/ACT AERS Place 1 spray into the nose in the morning and at bedtime. 6.8 g 1   Blood Glucose Monitoring Suppl (ONETOUCH VERIO FLEX SYSTEM) w/Device KIT Use to check blood glucose TID or prn 1 kit 0   Blood Pressure Monitoring (COMFORT TOUCH BP CUFF/LARGE) MISC Please measure blood pressure 1 time per day 1-2 hours after taking medication and resting for 5-10 minutes, please keep a log and bring to all future appointments. DX I10 1 each 0   cetirizine (ZYRTEC) 10 MG tablet Take 1 tablet (10 mg total) by mouth daily. 5 tablet 0   Cholecalciferol (VITAMIN D) 125 MCG (5000 UT) CAPS Take 1 tablet by mouth daily.     cyclobenzaprine (FLEXERIL) 5 MG tablet Take 1 tablet (5 mg total) by mouth 3 (three) times daily as needed. 90 tablet 1   EPINEPHrine (EPIPEN 2-PAK) 0.3 mg/0.3 mL IJ SOAJ injection Inject 0.3 mg into the muscle as needed for anaphylaxis. 1 each 0   ibuprofen (ADVIL) 600 MG tablet Take 1 tablet (600 mg total) by mouth every 6 (six) hours as needed. 12 tablet 0   Lancets (ONETOUCH DELICA PLUS LANCET30G) MISC USE LANCETS THREE TIMES A DAY IN CONJUNCTION WITH BLOOD GLUCOSE MONITORING 100 each 2   levothyroxine (SYNTHROID) 100 MCG tablet TAKE 1 TABLET BY MOUTH DAILY 90 tablet 0   metoprolol succinate (TOPROL-XL) 25 MG 24 hr tablet Take 1 tablet (25 mg total) by mouth daily. 90 tablet 3   naproxen (NAPROSYN) 500 MG tablet Take 1 tablet (500 mg total) by mouth 2 (two) times daily. 30 tablet 0   ondansetron (ZOFRAN) 4 MG tablet Take 1 tablet (4 mg total) by mouth every 8 (eight) hours as needed for nausea or vomiting. 10 tablet 0   ondansetron (ZOFRAN-ODT) 4 MG disintegrating tablet Take 1 tablet (4 mg total) by mouth every 8 (eight) hours as needed for nausea or vomiting. 8 tablet 0   ONETOUCH VERIO test strip TEST AS DIRECTED THREE TIMES A DAY 100 strip 2    pantoprazole (PROTONIX) 40 MG tablet Take 1 tablet (40 mg total) by mouth 2 (two) times daily. 60 tablet 2   vitamin B-12 (CYANOCOBALAMIN) 500 MCG tablet Take 500 mcg by mouth daily.     No facility-administered medications prior to visit.   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   Neck pain 09/09/2011   Obesity 04/11/2014   Pericardial effusion 02/07/2022   Sleep apnea    Past Surgical History:  Procedure Laterality Date  BREAST BIOPSY Right 12/2014   BREAST EXCISIONAL BIOPSY Right 2016   BREAST LUMPECTOMY WITH RADIOACTIVE SEED LOCALIZATION Right 02/25/2015   Procedure: BREAST LUMPECTOMY WITH RADIOACTIVE SEED LOCALIZATION;  Surgeon: Ovidio Kin, MD;  Location: [facility];  Service: General;  Laterality: Right;   BREAST REDUCTION SURGERY  1986   BREAST SURGERY     broken jaw     CARPAL TUNNEL RELEASE  2001   COLONOSCOPY     FRACTURE SURGERY     REDUCTION MAMMAPLASTY Bilateral    THYROIDECTOMY     TUBAL LIGATION  1987   UPPER GASTROINTESTINAL ENDOSCOPY     Allergies  Allergen Reactions   Promethazine-Dm    [place] Gum [Fish Allergy] Anaphylaxis   Codeine Nausea And Vomiting   Fluticasone Propionate Other (See Comments)   Promethazine Other (See Comments)   Atorvastatin     Muscle aches    Flonase [Fluticasone Propionate] Other (See Comments)    Nose bleeds      Objective:    Physical Exam Vitals and nursing note reviewed.  Constitutional:      Appearance: Normal appearance.  HENT:     Nose:  Congestion and rhinorrhea present. Rhinorrhea is clear.     Right Sinus: No frontal sinus tenderness.     Left Sinus: No frontal sinus tenderness.     Mouth/Throat:     Mouth: Mucous membranes are moist.     Pharynx: Posterior oropharyngeal erythema and postnasal drip present. No pharyngeal swelling, oropharyngeal exudate or uvula swelling.     Tonsils: No tonsillar exudate or tonsillar abscesses.  Eyes:     Extraocular Movements: Extraocular movements intact.     Conjunctiva/sclera:     Right eye: Right conjunctiva is injected (& sclera). Exudate present.  Cardiovascular:     Rate and Rhythm: Normal rate and regular rhythm.  Pulmonary:     Effort: Pulmonary effort is normal.     Breath sounds: Normal breath sounds.  Musculoskeletal:        General: Normal range of motion.  Lymphadenopathy:     Head:     Right side of head: No submandibular, tonsillar, preauricular or posterior auricular adenopathy.     Left side of head: No submandibular, tonsillar, preauricular or posterior auricular adenopathy.     Cervical: Cervical adenopathy present.     Right cervical: Superficial cervical adenopathy (mild) present.     Left cervical: Superficial cervical adenopathy (mild) present.  Skin:    General: Skin is warm and dry.  Neurological:     Mental Status: She is alert.  Psychiatric:        Mood and Affect: Mood normal.        Behavior: Behavior normal.    BP 123/85 (BP Location: Left Arm, Patient Position: Sitting, Cuff Size: Large)   Pulse 93   Temp 98.1 F (36.7 C) (Temporal)   Ht 5\' 3"  (1.6 m)   Wt 199 lb 3.2 oz (90.4 kg)   LMP 02/21/2013   SpO2 96%   BMI 35.29 kg/m  Wt Readings from Last 3 Encounters:  09/05/23 199 lb 3.2 oz (90.4 kg)  08/27/23 199 lb 3.2 oz (90.4 kg)  06/07/23 199 lb 12.8 oz (90.6 kg)       Dulce Sellar, NP

## 2023-09-10 ENCOUNTER — Encounter (HOSPITAL_BASED_OUTPATIENT_CLINIC_OR_DEPARTMENT_OTHER): Payer: Self-pay

## 2023-09-10 ENCOUNTER — Other Ambulatory Visit: Payer: Self-pay

## 2023-09-10 ENCOUNTER — Emergency Department (HOSPITAL_BASED_OUTPATIENT_CLINIC_OR_DEPARTMENT_OTHER): Admitting: Radiology

## 2023-09-10 DIAGNOSIS — J029 Acute pharyngitis, unspecified: Secondary | ICD-10-CM | POA: Diagnosis present

## 2023-09-10 DIAGNOSIS — Z87891 Personal history of nicotine dependence: Secondary | ICD-10-CM | POA: Insufficient documentation

## 2023-09-10 DIAGNOSIS — J45909 Unspecified asthma, uncomplicated: Secondary | ICD-10-CM | POA: Diagnosis not present

## 2023-09-10 DIAGNOSIS — K208 Other esophagitis without bleeding: Secondary | ICD-10-CM | POA: Diagnosis not present

## 2023-09-10 DIAGNOSIS — I10 Essential (primary) hypertension: Secondary | ICD-10-CM | POA: Diagnosis not present

## 2023-09-10 DIAGNOSIS — E039 Hypothyroidism, unspecified: Secondary | ICD-10-CM | POA: Insufficient documentation

## 2023-09-10 NOTE — ED Triage Notes (Signed)
 Pt states that she was taking a pill tonight and feeling like it is stuck in her throat, able to swallow secretions. NAD noted

## 2023-09-11 ENCOUNTER — Emergency Department (HOSPITAL_BASED_OUTPATIENT_CLINIC_OR_DEPARTMENT_OTHER)
Admission: EM | Admit: 2023-09-11 | Discharge: 2023-09-11 | Disposition: A | Discharge status: Home / Self Care | Attending: Emergency Medicine | Admitting: Emergency Medicine

## 2023-09-11 DIAGNOSIS — T50905A Adverse effect of unspecified drugs, medicaments and biological substances, initial encounter: Secondary | ICD-10-CM

## 2023-09-11 MED ORDER — LIDOCAINE VISCOUS HCL 2 % MT SOLN
15.0000 mL | Freq: Once | OROMUCOSAL | Status: AC
  Administered 2023-09-11: 15 mL via ORAL
  Filled 2023-09-11: qty 15

## 2023-09-11 MED ORDER — ALUM & MAG HYDROXIDE-SIMETH 200-200-20 MG/5ML PO SUSP
30.0000 mL | Freq: Once | ORAL | Status: AC
  Administered 2023-09-11: 30 mL via ORAL
  Filled 2023-09-11: qty 30

## 2023-09-11 MED ORDER — ALUMINUM HYDROXIDE GEL 320 MG/5ML PO SUSP: 30.0000 mL | Freq: Four times a day (QID) | ORAL | 0 refills | Status: DC | PRN

## 2023-09-11 NOTE — Discharge Instructions (Signed)
 You were evaluated in the Emergency Department and after careful evaluation, we did not find any emergent condition requiring admission or further testing in the hospital.  Your exam/testing today was overall reassuring.  Symptoms likely due to irritation from the swallowed pill.  Continue your home medications, be sure to take them with plenty of water.  Recommend use of the aluminum hydroxide suspension as needed for discomfort.  Please return to the Emergency Department if you experience any worsening of your condition.  Thank you for allowing [place] to be a part of your care.

## 2023-09-11 NOTE — ED Provider Notes (Signed)
 [facility] Emergency Department Provider Note MRN:  [id number]  Arrival date & time: 09/11/23     Chief Complaint   Sore throat History of Present Illness   Marilyn [name] is a 58 y.o. year-old female with a history of asthma presenting to the ED with chief complaint of sore throat.  Swallowed her pantoprazole at 2 PM and it felt like it got stuck.  Has been having discomfort to the bottom of the throat since.  Also having eye irritation, ear irritation, stuffy nose, may have allergies.  Review of Systems  A thorough review of systems was obtained and all systems are negative except as noted in the HPI and PMH.   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   Neck pain 09/09/2011   Obesity 04/11/2014   Pericardial effusion 02/07/2022   Sleep apnea     Past Surgical History:  Procedure Laterality Date   BREAST BIOPSY Right 12/2014   BREAST EXCISIONAL BIOPSY Right 2016   BREAST LUMPECTOMY WITH RADIOACTIVE SEED LOCALIZATION Right 02/25/2015   Procedure: BREAST LUMPECTOMY WITH RADIOACTIVE SEED LOCALIZATION;  Surgeon: Ovidio Kin, MD;  Location: [facility];  Service: General;  Laterality: Right;   BREAST REDUCTION SURGERY  1986   BREAST SURGERY     broken jaw     CARPAL TUNNEL RELEASE  2001   COLONOSCOPY     FRACTURE SURGERY     REDUCTION MAMMAPLASTY Bilateral    THYROIDECTOMY     TUBAL LIGATION  1987   UPPER GASTROINTESTINAL ENDOSCOPY       Family History  Problem Relation Age of Onset   Asthma Mother    Hypertension Father    Diabetes Father    Hyperlipidemia Father    Breast cancer Sister 70   Thyroid disease Sister    Migraines Daughter    Breast cancer Maternal Aunt    Diabetes Maternal Grandmother    Heart disease Maternal Grandmother    Heart attack Maternal Grandmother 60   Stroke Maternal Grandmother    Asthma Maternal Grandfather    Heart attack Maternal Grandfather    Stroke Maternal Grandfather    Colon cancer Brother    Esophageal cancer Neg Hx    Stomach cancer Neg Hx    Rectal cancer Neg Hx     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ASST    Employer: [employer]  Tobacco Use   Smoking status: Former    Current packs/day: 0.00    Average packs/day: 0.3 packs/day for 3.0 years (0.8 ttl pk-yrs)    Types: Cigarettes    Start date: 06/04/1977    Quit date: 06/04/1980  Years since quitting: 43.2   Smokeless tobacco: Never  Vaping Use   Vaping status: Never Used  Substance and Sexual Activity   Alcohol use: No    Alcohol/week: 0.0 standard drinks of alcohol   Drug use: No   Sexual activity: Not Currently    Comment: 1st intercourse 51 yo-5 partners  Other Topics Concern   Not on file  Social History Narrative   Married   Social Drivers of Health   Financial Resource Strain: Low Risk  (07/26/2021)   Overall Financial Resource Strain (CARDIA)    Difficulty of Paying Living Expenses: Not hard at all  Food Insecurity: No Food Insecurity (07/26/2021)   Hunger Vital Sign    Worried About Running Out of Food in the Last Year: Never true    Ran Out of Food in the Last Year: Never true  Transportation Needs: No Transportation Needs (07/26/2021)   PRAPARE - Scientist, research (physical sciences) (Medical): No    Lack of Transportation (Non-Medical): No  Physical Activity: Insufficiently Active (07/26/2021)   Exercise Vital Sign    Days of Exercise per Week: 2 days    Minutes of Exercise per Session: 40 min  Stress: Not on file  Social Connections: Unknown (10/16/2021)   Received from [facility]   Social Network    Social Network: Not on file  Intimate Partner Violence: Unknown (09/07/2021)   Received from [facility], [facility]   HITS    Physically Hurt: Not on file    Insult or Talk Down To: Not on file    Threaten Physical Harm: Not on file    Scream or Curse: Not on file     Physical Exam   Vitals:   09/11/23 0311 09/11/23 0312  BP:    Pulse: 60 64  Resp:    Temp:    SpO2: 99% 98%    CONSTITUTIONAL: Well-appearing, NAD NEURO/PSYCH:  Alert and oriented x 3, no focal deficits EYES:  eyes equal and reactive ENT/NECK:  no LAD, no JVD CARDIO: Regular rate, well-perfused, normal S1 and S2 PULM:  CTAB no wheezing or rhonchi GI/GU:  non-distended, non-tender MSK/SPINE:  No gross deformities, no edema SKIN:  no rash, atraumatic   *Additional and/or pertinent findings included in MDM below  Diagnostic and Interventional Summary    EKG Interpretation Date/Time:    Ventricular Rate:    PR Interval:    QRS Duration:    QT Interval:    QTC Calculation:   R Axis:      Text Interpretation:         Labs Reviewed - No data to display  DG Neck Soft Tissue  Final Result      Medications  alum & mag hydroxide-simeth (MAALOX/MYLANTA) 200-200-20 MG/5ML suspension 30 mL (30 mLs Oral Given 09/11/23 0239)    And  lidocaine (XYLOCAINE) 2 % viscous mouth solution 15 mL (15 mLs Oral Given 09/11/23 0239)     Procedures  /  Critical Care Procedures  ED Course and Medical Decision Making  Initial Impression and Ddx Patient overall seems a bit anxious about her symptoms, having continued throat discomfort, seems pretty convinced  that she has something lodged in her throat.  Obtaining x-ray.  She is in no acute distress from a respiratory perspective, clear lungs, no stridor, normal vitals, normal O2 saturation.  Past medical/surgical history that increases complexity of ED encounter: None  Interpretation of Diagnostics I personally reviewed the neck plain  film and my interpretation is as follows: Normal    Patient Reassessment and Ultimate Disposition/Management     After some reassurance and time and GI cocktail patient is feeling better and there is no emergent process and patient is appropriate for discharge.  Patient management required discussion with the following services or consulting groups:  None  Complexity of Problems Addressed Acute illness or injury that poses threat of life of bodily function  Additional Data Reviewed and Analyzed Further history obtained from: Past medical history and medications listed in the EMR  Additional Factors Impacting ED Encounter Risk Prescriptions  Elmer Sow. Pilar Plate, MD [facility] Emergency Medicine [facility] [email or web address]  Final Clinical Impressions(s) / ED Diagnoses     ICD-10-CM   1. Pill esophagitis  K20.80    T50.905A       ED Discharge Orders          Ordered    aluminum hydroxide (AMPHOJEL/ALTERNAGEL) 320 MG/5ML suspension  Every 6 hours PRN        09/11/23 0348             Discharge Instructions Discussed with and Provided to Patient:    Discharge Instructions      You were evaluated in the Emergency Department and after careful evaluation, we did not find any emergent condition requiring admission or further testing in the hospital.  Your exam/testing today was overall reassuring.  Symptoms likely due to irritation from the swallowed pill.  Continue your home medications, be sure to take them with plenty of water.  Recommend use of the aluminum hydroxide suspension as needed for discomfort.  Please return  to the Emergency Department if you experience any worsening of your condition.  Thank you for allowing [place] to be a part of your care.       Sabas Sous, MD 09/11/23 [phone]

## 2023-09-12 ENCOUNTER — Encounter

## 2023-09-12 ENCOUNTER — Ambulatory Visit
Admission: RE | Admit: 2023-09-12 | Discharge: 2023-09-12 | Disposition: A | Discharge status: Home / Self Care | Source: Ambulatory Visit | Attending: Family Medicine

## 2023-09-12 DIAGNOSIS — Z0000 Encounter for general adult medical examination without abnormal findings: Secondary | ICD-10-CM

## 2023-09-16 ENCOUNTER — Ambulatory Visit: Payer: 59 | Admitting: Gastroenterology

## 2023-09-16 ENCOUNTER — Other Ambulatory Visit (INDEPENDENT_AMBULATORY_CARE_PROVIDER_SITE_OTHER)

## 2023-09-16 ENCOUNTER — Encounter: Payer: Self-pay | Admitting: Gastroenterology

## 2023-09-16 VITALS — BP 130/80 | HR 110 | Ht 63.0 in | Wt 199.0 lb

## 2023-09-16 DIAGNOSIS — R1030 Lower abdominal pain, unspecified: Secondary | ICD-10-CM

## 2023-09-16 DIAGNOSIS — R1310 Dysphagia, unspecified: Secondary | ICD-10-CM

## 2023-09-16 DIAGNOSIS — R1084 Generalized abdominal pain: Secondary | ICD-10-CM

## 2023-09-16 DIAGNOSIS — K222 Esophageal obstruction: Secondary | ICD-10-CM | POA: Insufficient documentation

## 2023-09-16 DIAGNOSIS — R194 Change in bowel habit: Secondary | ICD-10-CM

## 2023-09-16 DIAGNOSIS — K219 Gastro-esophageal reflux disease without esophagitis: Secondary | ICD-10-CM

## 2023-09-16 LAB — BASIC METABOLIC PANEL WITH GFR
BUN: 14 mg/dL (ref 6–23)
CO2: 29 meq/L (ref 19–32)
Calcium: 8.7 mg/dL (ref 8.4–10.5)
Chloride: 104 meq/L (ref 96–112)
Creatinine, Ser: 0.85 mg/dL (ref 0.40–1.20)
GFR: 74.8 mL/min (ref >60.00)
Glucose, Bld: 98 mg/dL (ref 70–99)
Potassium: 4 meq/L (ref 3.5–5.1)
Sodium: 140 meq/L (ref 135–145)

## 2023-09-16 MED ORDER — BENEFIBER PO POWD: ORAL | Status: DC

## 2023-09-16 NOTE — Progress Notes (Signed)
 09/16/2023 Marilyn [name] [id number] 04-16-1966   HISTORY OF PRESENT ILLNESS: This is a 58 year old female who is previously patient of Dr. Ardell Isaacs.  Her care will be assigned to Dr. Leone Payor.  She has been followed here previously for issues with GERD, esophageal stenosis, colonoscopy for family history of colon cancer in first-degree relative.  She is here today with primary complaint of reflux and dysphagia.  Says that she has been having issues again with swallowing both solids and liquids.  Says that she swallowed one of her Protonix last week and thinks that it went down sideways and irritated the back of her throat.  She went to the ER for that.  Still having a lot of's issues despite taking that medication.  Has an appointment to see ENT in May as well.  She reports pain in her lower abdomen that she describes as heaviness or pressure feeling like there is bricks in there.  Has fibroids.  Has not seen GYN in a few years as she has missed her appointments.  Says that the pain is significant at times when she tries to get up from bed, etc.  While she is here she also mentions that she has had change in bowel habits over the past few months.  Says that she now only has 1 or 2 bowel movements a day instead of the typical 3-4 bowel movements a day that she was used to having.  No rectal bleeding.  CT scan abdomen and pelvis with contrast February 2023:  IMPRESSION: 1. No evidence of pulmonary embolus. 2. Trace pericardial effusion. 3. Minimal sigmoid diverticulosis without diverticulitis. 4. Fibroid uterus. 5.  Aortic Atherosclerosis (ICD10-I70.0).   EGD 04/2018: - Benign- appearing esophageal stenosis. Dilated. - Small hiatal hernia. - Normal duodenal bulb and second portion of the duodenum. - No specimens collected.  Colonoscopy March 2024: - The examined portion of the terminal ileum was normal. - One 6 mm polyp in the transverse colon, removed with a cold snare. Resected  and retrieved. - External and internal hemorrhoids. - Multiple small erosions in the ascending colon, in the cecum and at the IC valve. Biopsied. - The examination was otherwise normal on direct and retroflexion views.  1. Surgical [P], colon, cecum and ascending erosion bx's - COLONIC MUCOSA WITH MILD TO MODERATE ACTIVITY (CRYPTITIS AND FOCAL CRYPT ABSCESS FORMATION) IN THE BACKGROUND OF A DENSE LAMINA PROPRIA LYMPHOPLASMACYTOSIS. NOTE: THERE IS MINIMAL ARCHITECTURAL DISARRAY SUGGESTIVE OF BUT NOT PATHOGNOMONIC OF THE PRESENCE OF CHRONICITY. THERE IS NO EVIDENCE OF GRANULOMAS, VIRAL CYTOPATHIC EFFECT OR DYSPLASIA IN THE ABOVE BIOPSIES. THE MORPHOLOGIC FINDINGS ARE CONCERNING FOR A POSSIBLE INFLAMMATORY BOWEL DISEASE; HOWEVER, INFECTIOUS ETIOLOGIES AS WELL AS NSAIDS CANNOT BE COMPLETELY EXCLUDED. CLINICAL/ENDOSCOPIC CORRELATION NECESSARY 2. Surgical [P], transverse colon polyp x1, polyp (1) - HYPERPLASTIC POLYP.  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   Neck pain 09/09/2011   Obesity 04/11/2014   Pericardial effusion 02/07/2022  Sleep apnea    Past Surgical History:  Procedure Laterality Date   BREAST BIOPSY Right 12/2014   BREAST EXCISIONAL BIOPSY Right 2016   BREAST LUMPECTOMY WITH RADIOACTIVE SEED LOCALIZATION Right 02/25/2015   Procedure: BREAST LUMPECTOMY WITH RADIOACTIVE SEED LOCALIZATION;  Surgeon: Ovidio Kin, MD;  Location: [facility];  Service: General;  Laterality: Right;   BREAST REDUCTION  SURGERY  1986   BREAST SURGERY     broken jaw     CARPAL TUNNEL RELEASE  2001   COLONOSCOPY     FRACTURE SURGERY     REDUCTION MAMMAPLASTY Bilateral    THYROIDECTOMY     TUBAL LIGATION  1987   UPPER GASTROINTESTINAL ENDOSCOPY      reports that she quit smoking about 43 years ago. Her smoking use included cigarettes. She started smoking about 46 years ago. She has a 0.8 pack-year smoking history. She has never used smokeless tobacco. She reports that she does not drink alcohol and does not use drugs. family history includes Asthma in her maternal grandfather and mother; Breast cancer in her maternal aunt; Breast cancer (age of onset: 60) in her sister; Colon cancer in her brother; Diabetes in her father and maternal grandmother; Heart attack in her maternal grandfather; Heart attack (age of onset: 60) in her maternal grandmother; Heart disease in her maternal grandmother; Hyperlipidemia in her father; Hypertension in her father; Migraines in her daughter; Stroke in her maternal grandfather and maternal grandmother; Thyroid disease in her sister. Allergies  Allergen Reactions   Promethazine-Dm    Rosalia Bing Allergy] Anaphylaxis   Codeine Nausea And Vomiting   Fluticasone Propionate Other (See Comments)   Promethazine Other (See Comments)   Atorvastatin     Muscle aches    Flonase [Fluticasone Propionate] Other (See Comments)    Nose bleeds      Outpatient Encounter Medications as of 09/16/2023  Medication Sig   acetaminophen (TYLENOL) 500 MG tablet Take 1 tablet (500 mg total) by mouth every 6 (six) hours as needed.   albuterol (VENTOLIN HFA) 108 (90 Base) MCG/ACT inhaler Inhale 1-2 puffs into the lungs every 6 (six) hours as needed for wheezing or shortness of breath.   aluminum hydroxide (AMPHOJEL/ALTERNAGEL) 320 MG/5ML suspension Take 30 mLs by mouth every 6 (six) hours as needed for indigestion or heartburn.   amLODipine (NORVASC) 10 MG tablet Take 1 tablet (10 mg total) by mouth  daily.   aspirin EC 81 MG tablet Take 1 tablet (81 mg total) by mouth daily. Swallow whole.   atorvastatin (LIPITOR) 20 MG tablet Take 20 mg by mouth daily.   Blood Glucose Monitoring Suppl (ONETOUCH VERIO FLEX SYSTEM) w/Device KIT Use to check blood glucose TID or prn   Blood Pressure Monitoring (COMFORT TOUCH BP CUFF/LARGE) MISC Please measure blood pressure 1 time per day 1-2 hours after taking medication and resting for 5-10 minutes, please keep a log and bring to all future appointments. DX I10   budesonide-formoterol (SYMBICORT) 80-4.5 MCG/ACT inhaler Inhale 1-2 puffs into the lungs 2 (two) times daily.   cetirizine (ZYRTEC) 10 MG tablet Take 1 tablet (10 mg total) by mouth daily.   Cholecalciferol (VITAMIN D) 125 MCG (5000 UT) CAPS Take 1 tablet by mouth daily.   cyclobenzaprine (FLEXERIL) 5 MG tablet Take 1 tablet (5 mg total) by mouth 3 (three) times daily as needed.   EPINEPHrine (EPIPEN 2-PAK) 0.3 mg/0.3 mL IJ SOAJ injection Inject 0.3 mg into the muscle as needed for anaphylaxis.  ibuprofen (ADVIL) 600 MG tablet Take 1 tablet (600 mg total) by mouth every 6 (six) hours as needed.   Lancets (ONETOUCH DELICA PLUS LANCET30G) MISC USE LANCETS THREE TIMES A DAY IN CONJUNCTION WITH BLOOD GLUCOSE MONITORING   levothyroxine (SYNTHROID) 100 MCG tablet TAKE 1 TABLET BY MOUTH DAILY   metoprolol succinate (TOPROL-XL) 25 MG 24 hr tablet Take 1 tablet (25 mg total) by mouth daily.   naproxen (NAPROSYN) 500 MG tablet Take 1 tablet (500 mg total) by mouth 2 (two) times daily.   ondansetron (ZOFRAN) 4 MG tablet Take 1 tablet (4 mg total) by mouth every 8 (eight) hours as needed for nausea or vomiting.   ondansetron (ZOFRAN-ODT) 4 MG disintegrating tablet Take 1 tablet (4 mg total) by mouth every 8 (eight) hours as needed for nausea or vomiting.   ONETOUCH VERIO test strip TEST AS DIRECTED THREE TIMES A DAY   pantoprazole (PROTONIX) 40 MG tablet Take 1 tablet (40 mg total) by mouth 2 (two) times daily.    vitamin B-12 (CYANOCOBALAMIN) 500 MCG tablet Take 500 mcg by mouth daily.   No facility-administered encounter medications on file as of 09/16/2023.    REVIEW OF SYSTEMS  : All other systems reviewed and negative except where noted in the History of Present Illness.   PHYSICAL EXAM: BP 130/80   Pulse (!) 110   Ht 5\' 3"  (1.6 m)   Wt 199 lb (90.3 kg)   LMP 02/21/2013   BMI 35.25 kg/m  General: Well developed female in no acute distress Head: Normocephalic and atraumatic Eyes:  Sclerae anicteric, conjunctiva pink. Ears: Normal auditory acuity Lungs: Clear throughout to auscultation; no W/R/R/ Heart: Tachy but regular rhythm; no M/R/G. Abdomen: Soft, non-distended.  BS present.  TTP mostly in the suprapubic region. Musculoskeletal: Symmetrical with no gross deformities  Skin: No lesions on visible extremities Neurological: Alert oriented x 4, grossly non-focal Psychological:  Alert and cooperative. Normal mood and affect  ASSESSMENT AND PLAN: *GERD/dysphagia/history of esophageal stenosis: Had EGD with dilation in 2019.  Describing recurrence with some issues swallowing solids and liquids.  She also has an appointment with ENT in May.  Will schedule for EGD with possible dilation with Dr. Willy Harvest.   *Abdominal pain: Generalized, but mostly in the lower abdomen.  Tender suprapubically.  CT scan a couple years ago that showed some fibroids.  Has not seen GYN in a few years.  Will check another CT scan of the abdomen and pelvis with contrast.  I have also asked her to make sure she makes follow-up appointment with her GYN to see if fibroids are the cause of her pain especially since she describes it as a heaviness. *Change in bowel habits: She reports only having 1-2 bowel movements a day instead of usual 3-4 bowel movements a day for the past few months.  Advised that this can vary with diet, medications, etc.  Suggested she begin Benefiber starting with 2 teaspoons mixed in 8 ounces of  liquid daily.   CC:  Viola Greulich, MD  GI Clinic Attending MD:  Agree w/ above.  Need to keep in mind that the inflammatory changes seen at 2024 colonoscopy, though non-specific.  Kenney Peacemaker, MD, [facility]

## 2023-09-16 NOTE — Patient Instructions (Addendum)
  You have been scheduled for an Upper endoscopy with Dr. Leone Payor. Please follow written instructions given to you at your visit today.  If you use inhalers (even only as needed), please bring them with you on the day of your procedure.  If you take any of the following medications, they will need to be adjusted prior to your procedure:   DO NOT TAKE 7 DAYS PRIOR TO TEST- Trulicity (dulaglutide) Ozempic, Wegovy (semaglutide) Mounjaro (tirzepatide) Bydureon Bcise (exanatide extended release)  DO NOT TAKE 1 DAY PRIOR TO YOUR TEST Rybelsus (semaglutide) Adlyxin (lixisenatide) Victoza (liraglutide) Byetta (exanatide) ___________________________________________________________________________  Please go to the lab in the basement of our building to have lab work done as you leave today. Hit "B" for basement when you get on the elevator.  When the doors open the lab is on your left.  We will call you with the results. Thank you. ___________________________________________________________________________  Marilyn [name] have been scheduled for a CT scan of the abdomen and pelvis at [facility], 1st floor Radiology. You are scheduled on Thursday, 4-17 at 4:00 pm. You should arrive 30 minutes prior to your appointment time for registration.    Plan on being there 30 to 60 minutes, depending on the type of exam you are having performed.   If you have any questions regarding your exam or if you need to reschedule, you may call [facility] Radiology Scheduling at [phone] between the hours of 8:00 am and 5:00 pm, Monday-Friday.  _______________________________________________________________________________  [name] daily: Add 2 teaspoons to 6 to 8 ounces of water or juice  Please follow up with your GYN provider.  Thank you for entrusting me with your care and for choosing [place], [facility] P[facility] - C.     f your blood pressure at your visit was 140/90 or greater,  please contact your primary care physician to follow up on this. ______________________________________________________  If you are age 60 or older, your body mass index should be between 23-30. Your Body mass index is 35.25 kg/m. If this is out of the aforementioned range listed, please consider follow up with your Primary Care Provider.  If you are age 20 or younger, your body mass index should be between 19-25. Your Body mass index is 35.25 kg/m. If this is out of the aformentioned range listed, please consider follow up with your Primary Care Provider.  ________________________________________________________  The [facility] providers would like to encourage you to use [facility] to communicate with providers for non-urgent requests or questions.  Due to long hold times on the telephone, sending your provider a message by [facility] may be a faster and more efficient way to get a response.  Please allow 48 business hours for a response.  Please remember that this is for non-urgent requests.  _______________________________________________________  Due to recent changes in healthcare laws, you may see the results of your imaging and laboratory studies on MyChart before your provider has had a chance to review them.  We understand that in some cases there may be results that are confusing or concerning to you. Not all laboratory results come back in the same time frame and the provider may be waiting for multiple results in order to interpret others.  Please give [place] 48 hours in order for your provider to thoroughly review all the results before contacting the office for clarification of your results.

## 2023-09-17 ENCOUNTER — Encounter: Payer: Self-pay | Admitting: Internal Medicine

## 2023-09-17 ENCOUNTER — Encounter: Admitting: Internal Medicine

## 2023-09-17 ENCOUNTER — Ambulatory Visit (AMBULATORY_SURGERY_CENTER): Admitting: Internal Medicine

## 2023-09-17 VITALS — BP 114/75 | HR 77 | Temp 98.0°F | Resp 18 | Ht 63.0 in | Wt 199.0 lb

## 2023-09-17 DIAGNOSIS — K449 Diaphragmatic hernia without obstruction or gangrene: Secondary | ICD-10-CM

## 2023-09-17 DIAGNOSIS — K222 Esophageal obstruction: Secondary | ICD-10-CM | POA: Diagnosis present

## 2023-09-17 DIAGNOSIS — R1310 Dysphagia, unspecified: Secondary | ICD-10-CM | POA: Diagnosis not present

## 2023-09-17 DIAGNOSIS — R1084 Generalized abdominal pain: Secondary | ICD-10-CM

## 2023-09-17 MED ORDER — SODIUM CHLORIDE 0.9 % IV SOLN: 500.0000 mL | Freq: Once | INTRAVENOUS | Status: DC

## 2023-09-17 NOTE — Patient Instructions (Addendum)
 I dilated the esophagus today, hopefully that will help some.  I do think there is more than one process ongoing so be sure to follow-up with pulmonary and see ENT as planned plus gynecology for the fibroids.  We will contact you with CT scan results.  I appreciate the opportunity to care for you. Iva Boop, MD, [facility]  Dilation diet today - clear liquids X 1 hour then soft foods rest of the day. Start previous diet tomorrow 09/18/2023.   YOU HAD AN ENDOSCOPIC PROCEDURE TODAY AT THE [facility]:   Refer to the procedure report that was given to you for any specific questions about what was found during the examination.  If the procedure report does not answer your questions, please call your gastroenterologist to clarify.  If you requested that your care partner not be given the details of your procedure findings, then the procedure report has been included in a sealed envelope for you to review at your convenience later.  YOU SHOULD EXPECT: Some feelings of bloating in the abdomen. Passage of more gas than usual.  Walking can help get rid of the air that was put into your GI tract during the procedure and reduce the bloating. If you had a lower endoscopy (such as a colonoscopy or flexible sigmoidoscopy) you may notice spotting of blood in your stool or on the toilet paper. If you underwent a bowel prep for your procedure, you may not have a normal bowel movement for a few days.  Please Note:  You might notice some irritation and congestion in your nose or some drainage.  This is from the oxygen used during your procedure.  There is no need for concern and it should clear up in a day or so.  SYMPTOMS TO REPORT IMMEDIATELY:  Following upper endoscopy (EGD)  Vomiting of blood or coffee ground material  New chest pain or pain under the shoulder blades  Painful or persistently difficult swallowing  New shortness of breath  Fever of 100F or higher  Black, tarry-looking  stools  For urgent or emergent issues, a gastroenterologist can be reached at any hour by calling [phone]. Do not use MyChart messaging for urgent concerns.    DIET: Drink plenty of fluids but you should avoid alcoholic beverages for 24 hours.  ACTIVITY:  You should plan to take it easy for the rest of today and you should NOT DRIVE or use heavy machinery until tomorrow (because of the sedation medicines used during the test).    FOLLOW UP: Our staff will call the number listed on your records the next business day following your procedure.  We will call around 7:15- 8:00 am to check on you and address any questions or concerns that you may have regarding the information given to you following your procedure. If we do not reach you, we will leave a message.     If any biopsies were taken you will be contacted by phone or by letter within the next 1-3 weeks.  Please call us at [phone] if you have not heard about the biopsies in 3 weeks.    SIGNATURES/CONFIDENTIALITY: You and/or your care partner have signed paperwork which will be entered into your electronic medical record.  These signatures attest to the fact that that the information above on your After Visit Summary has been reviewed and is understood.  Full responsibility of the confidentiality of this discharge information lies with you and/or your care-partner.

## 2023-09-17 NOTE — Op Note (Signed)
 [facility] Patient Name: Marilyn [name] Procedure Date: 09/17/2023 9:24 AM MRN: [id number] Endoscopist: Iva Boop , MD, [id number] Age: 58 Referring MD:  Date of Birth: 11/13/1965 Gender: Female Account #: [id number] Procedure:                Upper GI endoscopy Indications:              Generalized abdominal pain, Dysphagia Medicines:                Monitored Anesthesia Care Procedure:                Pre-Anesthesia Assessment:                           - Prior to the procedure, a History and Physical                            was performed, and patient medications and                            allergies were reviewed. The patient's tolerance of                            previous anesthesia was also reviewed. The risks                            and benefits of the procedure and the sedation                            options and risks were discussed with the patient.                            All questions were answered, and informed consent                            was obtained. Prior Anticoagulants: The patient has                            taken no anticoagulant or antiplatelet agents. ASA                            Grade Assessment: III - A patient with severe                            systemic disease. After reviewing the risks and                            benefits, the patient was deemed in satisfactory                            condition to undergo the procedure.                           After obtaining informed consent, the endoscope was  passed under direct vision. Throughout the                            procedure, the patient's blood pressure, pulse, and                            oxygen saturations were monitored continuously. The                            Olympus Scope [id number] was introduced through the                            mouth, and advanced to the second part of duodenum.                            The upper  GI endoscopy was accomplished without                            difficulty. The patient tolerated the procedure. Scope In: Scope Out: Findings:                 One benign-appearing, intrinsic moderate                            (circumferential scarring or stenosis; an endoscope                            may pass) stenosis was found at the                            gastroesophageal junction. The stenosis was                            traversed. A TTS dilator was passed through the                            scope. Dilation with an 18-19-20 mm balloon dilator                            was performed to 20 mm. The dilation site was                            examined and showed no change. Estimated blood                            loss: none.                           A 3 cm hiatal hernia was present.                           The exam was otherwise without abnormality.                           The cardia and gastric fundus  were normal on                            retroflexion.                           The gastroesophageal flap valve was visualized                            endoscopically and classified as Hill Grade IV (no                            fold, wide open lumen, hiatal hernia present). Complications:            No immediate complications. Estimated Blood Loss:     Estimated blood loss: none. Impression:               - Benign-appearing esophageal stenosis. Dilated.                           - 3 cm hiatal hernia.                           - The examination was otherwise normal.                           - Gastroesophageal flap valve classified as Hill                            Grade IV (no fold, wide open lumen, hiatal hernia                            present).                           - No specimens collected. Recommendation:           - Patient has a contact number available for                            emergencies. The signs and symptoms of potential                             delayed complications were discussed with the                            patient. Return to normal activities tomorrow.                            Written discharge instructions were provided to the                            patient.                           - Clear liquids x 1 hour then soft foods rest of  day. Start prior diet tomorrow.                           - Continue present medications.                           - Keep follow-up with pulmonary and see ENT as                            planned. Multifactorial process I believe. Her                            dysphagia may be more oropharyngeal. Limited                            pharyngeal views looked normal to me today but will                            defer to ENT on this.                           - She has a CT abd/pelvis scan ordered already for                            abdominal pain and also is supposed to se GYN re:                            fibroids Kenney Peacemaker, MD 09/17/2023 10:01:30 AM This report has been signed electronically.

## 2023-09-17 NOTE — Progress Notes (Signed)
 Pt's states no medical or surgical changes since previsit or office visit.

## 2023-09-17 NOTE — Progress Notes (Signed)
 History and Physical Interval Note:  09/17/2023 9:27 AM  Marilyn [name]  has presented today for endoscopic procedure(s), with the diagnosis of  Encounter Diagnoses  Name Primary?   Generalized abdominal pain Yes   Dysphagia, unspecified type   .  The various methods of evaluation and treatment have been discussed with the patient and/or family. After consideration of risks, benefits and other options for treatment, the patient has consented to  the endoscopic procedure(s).   The patient's history has been reviewed, patient examined, no change in status, stable for endoscopic procedure(s).  I have reviewed the patient's chart and labs.  Questions were answered to the patient's satisfaction.     Kenney Peacemaker, MD, [facility]

## 2023-09-17 NOTE — Progress Notes (Signed)
 Called to room to assist during endoscopic procedure.  Patient ID and intended procedure confirmed with present staff. Received instructions for my participation in the procedure from the performing physician.

## 2023-09-17 NOTE — Progress Notes (Signed)
 Sedate, gd SR, tolerated procedure well, VSS, report to RN

## 2023-09-18 ENCOUNTER — Telehealth: Payer: Self-pay | Admitting: *Deleted

## 2023-09-18 ENCOUNTER — Encounter: Payer: Self-pay | Admitting: Oncology

## 2023-09-18 NOTE — Telephone Encounter (Signed)
  Follow up Call-     09/17/2023    8:47 AM 08/23/2022   10:45 AM  Call back number  Post procedure Call Back phone  # [phone]  Permission to leave phone message Yes Yes     Patient questions:  Do you have a fever, pain , or abdominal swelling? No. Pain Score  3 *  Have you tolerated food without any problems? No.  Have you been able to return to your normal activities? Yes.    Do you have any questions about your discharge instructions: Diet   No. Medications  No. Follow up visit  No.  Do you have questions or concerns about your Care? No.  Actions: * If pain score is 4 or above: No action needed, pain <4.pt says throat is still sore ,has not been drinking or eating because throat feels sore .Encouraged pt to drink liquids keeping throat moist and swallowing then advance to soft foods as tolerated and to call back if discomfort doesn't improve in a few hours after drinking liquids

## 2023-09-19 ENCOUNTER — Ambulatory Visit (HOSPITAL_COMMUNITY)
Admission: RE | Admit: 2023-09-19 | Discharge: 2023-09-19 | Disposition: A | Discharge status: Home / Self Care | Source: Ambulatory Visit | Attending: Gastroenterology | Admitting: Gastroenterology

## 2023-09-19 DIAGNOSIS — R1084 Generalized abdominal pain: Secondary | ICD-10-CM | POA: Diagnosis present

## 2023-09-19 DIAGNOSIS — R1030 Lower abdominal pain, unspecified: Secondary | ICD-10-CM | POA: Diagnosis present

## 2023-09-19 MED ORDER — IOHEXOL 300 MG/ML  SOLN
100.0000 mL | Freq: Once | INTRAMUSCULAR | Status: AC | PRN
  Administered 2023-09-19: 100 mL via INTRAVENOUS

## 2023-09-24 ENCOUNTER — Encounter (HOSPITAL_BASED_OUTPATIENT_CLINIC_OR_DEPARTMENT_OTHER): Payer: Self-pay | Admitting: Pulmonary Disease

## 2023-09-24 ENCOUNTER — Ambulatory Visit (HOSPITAL_BASED_OUTPATIENT_CLINIC_OR_DEPARTMENT_OTHER): Admitting: Pulmonary Disease

## 2023-09-24 DIAGNOSIS — R0602 Shortness of breath: Secondary | ICD-10-CM | POA: Diagnosis not present

## 2023-09-24 LAB — PULMONARY FUNCTION TEST
DL/VA % pred: 111 %
DL/VA: 4.76 ml/min/mmHg/L
DLCO cor % pred: 118 %
DLCO cor: 22.91 ml/min/mmHg
DLCO unc % pred: 118 %
DLCO unc: 22.91 ml/min/mmHg
FEF 25-75 Post: 2.65 L/s
FEF 25-75 Pre: 2.78 L/s
FEF2575-%Change-Post: -4 %
FEF2575-%Pred-Post: 114 %
FEF2575-%Pred-Pre: 120 %
FEV1-%Change-Post: 2 %
FEV1-%Pred-Post: 97 %
FEV1-%Pred-Pre: 95 %
FEV1-Post: 2.38 L
FEV1-Pre: 2.32 L
FEV1FVC-%Change-Post: 3 %
FEV1FVC-%Pred-Pre: 107 %
FEV6-%Change-Post: -1 %
FEV6-%Pred-Post: 89 %
FEV6-%Pred-Pre: 90 %
FEV6-Post: 2.73 L
FEV6-Pre: 2.76 L
FEV6FVC-%Pred-Post: 103 %
FEV6FVC-%Pred-Pre: 103 %
FVC-%Change-Post: 0 %
FVC-%Pred-Post: 86 %
FVC-%Pred-Pre: 87 %
FVC-Post: 2.74 L
FVC-Pre: 2.76 L
Post FEV1/FVC ratio: 87 %
Post FEV6/FVC ratio: 100 %
Pre FEV1/FVC ratio: 84 %
Pre FEV6/FVC Ratio: 100 %
RV % pred: 76 %
RV: 1.44 L
TLC % pred: 96 %
TLC: 4.68 L

## 2023-09-24 NOTE — Patient Instructions (Signed)
 Full PFT Performed Today

## 2023-09-24 NOTE — Progress Notes (Signed)
 Full PFT Performed Today

## 2023-09-25 NOTE — Telephone Encounter (Signed)
 Spoke with the pt and discussed results. No questions at this time. She will follow up with GYN

## 2023-09-25 NOTE — Telephone Encounter (Signed)
 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 Patient is requesting a call back. Please advise.

## 2023-10-06 ENCOUNTER — Other Ambulatory Visit: Payer: Self-pay | Admitting: Family Medicine

## 2023-10-11 ENCOUNTER — Encounter (HOSPITAL_BASED_OUTPATIENT_CLINIC_OR_DEPARTMENT_OTHER): Payer: Self-pay

## 2023-10-16 ENCOUNTER — Ambulatory Visit: Admitting: Family Medicine

## 2023-10-16 VITALS — BP 124/72 | HR 93 | Temp 98.2°F | Ht 62.5 in | Wt 205.2 lb

## 2023-10-16 DIAGNOSIS — J0100 Acute maxillary sinusitis, unspecified: Secondary | ICD-10-CM | POA: Diagnosis not present

## 2023-10-16 MED ORDER — AMOXICILLIN-POT CLAVULANATE 400-57 MG/5ML PO SUSR: 500.0000 mg | Freq: Two times a day (BID) | ORAL | 0 refills | Status: DC

## 2023-10-16 NOTE — Progress Notes (Signed)
 Established Patient Office Visit   Subjective  Patient ID: Marilyn [name], female    DOB: Feb 11, 1966  Age: 58 y.o. MRN: [id number]  Chief Complaint  Patient presents with   Cough    Congestion cough, ear pressure, SOB, sinus pressure, and wheezing, started on Sunday     Patient is a 58 year old female seen for acute concern.  Patient endorses ongoing slightly productive cough, congestion, ears feeling clogged/increased pressure, shortness of breath, wheezing, facial edema, sinus pressure for a few weeks.  Symptoms started after pt was in the rain around Mother's Day.  Patient tried OTC medication such as Mucinex .  Steam from shower was not effective.  Patient states she is planning to retire this year due to increased stress and becoming sick more frequently.  Patient also notes a subtle tapping like sharp pain in back beginning to start again.  Not as severe as previous episode of back pain.    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   Hypoxemia associated with sleep 02/23/2022   Pericardial effusion 02/07/2022   Aortic atherosclerosis (HCC) 02/07/2022   Essential hypertension 07/26/2021   Hyperlipidemia associated with type 2 diabetes mellitus (HCC) 08/04/2020   Diet-controlled type 2 diabetes mellitus (HCC) 07/20/2019   Swollen neck 04/09/2018   Urinary retention 08/05/2017   Atypical chest pain 01/23/2017   Nausea & vomiting 07/16/2015   RUQ abdominal pain 07/16/2015   RLQ abdominal pain 07/15/2015   Pain in lower back 11/30/2014   Acute upper respiratory infection 05/05/2014   Allergic rhinitis 05/05/2014   Hoarseness 05/05/2014   Dysphagia 05/05/2014   Obesity 04/11/2014   Edema, peripheral 04/11/2014   Cough 12/20/2012   Hypersomnolence 12/15/2012   Exertional dyspnea 12/15/2012   Right shoulder pain 12/15/2012   Abdominal pain 11/15/2011   Diabetes mellitus with  coincident hypertension (HCC) 09/09/2011   Neck pain 09/09/2011   Preventative health care 04/30/2011   HOT FLASHES 12/22/2009   CHEST PAIN 01/29/2009   Acute non-recurrent maxillary sinusitis 09/25/2008   HYPERTHYROIDISM 01/31/2007   GERD 01/31/2007   Hypothyroidism 01/30/2007   Hyperlipidemia 01/30/2007   Iron deficiency anemia 01/30/2007   ASTHMA 01/30/2007   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   Aortic atherosclerosis (HCC) 02/07/2022   Arthritis    ASTHMA 01/30/2007   Asthma    Breast tumor    right; benign   Chest pain 01/24/2017   CHEST PAIN 01/29/2009   Qualifier: Diagnosis of  By: Joelle Musca MD, Oddis Bench    Essential hypertension 07/26/2021   GERD 01/31/2007   Hemorrhoids    Hiatal hernia    Hyperlipidemia    Hypersomnolence 12/15/2012   HYPOTHYROIDISM 01/30/2007   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   Neck pain 09/09/2011   Obesity 04/11/2014   Pericardial effusion 02/07/2022   Sleep apnea    Past Surgical History:  Procedure Laterality Date   BREAST BIOPSY Right 12/2014   BREAST EXCISIONAL BIOPSY Right 2016   BREAST LUMPECTOMY WITH RADIOACTIVE SEED LOCALIZATION Right 02/25/2015   Procedure: BREAST LUMPECTOMY WITH RADIOACTIVE SEED LOCALIZATION;  Surgeon: Juanita Norlander, MD;  Location: [facility];  Service: General;  Laterality: Right;   BREAST REDUCTION SURGERY  1986   BREAST SURGERY     broken jaw     CARPAL TUNNEL RELEASE  2001   COLONOSCOPY     FRACTURE SURGERY  REDUCTION MAMMAPLASTY Bilateral    THYROIDECTOMY     TUBAL LIGATION  1987   UPPER GASTROINTESTINAL ENDOSCOPY     Social History   Tobacco Use   Smoking status: Former    Current packs/day: 0.00    Average packs/day: 0.3 packs/day for 3.0 years (0.8 ttl pk-yrs)    Types: Cigarettes    Start date: 06/04/1977    Quit date: 06/04/1980    Years since quitting: 43.3   Smokeless tobacco: Never  Vaping Use   Vaping status: Never Used   Substance Use Topics   Alcohol  use: No    Alcohol /week: 0.0 standard drinks of alcohol    Drug use: No   Family History  Problem Relation Age of Onset   Asthma Mother    Hypertension Father    Diabetes Father    Hyperlipidemia Father    Breast cancer Sister 57   Thyroid  disease Sister    Migraines Daughter    Breast cancer Maternal Aunt    Diabetes Maternal Grandmother    Heart disease Maternal Grandmother    Heart attack Maternal Grandmother 60   Stroke Maternal Grandmother    Asthma Maternal Grandfather    Heart attack Maternal Grandfather    Stroke Maternal Grandfather    Colon cancer Brother    Esophageal cancer Neg Hx    Stomach cancer Neg Hx    Rectal cancer Neg Hx    Allergies  Allergen Reactions   Promethazine -Dm    Tuna [Fish Allergy] Anaphylaxis   Codeine Nausea And Vomiting   Fluticasone  Propionate Other (See Comments)   Promethazine  Other (See Comments)   Atorvastatin      Muscle aches    Flonase  [Fluticasone  Propionate] Other (See Comments)    Nose bleeds    ROS Negative unless stated above    Objective:      BP 124/72 (BP Location: Left Arm, Patient Position: Sitting, Cuff Size: Normal)   Pulse 93   Temp 98.2 F (36.8 C) (Oral)   Ht 5' 2.5" (1.588 m)   Wt 205 lb 3.2 oz (93.1 kg)   LMP 02/21/2013   SpO2 95%   BMI 36.93 kg/m  BP Readings from Last 3 Encounters:  10/16/23 124/72  09/17/23 114/75  09/16/23 130/80   Wt Readings from Last 3 Encounters:  10/16/23 205 lb 3.2 oz (93.1 kg)  09/24/23 195 lb 9.6 oz (88.7 kg)  09/17/23 199 lb (90.3 kg)      Physical Exam Constitutional:      General: She is not in acute distress.    Appearance: Normal appearance.  HENT:     Head: Normocephalic and atraumatic.     Right Ear: Hearing, ear canal and external ear normal. No middle ear effusion. Tympanic membrane is not erythematous.     Left Ear: Hearing, ear canal and external ear normal.  No middle ear effusion. Tympanic membrane is not  erythematous.     Ears:     Comments: Bilateral TMs full.    Nose:     Right Sinus: Maxillary sinus tenderness present.     Left Sinus: Maxillary sinus tenderness present.     Mouth/Throat:     Mouth: Mucous membranes are moist.     Pharynx: Oropharynx is clear.     Tonsils: No tonsillar exudate.  Cardiovascular:     Rate and Rhythm: Normal rate and regular rhythm.     Heart sounds: Normal heart sounds. No murmur heard.    No gallop.  Pulmonary:  Effort: Pulmonary effort is normal. No respiratory distress.     Breath sounds: Normal breath sounds. No wheezing, rhonchi or rales.  Skin:    General: Skin is warm and dry.  Neurological:     Mental Status: She is alert and oriented to person, place, and time.        01/24/2023    4:08 PM 01/12/2022   11:54 AM 07/10/2021    3:28 PM  Depression screen PHQ 2/9  Decreased Interest 0 0 0  Down, Depressed, Hopeless 0 0 0  PHQ - 2 Score 0 0 0  Altered sleeping 0    Tired, decreased energy 0    Change in appetite 0    Feeling bad or failure about yourself  0    Trouble concentrating 0    Moving slowly or fidgety/restless 0    Suicidal thoughts 0    PHQ-9 Score 0    Difficult doing work/chores Not difficult at all        01/24/2023    4:09 PM 08/10/2020    3:58 PM 02/04/2019    6:12 PM  GAD 7 : Generalized Anxiety Score  Nervous, Anxious, on Edge 0 2 1  Control/stop worrying 0 1 3  Worry too much - different things 0 1 3  Trouble relaxing 0 0 2  Restless  0 0  Easily annoyed or irritable 0 3 3  Afraid - awful might happen 0 0 3  Total GAD 7 Score  7 15  Anxiety Difficulty Not difficult at all Somewhat difficult      No results found for any visits on 10/16/23.    Assessment & Plan:   Acute non-recurrent maxillary sinusitis -     Amoxicillin -Pot Clavulanate; Take 6.3 mLs (504 mg total) by mouth 2 (two) times daily.  Dispense: 100 mL; Refill: 0   Acute sinusitis.  Start ABX.  Patient requesting liquid medication.   Continue supportive care with OTC medications.  Consider nasal spray.  Return if symptoms worsen or fail to improve.  Schedule CPE at your convenience.  Viola Greulich, MD

## 2023-10-23 ENCOUNTER — Other Ambulatory Visit: Payer: Self-pay | Admitting: Family Medicine

## 2023-10-29 ENCOUNTER — Encounter: Payer: Self-pay | Admitting: Oncology

## 2023-10-30 ENCOUNTER — Telehealth (INDEPENDENT_AMBULATORY_CARE_PROVIDER_SITE_OTHER): Payer: Self-pay | Admitting: Otolaryngology

## 2023-10-30 NOTE — Telephone Encounter (Signed)
 LVM to confirm appt & location [id number]

## 2023-10-31 ENCOUNTER — Ambulatory Visit (INDEPENDENT_AMBULATORY_CARE_PROVIDER_SITE_OTHER): Admitting: Family Medicine

## 2023-10-31 ENCOUNTER — Encounter (INDEPENDENT_AMBULATORY_CARE_PROVIDER_SITE_OTHER): Payer: Self-pay | Admitting: Otolaryngology

## 2023-10-31 ENCOUNTER — Encounter: Payer: Self-pay | Admitting: Family Medicine

## 2023-10-31 ENCOUNTER — Ambulatory Visit (INDEPENDENT_AMBULATORY_CARE_PROVIDER_SITE_OTHER): Admitting: Otolaryngology

## 2023-10-31 VITALS — BP 139/83 | HR 84

## 2023-10-31 VITALS — BP 128/76 | HR 81 | Temp 98.0°F | Ht 62.5 in | Wt 205.8 lb

## 2023-10-31 DIAGNOSIS — I10 Essential (primary) hypertension: Secondary | ICD-10-CM

## 2023-10-31 DIAGNOSIS — R4189 Other symptoms and signs involving cognitive functions and awareness: Secondary | ICD-10-CM | POA: Diagnosis not present

## 2023-10-31 DIAGNOSIS — M26621 Arthralgia of right temporomandibular joint: Secondary | ICD-10-CM

## 2023-10-31 DIAGNOSIS — R0981 Nasal congestion: Secondary | ICD-10-CM

## 2023-10-31 DIAGNOSIS — R0982 Postnasal drip: Secondary | ICD-10-CM

## 2023-10-31 DIAGNOSIS — J3800 Paralysis of vocal cords and larynx, unspecified: Secondary | ICD-10-CM

## 2023-10-31 DIAGNOSIS — E119 Type 2 diabetes mellitus without complications: Secondary | ICD-10-CM | POA: Diagnosis not present

## 2023-10-31 DIAGNOSIS — L601 Onycholysis: Secondary | ICD-10-CM

## 2023-10-31 DIAGNOSIS — L603 Nail dystrophy: Secondary | ICD-10-CM | POA: Diagnosis not present

## 2023-10-31 DIAGNOSIS — J343 Hypertrophy of nasal turbinates: Secondary | ICD-10-CM | POA: Diagnosis not present

## 2023-10-31 DIAGNOSIS — Z1159 Encounter for screening for other viral diseases: Secondary | ICD-10-CM

## 2023-10-31 DIAGNOSIS — G4733 Obstructive sleep apnea (adult) (pediatric): Secondary | ICD-10-CM

## 2023-10-31 DIAGNOSIS — Z0000 Encounter for general adult medical examination without abnormal findings: Secondary | ICD-10-CM

## 2023-10-31 DIAGNOSIS — J342 Deviated nasal septum: Secondary | ICD-10-CM

## 2023-10-31 DIAGNOSIS — R1310 Dysphagia, unspecified: Secondary | ICD-10-CM

## 2023-10-31 DIAGNOSIS — R490 Dysphonia: Secondary | ICD-10-CM | POA: Diagnosis not present

## 2023-10-31 DIAGNOSIS — J3089 Other allergic rhinitis: Secondary | ICD-10-CM | POA: Diagnosis not present

## 2023-10-31 DIAGNOSIS — K219 Gastro-esophageal reflux disease without esophagitis: Secondary | ICD-10-CM | POA: Diagnosis not present

## 2023-10-31 LAB — COMPREHENSIVE METABOLIC PANEL WITH GFR
ALT: 17 U/L (ref 0–35)
AST: 21 U/L (ref 0–37)
Albumin: 4.2 g/dL (ref 3.5–5.2)
Alkaline Phosphatase: 62 U/L (ref 39–117)
BUN: 13 mg/dL (ref 6–23)
CO2: 28 meq/L (ref 19–32)
Calcium: 8.9 mg/dL (ref 8.4–10.5)
Chloride: 101 meq/L (ref 96–112)
Creatinine, Ser: 0.8 mg/dL (ref 0.40–1.20)
GFR: 80.38 mL/min (ref >60.00)
Glucose, Bld: 88 mg/dL (ref 70–99)
Potassium: 4 meq/L (ref 3.5–5.1)
Sodium: 137 meq/L (ref 135–145)
Total Bilirubin: 0.4 mg/dL (ref 0.2–1.2)
Total Protein: 7.6 g/dL (ref 6.0–8.3)

## 2023-10-31 LAB — CBC WITH DIFFERENTIAL/PLATELET
Basophils Absolute: 0 10*3/uL (ref 0.0–0.1)
Basophils Relative: 0.2 % (ref 0.0–3.0)
Eosinophils Absolute: 0 10*3/uL (ref 0.0–0.7)
Eosinophils Relative: 1.5 % (ref 0.0–5.0)
HCT: 36.2 % (ref 36.0–46.0)
Hemoglobin: 11.9 g/dL — ABNORMAL LOW (ref 12.0–15.0)
Lymphocytes Relative: 39.8 % (ref 12.0–46.0)
Lymphs Abs: 1.3 10*3/uL (ref 0.7–4.0)
MCHC: 32.8 g/dL (ref 30.0–36.0)
MCV: 80 fl (ref 78.0–100.0)
Monocytes Absolute: 0.3 10*3/uL (ref 0.1–1.0)
Monocytes Relative: 8.9 % (ref 3.0–12.0)
Neutro Abs: 1.6 10*3/uL (ref 1.4–7.7)
Neutrophils Relative %: 49.6 % (ref 43.0–77.0)
Platelets: 246 10*3/uL (ref 150.0–400.0)
RBC: 4.52 Mil/uL (ref 3.87–5.11)
RDW: 14 % (ref 11.5–15.5)
WBC: 3.3 10*3/uL — ABNORMAL LOW (ref 4.0–10.5)

## 2023-10-31 LAB — LIPID PANEL
Cholesterol: 243 mg/dL — ABNORMAL HIGH (ref 0–200)
HDL: 45.5 mg/dL (ref >39.00)
LDL Cholesterol: 177 mg/dL — ABNORMAL HIGH (ref 0–99)
NonHDL: 197.69
Total CHOL/HDL Ratio: 5
Triglycerides: 102 mg/dL (ref 0.0–149.0)
VLDL: 20.4 mg/dL (ref 0.0–40.0)

## 2023-10-31 LAB — MICROALBUMIN / CREATININE URINE RATIO
Creatinine,U: 62.5 mg/dL
Microalb Creat Ratio: UNDETERMINED mg/g (ref 0.0–30.0)
Microalb, Ur: <0.7 mg/dL

## 2023-10-31 LAB — VITAMIN D 25 HYDROXY (VIT D DEFICIENCY, FRACTURES): VITD: 19.64 ng/mL — ABNORMAL LOW (ref 30.00–100.00)

## 2023-10-31 LAB — HEMOGLOBIN A1C: Hgb A1c MFr Bld: 6.4 % (ref 4.6–6.5)

## 2023-10-31 LAB — TSH: TSH: 0.88 u[IU]/mL (ref 0.35–5.50)

## 2023-10-31 LAB — VITAMIN B12: Vitamin B-12: 802 pg/mL (ref 211–911)

## 2023-10-31 LAB — T4, FREE: Free T4: 1.11 ng/dL (ref 0.60–1.60)

## 2023-10-31 MED ORDER — LEVOCETIRIZINE DIHYDROCHLORIDE 5 MG PO TABS: 5.0000 mg | ORAL_TABLET | Freq: Every evening | ORAL | 3 refills | Status: DC

## 2023-10-31 MED ORDER — IPRATROPIUM BROMIDE 0.03 % NA SOLN: 2.0000 | Freq: Two times a day (BID) | NASAL | 12 refills | Status: DC

## 2023-10-31 MED ORDER — AZELASTINE HCL 0.1 % NA SOLN: 2.0000 | Freq: Two times a day (BID) | NASAL | 12 refills | Status: DC

## 2023-10-31 NOTE — Progress Notes (Signed)
 Established Patient Office Visit   Subjective  Patient ID: Marilyn [name], female    DOB: 1965-07-17  Age: 58 y.o. MRN: [id number]  Chief Complaint  Patient presents with   Annual Exam    Patient is a 58 year old female seen for CPE.  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  Patient looking forward to getting her health back on track.  Patient endorses brain fog.  Forgetting little things that she is normally on top of.  Patient denies current menopause symptoms.  May have had hot flashes brief in early 20s.  Patient had recent follow-up with ENT.  Advised she was fully diagnosed with vocal cord paralysis.  Patient was unaware.  Patient given 2 newto try for chronic sinusitis symptoms.  Also advised to see allergist.  Patient also following with cardiology.  Has not noticed any palpitations.    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   Hypoxemia associated with sleep 02/23/2022   Pericardial effusion 02/07/2022   Aortic atherosclerosis (HCC) 02/07/2022   Essential hypertension 07/26/2021   Hyperlipidemia associated with type 2 diabetes mellitus (HCC) 08/04/2020   Diet-controlled type 2 diabetes mellitus (HCC) 07/20/2019   Swollen neck 04/09/2018   Urinary retention 08/05/2017   Atypical chest pain 01/23/2017   Nausea & vomiting 07/16/2015   RUQ abdominal pain 07/16/2015   RLQ abdominal pain 07/15/2015   Pain in lower back 11/30/2014   Acute upper respiratory infection 05/05/2014   Allergic rhinitis 05/05/2014   Hoarseness 05/05/2014   Dysphagia 05/05/2014   Obesity 04/11/2014   Edema, peripheral 04/11/2014   Cough 12/20/2012   Hypersomnolence 12/15/2012   Exertional dyspnea 12/15/2012   Right shoulder pain 12/15/2012   Abdominal pain 11/15/2011   Diabetes mellitus with coincident hypertension (HCC) 09/09/2011   Neck pain 09/09/2011    Preventative health care 04/30/2011   HOT FLASHES 12/22/2009   CHEST PAIN 01/29/2009   Acute non-recurrent maxillary sinusitis 09/25/2008   HYPERTHYROIDISM 01/31/2007   GERD 01/31/2007   Hypothyroidism 01/30/2007   Hyperlipidemia 01/30/2007   Iron deficiency anemia 01/30/2007   ASTHMA 01/30/2007   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   Aortic atherosclerosis (HCC) 02/07/2022   Arthritis    ASTHMA 01/30/2007   Asthma    Breast tumor    right; benign   Chest pain 01/24/2017   CHEST PAIN 01/29/2009   Qualifier: Diagnosis of  By: Joelle Musca MD, Oddis Bench    Essential hypertension 07/26/2021   GERD 01/31/2007   Hemorrhoids    Hiatal hernia    Hyperlipidemia    Hypersomnolence 12/15/2012   HYPOTHYROIDISM 01/30/2007   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   Neck pain 09/09/2011   Obesity 04/11/2014   Pericardial effusion 02/07/2022   Sleep apnea    Past Surgical History:  Procedure Laterality Date   BREAST BIOPSY Right 12/2014   BREAST EXCISIONAL BIOPSY Right 2016   BREAST LUMPECTOMY WITH RADIOACTIVE SEED LOCALIZATION Right 02/25/2015   Procedure: BREAST LUMPECTOMY WITH RADIOACTIVE SEED LOCALIZATION;  Surgeon: Juanita Norlander, MD;  Location: [facility];  Service: General;  Laterality: Right;   BREAST REDUCTION SURGERY  1986   BREAST SURGERY     broken jaw     CARPAL TUNNEL RELEASE  2001   COLONOSCOPY     FRACTURE SURGERY     REDUCTION MAMMAPLASTY Bilateral  THYROIDECTOMY     TUBAL LIGATION  1987   UPPER GASTROINTESTINAL ENDOSCOPY     Social History   Tobacco Use   Smoking status: Former    Current packs/day: 0.00    Average packs/day: 0.3 packs/day for 3.0 years (0.8 ttl pk-yrs)    Types: Cigarettes    Start date: 06/04/1977    Quit date: 06/04/1980    Years since quitting: 43.4   Smokeless tobacco: Never  Vaping Use   Vaping status: Never Used  Substance Use Topics   Alcohol  use: No    Alcohol /week: 0.0  standard drinks of alcohol    Drug use: No   Family History  Problem Relation Age of Onset   Asthma Mother    Hypertension Father    Diabetes Father    Hyperlipidemia Father    Breast cancer Sister 60   Thyroid  disease Sister    Migraines Daughter    Breast cancer Maternal Aunt    Diabetes Maternal Grandmother    Heart disease Maternal Grandmother    Heart attack Maternal Grandmother 60   Stroke Maternal Grandmother    Asthma Maternal Grandfather    Heart attack Maternal Grandfather    Stroke Maternal Grandfather    Colon cancer Brother    Esophageal cancer Neg Hx    Stomach cancer Neg Hx    Rectal cancer Neg Hx    Allergies  Allergen Reactions   Promethazine -Dm    Tuna [Fish Allergy] Anaphylaxis   Codeine Nausea And Vomiting   Fluticasone  Propionate Other (See Comments)   Promethazine  Other (See Comments)   Atorvastatin      Muscle aches    Flonase  [Fluticasone  Propionate] Other (See Comments)    Nose bleeds    ROS Negative unless stated above    Objective:      BP 128/76 (BP Location: Left Arm, Patient Position: Sitting, Cuff Size: Normal)   Pulse 81   Temp 98 F (36.7 C) (Oral)   Ht 5' 2.5" (1.588 m)   Wt 205 lb 12.8 oz (93.4 kg)   LMP 02/21/2013   SpO2 94%   BMI 37.04 kg/m  BP Readings from Last 3 Encounters:  10/31/23 128/76  10/31/23 139/83  10/16/23 124/72   Wt Readings from Last 3 Encounters:  10/31/23 205 lb 12.8 oz (93.4 kg)  10/16/23 205 lb 3.2 oz (93.1 kg)  09/24/23 195 lb 9.6 oz (88.7 kg)      Physical Exam Constitutional:      Appearance: Normal appearance.  HENT:     Head: Normocephalic and atraumatic.     Right Ear: Tympanic membrane, ear canal and external ear normal.     Left Ear: Tympanic membrane, ear canal and external ear normal.     Nose: Nose normal.     Mouth/Throat:     Mouth: Mucous membranes are moist.     Pharynx: No oropharyngeal exudate or posterior oropharyngeal erythema.  Eyes:     General: No scleral  icterus.    Extraocular Movements: Extraocular movements intact.     Conjunctiva/sclera: Conjunctivae normal.     Pupils: Pupils are equal, round, and reactive to light.  Neck:     Thyroid : No thyromegaly.  Cardiovascular:     Rate and Rhythm: Normal rate and regular rhythm.     Pulses: Normal pulses.     Heart sounds: Normal heart sounds. No murmur heard.    No friction rub.  Pulmonary:     Effort: Pulmonary effort is normal.  Breath sounds: Normal breath sounds. No wheezing, rhonchi or rales.  Abdominal:     General: Bowel sounds are normal.     Palpations: Abdomen is soft.     Tenderness: There is no abdominal tenderness.  Musculoskeletal:        General: No deformity. Normal range of motion.  Lymphadenopathy:     Cervical: No cervical adenopathy.  Skin:    General: Skin is warm and dry.     Findings: No lesion.  Neurological:     General: No focal deficit present.     Mental Status: She is alert and oriented to person, place, and time.  Psychiatric:        Mood and Affect: Mood normal.        Thought Content: Thought content normal.        01/24/2023    4:08 PM 01/12/2022   11:54 AM 07/10/2021    3:28 PM  Depression screen PHQ 2/9  Decreased Interest 0 0 0  Down, Depressed, Hopeless 0 0 0  PHQ - 2 Score 0 0 0  Altered sleeping 0    Tired, decreased energy 0    Change in appetite 0    Feeling bad or failure about yourself  0    Trouble concentrating 0    Moving slowly or fidgety/restless 0    Suicidal thoughts 0    PHQ-9 Score 0    Difficult doing work/chores Not difficult at all        01/24/2023    4:09 PM 08/10/2020    3:58 PM 02/04/2019    6:12 PM  GAD 7 : Generalized Anxiety Score  Nervous, Anxious, on Edge 0 2 1  Control/stop worrying 0 1 3  Worry too much - different things 0 1 3  Trouble relaxing 0 0 2  Restless  0 0  Easily annoyed or irritable 0 3 3  Afraid - awful might happen 0 0 3  Total GAD 7 Score  7 15  Anxiety Difficulty Not difficult  at all Somewhat difficult    Diabetic Foot Exam - Simple   Simple Foot Form Diabetic Foot exam was performed with the following findings: Yes 10/31/2023  1:47 PM  Visual Inspection No deformities, no ulcerations, no other skin breakdown bilaterally: Yes Sensation Testing Intact to touch and monofilament testing bilaterally: Yes Pulse Check Posterior Tibialis and Dorsalis pulse intact bilaterally: Yes Comments hyperpigmented hypertrophic toenails on b/l feet.      No results found for any visits on 10/31/23.    Assessment & Plan:   Well adult exam -     CBC with Differential/Platelet; Future -     Comprehensive metabolic panel with GFR; Future -     Hemoglobin A1c; Future -     Lipid panel; Future  Essential hypertension -     Comprehensive metabolic panel with GFR; Future -     Lipid panel; Future -     T4, free; Future -     TSH; Future  Diet-controlled diabetes mellitus (HCC) -     Hemoglobin A1c; Future -     Lipid panel; Future -     Microalbumin / creatinine urine ratio  Encounter for hepatitis C screening test for low risk patient -     Hepatitis C antibody; Future  Age-appropriate health screenings discussed.  Obtain labs.  Mammogram up-to-date done 09/12/2023.  Pap scheduled for next month.  Colonoscopy done 08/23/2022.  Bone density not yet indicated.  Foot exam  done this visit.  Diabetes diet controlled.  Discussed importance of continuing lifestyle modifications.  Last hemoglobin A1c was 6.4%.  Blood pressure looks great.  Continue Toprol -XL 25 mg daily and Norvasc  10 mg daily.  No follow-ups on file.   Viola Greulich, MD

## 2023-10-31 NOTE — Patient Instructions (Addendum)
 [employer] - check out this website to learn more about reflux   -Avoid lying down for at least two hours after a meal or after drinking acidic beverages, like soda, or other caffeinated beverages. This can help to prevent stomach contents from flowing back into the esophagus. -Keep your head elevated while you sleep. Using an extra pillow or two can also help to prevent reflux. -Eat smaller and more frequent meals each day instead of a few large meals. This promotes digestion and can aid in preventing heartburn. -Wear loose-fitting clothes to ease pressure on the stomach, which can worsen heartburn and reflux. -Reduce excess weight around the midsection. This can ease pressure on the stomach. Such pressure can force some stomach contents back up the esophagus - Take Reflux Gourmet (natural supplement available on [employer]) to help with symptoms of chronic throat irritation     See information about Eustachian Tube Dysfunction below:    Overview The eustachian (say "you-STAY-shee-un") tubes connect the middle ear on each side to the back of the throat. They keep air pressure stable in the ears. If your eustachian tubes become blocked, the air pressure in your ears changes. A quick change in air pressure can cause eustachian tubes to close up. This might happen when an airplane changes altitude or when a scuba diver goes up or down underwater. And a cold can make the tubes swell and block the fluid in the middle ear from draining out. That can cause pain.  Eustachian tube problems often clear up on their own or after treating the cause of the blockage. If your tubes continue to be blocked, you may need surgery.  Follow-up care is a key part of your treatment and safety. Be sure to make and go to all appointments, and call your doctor or nurse advice line (811 in most provinces and territories) if you are having problems. It's also a good idea to know your test results and keep a list of  the medicines you take.  How can you care for yourself at home? Try a simple exercise to help open blocked tubes. Close your mouth, hold your nose, and gently blow as if you are blowing your nose. Yawning and chewing gum also may help. You may hear or feel a "pop" when the tubes open. To ease ear pain, apply a warm face cloth or a heating pad set on low. There may be some drainage from the ear when the heat melts earwax. Put a cloth between the heat source and your skin. If your doctor prescribed antibiotics, take them as directed. Do not stop taking them just because you feel better. You need to take the full course of antibiotics. Be safe with medicines. Depending on the cause of the problem, your doctor may recommend over-the-counter medicine. For example, adults may try decongestants for cold symptoms or nasal spray steroids for allergies. Follow the instructions carefully.

## 2023-10-31 NOTE — Progress Notes (Signed)
 ENT CONSULT:  Reason for Consult: dysphonia  dysphagia hx of paralysis of the vocal fold and chronic nasal congestion ear pressure   HPI: Discussed the use of AI scribe software for clinical note transcription with the patient, who gave verbal consent to proceed.  History of Present Illness Marilyn [name] is a 58 year old female with hx of thyroidectomy at the age of 28 reportedly for goiter, hx of vocal fold paralysis, dysphagia, hx of right mandibular fx sx repair several years ago, here for dysphonia, dysphagia and choking episodes, as well as sensation of pressure and fluid in her right ear.   She experiences voice changes that occur when exposed to strong smells or when choking. These symptoms have been present intermittently for a while. She recalls a previous doctor mentioning a possible paralysis on one side of her vocal cords, although this was not communicated to her directly at the time.  She has a history of thyroid  surgery at the age of [age] due to a large goiter. Most of the thyroid  was removed, and she has been on Synthroid  since then. However, she has been told that there might still be some thyroid  tissue present.  She experiences a sensation of swelling in her face and pressure in her ears, particularly on the right side. She describes the ear pressure as feeling like a nail in her ear or as if fluid is running and dripping, although nothing is visible. These symptoms have been more noticeable in the past five years. Had mandible fx on the right side.   She has a history of a broken jaw at the age of 1 or 66, which was repaired, but she does not recall the details of the treatment. She has noticed swelling and pressure in her ear.  She has been prescribed CPAP for sleep apnea but is not consistently using it due to fear of sinus infections. She uses an albuterol  inhaler and was recently given another inhaler in the emergency room, which she cannot recall  the name of. She is allergic to Flonase  and is not currently taking any allergy pills.  She takes Prilosec 40 mg for reflux. She reports occasional trouble with swallowing, coughing, or choking when eating or drinking, which occurs unpredictably. She has undergone swallow studies in the past, but details are unclear.    Records Reviewed:  Pulm Office visit 08/27/23 58 year old female, former smoker quit in 1982. PMH significant for aortic atherosclerosis, diabetes, HTN, asthma, allergic rhinitis, GERD, hyperthyroidism, obesity.  Marilyn [name] is a 58 year old female with sleep apnea who presents for a sleep consult.   She has a history of sleep apnea diagnosed in July 2023 following a sleep study and CPAP titration study. She was prescribed a CPAP machine, which she uses intermittently due to concerns about a recall she read about online, although her machine, a ResMed, was not affected. She experiences sinus infections, epistaxis, and fluid in her ears, which she associates with CPAP use. She also reports dizziness, swelling on the left side of her face, and chronic congestion. She describes a sensation of choking and difficulty breathing, particularly when using the CPAP for extended periods.   She has a history of thyroid  removal at age 32 and reports changes in her voice, which her son has also noticed. She experiences shortness of breath when talking and has a history of asthma, for which she uses albuterol  as needed, approximately once or twice a year. She has not  undergone a full breathing test. She has a history of vocal cord issues, with a past diagnosis of one-sided vocal cord paralysis. She has seen a speech therapist for evaluation but not for therapy. She reports hearing difficulties and pressure in her ears, which affects her hearing.   She takes pantoprazole  40 mg twice daily for reflux, which was recently changed due to stomach soreness. She experiences occasional stomach pain,  which she associates with certain foods.   She reports waking up two to three times a night to urinate and occasional insomnia due to stress. She feels she sleeps well unless stressed and tries to maintain a regular sleep schedule  Obstructive Sleep Apnea Sleep study in July 2024 showed moderate to severe obstructive sleep apnea with 28.5 apneic events per hour and oxygen desaturation to 75%. Stressed the importance of CPAP therapy to reduce cardiovascular and pulmonary risks. No recalls for ResMed machines. Inspire device is an alternative if CPAP is not tolerated. - Adjust CPAP pressure settings to auto (5-10 cm H2O). - Increase humidification level on CPAP. - Replace CPAP supplies including tubing, mask, and filter. - Encourage consistent CPAP use nightly - Discuss potential Inspire device with ENT if CPAP is not tolerated.   Chronic Sinusitis Chronic sinus congestion with possible laryngeal pharyngeal reflux. ENT evaluation needed to assess symptoms  - Refer to ENT (Dr. Larkin Plumb) for evaluation of chronic sinusitis and vocal hoarseness. - Adjust CPAP humidification to alleviate nasal dryness and epistaxis.   Vocal Cord Paralysis One-sided vocal cord paralysis with voice changes and hoarseness. ENT evaluation required for extent and intervention. - Refer to ENT (Dr. Larkin Plumb) for further evaluation and possible laryngoscopy. - Consider referral to speech therapy for voice and breathing exercises.    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   Aortic atherosclerosis (HCC) 02/07/2022   Arthritis    ASTHMA 01/30/2007   Asthma    Breast tumor    right; benign   Chest pain 01/24/2017   CHEST PAIN 01/29/2009   Qualifier: Diagnosis of  By: Joelle Musca MD, Oddis Bench    Essential hypertension 07/26/2021   GERD 01/31/2007   Hemorrhoids    Hiatal hernia    Hyperlipidemia    Hypersomnolence 12/15/2012   HYPOTHYROIDISM 01/30/2007   Mallory -  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/2003   Neck pain 09/09/2011   Obesity 04/11/2014   Pericardial effusion 02/07/2022   Sleep apnea     Past Surgical History:  Procedure Laterality Date   BREAST BIOPSY Right 12/2014   BREAST EXCISIONAL BIOPSY Right 2016   BREAST LUMPECTOMY WITH RADIOACTIVE SEED LOCALIZATION Right 02/25/2015   Procedure: BREAST LUMPECTOMY WITH RADIOACTIVE SEED LOCALIZATION;  Surgeon: Juanita Norlander, MD;  Location: [facility];  Service: General;  Laterality: Right;   BREAST REDUCTION SURGERY  1986   BREAST SURGERY     broken jaw     CARPAL TUNNEL RELEASE  2001   COLONOSCOPY     FRACTURE SURGERY     REDUCTION MAMMAPLASTY Bilateral    THYROIDECTOMY     TUBAL LIGATION  1987   UPPER GASTROINTESTINAL ENDOSCOPY      Family History  Problem Relation Age of Onset   Asthma Mother    Hypertension Father    Diabetes Father    Hyperlipidemia Father    Breast cancer Sister 1   Thyroid  disease Sister    Migraines Daughter    Breast cancer Maternal Aunt    Diabetes Maternal  Grandmother    Heart disease Maternal Grandmother    Heart attack Maternal Grandmother 60   Stroke Maternal Grandmother    Asthma Maternal Grandfather    Heart attack Maternal Grandfather    Stroke Maternal Grandfather    Colon cancer Brother    Esophageal cancer Neg Hx    Stomach cancer Neg Hx    Rectal cancer Neg Hx     Social History:  reports that she quit smoking about 43 years ago. Her smoking use included cigarettes. She started smoking about 46 years ago. She has a 0.8 pack-year smoking history. She has never used smokeless tobacco. She reports that she does not drink alcohol  and does not use drugs.  Allergies:  Allergies  Allergen Reactions   Promethazine -Dm    Tuna [Fish Allergy] Anaphylaxis   Codeine Nausea And Vomiting   Fluticasone  Propionate Other (See Comments)   Promethazine  Other (See Comments)   Atorvastatin      Muscle aches    Flonase  [Fluticasone  Propionate] Other (See  Comments)    Nose bleeds    Medications: I have reviewed the patient's current medications.  The PMH, PSH, Medications, Allergies, and SH were reviewed and updated.  ROS: Constitutional: Negative for fever, weight loss and weight gain. Cardiovascular: Negative for chest pain and dyspnea on exertion. Respiratory: Is not experiencing shortness of breath at rest. Gastrointestinal: Negative for nausea and vomiting. Neurological: Negative for headaches. Psychiatric: The patient is not nervous/anxious  Blood pressure 139/83, pulse 84, last menstrual period 02/21/2013, SpO2 91%. There is no height or weight on file to calculate BMI.  PHYSICAL EXAM:  Exam: General: Well-developed, well-nourished Communication and Voice: Clear pitch and clarity Respiratory Respiratory effort: Equal inspiration and expiration without stridor Cardiovascular Peripheral Vascular: Warm extremities with equal color/perfusion Eyes: No nystagmus with equal extraocular motion bilaterally Neuro/Psych/Balance: Patient oriented to person, place, and time; Appropriate mood and affect; Gait is intact with no imbalance; Cranial nerves I-XII are intact Head and Face Inspection: Normocephalic and atraumatic without mass or lesion Palpation: Facial skeleton intact without bony stepoffs Salivary Glands: No mass or tenderness Facial Strength: Facial motility symmetric and full bilaterally ENT Pinna: External ear intact and fully developed External canal: Canal is patent with intact skin Tympanic Membrane: Clear and mobile External Nose: No scar or anatomic deformity Internal Nose: Septum is S-shaped and deviated more to the right side. No polyp, or purulence. Mucosal edema and erythema present.  Bilateral inferior turbinate hypertrophy.  Lips, Teeth, and gums: Mucosa and teeth intact and viable TMJ: No pain to palpation with full mobility Oral cavity/oropharynx: No erythema or exudate, no lesions present Nasopharynx:  No mass or lesion with intact mucosa Hypopharynx: Intact mucosa without pooling of secretions Larynx Glottic: Full true vocal cord mobility on the left and evidence of R VF paralysis without lesion or mass Supraglottic: Normal appearing epiglottis and AE folds Interarytenoid Space: Moderate pachydermia&edema Subglottic Space: Patent without lesion or edema Neck Neck and Trachea: Midline trachea without mass or lesion Thyroid : No mass or nodularity Lymphatics: No lymphadenopathy  Procedure: Preoperative diagnosis: dysphonia hx of VF paralysis   Postoperative diagnosis:   Same + R VF paralysis GERD LPR  Procedure: Flexible fiberoptic laryngoscopy  Surgeon: Artice Last, MD  Anesthesia: Topical lidocaine  and Afrin Complications: None Condition is stable throughout exam  Indications and consent:  The patient presents to the clinic with above symptoms. Indirect laryngoscopy view was incomplete. Thus it was recommended that they undergo a flexible fiberoptic laryngoscopy. All of the risks,  benefits, and potential complications were reviewed with the patient preoperatively and verbal informed consent was obtained.  Procedure: The patient was seated upright in the clinic. Topical lidocaine  and Afrin were applied to the nasal cavity. After adequate anesthesia had occurred, I then proceeded to pass the flexible telescope into the nasal cavity. The nasal cavity was patent without rhinorrhea or polyp. The nasopharynx was also patent without mass or lesion. The base of tongue was visualized and was normal. There were no signs of pooling of secretions in the piriform sinuses. The true vocal folds with evidence of R VF paralysis, left side was mobile There were no signs of glottic or supraglottic mucosal lesion or mass. There was moderate interarytenoid pachydermia and post cricoid edema. The telescope was then slowly withdrawn and the patient tolerated the procedure throughout.    PROCEDURE NOTE:  nasal endoscopy  Preoperative diagnosis: chronic sinusitis symptoms  Postoperative diagnosis: same  Procedure: Diagnostic nasal endoscopy (54098)  Surgeon: Artice Last, M.D.  Anesthesia: Topical lidocaine  and Afrin  H&P REVIEW: The patient's history and physical were reviewed today prior to procedure. All medications were reviewed and updated as well. Complications: None Condition is stable throughout exam Indications and consent: The patient presents with symptoms of chronic sinusitis not responding to previous therapies. All the risks, benefits, and potential complications were reviewed with the patient preoperatively and informed consent was obtained. The time out was completed with confirmation of the correct procedure.   Procedure: The patient was seated upright in the clinic. Topical lidocaine  and Afrin were applied to the nasal cavity. After adequate anesthesia had occurred, the rigid nasal endoscope was passed into the nasal cavity. The nasal mucosa, turbinates, septum, and sinus drainage pathways were visualized bilaterally. This revealed no purulence or significant secretions that might be cultured. There were no polyps or sites of significant inflammation. The mucosa was intact and there was no crusting present. The scope was then slowly withdrawn and the patient tolerated the procedure well. There were no complications or blood loss.    Studies Reviewed: 09/10/23 XR neck soft tissue 09/10/23 FINDINGS: Surgical wire at the right mandible. Normal prevertebral soft tissue thickness. Moderate degenerative changes C4 through C7. No radiopaque foreign body is seen. Normal epiglottis.  Esophagram 10/21/2018 IMPRESSION: 1. Small hiatal hernia.  Severe gastroesophageal reflux elicited. 2. 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 3. Mild reflux esophagitis. No evidence of esophageal mass, stricture or ulcer, see  comments. 4. Premature spillage of barium bolus into the vallecula and hypopharynx with every swallow. Otherwise normal oral and pharyngeal phases of swallowing.   IMPRESSION: Negative.  Assessment/Plan: Encounter Diagnoses  Name Primary?   Dysphonia    Hoarseness    Chronic nasal congestion    Environmental and seasonal allergies    Hypertrophy of both inferior nasal turbinates    Nasal septal deviation    Post-nasal drip    Vocal cord paralysis    Arthralgia of right temporomandibular joint    Dysphagia, unspecified type Yes    Assessment and Plan Assessment & Plan Right vocal cord paralysis Dysphonia  Confirmed R VF paralysis on scope exam today, likely related to previous thyroid  surgery. Potential for laryngospasm related to reflux was also discussed due to episodes of occasional choking with foods, but this could also be related to dysphagia or R VF paralysis  - Order swallow study to assess swallowing function and risk of aspiration. MBS esophagram - Discuss management of laryngospasm related to reflux.  Gastroesophageal reflux disease (GERD)mLPR Managed with Prilosec 40 mg. Symptoms include sensation of something stuck in throat and choking, possibly related to reflux-induced laryngospasm vs dysphagia. Discussed use of Reflux Gourmet as a complementary treatment. - Review lifestyle modifications for reflux management. - Recommend Reflux Gourmet supplement to complement medication. - continue PPI  Dysphagia  Last esophagram in 2020 with hiatal hernia, c/o dysphagia sx. Also had GERD and esophageal dysmotility - MBS esophagram - management of GERD   Eustachian tube dysfunction vs TMJ sy Right-sided ear pressure and sensation of fluid, possibly related to eustachian tube dysfunction or TMJ disorder from hx of R mandibular fx. Exam with mild TTP at the R TMJ and mild subluxation no trismus. Ear exam normal with no visible fluid. Management of post nasal drainage may  alleviate symptoms if this is ETD. We also discussed management of TMJ sy - Provide handout on eustachian tube dysfunction. - Initiate management of post nasal drainage nasal congestion with prescribed medications.  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 Presents with swelling and pain around the ear, potentially contributing to ear symptoms. Symptoms may mimic eustachian tube dysfunction. - Advised on softer diet, warm compresses, and use of Tylenol  or Motrin  for pain management.  Chronic nasal congestion Allergic rhinitis Nasal congestion and phlegm. Nasal endoscopy with septal deviation and ITH, but no lesions near eustachian tube or in nasopharynx. Allergic to Flonase . Current allergy management may be inadequate. Discussed potential referral to allergy specialist for further evaluation and possible allergy shots. - Prescribed Xyzal for allergy management. - Prescribe two nasal sprays (Atrovent  and Azelastine ) to be used one in the morning and one at night. - Discussed potential referral to allergy specialist for further evaluation and possible allergy shots.   MBS esophagram and RTC 3 mo   Thank you for allowing me to participate in the care of this patient. Please do not hesitate to contact me with any questions or concerns.   Artice Last, MD Otolaryngology [facility] Phone: [phone] Fax: [phone]    10/31/2023, 10:58 AM

## 2023-11-01 ENCOUNTER — Ambulatory Visit: Payer: Self-pay | Admitting: Family Medicine

## 2023-11-01 ENCOUNTER — Encounter: Admitting: Family Medicine

## 2023-11-01 ENCOUNTER — Other Ambulatory Visit: Payer: Self-pay | Admitting: Family Medicine

## 2023-11-01 DIAGNOSIS — E559 Vitamin D deficiency, unspecified: Secondary | ICD-10-CM

## 2023-11-01 LAB — HEPATITIS C ANTIBODY: Hepatitis C Ab: NONREACTIVE

## 2023-11-01 MED ORDER — VITAMIN D (ERGOCALCIFEROL) 1.25 MG (50000 UNIT) PO CAPS: 50000.0000 [IU] | ORAL_CAPSULE | ORAL | 0 refills | Status: DC

## 2023-11-05 ENCOUNTER — Other Ambulatory Visit (HOSPITAL_COMMUNITY): Payer: Self-pay | Admitting: *Deleted

## 2023-11-05 DIAGNOSIS — R1310 Dysphagia, unspecified: Secondary | ICD-10-CM

## 2023-11-09 ENCOUNTER — Other Ambulatory Visit: Payer: Self-pay | Admitting: Family Medicine

## 2023-11-09 DIAGNOSIS — I10 Essential (primary) hypertension: Secondary | ICD-10-CM

## 2023-11-13 IMAGING — DX DG CHEST 2V
2 series · 2 of 2 positions shown · non-contrast
Comparison: 03/04/2020

CLINICAL DATA: Worsening cough and occasional hemoptysis post
[id number] 05/21/2021

EXAM:
CHEST - 2 VIEW

[chest pa]
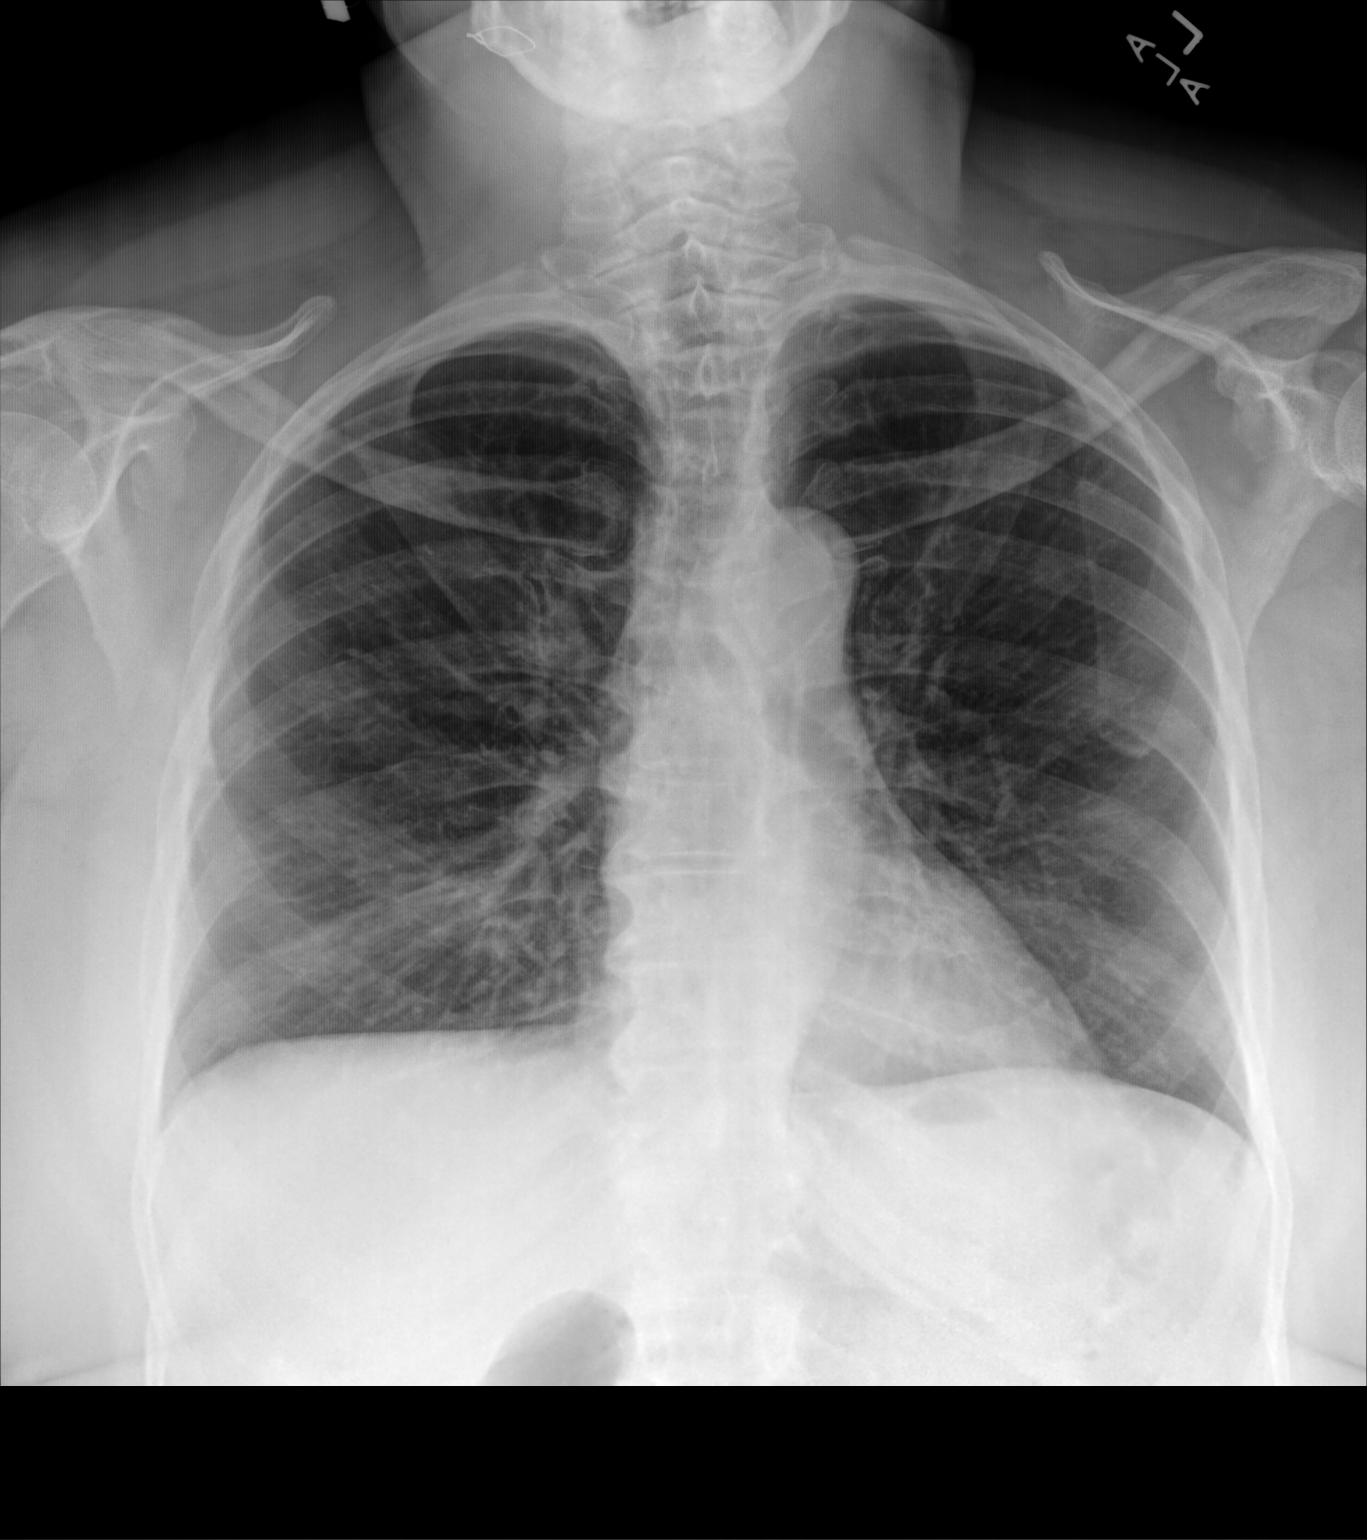

[chest lat]
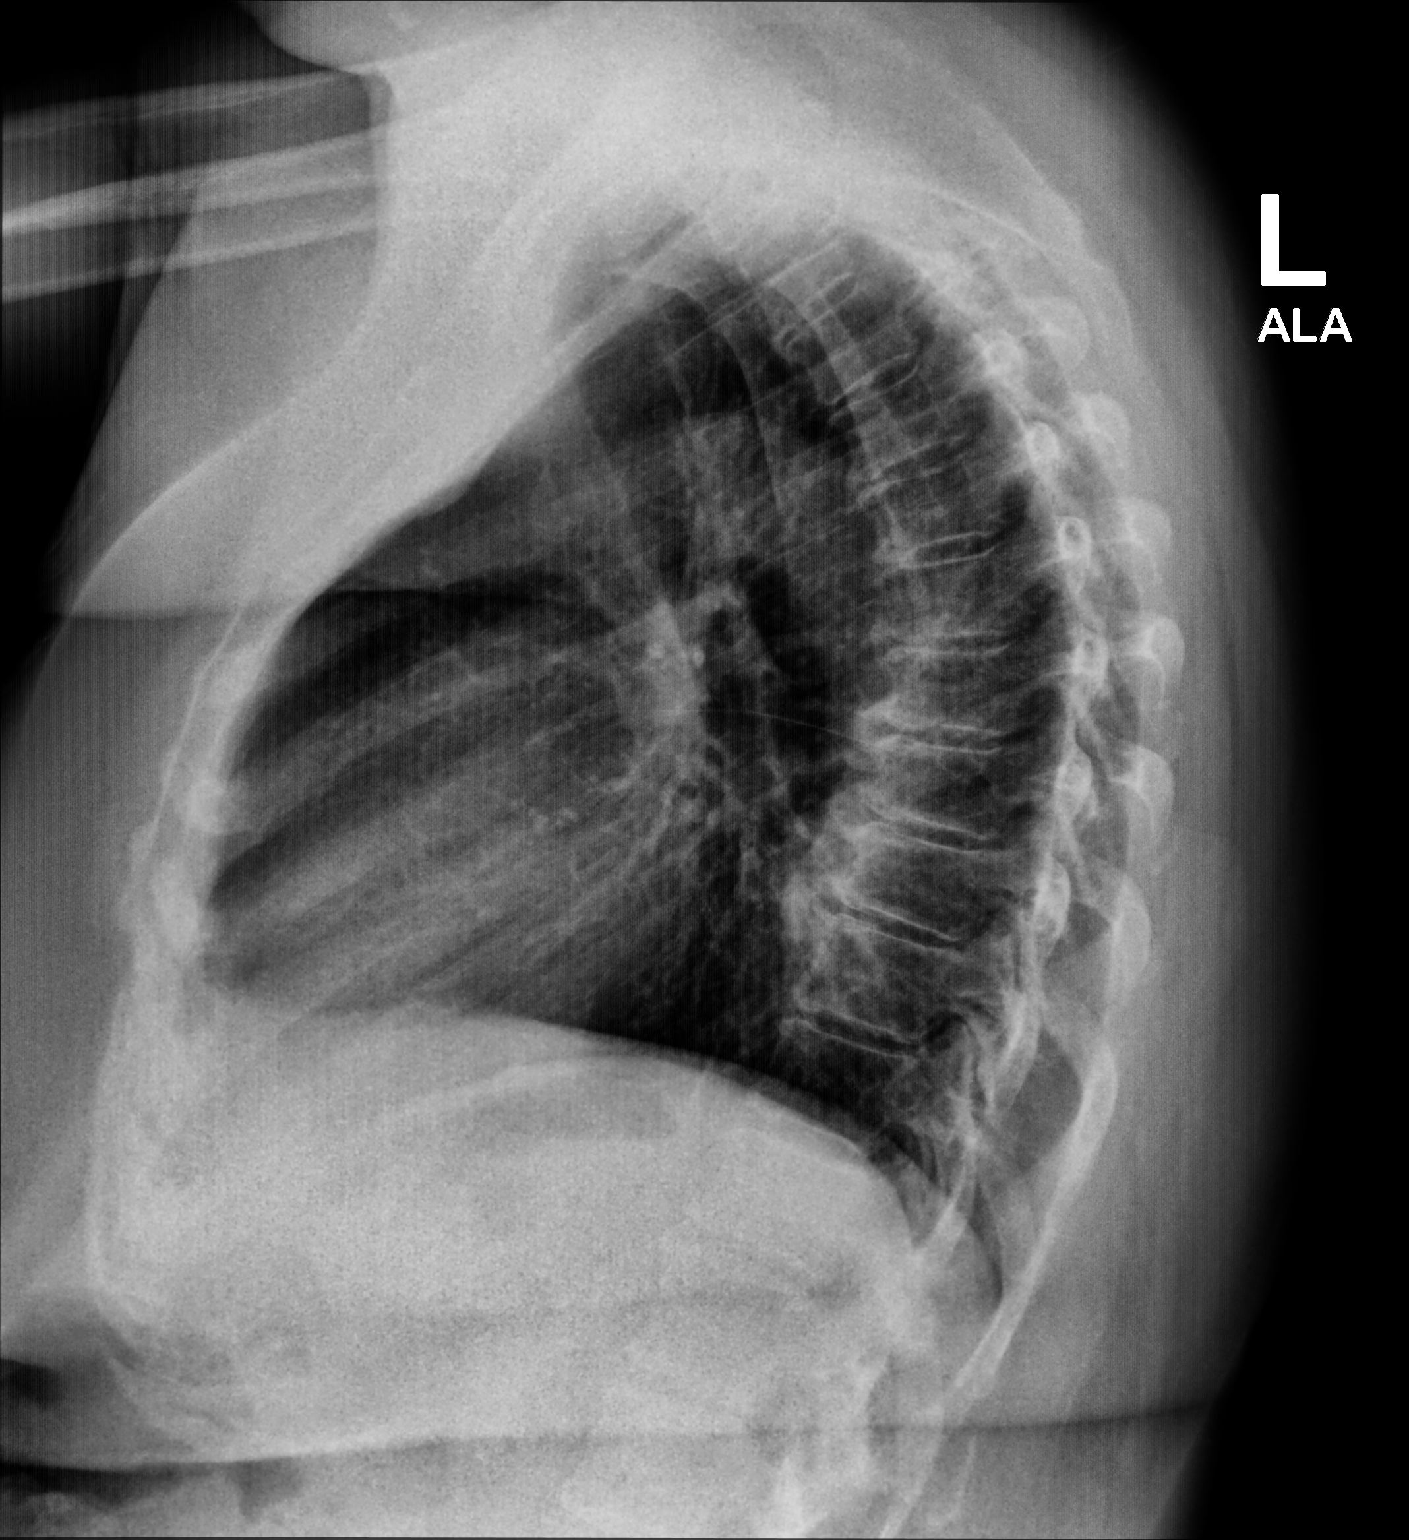

[2 of 2 positions shown; findings below may reference images not displayed]

FINDINGS: Normal heart size, mediastinal contours, and pulmonary vascularity.

Lungs clear.

No pulmonary infiltrate, pleural effusion, or pneumothorax.

Scattered endplate spur formation thoracic spine.
IMPRESSION: No acute abnormalities.

## 2023-11-15 ENCOUNTER — Telehealth: Payer: Self-pay | Admitting: Family Medicine

## 2023-11-15 MED ORDER — ACCU-CHEK GUIDE W/DEVICE KIT: PACK | 0 refills | Status: AC

## 2023-11-15 NOTE — Telephone Encounter (Signed)
 Sent Accu check over to the patient's pharmacy, patient is aware

## 2023-11-15 NOTE — Telephone Encounter (Signed)
 Patient insurance is discontinuing coverage on her glucose monitor and strips.  She needs a new kit and strips sent to the pharmacy.  Information of one covered was handed to [facility].  Pharmacy- [facility] on [address]

## 2023-11-19 ENCOUNTER — Ambulatory Visit (HOSPITAL_COMMUNITY)
Admission: RE | Admit: 2023-11-19 | Discharge: 2023-11-19 | Disposition: A | Discharge status: Home / Self Care | Source: Ambulatory Visit | Attending: Otolaryngology | Admitting: Otolaryngology

## 2023-11-19 ENCOUNTER — Other Ambulatory Visit (INDEPENDENT_AMBULATORY_CARE_PROVIDER_SITE_OTHER): Payer: Self-pay | Admitting: Otolaryngology

## 2023-11-19 ENCOUNTER — Ambulatory Visit (HOSPITAL_COMMUNITY)
Admission: RE | Admit: 2023-11-19 | Discharge: 2023-11-19 | Disposition: A | Discharge status: Home / Self Care | Source: Ambulatory Visit | Attending: Family Medicine | Admitting: Family Medicine

## 2023-11-19 DIAGNOSIS — R1312 Dysphagia, oropharyngeal phase: Secondary | ICD-10-CM | POA: Insufficient documentation

## 2023-11-19 DIAGNOSIS — J343 Hypertrophy of nasal turbinates: Secondary | ICD-10-CM

## 2023-11-19 DIAGNOSIS — R1310 Dysphagia, unspecified: Secondary | ICD-10-CM

## 2023-11-19 DIAGNOSIS — M26621 Arthralgia of right temporomandibular joint: Secondary | ICD-10-CM

## 2023-11-19 DIAGNOSIS — R490 Dysphonia: Secondary | ICD-10-CM

## 2023-11-19 DIAGNOSIS — R0982 Postnasal drip: Secondary | ICD-10-CM

## 2023-11-19 DIAGNOSIS — K449 Diaphragmatic hernia without obstruction or gangrene: Secondary | ICD-10-CM | POA: Diagnosis not present

## 2023-11-19 DIAGNOSIS — J342 Deviated nasal septum: Secondary | ICD-10-CM

## 2023-11-19 DIAGNOSIS — K224 Dyskinesia of esophagus: Secondary | ICD-10-CM | POA: Insufficient documentation

## 2023-11-19 DIAGNOSIS — J3800 Paralysis of vocal cords and larynx, unspecified: Secondary | ICD-10-CM

## 2023-11-19 DIAGNOSIS — J3089 Other allergic rhinitis: Secondary | ICD-10-CM

## 2023-11-19 DIAGNOSIS — R0981 Nasal congestion: Secondary | ICD-10-CM

## 2023-11-19 NOTE — Progress Notes (Signed)
 Modified Barium Swallow Study  Patient Details  Name: Marilyn [name] MRN: [id number] Date of Birth: Dec 12, 1965  Today's Date: 11/19/2023  Modified Barium Swallow completed.  Full report located under Chart Review in the Imaging Section.  History of Present Illness Marilyn [name] is a 58 year old female arriving for an OP MBS, referred by ENT. Visisted ENT due to dysphonia, dysphagia and choking episodes, as well as sensation of pressure and fluid in her right ear.    ENT reports she experiences voice changes that occur when exposed to strong smells or when choking. ENT reports on 10/31/23 Confirmed R VF paralysis on scope exam today, likely related to previous thyroid  surgery. Potential for laryngospasm related to reflux was also discussed due to episodes of occasional choking with foods, but this could also be related to dysphagia or R VF paralysis. Right-sided ear pressure and sensation of fluid, possibly related to eustachian tube dysfunction or TMJ disorder from hx of R mandibular fx. Exam with mild TTP at the R TMJ and mild subluxation no trismus. Esophagram on 10/21/2018 Severe gastroesophageal reflux elicited. 2. 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 3. Mild reflux esophagitis. No evidence of esophageal mass, stricture or ulcer, see comments. 4. Premature spillage of barium bolus into the vallecula and  hypopharynx with every swallow. Otherwise normal oral and pharyngeal phases of swallowing. Pt with hx of thyroidectomy at the age of 25 reportedly for goiter, hx of vocal fold paralysis, dysphagia, hx of right mandibular fx sx repair several years ago,   Clinical Impression Pt demonstrates a mild oropharyngeal dysphagia with no aspiration or significant penetration on study. However, pt was noted to have differences in swallow function that could lead to instances of coughing with PO intake. Pt have prolonged oral  manipulation of solid textures; seems to have some aversion to puree with prolonged lingual rocking and pumping to elicit bolus transit. Also unable to transit pill to pharynx in puree bolus; gagging observed. Liquid textures spill to pyriform sinuses prior to hyoid burst. Otherwise pharyngeal strength and laryngeal closure during the swallow all appear adequate. Primary concern is sensory change and potential hypersensitivity; no significant concern regarding known vocal fold paralysis impacting swallowing. Recommend pt continue regular textures and thin liquids, but would advise f/u with OP SLP to target voice, cough and reflux education. Note that a chin tuck stretegy may also be beneficial to reduce coughing in public and pt advised to experiment with position independently. Factors that may increase risk of adverse event in presence of aspiration Marilyn [name] & Marilyn [name] 2021): Respiratory or GI disease  Swallow Evaluation Recommendations Recommendations: PO diet PO Diet Recommendation: Regular;Thin liquids (Level 0) Liquid Administration via: Cup;Straw Medication Administration: Whole meds with liquid Supervision: Patient able to self-feed Swallowing strategies  : Chin tuck Postural changes: Position pt fully upright for meals;Stay upright 30-60 min after meals Oral care recommendations: Pt independent with oral care      Marilyn [name], Marilyn [name] 11/19/2023,12:10 PM

## 2023-11-26 MED ORDER — ACCU-CHEK SOFTCLIX LANCETS MISC: 12 refills | Status: AC

## 2023-11-26 MED ORDER — GLUCOSE BLOOD VI STRP: ORAL_STRIP | 12 refills | Status: AC

## 2023-12-02 ENCOUNTER — Ambulatory Visit: Admitting: Family Medicine

## 2023-12-13 NOTE — Progress Notes (Incomplete)
 58 y.o. [id number] postmenopausal female with history of fibroids here for annual exam. Single.  Patient's last menstrual period was 02/21/2013.  She reports would like STI testing today. Reports a mole/cyst on the right side of labia that drains ~1x/yr. Does not shave. Hx of fibroids, last ultrasound 2022.  Had an Xray done in 2023 by chiropractor that showed a blob in her pelvis. She wants this reviewed. Urine sample provided: Yes  Abnormal bleeding: none Pelvic discharge or pain: none Breast mass, nipple discharge or skin changes : none  Sexually active: Not currently Last PAP: 08/05/19 NIL Last mammogram: 09/12/23 Bi-Rads 1, density A Last colonoscopy: 08/23/22 5 year recall  Exercising: not currently Smoker: no  Flowsheet Row Office Visit from 12/16/2023 in [facility]  PHQ-2 Total Score 0    Flowsheet Row Video Visit from 01/24/2023 in [facility] [place]  PHQ-9 Total Score 0     GYN HISTORY: 2016, breast biopsy-fibroadenoma  OB History  Gravida Para Term Preterm AB Living  4 3 3  1 3   SAB IAB Ectopic Multiple Live Births   1   3    # Outcome Date GA Lbr Len/2nd Weight Sex Type Anes PTL Lv  4 IAB           3 Term      Vag-Spont   LIV  2 Term      Vag-Spont   LIV  1 Term      Vag-Spont   LIV   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   Aortic atherosclerosis (HCC) 02/07/2022   Arthritis    ASTHMA 01/30/2007   Asthma    Breast tumor    right; benign   Chest pain 01/24/2017   CHEST PAIN 01/29/2009   Qualifier: Diagnosis of  By: Jimmy MD, Charlie Scarlet    Essential hypertension 07/26/2021   GERD 01/31/2007   Hemorrhoids    Hiatal hernia    Hyperlipidemia    Hypersomnolence 12/15/2012   HYPOTHYROIDISM 01/30/2007   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   Neck pain 09/09/2011   Obesity 04/11/2014   Pericardial effusion 02/07/2022   Sleep apnea    Past Surgical  History:  Procedure Laterality Date   BREAST BIOPSY Right 12/2014   BREAST EXCISIONAL BIOPSY Right 2016   BREAST LUMPECTOMY WITH RADIOACTIVE SEED LOCALIZATION Right 02/25/2015   Procedure: BREAST LUMPECTOMY WITH RADIOACTIVE SEED LOCALIZATION;  Surgeon: Alm Angle, MD;  Location: [facility];  Service: General;  Laterality: Right;   BREAST REDUCTION SURGERY  1986   BREAST SURGERY     broken jaw     CARPAL TUNNEL RELEASE  2001   COLONOSCOPY     FRACTURE SURGERY     REDUCTION MAMMAPLASTY Bilateral    THYROIDECTOMY     TUBAL LIGATION  1987   UPPER GASTROINTESTINAL ENDOSCOPY     Current Outpatient Medications on File Prior to Visit  Medication Sig Dispense Refill   Accu-Chek Softclix Lancets lancets Test three times a day 100 each 12   acetaminophen  (TYLENOL ) 500 MG tablet Take 1 tablet (500 mg total) by mouth every 6 (six) hours as needed. 20 tablet 0   albuterol  (VENTOLIN  HFA) 108 (90 Base) MCG/ACT inhaler Inhale 1-2 puffs into the lungs every 6 (six) hours as needed for wheezing or shortness of breath. 8.5 g 0   aluminum  hydroxide (AMPHOJEL/ALTERNAGEL) 320 MG/5ML suspension Take 30 mLs  by mouth every 6 (six) hours as needed for indigestion or heartburn. 150 mL 0   amLODipine  (NORVASC ) 10 MG tablet TAKE 1 TABLET BY MOUTH DAILY 90 tablet 2   aspirin  EC 81 MG tablet Take 1 tablet (81 mg total) by mouth daily. Swallow whole. 90 tablet 3   atorvastatin  (LIPITOR) 20 MG tablet Take 20 mg by mouth daily.     azelastine  (ASTELIN ) 0.1 % nasal spray Place 2 sprays into both nostrils 2 (two) times daily. Use in each nostril as directed 30 mL 12   Blood Glucose Monitoring Suppl (ACCU-CHEK GUIDE) w/Device KIT TEST THREE TIMES A DAY 1 kit 0   Blood Pressure Monitoring (COMFORT TOUCH BP CUFF/LARGE) MISC Please measure blood pressure 1 time per day 1-2 hours after taking medication and resting for 5-10 minutes, please keep a log and bring to all future appointments. DX I10 1 each 0    budesonide -formoterol  (SYMBICORT ) 80-4.5 MCG/ACT inhaler Inhale 1-2 puffs into the lungs 2 (two) times daily. 1 each 3   cyclobenzaprine  (FLEXERIL ) 5 MG tablet Take 1 tablet (5 mg total) by mouth 3 (three) times daily as needed. 90 tablet 1   EPINEPHrine  (EPIPEN  2-PAK) 0.3 mg/0.3 mL IJ SOAJ injection Inject 0.3 mg into the muscle as needed for anaphylaxis. 1 each 0   glucose blood test strip Test three times a day 100 each 12   ibuprofen  (ADVIL ) 600 MG tablet Take 1 tablet (600 mg total) by mouth every 6 (six) hours as needed. 12 tablet 0   ipratropium (ATROVENT ) 0.03 % nasal spray Place 2 sprays into both nostrils every 12 (twelve) hours. 30 mL 12   levocetirizine (XYZAL  ALLERGY 24HR) 5 MG tablet Take 1 tablet (5 mg total) by mouth every evening. 30 tablet 3   levothyroxine  (SYNTHROID ) 100 MCG tablet TAKE 1 TABLET BY MOUTH DAILY 90 tablet 0   metoprolol  succinate (TOPROL -XL) 25 MG 24 hr tablet Take 1 tablet (25 mg total) by mouth daily. 90 tablet 3   naproxen  (NAPROSYN ) 500 MG tablet Take 1 tablet (500 mg total) by mouth 2 (two) times daily. 30 tablet 0   ondansetron  (ZOFRAN ) 4 MG tablet Take 1 tablet (4 mg total) by mouth every 8 (eight) hours as needed for nausea or vomiting. 10 tablet 0   pantoprazole  (PROTONIX ) 40 MG tablet Take 1 tablet (40 mg total) by mouth 2 (two) times daily. 60 tablet 2   vitamin B-12 (CYANOCOBALAMIN ) 500 MCG tablet Take 500 mcg by mouth daily.     Vitamin D , Ergocalciferol , (DRISDOL ) 1.25 MG (50000 UNIT) CAPS capsule Take 1 capsule (50,000 Units total) by mouth every 7 (seven) days. 12 capsule 0   Wheat Dextrin (BENEFIBER) POWD Use as directed, daily     No current facility-administered medications on file prior to visit.   Social History   Socioeconomic History   Marital status: Single    Spouse name: Not on file   Number of children: 3   Years of education: Not on file   Highest education level: Some college, no degree  Occupational History   Occupation:  TEACHERS [employer]    Employer: [employer]  Tobacco Use   Smoking status: Former    Current packs/day: 0.00    Average packs/day: 0.3 packs/day for 3.0 years (0.8 ttl pk-yrs)    Types: Cigarettes    Start date: 06/04/1977    Quit date: 06/04/1980    Years since quitting: 43.5   Smokeless tobacco: Never  Vaping Use  Vaping status: Never Used  Substance and Sexual Activity   Alcohol  use: No    Alcohol /week: 0.0 standard drinks of alcohol    Drug use: No   Sexual activity: Not Currently    Comment: 1st intercourse 35 yo-5 partners  Other Topics Concern   Not on file  Social History Narrative   Married   Social Drivers of Health   Financial Resource Strain: Medium Risk (12/01/2023)   Overall Financial Resource Strain (CARDIA)    Difficulty of Paying Living Expenses: Somewhat hard  Food Insecurity: Food Insecurity Present (10/16/2023)   Hunger Vital Sign    Worried About Running Out of Food in the Last Year: Sometimes true    Ran Out of Food in the Last Year: Sometimes true  Transportation Needs: No Transportation Needs (10/16/2023)   PRAPARE - Administrator, Civil Service (Medical): No    Lack of Transportation (Non-Medical): No  Physical Activity: Insufficiently Active (12/01/2023)   Exercise Vital Sign    Days of Exercise per Week: 2 days    Minutes of Exercise per Session: 30 min  Stress: No Stress Concern Present (12/01/2023)   [employer] of Occupational Health - Occupational Stress Questionnaire    Feeling of Stress: Not at all  Social Connections: Moderately Isolated (12/01/2023)   Social Connection and Isolation Panel    Frequency of Communication with Friends and Family: More than three times a week    Frequency of Social Gatherings with Friends and Family: More than three times a week    Attends Religious Services: More than 4 times per year    Active Member of [employer] or Organizations: No    Attends Engineer, structural: Not on file     Marital Status: Divorced  Intimate Partner Violence: Unknown (09/07/2021)   Received from [facility]   HITS    Physically Hurt: Not on file    Insult or Talk Down To: Not on file    Threaten Physical Harm: Not on file    Scream or Curse: Not on file   Family History  Problem Relation Age of Onset   Asthma Mother    Hypertension Father    Diabetes Father    Hyperlipidemia Father    Breast cancer Sister 73   Thyroid  disease Sister    Migraines Daughter    Breast cancer Maternal Aunt    Diabetes Maternal Grandmother    Heart disease Maternal Grandmother    Heart attack Maternal Grandmother 60   Stroke Maternal Grandmother    Asthma Maternal Grandfather    Heart attack Maternal Grandfather    Stroke Maternal Grandfather    Colon cancer Brother    Esophageal cancer Neg Hx    Stomach cancer Neg Hx    Rectal cancer Neg Hx    Allergies  Allergen Reactions   Promethazine -Dm    Tuna [Fish Allergy] Anaphylaxis   Codeine Nausea And Vomiting   Fluticasone  Propionate Other (See Comments)   Promethazine  Other (See Comments)   Atorvastatin      Muscle aches    Flonase  [Fluticasone  Propionate] Other (See Comments)    Nose bleeds     PE Today's Vitals   12/16/23 1100  BP: 122/76  Pulse: 81  Temp: 97.8 F (36.6 C)  TempSrc: Oral  SpO2: 98%  Weight: 198 lb (89.8 kg)  Height: 5' 2.25 (1.581 m)   Body mass index is 35.92 kg/m.  Physical Exam Vitals reviewed. Exam conducted with a chaperone present.  Constitutional:  General: She is not in acute distress.    Appearance: Normal appearance.  HENT:     Head: Normocephalic and atraumatic.     Nose: Nose normal.  Eyes:     Extraocular Movements: Extraocular movements intact.     Conjunctiva/sclera: Conjunctivae normal.  Neck:     Comments: Thyroidectomy Pulmonary:     Effort: Pulmonary effort is normal.  Chest:     Chest wall: No mass or tenderness.  Breasts:    Right: Normal. No swelling, mass, nipple discharge,  skin change or tenderness.     Left: Normal. No swelling, mass, nipple discharge, skin change or tenderness.  Abdominal:     General: There is no distension.     Palpations: Abdomen is soft.     Tenderness: There is no abdominal tenderness.  Genitourinary:    General: Normal vulva.     Exam position: Lithotomy position.     Urethra: No prolapse.     Vagina: Normal. No vaginal discharge or bleeding.     Cervix: Normal. No lesion.     Uterus: Normal. Not enlarged and not tender.      Adnexa: Right adnexa normal and left adnexa normal.     Comments: Hair bump/cyst of right crural fold Musculoskeletal:        General: Normal range of motion.     Cervical back: Normal range of motion.  Lymphadenopathy:     Upper Body:     Right upper body: No axillary adenopathy.     Left upper body: No axillary adenopathy.     Lower Body: No right inguinal adenopathy. No left inguinal adenopathy.  Skin:    General: Skin is warm and dry.  Neurological:     General: No focal deficit present.     Mental Status: She is alert.  Psychiatric:        Mood and Affect: Mood normal.        Behavior: Behavior normal.      Assessment and Plan:        Well woman exam with routine gynecological exam Assessment & Plan: Cervical cancer screening performed according to ASCCP guidelines. Encouraged annual mammogram screening Colonoscopy UTD DXA plan for 63 Labs and immunizations with her primary Encouraged safe sexual practices as indicated Encouraged healthy lifestyle practices with diet and exercise For patients under 50-70yo, I recommend 1200mg  calcium  daily and 600IU of vitamin D  daily.    Cervical cancer screening -     Cytology - PAP  Screen for STD (sexually transmitted disease) -     Hepatitis B surface antigen -     RPR -     HIV Antibody (routine testing w rflx) -     Cytology - PAP  Negative depression screening  Intramural and submucous leiomyoma of uterus Assessment & Plan: Known  fibroids, last imaged in 2023 via CT scan, reviewed with patient this is the most likely finding on her pelvic ultrasound Submitting records release to review x-rays from chiropractor    Vera LULLA Pa, MD

## 2023-12-16 ENCOUNTER — Encounter: Payer: Self-pay | Admitting: Obstetrics and Gynecology

## 2023-12-16 ENCOUNTER — Ambulatory Visit (INDEPENDENT_AMBULATORY_CARE_PROVIDER_SITE_OTHER): Payer: Self-pay | Admitting: Obstetrics and Gynecology

## 2023-12-16 ENCOUNTER — Other Ambulatory Visit (HOSPITAL_COMMUNITY)
Admission: RE | Admit: 2023-12-16 | Discharge: 2023-12-16 | Disposition: A | Discharge status: Home / Self Care | Source: Ambulatory Visit | Attending: Obstetrics and Gynecology | Admitting: Obstetrics and Gynecology

## 2023-12-16 ENCOUNTER — Encounter: Payer: Self-pay | Admitting: Oncology

## 2023-12-16 VITALS — BP 122/76 | HR 81 | Temp 97.8°F | Ht 62.25 in | Wt 198.0 lb

## 2023-12-16 DIAGNOSIS — D251 Intramural leiomyoma of uterus: Secondary | ICD-10-CM | POA: Diagnosis not present

## 2023-12-16 DIAGNOSIS — Z113 Encounter for screening for infections with a predominantly sexual mode of transmission: Secondary | ICD-10-CM

## 2023-12-16 DIAGNOSIS — Z124 Encounter for screening for malignant neoplasm of cervix: Secondary | ICD-10-CM | POA: Insufficient documentation

## 2023-12-16 DIAGNOSIS — Z01419 Encounter for gynecological examination (general) (routine) without abnormal findings: Secondary | ICD-10-CM | POA: Insufficient documentation

## 2023-12-16 DIAGNOSIS — Z1331 Encounter for screening for depression: Secondary | ICD-10-CM | POA: Diagnosis not present

## 2023-12-16 DIAGNOSIS — D250 Submucous leiomyoma of uterus: Secondary | ICD-10-CM | POA: Insufficient documentation

## 2023-12-16 NOTE — Assessment & Plan Note (Signed)
 Cervical cancer screening performed according to ASCCP guidelines. Encouraged annual mammogram screening Colonoscopy UTD DXA plan for 63 Labs and immunizations with her primary Encouraged safe sexual practices as indicated Encouraged healthy lifestyle practices with diet and exercise For patients under 50-60yo, I recommend 1200mg  calcium  daily and 600IU of vitamin D  daily.

## 2023-12-16 NOTE — Assessment & Plan Note (Signed)
 Known fibroids, last imaged in 2023 via CT scan, reviewed with patient this is the most likely finding on her pelvic ultrasound Submitting records release to review x-rays from chiropractor

## 2023-12-16 NOTE — Patient Instructions (Signed)

## 2023-12-17 ENCOUNTER — Telehealth: Payer: Self-pay | Admitting: Cardiovascular Disease

## 2023-12-17 ENCOUNTER — Encounter (HOSPITAL_BASED_OUTPATIENT_CLINIC_OR_DEPARTMENT_OTHER): Payer: Self-pay

## 2023-12-17 ENCOUNTER — Ambulatory Visit: Payer: Self-pay | Admitting: Obstetrics and Gynecology

## 2023-12-17 LAB — HIV ANTIBODY (ROUTINE TESTING W REFLEX): HIV 1&2 Ab, 4th Generation: NONREACTIVE

## 2023-12-17 LAB — HEPATITIS B SURFACE ANTIGEN: Hepatitis B Surface Ag: NONREACTIVE

## 2023-12-17 LAB — RPR: RPR Ser Ql: NONREACTIVE

## 2023-12-17 NOTE — Telephone Encounter (Signed)
 See phone message 7/15

## 2023-12-17 NOTE — Telephone Encounter (Signed)
 Swelling in ankles, legs get tight Goes down at night, up moving around starts hurting and swelling Some shortness of breath when going up steps at times, been going on since end of May  Blood pressure has been doing ok but not taking meds at same time (sometimes forgets)  Tries to be diligent about drinking water and avoiding salt but probably needs to do better  Daughter has noticed her breathing hard when she I just sitting   Having to use inhalers more often, does help with breathing   To prevent or reduce lower extremity swelling: Eat a low salt diet. Salt makes the body hold onto extra fluid which causes swelling. Sit with legs elevated. For example, in the recliner or on an ottoman.  Wear knee-high compression stockings during the daytime. Ones labeled 15-20 mmHg provide good compression.  Above reviewed with patient however patient is concerned cardiac related   Will forward to Caitlin W NP for review

## 2023-12-17 NOTE — Telephone Encounter (Signed)
 Pt c/o swelling: STAT is pt has developed SOB within 24 hours  How much weight have you gained and in what time span?  Patient says she gained about 15-20 lbs since the end of May  when she retired.  If swelling, where is the swelling located?  Legs, ankles + pain in legs, radiating up to her waist. Patient also mentions having headaches recently. See recent patient message.  Are you currently taking a fluid pill?  No   Are you currently SOB?  No   Do you have a log of your daily weights (if so, list)?  No, but patient says she weighs around 200 lbs, but she was at 180-191 lbs for a while  Have you gained 3 pounds in a day or 5 pounds in a week?   Have you traveled recently?  No

## 2023-12-18 ENCOUNTER — Telehealth: Payer: Self-pay | Admitting: *Deleted

## 2023-12-18 NOTE — Telephone Encounter (Signed)
 Recommend Lasix 20mg  daily x 3 days then as needed for swelling. Okay to send in 30 tablets with zero refills. Could use 7/23 slot with Dr. Raford or one of my [employer] slots or slot at [facility].   Tim Wilhide S Airanna Partin, NP

## 2023-12-18 NOTE — Telephone Encounter (Signed)
 Patient left message on triage line 12/17/23 at 1607. Requesting return call to f/u on MyChart message results message.   Call returned to patient, Left message to call [facility] Triage at [phone], option 4.

## 2023-12-18 NOTE — Telephone Encounter (Signed)
 Left message to call back

## 2023-12-19 LAB — CYTOLOGY - PAP
Chlamydia: NEGATIVE
Comment: NEGATIVE
Comment: NEGATIVE
Comment: NEGATIVE
Comment: NORMAL
Diagnosis: NEGATIVE
High risk HPV: NEGATIVE
Neisseria Gonorrhea: NEGATIVE
Trichomonas: NEGATIVE

## 2023-12-26 ENCOUNTER — Encounter (HOSPITAL_BASED_OUTPATIENT_CLINIC_OR_DEPARTMENT_OTHER): Payer: Self-pay | Admitting: Pulmonary Disease

## 2023-12-26 ENCOUNTER — Ambulatory Visit (HOSPITAL_BASED_OUTPATIENT_CLINIC_OR_DEPARTMENT_OTHER): Admitting: Pulmonary Disease

## 2023-12-26 VITALS — BP 127/66 | HR 77 | Ht 62.0 in | Wt 201.0 lb

## 2023-12-26 DIAGNOSIS — J4530 Mild persistent asthma, uncomplicated: Secondary | ICD-10-CM | POA: Diagnosis not present

## 2023-12-26 DIAGNOSIS — J3800 Paralysis of vocal cords and larynx, unspecified: Secondary | ICD-10-CM | POA: Diagnosis not present

## 2023-12-26 DIAGNOSIS — G4733 Obstructive sleep apnea (adult) (pediatric): Secondary | ICD-10-CM | POA: Diagnosis not present

## 2023-12-26 NOTE — Progress Notes (Signed)
 Subjective:    Patient ID: Marilyn [name], female    DOB: [birth year], 58 y.o.   MRN: [id number]  58 yo For FU of asthma, vocal cord paralysis & OSA   PMH : GERD vocal cord paralysis  thyroidectomy at the age of 54  right mandibular fx sx repair as a teenagaer  Discussed the use of AI scribe software for clinical note transcription with the patient, who gave verbal consent to proceed.  History of Present Illness Marilyn [name] is a 58 year old female with asthma, OSA, vocal cord paralysis, and severe reflux who presents with shortness of breath and sensation of something stuck in her throat. She was referred by an ENT for further evaluation of her vocal cord paralysis and reflux.  She experiences shortness of breath, especially during physical activity, talking, or emotional distress. Symbicort  and albuterol  inhalers provide minimal relief, and she has increased her usage over the past couple of years. Her weight fluctuates between 190 and 200 pounds, which she believes may contribute to her symptoms. She is prediabetic and monitors her blood sugar levels.  Vocal cord paralysis, identified by an ENT, results in episodes of choking and strangling while talking or sipping liquids. This condition is possibly related to thyroid  surgery at age 22.  She has obstructive sleep apnea with an AHI of 28 events per hour and struggles with CPAP therapy due to mask discomfort. She wakes up feeling unable to breathe when using the machine and has not found a tolerable mask despite trying different options.  Her family history includes asthma, with her mother and great-grandfather having passed away from the condition.     Significant tests/ events reviewed  03/2023 CT cors >> clear lungs 09/2023 PFTs nml 12/2021 HST AHI 28.5  per hour and oxygen desaturation to 75%.   Esophagram 10/2018 >> severe reflux, Marked esophageal dysmotility  Repeat 11/2023 >> Mild esophageal dysmotility ,  no reflux  Review of Systems  neg for any significant sore throat, dysphagia, itching, sneezing, nasal congestion or excess/ purulent secretions, fever, chills, sweats, unintended wt loss, pleuritic or exertional cp, hempoptysis, orthopnea pnd or change in chronic leg swelling. Also denies presyncope, palpitations, heartburn, abdominal pain, nausea, vomiting, diarrhea or change in bowel or urinary habits, dysuria,hematuria, rash, arthralgias, visual complaints, headache, numbness weakness or ataxia.      Objective:   Physical Exam  Gen. Pleasant, obese, in no distress ENT - no lesions, no post nasal drip Neck: No JVD, no thyromegaly, no carotid bruits Lungs: no use of accessory muscles, no dullness to percussion, decreased without rales or rhonchi  Cardiovascular: Rhythm regular, heart sounds  normal, no murmurs or gallops, no peripheral edema Musculoskeletal: No deformities, no cyanosis or clubbing , no tremors       Assessment & Plan:   Assessment and Plan Assessment & Plan Vocal cord paralysis Chronic vocal cord paralysis likely secondary to childhood thyroid  surgery. Symptoms include dyspnea, choking while talking or drinking, and difficulty with vocal projection. Left vocal cord is paralyzed, with possible compensation by the right. Surgical options are a last resort due to limited benefit. - Refer to speech therapy in [place]. - Consult ENT regarding surgical interventions if speech therapy is ineffective.  Gastroesophageal reflux disease (GERD) Significant reflux on esophagram, contributing to throat discomfort and possibly exacerbating vocal cord issues.  -PPI bid  Obstructive sleep apnea (OSA) Severe OSA with an AHI of 28 events per hour. CPAP therapy is challenging due to mask discomfort.  Reports good sleep quality without CPAP but acknowledges cardiovascular risks of untreated OSA. TMJ issues complicate dental device use. - Provide a nasal mask for CPAP. - Refer to  a sleep dentist for evaluation of a dental device. - Discuss weight loss as a potential intervention for OSA.  Asthma Asthma diagnosis is questioned due to normal breathing tests and symptoms aligning more with vocal cord dysfunction. Current inhalers (Symbicort  and albuterol ) are not perceived as helpful. - Continue current inhalers (Symbicort  and albuterol ). - Reassess asthma diagnosis after speech therapy.  Prediabetes Blood glucose monitoring is more frequent than necessary. - Discuss blood glucose monitoring frequency with primary care provider. - Consider weight loss interventions.

## 2023-12-26 NOTE — Patient Instructions (Signed)
  VISIT SUMMARY: Today, we discussed your ongoing issues with shortness of breath, vocal cord paralysis, obstructive sleep apnea (OSA), asthma, and prediabetes. We reviewed your symptoms and current treatments, and we have made some adjustments to your care plan to help manage these conditions more effectively.  YOUR PLAN: -VOCAL CORD PARALYSIS: Vocal cord paralysis means one of your vocal cords does not move properly, which can cause difficulty breathing, choking, and trouble speaking. We will refer you to speech therapy in [place] help manage your symptoms. If speech therapy does not help, we will consult with an ENT specialist to discuss possible surgical options.  -GASTROESOPHAGEAL REFLUX DISEASE (GERD): GERD is a condition where stomach acid frequently flows back into the tube connecting your mouth and stomach, causing discomfort. This may be contributing to your throat issues. We will continue to monitor and manage this condition.  -OBSTRUCTIVE SLEEP APNEA (OSA): OSA is a sleep disorder where your breathing repeatedly stops and starts during sleep. We will provide you with a nasal mask for your CPAP machine to improve comfort. Additionally, we will refer you to a sleep dentist to evaluate a dental device that might help. Weight loss was also discussed as a potential way to improve your OSA.  -ASTHMA: Asthma is a condition where your airways narrow and swell, making it difficult to breathe. However, your symptoms might be more related to vocal cord dysfunction. We will continue your current inhalers (Symbicort  and albuterol ) and reassess your asthma diagnosis after you have completed speech therapy.  -PREDIABETES: Prediabetes means your blood sugar levels are higher than normal but not high enough to be classified as diabetes. We discussed that you might be monitoring your blood sugar more frequently than necessary. Please talk to your primary care provider about how often you should be checking  your levels. We also discussed weight loss as a beneficial intervention.  INSTRUCTIONS: Please follow up with the speech therapist in [place] as soon as possible. Continue using your current inhalers and monitor your blood sugar levels as discussed with your primary care provider. We will also refer you to a sleep dentist for a dental device evaluation and provide you with a nasal mask for your CPAP machine. Consider weight loss strategies to help manage your conditions. If you have any questions or concerns, please do not hesitate to contact our office.                      Contains text generated by [employer].                                 Contains text generated by [employer].

## 2023-12-31 ENCOUNTER — Telehealth (HOSPITAL_BASED_OUTPATIENT_CLINIC_OR_DEPARTMENT_OTHER): Payer: Self-pay | Admitting: Pulmonary Disease

## 2023-12-31 DIAGNOSIS — G4733 Obstructive sleep apnea (adult) (pediatric): Secondary | ICD-10-CM

## 2023-12-31 NOTE — Telephone Encounter (Signed)
 Order placed

## 2023-12-31 NOTE — Telephone Encounter (Signed)
 Copied from CRM #[id number]. Topic: Referral - Status >> Dec 31, 2023 11:00 AM [name] wrote: Reason for CRM: Patient  states Dr. Jude advised her he will be sending a referral to a Dr. Oneil Forget (Orthodontics) but patient states she called the clinic and was advised that no referral has ben sent yet.   Warren, can you place referral to Dr Oneil Forget? Patient was seen 12/26/23 and RA advised for her to see him, but no referral was ever made according to her chart.

## 2024-01-02 NOTE — Telephone Encounter (Signed)
 Spoke with patient, patient read her results wrong initially. No additional questions. Appreciative of call.   Encounter closed.

## 2024-01-08 NOTE — Telephone Encounter (Signed)
 Spoke with patient and swelling has improved  She does have some random pains in shin and thigh area from time to time.  Stated she has gained some weight and needs to get it off, will work on doing so Advised if leg pains worsen to follow up with PCP, verbalized understanding

## 2024-01-20 ENCOUNTER — Other Ambulatory Visit: Payer: Self-pay | Admitting: Family Medicine

## 2024-01-20 DIAGNOSIS — E559 Vitamin D deficiency, unspecified: Secondary | ICD-10-CM

## 2024-02-10 ENCOUNTER — Ambulatory Visit: Payer: BC Managed Care – PPO | Admitting: Internal Medicine

## 2024-02-10 NOTE — Progress Notes (Deleted)
 Patient ID: Marilyn [name], female   DOB: September 24, 1965, 58 y.o.   MRN: [id number]  HPI  Marilyn [name] is a 58 y.o.-year-old female, initially referred by her PCP, Dr. Mercer, returning for follow-up for hypothyroidism. Her mother, Marilyn [name], and sister, Marilyn  [name], are also my pts. Last visit 1 year ago.  Interim history: She continues to have weight gain/inability lose weight, constipation, leg swelling. She continues to have problems swallowing with foods and pills getting stuck in her throat.  Reviewed history: Pt. 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 She saw Dr. Parnell in the past until he retired.  She was initially on Synthroid  DAW, then switched to generic.  Pt. is on levothyroxine  100 mcg daily, taken: - in am (4-4:30) - fasting - at least 2h from b'fast - no calcium  - no iron - + multivitamins - 30 min to 1h later >> moved 4h later - + PPIs - prn, in am >> moved 4h later - not on Biotin  I reviewed pt's thyroid  tests: Lab Results  Component Value Date   TSH 0.88 10/31/2023   TSH 0.60 02/07/2023   TSH 0.58 08/03/2022   TSH 0.80 01/12/2022   TSH 0.352 (L) 11/17/2021   TSH 0.64 07/11/2021   TSH 0.52 03/04/2020   TSH 1.06 07/17/2019   TSH 1.288 05/14/2019   TSH 1.63 05/12/2018   FREET4 1.11 10/31/2023   FREET4 1.01 02/07/2023   FREET4 1.01 08/03/2022   FREET4 0.99 01/12/2022   FREET4 0.83 07/17/2019   FREET4 0.94 06/18/2017   T3FREE 2.1 07/18/2015   Antithyroid antibodies: No results found for: THGAB No components found for: TPOAB  At our first visit, she mentioned: - fatigue - weight gain and increased appetite - both heat and cold intolerance - constipation - hair loss - joint and muscle pains This symptoms mostly persist.  She continues to have: -Dysphagia with pills -Pressure in the back of her neck so that she cannot swallow well and sometimes has to spit -She feels a blockage in  her throat -She cannot spit phlegm -Also, problems breathing-had a panic attack when she had a sleep study -Feeling strangled especially when more anxious but also with drinking water or eating something hot -she is scared to eat in public but also at home. -voice giving out -She also has pressure in R ear and HAs. Sees neurology.  She saw ENT in the past and had the following studies: CT neck (10/28/2018): Pharynx and larynx: No mucosal or submucosal lesion is seen. Salivary glands: Parotid and submandibular glands appear symmetric and normal. No evidence of inflammation or stone disease. Thyroid : Diminutive thyroid  tissue.  No mass. Lymph nodes: No enlarged or low-density nodes on either side of the neck. Vascular: No abnormal vascular finding. Jugular veins widely patent. Limited intracranial: Normal Visualized orbits: Normal Mastoids and visualized paranasal sinuses: Clear Skeleton: Old mandibular fracture on the right, healed. No significant bone finding. No evidence of significant dental or periodontal disease.  Barium swallow (11/21/2018): There is premature spillage of the barium bolus into the vallecula and hypopharynx with every swallow. Otherwise normal oral and pharyngeal phase of swallowing, with no laryngeal penetration or tracheobronchial aspiration. No significant barium retention in the pharynx. No evidence of pharyngeal mass, stricture or diverticulum. No significant cricopharyngeus muscle dysfunction.   Marked esophageal dysmotility, characterized by intermittent prominent weakening of primary peristalsis throughout the thoracic esophagus. Small hiatal hernia, which appears fixed. Severe gastroesophageal reflux elicited  to the level of the thoracic inlet with water siphon test and Valsalva maneuver. There is granularity of the esophageal mucosa throughout the thoracic esophagus compatible with mild reflux esophagitis. No evidence of esophageal mass, stricture or  ulcer. Patient was unable to swallow the barium tablet, which limits evaluation.   IMPRESSION: 1. Small hiatal hernia.  Severe gastroesophageal reflux elicited. 2. 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 3. Mild reflux esophagitis. No evidence of esophageal mass, stricture or ulcer, see comments. 4. Premature spillage of barium bolus into the vallecula and hypopharynx with every swallow. Otherwise normal oral and pharyngeal phases of swallowing.   She has + FH of thyroid  disorders in: sister and mother (also my pts). No FH of thyroid  cancer. No h/o radiation tx to head or neck. No recent use of iodine supplements.  No herbal supplements.  No recent steroid use.  Pt. also has a history of CAD, h/o pericardial effusion, HTN, HL, OSA, GERD, vitamin D  deficiency, TMJ.  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   Aortic atherosclerosis (HCC) 02/07/2022   Arthritis    ASTHMA 01/30/2007   Asthma    Breast tumor    right; benign   Chest pain 01/24/2017   CHEST PAIN 01/29/2009   Qualifier: Diagnosis of  By: Jimmy MD, Charlie Scarlet    Essential hypertension 07/26/2021   GERD 01/31/2007   Hemorrhoids    Hiatal hernia    Hyperlipidemia    Hypersomnolence 12/15/2012   HYPOTHYROIDISM 01/30/2007   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   Neck pain 09/09/2011   Obesity 04/11/2014   Pericardial effusion 02/07/2022   Sleep apnea    Past Surgical History:  Procedure Laterality Date   BREAST BIOPSY Right 12/2014   BREAST EXCISIONAL BIOPSY Right 2016   BREAST LUMPECTOMY WITH RADIOACTIVE SEED LOCALIZATION Right 02/25/2015   Procedure: BREAST LUMPECTOMY WITH RADIOACTIVE SEED LOCALIZATION;  Surgeon: Alm Angle,  MD;  Location: [facility];  Service: General;  Laterality: Right;   BREAST REDUCTION SURGERY  1986   BREAST SURGERY     broken jaw     CARPAL TUNNEL RELEASE  2001   COLONOSCOPY     FRACTURE SURGERY     REDUCTION MAMMAPLASTY Bilateral    THYROIDECTOMY     TUBAL LIGATION  1987   UPPER GASTROINTESTINAL ENDOSCOPY     Social History   Socioeconomic History   Marital status: Single    Spouse name: Not on file   Number of children: 3   Years of education: Not on file   Highest education level: Some college, no degree  Occupational History   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[employer]    Employer: [employer]  Tobacco Use   Smoking status: Former    Current packs/day: 0.00    Average packs/day: 0.3 packs/day for 3.0 years (0.8 ttl pk-yrs)    Types: Cigarettes    Start date: 06/04/1977    Quit date: 06/04/1980    Years since quitting: 43.7   Smokeless tobacco: Never  Vaping Use   Vaping status: Never Used  Substance and Sexual Activity   Alcohol  use: No    Alcohol /week: 0.0 standard drinks of alcohol    Drug use: No   Sexual activity: Not Currently    Comment: 1st intercourse 12 yo-5 partners  Other Topics Concern   Not on file  Social History Narrative   Married   Social Drivers of Health   Financial Resource Strain: Medium Risk (12/01/2023)   Overall Financial Resource Strain (CARDIA)    Difficulty of Paying Living Expenses: Somewhat hard  Food Insecurity: Food Insecurity Present (10/16/2023)   Hunger Vital Sign    Worried About Running Out of Food in the Last Year: Sometimes true    Ran Out of Food in the Last Year: Sometimes true  Transportation Needs: No Transportation Needs (10/16/2023)   PRAPARE - Administrator, Civil Service (Medical): No    Lack of Transportation (Non-Medical): No  Physical Activity: Insufficiently Active (12/01/2023)   Exercise Vital Sign    Days of Exercise per Week: 2 days    Minutes of Exercise per Session: 30 min  Stress: No  Stress Concern Present (12/01/2023)   [employer] of Occupational Health - Occupational Stress Questionnaire    Feeling of Stress: Not at all  Social Connections: Moderately Isolated (12/01/2023)   Social Connection and Isolation Panel    Frequency of Communication with Friends and Family: More than three times a week    Frequency of Social Gatherings with Friends and Family: More than three times a week    Attends Religious Services: More than 4 times per year    Active Member of [employer] or Organizations: No    Attends Engineer, structural: Not on file    Marital Status: Divorced  Intimate Partner Violence: Unknown (09/07/2021)   Received from [facility]   HITS    Physically Hurt: Not on file    Insult or Talk Down To: Not on file    Threaten Physical Harm: Not on file    Scream or Curse: Not on file   Current Outpatient Medications on File Prior to Visit  Medication Sig Dispense Refill   Accu-Chek Softclix Lancets lancets Test three times a day 100 each 12   acetaminophen  (TYLENOL ) 500 MG tablet Take 1 tablet (500 mg total) by mouth every 6 (six) hours as needed. 20 tablet 0   albuterol  (VENTOLIN  HFA) 108 (90 Base) MCG/ACT inhaler Inhale 1-2 puffs into the lungs every 6 (six) hours as needed for wheezing or shortness of breath. 8.5 g 0   aluminum  hydroxide (AMPHOJEL/ALTERNAGEL) 320 MG/5ML suspension Take 30 mLs by mouth every 6 (six) hours as needed for indigestion or heartburn. 150 mL 0   amLODipine  (NORVASC ) 10 MG tablet TAKE 1 TABLET BY MOUTH DAILY 90 tablet 2   aspirin  EC 81 MG tablet Take 1 tablet (81 mg total) by mouth daily. Swallow whole. 90 tablet 3   atorvastatin  (LIPITOR) 20 MG tablet Take 20 mg by mouth daily.     azelastine  (ASTELIN ) 0.1 % nasal spray Place 2 sprays into both nostrils 2 (two) times daily. Use in each nostril as directed 30 mL 12   Blood Glucose Monitoring Suppl (ACCU-CHEK GUIDE) w/Device KIT TEST THREE TIMES A DAY 1 kit 0   Blood Pressure  Monitoring (COMFORT TOUCH BP CUFF/LARGE) MISC Please measure blood pressure 1 time per day 1-2 hours after taking medication and resting for 5-10 minutes, please keep a log and bring to all future appointments. DX I10 1 each 0   budesonide -formoterol  (SYMBICORT ) 80-4.5 MCG/ACT inhaler Inhale 1-2 puffs into the lungs 2 (two) times daily. 1 each 3   cyclobenzaprine  (FLEXERIL ) 5 MG tablet Take 1 tablet (5 mg total) by mouth 3 (three) times daily  as needed. 90 tablet 1   EPINEPHrine  (EPIPEN  2-PAK) 0.3 mg/0.3 mL IJ SOAJ injection Inject 0.3 mg into the muscle as needed for anaphylaxis. 1 each 0   glucose blood test strip Test three times a day 100 each 12   ibuprofen  (ADVIL ) 600 MG tablet Take 1 tablet (600 mg total) by mouth every 6 (six) hours as needed. 12 tablet 0   ipratropium (ATROVENT ) 0.03 % nasal spray Place 2 sprays into both nostrils every 12 (twelve) hours. 30 mL 12   levocetirizine (XYZAL  ALLERGY 24HR) 5 MG tablet Take 1 tablet (5 mg total) by mouth every evening. 30 tablet 3   levothyroxine  (SYNTHROID ) 100 MCG tablet TAKE 1 TABLET BY MOUTH DAILY 90 tablet 0   metoprolol  succinate (TOPROL -XL) 25 MG 24 hr tablet Take 1 tablet (25 mg total) by mouth daily. 90 tablet 3   naproxen  (NAPROSYN ) 500 MG tablet Take 1 tablet (500 mg total) by mouth 2 (two) times daily. 30 tablet 0   ondansetron  (ZOFRAN ) 4 MG tablet Take 1 tablet (4 mg total) by mouth every 8 (eight) hours as needed for nausea or vomiting. 10 tablet 0   pantoprazole  (PROTONIX ) 40 MG tablet Take 1 tablet (40 mg total) by mouth 2 (two) times daily. 60 tablet 2   vitamin B-12 (CYANOCOBALAMIN ) 500 MCG tablet Take 500 mcg by mouth daily.     Vitamin D , Ergocalciferol , (DRISDOL ) 1.25 MG (50000 UNIT) CAPS capsule Take 1 capsule (50,000 Units total) by mouth every 7 (seven) days. 12 capsule 0   Wheat Dextrin (BENEFIBER) POWD Use as directed, daily     No current facility-administered medications on file prior to visit.   Allergies  Allergen  Reactions   Promethazine -Dm    Tuna [Fish Allergy] Anaphylaxis   Codeine Nausea And Vomiting   Fluticasone  Propionate Other (See Comments)   Promethazine  Other (See Comments)   Atorvastatin      Muscle aches    Flonase  [Fluticasone  Propionate] Other (See Comments)    Nose bleeds   Family History  Problem Relation Age of Onset   Asthma Mother    Hypertension Father    Diabetes Father    Hyperlipidemia Father    Breast cancer Sister 55   Thyroid  disease Sister    Migraines Daughter    Breast cancer Maternal Aunt    Diabetes Maternal Grandmother    Heart disease Maternal Grandmother    Heart attack Maternal Grandmother 60   Stroke Maternal Grandmother    Asthma Maternal Grandfather    Heart attack Maternal Grandfather    Stroke Maternal Grandfather    Colon cancer Brother    Esophageal cancer Neg Hx    Stomach cancer Neg Hx    Rectal cancer Neg Hx    PE: LMP 02/21/2013  Wt Readings from Last 20 Encounters:  12/26/23 201 lb (91.2 kg)  12/16/23 198 lb (89.8 kg)  10/31/23 205 lb 12.8 oz (93.4 kg)  10/16/23 205 lb 3.2 oz (93.1 kg)  09/24/23 195 lb 9.6 oz (88.7 kg)  09/17/23 199 lb (90.3 kg)  09/16/23 199 lb (90.3 kg)  09/05/23 199 lb 3.2 oz (90.4 kg)  08/27/23 199 lb 3.2 oz (90.4 kg)  06/07/23 199 lb 12.8 oz (90.6 kg)  03/07/23 199 lb (90.3 kg)  02/07/23 201 lb 12.8 oz (91.5 kg)  11/05/22 196 lb 9.6 oz (89.2 kg)  08/23/22 198 lb (89.8 kg)  08/20/22 197 lb (89.4 kg)  08/16/22 198 lb (89.8 kg)  08/03/22 197 lb 9.6  oz (89.6 kg)  08/03/22 197 lb (89.4 kg)  05/14/22 201 lb (91.2 kg)  04/13/22 196 lb 9.6 oz (89.2 kg)   Constitutional: overweight, in NAD Eyes:  EOMI, no exophthalmos ENT: no neck masses, no cervical lymphadenopathy Cardiovascular: RRR, No MRG Respiratory: CTA B Musculoskeletal: no deformities Skin:no rashes Neurological: no tremor with outstretched hands  ASSESSMENT: 1. Hypothyroidism  2.  Dysphagia - She has esophageal dysmotility - on PPIs -  Sees Dr. Aneita with GI -Patient continues to have bothersome neck symptoms including dysphagia, getting strangled, and voice changes and at last visit she was worried that this may be related to the thyroid .  Her thyroid  surgery was done when she was a child and the symptoms started much later.  Reviewing the report of the CT scan of the neck from 10/2018, this mentions that the thyroid  tissue was diminutive.  There were no masses.  We again discussed that she appears to have had total thyroidectomy.  Based on this report, the thyroid  tissue left behind did not appear to be the culprit for her neck symptoms.  At our visit from 02/2023, we again reviewed her barium swallow report from 11/2018, it appeared that she had severe esophageal dysmotility possibly caused by reflux.  Therefore, symptoms appeared to be functional, rather than structural.  She saw GI (Dr. Aneita) and continues on PPIs.  He mentioned that her dysphagia appears to be stable.  I advised her to discuss with PCP and Dr. Aneita.  PLAN:  1. Patient with longstanding hypothyroidism, on levothyroxine  therapy - latest thyroid  labs reviewed with pt. >> normal: Lab Results  Component Value Date   TSH 0.88 10/31/2023  - she continues on LT4 100 mcg daily - pt feels good on this dose.  At last visit, she was frustrated about inability to lose weight.  She lost 4 pounds before last visit, however.  She was interested in doing a 1 day a week clear liquid fast. - we discussed about taking the thyroid  hormone every day, with water, >30 minutes before breakfast, separated by >4 hours from acid reflux medications, calcium , iron, multivitamins. Pt. is taking it correctly. - will check thyroid  tests today: TSH and fT4 - If labs are abnormal, she will need to return for repeat TFTs in 1.5 months - OTW, I will see her back in a year  Needs refills.  Lela Fendt, MD PhD [facility] Endocr[facility]

## 2024-02-11 ENCOUNTER — Encounter: Payer: Self-pay | Admitting: Oncology

## 2024-02-11 ENCOUNTER — Ambulatory Visit (INDEPENDENT_AMBULATORY_CARE_PROVIDER_SITE_OTHER): Admitting: Internal Medicine

## 2024-02-11 ENCOUNTER — Encounter: Payer: Self-pay | Admitting: Internal Medicine

## 2024-02-11 VITALS — BP 120/80 | HR 97 | Ht 62.0 in | Wt 200.0 lb

## 2024-02-11 DIAGNOSIS — E890 Postprocedural hypothyroidism: Secondary | ICD-10-CM

## 2024-02-11 LAB — TSH: TSH: 0.25 m[IU]/L — ABNORMAL LOW (ref 0.40–4.50)

## 2024-02-11 LAB — T4, FREE: Free T4: 1.5 ng/dL (ref 0.8–1.8)

## 2024-02-11 NOTE — Progress Notes (Signed)
 Patient ID: Marilyn [name], female   DOB: 07-29-1965, 58 y.o.   MRN: [id number] Next dose was precharted 02/10/2024.  HPI  Marilyn [name] is a 58 y.o.-year-old female, initially referred by her PCP, Dr. Mercer, returning for follow-up for hypothyroidism. Her mother, Marilyn [name], and sister, Marilyn [name], are also my pts. Last visit 1 year ago.  Interim history: She feels well at today's visit, without complaints other than hair loss and thinning nails.  She feels that she is under financial stress after she retired recently and she is planning to return to work.  Reviewed history: Pt. 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 She saw Dr. Parnell in the past until he retired.  She was initially on Synthroid  DAW, then switched to generic.  Pt. is on levothyroxine  100 mcg daily, taken: - in am  - fasting - at least 2h from b'fast - no calcium  - no iron - + multivitamins - 30 min to 1h later >> moved 4h later - + PPIs - prn, in am >> moved 4h later - not on Biotin  I reviewed pt's thyroid  tests: Lab Results  Component Value Date   TSH 0.88 10/31/2023   TSH 0.60 02/07/2023   TSH 0.58 08/03/2022   TSH 0.80 01/12/2022   TSH 0.352 (L) 11/17/2021   TSH 0.64 07/11/2021   TSH 0.52 03/04/2020   TSH 1.06 07/17/2019   TSH 1.288 05/14/2019   TSH 1.63 05/12/2018   FREET4 1.11 10/31/2023   FREET4 1.01 02/07/2023   FREET4 1.01 08/03/2022   FREET4 0.99 01/12/2022   FREET4 0.83 07/17/2019   FREET4 0.94 06/18/2017   T3FREE 2.1 07/18/2015   Antithyroid antibodies: No results found for: THGAB No components found for: TPOAB  At our first visit, she mentioned: - fatigue - weight gain and increased appetite - both heat and cold intolerance - constipation - hair loss - joint and muscle pains This symptoms mostly persist.  She previously described: -Dysphagia with pills -Pressure in the back of her neck so that she cannot swallow  well and sometimes has to spit -She feels a blockage in her throat -She cannot spit phlegm -Also, problems breathing-had a panic attack when she had a sleep study -Feeling strangled especially when more anxious but also with drinking water or eating something hot -she is scared to eat in public but also at home. -voice giving out -pressure in R ear and HAs. Sees neurology.  She saw ENT in the past and had the following studies: CT neck (10/28/2018): Pharynx and larynx: No mucosal or submucosal lesion is seen. Salivary glands: Parotid and submandibular glands appear symmetric and normal. No evidence of inflammation or stone disease. Thyroid : Diminutive thyroid  tissue.  No mass. Lymph nodes: No enlarged or low-density nodes on either side of the neck. Vascular: No abnormal vascular finding. Jugular veins widely patent. Limited intracranial: Normal Visualized orbits: Normal Mastoids and visualized paranasal sinuses: Clear Skeleton: Old mandibular fracture on the right, healed. No significant bone finding. No evidence of significant dental or periodontal disease.  Barium swallow (11/21/2018): There is premature spillage of the barium bolus into the vallecula and hypopharynx with every swallow. Otherwise normal oral and pharyngeal phase of swallowing, with no laryngeal penetration or tracheobronchial aspiration. No significant barium retention in the pharynx. No evidence of pharyngeal mass, stricture or diverticulum. No significant cricopharyngeus muscle dysfunction.   Marked esophageal dysmotility, characterized by intermittent prominent weakening of primary peristalsis throughout the thoracic  esophagus. Small hiatal hernia, which appears fixed. Severe gastroesophageal reflux elicited to the level of the thoracic inlet with water siphon test and Valsalva maneuver. There is granularity of the esophageal mucosa throughout the thoracic esophagus compatible with mild reflux esophagitis. No  evidence of esophageal mass, stricture or ulcer. Patient was unable to swallow the barium tablet, which limits evaluation.   IMPRESSION: 1. Small hiatal hernia.  Severe gastroesophageal reflux elicited. 2. 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 3. Mild reflux esophagitis. No evidence of esophageal mass, stricture or ulcer, see comments. 4. Premature spillage of barium bolus into the vallecula and hypopharynx with every swallow. Otherwise normal oral and pharyngeal phases of swallowing.   She has + FH of thyroid  disorders in: sister and mother (also my pts). No FH of thyroid  cancer. No h/o radiation tx to head or neck. No recent use of iodine supplements.  No herbal supplements.  No recent steroid use.  Pt. also has a history of CAD, h/o pericardial effusion, HTN, HL, OSA, GERD, vitamin D  deficiency, TMJ.  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   Aortic atherosclerosis (HCC) 02/07/2022   Arthritis    ASTHMA 01/30/2007   Asthma    Breast tumor    right; benign   Chest pain 01/24/2017   CHEST PAIN 01/29/2009   Qualifier: Diagnosis of  By: Jimmy MD, Charlie Scarlet    Essential hypertension 07/26/2021   GERD 01/31/2007   Hemorrhoids    Hiatal hernia    Hyperlipidemia    Hypersomnolence 12/15/2012   HYPOTHYROIDISM 01/30/2007   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   Neck pain 09/09/2011   Obesity 04/11/2014   Pericardial effusion 02/07/2022   Sleep apnea    Past Surgical History:  Procedure Laterality Date   BREAST BIOPSY Right 12/2014   BREAST EXCISIONAL BIOPSY Right 2016   BREAST LUMPECTOMY WITH RADIOACTIVE SEED LOCALIZATION Right 02/25/2015   Procedure: BREAST LUMPECTOMY WITH RADIOACTIVE  SEED LOCALIZATION;  Surgeon: Alm Angle, MD;  Location: [facility];  Service: General;  Laterality: Right;   BREAST REDUCTION SURGERY  1986   BREAST SURGERY     broken jaw     CARPAL TUNNEL RELEASE  2001   COLONOSCOPY     FRACTURE SURGERY     REDUCTION MAMMAPLASTY Bilateral    THYROIDECTOMY     TUBAL LIGATION  1987   UPPER GASTROINTESTINAL ENDOSCOPY     Social History   Socioeconomic History   Marital status: Single    Spouse name: Not on file   Number of children: 3   Years of education: Not on file   Highest education level: Some college, no degree  Occupational History   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[employer]    Employer: [employer]  Tobacco Use   Smoking status: Former    Current packs/day: 0.00    Average packs/day: 0.3 packs/day for 3.0 years (0.8 ttl pk-yrs)    Types: Cigarettes    Start date: 06/04/1977    Quit date: 06/04/1980    Years since quitting: 43.7   Smokeless tobacco: Never  Vaping Use   Vaping status: Never Used  Substance and Sexual Activity   Alcohol  use: No    Alcohol /week: 0.0 standard drinks of alcohol    Drug use: No   Sexual activity:  Not Currently    Comment: 1st intercourse 12 yo-5 partners  Other Topics Concern   Not on file  Social History Narrative   Married   Social Drivers of Health   Financial Resource Strain: Medium Risk (12/01/2023)   Overall Financial Resource Strain (CARDIA)    Difficulty of Paying Living Expenses: Somewhat hard  Food Insecurity: Food Insecurity Present (10/16/2023)   Hunger Vital Sign    Worried About Running Out of Food in the Last Year: Sometimes true    Ran Out of Food in the Last Year: Sometimes true  Transportation Needs: No Transportation Needs (10/16/2023)   PRAPARE - Administrator, Civil Service (Medical): No    Lack of Transportation (Non-Medical): No  Physical Activity: Insufficiently Active (12/01/2023)   Exercise Vital Sign    Days of Exercise per Week: 2 days    Minutes of  Exercise per Session: 30 min  Stress: No Stress Concern Present (12/01/2023)   [employer] of Occupational Health - Occupational Stress Questionnaire    Feeling of Stress: Not at all  Social Connections: Moderately Isolated (12/01/2023)   Social Connection and Isolation Panel    Frequency of Communication with Friends and Family: More than three times a week    Frequency of Social Gatherings with Friends and Family: More than three times a week    Attends Religious Services: More than 4 times per year    Active Member of [employer] or Organizations: No    Attends Engineer, structural: Not on file    Marital Status: Divorced  Intimate Partner Violence: Unknown (09/07/2021)   Received from [facility]   HITS    Physically Hurt: Not on file    Insult or Talk Down To: Not on file    Threaten Physical Harm: Not on file    Scream or Curse: Not on file   Current Outpatient Medications on File Prior to Visit  Medication Sig Dispense Refill   Accu-Chek Softclix Lancets lancets Test three times a day 100 each 12   acetaminophen  (TYLENOL ) 500 MG tablet Take 1 tablet (500 mg total) by mouth every 6 (six) hours as needed. 20 tablet 0   albuterol  (VENTOLIN  HFA) 108 (90 Base) MCG/ACT inhaler Inhale 1-2 puffs into the lungs every 6 (six) hours as needed for wheezing or shortness of breath. 8.5 g 0   aluminum  hydroxide (AMPHOJEL/ALTERNAGEL) 320 MG/5ML suspension Take 30 mLs by mouth every 6 (six) hours as needed for indigestion or heartburn. 150 mL 0   amLODipine  (NORVASC ) 10 MG tablet TAKE 1 TABLET BY MOUTH DAILY 90 tablet 2   aspirin  EC 81 MG tablet Take 1 tablet (81 mg total) by mouth daily. Swallow whole. 90 tablet 3   atorvastatin  (LIPITOR) 20 MG tablet Take 20 mg by mouth daily.     azelastine  (ASTELIN ) 0.1 % nasal spray Place 2 sprays into both nostrils 2 (two) times daily. Use in each nostril as directed 30 mL 12   Blood Glucose Monitoring Suppl (ACCU-CHEK GUIDE) w/Device KIT TEST THREE  TIMES A DAY 1 kit 0   Blood Pressure Monitoring (COMFORT TOUCH BP CUFF/LARGE) MISC Please measure blood pressure 1 time per day 1-2 hours after taking medication and resting for 5-10 minutes, please keep a log and bring to all future appointments. DX I10 1 each 0   budesonide -formoterol  (SYMBICORT ) 80-4.5 MCG/ACT inhaler Inhale 1-2 puffs into the lungs 2 (two) times daily. 1 each 3   cyclobenzaprine  (FLEXERIL ) 5 MG tablet  Take 1 tablet (5 mg total) by mouth 3 (three) times daily as needed. 90 tablet 1   EPINEPHrine  (EPIPEN  2-PAK) 0.3 mg/0.3 mL IJ SOAJ injection Inject 0.3 mg into the muscle as needed for anaphylaxis. 1 each 0   glucose blood test strip Test three times a day 100 each 12   ibuprofen  (ADVIL ) 600 MG tablet Take 1 tablet (600 mg total) by mouth every 6 (six) hours as needed. 12 tablet 0   ipratropium (ATROVENT ) 0.03 % nasal spray Place 2 sprays into both nostrils every 12 (twelve) hours. 30 mL 12   levocetirizine (XYZAL  ALLERGY 24HR) 5 MG tablet Take 1 tablet (5 mg total) by mouth every evening. 30 tablet 3   levothyroxine  (SYNTHROID ) 100 MCG tablet TAKE 1 TABLET BY MOUTH DAILY 90 tablet 0   metoprolol  succinate (TOPROL -XL) 25 MG 24 hr tablet Take 1 tablet (25 mg total) by mouth daily. 90 tablet 3   naproxen  (NAPROSYN ) 500 MG tablet Take 1 tablet (500 mg total) by mouth 2 (two) times daily. 30 tablet 0   ondansetron  (ZOFRAN ) 4 MG tablet Take 1 tablet (4 mg total) by mouth every 8 (eight) hours as needed for nausea or vomiting. 10 tablet 0   pantoprazole  (PROTONIX ) 40 MG tablet Take 1 tablet (40 mg total) by mouth 2 (two) times daily. 60 tablet 2   vitamin B-12 (CYANOCOBALAMIN ) 500 MCG tablet Take 500 mcg by mouth daily.     Vitamin D , Ergocalciferol , (DRISDOL ) 1.25 MG (50000 UNIT) CAPS capsule Take 1 capsule (50,000 Units total) by mouth every 7 (seven) days. 12 capsule 0   Wheat Dextrin (BENEFIBER) POWD Use as directed, daily     No current facility-administered medications on file  prior to visit.   Allergies  Allergen Reactions   Promethazine -Dm    Tuna [Fish Allergy] Anaphylaxis   Codeine Nausea And Vomiting   Fluticasone  Propionate Other (See Comments)   Promethazine  Other (See Comments)   Atorvastatin      Muscle aches    Flonase  [Fluticasone  Propionate] Other (See Comments)    Nose bleeds   Family History  Problem Relation Age of Onset   Asthma Mother    Hypertension Father    Diabetes Father    Hyperlipidemia Father    Breast cancer Sister 15   Thyroid  disease Sister    Migraines Daughter    Breast cancer Maternal Aunt    Diabetes Maternal Grandmother    Heart disease Maternal Grandmother    Heart attack Maternal Grandmother 60   Stroke Maternal Grandmother    Asthma Maternal Grandfather    Heart attack Maternal Grandfather    Stroke Maternal Grandfather    Colon cancer Brother    Esophageal cancer Neg Hx    Stomach cancer Neg Hx    Rectal cancer Neg Hx    PE: BP 120/80   Pulse 97   Ht 5' 2 (1.575 m)   Wt 200 lb (90.7 kg)   LMP 02/21/2013   SpO2 98%   BMI 36.58 kg/m  Wt Readings from Last 20 Encounters:  02/11/24 200 lb (90.7 kg)  12/26/23 201 lb (91.2 kg)  12/16/23 198 lb (89.8 kg)  10/31/23 205 lb 12.8 oz (93.4 kg)  10/16/23 205 lb 3.2 oz (93.1 kg)  09/24/23 195 lb 9.6 oz (88.7 kg)  09/17/23 199 lb (90.3 kg)  09/16/23 199 lb (90.3 kg)  09/05/23 199 lb 3.2 oz (90.4 kg)  08/27/23 199 lb 3.2 oz (90.4 kg)  06/07/23 199 lb  12.8 oz (90.6 kg)  03/07/23 199 lb (90.3 kg)  02/07/23 201 lb 12.8 oz (91.5 kg)  11/05/22 196 lb 9.6 oz (89.2 kg)  08/23/22 198 lb (89.8 kg)  08/20/22 197 lb (89.4 kg)  08/16/22 198 lb (89.8 kg)  08/03/22 197 lb 9.6 oz (89.6 kg)  08/03/22 197 lb (89.4 kg)  05/14/22 201 lb (91.2 kg)   Constitutional: overweight, in NAD Eyes:  EOMI, no exophthalmos ENT: no neck masses, no cervical lymphadenopathy Cardiovascular: tachycardia, RR, No MRG Respiratory: CTA B Musculoskeletal: no deformities Skin:no  rashes Neurological: no tremor with outstretched hands  ASSESSMENT: 1. Hypothyroidism  2.  Dysphagia - She has esophageal dysmotility - on PPIs - Sees Dr. Aneita with GI -Patient continues to have bothersome neck symptoms including dysphagia, getting strangled, and voice changes and at last visit she was worried that this may be related to the thyroid .  Her thyroid  surgery was done when she was a child and the symptoms started much later.  Reviewing the report of the CT scan of the neck from 10/2018, this mentions that the thyroid  tissue was diminutive.  There were no masses.  We again discussed that she appears to have had total thyroidectomy.  Based on this report, the thyroid  tissue left behind did not appear to be the culprit for her neck symptoms.  At our visit from 02/2023, we again reviewed her barium swallow report from 11/2018, it appeared that she had severe esophageal dysmotility possibly caused by reflux.  Therefore, symptoms appeared to be functional, rather than structural.  She saw GI (Dr. Aneita) and continues on PPIs.  He mentioned that her dysphagia appears to be stable.  I advised her to discuss with PCP and Dr. Aneita.  PLAN:  1. Patient with longstanding hypothyroidism, on levothyroxine  therapy - latest thyroid  labs reviewed with pt. >> normal: Lab Results  Component Value Date   TSH 0.88 10/31/2023  - she continues on LT4 100 mcg daily - pt feels good on this dose.  At last visit, she was frustrated about inability to lose weight.  She lost 4 pounds before last visit, however.  She was interested in 1 day a week clear liquid fast.  She lost a net 1 pound since last visit.  She has tachycardia at today's visit.  She is on metoprolol .  I advised her to check her heart rate at rest and if consistently above 90, to discussed with her cardiologist. - we discussed about taking the thyroid  hormone every day, with water, >30 minutes before breakfast, separated by >4 hours from acid  reflux medications, calcium , iron, multivitamins. Pt. is taking it correctly, but she has been waiting 4 hours since taking levothyroxine  to take any other medications and because of this she has been missing some of her blood pressure medicines.  I advised her that she only needed to wait 4 hours for the above supplements and the PPIs. - will check thyroid  tests today: TSH and fT4 - If labs are abnormal, she will need to return for repeat TFTs in 1.5 months  Patient Instructions  Please continue Levothyroxine  100 mcg daily.  Take the thyroid  hormone every day, with water, at least 30 minutes before breakfast, separated by at least 4 hours from: - acid reflux medications - calcium  - iron - multivitamins  You can take the BP medications with b'fast.  Please return in 1 year.  Orders Placed This Encounter  Procedures   TSH   T4, free   Needs  refills - 1 year.  Lela Fendt, MD PhD [facility]

## 2024-02-11 NOTE — Patient Instructions (Addendum)
 Please continue Levothyroxine  100 mcg daily.  Take the thyroid  hormone every day, with water, at least 30 minutes before breakfast, separated by at least 4 hours from: - acid reflux medications - calcium  - iron - multivitamins  You can take the BP medications with b'fast.  Please return in 1 year.

## 2024-02-12 ENCOUNTER — Ambulatory Visit: Payer: Self-pay | Admitting: Internal Medicine

## 2024-02-12 MED ORDER — LEVOTHYROXINE SODIUM 88 MCG PO TABS: 88.0000 ug | ORAL_TABLET | Freq: Every day | ORAL | 6 refills | Status: DC

## 2024-02-12 NOTE — Addendum Note (Signed)
 Addended by: TRIXIE FILE on: 02/12/2024 11:18 AM   Modules accepted: Orders

## 2024-03-10 ENCOUNTER — Other Ambulatory Visit: Payer: Self-pay | Admitting: Cardiovascular Disease

## 2024-03-10 DIAGNOSIS — R002 Palpitations: Secondary | ICD-10-CM

## 2024-03-10 DIAGNOSIS — I493 Ventricular premature depolarization: Secondary | ICD-10-CM

## 2024-04-07 ENCOUNTER — Telehealth: Payer: Self-pay | Admitting: Family

## 2024-04-07 NOTE — Telephone Encounter (Signed)
 Left message for patient to call back.  Also sent the information from [place] to her via the patient portal.

## 2024-04-07 NOTE — Telephone Encounter (Signed)
 Pt would like a c/b with advice on herbal medicines and potential medication reactions. Please advise.

## 2024-04-07 NOTE — Telephone Encounter (Signed)
 Triage team - please advise patient that we do not recommend herbal medications as can interfere with her prescription medications and are not well regulated/researched. She is overdue for follow up with Dr. Raford or APP, if she prefers could schedule follow up with a member of the PharmD as well for further review of her medications.   Marilyn [name] S Reagann Dolce, NP

## 2024-04-21 ENCOUNTER — Other Ambulatory Visit

## 2024-04-21 LAB — T4, FREE: Free T4: 1 ng/dL (ref 0.8–1.8)

## 2024-04-21 LAB — TSH: TSH: 5.52 m[IU]/L — ABNORMAL HIGH (ref 0.40–4.50)

## 2024-04-22 ENCOUNTER — Ambulatory Visit: Payer: Self-pay

## 2024-04-22 NOTE — Telephone Encounter (Signed)
 Copied from CRM [id number]. Topic: Clinical - Red Word Triage >> Apr 22, 2024  2:59 PM China J wrote: [facility] that prompted transfer to Nurse Triage: Pain on her right side above her rib cage that keeps coming and going. Her bowel movements and frequency in urination have been affected as well.

## 2024-04-22 NOTE — Telephone Encounter (Signed)
 Called patient, Patient already has appt scheduled with clinic, 04/23/24.

## 2024-04-23 ENCOUNTER — Ambulatory Visit: Admitting: Family Medicine

## 2024-04-23 ENCOUNTER — Other Ambulatory Visit: Payer: Self-pay | Admitting: Internal Medicine

## 2024-04-23 ENCOUNTER — Encounter: Payer: Self-pay | Admitting: Family Medicine

## 2024-04-23 VITALS — BP 134/78 | HR 80 | Temp 97.8°F | Ht 62.0 in | Wt 202.8 lb

## 2024-04-23 DIAGNOSIS — M545 Low back pain, unspecified: Secondary | ICD-10-CM

## 2024-04-23 DIAGNOSIS — R110 Nausea: Secondary | ICD-10-CM

## 2024-04-23 DIAGNOSIS — E890 Postprocedural hypothyroidism: Secondary | ICD-10-CM

## 2024-04-23 DIAGNOSIS — R195 Other fecal abnormalities: Secondary | ICD-10-CM

## 2024-04-23 DIAGNOSIS — R1085 Abdominal pain of multiple sites: Secondary | ICD-10-CM

## 2024-04-23 DIAGNOSIS — K5900 Constipation, unspecified: Secondary | ICD-10-CM

## 2024-04-23 LAB — COMPREHENSIVE METABOLIC PANEL WITH GFR
ALT: 17 U/L (ref 0–35)
AST: 20 U/L (ref 0–37)
Albumin: 4.3 g/dL (ref 3.5–5.2)
Alkaline Phosphatase: 68 U/L (ref 39–117)
BUN: 13 mg/dL (ref 6–23)
CO2: 31 meq/L (ref 19–32)
Calcium: 8.9 mg/dL (ref 8.4–10.5)
Chloride: 101 meq/L (ref 96–112)
Creatinine, Ser: 0.94 mg/dL (ref 0.40–1.20)
GFR: 66.01 mL/min (ref >60.00)
Glucose, Bld: 93 mg/dL (ref 70–99)
Potassium: 3.7 meq/L (ref 3.5–5.1)
Sodium: 138 meq/L (ref 135–145)
Total Bilirubin: 0.4 mg/dL (ref 0.2–1.2)
Total Protein: 8 g/dL (ref 6.0–8.3)

## 2024-04-23 LAB — POCT URINALYSIS DIPSTICK
Bilirubin, UA: NEGATIVE
Blood, UA: NEGATIVE
Glucose, UA: NEGATIVE
Ketones, UA: NEGATIVE
Leukocytes, UA: NEGATIVE
Nitrite, UA: NEGATIVE
Protein, UA: NEGATIVE
Spec Grav, UA: 1.02 (ref 1.010–1.025)
Urobilinogen, UA: 0.2 U/dL
pH, UA: 5.5 (ref 5.0–8.0)

## 2024-04-23 LAB — CBC WITH DIFFERENTIAL/PLATELET
Basophils Absolute: 0 K/uL (ref 0.0–0.1)
Basophils Relative: 0.4 % (ref 0.0–3.0)
Eosinophils Absolute: 0.1 K/uL (ref 0.0–0.7)
Eosinophils Relative: 1.5 % (ref 0.0–5.0)
HCT: 37.5 % (ref 36.0–46.0)
Hemoglobin: 12.3 g/dL (ref 12.0–15.0)
Lymphocytes Relative: 47.2 % — ABNORMAL HIGH (ref 12.0–46.0)
Lymphs Abs: 1.9 K/uL (ref 0.7–4.0)
MCHC: 32.8 g/dL (ref 30.0–36.0)
MCV: 81.4 fl (ref 78.0–100.0)
Monocytes Absolute: 0.3 K/uL (ref 0.1–1.0)
Monocytes Relative: 7.8 % (ref 3.0–12.0)
Neutro Abs: 1.8 K/uL (ref 1.4–7.7)
Neutrophils Relative %: 43.1 % (ref 43.0–77.0)
Platelets: 232 K/uL (ref 150.0–400.0)
RBC: 4.61 Mil/uL (ref 3.87–5.11)
RDW: 14.4 % (ref 11.5–15.5)
WBC: 4.1 K/uL (ref 4.0–10.5)

## 2024-04-23 LAB — LIPASE: Lipase: 62 U/L — ABNORMAL HIGH (ref 11.0–59.0)

## 2024-04-23 LAB — MAGNESIUM: Magnesium: 2.2 mg/dL (ref 1.5–2.5)

## 2024-04-23 LAB — GAMMA GT: GGT: 36 U/L (ref 7–51)

## 2024-04-23 MED ORDER — LACTULOSE 10 GM/15ML PO SOLN: 10.0000 g | Freq: Every day | ORAL | 0 refills | Status: DC | PRN

## 2024-04-23 MED ORDER — LEVOTHYROXINE SODIUM 100 MCG PO TABS: 100.0000 ug | ORAL_TABLET | ORAL | 3 refills | Status: DC

## 2024-04-23 MED ORDER — LEVOTHYROXINE SODIUM 88 MCG PO TABS: 88.0000 ug | ORAL_TABLET | ORAL | 3 refills | Status: DC

## 2024-04-23 NOTE — Progress Notes (Signed)
 Established Patient Office Visit   Subjective  Patient ID: Marilyn [name], female    DOB: 1965/12/14  Age: 58 y.o. MRN: [id number]  Chief Complaint  Patient presents with   Acute Visit    Right side flank pain, started 2 weeks ago, rate of pain 4 out of 10, nausea right arm and shoulder pain, constipation for over a week     Pt is a 58 yo female seen for acute concern.  Pt with R sided upper abdominal pain x 2 wks.  Pain started radiating into R shoulder and posteriorly to R side of back last wk.  Described as a 'gnawing pain' that comes and goes, sometimes feeling like 'hunger pain'. The pain persists even without eating, and she has been cautious with her diet, consuming light foods like crackers and ginger ale to manage nausea. No vomiting or diarrhea, but she reports constipation.  Constipation an issue since September, with a significant decrease in bowel movements from three to four times a day to difficulty passing stools. Her stools are hard, sometimes chalky white, and 'like clay'. She has tried prune juice, aloe vera drinks, figs, and Dulcolax, which provided temporary relief.  Endorses bloating and a heavy feeling in her stomach, especially in the morning.  No vomiting or diarrhea. Reports bloating, gas, and burping, which subsided after using Dulcolax. Experiencing pain when coughing or moving around.  No burning sensation in the stomach this week, but had it last week. She has been less active since being out of work, which she believes contributes to her constipation.  Taking Synthroid , adjusted from 125 mcg to 88 mcg in September. Takes Synthroid  first thing in the morning, separating it from other medications by several hours. Takes amlodipine  and metoprolol  for blood pressure, and a new acid reflux medication twice daily. She is confused about her thyroid  medication dosing.  On Synthroid  since age 23. Her sister also has thyroid  problems and is under the care of the same  doctor.     Patient Active Problem List   Diagnosis Date Noted   Well woman exam with routine gynecological exam 12/16/2023   Intramural and submucous leiomyoma of uterus 12/16/2023   Esophageal stenosis 09/16/2023   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   Hypoxemia associated with sleep 02/23/2022   Pericardial effusion 02/07/2022   Aortic atherosclerosis 02/07/2022   Essential hypertension 07/26/2021   Hyperlipidemia associated with type 2 diabetes mellitus (HCC) 08/04/2020   Diet-controlled type 2 diabetes mellitus (HCC) 07/20/2019   Swollen neck 04/09/2018   Urinary retention 08/05/2017   Atypical chest pain 01/23/2017   Nausea & vomiting 07/16/2015   RUQ abdominal pain 07/16/2015   RLQ abdominal pain 07/15/2015   Pain in lower back 11/30/2014   Acute upper respiratory infection 05/05/2014   Allergic rhinitis 05/05/2014   Hoarseness 05/05/2014   Dysphagia 05/05/2014   Obesity 04/11/2014   Edema, peripheral 04/11/2014   Cough 12/20/2012   Hypersomnolence 12/15/2012   Exertional dyspnea 12/15/2012   Right shoulder pain 12/15/2012   Abdominal pain 11/15/2011   Diabetes mellitus with coincident hypertension (HCC) 09/09/2011   Neck pain 09/09/2011   Preventative health care 04/30/2011   HOT FLASHES 12/22/2009   CHEST PAIN 01/29/2009   Acute non-recurrent maxillary sinusitis 09/25/2008   HYPERTHYROIDISM 01/31/2007   GERD 01/31/2007   Hypothyroidism 01/30/2007   Hyperlipidemia 01/30/2007   Iron deficiency anemia 01/30/2007   ASTHMA 01/30/2007   Past Medical History:  Diagnosis Date   Allergy    ANEMIA-IRON DEFICIENCY  01/30/2007   Aortic atherosclerosis 02/07/2022   Arthritis    ASTHMA 01/30/2007   Asthma    Breast tumor    right; benign   Chest pain 01/24/2017   CHEST PAIN 01/29/2009   Qualifier: Diagnosis of  By: Jimmy MD, Charlie Scarlet    Diabetes mellitus without complication (HCC) 5/21   Essential hypertension 07/26/2021   GERD 01/31/2007   Hemorrhoids     Hiatal hernia    Hyperlipidemia    Hypersomnolence 12/15/2012   HYPOTHYROIDISM 01/30/2007   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   Neck pain 09/09/2011   Obesity 04/11/2014   Pericardial effusion 02/07/2022   Sleep apnea    Past Surgical History:  Procedure Laterality Date   BREAST BIOPSY Right 12/2014   BREAST EXCISIONAL BIOPSY Right 2016   BREAST LUMPECTOMY WITH RADIOACTIVE SEED LOCALIZATION Right 02/25/2015   Procedure: BREAST LUMPECTOMY WITH RADIOACTIVE SEED LOCALIZATION;  Surgeon: Alm Angle, MD;  Location: [facility];  Service: General;  Laterality: Right;   BREAST REDUCTION SURGERY  1986   BREAST SURGERY     broken jaw     CARPAL TUNNEL RELEASE  2001   COLONOSCOPY     FRACTURE SURGERY     REDUCTION MAMMAPLASTY Bilateral    THYROIDECTOMY     TUBAL LIGATION  1987   UPPER GASTROINTESTINAL ENDOSCOPY     Social History   Tobacco Use   Smoking status: Former    Current packs/day: 0.00    Average packs/day: 0.3 packs/day for 3.0 years (0.8 ttl pk-yrs)    Types: Cigarettes    Start date: 06/04/1977    Quit date: 06/04/1980    Years since quitting: 43.9   Smokeless tobacco: Never  Vaping Use   Vaping status: Never Used  Substance Use Topics   Alcohol  use: No    Alcohol /week: 0.0 standard drinks of alcohol    Drug use: No   Family History  Problem Relation Age of Onset   Asthma Mother    Hypertension Father    Diabetes Father    Hyperlipidemia Father    Breast cancer Sister 30   Thyroid  disease Sister    Migraines Daughter    Breast cancer Maternal Aunt    Diabetes Maternal Grandmother    Heart disease Maternal Grandmother    Heart attack Maternal Grandmother 60   Stroke Maternal Grandmother    Asthma Maternal Grandfather    Heart attack Maternal Grandfather    Stroke Maternal Grandfather    Colon cancer Brother    Esophageal cancer Neg Hx    Stomach cancer Neg Hx    Rectal cancer Neg Hx    Allergies  Allergen Reactions   Promethazine -Dm     Tuna [Fish Allergy] Anaphylaxis   Codeine Nausea And Vomiting   Fluticasone  Propionate Other (See Comments)   Promethazine  Other (See Comments)   Atorvastatin      Muscle aches    Flonase  [Fluticasone  Propionate] Other (See Comments)    Nose bleeds    ROS Negative unless stated above    Objective:     LMP 02/21/2013  BP Readings from Last 3 Encounters:  02/11/24 120/80  12/26/23 127/66  12/16/23 122/76   Wt Readings from Last 3 Encounters:  02/11/24 200 lb (90.7 kg)  12/26/23 201 lb (91.2 kg)  12/16/23 198 lb (89.8 kg)      Physical Exam Constitutional:      General: She is not in acute distress.    Appearance: Normal appearance.  HENT:  Head: Normocephalic and atraumatic.     Nose: Nose normal.     Mouth/Throat:     Mouth: Mucous membranes are moist.  Cardiovascular:     Rate and Rhythm: Normal rate and regular rhythm.     Heart sounds: Normal heart sounds. No murmur heard.    No gallop.  Pulmonary:     Effort: Pulmonary effort is normal. No respiratory distress.     Breath sounds: Normal breath sounds. No wheezing, rhonchi or rales.  Abdominal:     General: Bowel sounds are normal.     Palpations: Abdomen is soft.     Tenderness: There is generalized abdominal tenderness. There is no guarding.  Skin:    General: Skin is warm and dry.  Neurological:     Mental Status: She is alert and oriented to person, place, and time.        12/16/2023   10:56 AM 01/24/2023    4:08 PM 01/12/2022   11:54 AM  Depression screen PHQ 2/9  Decreased Interest 0 0 0  Down, Depressed, Hopeless 0 0 0  PHQ - 2 Score 0 0 0  Altered sleeping  0   Tired, decreased energy  0   Change in appetite  0   Feeling bad or failure about yourself   0   Trouble concentrating  0   Moving slowly or fidgety/restless  0   Suicidal thoughts  0   PHQ-9 Score  0    Difficult doing work/chores  Not difficult at all      Data saved with a previous flowsheet row definition       01/24/2023    4:09 PM 08/10/2020    3:58 PM 02/04/2019    6:12 PM  GAD 7 : Generalized Anxiety Score  Nervous, Anxious, on Edge 0 2 1  Control/stop worrying 0 1 3  Worry too much - different things 0 1 3  Trouble relaxing 0 0 2  Restless  0 0  Easily annoyed or irritable 0 3 3  Afraid - awful might happen 0 0 3  Total GAD 7 Score  7 15  Anxiety Difficulty Not difficult at all Somewhat difficult      No results found for any visits on 04/23/24.    Assessment & Plan:   Abdominal pain of multiple sites -     POCT urinalysis dipstick -     Comprehensive metabolic panel with GFR; Future -     CBC with Differential/Platelet; Future -     Lipase; Future -     Gamma GT; Future -     D-dimer, quantitative; Future -     Magnesium ; Future -     US  ABDOMEN LIMITED RUQ (LIVER/GB); Future  Constipation, unspecified constipation type -     Comprehensive metabolic panel with GFR; Future -     CBC with Differential/Platelet; Future -     Magnesium ; Future  Change in stool -     US  ABDOMEN LIMITED RUQ (LIVER/GB); Future  Nausea -     Comprehensive metabolic panel with GFR; Future -     CBC with Differential/Platelet; Future -     US  ABDOMEN LIMITED RUQ (LIVER/GB); Future  Acute right-sided low back pain without sciatica -     POCT urinalysis dipstick  Postsurgical hypothyroidism  Discussed possible causes of current generalized abdominal pain, acholic stools constipation, nausea.  Concern for gallbladder/liver etiology.  Obtain labs and imaging.  Patient advised to schedule follow-up with GI.  Continue Protonix .  Rx for lactulose  constipation.  Advised to use a daily bowel regimen consistently and hydration. Continue current thyroid  medicine levothyroxine  88 mcg daily.  Discussed lowering dose of thyroid  medication was adjusted.  Given strict precautions.   Return if symptoms worsen or fail to improve.   Clotilda JONELLE Single, MD

## 2024-04-24 ENCOUNTER — Telehealth: Payer: Self-pay | Admitting: Family Medicine

## 2024-04-24 ENCOUNTER — Telehealth: Payer: Self-pay | Admitting: Internal Medicine

## 2024-04-24 ENCOUNTER — Telehealth: Payer: Self-pay

## 2024-04-24 ENCOUNTER — Ambulatory Visit: Payer: Self-pay

## 2024-04-24 LAB — D-DIMER, QUANTITATIVE: D-Dimer, Quant: 1.08 ug{FEU}/mL — ABNORMAL HIGH (ref <0.50)

## 2024-04-24 NOTE — Telephone Encounter (Signed)
 The pt was seen by PCP for constipation and US  has been set up for 11/26.  She would like to also be seen at our office for eval. Appt made for 12/9 with Camie.  Offered sooner appt but pt declined due to daughter upcoming surgery.

## 2024-04-24 NOTE — Telephone Encounter (Addendum)
 First attempt to contact patient, no answer, voicemail left to call back, routing for additional attempts.       Reason for Triage: Patient called in regarding needing to speak with nurse, stated she still hasnt made a full bowel movement, yet and would like to know what she needs to do regarding this . Was told she would get relief within 30 mins

## 2024-04-24 NOTE — Telephone Encounter (Signed)
 See other note

## 2024-04-24 NOTE — Telephone Encounter (Signed)
 FYI Only or Action Required?: Action required by provider: clinical question for provider, update on patient condition, and lab or test result follow-up needed.  Patient was last seen in primary care on 04/23/2024 by Marilyn Clotilda SAUNDERS, MD.  Called Nurse Triage reporting Constipation.  Symptoms began 2 weeks ago.  Interventions attempted: Prescription medications: Lactulose .  Symptoms are: gradually improving.  Triage Disposition: See PCP When Office is Open (Within 3 Days)  Patient/caregiver understands and will follow disposition?: No, wishes to speak with PCP       Reason for Disposition  Unable to have a bowel movement (BM) without laxative or enema  Answer Assessment - Initial Assessment Questions Patient seen in the office yesterday. She states she took the prescribed Lactulose  and has only had one small bowel movement. She would like to know if there are any other medications or recommendations to treat her constipation.   Patient would also like a follow up on her lab work when they have been reviewed.       1. STOOL PATTERN OR FREQUENCY: How often do you have a bowel movement (BM)?  (Normal range: 3 times a day to every 3 days)  When was your last BM?       Small bowel movement this morning  2. STRAINING: Do you have to strain to have a BM?      Yes 3. ONSET: When did the constipation begin?     2 weeks  5. BM COMPOSITION: Are the stools hard?      Some hard some soft  6. BLOOD ON STOOLS: Has there been any blood on the toilet tissue or on the surface of the BM? If Yes, ask: When was the last time?     No 8. CHANGES IN DIET OR HYDRATION: Have there been any recent changes in your diet? How much fluids are you drinking on a daily basis?  How much have you had to drink today?     No 9. MEDICINES: Have you been taking any new medicines? Are you taking any narcotic pain medicines? (e.g., Dilaudid , morphine , Percocet, Vicodin)     No 10.  LAXATIVES: Have you been using any stool softeners, laxatives, or enemas?  If Yes, ask What are you using, how often, and when was the last time?       Lactulose  yesterday  11. ACTIVITY:  How much walking do you do every day?  Has your activity level decreased in the past week?        No 12. CAUSE: What do you think is causing the constipation?        Unsure  Protocols used: Constipation-A-AH

## 2024-04-24 NOTE — Telephone Encounter (Signed)
 Copied from CRM #[id number]. Topic: Clinical - Lab/Test Results >> Apr 24, 2024 12:18 PM Lauren C wrote: Reason for CRM: FYI- patient is calling to request someone speaks with her regarding lab results, wanting feedback from Dr. Mercer. She said she wants to make sure she gets both a clinical cytogeneticist message and a call with her recommendations in case she misses one, she is concerned about some of her numbers. I let her know the labs have not been read yet. [phone]

## 2024-04-24 NOTE — Telephone Encounter (Signed)
 Copied from CRM #[id number]. Topic: Clinical - Medication Question >> Apr 24, 2024 12:21 PM Tinnie BROCKS wrote: Reason for CRM: Pt calling regarding lactulose . Does not know if it needs to be refrigerated, does not know how much she can take in a day if it isn't working. Wants to know if its okay to take with other things like calamine tea. Wants to know if she can take half a bottle of citric magnesia. #[phone]

## 2024-04-24 NOTE — Telephone Encounter (Signed)
 Spoke with patient, per Dr. Mercer the patient can take Lactulose  2 times a day and follow-up after.

## 2024-04-24 NOTE — Telephone Encounter (Signed)
 Inbound call from patient she has been having severe abdominal pain and constipation. States she was seen with her PCP yesterday and had tests completed. Patient is wanting to have tests looked at. Requesting a call to discuss further. Please advise, thank you.

## 2024-04-29 ENCOUNTER — Ambulatory Visit
Admission: RE | Admit: 2024-04-29 | Discharge: 2024-04-29 | Disposition: A | Discharge status: Home / Self Care | Source: Ambulatory Visit | Attending: Family Medicine | Admitting: Family Medicine

## 2024-04-29 DIAGNOSIS — R1085 Abdominal pain of multiple sites: Secondary | ICD-10-CM

## 2024-04-29 DIAGNOSIS — R110 Nausea: Secondary | ICD-10-CM

## 2024-04-29 DIAGNOSIS — R195 Other fecal abnormalities: Secondary | ICD-10-CM

## 2024-05-04 ENCOUNTER — Ambulatory Visit (INDEPENDENT_AMBULATORY_CARE_PROVIDER_SITE_OTHER): Admitting: Family

## 2024-05-04 ENCOUNTER — Encounter (HOSPITAL_BASED_OUTPATIENT_CLINIC_OR_DEPARTMENT_OTHER): Payer: Self-pay | Admitting: Family

## 2024-05-04 ENCOUNTER — Encounter (HOSPITAL_BASED_OUTPATIENT_CLINIC_OR_DEPARTMENT_OTHER): Payer: Self-pay | Admitting: Cardiovascular Disease

## 2024-05-04 ENCOUNTER — Ambulatory Visit: Payer: Self-pay | Admitting: Family Medicine

## 2024-05-04 VITALS — BP 118/80 | HR 87 | Ht 63.0 in | Wt 200.5 lb

## 2024-05-04 DIAGNOSIS — E785 Hyperlipidemia, unspecified: Secondary | ICD-10-CM | POA: Diagnosis not present

## 2024-05-04 DIAGNOSIS — K828 Other specified diseases of gallbladder: Secondary | ICD-10-CM

## 2024-05-04 DIAGNOSIS — R1011 Right upper quadrant pain: Secondary | ICD-10-CM

## 2024-05-04 DIAGNOSIS — I700 Atherosclerosis of aorta: Secondary | ICD-10-CM

## 2024-05-04 DIAGNOSIS — G4733 Obstructive sleep apnea (adult) (pediatric): Secondary | ICD-10-CM

## 2024-05-04 DIAGNOSIS — I10 Essential (primary) hypertension: Secondary | ICD-10-CM | POA: Diagnosis not present

## 2024-05-04 MED ORDER — ATORVASTATIN CALCIUM 20 MG PO TABS: 20.0000 mg | ORAL_TABLET | Freq: Every day | ORAL | 3 refills | Status: DC

## 2024-05-04 MED ORDER — ASPIRIN EC 81 MG PO TBEC: 81.0000 mg | DELAYED_RELEASE_TABLET | Freq: Every day | ORAL | 3 refills | Status: AC

## 2024-05-04 NOTE — Progress Notes (Signed)
 Cardiology Office Note:  .   Date:  05/04/2024  ID:  Marilyn [name], DOB [birth year], MRN [id number] PCP: Marilyn Clotilda SAUNDERS, MD  [facility] Providers Cardiologist:  Marilyn Scarce, MD    History of Present Illness: .   Marilyn [name] is a 58 y.o. female with history of HLD, aortic atherosclerosis, HTN, OSA, obesity, coronary artery calcification, hypothyroidism.  Cardiac CTA 04/2020 LAD minimal nonobstructive (1-35%) plaque at take off of the D1 vessel and minimal nonobstructive narrowing (1-25%) in the mid Cx. Sleep study 12/14/21 moderate ODSA AKI 28.5/hr overall and 36.3/hr during REM. O2 as low as 75%. PYp scan 03/2022 equivocal for amyloidosis. Subsequent coronary calcium  score 03/2023 of 0. Previously did not tolerate higher doses of Atorvastatin .  ED visit 05/09/2023 with sharp stabbing right-sided chest pain which improved with omeprazole .  ED workup unremarkable.  Seen 06/07/23 with a myriad of non-cardiac concerns including GERD, headaches, eye drops, postnasal drip.  From a cardiac perspective she was doing well without chest pain.  Prior chest pain attributed to GERD.  Her omeprazole  was transitioned to pantoprazole  and she was encouraged to follow-up with GI as scheduled.  She notes she did not tolerate CPAP and had seen ENT but not discussed inspire.  She is referred to pulmonary per her request for management of sleep apnea.  She contacted the office 12/18/2023 with lower extremity edema and was given 3-day course of Lasix 20 mg daily.  This improved her swelling and she politely declined follow-up visit.  She contacted the office 04/2024 with questions regarding herbal medications and abdominal pain.  She was advised that we do not recommend appropriate medications as can interfere with prescription management are not well-regulated/researched and encouraged to follow-up with primary care provider.  Presents today for follow up independently. She attributes her  chest pain described as a pulled muscle after she takes her medications. This discomfort is immediately after taking her medications. Discussed her medications would not have taken effect that quickly to cause chest pain and Metoprolol /Amlodipine  are actually used to prevent chest pain. She does note some recent GERD with recent dietary indiscretion on Thanksgivin More recent issues with constipation, abdominal bloating for which her primary care provider has made recent changes. Discussed her more recent chest pain is likely GI related and not from her heart.   Her BP at home has been 120s/70s. Occasional readings 130s/80s. No hypotensive BP readings.   She self discontinued her Atorvastatin  over the summer. Of note, she was taking higher dose than prescribed intermittently which she attributes likely caused her myalgias. She is willing to resume Atorvastatin  20mg  daily. Lengthy discussion that natural remedies nor OTC supplements have been proven to adequately lower cholesterol.   Her rhythm strip today shows an occasional PVC. Notes palpitations when she lays down at night. Of note, she describes her heart beating in stomach.   Reports lightheadedness with needing to sit down after her medications. She notes she is not hydrating well and only eating 2 meals per day. Has been encouraged by her PCP to increase fluid intake, agee with this recommendation.   ROS: Please see the history of present illness.    All other systems reviewed and are negative.   Studies Reviewed: SABRA   EKG Interpretation Date/Time:  Monday May 04 2024 10:28:32 EST Ventricular Rate:  87 PR Interval:  204 QRS Duration:  66 QT Interval:  380 QTC Calculation: 457 R Axis:   30  Text Interpretation: Normal sinus rhythm  Low voltage QRS  No acute ST/T wave changes. Confirmed by Marilyn [name] ([id number]) on 05/04/2024 10:42:33 AM    Cardiac Studies & Procedures    ______________________________________________________________________________________________   STRESS TESTS  NM MYOCAR MULTI W/SPECT W 01/24/2017  Narrative CLINICAL DATA:  58 year old female presented number to prior with chest pain. Negative cardiac enzymes. Negative EKG  EXAM: MYOCARDIAL IMAGING WITH SPECT (REST AND PHARMACOLOGIC-STRESS)  GATED LEFT VENTRICULAR WALL MOTION STUDY  LEFT VENTRICULAR EJECTION FRACTION  TECHNIQUE: Standard myocardial SPECT imaging was performed after resting intravenous injection of 10 mCi Tc-40m tetrofosmin . Subsequently, intravenous infusion of Lexiscan  was performed under the supervision of the Cardiology staff. At peak effect of the drug, 30 mCi Tc-4m tetrofosmin  was injected intravenously and standard myocardial SPECT imaging was performed. Quantitative gated imaging was also performed to evaluate left ventricular wall motion, and estimate left ventricular ejection fraction.  COMPARISON:  None.  FINDINGS: EKG stress: No ST changes  Perfusion: There decrease counts in the mid and basilar segment of the inferior wall which are fixed on rest and stress. No foci of reversible ischemia evident. No wall motion abnormality.  Wall Motion: Normal left ventricular wall motion. No left ventricular dilation.  Left Ventricular Ejection Fraction: 61 %  End diastolic volume 79 ml  End systolic volume 30 ml  IMPRESSION: 1. No reversible ischemia. Fixed decrease counts in the inferior wall are favored artifactual, cannot exclude scar.  2. Normal left ventricular wall motion.  3. Left ventricular ejection fraction 61%  4. Non invasive risk stratification*: Low  *2012 Appropriate Use Criteria for Coronary Revascularization Focused Update: J Am Coll Cardiol. 2012;59(9):857-881. http://content.dementiazones.com.aspx?articleid=1201161   Electronically Signed By: Marilyn [name] M.D. On: 01/24/2017 15:58    ECHOCARDIOGRAM  ECHOCARDIOGRAM COMPLETE 06/06/2022  Narrative ECHOCARDIOGRAM REPORT    Patient Name:   Marilyn [name] Date of Exam: 06/06/2022 Medical Rec #:  [id number]              Height:       63.0 in Accession #:    [id number]             Weight:       201.0 lb Date of Birth:  1965/11/10               BSA:          1.938 m Patient Age:    56 years               BP:           126/74 mmHg Patient Gender: F                      HR:           85 bpm. Exam Location:  Outpatient  Procedure: 2D Echo, 3D Echo, Cardiac Doppler, Color Doppler and Strain Analysis  Indications:    Pericardial Effusion  History:        Patient has prior history of Echocardiogram examinations, most recent 08/03/2021. Risk Factors:Diabetes, Hypertension, Dyslipidemia and Former Smoker.  Sonographer:    Orvil Holmes RDCS Referring Phys: [id number] TIFFANY Beaumont  IMPRESSIONS   1. Left ventricular ejection fraction, by estimation, is 65 to 70%. The left ventricle has normal function. The left ventricle has no regional wall motion abnormalities. There is mild concentric left ventricular hypertrophy. Indeterminate diastolic filling due to E-A fusion. 2. Right ventricular systolic function is normal. The right ventricular size is normal. There is normal pulmonary artery systolic pressure.  3. No evidence of mitral valve regurgitation. 4. The aortic valve was not well visualized. Aortic valve regurgitation is not visualized. 5. The inferior vena cava is normal in size with greater than 50% respiratory variability, suggesting right atrial pressure of 3 mmHg.  Comparison(s): No significant change from prior study.  FINDINGS Left Ventricle: Left ventricular ejection fraction, by estimation, is 65 to 70%. The left ventricle has normal function. The left ventricle has no regional wall motion abnormalities. The left ventricular internal cavity size was normal in size. There is mild concentric left ventricular  hypertrophy. Indeterminate diastolic filling due to E-A fusion.  Right Ventricle: The right ventricular size is normal. Right ventricular systolic function is normal. There is normal pulmonary artery systolic pressure. The tricuspid regurgitant velocity is 2.28 m/s, and with an assumed right atrial pressure of 3 mmHg, the estimated right ventricular systolic pressure is 23.8 mmHg.  Left Atrium: Left atrial size was normal in size.  Right Atrium: Right atrial size was normal in size.  Pericardium: Trivial pericardial effusion is present.  Mitral Valve: No evidence of mitral valve regurgitation.  Tricuspid Valve: Tricuspid valve regurgitation is trivial.  Aortic Valve: The aortic valve was not well visualized. Aortic valve regurgitation is not visualized.  Pulmonic Valve: Pulmonic valve regurgitation is not visualized.  Aorta: The aortic root and ascending aorta are structurally normal, with no evidence of dilitation.  Venous: The inferior vena cava is normal in size with greater than 50% respiratory variability, suggesting right atrial pressure of 3 mmHg.  IAS/Shunts: No atrial level shunt detected by color flow Doppler.   LEFT VENTRICLE PLAX 2D LVIDd:         2.89 cm   Diastology LVIDs:         1.74 cm   LV e' medial:    9.50 cm/s LV PW:         1.20 cm   LV E/e' medial:  8.5 LV IVS:        1.24 cm   LV e' lateral:   8.49 cm/s LVOT diam:     2.10 cm   LV E/e' lateral: 9.6 LV SV:         45 LV SV Index:   23 LVOT Area:     3.46 cm   RIGHT VENTRICLE RV Basal diam:  3.02 cm RV Mid diam:    3.61 cm RV S prime:     7.29 cm/s TAPSE (M-mode): 1.0 cm  LEFT ATRIUM             Index        RIGHT ATRIUM          Index LA diam:        4.00 cm 2.06 cm/m   RA Area:     8.23 cm LA Vol (A2C):   39.3 ml 20.28 ml/m  RA Volume:   14.10 ml 7.28 ml/m LA Vol (A4C):   31.3 ml 16.15 ml/m LA Biplane Vol: 35.2 ml 18.17 ml/m AORTIC VALVE LVOT Vmax:   83.80 cm/s LVOT Vmean:  54.000  cm/s LVOT VTI:    0.131 m  AORTA Ao Root diam: 3.00 cm Ao Asc diam:  3.30 cm  MITRAL VALVE               TRICUSPID VALVE MV Area (PHT): 4.06 cm    TR Peak grad:   20.8 mmHg MV Decel Time: 187 msec    TR Vmax:        228.00  cm/s MV E velocity: 81.20 cm/s MV A velocity: 93.80 cm/s  SHUNTS MV E/A ratio:  0.87        Systemic VTI:  0.13 m Systemic Diam: 2.10 cm  Ronal Ross Electronically signed by Ronal Ross Signature Date/Time: 06/06/2022/2:35:55 PM    Final    MONITORS  LONG TERM MONITOR (3-14 DAYS) 12/14/2021  Narrative 14 Day Event Monitor  Quality: Fair.  Baseline artifact. Predominant rhythm: Sinus rhythm Average heart rate: 89 bpm Max heart rate: 140 bpm Min heart rate: 56 bpm Pauses >2.5 seconds: None  Rare PACs.  1.2% PVCs occasional ventricular couplets, ventricular bigeminy and ventricular trigeminy   Tiffany C. Raford, MD, [facility] 12/28/2021 6:02 PM   CT SCANS  CT CARDIAC SCORING (SELF PAY ONLY) 03/28/2023  Addendum 04/18/2023  6:00 PM ADDENDUM REPORT: 04/18/2023 17:58  EXAM: OVER-READ INTERPRETATION  CT CHEST  The following report is an over-read performed by radiologist Dr. Andrea Gasman of [facility] on 04/18/2023. This over-read does not include interpretation of cardiac or coronary anatomy or pathology. The coronary calcium  score interpretation by the cardiologist is attached.  COMPARISON:  Chest CT 03/07/2023  FINDINGS: Vascular: No aortic atherosclerosis. The included aorta is normal in caliber.  Mediastinum/nodes: No adenopathy or mass.  Tiny hiatal hernia.  Lungs: No focal airspace disease. Unchanged 3 mm perifissural lymph node in the right middle lobe, benign needing no further imaging follow-up. No pleural fluid. The included airways are patent.  Upper abdomen: No acute or unexpected findings.  Musculoskeletal: There are no acute or suspicious osseous abnormalities. Scattered degenerative spurring in the  thoracic spine.  IMPRESSION: 1. No acute or unexpected findings. 2. Tiny hiatal hernia.   Electronically Signed By: Andrea Gasman M.D. On: 04/18/2023 17:58  Narrative CLINICAL DATA:  Cardiovascular Disease Risk stratification  EXAM: Coronary Calcium  Score  TECHNIQUE: A gated, non-contrast computed tomography scan of the heart was performed using 3 mm slice thickness. Axial images were analyzed on a dedicated workstation. Calcium  scoring of the coronary arteries was performed using the Agatston method.  FINDINGS: Coronary arteries: Normal origins.  Coronary Calcium  Score:  Left main: 0  Left anterior descending artery: 0  Left circumflex artery: 0  Right coronary artery: 0  Total: 0  Pericardium: Normal.  Aorta: Normal caliber.  Non-cardiac: See separate report from [facility].  IMPRESSION: Coronary calcium  score of 0.  This is a low risk study.  RECOMMENDATIONS: Coronary artery calcium  (CAC) score is a strong predictor of incident coronary heart disease (CHD) and provides predictive information beyond traditional risk factors. CAC scoring is reasonable to use in the decision to withhold, postpone, or initiate statin therapy in intermediate-risk or selected borderline-risk asymptomatic adults (age 9-75 years and LDL-C >=70 to <190 mg/dL) who do not have diabetes or established atherosclerotic cardiovascular disease (ASCVD).* In intermediate-risk (10-year ASCVD risk >=7.5% to <20%) adults or selected borderline-risk (10-year ASCVD risk >=5% to <7.5%) adults in whom a CAC score is measured for the purpose of making a treatment decision the following recommendations have been made:  If CAC=0, it is reasonable to withhold statin therapy and reassess in 5 to 10 years, as long as higher risk conditions are absent (diabetes mellitus, family history of premature CHD in first degree relatives (males <55 years; females <65 years), cigarette  smoking, or LDL >=190 mg/dL).  If CAC is 1 to 99, it is reasonable to initiate statin therapy for patients >=28 years of age.  If CAC is >=100 or >=75th percentile,  it is reasonable to initiate statin therapy at any age.  Cardiology referral should be considered for patients with CAC scores >=400 or >=75th percentile.  *2018 AHA/ACC/AACVPR/AAPA/ABC/ACPM/ADA/AGS/APhA/ASPC/NLA/PCNA Guideline on the Management of Blood Cholesterol: A Report of the American College of Cardiology/American Heart Association Task Force on Clinical Practice Guidelines. J Am Coll Cardiol. 2019;73(24):3168-3209.  Vinie Maxcy, MD  Electronically Signed: By: Vinie JAYSON Maxcy M.D. On: 03/28/2023 20:51   CT SCANS  CT CORONARY MORPH W/CTA COR W/SCORE 04/11/2020  Addendum 04/11/2020  4:57 PM ADDENDUM REPORT: 04/11/2020 16:55  CLINICAL DATA:  58 Year old African American female  EXAM: Cardiac/Coronary  CTA  TECHNIQUE: The patient was scanned on a [employer].  FINDINGS: A 100 kV prospective scan was triggered in the descending thoracic aorta at 111 HU's. Axial non-contrast 3 mm slices were carried out through the heart. The data set was analyzed on a dedicated work station and scored using the Agatson method. Gantry rotation speed was 250 msecs and collimation was .6 mm. No beta blockade and 0.8 mg of sl NTG was given. The 3D data set was reconstructed in 5% intervals of the 67-82 % of the R-R cycle. Diastolic phases were analyzed on a dedicated work station using MPR, MIP and VRT modes. The patient received 80 cc of contrast.  Aorta:  Normal size.  No calcifications.  No dissection.  Aortic Valve:  Tri-leaflet.  No calcifications.  Coronary Arteries:  Normal coronary origin.  Right dominance.  Coronary calcium  score of 0. This was 1st percentile for age, sex, and race matched control.  RCA is a large dominant artery that gives rise to PDA and PLA. There is no plaque.  Left  main is a large artery that gives rise to LAD and LCX arteries.  LAD is a large vessel that gives rise to one large D1 branch with a minimal non-obstructive (1-24%) soft plaque at the take-off of the D1 vessel.  LCX is a non-dominant artery that gives rise to one large OM1 branch. There is a minimal non-obstructive narrowing (1-24%) in the mid vessel.  Other findings:  Normal pulmonary vein drainage into the left atrium.  Normal left atrial appendage without a thrombus.  Normal size of the pulmonary artery.  Extra-cardiac findings: See attached radiology report for non-cardiac structures.  IMPRESSION: 1. Coronary calcium  score of 0. This was 1st percentile for age, sex, and race matched control.  2. Normal coronary origin with right dominance.  3. CAD-RADS 1. 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 Consider non-atherosclerotic causes of chest pain. Consider preventive therapy and risk factor modification.  Stanly Leavens MD   Electronically Signed By: Stanly Leavens MD On: 04/11/2020 16:55  Narrative EXAM: OVER-READ INTERPRETATION  CT CHEST  The following report is an over-read performed by radiologist Dr. Toribio Aye of [facility] on 04/11/2020. This over-read does not include interpretation of cardiac or coronary anatomy or pathology. The coronary calcium  score/coronary CTA interpretation by the cardiologist is attached.  COMPARISON:  None.  FINDINGS: Within the visualized portions of the thorax there are no suspicious appearing pulmonary nodules or masses, there is no acute consolidative airspace disease, no pleural effusions, no pneumothorax and no lymphadenopathy. Visualized portions of the upper abdomen are unremarkable. There are no aggressive appearing lytic or blastic lesions noted in the visualized portions of the skeleton.  IMPRESSION: No significant incidental noncardiac findings are noted.  Electronically  Signed: By: Toribio Aye M.D. On: 04/11/2020 16:08    PYP SCAN  MYOCARDIAL  AMYLOID PLANAR AND SPECT 03/05/2022  Interpretation Summary   By semi-quantitative assessment scan is consistent with increased heart uptake but less than rib uptake-Grade 1. Heart to contralateral lung ratio is between 1-1.5, indeterminate for amyloid.   Study is equivocal for TTR amyloidosis (visual score of 1/ratio between 1-1.5).   Prior study not available for comparison.  Conclusion  Equivocal for TTR amyloidosis (Equivocal: A semi-quantitative visual score of 1 or H/CL ratio 1-1.5)  **If echo/CMR are strongly positive, and negative, consider further evaluation including endomyocardial biopsy  (Note: A negative or mildly positive PYP does not exclude AL amyloid. In addition, equivocal results could represent AL amyloid or early TTR amyloid)  32mTechnetium-Pyrophosphate Imaging  for Transthyretin Cardiac Amyloidosis - [facility] Practice Points, CANDIE Blackbird, et. Al.  ______________________________________________________________________________________________        Risk Assessment/Calculations:         STOP-Bang Score:         Physical Exam:   VS:  BP 118/80 (BP Location: Left Arm, Patient Position: Sitting, Cuff Size: Large)   Pulse 87   Ht 5' 3 (1.6 m)   Wt 200 lb 8 oz (90.9 kg)   LMP 02/21/2013   SpO2 97%   BMI 35.52 kg/m    Wt Readings from Last 3 Encounters:  05/04/24 200 lb 8 oz (90.9 kg)  04/23/24 202 lb 12.8 oz (92 kg)  02/11/24 200 lb (90.7 kg)    GEN: Well nourished, well developed in no acute distress NECK: No JVD; No carotid bruits CARDIAC: RRR, no murmurs, rubs, gallops RESPIRATORY:  Clear to auscultation without rales, wheezing or rhonchi  ABDOMEN: Soft, non-tender, non-distended EXTREMITIES:  No edema; No deformity   ASSESSMENT AND PLAN: .    HTN -  BP well controlled. Continue current antihypertensive regimen Amlodipine  10mg  daily, Metoprolol  succiante 25mg   daily. Discussed to monitor BP at home at least 2 hours after medications and sitting for 5-10 minutes.   Lightheadedness - anticipate related to poor POT intake. No hypotensive BP readings at home. Encouraged to eat and drink regularly to prevent lightheadedness. No indication for echocardiogram at this time as symptoms are intermittent.   Aortic atherosclerosis / HLD, LDL goal <70 - chest pain atypical for angina, as below. Calcium  score 03/2023 0 though known aortic atherosclerosis with LDL goal <70. 10/2023 LDL 177 in setting of independently discontinuing her atorvastatin .  Lengthy discussion regarding the benefit and safety profile of statin.  We discussed at length that over-the-counter remedies and natural agents do not significantly lower cholesterol and it can actually be harmful. She is willing to resume Atorvastatin  20mg  daily. Handout on benefit of statin in women provided. Repeat FLP/ALT in 3 months. If has adverse effect on low dose Atorvastatin , consider Nexlizet.   Chest pain / GERD - Upcoming visit 05/12/24 with GI.  Her recent chest pain is shortly after swallowing her medications before they have been absorbed. Discussed metoprolol /amlodipine  prevent chest pain and do not cause it. Anticipate her present chest pain is GI in nature particularly given her recent abdominal bloating and constipation.  OSA -evaluated by pulmonology 12/26/23. Severe OSA with AHI 28/hr. Did not tolerate CPAP. She was provided a nasal mask, weight loss encouraged, and referred ot dentist for possible dental device. Continue to follow with pulmonology.        Dispo: follow up in 6 months with Dr. Raford  Signed, Reche GORMAN Finder, NP

## 2024-05-04 NOTE — Patient Instructions (Addendum)
 Medication Instructions:  We recommend RESUME Atorvastatin  20mg  once per day  This is to help prevent plaque build up. While you do not have plaque build up in your heart you do in your aorta.   *If you need a refill on your cardiac medications before your next appointment, please call your pharmacy*  Lab Work: Your physician recommends that you return for lab work  if you start the cholesterol medication in 2-3 months for fasting lipid panel, ALT   If you have labs (blood work) drawn today and your tests are completely normal, you will receive your results only by: MyChart Message (if you have MyChart) OR A paper copy in the mail If you have any lab test that is abnormal or we need to change your treatment, we will call you to review the results.  Testing/Procedures: Your EKG today showed normal sinus rhythm which is a good result!  Follow-Up: At [facility], you and your health needs are our priority.  As part of our continuing mission to provide you with exceptional heart care, our providers are all part of one team.  This team includes your primary Cardiologist (physician) and Advanced Practice Providers or APPs (Physician Assistants and Nurse Practitioners) who all work together to provide you with the care you need, when you need it.  Your next appointment:   6 month(s)  Provider:   Annabella Scarce, MD or Reche Finder, NP    We recommend signing up for the patient portal called MyChart.  Sign up information is provided on this After Visit Summary.  MyChart is used to connect with patients for Virtual Visits (Telemedicine).  Patients are able to view lab/test results, encounter notes, upcoming appointments, etc.  Non-urgent messages can be sent to your provider as well.   To learn more about what you can do with MyChart, go to [email or web address].   Other Instructions  Our goal is for your blood pressure to be 110/50-130/80.  Our goal for your heart rate, when  you're sitting and resting, 55bpm -100bpm.   To prevent lightheadedness and dizziness: Make position changes slowly Stay well hydrated Eat regular meals  Natural remedies have not been found to be effective in lowering cholesterol and preventing progression of aortic atherosclerosis. I've linked a good article below for you if you are interested. It found that low dose statin was significant in reducing cholesterol compared to a number of supplements.   relictreasures.se   To prevent palpitations: Make sure you are adequately hydrated.  Avoid and/or limit caffeine containing beverages like soda or tea. Exercise regularly.  Manage stress well. Some over the counter medications can cause palpitations such as Benadryl , AdvilPM, TylenolPM. Regular Advil  or Tylenol  do not cause palpitations.

## 2024-05-12 ENCOUNTER — Ambulatory Visit: Admitting: Gastroenterology

## 2024-05-12 ENCOUNTER — Telehealth: Payer: Self-pay

## 2024-05-12 ENCOUNTER — Encounter: Payer: Self-pay | Admitting: Internal Medicine

## 2024-05-12 ENCOUNTER — Encounter: Payer: Self-pay | Admitting: Gastroenterology

## 2024-05-12 VITALS — BP 138/82 | HR 66 | Ht 63.0 in | Wt 201.5 lb

## 2024-05-12 DIAGNOSIS — Z860100 Personal history of colon polyps, unspecified: Secondary | ICD-10-CM

## 2024-05-12 DIAGNOSIS — R1084 Generalized abdominal pain: Secondary | ICD-10-CM

## 2024-05-12 DIAGNOSIS — K5900 Constipation, unspecified: Secondary | ICD-10-CM

## 2024-05-12 DIAGNOSIS — R194 Change in bowel habit: Secondary | ICD-10-CM

## 2024-05-12 DIAGNOSIS — R141 Gas pain: Secondary | ICD-10-CM

## 2024-05-12 MED ORDER — NA SULFATE-K SULFATE-MG SULF 17.5-3.13-1.6 GM/177ML PO SOLN: 1.0000 | ORAL | 0 refills | Status: DC

## 2024-05-12 NOTE — Telephone Encounter (Signed)
 Request for surgical clearance:     Endoscopy Procedure  What type of surgery is being performed?     Colonoscopy   When is this surgery scheduled?     05/19/2024  What type of clearance is required ?  Cardiac Clearance   Are there any medications that need to be held prior to surgery and how long? None   Practice name and name of physician performing surgery?      [facility]  What is your office phone and fax number?      Phone- [phone]  Fax- [phone]  Anesthesia type (None, local, MAC, general) ?       MAC  Please route your response to Blondie Barks, CMA

## 2024-05-12 NOTE — Patient Instructions (Addendum)
 We have sent the following medications to your pharmacy for you to pick up at your convenience: Suprep   Please purchase the following medications over the counter and take as directed:  Start Miralax  1 capful daily.   Start Benefiber 1 tablespoon once daily.   You have been scheduled for a colonoscopy. Please follow written instructions given to you at your visit today.   If you use inhalers (even only as needed), please bring them with you on the day of your procedure.  DO NOT TAKE 7 DAYS PRIOR TO TEST- Trulicity (dulaglutide) Ozempic, Wegovy (semaglutide) Mounjaro, Zepbound (tirzepatide) Bydureon Bcise (exanatide extended release)  DO NOT TAKE 1 DAY PRIOR TO YOUR TEST Rybelsus (semaglutide) Adlyxin (lixisenatide) Victoza (liraglutide) Byetta (exanatide) ___________________________________________________________________________   Due to recent changes in healthcare laws, you may see the results of your imaging and laboratory studies on MyChart before your provider has had a chance to review them.  We understand that in some cases there may be results that are confusing or concerning to you. Not all laboratory results come back in the same time frame and the provider may be waiting for multiple results in order to interpret others.  Please give us  48 hours in order for your provider to thoroughly review all the results before contacting the office for clarification of your results.   _______________________________________________________  If your blood pressure at your visit was 140/90 or greater, please contact your primary care physician to follow up on this.  _______________________________________________________  If you are age 60 or older, your body mass index should be between 23-30. Your Body mass index is 35.69 kg/m. If this is out of the aforementioned range listed, please consider follow up with your Primary Care Provider.  If you are age 60 or younger, your body  mass index should be between 19-25. Your Body mass index is 35.69 kg/m. If this is out of the aformentioned range listed, please consider follow up with your Primary Care Provider.   ________________________________________________________  The [facility] providers would like to encourage you to use MYCHART to communicate with providers for non-urgent requests or questions.  Due to long hold times on the telephone, sending your provider a message by [facility] may be a faster and more efficient way to get a response.  Please allow 48 business hours for a response.  Please remember that this is for non-urgent requests.  _______________________________________________________  [facility] is using a team-based approach to care.  Your team is made up of your doctor and two to three APPS. Our APPS (Nurse Practitioners and Physician Assistants) work with your physician to ensure care continuity for you. They are fully qualified to address your health concerns and develop a treatment plan. They communicate directly with your gastroenterologist to care for you. Seeing the Advanced Practice Practitioners on your physician's team can help you by facilitating care more promptly, often allowing for earlier appointments, access to diagnostic testing, procedures, and other specialty referrals.   Thank you for choosing me and [facility].  Camie Furbish, PA-C

## 2024-05-12 NOTE — Telephone Encounter (Signed)
   Patient Name: Marilyn [name]  DOB: 1965/06/23 MRN: [id number]  Primary Cardiologist: Annabella Scarce, MD  Chart reviewed as part of pre-operative protocol coverage. Given past medical history and time since last visit, based on ACC/AHA guidelines, Marilyn [name] is at acceptable risk for the planned procedure without further cardiovascular testing. She was last seen 05/04/24 doing well from a cardiac perspective and will follow up with cardiology in 6 months. If needed, she may hold Aspirin  7 days prior to procedure.  I will route this recommendation to the requesting party via Epic fax function and remove from pre-op pool.  Please call with questions.  Reche GORMAN Finder, NP 05/12/2024, 4:02 PM
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
## 2024-05-18 LAB — OPHTHALMOLOGY REPORT-SCANNED

## 2024-05-19 ENCOUNTER — Ambulatory Visit: Admitting: Internal Medicine

## 2024-05-19 ENCOUNTER — Encounter: Payer: Self-pay | Admitting: Internal Medicine

## 2024-05-19 VITALS — BP 114/73 | HR 74 | Temp 97.7°F | Resp 17 | Ht 63.0 in | Wt 201.0 lb

## 2024-05-19 DIAGNOSIS — K529 Noninfective gastroenteritis and colitis, unspecified: Secondary | ICD-10-CM | POA: Diagnosis not present

## 2024-05-19 DIAGNOSIS — R194 Change in bowel habit: Secondary | ICD-10-CM

## 2024-05-19 MED ORDER — SODIUM CHLORIDE 0.9 % IV SOLN: 500.0000 mL | INTRAVENOUS | Status: DC

## 2024-05-19 NOTE — Op Note (Signed)
 [facility] Patient Name: Marilyn [name] Procedure Date: 05/19/2024 8:41 AM MRN: [id number] Endoscopist: Lupita FORBES Commander , MD, [id number] Age: 58 Referring MD:  Date of Birth: Nov 07, 1965 Gender: Female Account #: [id number] Procedure:                Colonoscopy Indications:              Change in bowel habits Medicines:                Monitored Anesthesia Care Procedure:                Pre-Anesthesia Assessment:                           - Prior to the procedure, a History and Physical                            was performed, and patient medications and                            allergies were reviewed. The patient's tolerance of                            previous anesthesia was also reviewed. The risks                            and benefits of the procedure and the sedation                            options and risks were discussed with the patient.                            All questions were answered, and informed consent                            was obtained. Prior Anticoagulants: The patient has                            taken no anticoagulant or antiplatelet agents. ASA                            Grade Assessment: II - A patient with mild systemic                            disease. After reviewing the risks and benefits,                            the patient was deemed in satisfactory condition to                            undergo the procedure.                           After obtaining informed consent, the colonoscope  was passed under direct vision. Throughout the                            procedure, the patient's blood pressure, pulse, and                            oxygen saturations were monitored continuously. The                            CF [id number] #[id number] was introduced through the anus                            and advanced to the the terminal ileum, with                            identification of the appendiceal  orifice and IC                            valve. The colonoscopy was performed without                            difficulty. The patient tolerated the procedure                            well. The quality of the bowel preparation was                            excellent. The terminal ileum, ileocecal valve,                            appendiceal orifice, and rectum were photographed.                            The bowel preparation used was SUPREP via split                            dose instruction. Scope In: 9:13:49 AM Scope Out: 9:26:28 AM Scope Withdrawal Time: 0 hours 10 minutes 0 seconds  Total Procedure Duration: 0 hours 12 minutes 39 seconds  Findings:                 The perianal and digital rectal examinations were                            normal.                           A diffuse area of ulcerated mucosa was found in the                            ascending colon and in the cecum. Biopsies were                            taken with a cold forceps for histology.  Verification of patient identification for the                            specimen was done. Estimated blood loss was minimal.                           The terminal ileum appeared normal.                           External and internal hemorrhoids were found during                            retroflexion.                           The exam was otherwise without abnormality on                            direct and retroflexion views. Complications:            No immediate complications. Estimated Blood Loss:     Estimated blood loss was minimal. Impression:               - Ulcerated mucosa in the ascending colon and in                            the cecum. Biopsied. Aphthous ulcers - same                            distribution as last year. Perhaps she does have                            inflammatory bowel disease.                           - The examined portion of the ileum was  normal.                           - External and internal hemorrhoids.                           - The examination was otherwise normal on direct                            and retroflexion views. Recommendation:           - Patient has a contact number available for                            emergencies. The signs and symptoms of potential                            delayed complications were discussed with the                            patient. Return to normal activities tomorrow.  Written discharge instructions were provided to the                            patient.                           - Resume previous diet.                           - Continue present medications.                           - Await pathology results.                           - Repeat colonoscopy in 5 years - brother had colon                            cancer Lupita FORBES Commander, MD 05/19/2024 9:36:44 AM This report has been signed electronically.

## 2024-05-19 NOTE — Patient Instructions (Addendum)
 There is inflammation in the beginning of the colon again.  Not severe mild like Dr. Aneita saw last year.  I took biopsies again.  When I get these results I will contact you and we will figure out a plan.  No polyps or cancer were seen.  I appreciate the opportunity to care for you. Marilyn CHARLENA Commander, MD, FACG    YOU HAD AN ENDOSCOPIC PROCEDURE TODAY AT THE [facility]:   Refer to the procedure report that was given to you for any specific questions about what was found during the examination.  If the procedure report does not answer your questions, please call your gastroenterologist to clarify.  If you requested that your care partner not be given the details of your procedure findings, then the procedure report has been included in a sealed envelope for you to review at your convenience later.  YOU SHOULD EXPECT: Some feelings of bloating in the abdomen. Passage of more gas than usual.  Walking can help get rid of the air that was put into your GI tract during the procedure and reduce the bloating. If you had a lower endoscopy (such as a colonoscopy or flexible sigmoidoscopy) you may notice spotting of blood in your stool or on the toilet paper. If you underwent a bowel prep for your procedure, you may not have a normal bowel movement for a few days.  Please Note:  You might notice some irritation and congestion in your nose or some drainage.  This is from the oxygen used during your procedure.  There is no need for concern and it should clear up in a day or so.  SYMPTOMS TO REPORT IMMEDIATELY:  Following lower endoscopy (colonoscopy or flexible sigmoidoscopy):  Excessive amounts of blood in the stool  Significant tenderness or worsening of abdominal pains  Swelling of the abdomen that is new, acute  Fever of 100F or higher  For urgent or emergent issues, a gastroenterologist can be reached at any hour by calling [phone]. Do not use MyChart messaging for urgent concerns.     DIET:  We do recommend a small meal at first, but then you may proceed to your regular diet.  Drink plenty of fluids but you should avoid alcoholic beverages for 24 hours.  ACTIVITY:  You should plan to take it easy for the rest of today and you should NOT DRIVE or use heavy machinery until tomorrow (because of the sedation medicines used during the test).    FOLLOW UP: Our staff will call the number listed on your records the next business day following your procedure.  We will call around 7:15- 8:00 am to check on you and address any questions or concerns that you may have regarding the information given to you following your procedure. If we do not reach you, we will leave a message.     If any biopsies were taken you will be contacted by phone or by letter within the next 1-3 weeks.  Please call us  at [phone] if you have not heard about the biopsies in 3 weeks.    SIGNATURES/CONFIDENTIALITY: You and/or your care partner have signed paperwork which will be entered into your electronic medical record.  These signatures attest to the fact that that the information above on your After Visit Summary has been reviewed and is understood.  Full responsibility of the confidentiality of this discharge information lies with you and/or your care-partner.

## 2024-05-19 NOTE — Progress Notes (Signed)
 Pt's states no medical or surgical changes since previsit or office visit.

## 2024-05-19 NOTE — Progress Notes (Signed)
 Called to room to assist during endoscopic procedure.  Patient ID and intended procedure confirmed with present staff. Received instructions for my participation in the procedure from the performing physician.

## 2024-05-19 NOTE — Progress Notes (Signed)
 Report to PACU, RN, vss, BBS= Clear.

## 2024-05-19 NOTE — Progress Notes (Signed)
 History and Physical Interval Note:  05/19/2024 8:59 AM  Marilyn [name] Nose  has presented today for endoscopic procedure(s), with the diagnosis of  Encounter Diagnosis  Name Primary?   Change in bowel habits Yes  .  The various methods of evaluation and treatment have been discussed with the patient and/or family. After consideration of risks, benefits and other options for treatment, the patient has consented to  the endoscopic procedure(s).   The patient's history has been reviewed, patient examined, no change in status, stable for endoscopic procedure(s).  I have reviewed the patient's chart and labs.  Questions were answered to the patient's satisfaction.     [name], MD, NOLIA

## 2024-05-20 ENCOUNTER — Telehealth: Payer: Self-pay | Admitting: Lactation Services

## 2024-05-20 NOTE — Telephone Encounter (Signed)
°  Follow up Call-     05/19/2024    8:04 AM 09/17/2023    8:47 AM 08/23/2022   10:45 AM  Call back number  Post procedure Call Back phone  # [phone]  Permission to leave phone message Yes Yes Yes     Patient questions:  Do you have a fever, pain , or abdominal swelling? No. Pain Score  0 *  Have you tolerated food without any problems? Yes.    Have you been able to return to your normal activities? Yes.    Do you have any questions about your discharge instructions: Diet   No. Medications  No. Follow up visit  No.  Do you have questions or concerns about your Care? No.  Actions: * If pain score is 4 or above: No action needed, pain <4.

## 2024-05-21 LAB — SURGICAL PATHOLOGY

## 2024-05-22 ENCOUNTER — Other Ambulatory Visit: Payer: Self-pay | Admitting: Family Medicine

## 2024-05-22 DIAGNOSIS — R0602 Shortness of breath: Secondary | ICD-10-CM

## 2024-05-22 MED ORDER — ALBUTEROL SULFATE HFA 108 (90 BASE) MCG/ACT IN AERS: 1.0000 | INHALATION_SPRAY | Freq: Four times a day (QID) | RESPIRATORY_TRACT | 0 refills | Status: AC | PRN

## 2024-05-26 ENCOUNTER — Telehealth: Payer: Self-pay | Admitting: Internal Medicine

## 2024-05-26 DIAGNOSIS — K50118 Crohn's disease of large intestine with other complication: Secondary | ICD-10-CM

## 2024-05-26 DIAGNOSIS — K5010 Crohn's disease of large intestine without complications: Secondary | ICD-10-CM | POA: Insufficient documentation

## 2024-05-26 MED ORDER — MESALAMINE 1.2 G PO TBEC: 1.2000 g | DELAYED_RELEASE_TABLET | Freq: Every day | ORAL | 3 refills | Status: DC

## 2024-05-26 MED ORDER — DICYCLOMINE HCL 20 MG PO TABS: 20.0000 mg | ORAL_TABLET | Freq: Four times a day (QID) | ORAL | 1 refills | Status: DC | PRN

## 2024-05-26 NOTE — Telephone Encounter (Signed)
 I called the patient with results.  It looks like she has Crohn's colitis (at least).  She is still experiencing crampy abdominal pain.  Heartburn also.  She is to do the following:  Come to the lab for fecal calprotectin baseline  Start generic Lialda  2.4 g daily  Dicyclomine  20 mg every 6 hours as needed abdominal pain  Go back on pantoprazole  40 mg twice daily  Use ondansetron  as needed for nausea.  She has a prescription.  She will let me know if she needs refills.   She needs a next available appointment with me, February should be fine.  You may contact her by MyChart to set this up.  Meds ordered this encounter  Medications   mesalamine  (LIALDA ) 1.2 g EC tablet    Sig: Take 1 tablet (1.2 g total) by mouth daily with breakfast.    Dispense:  60 tablet    Refill:  3   dicyclomine  (BENTYL ) 20 MG tablet    Sig: Take 1 tablet (20 mg total) by mouth every 6 (six) hours as needed for spasms.    Dispense:  60 tablet    Refill:  1

## 2024-05-26 NOTE — Telephone Encounter (Signed)
 This pt had a procedure a week ago. This needs to be routed to the office staff for further recommendations from the MD.

## 2024-05-26 NOTE — Telephone Encounter (Signed)
 Spoke w pt about symptoms she is experiencing . Pt stated she is having stomach pain and due to having ulcer she is unsure of what to take.  Pt requesting call back for advice. Please advise thank you

## 2024-05-26 NOTE — Telephone Encounter (Signed)
 07/08/24 at 1050 am appt made with Dr Avram. Pt advised via My Chart

## 2024-05-27 NOTE — Telephone Encounter (Signed)
Inbound call from patient requesting a call back to discuss medication questions. Please advise. 

## 2024-05-29 ENCOUNTER — Encounter (HOSPITAL_BASED_OUTPATIENT_CLINIC_OR_DEPARTMENT_OTHER): Payer: Self-pay

## 2024-05-29 ENCOUNTER — Other Ambulatory Visit: Payer: Self-pay

## 2024-05-29 ENCOUNTER — Emergency Department (HOSPITAL_BASED_OUTPATIENT_CLINIC_OR_DEPARTMENT_OTHER)

## 2024-05-29 DIAGNOSIS — E119 Type 2 diabetes mellitus without complications: Secondary | ICD-10-CM | POA: Diagnosis not present

## 2024-05-29 DIAGNOSIS — I10 Essential (primary) hypertension: Secondary | ICD-10-CM | POA: Insufficient documentation

## 2024-05-29 DIAGNOSIS — R059 Cough, unspecified: Secondary | ICD-10-CM | POA: Diagnosis present

## 2024-05-29 DIAGNOSIS — Z79899 Other long term (current) drug therapy: Secondary | ICD-10-CM | POA: Insufficient documentation

## 2024-05-29 DIAGNOSIS — I493 Ventricular premature depolarization: Secondary | ICD-10-CM | POA: Insufficient documentation

## 2024-05-29 DIAGNOSIS — Z7982 Long term (current) use of aspirin: Secondary | ICD-10-CM | POA: Insufficient documentation

## 2024-05-29 DIAGNOSIS — R0602 Shortness of breath: Secondary | ICD-10-CM | POA: Diagnosis not present

## 2024-05-29 DIAGNOSIS — J069 Acute upper respiratory infection, unspecified: Secondary | ICD-10-CM | POA: Insufficient documentation

## 2024-05-29 LAB — CBC
HCT: 36.8 % (ref 36.0–46.0)
Hemoglobin: 12.1 g/dL (ref 12.0–15.0)
MCH: 26.9 pg (ref 26.0–34.0)
MCHC: 32.9 g/dL (ref 30.0–36.0)
MCV: 81.8 fL (ref 80.0–100.0)
Platelets: 220 K/uL (ref 150–400)
RBC: 4.5 MIL/uL (ref 3.87–5.11)
RDW: 13.5 % (ref 11.5–15.5)
WBC: 5 K/uL (ref 4.0–10.5)
nRBC: 0 % (ref 0.0–0.2)

## 2024-05-29 LAB — TROPONIN T, HIGH SENSITIVITY: Troponin T High Sensitivity: <15 ng/L (ref 0–19)

## 2024-05-29 LAB — BASIC METABOLIC PANEL WITH GFR
Anion gap: 10 (ref 5–15)
BUN: 13 mg/dL (ref 6–20)
CO2: 27 mmol/L (ref 22–32)
Calcium: 8.8 mg/dL — ABNORMAL LOW (ref 8.9–10.3)
Chloride: 102 mmol/L (ref 98–111)
Creatinine, Ser: 0.96 mg/dL (ref 0.44–1.00)
GFR, Estimated: >60 mL/min
Glucose, Bld: 104 mg/dL — ABNORMAL HIGH (ref 70–99)
Potassium: 3.8 mmol/L (ref 3.5–5.1)
Sodium: 139 mmol/L (ref 135–145)

## 2024-05-29 NOTE — Telephone Encounter (Signed)
 Cardiology put her on ASA 81 mg. Advised to never take it on an empty stomach.  Okay to continue ASA?

## 2024-05-29 NOTE — ED Triage Notes (Signed)
 Pt states that she was shopping today at approx 1500 when she had sudden onset of midsternal chest pain that has been intermittent. Pt states that she has had similar feeling in past and was told it was anxiety. Pt states that pain radiated to left arm when pain began but has since subsided.

## 2024-05-29 NOTE — Telephone Encounter (Signed)
 OK to stay on 81 mg ASA and it can be taken on an empty stomach if necessary

## 2024-05-29 NOTE — Telephone Encounter (Signed)
 Pt notified via mychart

## 2024-05-30 ENCOUNTER — Emergency Department (HOSPITAL_BASED_OUTPATIENT_CLINIC_OR_DEPARTMENT_OTHER)

## 2024-05-30 ENCOUNTER — Emergency Department (HOSPITAL_BASED_OUTPATIENT_CLINIC_OR_DEPARTMENT_OTHER)
Admission: EM | Admit: 2024-05-30 | Discharge: 2024-05-30 | Disposition: A | Discharge status: Home / Self Care | Attending: Emergency Medicine | Admitting: Emergency Medicine

## 2024-05-30 DIAGNOSIS — I493 Ventricular premature depolarization: Secondary | ICD-10-CM

## 2024-05-30 DIAGNOSIS — J069 Acute upper respiratory infection, unspecified: Secondary | ICD-10-CM

## 2024-05-30 DIAGNOSIS — R079 Chest pain, unspecified: Secondary | ICD-10-CM

## 2024-05-30 LAB — RESP PANEL BY RT-PCR (RSV, FLU A&B, COVID)  RVPGX2
Influenza A by PCR: NEGATIVE
Influenza B by PCR: NEGATIVE
Resp Syncytial Virus by PCR: NEGATIVE
SARS Coronavirus 2 by RT PCR: NEGATIVE

## 2024-05-30 LAB — D-DIMER, QUANTITATIVE: D-Dimer, Quant: 0.84 ug{FEU}/mL — ABNORMAL HIGH (ref 0.00–0.50)

## 2024-05-30 LAB — PRO BRAIN NATRIURETIC PEPTIDE: Pro Brain Natriuretic Peptide: <50 pg/mL

## 2024-05-30 LAB — TROPONIN T, HIGH SENSITIVITY: Troponin T High Sensitivity: <15 ng/L (ref 0–19)

## 2024-05-30 MED ORDER — IOHEXOL 350 MG/ML SOLN
75.0000 mL | Freq: Once | INTRAVENOUS | Status: AC | PRN
  Administered 2024-05-30: 75 mL via INTRAVENOUS

## 2024-05-30 MED ORDER — BENZONATATE 100 MG PO CAPS: 100.0000 mg | ORAL_CAPSULE | Freq: Three times a day (TID) | ORAL | 0 refills | Status: DC

## 2024-05-30 NOTE — Discharge Instructions (Addendum)
 You were seen for your upper respiratory tract infection in the emergency department.   Talk to your GI doctor about using tylenol   for your pain. Please use over-the-counter cough medication or tea with honey for your cough.  Follow-up with your primary doctor in 2-3 days regarding your visit.  This may be over the phone. Talk to your cardiologist about your chest pain and premature ventricular contractions (PVCs).   Return immediately to the emergency department if you experience any of the following: Difficulty breathing, or any other concerning symptoms.    Thank you for visiting our Emergency Department. It was a pleasure taking care of you today.

## 2024-05-30 NOTE — ED Provider Notes (Signed)
" °  Physical Exam  BP 126/73   Pulse 66   Temp 98.3 F (36.8 C)   Resp (!) 27   LMP 02/21/2013   SpO2 96%   Physical Exam  Procedures  Procedures  ED Course / MDM   Clinical Course as of 05/30/24 1801  Sat May 30, 2024  0743 Assumed care from Dr Jerrol. 58 yo F who presented with L sided chest pain and cough. EKG and troponin reassuring. Dimer elevated and getting CTA. Covid and flu negative.  [RP]  0910 CTA without PE.  No pericardial effusion.  Patient reassessed.  She is satting 100% on room air.  She is overall well-appearing.  Informed of the findings of her CT scan.  Just saw cardiology recently with a reassuring evaluation.  Will have her follow-up with her primary doctor in several days. [RP]    Clinical Course User Index [RP] Yolande Lamar BROCKS, MD   Medical Decision Making Amount and/or Complexity of Data Reviewed Labs: ordered. Radiology: ordered.  Risk Prescription drug management.      Yolande Lamar BROCKS, MD 05/30/24 [phone]  "

## 2024-05-30 NOTE — ED Provider Notes (Signed)
 " [place] [facility] AT [facility] Provider Note   CSN: [id number] Arrival date & time: 05/29/24  2211     Patient presents with: Chest Pain   Marilyn [name] is a 58 y.o. female.    Chest Pain Associated symptoms: cough and shortness of breath      58 year old female with medical history significant for HLD, HTN, GERD, hiatal hernia, obesity, pericardial effusion, diabetes mellitus who presents to the emergency department with a cough, midsternal chest pain and mild shortness of breath.  Patient states that symptoms came on yesterday around 1500 and were sudden in onset.  She has had a similar feeling in the past and been told it was anxiousness.  Pain radiated to left arm and has since subsided.  She has been coughing all night.  She states that it is a dry cough.  She denies any sick contacts.  No fevers or chills. She is not on anticoagulation.  Prior to Admission medications  Medication Sig Start Date End Date Taking? Authorizing Provider  Accu-Chek Softclix Lancets lancets Test three times a day 11/26/23   Mercer Clotilda SAUNDERS, MD  acetaminophen  (TYLENOL ) 500 MG tablet Take 1 tablet (500 mg total) by mouth every 6 (six) hours as needed. 06/07/22   Charlyn Sora, MD  albuterol  (VENTOLIN  HFA) 108 (90 Base) MCG/ACT inhaler Inhale 1-2 puffs into the lungs every 6 (six) hours as needed for wheezing or shortness of breath. 05/22/24   Mercer Clotilda SAUNDERS, MD  amLODipine  (NORVASC ) 10 MG tablet TAKE 1 TABLET BY MOUTH DAILY 11/11/23   Mercer Clotilda SAUNDERS, MD  aspirin  EC 81 MG tablet Take 1 tablet (81 mg total) by mouth daily. Swallow whole. 05/04/24   Vannie Reche RAMAN, NP  atorvastatin  (LIPITOR) 20 MG tablet Take 1 tablet (20 mg total) by mouth daily. 05/04/24   Walker, Caitlin S, NP  Blood Glucose Monitoring Suppl (ACCU-CHEK GUIDE) w/Device KIT TEST THREE TIMES A DAY 11/15/23   Mercer Clotilda SAUNDERS, MD  Blood Pressure Monitoring (COMFORT TOUCH BP CUFF/LARGE) MISC Please measure  blood pressure 1 time per day 1-2 hours after taking medication and resting for 5-10 minutes, please keep a log and bring to all future appointments. DX I10 12/18/21   Raford Riggs, MD  budesonide -formoterol  (SYMBICORT ) 80-4.5 MCG/ACT inhaler Inhale 1-2 puffs into the lungs 2 (two) times daily. 09/05/23   Lucius Krabbe, NP  cyclobenzaprine  (FLEXERIL ) 5 MG tablet Take 1 tablet (5 mg total) by mouth 3 (three) times daily as needed. 12/27/17   Christopher Savannah, PA-C  dicyclomine  (BENTYL ) 20 MG tablet Take 1 tablet (20 mg total) by mouth every 6 (six) hours as needed for spasms. 05/26/24   Avram Lupita BRAVO, MD  EPINEPHrine  (EPIPEN  2-PAK) 0.3 mg/0.3 mL IJ SOAJ injection Inject 0.3 mg into the muscle as needed for anaphylaxis. 08/09/23   Mercer Clotilda SAUNDERS, MD  glucose blood test strip Test three times a day 11/26/23   Mercer Clotilda SAUNDERS, MD  levocetirizine (XYZAL  ALLERGY 24HR) 5 MG tablet Take 1 tablet (5 mg total) by mouth every evening. 10/31/23   Soldatova, Liuba, MD  levothyroxine  (SYNTHROID ) 100 MCG tablet Take 1 tablet (100 mcg total) by mouth every other day. 04/23/24   Trixie File, MD  levothyroxine  (SYNTHROID ) 88 MCG tablet Take 1 tablet (88 mcg total) by mouth every other day. 04/23/24   Trixie File, MD  mesalamine  (LIALDA ) 1.2 g EC tablet Take 1 tablet (1.2 g total) by mouth daily with breakfast. 05/26/24  Avram Lupita BRAVO, MD  metoprolol  succinate (TOPROL -XL) 25 MG 24 hr tablet TAKE 1 TABLET BY MOUTH DAILY 03/11/24   Walker, Caitlin S, NP  ondansetron  (ZOFRAN ) 4 MG tablet Take 1 tablet (4 mg total) by mouth every 8 (eight) hours as needed for nausea or vomiting. 01/24/23   Mercer Clotilda SAUNDERS, MD  pantoprazole  (PROTONIX ) 40 MG tablet Take 1 tablet (40 mg total) by mouth 2 (two) times daily. 06/07/23   Walker, Caitlin S, NP  vitamin B-12 (CYANOCOBALAMIN ) 500 MCG tablet Take 500 mcg by mouth daily.    [provider]  Vitamin D -Vitamin K (VITAMIN K2-VITAMIN D3 PO) Take 1 capsule by mouth  daily.    [provider]  Wheat Dextrin PATIENCE) POWD Use as directed, daily 09/16/23   Zehr, Jessica D, PA-C    Allergies: Promethazine -dm, Richelle gums allergy], Atorvastatin , Codeine, Lactulose , and Flonase  [fluticasone  propionate]    Review of Systems  Respiratory:  Positive for cough, chest tightness and shortness of breath.   Cardiovascular:  Positive for chest pain.  All other systems reviewed and are negative.   Updated Vital Signs BP 135/74 (BP Location: Right Arm)   Pulse 65   Temp 97.9 F (36.6 C) (Oral)   Resp 15   LMP 02/21/2013   SpO2 100%   Physical Exam Vitals and nursing note reviewed.  Constitutional:      General: She is not in acute distress.    Appearance: She is well-developed.  HENT:     Head: Normocephalic and atraumatic.  Eyes:     Conjunctiva/sclera: Conjunctivae normal.  Cardiovascular:     Rate and Rhythm: Normal rate and regular rhythm.     Heart sounds: No murmur heard. Pulmonary:     Effort: Pulmonary effort is normal. No respiratory distress.     Breath sounds: Normal breath sounds.  Abdominal:     Palpations: Abdomen is soft.     Tenderness: There is no abdominal tenderness.  Musculoskeletal:        General: No swelling.     Cervical back: Neck supple.  Skin:    General: Skin is warm and dry.     Capillary Refill: Capillary refill takes less than 2 seconds.  Neurological:     Mental Status: She is alert.  Psychiatric:        Mood and Affect: Mood normal.     (all labs ordered are listed, but only abnormal results are displayed) Labs Reviewed  BASIC METABOLIC PANEL WITH GFR - Abnormal; Notable for the following components:      Result Value   Glucose, Bld 104 (*)    Calcium  8.8 (*)    All other components within normal limits  RESP PANEL BY RT-PCR (RSV, FLU A&B, COVID)  RVPGX2  CBC  D-DIMER, QUANTITATIVE  PRO BRAIN NATRIURETIC PEPTIDE  TROPONIN T, HIGH SENSITIVITY  TROPONIN T, HIGH SENSITIVITY    EKG: EKG  Interpretation Date/Time:  Friday May 29 2024 22:17:53 EST Ventricular Rate:  99 PR Interval:  200 QRS Duration:  70 QT Interval:  370 QTC Calculation: 474 R Axis:   69  Text Interpretation: Sinus rhythm with frequent Premature ventricular complexes Low voltage QRS Borderline ECG When compared with ECG of 04-May-2024 10:28, Premature ventricular complexes are now Present Confirmed by Jerrol Agent ([id number]) on 05/30/2024 4:39:40 AM  Radiology: [facility] Chest Port 1 View Result Date: 05/29/2024 EXAM: 1 VIEW(S) XRAY OF THE CHEST 05/29/2024 11:33:00 PM COMPARISON: Chest x-ray 05/09/2023, CT heart 03/28/2023. CLINICAL HISTORY: Chest  pain. FINDINGS: LUNGS AND PLEURA: No focal pulmonary opacity. No pleural effusion. No pneumothorax. HEART AND MEDIASTINUM: No acute abnormality of the cardiac and mediastinal silhouettes. BONES AND SOFT TISSUES: Degenerative changes of spine. IMPRESSION: 1. No acute cardiopulmonary abnormality. Electronically signed by: Morgane Naveau MD 05/29/2024 11:47 PM EST RP Workstation: [id number]     Procedures   Medications Ordered in the ED - No data to display                                  Medical Decision Making Amount and/or Complexity of Data Reviewed Labs: ordered.    58 year old female with medical history significant for HLD, HTN, GERD, hiatal hernia, obesity, pericardial effusion, diabetes mellitus who presents to the emergency department with a cough, midsternal chest pain and mild shortness of breath.  Patient states that symptoms came on yesterday around 1500 and were sudden in onset.  She has had a similar feeling in the past and been told it was anxiousness.  Pain radiated to left arm and has since subsided.  She has been coughing all night.  She states that it is a dry cough.  She denies any sick contacts.  No fevers or chills.  She is not on anticoagulation.  On arrival, the patient was afebrile, not tachycardic or tachypneic, hemodynamically stable,  saturating well on room air.  Patient presenting with a dry cough, shortness of breath as well as chest discomfort.  Differential diagnose includes viral infection, pulmonary embolism, pericarditis, pericardial effusion, ACS, pneumonia, pneumothorax.  EKG was obtained: Sinus rhythm, ventricular rate 99, frequent PVCs present, no STEMI.  CXR: No acute cardiopulmonary abnormality.  Labs: CBC without a leukocytosis or anemia, BMP unremarkable, cardiac troponins x 2 negative.  In the setting the patient's symptoms, will obtain D-dimer, proBNP as well as COVID and flu and RSV PCR testing.  At time of signout to follow-up results of laboratory evaluation, reassess the patient, ultimate disposition pending results of diagnostic testing and reassessment.  Signout given to Dr. Jakie at 0 700.     Final diagnoses:  None    ED Discharge Orders     None          Jerrol Agent, MD 05/30/24 [phone]  "

## 2024-06-02 ENCOUNTER — Other Ambulatory Visit

## 2024-06-03 ENCOUNTER — Other Ambulatory Visit (INDEPENDENT_AMBULATORY_CARE_PROVIDER_SITE_OTHER)

## 2024-06-03 DIAGNOSIS — K50118 Crohn's disease of large intestine with other complication: Secondary | ICD-10-CM

## 2024-06-05 ENCOUNTER — Ambulatory Visit: Payer: Self-pay | Admitting: Internal Medicine

## 2024-06-05 DIAGNOSIS — R1310 Dysphagia, unspecified: Secondary | ICD-10-CM

## 2024-06-05 DIAGNOSIS — K219 Gastro-esophageal reflux disease without esophagitis: Secondary | ICD-10-CM

## 2024-06-05 LAB — CALPROTECTIN, FECAL: Calprotectin, Fecal: 206 ug/g — ABNORMAL HIGH (ref 0–120)

## 2024-06-08 ENCOUNTER — Other Ambulatory Visit: Payer: Self-pay | Admitting: Internal Medicine

## 2024-06-08 ENCOUNTER — Other Ambulatory Visit

## 2024-06-08 DIAGNOSIS — E890 Postprocedural hypothyroidism: Secondary | ICD-10-CM

## 2024-06-08 NOTE — Telephone Encounter (Signed)
 Inbound call from patient requesting to speak to the nurse in regards to her medication. Patient stated that he meds are to big for her to take. Patient is requesting a call back. Please advise.

## 2024-06-09 ENCOUNTER — Telehealth: Payer: Self-pay

## 2024-06-09 ENCOUNTER — Ambulatory Visit: Payer: Self-pay | Admitting: Internal Medicine

## 2024-06-09 LAB — TSH: TSH: 3.27 m[IU]/L (ref 0.40–4.50)

## 2024-06-09 LAB — T4, FREE: Free T4: 1.1 ng/dL (ref 0.8–1.8)

## 2024-06-09 MED ORDER — MESALAMINE ER 0.375 G PO CP24: 1500.0000 mg | ORAL_CAPSULE | Freq: Every day | ORAL | 2 refills | Status: DC

## 2024-06-09 MED ORDER — LEVOTHYROXINE SODIUM 88 MCG PO TABS: 88.0000 ug | ORAL_TABLET | ORAL | 3 refills | Status: AC

## 2024-06-09 MED ORDER — LEVOTHYROXINE SODIUM 100 MCG PO TABS: 100.0000 ug | ORAL_TABLET | ORAL | 3 refills | Status: AC

## 2024-06-09 NOTE — Telephone Encounter (Signed)
 We have received a fax from [facility] pharmacy today stating: Patient is not comfortable taking big pills, this one cannot be crushed or split. The Rx they are talking about is her Mesalamine  1.2 gm that she takes daily with breakfast.   I will route to Dr Avram to advise.

## 2024-06-09 NOTE — Telephone Encounter (Signed)
 Please let her know I sent a different version of the medicine with smaller capsules to try  This is a 1 month prescription  - taking 4 capsules daily so #120

## 2024-06-10 NOTE — Telephone Encounter (Signed)
 I have addressed this in another thread with nursing staff.

## 2024-06-15 DIAGNOSIS — R1310 Dysphagia, unspecified: Secondary | ICD-10-CM | POA: Insufficient documentation

## 2024-06-15 MED ORDER — PANTOPRAZOLE SODIUM 40 MG PO TBEC: 40.0000 mg | DELAYED_RELEASE_TABLET | Freq: Every day | ORAL | Status: AC

## 2024-06-15 NOTE — Telephone Encounter (Signed)
 Spoke to patient.  She is taking mesalamine  1.2 g daily.  I inadvertently prescribed 1 2 point grams daily.  However I thought that tablet was too large for the capsule so I have prescribed generic Apriso  but she is not taking that because those capsules are too large.  She does have major issues with pill dysphagia.  She is also confident that second dose of pantoprazole  causes bloating.  She is back to once a day.   Dicyclomine  caused side effects as outlined in the MyChart message.  That is discontinued.  She has limited resources to purchase medications and taking to mesalamine  1.2 g daily may be cost prohibitive.   I will look into this further to see what we might be able to do.  She is not working right now.  Perhaps there is some financial assistance.

## 2024-06-15 NOTE — Addendum Note (Signed)
 Addended by: AVRAM LUPITA BRAVO on: 06/15/2024 05:15 PM   Modules accepted: Orders

## 2024-06-16 ENCOUNTER — Encounter: Payer: Self-pay | Admitting: Family Medicine

## 2024-06-16 ENCOUNTER — Ambulatory Visit: Payer: Self-pay

## 2024-06-16 ENCOUNTER — Ambulatory Visit: Admitting: Family Medicine

## 2024-06-16 ENCOUNTER — Telehealth: Payer: Self-pay | Admitting: Cardiovascular Disease

## 2024-06-16 VITALS — BP 138/89 | HR 87 | Temp 97.4°F | Resp 16 | Ht 63.0 in | Wt 203.2 lb

## 2024-06-16 DIAGNOSIS — J988 Other specified respiratory disorders: Secondary | ICD-10-CM | POA: Diagnosis not present

## 2024-06-16 DIAGNOSIS — J309 Allergic rhinitis, unspecified: Secondary | ICD-10-CM | POA: Diagnosis not present

## 2024-06-16 DIAGNOSIS — J4520 Mild intermittent asthma, uncomplicated: Secondary | ICD-10-CM | POA: Diagnosis not present

## 2024-06-16 MED ORDER — DOXYCYCLINE HYCLATE 100 MG PO TABS: 100.0000 mg | ORAL_TABLET | Freq: Two times a day (BID) | ORAL | 0 refills | Status: AC

## 2024-06-16 NOTE — Telephone Encounter (Signed)
 FYI Only or Action Required?: Action required by provider: request for appointment, clinical question for provider, and update on patient condition.  Patient was last seen in primary care on 04/23/2024 by Mercer Clotilda SAUNDERS, MD.  Called Nurse Triage reporting Hemoptysis.  Symptoms began several weeks ago.  Interventions attempted: Rest, hydration, or home remedies and Other: ED on 12/27.  Symptoms are: unchanged per pt, new headaches, burning in chest.  Triage Disposition: Call EMS 911 Now  Patient/caregiver understands and will follow disposition?: No, refuses disposition       Copied from CRM #[id number]. Topic: Clinical - Red Word Triage >> Jun 16, 2024  7:43 AM Harlene ORN wrote: Red Word that prompted transfer to Nurse Triage: Was in the ER on 12/27/ and was recommended to be seen in the next 3 to 5 days. For her upper Respiratory infection and coughing up blood. Heaviness in her chest.     Reason for Disposition  [1] Chest pain lasts > 5 minutes AND [2] described as crushing, pressure-like, or heavy  Answer Assessment - Initial Assessment Questions This RN recommended pt be examined in hospital, pt refusing. Advised pt call 911 or get to hospital asap if any new or worsening symptoms. Sending message to PCP office for call back to pt with further recommendations. Alerted [facility] to ED refusal.    ED on 12/27 with similar symptoms, was told to follow up with PCP and cardio within couple days, pt states difficult to get ahold of docs.   Per ED and pt, pt had abnormal heart rhythm on EKG. Pt attempting to contact cardio as well.  Symptoms: Coughing up blood - strings of blood Chest heaviness/pressure longer than 5 min at a time Headache 3/10 pain, headaches since yesterday Burning in chest any time take meds Pressure in ears Discolored mucus and sputum - clear/white/grayish/grayish-green  Denies: SOB (had when went to ER) Fever Hx blood clots in legs or lungs  Protocols  used: Chest Pain-A-AH

## 2024-06-16 NOTE — Telephone Encounter (Signed)
 Pt schedule for 1/19 appt for hospital f/u . Pt requesting call if she needs to be seen sooner.

## 2024-06-16 NOTE — Patient Instructions (Addendum)
 A few things to remember from today's visit:  Allergic rhinitis, unspecified seasonality, unspecified trigger  Mild intermittent asthma without complication  Cough and congestion can last a few more days and even weeks. Plain Mucinex  may help. Start Symbicort  2 puff 2 times daily for 3 weeks then as needed. Rinse after use. If not any better in 5 days you can take antibiotic. Rinse nose with saline water as needed. If symptoms are persistent in 2 weeks please arrange follow up appt with Dr Mercer.  If you need refills for medications you take chronically, please call your pharmacy. Do not use My Chart to request refills or for acute issues that need immediate attention. If you send a my chart message, it may take a few days to be addressed, specially if I am not in the office.  Please be sure medication list is accurate. If a new problem present, please set up appointment sooner than planned today.

## 2024-06-16 NOTE — Telephone Encounter (Signed)
 06/22/24 hospital is acceptable.

## 2024-06-16 NOTE — Progress Notes (Signed)
 "  ACUTE VISIT Chief Complaint  Patient presents with   Sinus Problem    Coughing - mucus coming out - ER Dec 27th but they did not give her any medication.    Discussed the use of AI scribe software for clinical note transcription with the patient, who gave verbal consent to proceed.  History of Present Illness Marilyn [name] is a 59 year old female with PMHx of Crohn's disease, HTN, DM II, GERD, allergic rhinitis, and asthma who presents with a persistent cough as described above.  She has experienced a persistent cough and mucus production since May 30, 2024. The cough is productive, with occasional strings of blood mixed with sputum and nasal discharge.  She experiences chills, body aches, and some shortness of breath, particularly when talking or moving around. No fever is reported, but she notes a runny nose and post-nasal drip. Her symptoms have slightly improved since the ER visit, but she reports ongoing pressure in her chest and sinus congestion. Evaluated in the ED on 05/30/24, dx'ed with a viral URI and Benzonatate  prescribed.  She has not been taking any specific medication for the respiratory symptoms. Asthma on albuterol  and Symbicort  80-4.5 mcg bid prn. She reports no wheezing   Review of Systems  Constitutional:  Positive for activity change, appetite change and fatigue.  HENT:  Negative for facial swelling, mouth sores and sore throat.   Cardiovascular:  Negative for chest pain, palpitations and leg swelling.  Gastrointestinal:  Negative for abdominal pain, nausea and vomiting.  Genitourinary:  Negative for decreased urine volume, dysuria and hematuria.  Skin:  Negative for rash.  Allergic/Immunologic: Positive for environmental allergies.  Neurological:  Negative for syncope and weakness.  See other pertinent positives and negatives in HPI.  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   Neck pain 09/09/2011   Obesity 04/11/2014   Pericardial effusion 02/07/2022   Sleep apnea    Allergies[2]  Social History   Socioeconomic History   Marital status: Single    Spouse name: Not on file   Number of children: 3   Years of education: Not on file   Highest education level: Some college, no degree  Occupational History   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[employer]    Employer: [employer]  Tobacco Use   Smoking status: Former    Current packs/day: 0.00    Average packs/day: 0.3 packs/day for 3.0 years (0.8 ttl pk-yrs)    Types: Cigarettes    Start date: 06/04/1977    Quit date: 06/04/1980    Years since quitting: 44.0    Passive exposure: Current (just out in public sometimes)   Smokeless tobacco: Never  Vaping Use   Vaping status: Never Used  Substance and Sexual Activity   Alcohol  use: No    Alcohol /week: 0.0 standard drinks of alcohol    Drug use: No  Sexual activity: Not Currently    Comment: 1st intercourse 44 yo-5 partners  Other Topics Concern   Not on file  Social History Narrative   Not on file   Social Drivers of Health   Tobacco Use: Medium Risk (06/16/2024)   Patient History    Smoking Tobacco Use: Former    Smokeless Tobacco Use: Never    Passive Exposure: Current  Physicist, Medical Strain: Medium Risk (12/01/2023)   Overall Financial Resource Strain (CARDIA)    Difficulty of Paying Living Expenses: Somewhat hard  Food Insecurity: Food Insecurity Present (10/16/2023)    Hunger Vital Sign    Worried About Running Out of Food in the Last Year: Sometimes true    Ran Out of Food in the Last Year: Sometimes true  Transportation Needs: No Transportation Needs (10/16/2023)   PRAPARE - Administrator, Civil Service (Medical): No    Lack of Transportation (Non-Medical): No  Physical Activity: Insufficiently Active (12/01/2023)   Exercise Vital Sign    Days of Exercise per Week: 2 days    Minutes of Exercise per Session: 30 min  Stress: No Stress Concern Present (12/01/2023)   [employer] of Occupational Health - Occupational Stress Questionnaire    Feeling of Stress: Not at all  Social Connections: Moderately Isolated (12/01/2023)   Social Connection and Isolation Panel    Frequency of Communication with Friends and Family: More than three times a week    Frequency of Social Gatherings with Friends and Family: More than three times a week    Attends Religious Services: More than 4 times per year    Active Member of Clubs or Organizations: No    Attends Banker Meetings: Not on file    Marital Status: Divorced  Depression (PHQ2-9): Low Risk (04/23/2024)   Depression (PHQ2-9)    PHQ-2 Score: 0  Alcohol  Screen: Low Risk (07/26/2021)   Alcohol  Screen    Last Alcohol  Screening Score (AUDIT): 0  Housing: Unknown (12/01/2023)   Epic    Unable to Pay for Housing in the Last Year: Not on file    Number of Times Moved in the Last Year: Not on file    Homeless in the Last Year: No  Utilities: Not on file  Health Literacy: Not on file   Vitals:   06/16/24 1044  BP: 138/89  Pulse: 87  Resp: 16  Temp: (!) 97.4 F (36.3 C)  SpO2: 98%   Body mass index is 36 kg/m.  Physical Exam Vitals and nursing note reviewed.  Constitutional:      General: She is not in acute distress.    Appearance: She is well-developed.  HENT:     Head: Normocephalic and atraumatic.     Right Ear: Tympanic membrane, ear canal and external ear normal.      Left Ear: Tympanic membrane, ear canal and external ear normal.     Nose: Congestion and rhinorrhea present.     Right Turbinates: Enlarged.     Left Turbinates: Enlarged.     Mouth/Throat:     Mouth: Mucous membranes are moist.     Pharynx: Oropharynx is clear. Postnasal drip present. No posterior oropharyngeal erythema.  Eyes:     Conjunctiva/sclera: Conjunctivae normal.  Cardiovascular:     Rate and Rhythm: Normal rate and regular rhythm.     Heart sounds: No murmur heard. Pulmonary:     Effort: Pulmonary effort is normal. No respiratory distress.     Breath sounds:  Normal breath sounds.  Lymphadenopathy:     Cervical: No cervical adenopathy.  Skin:    General: Skin is warm.     Findings: No erythema or rash.  Neurological:     General: No focal deficit present.     Mental Status: She is alert and oriented to person, place, and time.     Gait: Gait normal.  Psychiatric:        Mood and Affect: Mood and affect normal.    ASSESSMENT AND PLAN:  [name] was seen today for sinus problem.  Diagnoses and all orders for this visit:  Mild intermittent asthma without complication She reports SOB and wheezing, symptoms have improved some. Lung auscultation negative. She is not taking Symbicort  80-4.5 mcg , recommend starting 2 puff bid x 3 weeks then prn. F/U with PCP in 2 weeks of needed.  Allergic rhinitis, unspecified seasonality, unspecified trigger Can be contributing to her persistent cough. Recommend nasal saline irrigations as n eeded. Flonase  can aggravate nose bleeding.  Respiratory tract infection Most likely viral. CXR on 05/29/24:Negative. Chest CTA on 05/30/24: No acute pulmonary embolism. Lower lung volumes with atelectasis. No other acute or inflammatory process identified in the chest. Explained that cough and congestion can last a few more days and even weeks after acute symptoms have resolved. I do not think imaging needs to be  repeated. Plain Mucinex  may help. Adequate hydration. I do not think abx is needed at this time but can start Doxycycline  in 5 days if she is not feeling any better. Monitor for fever. Instructed about warning signs.  -     doxycycline  (VIBRA -TABS) 100 MG tablet; Take 1 tablet (100 mg total) by mouth 2 (two) times daily for 7 days.  Return if symptoms worsen or fail to improve.  Deaven Urwin G. Jazlyn Tippens, MD  [facility]. [place] office.     [1]  Current Outpatient Medications on File Prior to Visit  Medication Sig Dispense Refill   Accu-Chek Softclix Lancets lancets Test three times a day 100 each 12   acetaminophen  (TYLENOL ) 500 MG tablet Take 1 tablet (500 mg total) by mouth every 6 (six) hours as needed. 20 tablet 0   albuterol  (VENTOLIN  HFA) 108 (90 Base) MCG/ACT inhaler Inhale 1-2 puffs into the lungs every 6 (six) hours as needed for wheezing or shortness of breath. 8.5 g 0   amLODipine  (NORVASC ) 10 MG tablet TAKE 1 TABLET BY MOUTH DAILY 90 tablet 2   aspirin  EC 81 MG tablet Take 1 tablet (81 mg total) by mouth daily. Swallow whole. 90 tablet 3   benzonatate  (TESSALON ) 100 MG capsule Take 1 capsule (100 mg total) by mouth every 8 (eight) hours. 21 capsule 0   Blood Glucose Monitoring Suppl (ACCU-CHEK GUIDE) w/Device KIT TEST THREE TIMES A DAY 1 kit 0   Blood Pressure Monitoring (COMFORT TOUCH BP CUFF/LARGE) MISC Please measure blood pressure 1 time per day 1-2 hours after taking medication and resting for 5-10 minutes, please keep a log and bring to all future appointments. DX I10 1 each 0   budesonide -formoterol  (SYMBICORT ) 80-4.5 MCG/ACT inhaler Inhale 1-2 puffs into the lungs 2 (two) times daily. 1 each 3   cyclobenzaprine  (FLEXERIL ) 5 MG tablet Take 1 tablet (5 mg total) by mouth 3 (three) times daily as needed. 90 tablet 1   EPINEPHrine  (EPIPEN  2-PAK) 0.3 mg/0.3 mL IJ SOAJ injection Inject 0.3 mg into the muscle as needed for anaphylaxis. 1 each 0   glucose blood  test  strip Test three times a day 100 each 12   levothyroxine  (SYNTHROID ) 100 MCG tablet Take 1 tablet (100 mcg total) by mouth every other day. 45 tablet 3   levothyroxine  (SYNTHROID ) 88 MCG tablet Take 1 tablet (88 mcg total) by mouth every other day. 45 tablet 3   metoprolol  succinate (TOPROL -XL) 25 MG 24 hr tablet TAKE 1 TABLET BY MOUTH DAILY 90 tablet 0   ondansetron  (ZOFRAN ) 4 MG tablet Take 1 tablet (4 mg total) by mouth every 8 (eight) hours as needed for nausea or vomiting. 10 tablet 0   pantoprazole  (PROTONIX ) 40 MG tablet Take 1 tablet (40 mg total) by mouth daily.     vitamin B-12 (CYANOCOBALAMIN ) 500 MCG tablet Take 500 mcg by mouth daily.     atorvastatin  (LIPITOR) 20 MG tablet Take 1 tablet (20 mg total) by mouth daily. (Patient not taking: Reported on 06/16/2024) 90 tablet 3   levocetirizine (XYZAL  ALLERGY 24HR) 5 MG tablet Take 1 tablet (5 mg total) by mouth every evening. (Patient not taking: Reported on 06/16/2024) 30 tablet 3   mesalamine  (LIALDA ) 1.2 g EC tablet Take 1.2 g by mouth daily with breakfast. (Patient not taking: Reported on 06/16/2024)     Vitamin D -Vitamin K (VITAMIN K2-VITAMIN D3 PO) Take 1 capsule by mouth daily. (Patient not taking: Reported on 06/16/2024)     Wheat Dextrin (BENEFIBER) POWD Use as directed, daily (Patient not taking: Reported on 06/16/2024)     No current facility-administered medications on file prior to visit.  [2]  Allergies Allergen Reactions   Promethazine -Dm Anaphylaxis   Tuna [Fish Allergy] Anaphylaxis   Atorvastatin  Other (See Comments)    Muscle aches    Codeine Nausea And Vomiting   Lactulose  Other (See Comments)    Didn't work for me and I had a reaction as to made throat sore and scratchy with fine bumps.   Flonase  [Fluticasone  Propionate] Other (See Comments)    Nose bleeds   "

## 2024-06-16 NOTE — Telephone Encounter (Signed)
 Patient has appt with Dr. Jordan 1/13

## 2024-06-16 NOTE — Progress Notes (Unsigned)
 " Cardiology Office Note   Date:  06/22/2024  ID:  Bona Hubbard, DOB 12-May-1966, MRN [id number] PCP: Mercer Clotilda SAUNDERS, MD  [facility] Providers Cardiologist:  Annabella Scarce, MD { Click to update primary MD,subspecialty MD or APP then REFRESH:1}    PMH Hyperlipidemia Aortic atherosclerosis Hypertension OSA Obesity Hypothyroidism Coronary artery disease Coronary CTA 04/2020 CAC Score 0 Minimal nonobstructive   Coronary CTA 04/2020 LAD minimal nonobstructive (1-35%) plaque at take of D1 vessel and minimal nonobstructive narrowing (1-25%) in mid Cx. Sleep study 12/14/21 moderate ODSA AKI 28.5/hr overall and 36.3/hr during REM, O2 as low as 75%. PYP scan 03/2022 equivocal for amyloidosis. Subsequent coronary calcium  score 03/2023 of 0. Previously did not tolerate higher doses of atorvastatin . PYP scan 03/2022 not equivocal for amyloidosis.   Referred to pulmonary for management of sleep apnea. Prior chest pain attributed to GERD. Was given 3 day course of Lasix 20 mg daily for leg swelling 12/2023, she politely declined follow-up visit.   Contacted the office 04/2024 with questions regarding herbal medications and abdominal pain. Recommendation to avoid herbal agents due to potential interference with Rx meds and not well regulated.   Last cardiology clinic visit 05/04/24 with Reche Finder, NP at which time she attributed chest pain to a pulled muscle after taking medications, prior to them having time to be absorbed. Occurs immediately after taking meds. Recent GERD symptoms with dietary indiscretion over Thanksgiving. Having constipation, abdominal pain. BP well controlled, no hypotension. Self-discontinued atorvastatin  over the summer due to myalgias. She was taking a higher dose that what was prescribed. Encouraged her to resume atorvastatin  20 mg daily. Occasional PVCs on EKG. Noting palpitations when laying down at night described as feeling her heart beat in her  stomach. Only eating 2 meals per day and poor hydration felt likely contributing to lightheadedness. Had upcoming appointment with GI. 6 month cards f/u recommended.  ED visit 05/30/24 with left sided chest pain and cough. EKG and troponin reassuring with trop < 15 x 2. D-dimer mildly elevated, similar to past. CTA with no acute pulmonary embolus, lower lung volumes with atelectasis, no acute or inflammatory process.    History of Present Illness Discussed the use of AI scribe software for clinical note transcription with the patient, who gave verbal consent to proceed.  History of Present Illness Maridee Slape is a very pleasant 59 year old female who presents with chest pain and elevated heart rate. She describes squeezing chest pain with a pounding sensation that led to an emergency room visit. Resting heart rate was 116 bpm. She has had prior elevated heart rates up to 140 bpm. Ambulatory monitoring in 2023 showed mainly sinus rhythm with occasional early beats from the atria or ventricles and an average heart rate of 89 bpm. Her chest pressure and pounding heart rate occur with exertion such as walking or exercise but are not constant. No formal exercise.  She has leg and ankle swelling that she relates to recent medication changes. She also has intermittent severe leg pain that occurred after eating chili the night previous and resolved on its own. She stopped atorvastatin  because of muscle and leg pain and concerns about the medication. She was recently diagnosed with ulcerative colitis and has significant abdominal pain and medication intolerance, including with pantoprazole  and promethazine . Notes abdominal distention that has improved since discontinuing some of her medications including aspirin . History of esophageal dilation and difficulty swallowing large pills. Thyroid  function is stable and she alternates levothyroxine   100 mcg once daily with 88 mcg once daily every other day. She is  concerned about medication costs and side effects, especially their impact on her gastrointestinal symptoms.    ROS: See HPI  Studies Reviewed       No results found for: LIPOA  Risk Assessment/Calculations       STOP-Bang Score:     { Consider Dx Sleep Disordered Breathing or Sleep Apnea  ICD G47.33          :1}    Physical Exam VS:  BP 132/74 (BP Location: Right Arm, Patient Position: Sitting, Cuff Size: Large)   Pulse 80   Ht 5' 3 (1.6 m)   Wt 201 lb (91.2 kg)   LMP 02/21/2013   SpO2 95%   BMI 35.61 kg/m    Wt Readings from Last 3 Encounters:  06/22/24 201 lb (91.2 kg)  06/16/24 203 lb 3.2 oz (92.2 kg)  05/19/24 201 lb (91.2 kg)    GEN: Well nourished, well developed in no acute distress NECK: No JVD; No carotid bruits CARDIAC: ***RRR, no murmurs, rubs, gallops RESPIRATORY:  Clear to auscultation without rales, wheezing or rhonchi  ABDOMEN: Soft, non-tender, non-distended EXTREMITIES:  No edema; No deformity   ASSESSMENT AND PLAN ***    {Are you ordering a CV Procedure (e.g. stress test, cath, DCCV, TEE, etc)?   Press F2        :[id number]}  Dispo: ***  Signed, Rosaline Bane, NP-C "

## 2024-06-22 ENCOUNTER — Encounter (HOSPITAL_BASED_OUTPATIENT_CLINIC_OR_DEPARTMENT_OTHER): Payer: Self-pay | Admitting: Nurse Practitioner

## 2024-06-22 ENCOUNTER — Ambulatory Visit (INDEPENDENT_AMBULATORY_CARE_PROVIDER_SITE_OTHER): Admitting: Nurse Practitioner

## 2024-06-22 VITALS — BP 132/74 | HR 80 | Ht 63.0 in | Wt 201.0 lb

## 2024-06-22 DIAGNOSIS — R000 Tachycardia, unspecified: Secondary | ICD-10-CM | POA: Diagnosis not present

## 2024-06-22 DIAGNOSIS — E785 Hyperlipidemia, unspecified: Secondary | ICD-10-CM

## 2024-06-22 DIAGNOSIS — R002 Palpitations: Secondary | ICD-10-CM

## 2024-06-22 DIAGNOSIS — I2510 Atherosclerotic heart disease of native coronary artery without angina pectoris: Secondary | ICD-10-CM

## 2024-06-22 DIAGNOSIS — Z7189 Other specified counseling: Secondary | ICD-10-CM

## 2024-06-22 DIAGNOSIS — R198 Other specified symptoms and signs involving the digestive system and abdomen: Secondary | ICD-10-CM

## 2024-06-22 DIAGNOSIS — R0789 Other chest pain: Secondary | ICD-10-CM | POA: Diagnosis not present

## 2024-06-22 MED ORDER — NEXLETOL 180 MG PO TABS: 180.0000 mg | ORAL_TABLET | Freq: Every day | ORAL | 3 refills | Status: AC

## 2024-06-22 NOTE — Patient Instructions (Signed)
 Medication Instructions:   START Nexletol  Take 1 tablet (180 mg total) by mouth daily.  Do not pick up medication till we call you  *If you need a refill on your cardiac medications before your next appointment, please call your pharmacy*  Lab Work:  None ordered.  If you have labs (blood work) drawn today and your tests are completely normal, you will receive your results only by: MyChart Message (if you have MyChart) OR A paper copy in the mail If you have any lab test that is abnormal or we need to change your treatment, we will call you to review the results.  Testing/Procedures:  None ordered.  Follow-Up: At [facility], you and your health needs are our priority.  As part of our continuing mission to provide you with exceptional heart care, our providers are all part of one team.  This team includes your primary Cardiologist (physician) and Advanced Practice Providers or APPs (Physician Assistants and Nurse Practitioners) who all work together to provide you with the care you need, when you need it.  Your next appointment:   6 month(s)  Provider:   Annabella Scarce, MD, Rosaline Bane, NP, or Reche Finder, NP    We recommend signing up for the patient portal called MyChart.  Sign up information is provided on this After Visit Summary.  MyChart is used to connect with patients for Virtual Visits (Telemedicine).  Patients are able to view lab/test results, encounter notes, upcoming appointments, etc.  Non-urgent messages can be sent to your provider as well.   To learn more about what you can do with MyChart, go to [email or web address].   Other Instructions  Your physician wants you to follow-up in: 6 months.  You will receive a reminder letter in the mail two months in advance. If you don't receive a letter, please call our office to schedule the follow-up appointment.

## 2024-06-23 ENCOUNTER — Other Ambulatory Visit (HOSPITAL_COMMUNITY): Payer: Self-pay

## 2024-06-23 ENCOUNTER — Other Ambulatory Visit: Payer: Self-pay | Admitting: Family

## 2024-06-23 ENCOUNTER — Encounter: Payer: Self-pay | Admitting: Oncology

## 2024-06-23 ENCOUNTER — Telehealth: Payer: Self-pay | Admitting: Pharmacy Technician

## 2024-06-23 DIAGNOSIS — I493 Ventricular premature depolarization: Secondary | ICD-10-CM

## 2024-06-23 DIAGNOSIS — R002 Palpitations: Secondary | ICD-10-CM

## 2024-06-23 MED ORDER — METOPROLOL SUCCINATE ER 25 MG PO TB24: 25.0000 mg | ORAL_TABLET | Freq: Every day | ORAL | 0 refills | Status: AC

## 2024-06-23 NOTE — Telephone Encounter (Signed)
 Pharmacy Patient Advocate Encounter   Received notification from Physician's Office that prior authorization for Nexletol  is required/requested.   Insurance verification completed.   The patient is insured through [facility] ADVANTAGE/RX ADVANCE.   Per test claim: Refill too soon. PA is not needed at this time. Medication was filled 06/22/24. Next eligible fill date is 08/29/24.

## 2024-07-08 ENCOUNTER — Encounter: Payer: Self-pay | Admitting: Internal Medicine

## 2024-07-08 ENCOUNTER — Ambulatory Visit: Admitting: Internal Medicine

## 2024-07-08 VITALS — BP 130/78 | HR 81 | Ht 63.0 in | Wt 200.2 lb

## 2024-07-08 DIAGNOSIS — R1310 Dysphagia, unspecified: Secondary | ICD-10-CM

## 2024-07-08 DIAGNOSIS — K219 Gastro-esophageal reflux disease without esophagitis: Secondary | ICD-10-CM

## 2024-07-08 DIAGNOSIS — T887XXA Unspecified adverse effect of drug or medicament, initial encounter: Secondary | ICD-10-CM

## 2024-07-08 DIAGNOSIS — K50119 Crohn's disease of large intestine with unspecified complications: Secondary | ICD-10-CM

## 2024-07-08 NOTE — Progress Notes (Unsigned)
 "       Marilyn [name] 58 y.o. 1966/03/25 [id number]  Assessment & Plan:   Encounter Diagnoses  Name Primary?   Crohn's disease of colon with complication (HCC) Yes   GERD with stricture    Medication side effect    Pill dysphagia     Assessment and Plan    Crohn's disease of the colon Biopsy-proven, mild, well-controlled off therapy, improved bowel function, no acute exacerbation. - Recommended observation without new anti-inflammatory therapy. - Provided education on chronic nature and inflammation control. - Advised dietary modifications: whole foods, avoid processed foods. - Scheduled follow-up in several months, contact if symptoms worsen. - Deferred blood tests, not indicated.  Gastroesophageal reflux disease with esophageal stricture GERD with prior esophageal stricture, asymptomatic off acid suppression, risk of recurrence due to history and pill dysphagia. - Discussed pantoprazole 's role in preventing recurrence. - Recommended restarting pantoprazole  if reflux or dysphagia recurs. - Advised pantoprazole  administration with applesauce. - Discussed alternative formulations, noted insurance and cost barriers. - Instructed to monitor for recurrence and contact if symptoms return.  Adverse effects of mesalamine  Significant adverse effects from mesalamine : headache, chest discomfort, abdominal cramps, necessitating discontinuation. - Advised discontinuation of mesalamine  due to side effects. - Documented adverse reaction in medical record. - Discussed alternative therapies are higher-powered, more expensive, not indicated given mild disease and improvement.  Pill dysphagia Chronic pill dysphagia, previously required esophageal dilation, prefers non-pill or modified administration routes. - Recommended pantoprazole  with applesauce for swallowing. - Discussed alternative formulations, noted cost and insurance limitations. - Instructed to report recurrence or worsening  of dysphagia.  Patient is experiencing significant financial stressors after retiring last spring and I think that could be in play with regard to physical symptoms as some of the sound like panic attacks.  Question if that is the underlying issue for the symptoms as opposed to medication side effects.  For the time being we will attribute the symptoms to medication side effects.  We will monitor her off mesalamine .  She will restart her pantoprazole .     Subjective:   Chief Complaint: Crohn's colitis  HPI   Discussed the use of AI scribe software for clinical note transcription with the patient, who gave verbal consent to proceed.  History of Present Illness   Marilyn [name] is a 59 year old female with Crohn's disease of the colon who presents for follow-up after recent colonoscopy and adverse effects from mesalamine  therapy.  She was started on mesalamine  after this diagnosis taking only 1.2 g daily, there were cost problems and the prescription was incorrect.  She had a multitude of symptoms while taking it as outlined below and has stopped it.  She says she is feeling better.  Colonic Inflammation and Bowel Symptoms: - Colonoscopy in December 2025 revealed colitis with ulceration in the right colon - Biopsy results discussed; prior colonoscopy in March 2024 showed mild inflammation - Chronic constipation since youth, with previous episodes requiring emergency department visits and magnesium  citrate for relief - No recent severe constipation flares; bowel movements improving, described as getting back to normal with occasional small pebbles - One episode of diarrhea after consuming soup and aloe vera juice, perceived as a cleansing event - No blood in stool  Adverse Effects of Mesalamine  Therapy: - Mesalamine  initiated after December colonoscopy; only one tablet daily taken due to cost and prescription issues - Therapy discontinued on June 25, 2024, due to severe  headaches increasing in duration with each dose, chest pounding  described as like somebody was just scraping me, heart racing, abdominal cramps, and heightened abdominal sensations, especially at night - Swelling and pressure in the abdominal wall with sensation of 'little bumps' noted - Sensation of feeling everything in the abdomen, particularly when lying down at night, began after starting mesalamine  - Currently not taking mesalamine , Protonix , aspirin , B12, or Nexletol ; continues thyroid  medication, amlodipine , and methotrexate  Gastroesophageal Reflux Disease and Dysphagia: - History of gastroesophageal reflux disease with dysphagia and esophageal strictures, previously requiring three esophageal dilations - Difficulty swallowing pills; prefers medications that can be dissolved or opened in applesauce - Acid reflux and heartburn worsened while taking mesalamine  and omeprazole  - Tums provided relief but discontinued due to concerns about interactions with thyroid  medication - No current reflux symptoms or heartburn since stopping mesalamine  and omeprazole   Respiratory Symptoms: - Cough with phlegm containing blood present - No blood in bowel movements - CT angio chest on May 30, 2024, without lesions no pulmonary emboli     Allergies[1] Active Medications[2] 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   Aortic atherosclerosis 02/07/2022   Arthritis    ASTHMA 01/30/2007   Asthma    Breast tumor    right; benign   Chest pain 01/24/2017   CHEST PAIN 01/29/2009   Qualifier: Diagnosis of  By: Jimmy MD, Charlie Scarlet    Diabetes mellitus without complication (HCC) 5/21   Essential hypertension 07/26/2021   GERD 01/31/2007   Hemorrhoids    Hiatal hernia    Hyperlipidemia    Hypersomnolence 12/15/2012   HYPOTHYROIDISM 01/30/2007   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   Neck pain 09/09/2011   Obesity 04/11/2014   Pericardial effusion  02/07/2022   Sleep apnea    Past Surgical History:  Procedure Laterality Date   BREAST BIOPSY Right 12/2014   BREAST EXCISIONAL BIOPSY Right 2016   BREAST LUMPECTOMY WITH RADIOACTIVE SEED LOCALIZATION Right 02/25/2015   Procedure: BREAST LUMPECTOMY WITH RADIOACTIVE SEED LOCALIZATION;  Surgeon: Alm Angle, MD;  Location: [facility];  Service: General;  Laterality: Right;   BREAST REDUCTION SURGERY  1986   BREAST SURGERY     broken jaw     CARPAL TUNNEL RELEASE  2001   COLONOSCOPY     FRACTURE SURGERY     REDUCTION MAMMAPLASTY Bilateral    THYROIDECTOMY     TUBAL LIGATION  1987   UPPER GASTROINTESTINAL ENDOSCOPY     Social History   Social History Narrative   Single   Lives w/ son - 3 grown, 2 grandchildren   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   Review of Systems   Objective:   Physical Exam BP 130/78   Pulse 81   Ht 5' 3 (1.6 m)   Wt 200 lb 4 oz (90.8 kg)   LMP 02/21/2013   BMI 35.47 kg/m  Abd obese, soft NT Small SQ nodules upper abdomen     [  1]  Allergies Allergen Reactions   Promethazine -Dm Anaphylaxis   Tuna [Fish Allergy] Anaphylaxis   Atorvastatin  Other (See Comments)    Muscle aches    Codeine Nausea And Vomiting   Lactulose  Other (See Comments)    Didn't work for me and I had a reaction as to made throat sore and scratchy with fine bumps.   Flonase   [Fluticasone  Propionate] Other (See Comments)    Nose bleeds  [2]  Current Meds  Medication Sig   Accu-Chek Softclix Lancets lancets Test three times a day   albuterol  (VENTOLIN  HFA) 108 (90 Base) MCG/ACT inhaler Inhale 1-2 puffs into the lungs every 6 (six) hours as needed for wheezing or shortness of breath.   amLODipine  (NORVASC ) 10 MG tablet TAKE 1 TABLET BY MOUTH DAILY   Blood Glucose Monitoring Suppl (ACCU-CHEK GUIDE) w/Device KIT TEST THREE TIMES A DAY   Blood Pressure Monitoring (COMFORT TOUCH BP CUFF/LARGE) MISC Please measure blood pressure 1 time per day 1-2 hours after taking medication and resting for 5-10 minutes, please keep a log and bring to all future appointments. DX I10   budesonide -formoterol  (SYMBICORT ) 80-4.5 MCG/ACT inhaler Inhale 1-2 puffs into the lungs 2 (two) times daily.   EPINEPHrine  (EPIPEN  2-PAK) 0.3 mg/0.3 mL IJ SOAJ injection Inject 0.3 mg into the muscle as needed for anaphylaxis.   glucose blood test strip Test three times a day   levothyroxine  (SYNTHROID ) 100 MCG tablet Take 1 tablet (100 mcg total) by mouth every other day.   levothyroxine  (SYNTHROID ) 88 MCG tablet Take 1 tablet (88 mcg total) by mouth every other day.   metoprolol  succinate (TOPROL -XL) 25 MG 24 hr tablet Take 1 tablet (25 mg total) by mouth daily.   ondansetron  (ZOFRAN ) 4 MG tablet Take 1 tablet (4 mg total) by mouth every 8 (eight) hours as needed for nausea or vomiting.   Vitamin D -Vitamin K (VITAMIN K2-VITAMIN D3 PO) Take 1 capsule by mouth daily.   "

## 2024-07-08 NOTE — Patient Instructions (Addendum)
" °  VISIT SUMMARY: During your visit, we discussed your Crohn's disease, adverse effects from mesalamine  therapy, gastroesophageal reflux disease (GERD) with esophageal stricture, and pill dysphagia. Your Crohn's disease is currently well-controlled without therapy, and we addressed the adverse effects you experienced from mesalamine . We also reviewed your GERD management and discussed strategies for your pill dysphagia.  YOUR PLAN: CROHN'S DISEASE OF THE COLON: Your Crohn's disease is mild and currently well-controlled without therapy. Your bowel function has improved, and there are no acute exacerbations. -We will continue to observe without starting new anti-inflammatory therapy. -Follow a diet with whole foods and avoid processed foods. -We will schedule a follow-up in several months. Please contact us  if your symptoms worsen.  GASTROESOPHAGEAL REFLUX DISEASE WITH ESOPHAGEAL STRICTURE: You have a history of GERD with esophageal stricture, but you are currently asymptomatic off acid suppression therapy. -Take pantoprazole  with applesauce to help with swallowing. -Monitor for recurrence of symptoms and contact us  if they return.  ADVERSE EFFECTS OF MESALAMINE : You experienced significant adverse effects from mesalamine , including headaches, chest discomfort, and abdominal cramps, which led to discontinuation of the medication. -You should not take mesalamine  due to the side effects. -We have documented the adverse reaction in your medical record. -Alternative therapies are available but are not indicated at this time due to the mild nature of your disease and your improvement.  PILL DYSPHAGIA: You have chronic difficulty swallowing pills and have previously required esophageal dilation. -Take pantoprazole  with applesauce to help with swallowing. -Monitor for any recurrence or worsening of your difficulty swallowing and report it to us .  Message or call with concerns Follow-up 1 year  routinely      "

## 2024-07-09 ENCOUNTER — Other Ambulatory Visit (HOSPITAL_BASED_OUTPATIENT_CLINIC_OR_DEPARTMENT_OTHER): Payer: Self-pay | Admitting: Family

## 2024-07-09 DIAGNOSIS — K219 Gastro-esophageal reflux disease without esophagitis: Secondary | ICD-10-CM

## 2025-02-11 ENCOUNTER — Ambulatory Visit: Admitting: Internal Medicine
